# Patient Record
Sex: Female | Born: 1950
Health system: Southern US, Community
[De-identification: ages and names within clinical notes are randomized; demographics above are authoritative.]

## PROBLEM LIST (undated history)

## (undated) DIAGNOSIS — H9190 Unspecified hearing loss, unspecified ear: Secondary | ICD-10-CM

## (undated) DIAGNOSIS — Z86018 Personal history of other benign neoplasm: Secondary | ICD-10-CM

## (undated) DIAGNOSIS — J189 Pneumonia, unspecified organism: Secondary | ICD-10-CM

## (undated) DIAGNOSIS — K649 Unspecified hemorrhoids: Secondary | ICD-10-CM

## (undated) DIAGNOSIS — Z860100 Personal history of colon polyps, unspecified: Secondary | ICD-10-CM

## (undated) DIAGNOSIS — Z8669 Personal history of other diseases of the nervous system and sense organs: Secondary | ICD-10-CM

## (undated) DIAGNOSIS — J302 Other seasonal allergic rhinitis: Secondary | ICD-10-CM

## (undated) DIAGNOSIS — F419 Anxiety disorder, unspecified: Secondary | ICD-10-CM

## (undated) DIAGNOSIS — Z8719 Personal history of other diseases of the digestive system: Secondary | ICD-10-CM

## (undated) DIAGNOSIS — E559 Vitamin D deficiency, unspecified: Secondary | ICD-10-CM

## (undated) DIAGNOSIS — K5790 Diverticulosis of intestine, part unspecified, without perforation or abscess without bleeding: Secondary | ICD-10-CM

## (undated) DIAGNOSIS — I10 Essential (primary) hypertension: Secondary | ICD-10-CM

## (undated) DIAGNOSIS — M545 Low back pain, unspecified: Secondary | ICD-10-CM

## (undated) DIAGNOSIS — G9341 Metabolic encephalopathy: Secondary | ICD-10-CM

## (undated) DIAGNOSIS — N189 Chronic kidney disease, unspecified: Secondary | ICD-10-CM

## (undated) DIAGNOSIS — R51 Headache: Secondary | ICD-10-CM

## (undated) DIAGNOSIS — Z87440 Personal history of urinary (tract) infections: Secondary | ICD-10-CM

## (undated) DIAGNOSIS — Z973 Presence of spectacles and contact lenses: Secondary | ICD-10-CM

## (undated) DIAGNOSIS — K219 Gastro-esophageal reflux disease without esophagitis: Secondary | ICD-10-CM

## (undated) DIAGNOSIS — D649 Anemia, unspecified: Secondary | ICD-10-CM

## (undated) DIAGNOSIS — R413 Other amnesia: Secondary | ICD-10-CM

## (undated) DIAGNOSIS — Z8742 Personal history of other diseases of the female genital tract: Secondary | ICD-10-CM

## (undated) DIAGNOSIS — G4733 Obstructive sleep apnea (adult) (pediatric): Secondary | ICD-10-CM

## (undated) DIAGNOSIS — M1990 Unspecified osteoarthritis, unspecified site: Secondary | ICD-10-CM

## (undated) DIAGNOSIS — G8929 Other chronic pain: Secondary | ICD-10-CM

## (undated) DIAGNOSIS — M720 Palmar fascial fibromatosis [Dupuytren]: Secondary | ICD-10-CM

## (undated) HISTORY — PX: ABDOMINAL HYSTERECTOMY: SHX81

## (undated) HISTORY — DX: Anxiety disorder, unspecified: F41.9

## (undated) HISTORY — DX: Personal history of other diseases of the nervous system and sense organs: Z86.69

## (undated) HISTORY — PX: UPPER GI ENDOSCOPY: SHX6162

## (undated) HISTORY — PX: DENTAL SURGERY: SHX609

## (undated) HISTORY — PX: OTHER SURGICAL HISTORY: SHX169

## (undated) HISTORY — PX: FOOT SURGERY: SHX648

## (undated) HISTORY — DX: Personal history of other diseases of the female genital tract: Z87.42

## (undated) HISTORY — DX: Pneumonia, unspecified organism: J18.9

## (undated) HISTORY — DX: Anemia, unspecified: D64.9

## (undated) HISTORY — DX: Chronic kidney disease, unspecified: N18.9

## (undated) HISTORY — DX: Personal history of other benign neoplasm: Z86.018

## (undated) HISTORY — PX: BLADDER SUSPENSION: SHX72

## (undated) HISTORY — DX: Personal history of urinary (tract) infections: Z87.440

## (undated) HISTORY — PX: BACK SURGERY: SHX140

---

## 1999-12-17 ENCOUNTER — Encounter: Payer: Self-pay | Admitting: Gynecology

## 1999-12-17 ENCOUNTER — Encounter: Admission: RE | Admit: 1999-12-17 | Discharge: 1999-12-17 | Payer: Self-pay | Admitting: Gynecology

## 2000-05-28 ENCOUNTER — Inpatient Hospital Stay (HOSPITAL_COMMUNITY): Admission: EM | Admit: 2000-05-28 | Discharge: 2000-05-30 | Payer: Self-pay | Admitting: Internal Medicine

## 2000-05-28 ENCOUNTER — Encounter: Payer: Self-pay | Admitting: Emergency Medicine

## 2000-11-30 ENCOUNTER — Other Ambulatory Visit: Admission: RE | Admit: 2000-11-30 | Discharge: 2000-11-30 | Payer: Self-pay | Admitting: Gynecology

## 2001-05-25 ENCOUNTER — Encounter: Payer: Self-pay | Admitting: Gynecology

## 2001-05-25 ENCOUNTER — Encounter: Admission: RE | Admit: 2001-05-25 | Discharge: 2001-05-25 | Payer: Self-pay | Admitting: Gynecology

## 2002-01-31 ENCOUNTER — Other Ambulatory Visit: Admission: RE | Admit: 2002-01-31 | Discharge: 2002-01-31 | Payer: Self-pay | Admitting: Gynecology

## 2002-06-23 ENCOUNTER — Encounter: Payer: Self-pay | Admitting: Gynecology

## 2002-06-23 ENCOUNTER — Encounter: Admission: RE | Admit: 2002-06-23 | Discharge: 2002-06-23 | Payer: Self-pay | Admitting: Gynecology

## 2003-05-08 ENCOUNTER — Other Ambulatory Visit: Admission: RE | Admit: 2003-05-08 | Discharge: 2003-05-08 | Payer: Self-pay | Admitting: Gynecology

## 2003-08-03 ENCOUNTER — Encounter: Admission: RE | Admit: 2003-08-03 | Discharge: 2003-08-03 | Payer: Self-pay | Admitting: Gynecology

## 2004-07-22 ENCOUNTER — Other Ambulatory Visit: Admission: RE | Admit: 2004-07-22 | Discharge: 2004-07-22 | Payer: Self-pay | Admitting: Gynecology

## 2004-07-28 ENCOUNTER — Ambulatory Visit: Payer: Self-pay | Admitting: Internal Medicine

## 2004-09-02 ENCOUNTER — Encounter: Admission: RE | Admit: 2004-09-02 | Discharge: 2004-09-02 | Payer: Self-pay | Admitting: Gynecology

## 2005-04-10 ENCOUNTER — Ambulatory Visit: Payer: Self-pay | Admitting: Internal Medicine

## 2005-11-05 ENCOUNTER — Ambulatory Visit: Payer: Self-pay | Admitting: Internal Medicine

## 2006-02-04 ENCOUNTER — Other Ambulatory Visit: Admission: RE | Admit: 2006-02-04 | Discharge: 2006-02-04 | Payer: Self-pay | Admitting: Gynecology

## 2006-02-24 ENCOUNTER — Encounter: Admission: RE | Admit: 2006-02-24 | Discharge: 2006-02-24 | Payer: Self-pay | Admitting: Gynecology

## 2007-08-03 ENCOUNTER — Encounter: Payer: Self-pay | Admitting: *Deleted

## 2007-08-03 DIAGNOSIS — G43909 Migraine, unspecified, not intractable, without status migrainosus: Secondary | ICD-10-CM | POA: Insufficient documentation

## 2007-08-03 DIAGNOSIS — Z9189 Other specified personal risk factors, not elsewhere classified: Secondary | ICD-10-CM | POA: Insufficient documentation

## 2007-08-03 DIAGNOSIS — I10 Essential (primary) hypertension: Secondary | ICD-10-CM | POA: Insufficient documentation

## 2007-08-03 DIAGNOSIS — Z8619 Personal history of other infectious and parasitic diseases: Secondary | ICD-10-CM | POA: Insufficient documentation

## 2007-08-03 DIAGNOSIS — D259 Leiomyoma of uterus, unspecified: Secondary | ICD-10-CM | POA: Insufficient documentation

## 2007-08-03 DIAGNOSIS — F411 Generalized anxiety disorder: Secondary | ICD-10-CM | POA: Insufficient documentation

## 2007-08-03 DIAGNOSIS — F1021 Alcohol dependence, in remission: Secondary | ICD-10-CM | POA: Insufficient documentation

## 2007-08-03 DIAGNOSIS — Z87448 Personal history of other diseases of urinary system: Secondary | ICD-10-CM | POA: Insufficient documentation

## 2007-08-03 DIAGNOSIS — N809 Endometriosis, unspecified: Secondary | ICD-10-CM | POA: Insufficient documentation

## 2007-08-03 DIAGNOSIS — H9319 Tinnitus, unspecified ear: Secondary | ICD-10-CM | POA: Insufficient documentation

## 2007-09-12 ENCOUNTER — Encounter: Payer: Self-pay | Admitting: Internal Medicine

## 2007-10-18 ENCOUNTER — Encounter (INDEPENDENT_AMBULATORY_CARE_PROVIDER_SITE_OTHER): Payer: Self-pay | Admitting: *Deleted

## 2007-10-20 ENCOUNTER — Encounter: Payer: Self-pay | Admitting: Internal Medicine

## 2007-11-07 ENCOUNTER — Encounter: Payer: Self-pay | Admitting: Internal Medicine

## 2008-01-15 ENCOUNTER — Encounter: Payer: Self-pay | Admitting: Internal Medicine

## 2008-01-17 ENCOUNTER — Encounter: Payer: Self-pay | Admitting: Internal Medicine

## 2008-01-26 ENCOUNTER — Encounter: Payer: Self-pay | Admitting: Internal Medicine

## 2008-01-31 LAB — CONVERTED CEMR LAB: Pap Smear: NORMAL

## 2008-02-16 HISTORY — PX: COLONOSCOPY: SHX174

## 2008-04-02 ENCOUNTER — Encounter: Admission: RE | Admit: 2008-04-02 | Discharge: 2008-04-02 | Payer: Self-pay | Admitting: Gynecology

## 2008-04-02 ENCOUNTER — Ambulatory Visit: Payer: Self-pay | Admitting: Internal Medicine

## 2008-04-12 ENCOUNTER — Telehealth: Payer: Self-pay | Admitting: Internal Medicine

## 2008-04-22 ENCOUNTER — Encounter: Admission: RE | Admit: 2008-04-22 | Discharge: 2008-04-22 | Payer: Self-pay | Admitting: Orthopedic Surgery

## 2008-04-30 ENCOUNTER — Telehealth (INDEPENDENT_AMBULATORY_CARE_PROVIDER_SITE_OTHER): Payer: Self-pay | Admitting: *Deleted

## 2008-05-07 ENCOUNTER — Ambulatory Visit: Payer: Self-pay | Admitting: Internal Medicine

## 2008-05-07 ENCOUNTER — Telehealth: Payer: Self-pay | Admitting: Internal Medicine

## 2008-05-07 DIAGNOSIS — J069 Acute upper respiratory infection, unspecified: Secondary | ICD-10-CM | POA: Insufficient documentation

## 2008-05-08 ENCOUNTER — Encounter: Admission: RE | Admit: 2008-05-08 | Discharge: 2008-05-08 | Payer: Self-pay | Admitting: Internal Medicine

## 2008-05-10 ENCOUNTER — Telehealth: Payer: Self-pay | Admitting: Internal Medicine

## 2008-05-10 DIAGNOSIS — J984 Other disorders of lung: Secondary | ICD-10-CM | POA: Insufficient documentation

## 2008-05-23 ENCOUNTER — Ambulatory Visit: Payer: Self-pay | Admitting: Internal Medicine

## 2008-05-27 ENCOUNTER — Encounter: Payer: Self-pay | Admitting: Internal Medicine

## 2008-05-28 ENCOUNTER — Encounter: Payer: Self-pay | Admitting: Internal Medicine

## 2008-06-18 ENCOUNTER — Telehealth: Payer: Self-pay | Admitting: Internal Medicine

## 2008-07-16 ENCOUNTER — Ambulatory Visit (HOSPITAL_COMMUNITY): Admission: RE | Admit: 2008-07-16 | Discharge: 2008-07-16 | Payer: Self-pay | Admitting: Orthopedic Surgery

## 2008-09-10 ENCOUNTER — Telehealth: Payer: Self-pay | Admitting: Internal Medicine

## 2008-09-27 ENCOUNTER — Encounter: Admission: RE | Admit: 2008-09-27 | Discharge: 2008-09-27 | Payer: Self-pay | Admitting: Orthopedic Surgery

## 2008-11-23 ENCOUNTER — Encounter: Payer: Self-pay | Admitting: Internal Medicine

## 2008-12-25 ENCOUNTER — Telehealth: Payer: Self-pay | Admitting: Internal Medicine

## 2009-01-10 ENCOUNTER — Ambulatory Visit: Payer: Self-pay | Admitting: Internal Medicine

## 2009-01-14 ENCOUNTER — Encounter: Admission: RE | Admit: 2009-01-14 | Discharge: 2009-01-14 | Payer: Self-pay | Admitting: Internal Medicine

## 2009-01-16 ENCOUNTER — Encounter: Payer: Self-pay | Admitting: Internal Medicine

## 2009-01-18 ENCOUNTER — Telehealth: Payer: Self-pay | Admitting: Internal Medicine

## 2009-01-28 ENCOUNTER — Telehealth: Payer: Self-pay | Admitting: Internal Medicine

## 2009-01-30 ENCOUNTER — Ambulatory Visit: Payer: Self-pay | Admitting: Internal Medicine

## 2009-02-13 ENCOUNTER — Telehealth: Payer: Self-pay | Admitting: Internal Medicine

## 2009-02-18 ENCOUNTER — Telehealth: Payer: Self-pay | Admitting: Internal Medicine

## 2009-06-18 ENCOUNTER — Telehealth: Payer: Self-pay | Admitting: Internal Medicine

## 2009-08-15 ENCOUNTER — Ambulatory Visit: Payer: Self-pay | Admitting: Internal Medicine

## 2009-09-16 ENCOUNTER — Ambulatory Visit: Payer: Self-pay | Admitting: Internal Medicine

## 2009-09-18 ENCOUNTER — Telehealth: Payer: Self-pay | Admitting: Internal Medicine

## 2009-09-23 ENCOUNTER — Telehealth: Payer: Self-pay | Admitting: Internal Medicine

## 2009-09-26 ENCOUNTER — Ambulatory Visit: Payer: Self-pay | Admitting: Internal Medicine

## 2009-09-26 LAB — CONVERTED CEMR LAB
CO2: 34 meq/L — ABNORMAL HIGH (ref 19–32)
Chloride: 99 meq/L (ref 96–112)
Sodium: 142 meq/L (ref 135–145)

## 2009-09-27 ENCOUNTER — Ambulatory Visit: Payer: Self-pay | Admitting: Cardiology

## 2009-09-27 ENCOUNTER — Encounter: Payer: Self-pay | Admitting: Internal Medicine

## 2009-10-01 ENCOUNTER — Telehealth: Payer: Self-pay | Admitting: Internal Medicine

## 2009-10-03 ENCOUNTER — Encounter: Admission: RE | Admit: 2009-10-03 | Discharge: 2009-10-03 | Payer: Self-pay | Admitting: Internal Medicine

## 2009-10-04 ENCOUNTER — Telehealth: Payer: Self-pay | Admitting: Internal Medicine

## 2009-10-07 ENCOUNTER — Telehealth: Payer: Self-pay | Admitting: Internal Medicine

## 2009-10-14 ENCOUNTER — Encounter: Payer: Self-pay | Admitting: Internal Medicine

## 2009-10-21 ENCOUNTER — Encounter: Payer: Self-pay | Admitting: Internal Medicine

## 2009-10-22 ENCOUNTER — Ambulatory Visit (HOSPITAL_COMMUNITY): Admission: RE | Admit: 2009-10-22 | Discharge: 2009-10-22 | Payer: Self-pay | Admitting: Neurosurgery

## 2009-10-22 ENCOUNTER — Telehealth (INDEPENDENT_AMBULATORY_CARE_PROVIDER_SITE_OTHER): Payer: Self-pay | Admitting: *Deleted

## 2009-10-22 ENCOUNTER — Telehealth: Payer: Self-pay | Admitting: Internal Medicine

## 2009-10-23 ENCOUNTER — Inpatient Hospital Stay (HOSPITAL_COMMUNITY): Admission: RE | Admit: 2009-10-23 | Discharge: 2009-10-25 | Payer: Self-pay | Admitting: Neurosurgery

## 2009-11-07 ENCOUNTER — Telehealth: Payer: Self-pay | Admitting: Internal Medicine

## 2009-11-23 ENCOUNTER — Inpatient Hospital Stay (HOSPITAL_COMMUNITY): Admission: EM | Admit: 2009-11-23 | Discharge: 2009-11-26 | Payer: Self-pay | Admitting: Emergency Medicine

## 2009-11-23 ENCOUNTER — Encounter: Payer: Self-pay | Admitting: Internal Medicine

## 2009-11-23 ENCOUNTER — Ambulatory Visit: Payer: Self-pay | Admitting: Internal Medicine

## 2009-11-25 ENCOUNTER — Encounter: Payer: Self-pay | Admitting: Internal Medicine

## 2009-11-25 ENCOUNTER — Telehealth (INDEPENDENT_AMBULATORY_CARE_PROVIDER_SITE_OTHER): Payer: Self-pay | Admitting: *Deleted

## 2009-11-26 ENCOUNTER — Telehealth (INDEPENDENT_AMBULATORY_CARE_PROVIDER_SITE_OTHER): Payer: Self-pay | Admitting: *Deleted

## 2009-11-28 ENCOUNTER — Ambulatory Visit: Payer: Self-pay | Admitting: Internal Medicine

## 2009-11-28 ENCOUNTER — Telehealth: Payer: Self-pay | Admitting: Internal Medicine

## 2009-11-28 DIAGNOSIS — D649 Anemia, unspecified: Secondary | ICD-10-CM | POA: Insufficient documentation

## 2009-11-28 LAB — CONVERTED CEMR LAB
BUN: 20 mg/dL (ref 6–23)
Basophils Absolute: 0 10*3/uL (ref 0.0–0.1)
Chloride: 90 meq/L — ABNORMAL LOW (ref 96–112)
Glucose, Bld: 108 mg/dL — ABNORMAL HIGH (ref 70–99)
HCT: 33.6 % — ABNORMAL LOW (ref 36.0–46.0)
Lymphs Abs: 1.2 10*3/uL (ref 0.7–4.0)
MCHC: 35.2 g/dL (ref 30.0–36.0)
MCV: 92.9 fL (ref 78.0–100.0)
Monocytes Absolute: 0.8 10*3/uL (ref 0.1–1.0)
Platelets: 239 10*3/uL (ref 150.0–400.0)
Potassium: 3.3 meq/L — ABNORMAL LOW (ref 3.5–5.1)
RDW: 14.4 % (ref 11.5–14.6)

## 2009-12-02 ENCOUNTER — Ambulatory Visit: Payer: Self-pay | Admitting: Internal Medicine

## 2009-12-02 DIAGNOSIS — N183 Chronic kidney disease, stage 3 unspecified: Secondary | ICD-10-CM | POA: Insufficient documentation

## 2009-12-02 DIAGNOSIS — N2889 Other specified disorders of kidney and ureter: Secondary | ICD-10-CM | POA: Insufficient documentation

## 2009-12-02 DIAGNOSIS — R109 Unspecified abdominal pain: Secondary | ICD-10-CM | POA: Insufficient documentation

## 2009-12-02 DIAGNOSIS — N189 Chronic kidney disease, unspecified: Secondary | ICD-10-CM

## 2009-12-05 ENCOUNTER — Encounter: Payer: Self-pay | Admitting: Internal Medicine

## 2009-12-09 ENCOUNTER — Telehealth: Payer: Self-pay | Admitting: Internal Medicine

## 2009-12-11 ENCOUNTER — Ambulatory Visit: Payer: Self-pay | Admitting: Internal Medicine

## 2009-12-11 ENCOUNTER — Telehealth: Payer: Self-pay | Admitting: Internal Medicine

## 2009-12-11 LAB — CONVERTED CEMR LAB
Calcium: 9.4 mg/dL (ref 8.4–10.5)
GFR calc non Af Amer: 47.13 mL/min (ref >60)
Glucose, Bld: 81 mg/dL (ref 70–99)
Ketones, ur: NEGATIVE mg/dL
Leukocytes, UA: NEGATIVE
Sodium: 142 meq/L (ref 135–145)
Specific Gravity, Urine: >=1.03 (ref 1.000–1.030)
Urobilinogen, UA: 0.2 (ref 0.0–1.0)

## 2009-12-17 ENCOUNTER — Telehealth: Payer: Self-pay | Admitting: Internal Medicine

## 2009-12-24 ENCOUNTER — Encounter: Payer: Self-pay | Admitting: Internal Medicine

## 2010-01-23 ENCOUNTER — Encounter: Payer: Self-pay | Admitting: Internal Medicine

## 2010-02-05 ENCOUNTER — Telehealth: Payer: Self-pay | Admitting: Internal Medicine

## 2010-02-12 ENCOUNTER — Encounter: Payer: Self-pay | Admitting: Internal Medicine

## 2010-02-13 ENCOUNTER — Telehealth: Payer: Self-pay | Admitting: Internal Medicine

## 2010-03-19 ENCOUNTER — Encounter: Admission: RE | Admit: 2010-03-19 | Discharge: 2010-03-19 | Payer: Self-pay | Admitting: Gynecology

## 2010-05-12 ENCOUNTER — Telehealth (INDEPENDENT_AMBULATORY_CARE_PROVIDER_SITE_OTHER): Payer: Self-pay | Admitting: *Deleted

## 2010-05-14 ENCOUNTER — Ambulatory Visit
Admission: RE | Admit: 2010-05-14 | Discharge: 2010-05-14 | Payer: Self-pay | Source: Home / Self Care | Attending: Internal Medicine | Admitting: Internal Medicine

## 2010-05-14 DIAGNOSIS — I872 Venous insufficiency (chronic) (peripheral): Secondary | ICD-10-CM | POA: Insufficient documentation

## 2010-05-25 ENCOUNTER — Encounter: Payer: Self-pay | Admitting: Internal Medicine

## 2010-05-29 ENCOUNTER — Encounter: Payer: Self-pay | Admitting: Internal Medicine

## 2010-05-30 ENCOUNTER — Encounter
Admission: RE | Admit: 2010-05-30 | Discharge: 2010-05-30 | Payer: Self-pay | Source: Home / Self Care | Attending: Neurosurgery | Admitting: Neurosurgery

## 2010-06-03 NOTE — Progress Notes (Signed)
  Phone Note Other Incoming   Request: Send information Summary of Call: Request for records received from [facility]. Request forwarded to [facility].     

## 2010-06-03 NOTE — Progress Notes (Signed)
Summary: REQ A CALL FROM MD  Phone Note Call from Patient Call back at Home Phone [phone] Call back at Work Phone [phone] Call back at [phone]   Summary of Call: Patient is requesting a call from Dr Debby Bud, c/o continued back pain.  Initial call taken by: Lamar Sprinkles, CMA,  Oct 01, 2009 2:11 PM  Follow-up for Phone Call        called patient - still in a lot of pain. I told her we were working on getting the MRI scheduled.  Follow-up by: Jacques Navy MD,  Oct 01, 2009 6:19 PM  Additional Follow-up for Phone Call Additional follow up Details #1::        Pt scheduled for MRI per [facility] at 3:15 today Additional Follow-up by: Lamar Sprinkles, CMA,  October 02, 2009 2:51 PM

## 2010-06-03 NOTE — Letter (Signed)
Summary: [facility]  [facility]   Imported By: Sherian Rein 02/22/2010 10:14:35  _____________________________________________________________________  External Attachment:    Type:   Image     Comment:   External Document

## 2010-06-03 NOTE — Consult Note (Signed)
Summary: [facility]  [facility]   Imported By: Sherian Rein 10/30/2009 13:41:06  _____________________________________________________________________  External Attachment:    Type:   Image     Comment:   External Document

## 2010-06-03 NOTE — Progress Notes (Signed)
Summary: FYI--Mass  Phone Note Call from Patient Call back at Home Phone [phone] Call back at Work Phone [phone] Call back at [phone]   Summary of Call: Patient left message on triage with FYI that she has back surgery tomorrow and while doing pre-op testing for the surgery a mass was found in her lung. Patient notes that she has done additional testing due to the mass, but is unaware of the results at this time. Patient would like Sharnee Douglass to have access to this information and is willing to sign release if needed. Initial call taken by: Lucious Groves,  October 22, 2009 10:55 AM  Follow-up for Phone Call        reveiwed CT chest - no nodule to correspond to finding on chest x-ray. No new nodules or abnormalities noted. Not too worry Follow-up by: Jacques Navy MD,  October 22, 2009 1:24 PM  Additional Follow-up for Phone Call Additional follow up Details #1::        Pt informed, Dr Cassandria Santee office is aware of normal results also and they will contact pt to confirm surgery tomorrow. Additional Follow-up by: Lamar Sprinkles, CMA,  October 22, 2009 1:38 PM

## 2010-06-03 NOTE — Assessment & Plan Note (Signed)
Summary: BACK PAIN,MEN PT/WILL SEE ANOTHER MD WANTS TO BE SEEN/CD   Vital Signs:  Patient profile:   60 year old female Height:      64.5 inches Weight:      144.13 pounds BMI:     24.45 O2 Sat:      96 % on Room air Temp:     97.8 degrees F oral Pulse rate:   72 / minute BP sitting:   122 / 80  (left arm) Cuff size:   regular  Vitals Entered ByZella Ball Ewing (August 15, 2009 9:21 AM)  O2 Flow:  Room air CC: Back Pain/RE   Primary Care Provider:  Jacques Navy MD  CC:  Back Pain/RE.  History of Present Illness: here with known lumbar disc disease and mild spinal stenosis;  last Sun apr 10 lifted her grand hild 20 lbs  (7 mo old)  out of a car seat out of a car;p did not feel any problem at the time, and walked fine that day; but Mon apr 11 had onset severe pain, lower back bilat but more worse on the right than the left;  assoc with constant ache as well to the right buttock and thigh;  no pain below the knee;  no numbness, no weakness , no falls, no change in bowel or bladder, no fever, chills, no night sweats, no wt loss. No injury , or falls.  Supposed to leave for vacation on sunday next (flying).   Pt denies CP, sob, doe, wheezing, orthopnea, pnd, worsening LE edema, palps, dizziness or syncope .   Problems Prior to Update: 1)  Lumbar Radiculopathy, Right  (ICD-724.4) 2)  Pulmonary Nodule  (ICD-518.89) 3)  Uri  (ICD-465.9) 4)  Hip Pain, Right  (ICD-719.45) 5)  Tinnitus, Left  (ICD-388.30) 6)  Anxiety  (ICD-300.00) 7)  Hypertension  (ICD-401.9) 8)  Uti's, Hx of  (ICD-V13.00) 9)  Alcohol Abuse, Hx of  (ICD-V11.3) 10)  Migraine Headache  (ICD-346.90) 11)  Measles, Hx of  (ICD-V12.09) 12)  Chickenpox, Hx of  (ICD-V15.9) 13)  Hx of Endometriosis  (ICD-617.9) 14)  Hx of Fibroids, Uterus  (ICD-218.9) 15)  Hx of Tah and Bilateral Salpingo-oophorectomy  () 16)  Hx of Laparoscopy For Evaluation of Endometriosis  ()  Medications Prior to Update: 1)  Paroxetine Hcl 40 Mg   Tabs (Paroxetine Hcl) .... Take Two Times A Day 2)  Atenolol 100 Mg  Tabs (Atenolol) .... Take Once Daily 3)  Vitamin C 250 Mg  Tabs (Ascorbic Acid) .... Take Once Daily As Needed 4)  Omega-3 1000 Mg Caps (Omega-3 Fatty Acids) .... Take 1 Tablet By Mouth Once A Day 5)  Glucosamine Complex   Tabs (Nutritional Supplements) .... Take Daily As Directed. 6)  Imitrex 100 Mg  Tabs (Sumatriptan Succinate) .... Take As Needed and As Directed 7)  Excedrin Migraine 250-250-65 Mg  Tabs (Aspirin-Acetaminophen-Caffeine) .... Take As Needed and As Directed. 8)  Multivitamins   Tabs (Multiple Vitamin) .... Take 1 Tablet By Mouth Once A Day 9)  Vitamin D 1000 Unit Caps (Cholecalciferol) .... Take 1 Tablet By Mouth Once A Day 10)  Zinc 50 Mg Tabs (Zinc) .... Take 1 Tablet By Mouth Once A Day 11)  Evamist (Estrogen) .... One Spray Daily 12)  Calcium-Magnesium 500-250 Mg Tabs (Calcium-Magnesium) .... Take 1 Tablet By Mouth Once A Day 13)  Vitamin B-12 250 Mcg Tabs (Cyanocobalamin) .... Take 1 Tablet By Mouth Once A Day 14)  Mucinex 600 Mg  Xr12h-Tab (Guaifenesin) .... Take 2 Tablet By Mouth Two Times A Day 15)  Diovan Hct 320-25 Mg Tabs (Valsartan-Hydrochlorothiazide) .[name].. 1 Once Daily 16)  Promethazine-Codeine 6.25-10 Mg/58ml Syrp (Promethazine-Codeine) .[name].. 1 Tsp Q 6 17)  Diclofenac Sodium 50 Mg Tbec (Diclofenac Sodium) .... One By Mouth Qid With Food As Needed For Lbp 18)  Nucynta 75 Mg Tabs (Tapentadol Hcl) .... One By Mouth Qid As Needed For Lbp Fill On or After 07/16/2008  Current Medications (verified): 1)  Paroxetine Hcl 40 Mg  Tabs (Paroxetine Hcl) .... Take Two Times A Day 2)  Atenolol 100 Mg  Tabs (Atenolol) .... Take Once Daily 3)  Vitamin C 250 Mg  Tabs (Ascorbic Acid) .... Take Once Daily As Needed 4)  Omega-3 1000 Mg Caps (Omega-3 Fatty Acids) .... Take 1 Tablet By Mouth Once A Day 5)  Glucosamine Complex   Tabs (Nutritional Supplements) .... Take Daily As Directed. 6)  Imitrex 100 Mg  Tabs  (Sumatriptan Succinate) .... Take As Needed and As Directed 7)  Excedrin Migraine 250-250-65 Mg  Tabs (Aspirin-Acetaminophen-Caffeine) .... Take As Needed and As Directed. 8)  Multivitamins   Tabs (Multiple Vitamin) .... Take 1 Tablet By Mouth Once A Day 9)  Vitamin D 1000 Unit Caps (Cholecalciferol) .... Take 1 Tablet By Mouth Once A Day 10)  Zinc 50 Mg Tabs (Zinc) .... Take 1 Tablet By Mouth Once A Day 11)  Evamist (Estrogen) .... One Spray Daily 12)  Calcium-Magnesium 500-250 Mg Tabs (Calcium-Magnesium) .... Take 1 Tablet By Mouth Once A Day 13)  Vitamin B-12 250 Mcg Tabs (Cyanocobalamin) .... Take 1 Tablet By Mouth Once A Day 14)  Mucinex 600 Mg Xr12h-Tab (Guaifenesin) .... Take 2 Tablet By Mouth Two Times A Day 15)  Diovan Hct 320-25 Mg Tabs (Valsartan-Hydrochlorothiazide) .[name].. 1 Once Daily 16)  Promethazine-Codeine 6.25-10 Mg/64ml Syrp (Promethazine-Codeine) .[name].. 1 Tsp Q 6 17)  Diclofenac Sodium 50 Mg Tbec (Diclofenac Sodium) .... One By Mouth Qid With Food As Needed For Lbp 18)  Oxycodone Hcl 5 Mg Caps (Oxycodone Hcl) .[name].. 1 -2 By Mouth Q 6 Hrs As Needed Pain 19)  Flexeril 5 Mg Tabs (Cyclobenzaprine Hcl) .[name].. 1 By Mouth Three Times A Day As Needed 20)  Prednisone 10 Mg Tabs (Prednisone) .... 3po Qd For 3days, Then 2po Qd For 3days, Then 1po Qd For 3days, Then Stop  Allergies (verified): 1)  ! Penicillin  Past History:  Past Medical History: Last updated: 08/03/2007 TINNITUS, LEFT (ICD-388.30) ANXIETY (ICD-300.00) HYPERTENSION (ICD-401.9) UTI'S, HX OF (ICD-V13.00) ALCOHOL ABUSE, HX OF (ICD-V11.3) MIGRAINE HEADACHE (ICD-346.90) MEASLES, HX OF (ICD-V12.09) CHICKENPOX, HX OF (ICD-V15.9) Hx of ENDOMETRIOSIS (ICD-617.9) Hx of FIBROIDS, UTERUS (ICD-218.9)  Past Surgical History: Last updated: 04/02/2008 * Hx of TAH AND BILATERAL SALPINGO-OOPHORECTOMY * Hx of LAPAROSCOPY FOR EVALUATION OF ENDOMETRIOSIS  G3P2  1 SAB    Social History: Last updated: 05/07/2008 [employer]- BS  interior design Married '79-20 years/divorced, has remained single 1 daughter '84 -lawyer; 1 son -'67 patient lives alone Denies anyhistory or physical or sexual abuse.  Risk Factors: Exercise: yes (04/02/2008)  Risk Factors: Smoking Status: never (08/03/2007)  Review of Systems       all otherwise negative per pt -    Physical Exam  General:  alert and well-developed.   Head:  normocephalic and atraumatic.   Eyes:  vision grossly intact, pupils equal, and pupils round.   Ears:  R ear normal and L ear normal.   Nose:  no external deformity and no  nasal discharge.   Mouth:  no gingival abnormalities and pharynx pink and moist.   Neck:  supple and no masses.   Lungs:  normal respiratory effort and normal breath sounds.   Heart:  normal rate and regular rhythm.   Abdomen:  soft, non-tender, and normal bowel sounds.   Msk:  no spine tender midline or paravertebral swelling;  has marked tender to the right buttock / SI joint area Extremities:  no edema, no erythema  Neurologic:  cranial nerves II-XII intact, strength normal in all lower extremities, and sensation intact to light touch.  cranial nerves II-XII intact, strength normal in all extremities, sensation intact to light touch, gait normal, and DTRs symmetrical and normal.     Impression & Recommendations:  Problem # 1:  LUMBAR RADICULOPATHY, RIGHT (ICD-724.4)  Her updated medication list for this problem includes:    Excedrin Migraine 250-250-65 Mg Tabs (Aspirin-acetaminophen-caffeine) .[name]... Take as needed and as directed.    Diclofenac Sodium 50 Mg Tbec (Diclofenac sodium) ..... One by mouth qid with food as needed for lbp    Oxycodone Hcl 5 Mg Caps (Oxycodone hcl) .[name]... 1 -2 by mouth q 6 hrs as needed pain    Flexeril 5 Mg Tabs (Cyclobenzaprine hcl) .[name]... 1 by mouth three times a day as needed with recurrent pain, exam stable,  for pain med/control, flexeril as needed, and pred pack; to f/u any worsening  symptoms  Problem # 2:  HYPERTENSION (ICD-401.9)  Her updated medication list for this problem includes:    Atenolol 100 Mg Tabs (Atenolol) .[name]... Take once daily    Diovan Hct 320-25 Mg Tabs (Valsartan-hydrochlorothiazide) .[name]... 1 once daily  BP today: 122/80 Prior BP: 118/78 (01/10/2009) stable overall by hx and exam, ok to continue meds/tx as is   Complete Medication List: 1)  Paroxetine Hcl 40 Mg Tabs (Paroxetine hcl) .... Take two times a day 2)  Atenolol 100 Mg Tabs (Atenolol) .... Take once daily 3)  Vitamin C 250 Mg Tabs (Ascorbic acid) .... Take once daily as needed 4)  Omega-3 1000 Mg Caps (Omega-3 fatty acids) .... Take 1 tablet by mouth once a day 5)  Glucosamine Complex Tabs (Nutritional supplements) .... Take daily as directed. 6)  Imitrex 100 Mg Tabs (Sumatriptan succinate) .... Take as needed and as directed 7)  Excedrin Migraine 250-250-65 Mg Tabs (Aspirin-acetaminophen-caffeine) .... Take as needed and as directed. 8)  Multivitamins Tabs (Multiple vitamin) .... Take 1 tablet by mouth once a day 9)  Vitamin D 1000 Unit Caps (Cholecalciferol) .... Take 1 tablet by mouth once a day 10)  Zinc 50 Mg Tabs (Zinc) .... Take 1 tablet by mouth once a day 11)  Evamist (estrogen)  .... One spray daily 12)  Calcium-magnesium 500-250 Mg Tabs (Calcium-magnesium) .... Take 1 tablet by mouth once a day 13)  Vitamin B-12 250 Mcg Tabs (Cyanocobalamin) .... Take 1 tablet by mouth once a day 14)  Mucinex 600 Mg Xr12h-tab (Guaifenesin) .... Take 2 tablet by mouth two times a day 15)  Diovan Hct 320-25 Mg Tabs (Valsartan-hydrochlorothiazide) .[name].. 1 once daily 16)  Promethazine-codeine 6.25-10 Mg/4ml Syrp (Promethazine-codeine) .[name].. 1 tsp q 6 17)  Diclofenac Sodium 50 Mg Tbec (Diclofenac sodium) .... One by mouth qid with food as needed for lbp 18)  Oxycodone Hcl 5 Mg Caps (Oxycodone hcl) .[name].. 1 -2 by mouth q 6 hrs as needed pain 19)  Flexeril 5 Mg Tabs (Cyclobenzaprine hcl) .[name].. 1 by mouth  three times a day  as needed 20)  Prednisone 10 Mg Tabs (Prednisone) .... 3po qd for 3days, then 2po qd for 3days, then 1po qd for 3days, then stop  Patient Instructions: 1)  Please take all new medications as prescribed 2)  Continue all previous medications as before this visit  3)  Please schedule an appointment with your primary doctor as needed Prescriptions: PREDNISONE 10 MG TABS (PREDNISONE) 3po qd for 3days, then 2po qd for 3days, then 1po qd for 3days, then stop  #18 x 0   Entered and Authorized by:   Corwin Levins MD   Signed by:   Corwin Levins MD on 08/15/2009   Method used:   Print then Give to Patient   RxID:   [id number] FLEXERIL 5 MG TABS (CYCLOBENZAPRINE HCL) 1 by mouth three times a day as needed  #60 x 0   Entered and Authorized by:   Corwin Levins MD   Signed by:   Corwin Levins MD on 08/15/2009   Method used:   Print then Give to Patient   RxID:   [id number] OXYCODONE HCL 5 MG CAPS (OXYCODONE HCL) 1 -2 by mouth q 6 hrs as needed pain  #60 x 0   Entered and Authorized by:   Corwin Levins MD   Signed by:   Corwin Levins MD on 08/15/2009   Method used:   Print then Give to Patient   RxID:   [id number]

## 2010-06-03 NOTE — Letter (Signed)
Summary: [facility]  [facility]   Imported By: Sherian Rein 10/30/2009 14:02:48  _____________________________________________________________________  External Attachment:    Type:   Image     Comment:   External Document

## 2010-06-03 NOTE — Progress Notes (Signed)
  Phone Note Refill Request Message from:  Fax from Pharmacy on October 07, 2009 8:30 AM  Refills Requested: Medication #1:  PAROXETINE HCL 40 MG  TABS Take two times a day Initial call taken by: Ami Bullins CMA,  October 07, 2009 8:30 AM    Prescriptions: PAROXETINE HCL 40 MG  TABS (PAROXETINE HCL) Take two times a day  #180 x 3   Entered by:   Ami Bullins CMA   Authorized by:   Jacques Navy MD   Signed by:   Bill Salinas CMA on 10/07/2009   Method used:   [facility]        [facility] [address] [facility] [address] [phone]* (retail)       [address]       [place], [place]  [address]       Ph: [phone] or [phone]       Fax: [phone]   RxID:   [id number]

## 2010-06-03 NOTE — Progress Notes (Signed)
  Phone Note Other Incoming   Request: Send information Summary of Call: Request for records received from [facility]. Request &  POA forwarded to [facility].

## 2010-06-03 NOTE — Progress Notes (Signed)
Summary: atenolol  Phone Note Refill Request Message from:  Fax from Pharmacy on Sep 18, 2009 11:23 AM  Refills Requested: Medication #1:  ATENOLOL 100 MG  TABS Take once daily # 90   Last Refilled: 07/18/2009  Method Requested: Electronic Initial call taken by: Orlan Leavens,  Sep 18, 2009 11:23 AM    Prescriptions: ATENOLOL 100 MG  TABS (ATENOLOL) Take once daily  #90 x 3   Entered by:   Orlan Leavens   Authorized by:   Jacques Navy MD   Signed by:   Orlan Leavens on 09/18/2009   Method used:   [facility]        [facility] [facility] [address] [phone]* (retail)       [address]       [place], [place]  [address]       Ph: [phone] or [phone]       Fax: [phone]   RxID:   [id number]

## 2010-06-03 NOTE — Progress Notes (Signed)
Summary: TRAZODONE  Phone Note Call from Patient Call back at Home Phone [phone] Call back at [phone]   Summary of Call: Patient is requesting a call back regarding info that we will be recieving from Dr Jeanie Sewer.  Initial call taken by: Lamar Sprinkles, CMA,  February 13, 2010 2:29 PM  Follow-up for Phone Call        Pt was at Dr Redding's office for a 5:30 apt and it was after 6:30 and MD still had another patient infront of her. She did not want to wait and left the office.   Patient is requesting that Dr Debby Bud take over prescribing trazodone? She has already signed a release for her records from Dr Jeanie Sewer to be transferred to Dr Debby Bud.  Follow-up by: Lamar Sprinkles, CMA,  February 14, 2010 3:21 PM  Additional Follow-up for Phone Call Additional follow up Details #1::        OK Additional Follow-up by: Jacques Navy MD,  February 14, 2010 5:45 PM    Additional Follow-up for Phone Call Additional follow up Details #2::    Patient notified and rx sent in.[name] CMA  February 17, 2010 4:30 PM   Prescriptions: TRAZODONE HCL 100 MG TABS (TRAZODONE HCL) 1 tab at bedtime  #30 x 5   Entered by:   Rock Nephew CMA   Authorized by:   Jacques Navy MD   Signed by:   Rock Nephew CMA on 02/17/2010   Method used:   [facility]        [facility] [address] [phone]* (retail)       [address]       [place], [place]  [address]       Ph: [phone] or [phone]       Fax: [phone]   RxID:   [id number]

## 2010-06-03 NOTE — Progress Notes (Signed)
Summary: lab results  Phone Note Outgoing Call   Reason for Call: Discuss lab or test results Summary of Call: please call patient. Creatinine is 1.2 normal range but still up a little from labs in May when creatinine was 0.7. Also, U/A wth specific gravity of 1.030 consistent with mild dehydration.  Initial call taken by: Jacques Navy MD,  December 17, 2009 1:58 PM  Follow-up for Phone Call        tried to call pt with no answer on either numbers. will try back later Follow-up by: Ami Bullins CMA,  December 17, 2009 2:13 PM  Additional Follow-up for Phone Call Additional follow up Details #1::        Any recommendations for the patient/ Additional Follow-up by: Lucious Groves CMA,  December 23, 2009 1:30 PM    Additional Follow-up for Phone Call Additional follow up Details #2::    no further recommendations at this time. She will need follow-up lab in 3 weeks.: metabolic panel 584.9 Follow-up by: Jacques Navy MD,  December 23, 2009 4:58 PM  Additional Follow-up for Phone Call Additional follow up Details #3:: Details for Additional Follow-up Action Taken: we have tried to contact the pt several times. I have mailed her a letter with this info. Additional Follow-up by: Ami Bullins CMA,  December 24, 2009 9:09 AM

## 2010-06-03 NOTE — Letter (Signed)
Summary: [facility]  [facility]   Imported By: Lennie Odor 02/13/2010 14:51:42  _____________________________________________________________________  External Attachment:    Type:   Image     Comment:   External Document

## 2010-06-03 NOTE — Assessment & Plan Note (Signed)
Summary: POST HOSP-PER FLAG/DR MEN-STC   Vital Signs:  Patient profile:   60 year old female Height:      64.5 inches Weight:      143 pounds BMI:     24.25 O2 Sat:      94 % on Room air Temp:     98.2 degrees F oral Pulse rate:   60 / minute BP sitting:   96 / 62  (left arm) Cuff size:   regular  Vitals Entered By: Bill Salinas CMA (December 02, 2009 11:08 AM)  O2 Flow:  Room air CC: pt here for hosp follow up/ ab Comments pt is no longer taking Atenolol, promethazine, diovan or prednisone/ ab   Primary Care Provider:  Jacques Navy MD  CC:  pt here for hosp follow up/ ab.  History of Present Illness: Patient presents for hospital follosw-up.She had  been admitted with acute renal failure with Cr 5.4 from a baseline of 0.7. She did well with hydration. Her post-hospital lab July 28th was 1.5.  She continues to have right groin, hip and buttock pain with radiation to the leg. She had MRI with normal L5-S1, disk disease at L2-3 which was treated with foraminotomy  along with L3-4 diskectomy. She had an MRI right hip in '09 that was normal and consultation with Dr. Charlann Boxer with no orthopedic problem at the right hip. She has had colonoscopy. She is s/;p TAH/BSO. Her discomfort is manageable on her current regimen of gabapentin 300mg  three times a day and oxycodone 5mg  q 6 as needed.  Patient c/o dyspepsia that is not relieved by standard doses of otc H2 blockers.   Current Medications (verified): 1)  Paroxetine Hcl 40 Mg  Tabs (Paroxetine Hcl) .... Take Two Times A Day 2)  Atenolol 100 Mg  Tabs (Atenolol) .... Take Once Daily 3)  Vitamin C 250 Mg  Tabs (Ascorbic Acid) .... Take Once Daily As Needed 4)  Omega-3 1000 Mg Caps (Omega-3 Fatty Acids) .... Take 1 Tablet By Mouth Once A Day 5)  Glucosamine Complex   Tabs (Nutritional Supplements) .... Take Daily As Directed. 6)  Imitrex 100 Mg  Tabs (Sumatriptan Succinate) .[name].. 1 Once Daily As Needed Migraine 7)  Excedrin Migraine  250-250-65 Mg  Tabs (Aspirin-Acetaminophen-Caffeine) .... Take As Needed and As Directed. 8)  Multivitamins   Tabs (Multiple Vitamin) .... Take 1 Tablet By Mouth Once A Day 9)  Vitamin D 1000 Unit Caps (Cholecalciferol) .... Take 1 Tablet By Mouth Once A Day 10)  Zinc 50 Mg Tabs (Zinc) .... Take 1 Tablet By Mouth Once A Day 11)  Evamist (Estrogen) .... One Spray Daily 12)  Calcium-Magnesium 500-250 Mg Tabs (Calcium-Magnesium) .... Take 1 Tablet By Mouth Once A Day 13)  Vitamin B-12 250 Mcg Tabs (Cyanocobalamin) .... Take 1 Tablet By Mouth Once A Day 14)  Mucinex 600 Mg Xr12h-Tab (Guaifenesin) .... Take 2 Tablet By Mouth Two Times A Day 15)  Diovan Hct 320-25 Mg Tabs (Valsartan-Hydrochlorothiazide) .[name].. 1 Once Daily 16)  Promethazine-Codeine 6.25-10 Mg/25ml Syrp (Promethazine-Codeine) .[name].. 1 Tsp Q 6 17)  Oxycodone Hcl 5 Mg Caps (Oxycodone Hcl) .[name].. 1 By Mouth Q 6 Hrs As Needed Severe Pain 18)  Flexeril 5 Mg Tabs (Cyclobenzaprine Hcl) .[name].. 1 By Mouth Three Times A Day As Needed 19)  Prednisone 10 Mg Tabs (Prednisone) .... 3po Qd For 3days, Then 2po Qd For 3days, Then 1po Qd For 3days, Then Stop 20)  Prednisone 10 Mg Tabs (Prednisone) .Marland KitchenMarland KitchenMarland Kitchen  4 Tabs X 1, 3 Tabs Once Daily X 3, 2 Tabs Once Daily X 3, 1 Tab Once Daily X 6 21)  Oxycodone-Acetaminophen 5-325 Mg Tabs (Oxycodone-Acetaminophen) .[name].. 1 or 2 Every 6 Hours As Needed For Pain. 22)  Gabapentin 300 Mg Caps (Gabapentin) .[name].. 1 Three Times A Day 23)  Metoprolol Tartrate 50 Mg Tabs (Metoprolol Tartrate) .[name].. 1 Tab Two Times A Day 24)  Diltiazem Hcl Er Beads 180 Mg Xr24h-Cap (Diltiazem Hcl Er Beads) .[name].. 1 Cap Once Daily 25)  Lorazepam 0.5 Mg Tabs (Lorazepam) .[name].. 1 Tab As Needed 26)  Simvastatin 40 Mg Tabs (Simvastatin) .... 1/2 Tab Once Daily 27)  Trazodone Hcl 100 Mg Tabs (Trazodone Hcl) .[name].. 1 Tab At Bedtime  Allergies (verified): 1)  ! Penicillin  Past History:  Past Medical History:  INGUINAL PAIN, RIGHT (ICD-789.09) Hx of RENAL FAILURE,  ACUTE (ICD-584.9) UNSPECIFIED ANEMIA (ICD-285.9) PULMONARY NODULE (ICD-518.89) URI (ICD-465.9) TINNITUS, LEFT (ICD-388.30) ANXIETY (ICD-300.00) HYPERTENSION (ICD-401.9) UTI'S, HX OF (ICD-V13.00) ALCOHOL ABUSE, HX OF (ICD-V11.3) MIGRAINE HEADACHE (ICD-346.90) MEASLES, HX OF (ICD-V12.09) CHICKENPOX, HX OF (ICD-V15.9) Hx of ENDOMETRIOSIS (ICD-617.9) Hx of FIBROIDS, UTERUS (ICD-218.9) * Hx of TAH AND BILATERAL SALPINGO-OOPHORECTOMY * Hx of LAPAROSCOPY FOR EVALUATION OF ENDOMETRIOSIS  Past Surgical History: * Hx of TAH AND BILATERAL SALPINGO-OOPHORECTOMY * Hx of LAPAROSCOPY FOR EVALUATION OF ENDOMETRIOSIS L2-3 foraminotomy 'Sp '11 L3-4 diskectomy 'Sp '11  G3P2  1 SAB    Family History: Reviewed history from 05/07/2008 and no changes required. father - deceased @ 63: plane crash mohter- deceasaed @ 54: emphysema, CAD/MI Neg - colon or breast cancer, lipids, DM  Social History: Reviewed history from 05/07/2008 and no changes required. [employer]- BS interior design Married '79-20 years/divorced, has remained single 1 daughter '84 -lawyer; 1 son -'72 patient lives alone Denies anyhistory or physical or sexual abuse.  Physical Exam  General:  WNWD white female in no acute distress and seems to be comfortable on exam.  Head:  Normocephalic and atraumatic without obvious abnormalities. No apparent alopecia or balding. Neck:  supple and full ROM.   Lungs:  normal respiratory effort and normal breath sounds.   Heart:  normal rate and regular rhythm.   Msk:  no joint swelling, no joint warmth, and no redness over joints.   Neurologic:  alert & oriented X3, cranial nerves II-XII intact, and gait normal.   Skin:  turgor normal and color normal.   Psych:  Oriented X3, memory intact for recent and remote, normally interactive, and good eye contact.     Impression & Recommendations:  Problem # 1:  Hx of RENAL FAILURE, ACUTE (ICD-584.9) Patient is recovering with last creatinine July  28th of 1.5, baseline of 0.7  Plan - continue to hydrate           follow-up lab August 8th  Problem # 2:  INGUINAL PAIN, RIGHT (ICD-789.09) Patient with on-going pain with a negative work-up. Concern for musculo-skeletal origin, i.e. piriformis syndrome or other.  Plan - may continue present meds           Sports medicine referral.   The following medications were removed from the medication list:    Flexeril 5 Mg Tabs (Cyclobenzaprine hcl) .[name]... 1 by mouth three times a day as needed    Oxycodone-acetaminophen 5-325 Mg Tabs (Oxycodone-acetaminophen) .[name]... 1 or 2 every 6 hours as needed for pain. Her updated medication list for this problem includes:    Excedrin Migraine 250-250-65 Mg Tabs (Aspirin-acetaminophen-caffeine) .[name]... Take as needed and  as directed.    Oxycodone Hcl 5 Mg Caps (Oxycodone hcl) .[name]... 1 by mouth q 6 hrs as needed severe pain  Orders: Sports Medicine (Sports Med)  Complete Medication List: 1)  Paroxetine Hcl 40 Mg Tabs (Paroxetine hcl) .... Take two times a day 2)  Atenolol 100 Mg Tabs (Atenolol) .... Take once daily 3)  Vitamin C 250 Mg Tabs (Ascorbic acid) .... Take once daily as needed 4)  Omega-3 1000 Mg Caps (Omega-3 fatty acids) .... Take 1 tablet by mouth once a day 5)  Imitrex 100 Mg Tabs (Sumatriptan succinate) .[name].. 1 once daily as needed migraine 6)  Excedrin Migraine 250-250-65 Mg Tabs (Aspirin-acetaminophen-caffeine) .... Take as needed and as directed. 7)  Multivitamins Tabs (Multiple vitamin) .... Take 1 tablet by mouth once a day 8)  Vitamin D 1000 Unit Caps (Cholecalciferol) .... Take 1 tablet by mouth once a day 9)  Evamist (estrogen)  .... One spray daily 10)  Calcium-magnesium 500-250 Mg Tabs (Calcium-magnesium) .... Take 1 tablet by mouth once a day 11)  Vitamin B-12 250 Mcg Tabs (Cyanocobalamin) .... Take 1 tablet by mouth once a day 12)  Mucinex 600 Mg Xr12h-tab (Guaifenesin) .... Take 2 tablet by mouth two times a day 13)  Diovan Hct  320-25 Mg Tabs (Valsartan-hydrochlorothiazide) .[name].. 1 once daily 14)  Oxycodone Hcl 5 Mg Caps (Oxycodone hcl) .[name].. 1 by mouth q 6 hrs as needed severe pain 15)  Gabapentin 300 Mg Caps (Gabapentin) .[name].. 1 three times a day 16)  Metoprolol Tartrate 50 Mg Tabs (Metoprolol tartrate) .[name].. 1 tab two times a day 17)  Diltiazem Hcl Er Beads 180 Mg Xr24h-cap (Diltiazem hcl er beads) .[name].. 1 cap once daily 18)  Lorazepam 0.5 Mg Tabs (Lorazepam) .[name].. 1 tab as needed 19)  Simvastatin 40 Mg Tabs (Simvastatin) .... 1/2 tab once daily 20)  Trazodone Hcl 100 Mg Tabs (Trazodone hcl) .[name].. 1 tab at bedtime 21)  Pantoprazole Sodium 40 Mg Tbec (Pantoprazole sodium) .[name].. 1 by mouth qam  Patient Instructions: 1)  Groin and buttock pain - a this point we need to look at musculo-skeletal causes of pain. Plan - refer to sports medicine: either Kerin Perna, MD or if he has no appointments Dr. Roanna Epley. Continue present medications. 2)  Dyspepsia- will start generic Protonix once a day before breakfast. 3)  Renal failure - doing better. Will recheck lab Monday August 8th.  Prescriptions: PANTOPRAZOLE SODIUM 40 MG TBEC (PANTOPRAZOLE SODIUM) 1 by mouth qAM  #30 x 12   Entered and Authorized by:   Jacques Navy MD   Signed by:   Jacques Navy MD on 12/02/2009   Method used:   [facility]        [facility] [address] [phone]* (retail)       [address]       [place], [place]  [address]       Ph: [phone] or [phone]       Fax: [phone]   RxID:   [id number]

## 2010-06-03 NOTE — Letter (Signed)
Summary: Medication/[facility]  Medication/[facility]   Imported By: Sherian Rein 02/22/2010 10:16:24  _____________________________________________________________________  External Attachment:    Type:   Image     Comment:   External Document

## 2010-06-03 NOTE — Assessment & Plan Note (Signed)
Summary: back pain  stc   Vital Signs:  Patient profile:   60 year old female Height:      64.5 inches Weight:      145 pounds BMI:     24.59 O2 Sat:      94 % on Room air Temp:     98.4 degrees F oral Pulse rate:   105 / minute BP sitting:   138 / 90  (left arm) Cuff size:   regular  Vitals Entered By: Bill Salinas CMA (Sep 16, 2009 9:47 AM)  O2 Flow:  Room air CC: pt here with c/o low back pain/ ab   Primary Care Provider:  Jacques Navy MD  CC:  pt here with c/o low back pain/ ab.  History of Present Illness: Patient presents for low back pain. She was seen about a month ago for this problem. Over the past 10 days the pain has gotten much worse. It is hard to sleep. Sitting is better than standing, laying down is also painful. Icing helps. No paresthesia, no loss of control of  bowel or bladder. Minimal radiation to leg. She does get relief from the combination of diclofenac and oxycodone.   Reviewed MRI from Sept '10 - DDD with L2-3 disk more to the right foramen. L3-4 central disk protrusion.  Reviewed Dr. Melvyn Novas note from april 14th - non-focal exam but dx was lumbar radiculopathy.  Current Medications (verified): 1)  Paroxetine Hcl 40 Mg  Tabs (Paroxetine Hcl) .... Take Two Times A Day 2)  Atenolol 100 Mg  Tabs (Atenolol) .... Take Once Daily 3)  Vitamin C 250 Mg  Tabs (Ascorbic Acid) .... Take Once Daily As Needed 4)  Omega-3 1000 Mg Caps (Omega-3 Fatty Acids) .... Take 1 Tablet By Mouth Once A Day 5)  Glucosamine Complex   Tabs (Nutritional Supplements) .... Take Daily As Directed. 6)  Imitrex 100 Mg  Tabs (Sumatriptan Succinate) .... Take As Needed and As Directed 7)  Excedrin Migraine 250-250-65 Mg  Tabs (Aspirin-Acetaminophen-Caffeine) .... Take As Needed and As Directed. 8)  Multivitamins   Tabs (Multiple Vitamin) .... Take 1 Tablet By Mouth Once A Day 9)  Vitamin D 1000 Unit Caps (Cholecalciferol) .... Take 1 Tablet By Mouth Once A Day 10)  Zinc 50 Mg Tabs  (Zinc) .... Take 1 Tablet By Mouth Once A Day 11)  Evamist (Estrogen) .... One Spray Daily 12)  Calcium-Magnesium 500-250 Mg Tabs (Calcium-Magnesium) .... Take 1 Tablet By Mouth Once A Day 13)  Vitamin B-12 250 Mcg Tabs (Cyanocobalamin) .... Take 1 Tablet By Mouth Once A Day 14)  Mucinex 600 Mg Xr12h-Tab (Guaifenesin) .... Take 2 Tablet By Mouth Two Times A Day 15)  Diovan Hct 320-25 Mg Tabs (Valsartan-Hydrochlorothiazide) .[name].. 1 Once Daily 16)  Promethazine-Codeine 6.25-10 Mg/16ml Syrp (Promethazine-Codeine) .[name].. 1 Tsp Q 6 17)  Diclofenac Sodium 50 Mg Tbec (Diclofenac Sodium) .... One By Mouth Qid With Food As Needed For Lbp 18)  Oxycodone Hcl 5 Mg Caps (Oxycodone Hcl) .[name].. 1 -2 By Mouth Q 6 Hrs As Needed Pain 19)  Flexeril 5 Mg Tabs (Cyclobenzaprine Hcl) .[name].. 1 By Mouth Three Times A Day As Needed 20)  Prednisone 10 Mg Tabs (Prednisone) .... 3po Qd For 3days, Then 2po Qd For 3days, Then 1po Qd For 3days, Then Stop  Allergies (verified): 1)  ! Penicillin PMH-FH-SH reviewed-no changes except otherwise noted  Review of Systems  The patient denies anorexia, fever, weight loss, weight gain, chest pain, syncope,  dyspnea on exertion, abdominal pain, muscle weakness, difficulty walking, depression, and enlarged lymph nodes.    Physical Exam  General:  Well-developed,well-nourished,in no acute distress; alert,appropriate and cooperative throughout examination Head:  normocephalic.   Eyes:  vision grossly intact, pupils equal, and pupils round.   Neck:  supple and full ROM.   Lungs:  normal respiratory effort and normal breath sounds.   Heart:  normal rate and regular rhythm.   Msk:  back exam: nl stand, flex to 30 degrees, atalgic gait, able to toe/heel walk, step -up with pain on the left, SLR sitting was normal, DTRs at patellar tendon normal. Tender to palpation over the left SI joint.  Skin:  turgor normal and color normal.   Cervical Nodes:  no anterior cervical adenopathy.   Psych:   Oriented X3, memory intact for recent and remote, and good eye contact.     Impression & Recommendations:  Problem # 1:  SACROILIAC JOINT DYSFUNCTION (ICD-724.6) Exam is negative for radiculopathy. Discomfort is greatest over the SI joint left.  Plan - steroid burst and taper           OK for massage therapy           muscle relaxant as needed           application of heat           APAP           call for progressive symptoms             Complete Medication List: 1)  Paroxetine Hcl 40 Mg Tabs (Paroxetine hcl) .... Take two times a day 2)  Atenolol 100 Mg Tabs (Atenolol) .... Take once daily 3)  Vitamin C 250 Mg Tabs (Ascorbic acid) .... Take once daily as needed 4)  Omega-3 1000 Mg Caps (Omega-3 fatty acids) .... Take 1 tablet by mouth once a day 5)  Glucosamine Complex Tabs (Nutritional supplements) .... Take daily as directed. 6)  Imitrex 100 Mg Tabs (Sumatriptan succinate) .... Take as needed and as directed 7)  Excedrin Migraine 250-250-65 Mg Tabs (Aspirin-acetaminophen-caffeine) .... Take as needed and as directed. 8)  Multivitamins Tabs (Multiple vitamin) .... Take 1 tablet by mouth once a day 9)  Vitamin D 1000 Unit Caps (Cholecalciferol) .... Take 1 tablet by mouth once a day 10)  Zinc 50 Mg Tabs (Zinc) .... Take 1 tablet by mouth once a day 11)  Evamist (estrogen)  .... One spray daily 12)  Calcium-magnesium 500-250 Mg Tabs (Calcium-magnesium) .... Take 1 tablet by mouth once a day 13)  Vitamin B-12 250 Mcg Tabs (Cyanocobalamin) .... Take 1 tablet by mouth once a day 14)  Mucinex 600 Mg Xr12h-tab (Guaifenesin) .... Take 2 tablet by mouth two times a day 15)  Diovan Hct 320-25 Mg Tabs (Valsartan-hydrochlorothiazide) .[name].. 1 once daily 16)  Promethazine-codeine 6.25-10 Mg/52ml Syrp (Promethazine-codeine) .[name].. 1 tsp q 6 17)  Diclofenac Sodium 50 Mg Tbec (Diclofenac sodium) .... One by mouth qid with food as needed for lbp 18)  Oxycodone Hcl 5 Mg Caps (Oxycodone hcl) .[name].. 1 -2  by mouth q 6 hrs as needed pain 19)  Flexeril 5 Mg Tabs (Cyclobenzaprine hcl) .[name].. 1 by mouth three times a day as needed 20)  Prednisone 10 Mg Tabs (Prednisone) .... 3po qd for 3days, then 2po qd for 3days, then 1po qd for 3days, then stop 21)  Prednisone 10 Mg Tabs (Prednisone) .... 4 tabs x 1, 3 tabs once daily x 3,  2 tabs once daily x 3, 1 tab once daily x 6  Patient Instructions: 1)   back pain - based on exam and review of exising information the pain seems to be at the left sacro-iliac joint. There is no evidence of a radiculopathy = nerve compression. Plan - OK for massage therapy and to get exercise guidance; prednisone - 40mg  once daily x 1, 30mg  once daily x 3, 20mg  once daily x 3, 10 mg once daily x 6; flexeril as needed for muscle spasm; ice or heat to the SI joint area. May take tylenol (generic) 1000mg  three times a day as supplmental pain medication. For loss of sensation, increasing pain, weakness in the muscles - call for reevaluation.  Prescriptions: FLEXERIL 5 MG TABS (CYCLOBENZAPRINE HCL) 1 by mouth three times a day as needed  #60 x 0   Entered and Authorized by:   Jacques Navy MD   Signed by:   Jacques Navy MD on 09/16/2009   Method used:   [facility]        [facility] [address] [phone]* (retail)       [address]       [place], [place]  [address]       Ph: [phone] or [phone]       Fax: [phone]   RxID:   [id number] PREDNISONE 10 MG TABS (PREDNISONE) 4 tabs x 1, 3 tabs once daily x 3, 2 tabs once daily x 3, 1 tab once daily x 6  #25 x 0   Entered and Authorized by:   Jacques Navy MD   Signed by:   Jacques Navy MD on 09/16/2009   Method used:   [facility]        [facility] [address] [phone]* (retail)       [address]       [place], [place]  [address]       Ph: [phone] or [phone]       Fax: [phone]   RxID:   [id number]

## 2010-06-03 NOTE — Progress Notes (Signed)
Summary: STRESS TEST  Phone Note Call from Patient Call back at Home Phone [phone] Call back at [phone]   Summary of Call: Pt wants to know if she needs a stress test? Is this a normal preventative screening that she needs to have a certian age? No symptom complaints. She is possibly going to lose her insurance at the end of august. Aware MD is out of the office untill next week.     Initial call taken by: Lamar Sprinkles, CMA,  November 07, 2009 3:03 PM  Follow-up for Phone Call        No stress test is needed for routine screening in the absence of symptoms Follow-up by: Jacques Navy MD,  November 09, 2009 4:10 PM  Additional Follow-up for Phone Call Additional follow up Details #1::        Patient notified/ LM on personal VM.Marland KitchenMarland KitchenAlvy Beal Archie CMA  November 11, 2009 8:58 AM     New/Updated Medications: IMITREX 100 MG  TABS (SUMATRIPTAN SUCCINATE) 1 once daily as needed migraine Prescriptions: IMITREX 100 MG  TABS (SUMATRIPTAN SUCCINATE) 1 once daily as needed migraine  #9 x 6   Entered by:   Lamar Sprinkles, CMA   Authorized by:   Jacques Navy MD   Signed by:   Lamar Sprinkles, CMA on 11/07/2009   Method used:   [facility] to        [facility] [address] [phone]* (retail)       [address]       [place], [place]  [address]       Ph: [phone] or [phone]       Fax: [phone]   RxID:   [id number]

## 2010-06-03 NOTE — Progress Notes (Signed)
Summary: Kidneys  Phone Note Call from Patient Call back at Home Phone [phone] Call back at [phone]   Summary of Call: Pt continues to c/o pain and is concerned about her kidneys. Should she have repeat labs to check kidney function?  Initial call taken by: Lamar Sprinkles, CMA,  December 09, 2009 9:39 AM  Follow-up for Phone Call        office note reviewed: indicated she is to have repeat metabolic panel today.  584.9 Follow-up by: Jacques Navy MD,  December 09, 2009 9:41 AM  Additional Follow-up for Phone Call Additional follow up Details #1::        Pt informed, also needs copy of xray done at hospital for PT at Dr Cassandria Santee office. Advised her to call hosptial and inquire about this. Additional Follow-up by: Lamar Sprinkles, CMA,  December 09, 2009 9:47 AM

## 2010-06-03 NOTE — Miscellaneous (Signed)
Summary: Orders Update   Clinical Lists Changes  Orders: Added new Referral order of Radiology Referral (Radiology) - Signed 

## 2010-06-03 NOTE — Progress Notes (Signed)
Summary: APPT--POST HOSP  ---- Converted from flag ---- ---- 11/26/2009 7:26 AM, Jacques Navy MD wrote: good morning,  1. hospital follow-up appointment for Monday or Tuesday. Call patient after 1200 2. Lab for Thursday , 7/28: metabolic 401.9, CBC 285.9  Thanks ------------------------------  Gave pt appt/phone:  12/02/09 @ 1110A w/Dr Debby Bud

## 2010-06-03 NOTE — Progress Notes (Signed)
Summary: MRI RESULTS - Need Referral order   Phone Note From Other Clinic   Summary of Call: Per MD: MRI results recieved, will refer to neurosurgeon for eval, possible epidural injection as treatment. Please put in referral. She is continuing to use pain medication and would like referral asap.  Initial call taken by: Lamar Sprinkles, CMA,  October 04, 2009 5:02 PM  Follow-up for Phone Call        MRI report reviewed - no acute injury. Reviewed exam on 5/26 - no radicular findings.  Plan refer to NS for eval for possible epidural steroid injection. Follow-up by: Jacques Navy MD,  October 04, 2009 10:51 PM

## 2010-06-03 NOTE — Letter (Signed)
Summary: Generic Letter  [facility]  [address] [place], [place] [address]   Phone: [phone]  Fax: [phone]    12/24/2009  [facility] [address] [place], [place]  [address]  Dear Ms. MCGLADE,  We have tried to contact you several times unsuccessfully. Your lab results are as follows:   please call patient. Creatinine is 1.2 normal range but still up a little from labs in May when creatinine was 0.7. Also, U/A wth specific gravity of 1.030 consistent with mild dehydration.    We will need to repeat your labs 3 weeks from Dec 17, 2009. The lab appointment will be put in and you can come at any time that is convenient for you. If you have any questions please call our office [phone].   Sincerely,   Ami Bullins CMA

## 2010-06-03 NOTE — Assessment & Plan Note (Signed)
Summary: BACK PAIN/ INJECTION? /NWS   Vital Signs:  Patient profile:   60 year old female Height:      64.5 inches Weight:      143 pounds BMI:     24.25 O2 Sat:      95 % on Room air Temp:     97.8 degrees F oral Pulse rate:   75 / minute BP sitting:   110 / 62  (left arm) Cuff size:   regular  Vitals Entered By: Bill Salinas CMA (Sep 26, 2009 4:23 PM)  O2 Flow:  Room air CC: pt here with c/o back pain/ ab   Primary Care Provider:  Jacques Navy MD  CC:  pt here with c/o back pain/ ab.  History of Present Illness: Patient returns for persistent and severe pain which has been in the low back. Chart reivewed: she was seen by Dr. Jonny Ruiz who diagnosed a lumbar radculopathy. I saw her subseqently and thought she had SI dysfunction. Dr. Jonny Ruiz had prescribed analgesics and steroids. I extended the steroids. She returns reporting that the steroids have had no effect and the pain is worse. She states she cannot stand it any more.  On closer questioning she does report that she gets a grabbing, intense pain in the left lower quadrant abdomen along with the back pain which is now across the low back but worse on the left. She is s/p TAH/BSO. She has had no weight loss, change in bowel habit, F/S/C.   Current Medications (verified): 1)  Paroxetine Hcl 40 Mg  Tabs (Paroxetine Hcl) .... Take Two Times A Day 2)  Atenolol 100 Mg  Tabs (Atenolol) .... Take Once Daily 3)  Vitamin C 250 Mg  Tabs (Ascorbic Acid) .... Take Once Daily As Needed 4)  Omega-3 1000 Mg Caps (Omega-3 Fatty Acids) .... Take 1 Tablet By Mouth Once A Day 5)  Glucosamine Complex   Tabs (Nutritional Supplements) .... Take Daily As Directed. 6)  Imitrex 100 Mg  Tabs (Sumatriptan Succinate) .... Take As Needed and As Directed 7)  Excedrin Migraine 250-250-65 Mg  Tabs (Aspirin-Acetaminophen-Caffeine) .... Take As Needed and As Directed. 8)  Multivitamins   Tabs (Multiple Vitamin) .... Take 1 Tablet By Mouth Once A Day 9)  Vitamin  D 1000 Unit Caps (Cholecalciferol) .... Take 1 Tablet By Mouth Once A Day 10)  Zinc 50 Mg Tabs (Zinc) .... Take 1 Tablet By Mouth Once A Day 11)  Evamist (Estrogen) .... One Spray Daily 12)  Calcium-Magnesium 500-250 Mg Tabs (Calcium-Magnesium) .... Take 1 Tablet By Mouth Once A Day 13)  Vitamin B-12 250 Mcg Tabs (Cyanocobalamin) .... Take 1 Tablet By Mouth Once A Day 14)  Mucinex 600 Mg Xr12h-Tab (Guaifenesin) .... Take 2 Tablet By Mouth Two Times A Day 15)  Diovan Hct 320-25 Mg Tabs (Valsartan-Hydrochlorothiazide) .[name].. 1 Once Daily 16)  Promethazine-Codeine 6.25-10 Mg/4ml Syrp (Promethazine-Codeine) .[name].. 1 Tsp Q 6 17)  Diclofenac Sodium 50 Mg Tbec (Diclofenac Sodium) .... One By Mouth Qid With Food As Needed For Lbp 18)  Oxycodone Hcl 5 Mg Caps (Oxycodone Hcl) .[name].. 1 -2 By Mouth Q 6 Hrs As Needed Pain 19)  Flexeril 5 Mg Tabs (Cyclobenzaprine Hcl) .[name].. 1 By Mouth Three Times A Day As Needed 20)  Prednisone 10 Mg Tabs (Prednisone) .... 3po Qd For 3days, Then 2po Qd For 3days, Then 1po Qd For 3days, Then Stop 21)  Prednisone 10 Mg Tabs (Prednisone) .... 4 Tabs X 1, 3 Tabs Once  Daily X 3, 2 Tabs Once Daily X 3, 1 Tab Once Daily X 6  Allergies (verified): 1)  ! Penicillin PMH-FH-SH reviewed-no changes except otherwise noted  Review of Systems       The patient complains of abdominal pain and difficulty walking.  The patient denies anorexia, fever, weight loss, weight gain, vision loss, chest pain, syncope, dyspnea on exertion, prolonged cough, headaches, melena, hematochezia, hematuria, muscle weakness, and enlarged lymph nodes.    Physical Exam  General:  WNWD well groomed white female who is uncomfortable. Head:  normocephalic and atraumatic.   Eyes:  pupils equal, pupils round, and corneas and lenses clear.   Lungs:  normal respiratory effort and normal breath sounds.   Heart:  normal rate and regular rhythm.   Abdomen:  soft, normal bowel sounds, no distention, no masses, no guarding,  no rigidity, and no hepatomegaly.  Very tender to palpation in the lower left quadrant. No mass noted. No peritoneal sign or rebound. Msk:  back - able to stand with difficulty and she stays in a bent over position for 30-60 seconds; normal flex, hindered gait by pain, able to toe/heel walk. Able to step up to the exam table without assist. Nl SLR sitting. Nl DTR at patellar tendon. Tender to palpation along the low back and over SI joints left > right. Pulses:  2+ radial Neurologic:  alert & oriented X3 and cranial nerves II-XII intact.   Skin:  turgor normal and color normal.   Psych:  Oriented X3, memory intact for recent and remote, normally interactive, and good eye contact.     Impression & Recommendations:  Problem # 1:  ABDOMINAL PAIN, LEFT LOWER QUADRANT (ICD-789.04)  On exam and by history the dominant problem is LLQ abdominal pain. She does have back tenderness but a non-focal back exam without radicular findings. MRI of L-S spine done in Sept '10 does reveal DDD. Greater concern for intra-abdominal process: adhesions, low volvulus, mass.  Plan - CT abdomen/pelvis with contrast Friday5/ 27.           hydrocodone/APAP 5/325 1 or 2 q 6 for pain.  Orders: Radiology Referral (Radiology)  Complete Medication List: 1)  Paroxetine Hcl 40 Mg Tabs (Paroxetine hcl) .... Take two times a day 2)  Atenolol 100 Mg Tabs (Atenolol) .... Take once daily 3)  Vitamin C 250 Mg Tabs (Ascorbic acid) .... Take once daily as needed 4)  Omega-3 1000 Mg Caps (Omega-3 fatty acids) .... Take 1 tablet by mouth once a day 5)  Glucosamine Complex Tabs (Nutritional supplements) .... Take daily as directed. 6)  Imitrex 100 Mg Tabs (Sumatriptan succinate) .... Take as needed and as directed 7)  Excedrin Migraine 250-250-65 Mg Tabs (Aspirin-acetaminophen-caffeine) .... Take as needed and as directed. 8)  Multivitamins Tabs (Multiple vitamin) .... Take 1 tablet by mouth once a day 9)  Vitamin D 1000 Unit Caps  (Cholecalciferol) .... Take 1 tablet by mouth once a day 10)  Zinc 50 Mg Tabs (Zinc) .... Take 1 tablet by mouth once a day 11)  Evamist (estrogen)  .... One spray daily 12)  Calcium-magnesium 500-250 Mg Tabs (Calcium-magnesium) .... Take 1 tablet by mouth once a day 13)  Vitamin B-12 250 Mcg Tabs (Cyanocobalamin) .... Take 1 tablet by mouth once a day 14)  Mucinex 600 Mg Xr12h-tab (Guaifenesin) .... Take 2 tablet by mouth two times a day 15)  Diovan Hct 320-25 Mg Tabs (Valsartan-hydrochlorothiazide) .[name].. 1 once daily 16)  Promethazine-codeine 6.25-10 Mg/33ml  Syrp (Promethazine-codeine) .[name].. 1 tsp q 6 17)  Oxycodone Hcl 5 Mg Caps (Oxycodone hcl) .[name].. 1 -2 by mouth q 6 hrs as needed pain 18)  Flexeril 5 Mg Tabs (Cyclobenzaprine hcl) .[name].. 1 by mouth three times a day as needed 19)  Prednisone 10 Mg Tabs (Prednisone) .... 3po qd for 3days, then 2po qd for 3days, then 1po qd for 3days, then stop 20)  Prednisone 10 Mg Tabs (Prednisone) .... 4 tabs x 1, 3 tabs once daily x 3, 2 tabs once daily x 3, 1 tab once daily x 6 21)  Oxycodone-acetaminophen 5-325 Mg Tabs (Oxycodone-acetaminophen) .[name].. 1 or 2 every 6 hours as needed for pain.  Other Orders: Admin of Therapeutic Inj  intramuscular or subcutaneous (04540) Ketorolac-Toradol 15mg  (J8119) TLB-BMP (Basic Metabolic Panel-BMET) (80048-METABOL) Prescriptions: OXYCODONE-ACETAMINOPHEN 5-325 MG TABS (OXYCODONE-ACETAMINOPHEN) 1 or 2 every 6 hours as needed for pain.  #60 x 0   Entered and Authorized by:   Jacques Navy MD   Signed by:   Jacques Navy MD on 09/27/2009   Method used:   Handwritten   RxID:   [id number]    Medication Administration  Injection # 1:    Medication: Ketorolac-Toradol 15mg     Diagnosis: LOW BACK PAIN SYNDROME, SEVERE (ICD-724.2)    Route: IM    Site: LUOQ gluteus    Exp Date: 09/02/2010    Lot #: [id number]    Mfr: Hospira    Patient tolerated injection without complications    Given by: Ami Bullins CMA  (Sep 26, 2009 4:52 PM)  Orders Added: 1)  Radiology Referral [Radiology] 2)  Admin of Therapeutic Inj  intramuscular or subcutaneous [96372] 3)  Ketorolac-Toradol 15mg  [J1885] 4)  TLB-BMP (Basic Metabolic Panel-BMET) [80048-METABOL] 5)  Radiology Referral [Radiology] 6)  Est. Patient Level IV [52841]

## 2010-06-03 NOTE — Progress Notes (Signed)
Summary: U/A  Phone Note Call from Patient   Summary of Call: Patient walked into office. She is having more problems w/pain on right side of hip & back. She is scheduled for f/u w/[facility] in 2 wks. Hydrocodone qid is managing her pain for now. She has started physical therapy this week also. Pt did not do scheduled f/u labs Monday. She also c/o come blood in her urine. Added u/a to BMP that was already scheduled. Advised for her to f/u with change in symptoms if she was not able to see [facility] sooner.   Hold phone note for u/a results.  Initial call taken by: Lamar Sprinkles, CMA,  December 11, 2009 3:38 PM  Follow-up for Phone Call        Results ready, please advise.  Follow-up by: Lamar Sprinkles, CMA,  December 11, 2009 5:27 PM  Additional Follow-up for Phone Call Additional follow up Details #1::        1. kidney function better - creatinine 1.2 is improved, not at her baseline of 0.7. No additional treatment needed. 2. U/A - negative. No treatment Additional Follow-up by: Jacques Navy MD,  December 12, 2009 1:04 PM    Additional Follow-up for Phone Call Additional follow up Details #2::    Pt informed  Follow-up by: Lamar Sprinkles, CMA,  December 12, 2009 5:40 PM

## 2010-06-03 NOTE — Progress Notes (Signed)
Summary: Med ?   Phone Note Call from Patient Call back at [facility] Phone [phone]   Summary of Call: Patient is requesting a call back, she had back surgery and would like to discuss medications.  Initial call taken by: Lamar Sprinkles, CMA,  February 05, 2010 2:34 PM  Follow-up for Phone Call        Spoke w/patient. She had back surgery and had questions regarding her medication list.   Pt's pain regimen is: diazepam 5 mg 1 q 6 hrs as needed  oxycodone 15mg  1 q 8 hrs as needed pain oxy/apap 10/325 for breakthru as needed  lyrica 50mg  three times a day as needed  gabpentin 300mg  three times a day    Needs refills on: Simvastatin Atenolol Diltiazem  1.Pt no longer takes lorazepam - OK for remove from EMR?  2.OK for refills of above requested?  3.Pt has some diclofenac 50mg  1 qid w/food for pain (previously prescribed by Dr Yetta Barre). Can she take this along w/her other pain meds? If yes, ok to refill? 4. She does not take Diovan HCT that is on her med list. She remembers Dr Debby Bud stopping this medication but does not know why. Also - last filled was 09/2009 and she has not been taking the medication.   FYI - Pt wants MD to know she trys to limit the amount of pain medications that she takes.  Follow-up by: Lamar Sprinkles, CMA,  February 06, 2010 3:22 PM  Additional Follow-up for Phone Call Additional follow up Details #1::        called back. msg machine. left msg - pain regimen looks OK. Better to discuss in person if needed - add on Friday 10/07 is ok.  ok to refill simvastatin, atenolol and diltiazem. aware of ccb and simvatatin - she is taking an accepted ccb dose of 20mg  daily Additional Follow-up by: Jacques Navy MD,  February 06, 2010 6:48 PM    Additional Follow-up for Phone Call Additional follow up Details #2::    Pt informed, she wanted to make sure that MD was aware of meds she is taking Follow-up by: Lamar Sprinkles, CMA,  February 07, 2010 5:47 PM  New/Updated  Medications: OXYCODONE HCL 15 MG TABS (OXYCODONE HCL) 1 every 8 hours as needed pain -managed by [facility] OXYCODONE-ACETAMINOPHEN 10-325 MG TABS (OXYCODONE-ACETAMINOPHEN) for breakthru pain - prescribed by [facility] DIAZEPAM 5 MG TABS (DIAZEPAM) 1 every 6 hours as needed Prescriptions: DILTIAZEM HCL ER BEADS 180 MG XR24H-CAP (DILTIAZEM HCL ER BEADS) 1 cap once daily  #90 x 1   Entered by:   Lamar Sprinkles, CMA   Authorized by:   Jacques Navy MD   Signed by:   Lamar Sprinkles, CMA on 02/07/2010   Method used:   Electronically to        [facility] [facility] [address] [phone]* (retail)       [address]       [place], [place]  [address]       Ph: [phone] or [phone]       Fax: [phone]   RxID:   [id number] ATENOLOL 100 MG  TABS (ATENOLOL) Take once daily  #90 x 1   Entered by:   Lamar Sprinkles, CMA   Authorized by:   Jacques Navy MD   Signed by:   Lamar Sprinkles, CMA on 02/07/2010   Method used:   [facility] to  [facility] [facility] [address] [phone]* (retail)       [address]       [place], [place]  [address]       Ph: [phone] or [phone]       Fax: [phone]   RxID:   [id number] SIMVASTATIN 40 MG TABS (SIMVASTATIN) 1/2 tab once daily  #45 x 1   Entered by:   Lamar Sprinkles, CMA   Authorized by:   Jacques Navy MD   Signed by:   Lamar Sprinkles, CMA on 02/07/2010   Method used:   [facility]ron[facility]        [facility] [facility] [address] [phone]* (retail)       [address]       [place], [place]  [address]       Ph: [phone] or [phone]       Fax: [phone]   RxID:   [id number]

## 2010-06-03 NOTE — Progress Notes (Signed)
Summary: RX's  Phone Note Call from Patient Call back at [facility] Phone [phone] Call back at [phone]   Summary of Call: 1.Pt wants a call about oxycodone rx for pain to go along w/other rx she is taking for leg pain. ( Per MD, patient was to get original rx from ortho and he would refill) 2. Wants to know about glucosamine, is it an rx? or otc? She said she had trouble finding at store.  Initial call taken by: Lamar Sprinkles, CMA,  November 28, 2009 10:27 AM  Follow-up for Phone Call        left mess that glucosamine is for joints and is OTC she should check w/pharmacist for help. Also, MD advised he could refill oxycodone but ortho was to give original rx................Marland KitchenLamar Sprinkles, CMA  November 28, 2009 11:46 AM   Per pt, she had talked to MD about oxycodone & oxycontin, she does not know difference. Just understands that maybe one lasted longer?  She has no rx's for oxycodone or oxycontin at this time. Last rx was for 7.5/325 1 q 4 hrs from Dr Jeral Fruit. Pt is taking gabpentin 300mg  1 three times a day.  Follow-up by: Lamar Sprinkles, CMA,  November 28, 2009 4:05 PM  Additional Follow-up for Phone Call Additional follow up Details #1::        1. Patient may use oxycodone 5mg  q 6 hrs as needed for severe pain.  2. Oxycontin is long acting oxycodone and hopefully she doesn't need chronic narcotic coverage 3. She was started on gabapentin in hospital to take three times a day for help with pain management - abdominal pain with no identified etiology 4. Glucosamine is fine to take and is OTC and does not have drug interactions to worry about 5. Her labs - Cr to 1.5, improving. She needs to continue to hydrate. Hgb is stable. 6. She needs follow-up office visit at which time we will reassess her condition with the intent of PT referral.  Additional Follow-up by: Jacques Navy MD,  November 28, 2009 4:29 PM    Additional Follow-up for Phone Call Additional follow up Details #2::    There are 2  oxycodone meds listed on her EMR list. What one is correct? Last given from our office was the percocet. Please also include the qty that is ok?............Marland KitchenLamar Sprinkles, CMA  November 28, 2009 6:23 PM   Per MD, oxy 5mg  #40...........Marland KitchenLamar Sprinkles, CMA  November 29, 2009 3:21 PM   Pt informed of above..................Marland KitchenLamar Sprinkles, CMA  November 29, 2009 5:07 PM  Follow-up by: Jacques Navy MD,  November 30, 2009 12:20 PM  New/Updated Medications: OXYCODONE HCL 5 MG CAPS (OXYCODONE HCL) 1 by mouth q 6 hrs as needed severe pain GABAPENTIN 300 MG CAPS (GABAPENTIN) 1 three times a day Prescriptions: OXYCODONE HCL 5 MG CAPS (OXYCODONE HCL) 1 by mouth q 6 hrs as needed severe pain  #40 x 0   Entered by:   Lamar Sprinkles, CMA   Authorized by:   Tresa Garter MD   Signed by:   Lamar Sprinkles, CMA on 11/29/2009   Method used:   Print then Give to Patient   RxID:   [id number]

## 2010-06-03 NOTE — Letter (Signed)
Summary: [facility]  [facility]   Imported By: Lester Kimball 12/20/2009 10:22:52  _____________________________________________________________________  External Attachment:    Type:   Image     Comment:   External Document

## 2010-06-03 NOTE — Progress Notes (Signed)
Summary: REFILL - Nucynta  Phone Note Call from Patient Call back at [phone]   Summary of Call: Patient is requesting refill of nucynta. She tripped on a rug yesterday and fell. This has aggravated her ongoing back issues.   Initial call taken by: Lamar Sprinkles, CMA,  June 18, 2009 10:24 AM  Follow-up for Phone Call        OK to renew nucynta 75mg  qid, #120 with 1 refill. Follow-up by: Jacques Navy MD,  June 18, 2009 10:59 AM  Additional Follow-up for Phone Call Additional follow up Details #1::        left mess to call office back....................Marland KitchenLamar Sprinkles, CMA  June 18, 2009 12:10 PM     Additional Follow-up for Phone Call Additional follow up Details #2::    Pt informed to pick up rx Follow-up by: Lamar Sprinkles, CMA,  June 18, 2009 1:34 PM  New/Updated Medications: NUCYNTA 75 MG TABS (TAPENTADOL HCL) One by mouth QID as needed for LBP Fill on or after 06/18/2008 NUCYNTA 75 MG TABS (TAPENTADOL HCL) One by mouth QID as needed for LBP Fill on or after 07/16/2008 Prescriptions: NUCYNTA 75 MG TABS (TAPENTADOL HCL) One by mouth QID as needed for LBP Fill on or after 07/16/2008  #120 x 0   Entered by:   Lamar Sprinkles, CMA   Authorized by:   Jacques Navy MD   Signed by:   Lamar Sprinkles, CMA on 06/18/2009   Method used:   Print then Give to Patient   RxID:   [id number] NUCYNTA 75 MG TABS (TAPENTADOL HCL) One by mouth QID as needed for LBP Fill on or after 06/18/2008  #120 x 0   Entered by:   Lamar Sprinkles, CMA   Authorized by:   Jacques Navy MD   Signed by:   Lamar Sprinkles, CMA on 06/18/2009   Method used:   Print then Give to Patient   RxID:   [id number]

## 2010-06-03 NOTE — Progress Notes (Signed)
Summary: REFERRAL? - Back pain   Phone Note Call from Patient Call back at [phone]   Summary of Call: Pt is decreasing her prednisone and c/o increase in back pain. Does she need referral? Or another office visit?  Initial call taken by: Lamar Sprinkles, CMA,  Sep 23, 2009 8:38 AM  Follow-up for Phone Call        OK for referral to ortho or can be scheduled for attempt at [facility] joint injection Thursday. I will notify [facility] - if she wants to come in Thursday please cancel referral.  Follow-up by: Jacques Navy MD,  Sep 23, 2009 1:29 PM  Additional Follow-up for Phone Call Additional follow up Details #1::        Left vm on wk # Additional Follow-up by: Lamar Sprinkles, CMA,  Sep 23, 2009 2:32 PM

## 2010-06-05 NOTE — Assessment & Plan Note (Signed)
Summary: per phone note/discuss non-narcotic pain reliever/cd   Vital Signs:  Patient profile:   60 year old female Height:      65 inches Weight:      146 pounds BMI:     24.38 Temp:     98.4 degrees F oral Pulse rate:   55 / minute BP sitting:   140 / 80  (left arm) Cuff size:   regular  Vitals Entered By: Lamar Sprinkles, CMA (May 14, 2010 10:25 AM) CC: Discuss pain medications. Also c/o swelling in her left ankle.    Primary Care Provider:  Jacques Navy MD  CC:  Discuss pain medications. Also c/o swelling in her left ankle. [name]  History of Present Illness: patient presents with pain in the right hip, buttock and groin that is persistent.She does have DDD in th elumbar spine and is concerned for sciatica. she has had no injury and does exercise. She reports that she did have an evaluation for hip pain about a year ago with negative imaging studies in regard to hip changes. She has tried ibuprojen 600mg  without relief.  she c/o swelling in the left LE. This will be progressive during the day and resolve almost completely overnight.  Allergies: 1)  ! Penicillin  Past History:  Past Medical History: Last updated: 12/02/2009  INGUINAL PAIN, RIGHT (ICD-789.09) Hx of RENAL FAILURE, ACUTE (ICD-584.9) UNSPECIFIED ANEMIA (ICD-285.9) PULMONARY NODULE (ICD-518.89) URI (ICD-465.9) TINNITUS, LEFT (ICD-388.30) ANXIETY (ICD-300.00) HYPERTENSION (ICD-401.9) UTI'S, HX OF (ICD-V13.00) ALCOHOL ABUSE, HX OF (ICD-V11.3) MIGRAINE HEADACHE (ICD-346.90) MEASLES, HX OF (ICD-V12.09) CHICKENPOX, HX OF (ICD-V15.9) Hx of ENDOMETRIOSIS (ICD-617.9) Hx of FIBROIDS, UTERUS (ICD-218.9) * Hx of TAH AND BILATERAL SALPINGO-OOPHORECTOMY * Hx of LAPAROSCOPY FOR EVALUATION OF ENDOMETRIOSIS  Past Surgical History: Last updated: 12/02/2009 * Hx of TAH AND BILATERAL SALPINGO-OOPHORECTOMY * Hx of LAPAROSCOPY FOR EVALUATION OF ENDOMETRIOSIS L2-3 foraminotomy 'Sp '11 L3-4 diskectomy 'Sp '11  G3P2   1 SAB    Family History: Last updated: May 13, 2008 father - deceased @ 14: plane crash mohter- deceasaed @ 68: emphysema, CAD/MI Neg - colon or breast cancer, lipids, DM  Social History: Last updated: May 13, 2008 [employer]- BS interior design Married '79-20 years/divorced, has remained single 1 daughter '84 -lawyer; 1 son -'72 patient lives alone Denies anyhistory or physical or sexual abuse.  Review of Systems       The patient complains of peripheral edema and difficulty walking.  The patient denies anorexia, fever, weight loss, weight gain, chest pain, prolonged cough, abdominal pain, severe indigestion/heartburn, muscle weakness, depression, and enlarged lymph nodes.    Physical Exam  General:  Well-developed,well-nourished,in no acute distress; alert,appropriate and cooperative throughout examination Eyes:  C&S clear Neck:  supple.   Lungs:  normal respiratory effort and normal breath sounds.   Heart:  normal rate and regular rhythm.   Msk:  back exam: nl stand, flex to 30 degrees before pain, nl gait, nl toe/heel walk, nl step up to exam table. DTR nl at patellar tendons, negative SLR sitting, nl sensation.  Right Hip exam; no tenderness with internal rotation, minimal tenderness with external rotation; no tenderness with abduction; tender to AP pressure to pressure against the greater trochanter. Pulses:  2+ radial Extremities:  0-trace edema left ankle Neurologic:  alert & oriented X3.   Skin:  turgor normal and color normal.   Psych:  Oriented X3, normally interactive, good eye contact, and not anxious appearing.       Impression & Recommendations:  Problem # 1:  INGUINAL PAIN, RIGHT (ICD-789.09) Patients exam suggests inflammation right hip or bursa. No limitation in activity or range of motion.  Plan - meloxicam 15mg  once daily - GI precautions reviewed          tramadol 50-100mg  q 8 as needed (to use preferentially over oxycodone.)  The following medications  were removed from the medication list:    Oxycodone Hcl 15 Mg Tabs (Oxycodone hcl) .[name]... 1 every 8 hours as needed pain -managed by botero Her updated medication list for this problem includes:    Oxycodone-acetaminophen 10-325 Mg Tabs (Oxycodone-acetaminophen) .[name]... For breakthru pain - prescribed by botero    Excedrin Migraine 250-250-65 Mg Tabs (Aspirin-acetaminophen-caffeine) .[name]... Take as needed and as directed.    Meloxicam 15 Mg Tabs (Meloxicam) .[name]... 1 by mouth once daily for hip and back pain    Tramadol Hcl 50 Mg Tabs (Tramadol hcl) .[name]... 1 or 2 every 8 hrs as needed for pain  Problem # 2:  VENOUS INSUFFICIENCY, LEFT LEG (ICD-459.81) history is typical for venous insufficiency. Exam is normal. No h/o CHF or renal failure. Full explanation provided including cartoon.  Plan - elevation of leg          otc support hosiery.  Complete Medication List: 1)  Oxycodone-acetaminophen 10-325 Mg Tabs (Oxycodone-acetaminophen) .... For breakthru pain - prescribed by botero 2)  Diazepam 5 Mg Tabs (Diazepam) .[name].. 1 every 6 hours as needed prn 3)  Gabapentin 300 Mg Caps (Gabapentin) .[name].. 1 three times a day 4)  Paroxetine Hcl 40 Mg Tabs (Paroxetine hcl) .... Take two times a day 5)  Trazodone Hcl 100 Mg Tabs (Trazodone hcl) .[name].. 1 tab at bedtime 6)  Atenolol 100 Mg Tabs (Atenolol) .... Take once daily 7)  Diltiazem Hcl Er Beads 180 Mg Xr24h-cap (Diltiazem hcl er beads) .[name].. 1 cap once daily 8)  Omega-3 1000 Mg Caps (Omega-3 fatty acids) .... Take 1 tablet by mouth once a day 9)  Imitrex 100 Mg Tabs (Sumatriptan succinate) .[name].. 1 once daily as needed migraine 10)  Excedrin Migraine 250-250-65 Mg Tabs (Aspirin-acetaminophen-caffeine) .... Take as needed and as directed. 11)  Multivitamins Tabs (Multiple vitamin) .... Take 1 tablet by mouth once a day 12)  Simvastatin 40 Mg Tabs (Simvastatin) .... 1/2 tab once daily 13)  Pantoprazole Sodium 40 Mg Tbec (Pantoprazole sodium) .[name].. 1 by mouth qam 14)   Vitamin C 250 Mg Tabs (Ascorbic acid) .... Take once daily as needed 15)  Calcium-magnesium 500-250 Mg Tabs (Calcium-magnesium) .... Take 1 tablet by mouth once a day 16)  Evamist (estrogen)  .... One spray daily 17)  Vitamin D 2000 Unit Tabs (Cholecalciferol) .[name].. 1 by mouth daily 18)  Vitamin B-12 250 Mcg Tabs (Cyanocobalamin) .... Take 1 tablet by mouth once a day 19)  Meloxicam 15 Mg Tabs (Meloxicam) .[name].. 1 by mouth once daily for hip and back pain 20)  Tramadol Hcl 50 Mg Tabs (Tramadol hcl) .[name].. 1 or 2 every 8 hrs as needed for pain Prescriptions: TRAMADOL HCL 50 MG TABS (TRAMADOL HCL) 1 or 2 every 8 hrs as needed for pain  #180 x 3   Entered and Authorized by:   Jacques Navy MD   Signed by:   Jacques Navy MD on 05/14/2010   Method used:   [facility]        [facility] [address] [phone]* (retail)       [address]       [place]  [place], [place]  [address]       Ph: [phone] or [phone]       Fax: [phone]   RxID:   [id number] MELOXICAM 15 MG TABS (MELOXICAM) 1 by mouth once daily for hip and back pain  #30 x 12   Entered and Authorized by:   Jacques Navy MD   Signed by:   Jacques Navy MD on 05/14/2010   Method used:   [facility]        [facility] [address] [phone][facility] (retail)       [address]       [place], [place]  [address]       Ph: [phone] or [phone]       Fax: [phone]   RxID:   [id number]    Orders Added: 1)  Est. Patient Level III [20254]

## 2010-06-05 NOTE — Progress Notes (Signed)
Summary: BACK PAIN   Phone Note Call from Patient Call back at Home Phone [phone]   Caller: [phone] or [phone] Call For: Jacques Navy MD Summary of Call: Pt recovering from back surgery and requesting relief from back pain. She wants a pain reliever that is non-narcotic as she well be returning to work. If pt needs office visit she will make one if Dr Debby Bud cannot give her something for pain. Please advise. Initial call taken by: Verdell Face,  May 12, 2010 10:25 AM  Follow-up for Phone Call        Last phone note 02/2010 -pt had complicated pain management plan from ortho. MD had advised best discuss at office visit. Please set pt up for office visit this week with Dr Debby Bud. THANKS Follow-up by: Lamar Sprinkles, CMA,  May 12, 2010 1:00 PM  Additional Follow-up for Phone Call Additional follow up Details #1::        Appt Jan 11th @10am . Additional Follow-up by: Verdell Face,  May 12, 2010 2:55 PM

## 2010-06-11 NOTE — Letter (Signed)
Summary: [facility]   Imported By: Sherian Rein 06/06/2010 09:17:35  _____________________________________________________________________  External Attachment:    Type:   Image     Comment:   External Document

## 2010-07-19 LAB — COMPREHENSIVE METABOLIC PANEL
ALT: 26 U/L (ref 0–35)
AST: 41 U/L — ABNORMAL HIGH (ref 0–37)
Alkaline Phosphatase: 83 U/L (ref 39–117)
BUN: 34 mg/dL — ABNORMAL HIGH (ref 6–23)
CO2: 27 mEq/L (ref 19–32)
Calcium: 9.4 mg/dL (ref 8.4–10.5)
Chloride: 97 mEq/L (ref 96–112)
Creatinine, Ser: 2.83 mg/dL — ABNORMAL HIGH (ref 0.4–1.2)
Creatinine, Ser: 5.05 mg/dL — ABNORMAL HIGH (ref 0.4–1.2)
GFR calc non Af Amer: 9 mL/min — ABNORMAL LOW (ref >60)
Glucose, Bld: 75 mg/dL (ref 70–99)
Glucose, Bld: 94 mg/dL (ref 70–99)
Potassium: 2.9 mEq/L — ABNORMAL LOW (ref 3.5–5.1)
Total Bilirubin: 0.4 mg/dL (ref 0.3–1.2)
Total Bilirubin: 0.6 mg/dL (ref 0.3–1.2)
Total Protein: 5.5 g/dL — ABNORMAL LOW (ref 6.0–8.3)

## 2010-07-19 LAB — POCT I-STAT, CHEM 8
BUN: 45 mg/dL — ABNORMAL HIGH (ref 6–23)
Calcium, Ion: 1.12 mmol/L (ref 1.12–1.32)
Chloride: 101 mEq/L (ref 96–112)
HCT: 37 % (ref 36.0–46.0)
Sodium: 136 mEq/L (ref 135–145)
TCO2: 25 mmol/L (ref 0–100)

## 2010-07-19 LAB — LIPID PANEL
Cholesterol: 272 mg/dL — ABNORMAL HIGH (ref 0–200)
HDL: 188 mg/dL (ref >39)
LDL Cholesterol: 75 mg/dL (ref 0–99)
Triglycerides: 46 mg/dL (ref <150)

## 2010-07-19 LAB — URINALYSIS, ROUTINE W REFLEX MICROSCOPIC
Glucose, UA: NEGATIVE mg/dL
Hgb urine dipstick: NEGATIVE
Nitrite: NEGATIVE
Nitrite: NEGATIVE
Specific Gravity, Urine: 1.009 (ref 1.005–1.030)
Specific Gravity, Urine: 1.01 (ref 1.005–1.030)
Urobilinogen, UA: 0.2 mg/dL (ref 0.0–1.0)
pH: 5 (ref 5.0–8.0)

## 2010-07-19 LAB — DIFFERENTIAL
Eosinophils Absolute: 0.1 10*3/uL (ref 0.0–0.7)
Eosinophils Relative: 2 % (ref 0–5)
Lymphocytes Relative: 14 % (ref 12–46)
Lymphs Abs: 0.7 10*3/uL (ref 0.7–4.0)
Monocytes Relative: 8 % (ref 3–12)

## 2010-07-19 LAB — PROTEIN ELECTROPH W RFLX QUANT IMMUNOGLOBULINS
Albumin ELP: 54.7 % — ABNORMAL LOW (ref 55.8–66.1)
Alpha-1-Globulin: 7.2 % — ABNORMAL HIGH (ref 2.9–4.9)
Alpha-2-Globulin: 14.4 % — ABNORMAL HIGH (ref 7.1–11.8)
Beta 2: 6.2 % (ref 3.2–6.5)
Beta Globulin: 7.2 % (ref 4.7–7.2)
Gamma Globulin: 10.3 % — ABNORMAL LOW (ref 11.1–18.8)

## 2010-07-19 LAB — RAPID URINE DRUG SCREEN, HOSP PERFORMED
Barbiturates: NOT DETECTED
Opiates: NOT DETECTED

## 2010-07-19 LAB — CBC
HCT: 30.3 % — ABNORMAL LOW (ref 36.0–46.0)
HCT: 30.5 % — ABNORMAL LOW (ref 36.0–46.0)
HCT: 36 % (ref 36.0–46.0)
Hemoglobin: 12.4 g/dL (ref 12.0–15.0)
MCH: 32.1 pg (ref 26.0–34.0)
MCH: 32.7 pg (ref 26.0–34.0)
MCV: 93.2 fL (ref 78.0–100.0)
MCV: 93.9 fL (ref 78.0–100.0)
Platelets: 188 10*3/uL (ref 150–400)
Platelets: 214 10*3/uL (ref 150–400)
RBC: 3.23 MIL/uL — ABNORMAL LOW (ref 3.87–5.11)
RBC: 3.87 MIL/uL (ref 3.87–5.11)
RDW: 15.1 % (ref 11.5–15.5)
RDW: 15.3 % (ref 11.5–15.5)
WBC: 3.6 10*3/uL — ABNORMAL LOW (ref 4.0–10.5)
WBC: 4.2 10*3/uL (ref 4.0–10.5)
WBC: 4.9 10*3/uL (ref 4.0–10.5)

## 2010-07-19 LAB — BASIC METABOLIC PANEL
Calcium: 8.4 mg/dL (ref 8.4–10.5)
Creatinine, Ser: 1.66 mg/dL — ABNORMAL HIGH (ref 0.4–1.2)
GFR calc Af Amer: 38 mL/min — ABNORMAL LOW (ref >60)
GFR calc non Af Amer: 31 mL/min — ABNORMAL LOW (ref >60)

## 2010-07-19 LAB — URINE CULTURE

## 2010-07-19 LAB — RENAL FUNCTION PANEL
Calcium: 8 mg/dL — ABNORMAL LOW (ref 8.4–10.5)
GFR calc Af Amer: 34 mL/min — ABNORMAL LOW (ref >60)
GFR calc non Af Amer: 28 mL/min — ABNORMAL LOW (ref >60)
Phosphorus: 2.3 mg/dL (ref 2.3–4.6)
Potassium: 2.9 mEq/L — ABNORMAL LOW (ref 3.5–5.1)
Sodium: 138 mEq/L (ref 135–145)

## 2010-07-19 LAB — SODIUM, URINE, RANDOM: Sodium, Ur: 57 mEq/L

## 2010-07-19 LAB — MAGNESIUM: Magnesium: 1.6 mg/dL (ref 1.5–2.5)

## 2010-07-20 LAB — CBC
Hemoglobin: 14 g/dL (ref 12.0–15.0)
RBC: 4.45 MIL/uL (ref 3.87–5.11)
RDW: 17.6 % — ABNORMAL HIGH (ref 11.5–15.5)
WBC: 7.1 10*3/uL (ref 4.0–10.5)

## 2010-07-20 LAB — BASIC METABOLIC PANEL
Calcium: 9.6 mg/dL (ref 8.4–10.5)
GFR calc Af Amer: >60 mL/min (ref >60)
GFR calc non Af Amer: >60 mL/min (ref >60)
Sodium: 138 mEq/L (ref 135–145)

## 2010-07-20 LAB — SURGICAL PCR SCREEN
MRSA, PCR: POSITIVE — AB
Staphylococcus aureus: POSITIVE — AB

## 2010-09-08 ENCOUNTER — Telehealth: Payer: Self-pay | Admitting: *Deleted

## 2010-09-08 NOTE — Telephone Encounter (Signed)
[facility] [address] [phone] Hydrocodone-ace 5-325 qty 60 SIG one tablet every 6 hours prn for pain. Please Advise refill

## 2010-09-08 NOTE — Telephone Encounter (Signed)
Ok for refill x2 

## 2010-09-10 MED ORDER — HYDROCODONE-ACETAMINOPHEN 5-325 MG PO TABS: 2.0000 | ORAL_TABLET | Freq: Four times a day (QID) | ORAL | Status: DC | PRN

## 2010-09-10 NOTE — Telephone Encounter (Signed)
Refill called in. 

## 2010-09-12 ENCOUNTER — Telehealth: Payer: Self-pay | Admitting: *Deleted

## 2010-09-12 MED ORDER — DIAZEPAM 5 MG PO TABS: 5.0000 mg | ORAL_TABLET | Freq: Four times a day (QID) | ORAL | Status: DC | PRN

## 2010-09-12 NOTE — Telephone Encounter (Signed)
Request for Diazepam 5mg  tab Please advise.

## 2010-09-12 NOTE — Telephone Encounter (Signed)
Diazepam is not on med list. Need more information: why, strength, frequency

## 2010-09-12 NOTE — Telephone Encounter (Signed)
Rx phoned in per VO Dr Debby Bud.

## 2010-09-21 ENCOUNTER — Other Ambulatory Visit: Payer: Self-pay | Admitting: Internal Medicine

## 2010-09-29 ENCOUNTER — Other Ambulatory Visit: Payer: Self-pay | Admitting: Internal Medicine

## 2010-10-17 ENCOUNTER — Other Ambulatory Visit: Payer: Self-pay | Admitting: Internal Medicine

## 2010-10-24 ENCOUNTER — Other Ambulatory Visit: Payer: Self-pay | Admitting: Internal Medicine

## 2010-10-27 NOTE — Telephone Encounter (Signed)
Request for Paroxetine [last refill 10/07/09 #180x3]

## 2010-10-29 ENCOUNTER — Telehealth: Payer: Self-pay | Admitting: *Deleted

## 2010-10-29 MED ORDER — PAROXETINE HCL 40 MG PO TABS: 40.0000 mg | ORAL_TABLET | Freq: Two times a day (BID) | ORAL | Status: DC

## 2010-10-29 NOTE — Telephone Encounter (Signed)
[facility] [address] per past record. LMOVM for pt to check with pharmacy

## 2010-10-29 NOTE — Telephone Encounter (Signed)
Pt left vm - Needs paroxetine 40 mg BID. Unsure pharm

## 2010-10-31 ENCOUNTER — Other Ambulatory Visit: Payer: Self-pay | Admitting: Internal Medicine

## 2010-10-31 NOTE — Telephone Encounter (Signed)
k for refill x 5 

## 2010-10-31 NOTE — Telephone Encounter (Signed)
Ok for refill prn 

## 2010-11-03 NOTE — Telephone Encounter (Signed)
Ok to refill x 5 

## 2010-11-06 ENCOUNTER — Telehealth: Payer: Self-pay | Admitting: *Deleted

## 2010-11-06 MED ORDER — TRAMADOL HCL 50 MG PO TABS
50.0000 mg | ORAL_TABLET | Freq: Four times a day (QID) | ORAL | Status: DC | PRN
Start: 2010-11-06 — End: 2011-02-14

## 2010-11-06 NOTE — Telephone Encounter (Signed)
Ok for refill  x3 

## 2010-11-06 NOTE — Telephone Encounter (Signed)
[facility] Tramadol 50 mg QTY 180 last written 05/15/2010. SIG take one to two tab every 8 hours prn. Please Advise refill

## 2010-11-06 NOTE — Telephone Encounter (Signed)
Called pharmacy to check status of 10/29/10 Rx requested for Paxil. Pharmacy has request on file. Denied as duplicate.

## 2010-11-18 ENCOUNTER — Other Ambulatory Visit: Payer: Self-pay | Admitting: *Deleted

## 2010-11-18 MED ORDER — PAROXETINE HCL 40 MG PO TABS: 40.0000 mg | ORAL_TABLET | Freq: Two times a day (BID) | ORAL | Status: DC

## 2010-11-27 ENCOUNTER — Other Ambulatory Visit: Payer: Self-pay | Admitting: Internal Medicine

## 2010-12-10 ENCOUNTER — Other Ambulatory Visit: Payer: Self-pay | Admitting: Internal Medicine

## 2010-12-12 ENCOUNTER — Other Ambulatory Visit: Payer: Self-pay | Admitting: Dermatology

## 2010-12-19 ENCOUNTER — Other Ambulatory Visit: Payer: Self-pay | Admitting: Internal Medicine

## 2010-12-27 ENCOUNTER — Other Ambulatory Visit: Payer: Self-pay | Admitting: Internal Medicine

## 2011-02-03 ENCOUNTER — Other Ambulatory Visit: Payer: Self-pay | Admitting: Internal Medicine

## 2011-02-04 NOTE — Telephone Encounter (Signed)
Refill x5 

## 2011-02-05 ENCOUNTER — Ambulatory Visit (HOSPITAL_BASED_OUTPATIENT_CLINIC_OR_DEPARTMENT_OTHER)
Admission: RE | Admit: 2011-02-05 | Discharge: 2011-02-06 | Disposition: A | Discharge status: Home / Self Care | Payer: PRIVATE HEALTH INSURANCE | Source: Ambulatory Visit | Attending: Gynecology | Admitting: Gynecology

## 2011-02-05 DIAGNOSIS — Z01810 Encounter for preprocedural cardiovascular examination: Secondary | ICD-10-CM | POA: Insufficient documentation

## 2011-02-05 DIAGNOSIS — N8189 Other female genital prolapse: Secondary | ICD-10-CM | POA: Insufficient documentation

## 2011-02-05 DIAGNOSIS — K219 Gastro-esophageal reflux disease without esophagitis: Secondary | ICD-10-CM | POA: Insufficient documentation

## 2011-02-05 DIAGNOSIS — I10 Essential (primary) hypertension: Secondary | ICD-10-CM | POA: Insufficient documentation

## 2011-02-05 DIAGNOSIS — N815 Vaginal enterocele: Secondary | ICD-10-CM | POA: Insufficient documentation

## 2011-02-05 DIAGNOSIS — N816 Rectocele: Secondary | ICD-10-CM | POA: Insufficient documentation

## 2011-02-05 DIAGNOSIS — N8111 Cystocele, midline: Secondary | ICD-10-CM | POA: Insufficient documentation

## 2011-02-05 DIAGNOSIS — Z01812 Encounter for preprocedural laboratory examination: Secondary | ICD-10-CM | POA: Insufficient documentation

## 2011-02-10 NOTE — Op Note (Signed)
Bianca Haley, MANOCCHIO NO.:  [id number]  MEDICAL RECORD NO.:  [id number]  LOCATION:                                 FACILITY:  PHYSICIAN:  Gretta Cool, M.D. DATE OF BIRTH:  1950-10-18  DATE OF PROCEDURE:  02/05/2011 DATE OF DISCHARGE:                              OPERATIVE REPORT   SITE OF SURGERY:  [facility]  PREOPERATIVE DIAGNOSIS:  Global pelvic organ prolapse with vault prolapse, cystocele, rectocele, enterocele, grade 3.  POSTOPERATIVE DIAGNOSIS:  Global pelvic organ prolapse with vault prolapse, cystocele, rectocele, enterocele, grade 3.  SURGEON:  Gretta Cool, M.D.  ASSISTANT:  Dr. Lodema Hong  ANESTHESIA:  LMA general.  PROCEDURE:  Anteroposterior enterocele repairs, cardinal uterosacral colposuspension, banana suprapubic catheter placement.  DESCRIPTION OF PROCEDURE:  Under excellent general anesthesia as above with the patient prepped and draped in Allen stirrups with her bladder drained by Foley catheter, the procedure was begun by grasping the apex of the vaginal cuff with Allis clamps.  The previous closure site was identified.  Transverse incision was made and the mucosa opened.  The anterior mucosa was then infiltrated with Xylocaine with epinephrine and then incised from the apex of the vaginal cuff to the suburethral area. The mucosa was then reflected from the pubocervical fascia.  The urethra was then supported with a series of suburethral sutures to close the suburethral fascia beneath the urethra and lifted into high intra- abdominal position.  The cystocele was then repaired with first pursestring sutures and then interrupted mattress sutures to the apex of the vaginal cuff.  The cardinal uterosacral complex was secured to the bladder pillars at the apex of the vaginal cuff.  At this point, the mucosa was trimmed and closed with a running suture of #2-0 Vicryl to close the mucosa and the upper  layers of pubocervical fascia.  At this point, attention was turned to the posterior repair.  The introitus mucosa was grasped at the posterior aspect with Allis clamps.  A V- shaped incision was made and the intervening tissue excised.  The mucosa was then infiltrated with Xylocaine with epinephrine and dissected to the apex of the vaginal cuff.  The mucosa was then reflected from the perirectal fascia.  At this point, the large enterocele sac was closed with a pursestring suture of 2-0 Vicryl.  The fascia over the enterocele was then plicated with interrupted sutures of 2-0 Vicryl.  Once that was repaired, the cardinal uterosacral complex was identified by Allis clamps.  On the vaginal dimple, the retraction yielded easy location of the cardinal uterosacral complex.  The sutures of 0 Novafil were then placed through the complexes as high up as possible.  At this point, it was secured to the detached perirectal fascia that it slipped two-thirds of the way down to vagina.  The fascia was then drawn all the way to the apex of the vaginal cuff.  At this point, the central defect was closed with pursestring suture of 0 Vicryl to include the bladder pillars and anterior fascia to the uterosacral plication and the central posterior fascia.  Once that fascia was approximated, the fascia was plicated  in the midline transversely from the apex of the vaginal cuff to the introitus.  At this point, the mucosa was trimmed and closed from the apex down to mid vagina.  The levator fascia was widely separated in the midline was then approximated with interrupted sutures of 2-0 Vicryl. The perineal body muscles were then rebuilt with interrupted sutures of 2-0 Vicryl and then the mucosa closed with subcuticular closure of 2-0 Vicryl including the upper layers of endopelvic fascia.  The mucosa and the skin of the introitus were closed with subcuticular closure.  At this point, the procedure was terminated  without complication.  A pack was placed in the vagina for control of mucosal bleeding.  A banana suprapubic cystic catheter was then placed after filling the bladder with approximately 200 cc of saline.  The banana suprapubic cyst catheter was secured with 0 Novafil sutures.  At this point, the procedure was terminated without complication.  The patient turned recovery room in excellent condition.          ______________________________ Gretta Cool, M.D.     CWL/MEDQ  D:  02/05/2011  T:  02/06/2011  Job:  [id number]  cc:   Dr. Cloretta Ned, [place]  Dr. Lodema Hong  Electronically Signed by Beather Arbour M.D. on 02/10/2011 12:02:03 PM

## 2011-02-12 ENCOUNTER — Other Ambulatory Visit: Payer: Self-pay | Admitting: Internal Medicine

## 2011-02-14 ENCOUNTER — Inpatient Hospital Stay (HOSPITAL_COMMUNITY)
Admission: AD | Admit: 2011-02-14 | Discharge: 2011-02-15 | Disposition: A | Discharge status: Home / Self Care | Payer: PRIVATE HEALTH INSURANCE | Source: Ambulatory Visit | Attending: Obstetrics and Gynecology | Admitting: Obstetrics and Gynecology

## 2011-02-14 ENCOUNTER — Encounter (HOSPITAL_COMMUNITY): Payer: Self-pay | Admitting: *Deleted

## 2011-02-14 ENCOUNTER — Encounter: Payer: Self-pay | Admitting: Advanced Practice Midwife

## 2011-02-14 DIAGNOSIS — R51 Headache: Secondary | ICD-10-CM

## 2011-02-14 DIAGNOSIS — R509 Fever, unspecified: Secondary | ICD-10-CM

## 2011-02-14 DIAGNOSIS — R112 Nausea with vomiting, unspecified: Secondary | ICD-10-CM | POA: Insufficient documentation

## 2011-02-14 DIAGNOSIS — R109 Unspecified abdominal pain: Secondary | ICD-10-CM | POA: Insufficient documentation

## 2011-02-14 HISTORY — DX: Headache: R51

## 2011-02-14 HISTORY — DX: Essential (primary) hypertension: I10

## 2011-02-14 LAB — URINALYSIS, ROUTINE W REFLEX MICROSCOPIC
Bilirubin Urine: NEGATIVE
Glucose, UA: NEGATIVE mg/dL
Ketones, ur: 15 mg/dL — AB
Leukocytes, UA: NEGATIVE
pH: 8 (ref 5.0–8.0)

## 2011-02-14 LAB — CBC
HCT: 34 % — ABNORMAL LOW (ref 36.0–46.0)
Hemoglobin: 11.7 g/dL — ABNORMAL LOW (ref 12.0–15.0)
MCH: 29.8 pg (ref 26.0–34.0)
MCV: 86.5 fL (ref 78.0–100.0)
RBC: 3.93 MIL/uL (ref 3.87–5.11)
WBC: 10.1 10*3/uL (ref 4.0–10.5)

## 2011-02-14 LAB — DIFFERENTIAL
Eosinophils Absolute: 0.1 10*3/uL (ref 0.0–0.7)
Eosinophils Relative: 1 % (ref 0–5)
Lymphocytes Relative: 9 % — ABNORMAL LOW (ref 12–46)
Lymphs Abs: 0.9 10*3/uL (ref 0.7–4.0)
Monocytes Absolute: 0.7 10*3/uL (ref 0.1–1.0)
Monocytes Relative: 7 % (ref 3–12)

## 2011-02-14 LAB — BASIC METABOLIC PANEL
Chloride: 93 mEq/L — ABNORMAL LOW (ref 96–112)
GFR calc non Af Amer: >90 mL/min (ref >90)
Glucose, Bld: 127 mg/dL — ABNORMAL HIGH (ref 70–99)
Potassium: 3.4 mEq/L — ABNORMAL LOW (ref 3.5–5.1)
Sodium: 133 mEq/L — ABNORMAL LOW (ref 135–145)

## 2011-02-14 MED ORDER — DEXAMETHASONE SODIUM PHOSPHATE 10 MG/ML IJ SOLN
10.0000 mg | Freq: Once | INTRAMUSCULAR | Status: AC
  Administered 2011-02-14: 10 mg via INTRAVENOUS
  Filled 2011-02-14: qty 1

## 2011-02-14 MED ORDER — PROCHLORPERAZINE EDISYLATE 5 MG/ML IJ SOLN
10.0000 mg | Freq: Once | INTRAMUSCULAR | Status: DC
  Filled 2011-02-14: qty 2

## 2011-02-14 MED ORDER — ONDANSETRON 8 MG PO TBDP
8.0000 mg | ORAL_TABLET | Freq: Once | ORAL | Status: AC
  Administered 2011-02-14: 8 mg via ORAL
  Filled 2011-02-14: qty 1

## 2011-02-14 MED ORDER — DEXTROSE-NACL 5-0.9 % IV SOLN
INTRAVENOUS | Status: DC
  Administered 2011-02-14: 17:00:00 via INTRAVENOUS

## 2011-02-14 MED ORDER — SUMATRIPTAN SUCCINATE 6 MG/0.5ML ~~LOC~~ SOLN
6.0000 mg | Freq: Once | SUBCUTANEOUS | Status: AC
  Administered 2011-02-14: 6 mg via SUBCUTANEOUS
  Filled 2011-02-14: qty 0.5

## 2011-02-14 MED ORDER — CIPROFLOXACIN HCL 500 MG PO TABS
500.0000 mg | ORAL_TABLET | Freq: Once | ORAL | Status: AC
  Administered 2011-02-14: 500 mg via ORAL
  Filled 2011-02-14: qty 1

## 2011-02-14 MED ORDER — ONDANSETRON 8 MG PO TBDP: 8.0000 mg | ORAL_TABLET | Freq: Three times a day (TID) | ORAL | Status: AC | PRN

## 2011-02-14 MED ORDER — DIPHENHYDRAMINE HCL 50 MG/ML IJ SOLN
50.0000 mg | Freq: Once | INTRAMUSCULAR | Status: AC
  Administered 2011-02-14: 50 mg via INTRAVENOUS
  Filled 2011-02-14: qty 1

## 2011-02-14 MED ORDER — PROMETHAZINE HCL 25 MG/ML IJ SOLN
25.0000 mg | Freq: Once | INTRAMUSCULAR | Status: AC
  Administered 2011-02-14: 25 mg via INTRAVENOUS
  Filled 2011-02-14: qty 1

## 2011-02-14 MED ORDER — DIPHENHYDRAMINE HCL 25 MG PO CAPS: 50.0000 mg | ORAL_CAPSULE | Freq: Once | ORAL | Status: DC

## 2011-02-14 MED ORDER — SUMATRIPTAN SUCCINATE 6 MG/0.5ML ~~LOC~~ SOLN
6.0000 mg | Freq: Once | SUBCUTANEOUS | Status: AC | PRN
  Administered 2011-02-14: 6 mg via SUBCUTANEOUS
  Filled 2011-02-14: qty 0.5

## 2011-02-14 MED ORDER — HYDROMORPHONE HCL 1 MG/ML IJ SOLN
2.0000 mg | Freq: Once | INTRAMUSCULAR | Status: AC
  Administered 2011-02-14: 1 mg via INTRAVENOUS
  Filled 2011-02-14: qty 1

## 2011-02-14 MED ORDER — DIPHENHYDRAMINE HCL 50 MG/ML IJ SOLN: 25.0000 mg | Freq: Once | INTRAMUSCULAR | Status: DC

## 2011-02-14 NOTE — ED Provider Notes (Addendum)
History   The patient is a 60 y.o. year old female who presents to [facility] reporting severe bilat low abd pain, N/V, chills and migraine HA since the middle of the night. She is POD#9 S/P Anteroposterior enterocele repairs, cardinal uterosacral  colposuspension and banana suprapubic catheter placement for 3rd degree pelvic organ prolase.  Rx Cipro for UTI Dx 02/12/11, but has only taken 1 per day x 3 doses in error. Bladder pain resolved. Normal BM's including today.  CSN: [id number] Arrival date & time: 02/14/2011  2:05 PM  Chief Complaint  Patient presents with  . Post-op Problem  . Abdominal Pain    (Consider location/radiation/quality/duration/timing/severity/associated sxs/prior treatment) HPI  Past Medical History  Diagnosis Date  . Hypertension   . Back pain   . Headache   . Migraine     Past Surgical History  Procedure Date  . Abdominal hysterectomy   . Bladder suspension   . Back surgery     No family history on file.  History  Substance Use Topics  . Smoking status: Never Smoker   . Smokeless tobacco: Not on file  . Alcohol Use: Yes     no drinking for sevral months    OB History    Grav Para Term Preterm Abortions TAB SAB Ect Mult Living   2 2 2   0 0 0 0 0 2      Review of Systems  Constitutional: Positive for chills and appetite change (Tol POs well since surgery, new onset N/V overnight).  HENT: Negative for sore throat, neck pain and neck stiffness.   Respiratory: Negative for cough.   Cardiovascular: Negative for chest pain.  Gastrointestinal: Positive for nausea, vomiting and abdominal pain (bilt low abd, constant). Negative for diarrhea, constipation and abdominal distention.  Genitourinary: Positive for vaginal bleeding (small amount ). Negative for hematuria, flank pain, decreased urine volume, vaginal pain and pelvic pain.       Suprapubic cath in place, draining well  Musculoskeletal: Negative for myalgias.  Skin:       No pain at cath  insertion sight  Neurological: Positive for weakness and headaches (C/W prior migrain HA's. Generalized, severe, w/ sensitivity to light). Negative for syncope and light-headedness.    Allergies  Penicillins and Morphine and related  Home Medications  No current outpatient prescriptions on file.  BP 175/80  Pulse 105  Temp(Src) 101.7 F (38.7 C) (Oral)  Resp 20  Ht 5' 5.5" (1.664 m)  Wt 63.504 kg (140 lb)  BMI 22.94 kg/m2  Physical Exam  Constitutional: She appears well-developed and well-nourished. She appears distressed.  Eyes: Pupils are equal, round, and reactive to light.  Cardiovascular: Tachycardia present.   Pulmonary/Chest: Effort normal.  Abdominal: Soft. Bowel sounds are normal. She exhibits no distension and no mass. There is tenderness in the right lower quadrant and left lower quadrant. There is guarding. There is no rebound, no CVA tenderness and no tenderness at McBurney's point. No hernia.  Genitourinary: There is bleeding (small amount of serosanguinous discharge, normal odor) around the vagina.  Skin: Skin is dry. She is not diaphoretic.       hot   Results for orders placed during the hospital encounter of 02/14/11 (from the past 24 hour(s))  CBC     Status: Abnormal   Collection Time   02/14/11  2:48 PM      Component Value Range   WBC 10.1  4.0 - 10.5 (K/uL)   RBC 3.93  3.87 - 5.11 (MIL/uL)  Hemoglobin 11.7 (*) 12.0 - 15.0 (g/dL)   HCT 19.1 (*) 47.8 - 46.0 (%)   MCV 86.5  78.0 - 100.0 (fL)   MCH 29.8  26.0 - 34.0 (pg)   MCHC 34.4  30.0 - 36.0 (g/dL)   RDW 29.5  62.1 - 30.8 (%)   Platelets 216  150 - 400 (K/uL)  DIFFERENTIAL     Status: Abnormal   Collection Time   02/14/11  2:48 PM      Component Value Range   Neutrophils Relative 84 (*) 43 - 77 (%)   Neutro Abs 8.4 (*) 1.7 - 7.7 (K/uL)   Lymphocytes Relative 9 (*) 12 - 46 (%)   Lymphs Abs 0.9  0.7 - 4.0 (K/uL)   Monocytes Relative 7  3 - 12 (%)   Monocytes Absolute 0.7  0.1 - 1.0 (K/uL)    Eosinophils Relative 1  0 - 5 (%)   Eosinophils Absolute 0.1  0.0 - 0.7 (K/uL)   Basophils Relative 0  0 - 1 (%)   Basophils Absolute 0.0  0.0 - 0.1 (K/uL)  URINALYSIS, ROUTINE W REFLEX MICROSCOPIC     Status: Abnormal   Collection Time   02/14/11  3:00 PM      Component Value Range   Color, Urine YELLOW  YELLOW    Appearance CLEAR  CLEAR    Specific Gravity, Urine 1.015  1.005 - 1.030    pH 8.0  5.0 - 8.0    Glucose, UA NEGATIVE  NEGATIVE (mg/dL)   Hgb urine dipstick MODERATE (*) NEGATIVE    Bilirubin Urine NEGATIVE  NEGATIVE    Ketones, ur 15 (*) NEGATIVE (mg/dL)   Protein, ur NEGATIVE  NEGATIVE (mg/dL)   Urobilinogen, UA 0.2  0.0 - 1.0 (mg/dL)   Nitrite NEGATIVE  NEGATIVE    Leukocytes, UA NEGATIVE  NEGATIVE   URINE MICROSCOPIC-ADD ON     Status: Normal   Collection Time   02/14/11  3:00 PM      Component Value Range   Squamous Epithelial / LPF RARE  RARE    RBC / HPF 3-6  <3 (RBC/hpf)   OP 250 ml pain yellow urine at 1530/ ED Course  Procedures (including critical care time)    MDM  Assessment: 1. POD#9 abd pain, fever and N/V 2. Migraine 3. Recent UTI Dx w/ good response to Cipro  Plan: 1. IV fluids per consult w/ Dr. Dianne Dun 2. Imitrex 6 mg Economy 3. Zofran 8 mg ODT 4. BMET pending.  5. Irrigate cath   Dr, Eda Paschal assumed care of pt at 1630   [place], [place] 02/14/2011 4:34 PM   1930: Minimal relief of Migraine after 2 doses of Imitrex and 2 mg Dilaudid. Nausea and abd pain resolved. Foley cath flushed easily. 500 ml out since 1530. Discussed findings w/ Dr. Eda Paschal. Recommends consult w/ pt's GP, Illene Regulus. Elby Showers MD at [facility] on call for Dr. Debby Bud consulted. Recommends Migraine protocol (Compazine, Benadryl, Decadron). If no response, recommends head imaging.  2000: Informed  Dr. Eda Paschal of Dr. Claris Che recommendations. Agrees w/ POC. Will transfer pt [facility] or [facility] if head imaging needed.    2130: Pts pain 5/10. Care turned over to  [facility], [place].  Dorathy Kinsman 02/14/2011 9:40 PM   Consult w/Dr. Guadelupe Sabin home with follow-up on Monday.  [facility]

## 2011-02-14 NOTE — Consult Note (Cosign Needed)
Reviewed pt headache improvement, temp (100.3) and desire to go home>DC to home with follow-up with Dr. Nicholas Lose on Monday.  Return to [place] or [facility] if worsening of symptoms.

## 2011-02-14 NOTE — ED Provider Notes (Signed)
I have reviewed patient's chart and records and lab. I examined the patients abdomen and found it soft and nontender without any peritoneal signs. Her bowel sounds were normal. The nurse practitioner said pelvic was fine as it was two days ago in Dr. Johnn Hai office. We agreed to add dilaudid to pain medication for migraine and hydrate her with IV's and then reassess her in one hour. At the moment I do not think she needs any imaging studies.

## 2011-02-14 NOTE — Progress Notes (Signed)
Reviewed medication administration of Cipro with pt; explained importance of taking two times a day.

## 2011-02-14 NOTE — Progress Notes (Signed)
Pt reports having bladde/rbowel tact on 10/4. rpoets having Bladder infection dx on Thursday 02/12/11. Pt reports severe abd pain that strted last night. reprots n/v and fever as well. Dr. Nicholas Lose did surgery and told to come in to be seen

## 2011-02-15 NOTE — ED Provider Notes (Signed)
Patient'sd headache improved with migraine protocol. Abdominal pain went away. Temp decreased to 100.3 with hydration. Pt to continue cipro b.i. D. And see Dr. Nicholas Lose at beginning of week

## 2011-04-05 ENCOUNTER — Other Ambulatory Visit: Payer: Self-pay | Admitting: Internal Medicine

## 2011-05-04 ENCOUNTER — Telehealth: Payer: Self-pay | Admitting: *Deleted

## 2011-05-04 MED ORDER — METOPROLOL TARTRATE 50 MG PO TABS: 50.0000 mg | ORAL_TABLET | Freq: Two times a day (BID) | ORAL | Status: DC

## 2011-05-04 NOTE — Telephone Encounter (Signed)
Metoprolol request. Done.

## 2011-05-18 ENCOUNTER — Telehealth: Payer: Self-pay | Admitting: *Deleted

## 2011-05-18 MED ORDER — ATENOLOL 100 MG PO TABS: 100.0000 mg | ORAL_TABLET | Freq: Every day | ORAL | Status: DC

## 2011-05-18 NOTE — Telephone Encounter (Signed)
Refill request atenolol 100mg .

## 2011-05-19 ENCOUNTER — Other Ambulatory Visit: Payer: Self-pay | Admitting: Internal Medicine

## 2011-05-19 NOTE — Telephone Encounter (Signed)
Already been refilled.

## 2011-05-28 ENCOUNTER — Other Ambulatory Visit: Payer: Self-pay | Admitting: Internal Medicine

## 2011-05-28 ENCOUNTER — Other Ambulatory Visit: Payer: Self-pay | Admitting: Gynecology

## 2011-05-29 NOTE — Telephone Encounter (Signed)
Paxil request [last refill 07.17.12 #[id number]]

## 2011-06-28 ENCOUNTER — Other Ambulatory Visit: Payer: Self-pay | Admitting: Internal Medicine

## 2011-08-03 ENCOUNTER — Encounter: Payer: Self-pay | Admitting: Internal Medicine

## 2011-08-03 ENCOUNTER — Ambulatory Visit (INDEPENDENT_AMBULATORY_CARE_PROVIDER_SITE_OTHER): Payer: PRIVATE HEALTH INSURANCE | Admitting: Internal Medicine

## 2011-08-03 VITALS — BP 138/82 | HR 69 | Temp 98.1°F | Resp 16 | Wt 143.0 lb

## 2011-08-03 DIAGNOSIS — M720 Palmar fascial fibromatosis [Dupuytren]: Secondary | ICD-10-CM

## 2011-08-03 DIAGNOSIS — I10 Essential (primary) hypertension: Secondary | ICD-10-CM

## 2011-08-03 DIAGNOSIS — G43909 Migraine, unspecified, not intractable, without status migrainosus: Secondary | ICD-10-CM

## 2011-08-03 MED ORDER — SULFAMETHOXAZOLE-TRIMETHOPRIM 800-160 MG PO TABS: 1.0000 | ORAL_TABLET | Freq: Two times a day (BID) | ORAL | Status: AC

## 2011-08-03 MED ORDER — PROMETHAZINE-CODEINE 6.25-10 MG/5ML PO SYRP: 5.0000 mL | ORAL_SOLUTION | ORAL | Status: AC | PRN

## 2011-08-03 NOTE — Progress Notes (Signed)
  Subjective:    Patient ID: Valetta Close, female    DOB: 1950-10-31, 61 y.o.   MRN: [id number]  HPI Ms. Granier has left facial pain, cough, achy, fever . URI symptoms.  Concerns with blood pressure meds and interaction with imitrex. Reviewed med list: she has been taking atenolol, lopressor and diltiazem. She has not had elevated blood pressure but she is borderline controlled. The beta-blocker may have been a part of the migraine prophylaxis.   Past Medical History  Diagnosis Date  . Hypertension   . Back pain   . Headache   . Migraine    Past Surgical History  Procedure Date  . Abdominal hysterectomy   . Bladder suspension   . Back surgery    No family history on file. History   Social History  . Marital Status: Divorced    Spouse Name: N/A    Number of Children: 2  . Years of Education: 16   Occupational History  . interior designer    Social History Main Topics  . Smoking status: Never Smoker   . Smokeless tobacco: Never Used  . Alcohol Use: Yes     no drinking for sevral months  . Drug Use: No  . Sexually Active: Yes -- Female partner(s)   Other Topics Concern  . Not on file   Social History Narrative   HSG, [employer] BS interior design. Married '79 - 20 years/divorced. Now engaged. 1 dtr - '84 - lawyer, 1 son '89. Lives alone. No h/o abuse.       Review of Systems System review is negative for any constitutional, cardiac, pulmonary, GI or neuro symptoms or complaints other than as described in the HPI.     Objective:   Physical Exam Filed Vitals:   08/03/11 1640  BP: 138/82  Pulse: 69  Temp: 98.1 F (36.7 C)  Resp: 16   Weight: 143 lb (64.864 kg)  HEENT- EACs/TMs normal, mild sinus tenderness left frontal and maxillary sinus, throat clear. Neck - supple Cor - RRR Pulm - Clear to A&P. Ext - contracture fifth palmar tendon left hand.        Assessment & Plan:  Sinusitis - moderate  Plan - septra DS bid x 7           Prom/cod  1 tsp q 6, 2  tsp qHS

## 2011-08-03 NOTE — Patient Instructions (Signed)
Upper respiratory infection/sinusitis - plan Septra DS twice a day for a week - antibiotic; sudafed (generic) 30 mg Am and afternoon; stove top vaporizer; ADVIL or tylenol for fever, aches; promethazine/codeine 1 -2 tsp at bedtime, 1 tsp q 6 during the day. Hydrate, Vitamin C, ecchinacea.  Dupytren's contracture - see handout. Watchful waiting. When it is a real problem a hand surgeon will help.  Blood pressure - OK control today 138/78. You need to stop the lopressor (metoprolol) twice a day; continue the atenolol 100 mg once a day and the diltaizem 180 mg once a day. Check your blood pressure and report back to me if it is going up and I will prescribe a mild diuretic as the next step.   Migraine - An option is to change your caregiver to Dr. Amelia Jo.

## 2011-08-04 ENCOUNTER — Encounter: Payer: Self-pay | Admitting: Internal Medicine

## 2011-08-04 DIAGNOSIS — M720 Palmar fascial fibromatosis [Dupuytren]: Secondary | ICD-10-CM | POA: Insufficient documentation

## 2011-08-04 NOTE — Assessment & Plan Note (Signed)
Controlled. Reassured that no drug-drug interaction listed for imitrex with her current BP meds.

## 2011-08-04 NOTE — Assessment & Plan Note (Signed)
Patient with early contracture of left palm. This is not yet interfering with function.  Plan - patient education - handout from [employer].

## 2011-08-04 NOTE — Assessment & Plan Note (Signed)
BP Readings from Last 3 Encounters:  08/03/11 138/82  02/15/11 139/54  05/14/10 140/80   Reviewed med list: had duplicate beta-blockers: will stop lopressor 25 mg bid and continue atenolol 100 mg daily. Continue diltiazem. Cross referenced to imitrex - no contra-indications or drug interactions listed.  Plan - monitor BP and report back. For rising SBP will add diuretic

## 2011-09-02 ENCOUNTER — Other Ambulatory Visit: Payer: Self-pay | Admitting: Internal Medicine

## 2011-09-03 ENCOUNTER — Telehealth: Payer: Self-pay

## 2011-09-03 DIAGNOSIS — M720 Palmar fascial fibromatosis [Dupuytren]: Secondary | ICD-10-CM

## 2011-09-03 NOTE — Telephone Encounter (Signed)
Pt called requesting referral regarding hand pain. Per pt , Dr Shepard [facility] that MD recommended is out of her network.

## 2011-09-04 NOTE — Telephone Encounter (Signed)
[facility] says you have to put referral in to them?????Bianca Haley

## 2011-09-04 NOTE — Telephone Encounter (Signed)
Refer to Dr. Butler Denmark, [place] [facility]. Please notify [facility]

## 2011-09-07 ENCOUNTER — Telehealth: Payer: Self-pay

## 2011-09-07 ENCOUNTER — Ambulatory Visit: Payer: PRIVATE HEALTH INSURANCE | Admitting: Endocrinology

## 2011-09-07 MED ORDER — PROMETHAZINE-CODEINE 6.25-10 MG/5ML PO SYRP: 5.0000 mL | ORAL_SOLUTION | ORAL | Status: AC | PRN

## 2011-09-07 MED ORDER — DOXYCYCLINE HYCLATE 100 MG PO TABS: 100.0000 mg | ORAL_TABLET | Freq: Two times a day (BID) | ORAL | Status: AC

## 2011-09-07 NOTE — Telephone Encounter (Signed)
Ok for prom/cod cough syrup - called in by MEN.

## 2011-09-07 NOTE — Telephone Encounter (Signed)
Pt advised of Rx and MD's recommendation. Pt is also requesting cough medication. She says that she is having difficulty sleeping at night due to productive cough, please advise.

## 2011-09-07 NOTE — Telephone Encounter (Signed)
Pt called c/o chest congestion, cough, ST, chills fever, and fatigue. Pt says that these are the same sxs she has at last OV that never truly resolved. Pt is requesting Rx to treat.

## 2011-09-07 NOTE — Telephone Encounter (Signed)
Fever?, purulent sputum? Please document these. OK for doxycycline 100 mg bid x 10 days. Rx done. Mucinex 1200 mg bid, sudafed 30 mg bid.

## 2011-09-12 ENCOUNTER — Other Ambulatory Visit: Payer: Self-pay | Admitting: Internal Medicine

## 2011-10-02 ENCOUNTER — Other Ambulatory Visit: Payer: Self-pay | Admitting: Gynecology

## 2011-10-02 DIAGNOSIS — Z1231 Encounter for screening mammogram for malignant neoplasm of breast: Secondary | ICD-10-CM

## 2011-10-13 ENCOUNTER — Other Ambulatory Visit: Payer: Self-pay | Admitting: Neurosurgery

## 2011-10-13 DIAGNOSIS — M5410 Radiculopathy, site unspecified: Secondary | ICD-10-CM

## 2011-10-13 DIAGNOSIS — M542 Cervicalgia: Secondary | ICD-10-CM

## 2011-10-13 DIAGNOSIS — M549 Dorsalgia, unspecified: Secondary | ICD-10-CM

## 2011-10-15 ENCOUNTER — Ambulatory Visit
Admission: RE | Admit: 2011-10-15 | Discharge: 2011-10-15 | Disposition: A | Discharge status: Home / Self Care | Payer: PRIVATE HEALTH INSURANCE | Source: Ambulatory Visit | Attending: Neurosurgery | Admitting: Neurosurgery

## 2011-10-15 VITALS — BP 146/62 | HR 54

## 2011-10-15 DIAGNOSIS — M549 Dorsalgia, unspecified: Secondary | ICD-10-CM

## 2011-10-15 DIAGNOSIS — M5410 Radiculopathy, site unspecified: Secondary | ICD-10-CM

## 2011-10-15 DIAGNOSIS — M542 Cervicalgia: Secondary | ICD-10-CM

## 2011-10-15 MED ORDER — DIAZEPAM 5 MG PO TABS
10.0000 mg | ORAL_TABLET | Freq: Once | ORAL | Status: AC
  Administered 2011-10-15: 10 mg via ORAL

## 2011-10-15 MED ORDER — MEPERIDINE HCL 100 MG/ML IJ SOLN
75.0000 mg | Freq: Once | INTRAMUSCULAR | Status: AC
  Administered 2011-10-15: 75 mg via INTRAMUSCULAR

## 2011-10-15 MED ORDER — ONDANSETRON HCL 4 MG/2ML IJ SOLN
4.0000 mg | Freq: Once | INTRAMUSCULAR | Status: AC
  Administered 2011-10-15: 4 mg via INTRAMUSCULAR

## 2011-10-15 MED ORDER — IOHEXOL 300 MG/ML  SOLN
10.0000 mL | Freq: Once | INTRAMUSCULAR | Status: AC | PRN
  Administered 2011-10-15: 10 mL via INTRATHECAL

## 2011-10-15 NOTE — Progress Notes (Signed)
Pt has been off tramadol, trazadone and paroxetine for the past 2 days.

## 2011-10-15 NOTE — Discharge Instructions (Signed)
Myelogram Discharge Instructions  1. Go home and rest quietly for the next 24 hours.  It is important to lie flat for the next 24 hours.  Get up only to go to the restroom.  You may lie in the bed or on a couch on your back, your stomach, your left side or your right side.  You may have one pillow under your head.  You may have pillows between your knees while you are on your side or under your knees while you are on your back.  2. DO NOT drive today.  Recline the seat as far back as it will go, while still wearing your seat belt, on the way home.  3. You may get up to go to the bathroom as needed.  You may sit up for 10 minutes to eat.  You may resume your normal diet and medications unless otherwise indicated.  Drink lots of extra fluids today and tomorrow.  4. The incidence of headache, nausea, or vomiting is about 5% (one in 20 patients).  If you develop a headache, lie flat and drink plenty of fluids until the headache goes away.  Caffeinated beverages may be helpful.  If you develop severe nausea and vomiting or a headache that does not go away with flat bed rest, call [phone].  5. You may resume normal activities after your 24 hours of bed rest is over; however, do not exert yourself strongly or do any heavy lifting tomorrow. If when you get up you have a headache when standing, go back to bed and force fluids for another 24 hours.  6. Call your physician for a follow-up appointment.  The results of your myelogram will be sent directly to your physician by the following day.  7. If you have any questions or if complications develop after you arrive home, please call [phone].  Discharge instructions have been explained to the patient.  The patient, or the person responsible for the patient, fully understands these instructions.       May resume trazadone, tramadol, and paroxetine on October 15, 2011, after 1:00 pm.

## 2011-10-20 ENCOUNTER — Telehealth: Payer: Self-pay

## 2011-10-20 DIAGNOSIS — M720 Palmar fascial fibromatosis [Dupuytren]: Secondary | ICD-10-CM

## 2011-10-20 NOTE — Telephone Encounter (Signed)
Pt called requesting a new referral be placed for orthopaedic surgeon Dr Melina Fiddler at [facility].  Fax to [phone] Salena Saner.

## 2011-10-20 NOTE — Telephone Encounter (Signed)
Order put in to [facility] for referral for dupytren's contracture - Dr. Dierdre Searles at [facility]

## 2011-10-22 ENCOUNTER — Ambulatory Visit
Admission: RE | Admit: 2011-10-22 | Discharge: 2011-10-22 | Disposition: A | Discharge status: Home / Self Care | Payer: PRIVATE HEALTH INSURANCE | Source: Ambulatory Visit | Attending: Gynecology | Admitting: Gynecology

## 2011-10-22 DIAGNOSIS — Z1231 Encounter for screening mammogram for malignant neoplasm of breast: Secondary | ICD-10-CM

## 2011-11-29 ENCOUNTER — Other Ambulatory Visit: Payer: Self-pay | Admitting: Internal Medicine

## 2011-12-03 ENCOUNTER — Other Ambulatory Visit: Payer: Self-pay | Admitting: Internal Medicine

## 2011-12-03 NOTE — Telephone Encounter (Signed)
PATIENT REQEUST MED REFILL FOR TRAZODONE . LAST OV 08/2010

## 2011-12-13 ENCOUNTER — Other Ambulatory Visit: Payer: Self-pay | Admitting: Internal Medicine

## 2011-12-16 ENCOUNTER — Other Ambulatory Visit: Payer: Self-pay | Admitting: Internal Medicine

## 2012-01-03 ENCOUNTER — Other Ambulatory Visit: Payer: Self-pay | Admitting: Internal Medicine

## 2012-01-18 ENCOUNTER — Other Ambulatory Visit: Payer: Self-pay | Admitting: Internal Medicine

## 2012-01-24 ENCOUNTER — Other Ambulatory Visit: Payer: Self-pay | Admitting: Internal Medicine

## 2012-03-23 ENCOUNTER — Telehealth: Payer: Self-pay

## 2012-03-23 NOTE — Telephone Encounter (Signed)
Sent Rx for [facility] to [facility] on record.

## 2012-03-23 NOTE — Telephone Encounter (Signed)
[facility] Triage Call Report Triage Record Num: [id number] Operator: Albertine Grates Patient Name: Bianca Haley Call Date & Time: 03/22/2012 5:56:38PM Patient Phone: [phone] PCP: Illene Regulus Patient Gender: Female PCP Fax : [phone] Patient DOB: [birth year] Practice Name: [facility] Reason for Call: Caller: [name]/Patient; PCP: Illene Regulus (Adults only); CB#: [phone]; States [facility] advised if MD will send script for Pneumonia vaccine they will give the vaccine. Wants to know if MD will send script to pharmacy. Advised call office 11-20. Protocol(s) Used: Office Note Recommended Outcome per Protocol: Information Noted and Sent to Office Reason for Outcome: Caller information to office Care Advice: ~ 03/22/2012 5:59:34PM Page 1 of 1 [id number]

## 2012-03-23 NOTE — Telephone Encounter (Signed)
Caller: Lashauna/Patient; Phone: [phone]; Reason for Call: Patient calling, would like to get a pneumonia vaccine and [facility] [address] at [phone].  Asking that a script be sent over please.  I apologize, she didn't tell me she had already left a message about this earlier today.

## 2012-03-24 MED ORDER — PNEUMOCOCCAL VAC POLYVALENT 25 MCG/0.5ML IJ INJ: 0.5000 mL | INJECTION | Freq: Once | INTRAMUSCULAR | Status: DC

## 2012-03-25 DIAGNOSIS — M720 Palmar fascial fibromatosis [Dupuytren]: Secondary | ICD-10-CM | POA: Insufficient documentation

## 2012-04-04 ENCOUNTER — Other Ambulatory Visit: Payer: Self-pay | Admitting: Internal Medicine

## 2012-07-06 ENCOUNTER — Other Ambulatory Visit: Payer: Self-pay | Admitting: Internal Medicine

## 2012-08-10 ENCOUNTER — Other Ambulatory Visit: Payer: Self-pay | Admitting: Internal Medicine

## 2012-09-04 ENCOUNTER — Other Ambulatory Visit: Payer: Self-pay | Admitting: Internal Medicine

## 2012-09-28 ENCOUNTER — Other Ambulatory Visit: Payer: Self-pay | Admitting: Internal Medicine

## 2012-09-28 ENCOUNTER — Ambulatory Visit (INDEPENDENT_AMBULATORY_CARE_PROVIDER_SITE_OTHER): Payer: BC Managed Care – HMO | Admitting: Internal Medicine

## 2012-09-28 ENCOUNTER — Encounter: Payer: Self-pay | Admitting: Internal Medicine

## 2012-09-28 VITALS — BP 170/84 | HR 58 | Temp 98.4°F | Ht 65.5 in | Wt 134.0 lb

## 2012-09-28 DIAGNOSIS — J069 Acute upper respiratory infection, unspecified: Secondary | ICD-10-CM

## 2012-09-28 MED ORDER — PROMETHAZINE-CODEINE 6.25-10 MG/5ML PO SYRP: 5.0000 mL | ORAL_SOLUTION | ORAL | Status: DC | PRN

## 2012-09-28 NOTE — Patient Instructions (Addendum)
URI - more in the sinus with lungs being clear. No evidence of a bacterial infection - thus no indication for antibiotics.  Plan Phenergan with codeine - 1 tsp q 6 hrs as needed, may take 2 tsp at bedtime  Mucinex  Hydrate, tylenol for fever or aches (safer than aspirin)  ecchinacea  Call for fever, trouble with breathing or other changes.   Upper Respiratory Infection, Adult An upper respiratory infection (URI) is also sometimes known as the common cold. The upper respiratory tract includes the nose, sinuses, throat, trachea, and bronchi. Bronchi are the airways leading to the lungs. Most people improve within 1 week, but symptoms can last up to 2 weeks. A residual cough may last even longer.  CAUSES Many different viruses can infect the tissues lining the upper respiratory tract. The tissues become irritated and inflamed and often become very moist. Mucus production is also common. A cold is contagious. You can easily spread the virus to others by oral contact. This includes kissing, sharing a glass, coughing, or sneezing. Touching your mouth or nose and then touching a surface, which is then touched by another person, can also spread the virus. SYMPTOMS  Symptoms typically develop 1 to 3 days after you come in contact with a cold virus. Symptoms vary from person to person. They may include:  Runny nose.  Sneezing.  Nasal congestion.  Sinus irritation.  Sore throat.  Loss of voice (laryngitis).  Cough.  Fatigue.  Muscle aches.  Loss of appetite.  Headache.  Low-grade fever. DIAGNOSIS  You might diagnose your own cold based on familiar symptoms, since most people get a cold 2 to 3 times a year. Your caregiver can confirm this based on your exam. Most importantly, your caregiver can check that your symptoms are not due to another disease such as strep throat, sinusitis, pneumonia, asthma, or epiglottitis. Blood tests, throat tests, and X-rays are not necessary to diagnose a  common cold, but they may sometimes be helpful in excluding other more serious diseases. Your caregiver will decide if any further tests are required. RISKS AND COMPLICATIONS  You may be at risk for a more severe case of the common cold if you smoke cigarettes, have chronic heart disease (such as heart failure) or lung disease (such as asthma), or if you have a weakened immune system. The very young and very old are also at risk for more serious infections. Bacterial sinusitis, middle ear infections, and bacterial pneumonia can complicate the common cold. The common cold can worsen asthma and chronic obstructive pulmonary disease (COPD). Sometimes, these complications can require emergency medical care and may be life-threatening. PREVENTION  The best way to protect against getting a cold is to practice good hygiene. Avoid oral or hand contact with people with cold symptoms. Wash your hands often if contact occurs. There is no clear evidence that vitamin C, vitamin E, echinacea, or exercise reduces the chance of developing a cold. However, it is always recommended to get plenty of rest and practice good nutrition. TREATMENT  Treatment is directed at relieving symptoms. There is no cure. Antibiotics are not effective, because the infection is caused by a virus, not by bacteria. Treatment may include:  Increased fluid intake. Sports drinks offer valuable electrolytes, sugars, and fluids.  Breathing heated mist or steam (vaporizer or shower).  Eating chicken soup or other clear broths, and maintaining good nutrition.  Getting plenty of rest.  Using gargles or lozenges for comfort.  Controlling fevers with ibuprofen or  acetaminophen as directed by your caregiver.  Increasing usage of your inhaler if you have asthma. Zinc gel and zinc lozenges, taken in the first 24 hours of the common cold, can shorten the duration and lessen the severity of symptoms. Pain medicines may help with fever, muscle  aches, and throat pain. A variety of non-prescription medicines are available to treat congestion and runny nose. Your caregiver can make recommendations and may suggest nasal or lung inhalers for other symptoms.  HOME CARE INSTRUCTIONS   Only take over-the-counter or prescription medicines for pain, discomfort, or fever as directed by your caregiver.  Use a warm mist humidifier or inhale steam from a shower to increase air moisture. This may keep secretions moist and make it easier to breathe.  Drink enough water and fluids to keep your urine clear or pale yellow.  Rest as needed.  Return to work when your temperature has returned to normal or as your caregiver advises. You may need to stay home longer to avoid infecting others. You can also use a face mask and careful hand washing to prevent spread of the virus. SEEK MEDICAL CARE IF:   After the first few days, you feel you are getting worse rather than better.  You need your caregiver's advice about medicines to control symptoms.  You develop chills, worsening shortness of breath, or brown or red sputum. These may be signs of pneumonia.  You develop yellow or brown nasal discharge or pain in the face, especially when you bend forward. These may be signs of sinusitis.  You develop a fever, swollen neck glands, pain with swallowing, or white areas in the back of your throat. These may be signs of strep throat. SEEK IMMEDIATE MEDICAL CARE IF:   You have a fever.  You develop severe or persistent headache, ear pain, sinus pain, or chest pain.  You develop wheezing, a prolonged cough, cough up blood, or have a change in your usual mucus (if you have chronic lung disease).  You develop sore muscles or a stiff neck. Document Released: 10/14/2000 Document Revised: 07/13/2011 Document Reviewed: 08/22/2010 [facility] Patient Information 2014 [place], [place].

## 2012-09-28 NOTE — Progress Notes (Signed)
Subjective:    Patient ID: Bianca Haley, female    DOB: 07/17/1950, 62 y.o.   MRN: [id number]  HPI Bianca Haley has had a week long h/o cough, wheezing, mild SOB, rhinitis, no rigors or documented fever but she has been taking aspirin on a regular basis. Mild diarrhea, no N/V.   Past Medical History  Diagnosis Date  . Hypertension   . Back pain   . Headache(784.0)   . Migraine    Past Surgical History  Procedure Laterality Date  . Abdominal hysterectomy    . Bladder suspension    . Back surgery     History reviewed. No pertinent family history. History   Social History  . Marital Status: Divorced    Spouse Name: N/A    Number of Children: 2  . Years of Education: 16   Occupational History  . interior designer    Social History Main Topics  . Smoking status: Never Smoker   . Smokeless tobacco: Never Used  . Alcohol Use: Yes     Comment: no drinking for sevral months  . Drug Use: No  . Sexually Active: Yes -- Female partner(s)   Other Topics Concern  . Not on file   Social History Narrative   HSG, [employer] BS interior design. Married '79 - 20 years/divorced. Now engaged. 1 dtr - '84 - lawyer, 1 son '89. Lives alone. No h/o abuse.    Current Outpatient Prescriptions on File Prior to Visit  Medication Sig Dispense Refill  . atenolol (TENORMIN) 100 MG tablet TAKE 1 TABLET (100 MG TOTAL) BY MOUTH DAILY.  90 tablet  3  . celecoxib (CELEBREX) 200 MG capsule Take 200 mg by mouth daily.        [name] gabapentin (NEURONTIN) 300 MG capsule Take 300 mg by mouth 3 (three) times daily.        . meloxicam (MOBIC) 15 MG tablet Take 15 mg by mouth daily.        [name] PARoxetine (PAXIL) 40 MG tablet TAKE 1 TABLET (40 MG TOTAL) BY MOUTH 2 (TWO) TIMES DAILY.  60 tablet  5  . pneumococcal 23 valent vaccine (PNU-IMMUNE) 25 MCG/0.5ML injection Inject 0.5 mLs into the muscle once.  5 mL  0  . simvastatin (ZOCOR) 40 MG tablet TAKE 1/2 TABLET BY MOUTH ONCE DAILY  45 tablet  5  . SUMAtriptan (IMITREX)  100 MG tablet TAKE 1 TABLET BY MOUTH DAILY AS NEEDED FOR MIAGRAINE  15 tablet  3  . traZODone (DESYREL) 100 MG tablet TAKE 1 TABLET BY MOUTH AT BEDTIME  30 tablet  1  . metoprolol (LOPRESSOR) 50 MG tablet Take 1 tablet (50 mg total) by mouth 2 (two) times daily.  60 tablet  2  . [DISCONTINUED] diltiazem (CARDIZEM CD) 180 MG 24 hr capsule TAKE ONE CAPSULE EVERY DAY  30 capsule  5   No current facility-administered medications on file prior to visit.      Review of Systems System review is negative for any constitutional, cardiac, pulmonary, GI or neuro symptoms or complaints other than as described in the HPI.     Objective:   Physical Exam Filed Vitals:   09/28/12 1645  BP: 170/84  Pulse: 58  Temp: 98.4 F (36.9 C)   gen' - WNWD white woman HEENT- sinus tenderness maxillary bilaterally, minor tenderness frontal sinuses, throat clear, right TM normal, left EAC with cerumen Neck - supple Nodes - negative cervical region Cor- 2+ radial pulse Pulm - RRR  Neuro - A&O x 3       Assessment & Plan:  URI - more in the sinus with lungs being clear. No evidence of a bacterial infection - thus no indication for antibiotics.  Plan Phenergan with codeine - 1 tsp q 6 hrs as needed, may take 2 tsp at bedtime  Mucinex  Hydrate, tylenol for fever or aches (safer than aspirin)  ecchinacea  Call for fever, trouble with breathing or other changes.

## 2012-10-03 ENCOUNTER — Other Ambulatory Visit: Payer: Self-pay | Admitting: Internal Medicine

## 2012-10-04 NOTE — Telephone Encounter (Signed)
Promethazine codeine called to pharmacy

## 2012-10-27 ENCOUNTER — Other Ambulatory Visit: Payer: Self-pay | Admitting: Internal Medicine

## 2012-10-30 ENCOUNTER — Other Ambulatory Visit: Payer: Self-pay | Admitting: Internal Medicine

## 2012-10-31 ENCOUNTER — Other Ambulatory Visit: Payer: Self-pay | Admitting: Internal Medicine

## 2012-10-31 ENCOUNTER — Other Ambulatory Visit (INDEPENDENT_AMBULATORY_CARE_PROVIDER_SITE_OTHER): Payer: BC Managed Care – HMO

## 2012-10-31 DIAGNOSIS — Z0000 Encounter for general adult medical examination without abnormal findings: Secondary | ICD-10-CM

## 2012-10-31 DIAGNOSIS — I10 Essential (primary) hypertension: Secondary | ICD-10-CM

## 2012-10-31 DIAGNOSIS — D649 Anemia, unspecified: Secondary | ICD-10-CM

## 2012-10-31 LAB — COMPREHENSIVE METABOLIC PANEL
ALT: 27 U/L (ref 0–35)
BUN: 13 mg/dL (ref 6–23)
CO2: 35 mEq/L — ABNORMAL HIGH (ref 19–32)
Calcium: 9.7 mg/dL (ref 8.4–10.5)
Chloride: 98 mEq/L (ref 96–112)
Creatinine, Ser: 0.8 mg/dL (ref 0.4–1.2)
GFR: 76.7 mL/min (ref >60.00)
Glucose, Bld: 99 mg/dL (ref 70–99)

## 2012-10-31 LAB — CBC WITH DIFFERENTIAL/PLATELET
Basophils Relative: 0.3 % (ref 0.0–3.0)
Eosinophils Relative: 2 % (ref 0.0–5.0)
HCT: 41.1 % (ref 36.0–46.0)
Lymphs Abs: 2.1 10*3/uL (ref 0.7–4.0)
MCHC: 33.5 g/dL (ref 30.0–36.0)
MCV: 87.1 fl (ref 78.0–100.0)
Monocytes Absolute: 0.4 10*3/uL (ref 0.1–1.0)
Platelets: 252 10*3/uL (ref 150.0–400.0)
WBC: 7 10*3/uL (ref 4.5–10.5)

## 2012-10-31 LAB — LIPID PANEL
Cholesterol: 196 mg/dL (ref 0–200)
HDL: 90.7 mg/dL (ref >39.00)

## 2012-11-06 ENCOUNTER — Encounter: Payer: Self-pay | Admitting: Internal Medicine

## 2012-11-11 ENCOUNTER — Encounter: Payer: Self-pay | Admitting: Internal Medicine

## 2012-11-21 ENCOUNTER — Ambulatory Visit (INDEPENDENT_AMBULATORY_CARE_PROVIDER_SITE_OTHER): Payer: BC Managed Care – HMO | Admitting: Internal Medicine

## 2012-11-21 ENCOUNTER — Other Ambulatory Visit: Payer: Self-pay | Admitting: Internal Medicine

## 2012-11-21 ENCOUNTER — Encounter: Payer: Self-pay | Admitting: Internal Medicine

## 2012-11-21 VITALS — BP 180/100 | HR 56 | Temp 98.4°F | Wt 135.0 lb

## 2012-11-21 DIAGNOSIS — R059 Cough, unspecified: Secondary | ICD-10-CM

## 2012-11-21 DIAGNOSIS — R05 Cough: Secondary | ICD-10-CM

## 2012-11-21 DIAGNOSIS — I10 Essential (primary) hypertension: Secondary | ICD-10-CM

## 2012-11-21 MED ORDER — TRAZODONE HCL 50 MG PO TABS: 50.0000 mg | ORAL_TABLET | Freq: Every evening | ORAL | Status: DC | PRN

## 2012-11-21 MED ORDER — CELECOXIB 200 MG PO CAPS: 200.0000 mg | ORAL_CAPSULE | Freq: Every day | ORAL | Status: DC

## 2012-11-21 NOTE — Patient Instructions (Addendum)
Persistent cough - we have been treating reflux with protonix.   Plan Trial of either loratadine 10 mg once a day or fexofenadine 180 mg once a day  Referral to Dr. Vassie Loll in the pulmonary department for further evaluation of cough - prior to ordering any additional testing.   For sleep - ok to take trazodone up to 150 mg at night as needed.   Cough, Adult  A cough is a reflex that helps clear your throat and airways. It can help heal the body or may be a reaction to an irritated airway. A cough may only last 2 or 3 weeks (acute) or may last more than 8 weeks (chronic).  CAUSES Acute cough:  Viral or bacterial infections. Chronic cough:  Infections.  Allergies.  Asthma.  Post-nasal drip.  Smoking.  Heartburn or acid reflux.  Some medicines.  Chronic lung problems (COPD).  Cancer. SYMPTOMS   Cough.  Fever.  Chest pain.  Increased breathing rate.  High-pitched whistling sound when breathing (wheezing).  Colored mucus that you cough up (sputum). TREATMENT   A bacterial cough may be treated with antibiotic medicine.  A viral cough must run its course and will not respond to antibiotics.  Your caregiver may recommend other treatments if you have a chronic cough. HOME CARE INSTRUCTIONS   Only take over-the-counter or prescription medicines for pain, discomfort, or fever as directed by your caregiver. Use cough suppressants only as directed by your caregiver.  Use a cold steam vaporizer or humidifier in your bedroom or home to help loosen secretions.  Sleep in a semi-upright position if your cough is worse at night.  Rest as needed.  Stop smoking if you smoke. SEEK IMMEDIATE MEDICAL CARE IF:   You have pus in your sputum.  Your cough starts to worsen.  You cannot control your cough with suppressants and are losing sleep.  You begin coughing up blood.  You have difficulty breathing.  You develop pain which is getting worse or is uncontrolled with  medicine.  You have a fever. MAKE SURE YOU:   Understand these instructions.  Will watch your condition.  Will get help right away if you are not doing well or get worse. Document Released: 10/17/2010 Document Revised: 07/13/2011 Document Reviewed: 10/17/2010 [facility] Patient Information 2014 [place], [place].

## 2012-11-21 NOTE — Assessment & Plan Note (Signed)
Very high BP at this visit and last. No symptoms. She has two BB on med list and CCB  Plan  confirm meds  Require home monitoring with report back by phone or MyChart.

## 2012-11-21 NOTE — Progress Notes (Signed)
  Subjective:    Patient ID: Bianca Haley, female    DOB: 1950-11-26, 62 y.o.   MRN: [id number]  HPI Ms. Shryock presents for persistent cough. She reports that the cough is worse at night and does interfere with sleep. She is on PPI therapy for reflux. Reviewed chart and previous CT chest revealed bronchiectasis June '11. There is a question of some post-nasal drainage. She has had no fever, SOB, weight loss or lymphadenopathy. She has never smoked.  PMH, FamHx and SocHx reviewed for any changes and relevance.  Current Outpatient Prescriptions on File Prior to Visit  Medication Sig Dispense Refill  . atenolol (TENORMIN) 100 MG tablet TAKE 1 TABLET (100 MG TOTAL) BY MOUTH DAILY.  90 tablet  3  . diltiazem (TIAZAC) 180 MG 24 hr capsule TAKE ONE CAPSULE EVERY DAY  30 capsule  0  . gabapentin (NEURONTIN) 300 MG capsule Take 300 mg by mouth 3 (three) times daily.        . meloxicam (MOBIC) 15 MG tablet Take 15 mg by mouth daily.        . metoprolol (LOPRESSOR) 50 MG tablet Take 1 tablet (50 mg total) by mouth 2 (two) times daily.  60 tablet  2  . pantoprazole (PROTONIX) 40 MG tablet TAKE 1 TABLET BY MOUTH EVERY MORNING  30 tablet  0  . PARoxetine (PAXIL) 40 MG tablet TAKE 1 TABLET (40 MG TOTAL) BY MOUTH 2 (TWO) TIMES DAILY.  60 tablet  5  . pneumococcal 23 valent vaccine (PNU-IMMUNE) 25 MCG/0.5ML injection Inject 0.5 mLs into the muscle once.  5 mL  0  . promethazine-codeine (PHENERGAN WITH CODEINE) 6.25-10 MG/5ML syrup TAKE 5 MLS EVERY 4 HOURS AS NEEDED FOR COUGH  120 mL  0  . simvastatin (ZOCOR) 40 MG tablet TAKE 1/2 TABLET BY MOUTH ONCE DAILY  45 tablet  5  . SUMAtriptan (IMITREX) 100 MG tablet TAKE 1 TABLET BY MOUTH DAILY AS NEEDED FOR MIAGRAINE  15 tablet  3  . traZODone (DESYREL) 100 MG tablet TAKE 1 TABLET BY MOUTH AT BEDTIME  30 tablet  1  . traZODone (DESYREL) 100 MG tablet TAKE 1 TABLET BY MOUTH AT BEDTIME  30 tablet  1  . [DISCONTINUED] diltiazem (CARDIZEM CD) 180 MG 24 hr capsule TAKE  ONE CAPSULE EVERY DAY  30 capsule  5   No current facility-administered medications on file prior to visit.      Review of Systems System review is negative for any constitutional, cardiac, pulmonary, GI or neuro symptoms or complaints other than as described in the HPI.     Objective:   Physical Exam Filed Vitals:   11/21/12 1026  BP: 180/100  Pulse: 56  Temp: 98.4 F (36.9 C)   BP Readings from Last 3 Encounters:  11/21/12 180/100  09/28/12 170/84  10/15/11 146/62   Gen'l WNWD white woman in no distress HEENT_ C&S clear, PERRLA Cor- RRR Pulm - normal respirations, no cough during the exam, no rales or wheezes. Neuro - A&O x 3, normal gait and station.       Assessment & Plan:  Cough - persistent cough while on PPI therapy  Plan Trial of otc anti-histamine, e.g. Loratadine  Referral to Dr. Vassie Loll for pulmonary evaluation.

## 2012-11-22 ENCOUNTER — Telehealth: Payer: Self-pay

## 2012-11-22 NOTE — Telephone Encounter (Signed)
Message copied by Noreene Larsson on Tue Nov 22, 2012  8:27 AM ------      Message from: Illene Regulus E      Created: Mon Nov 21, 2012 10:10 PM       Please call patient to clarify meds: atenolol or lopressor?      Also BP was very high: she needs to monitor at home or drugstore and report back by Friday her readings by MyChart or phone message.            thanks ------

## 2012-11-22 NOTE — Telephone Encounter (Signed)
Patient states she takes Atenolol 100 mg and Diltiazem 180 mg.   She was advised to monitor her BP readings and report back on Friday.

## 2012-11-22 NOTE — Telephone Encounter (Signed)
Patient was prescribed Celebrex yesterday at her office visit which required prior authorization. Authorization was approved 11/21/2012-05/03/2038 and faxed to the pharmacy. Notice to be scanned in chart.

## 2012-11-25 ENCOUNTER — Other Ambulatory Visit: Payer: Self-pay | Admitting: Internal Medicine

## 2012-11-29 ENCOUNTER — Other Ambulatory Visit: Payer: Self-pay | Admitting: Internal Medicine

## 2012-12-17 ENCOUNTER — Other Ambulatory Visit: Payer: Self-pay | Admitting: Internal Medicine

## 2013-01-04 ENCOUNTER — Encounter: Payer: Self-pay | Admitting: Internal Medicine

## 2013-01-04 ENCOUNTER — Ambulatory Visit (INDEPENDENT_AMBULATORY_CARE_PROVIDER_SITE_OTHER): Payer: BC Managed Care – HMO | Admitting: Internal Medicine

## 2013-01-04 VITALS — BP 144/90 | HR 72 | Temp 98.1°F | Ht 65.5 in | Wt 133.4 lb

## 2013-01-04 DIAGNOSIS — R053 Chronic cough: Secondary | ICD-10-CM | POA: Insufficient documentation

## 2013-01-04 DIAGNOSIS — J209 Acute bronchitis, unspecified: Secondary | ICD-10-CM | POA: Insufficient documentation

## 2013-01-04 DIAGNOSIS — R110 Nausea: Secondary | ICD-10-CM

## 2013-01-04 DIAGNOSIS — R05 Cough: Secondary | ICD-10-CM

## 2013-01-04 DIAGNOSIS — R059 Cough, unspecified: Secondary | ICD-10-CM

## 2013-01-04 MED ORDER — PROMETHAZINE HCL 25 MG PO TABS: 25.0000 mg | ORAL_TABLET | Freq: Four times a day (QID) | ORAL | Status: DC | PRN

## 2013-01-04 MED ORDER — AZITHROMYCIN 250 MG PO TABS: ORAL_TABLET | ORAL | Status: DC

## 2013-01-04 MED ORDER — HYDROCODONE-HOMATROPINE 5-1.5 MG/5ML PO SYRP: 5.0000 mL | ORAL_SOLUTION | Freq: Four times a day (QID) | ORAL | Status: DC | PRN

## 2013-01-04 NOTE — Patient Instructions (Signed)
Please take all new medication as prescribed - the antibiotic, cough med, and nausea medication Please continue all other medications as before Please get more rest and fluids You can also take Mucinex (or it's generic off brand) for congestion, and tylenol as needed for pain.  Please go to the XRAY Department in the Basement (go straight as you get off the elevator) for the x-ray testing  You will be contacted by phone if any changes need to be made immediately.  Otherwise, you will receive a letter about your results with an explanation, but please check with MyChart first.  Please re-schedule your pulmonary appointment if you are unable to attend

## 2013-01-04 NOTE — Progress Notes (Signed)
Subjective:    Patient ID: Bianca Haley, female    DOB: [birth year], 62 y.o.   MRN: [id number]  HPI  Here with acute onset mild to mod 2-3 days ST, HA, general weakness and malaise, with prod cough greenish sputum, but Pt denies chest pain, increased sob or doe, wheezing, orthopnea, PND, increased LE swelling, palpitations, dizziness or syncope, also with signficant nausea and several loose stools.  No sick contacts.  Also with chronic cough for 3 mo, seems to hear a "crackle" noise on exhalation, no recent cxr, has been referred to pulm but having a scheduling conflict, needs to have appt changed.   Past Medical History  Diagnosis Date  . Hypertension   . Back pain   . Headache(784.0)   . Migraine   . Anemia   . Anxiety   . History of UTI   . History of endometriosis   . History of uterine fibroid   . H/O tinnitus     left   Past Surgical History  Procedure Laterality Date  . Abdominal hysterectomy    . Bladder suspension    . Back surgery    . Colonoscopy  02/16/2008    normal  . Laparoscopy      for evauation of endometriosis    reports that she has never smoked. She has never used smokeless tobacco. She reports that  drinks alcohol. She reports that she does not use illicit drugs. family history includes Emphysema in her mother; Heart disease in her mother. Allergies  Allergen Reactions  . Penicillins Shortness Of Breath and Rash  . Morphine And Related Swelling    Severe pain Morphine only; other opioids OK   Current Outpatient Prescriptions on File Prior to Visit  Medication Sig Dispense Refill  . atenolol (TENORMIN) 100 MG tablet TAKE 1 TABLET (100 MG TOTAL) BY MOUTH DAILY.  90 tablet  3  . celecoxib (CELEBREX) 200 MG capsule Take 1 capsule (200 mg total) by mouth daily.  30 capsule  5  . diltiazem (TIAZAC) 180 MG 24 hr capsule TAKE ONE CAPSULE EVERY DAY  30 capsule  5  . gabapentin (NEURONTIN) 300 MG capsule Take 300 mg by mouth 3 (three) times daily.        .  meloxicam (MOBIC) 15 MG tablet Take 15 mg by mouth daily.        . pantoprazole (PROTONIX) 20 MG tablet TAKE 2 TABLETS BY MOUTH EVERY MORNING  60 tablet  5  . PARoxetine (PAXIL) 40 MG tablet TAKE 1 TABLET (40 MG TOTAL) BY MOUTH 2 (TWO) TIMES DAILY.  60 tablet  5  . pneumococcal 23 valent vaccine (PNU-IMMUNE) 25 MCG/0.5ML injection Inject 0.5 mLs into the muscle once.  5 mL  0  . promethazine-codeine (PHENERGAN WITH CODEINE) 6.25-10 MG/5ML syrup TAKE 5 MLS EVERY 4 HOURS AS NEEDED FOR COUGH  120 mL  0  . simvastatin (ZOCOR) 40 MG tablet TAKE 1/2 TABLET BY MOUTH ONCE DAILY  45 tablet  5  . SUMAtriptan (IMITREX) 100 MG tablet TAKE 1 TABLET BY MOUTH DAILY AS NEEDED FOR MIGRAINE  15 tablet  3  . traZODone (DESYREL) 100 MG tablet TAKE 1 TABLET BY MOUTH AT BEDTIME  30 tablet  1  . traZODone (DESYREL) 100 MG tablet TAKE 1 TABLET BY MOUTH AT BEDTIME  30 tablet  1  . traZODone (DESYREL) 100 MG tablet TAKE 1 TABLET BY MOUTH AT BEDTIME  30 tablet  1  . traZODone (DESYREL) 50 MG tablet  Take 1 tablet (50 mg total) by mouth at bedtime as needed for sleep. In adidtion to 100 mg dose = 150 mg  30 tablet  3  . [DISCONTINUED] diltiazem (CARDIZEM CD) 180 MG 24 hr capsule TAKE ONE CAPSULE EVERY DAY  30 capsule  5   No current facility-administered medications on file prior to visit.   Review of Systems  Constitutional: Negative for unexpected weight change, or unusual diaphoresis  HENT: Negative for tinnitus.   Eyes: Negative for photophobia and visual disturbance.  Respiratory: Negative for choking and stridor.   Gastrointestinal: Negative for vomiting and blood in stool.  Genitourinary: Negative for hematuria and decreased urine volume.  Musculoskeletal: Negative for acute joint swelling Skin: Negative for color change and wound.  Neurological: Negative for tremors and numbness other than noted  Psychiatric/Behavioral: Negative for decreased concentration or  hyperactivity.  '    Objective:   Physical  Exam BP 144/90  Pulse 72  Temp(Src) 98.1 F (36.7 C) (Oral)  Ht 5' 5.5" (1.664 m)  Wt 133 lb 6 oz (60.499 kg)  BMI 21.85 kg/m2  SpO2 93% VS noted, mild ill Constitutional: Pt appears well-developed and well-nourished.  HENT: Head: NCAT.  Right Ear: External ear normal.  Left Ear: External ear normal.  Bilat tm's with mild erythema.  Max sinus areas non tender.  Pharynx with mild erythema, no exudate Eyes: Conjunctivae and EOM are normal. Pupils are equal, round, and reactive to light.  Neck: Normal range of motion. Neck supple.  Cardiovascular: Normal rate and regular rhythm.   Pulmonary/Chest: Effort normal and breath sounds somewhat diminished, no rales or wheezing.  Abd:  Soft, NT, non-distended, + BS Neurological: Pt is alert. Not confused  Skin: Skin is warm. No erythema.  Psychiatric: Pt behavior is normal. Thought content normal. mild nervous    Assessment & Plan:

## 2013-01-05 NOTE — Assessment & Plan Note (Signed)
Mild to mod, for antibx course,  to f/u any worsening symptoms or concerns 

## 2013-01-05 NOTE — Assessment & Plan Note (Signed)
Also for cxr, pt to call to re-schedule appt with pulmonary

## 2013-01-05 NOTE — Assessment & Plan Note (Signed)
Likely secondary to acute illness, for anti-emetic prn, increased fluids, rest

## 2013-01-06 ENCOUNTER — Telehealth: Payer: Self-pay | Admitting: Internal Medicine

## 2013-01-06 ENCOUNTER — Ambulatory Visit (INDEPENDENT_AMBULATORY_CARE_PROVIDER_SITE_OTHER)
Admission: RE | Admit: 2013-01-06 | Discharge: 2013-01-06 | Disposition: A | Discharge status: Home / Self Care | Payer: BC Managed Care – HMO | Source: Ambulatory Visit | Attending: Internal Medicine | Admitting: Internal Medicine

## 2013-01-06 DIAGNOSIS — R110 Nausea: Secondary | ICD-10-CM

## 2013-01-06 DIAGNOSIS — R059 Cough, unspecified: Secondary | ICD-10-CM

## 2013-01-06 DIAGNOSIS — R05 Cough: Secondary | ICD-10-CM

## 2013-01-06 DIAGNOSIS — J209 Acute bronchitis, unspecified: Secondary | ICD-10-CM

## 2013-01-06 MED ORDER — LEVOFLOXACIN 250 MG PO TABS: 250.0000 mg | ORAL_TABLET | Freq: Every day | ORAL | Status: DC

## 2013-01-06 NOTE — Telephone Encounter (Signed)
Received cxr results, c/w pna RLL  Pt should change the zpack to levaquin asd  - done erx, for better tx

## 2013-01-06 NOTE — Telephone Encounter (Signed)
Called the patient on cell and home number left message to call back 

## 2013-01-06 NOTE — Telephone Encounter (Signed)
Called the patient and did inform the patients husband of results and to pickup new antibiotic at the pharmacy

## 2013-01-19 ENCOUNTER — Institutional Professional Consult (permissible substitution): Payer: BC Managed Care – HMO | Admitting: Pulmonary Disease

## 2013-01-23 ENCOUNTER — Other Ambulatory Visit: Payer: Self-pay | Admitting: Internal Medicine

## 2013-01-25 ENCOUNTER — Telehealth: Payer: Self-pay | Admitting: Internal Medicine

## 2013-01-25 NOTE — Telephone Encounter (Signed)
Patient Information:  Caller Name: Mamta  Phone: [phone]  Patient: Bianca Haley, Bianca Haley  Gender: Female  DOB: 05/31/1950  Age: 62 Years  PCP: Illene Regulus (Adults only)  Office Follow Up:  Does the office need to follow up with this patient?: No  Instructions For The Office: N/A  RN Note:  pt states she can still hear crackles in her chest  Symptoms  Reason For Call & Symptoms: pt reports she was diagnosed 2.5 weeks ago with Pneumonia.  Pt completed the antibiotic.  Pt states she is doing much better but at night she is having trouble sleeping because of the coughing.  Reviewed Health History In EMR: Yes  Reviewed Medications In EMR: Yes  Reviewed Allergies In EMR: Yes  Reviewed Surgeries / Procedures: Yes  Date of Onset of Symptoms: 01/04/2013  Guideline(s) Used:  Cough  Disposition Per Guideline:   See Today or Tomorrow in Office  Reason For Disposition Reached:   Patient wants to be seen  Advice Given:  N/A  Patient Will Follow Care Advice:  YES  Appointment Scheduled:  01/26/2013 11:00:00 Appointment Scheduled Provider:  Illene Regulus (Adults only)

## 2013-01-26 ENCOUNTER — Encounter: Payer: Self-pay | Admitting: Internal Medicine

## 2013-01-26 ENCOUNTER — Ambulatory Visit (INDEPENDENT_AMBULATORY_CARE_PROVIDER_SITE_OTHER): Payer: BC Managed Care – PPO | Admitting: Internal Medicine

## 2013-01-26 ENCOUNTER — Ambulatory Visit: Payer: Self-pay | Admitting: Internal Medicine

## 2013-01-26 ENCOUNTER — Ambulatory Visit (INDEPENDENT_AMBULATORY_CARE_PROVIDER_SITE_OTHER)
Admission: RE | Admit: 2013-01-26 | Discharge: 2013-01-26 | Disposition: A | Discharge status: Home / Self Care | Payer: BC Managed Care – HMO | Source: Ambulatory Visit | Attending: Internal Medicine | Admitting: Internal Medicine

## 2013-01-26 VITALS — BP 138/92 | HR 62 | Temp 97.3°F | Wt 137.8 lb

## 2013-01-26 DIAGNOSIS — J189 Pneumonia, unspecified organism: Secondary | ICD-10-CM

## 2013-01-26 DIAGNOSIS — Z23 Encounter for immunization: Secondary | ICD-10-CM

## 2013-01-26 MED ORDER — HYDROCODONE-HOMATROPINE 5-1.5 MG/5ML PO SYRP: 5.0000 mL | ORAL_SOLUTION | Freq: Four times a day (QID) | ORAL | Status: DC | PRN

## 2013-01-26 NOTE — Progress Notes (Signed)
  Subjective:    Patient ID: Bianca Haley, female    DOB: 31-Oct-1950, 62 y.o.   MRN: [id number]  HPI Seen Sept 4th by Dr. Jonny Ruiz - diagnosed with pneumonia with abnormal chest x-ray. She had been treated with levaquin. Fever resolved, no purulent sputum but still has a cough and bit of a crackle in the lungs.   PMH, FamHx and SocHx reviewed for any changes and relevance. Current Outpatient Prescriptions on File Prior to Visit  Medication Sig Dispense Refill  . atenolol (TENORMIN) 100 MG tablet TAKE 1 TABLET (100 MG TOTAL) BY MOUTH DAILY.  90 tablet  3  . celecoxib (CELEBREX) 200 MG capsule Take 1 capsule (200 mg total) by mouth daily.  30 capsule  5  . diltiazem (TIAZAC) 180 MG 24 hr capsule TAKE ONE CAPSULE EVERY DAY  30 capsule  5  . gabapentin (NEURONTIN) 300 MG capsule Take 300 mg by mouth 3 (three) times daily.        . meloxicam (MOBIC) 15 MG tablet Take 15 mg by mouth daily.        . pantoprazole (PROTONIX) 20 MG tablet TAKE 2 TABLETS BY MOUTH EVERY MORNING  60 tablet  5  . PARoxetine (PAXIL) 40 MG tablet TAKE 1 TABLET (40 MG TOTAL) BY MOUTH 2 (TWO) TIMES DAILY.  60 tablet  5  . pneumococcal 23 valent vaccine (PNU-IMMUNE) 25 MCG/0.5ML injection Inject 0.5 mLs into the muscle once.  5 mL  0  . simvastatin (ZOCOR) 40 MG tablet TAKE 1/2 TABLET BY MOUTH ONCE DAILY  45 tablet  5  . SUMAtriptan (IMITREX) 100 MG tablet TAKE 1 TABLET BY MOUTH DAILY AS NEEDED FOR MIGRAINE  15 tablet  3  . traZODone (DESYREL) 100 MG tablet TAKE 1 TABLET BY MOUTH AT BEDTIME  30 tablet  1  . traZODone (DESYREL) 50 MG tablet Take 1 tablet (50 mg total) by mouth at bedtime as needed for sleep. In adidtion to 100 mg dose = 150 mg  30 tablet  3  . HYDROcodone-homatropine (HYCODAN) 5-1.5 MG/5ML syrup Take 5 mLs by mouth every 6 (six) hours as needed for cough.  120 mL  1  . [DISCONTINUED] diltiazem (CARDIZEM CD) 180 MG 24 hr capsule TAKE ONE CAPSULE EVERY DAY  30 capsule  5   No current facility-administered  medications on file prior to visit.      Review of Systems System review is negative for any constitutional, cardiac, pulmonary, GI or neuro symptoms or complaints other than as described in the HPI.      Objective:   Physical Exam Filed Vitals:   01/26/13 1533  BP: 138/92  Pulse: 62  Temp: 97.3 F (36.3 C)   Wt Readings from Last 3 Encounters:  01/26/13 137 lb 12.8 oz (62.506 kg)  01/04/13 133 lb 6 oz (60.499 kg)  11/21/12 135 lb (61.236 kg)   BP Readings from Last 3 Encounters:  01/26/13 138/92  01/04/13 144/90  11/21/12 180/100   Gen'l - WNWD  HEENT- TM normal, throat clear Cor - 2+ radial RRR Pulm - normal respiration, good breath sounds throughout, no rales, no rhonchi          Assessment & Plan:  Community Acquired Pneumonia (CAP) - exam is normal with no signs of persistent problem. Good oxygen saturation  Plan Follow up chest xray today  Addendum: CXR  IMPRESSION:  Resolved left basilar and improved right basilar infiltrates.

## 2013-01-26 NOTE — Patient Instructions (Addendum)
Pneumonia - it looks like you won. Chest x-ray today for confirmation but exam is normal  Keep appointment with Dr. Vassie Loll  Flu  Shot today and Prevnar for pneumonia, return in 8+ weeks for polysaccharide pneumonia vaccine

## 2013-02-15 ENCOUNTER — Other Ambulatory Visit: Payer: Self-pay | Admitting: Internal Medicine

## 2013-02-20 ENCOUNTER — Other Ambulatory Visit: Payer: Self-pay | Admitting: Internal Medicine

## 2013-02-21 ENCOUNTER — Encounter: Payer: Self-pay | Admitting: Pulmonary Disease

## 2013-02-21 ENCOUNTER — Observation Stay (HOSPITAL_COMMUNITY): Payer: BC Managed Care – PPO

## 2013-02-21 ENCOUNTER — Ambulatory Visit (INDEPENDENT_AMBULATORY_CARE_PROVIDER_SITE_OTHER): Payer: BC Managed Care – PPO | Admitting: Pulmonary Disease

## 2013-02-21 ENCOUNTER — Encounter (HOSPITAL_COMMUNITY): Payer: Self-pay

## 2013-02-21 ENCOUNTER — Inpatient Hospital Stay (HOSPITAL_COMMUNITY)
Admission: AD | Admit: 2013-02-21 | Discharge: 2013-02-24 | DRG: 871 | Disposition: A | Discharge status: Home / Self Care | Payer: BC Managed Care – PPO | Source: Ambulatory Visit | Attending: Pulmonary Disease | Admitting: Pulmonary Disease

## 2013-02-21 VITALS — BP 82/58 | HR 72 | Temp 98.5°F | Ht 65.5 in | Wt 129.6 lb

## 2013-02-21 DIAGNOSIS — A419 Sepsis, unspecified organism: Principal | ICD-10-CM | POA: Diagnosis present

## 2013-02-21 DIAGNOSIS — F411 Generalized anxiety disorder: Secondary | ICD-10-CM | POA: Diagnosis present

## 2013-02-21 DIAGNOSIS — I5189 Other ill-defined heart diseases: Secondary | ICD-10-CM

## 2013-02-21 DIAGNOSIS — G43909 Migraine, unspecified, not intractable, without status migrainosus: Secondary | ICD-10-CM | POA: Diagnosis present

## 2013-02-21 DIAGNOSIS — R5381 Other malaise: Secondary | ICD-10-CM | POA: Diagnosis present

## 2013-02-21 DIAGNOSIS — I519 Heart disease, unspecified: Secondary | ICD-10-CM | POA: Diagnosis present

## 2013-02-21 DIAGNOSIS — R059 Cough, unspecified: Secondary | ICD-10-CM

## 2013-02-21 DIAGNOSIS — R42 Dizziness and giddiness: Secondary | ICD-10-CM | POA: Diagnosis present

## 2013-02-21 DIAGNOSIS — I10 Essential (primary) hypertension: Secondary | ICD-10-CM | POA: Diagnosis present

## 2013-02-21 DIAGNOSIS — R053 Chronic cough: Secondary | ICD-10-CM

## 2013-02-21 DIAGNOSIS — G4700 Insomnia, unspecified: Secondary | ICD-10-CM | POA: Diagnosis present

## 2013-02-21 DIAGNOSIS — J189 Pneumonia, unspecified organism: Secondary | ICD-10-CM | POA: Diagnosis present

## 2013-02-21 DIAGNOSIS — R05 Cough: Secondary | ICD-10-CM

## 2013-02-21 DIAGNOSIS — K219 Gastro-esophageal reflux disease without esophagitis: Secondary | ICD-10-CM | POA: Diagnosis present

## 2013-02-21 DIAGNOSIS — Z8701 Personal history of pneumonia (recurrent): Secondary | ICD-10-CM

## 2013-02-21 LAB — COMPREHENSIVE METABOLIC PANEL
ALT: 13 U/L (ref 0–35)
AST: 24 U/L (ref 0–37)
Albumin: 3.5 g/dL (ref 3.5–5.2)
Chloride: 94 mEq/L — ABNORMAL LOW (ref 96–112)
Creatinine, Ser: 1.32 mg/dL — ABNORMAL HIGH (ref 0.50–1.10)
Total Bilirubin: 0.8 mg/dL (ref 0.3–1.2)
Total Protein: 6.5 g/dL (ref 6.0–8.3)

## 2013-02-21 LAB — CBC WITH DIFFERENTIAL/PLATELET
Basophils Absolute: 0 10*3/uL (ref 0.0–0.1)
Hemoglobin: 12.2 g/dL (ref 12.0–15.0)
Lymphocytes Relative: 12 % (ref 12–46)
Lymphs Abs: 1.8 10*3/uL (ref 0.7–4.0)
MCV: 86.1 fL (ref 78.0–100.0)
Neutro Abs: 11.7 10*3/uL — ABNORMAL HIGH (ref 1.7–7.7)
Neutrophils Relative %: 80 % — ABNORMAL HIGH (ref 43–77)
Platelets: 211 10*3/uL (ref 150–400)
RBC: 4.17 MIL/uL (ref 3.87–5.11)
RDW: 12.8 % (ref 11.5–15.5)
WBC: 14.6 10*3/uL — ABNORMAL HIGH (ref 4.0–10.5)

## 2013-02-21 LAB — URINALYSIS, ROUTINE W REFLEX MICROSCOPIC
Ketones, ur: NEGATIVE mg/dL
Nitrite: NEGATIVE
Protein, ur: 30 mg/dL — AB
Specific Gravity, Urine: 1.032 — ABNORMAL HIGH (ref 1.005–1.030)
Urobilinogen, UA: 1 mg/dL (ref 0.0–1.0)

## 2013-02-21 LAB — PROTIME-INR
INR: 1.04 (ref 0.00–1.49)
Prothrombin Time: 13.4 seconds (ref 11.6–15.2)

## 2013-02-21 LAB — URINE MICROSCOPIC-ADD ON

## 2013-02-21 LAB — MAGNESIUM: Magnesium: 1.9 mg/dL (ref 1.5–2.5)

## 2013-02-21 LAB — PHOSPHORUS: Phosphorus: 4.5 mg/dL (ref 2.3–4.6)

## 2013-02-21 LAB — LACTIC ACID, PLASMA: Lactic Acid, Venous: 1.3 mmol/L (ref 0.5–2.2)

## 2013-02-21 LAB — PROCALCITONIN: Procalcitonin: 1.33 ng/mL

## 2013-02-21 MED ORDER — GABAPENTIN 300 MG PO CAPS
300.0000 mg | ORAL_CAPSULE | Freq: Every day | ORAL | Status: DC | PRN
  Filled 2013-02-21: qty 1

## 2013-02-21 MED ORDER — PANTOPRAZOLE SODIUM 40 MG PO TBEC
40.0000 mg | DELAYED_RELEASE_TABLET | Freq: Every day | ORAL | Status: DC
  Administered 2013-02-22 – 2013-02-24 (×3): 40 mg via ORAL
  Filled 2013-02-21 (×3): qty 1

## 2013-02-21 MED ORDER — GABAPENTIN 300 MG PO CAPS
900.0000 mg | ORAL_CAPSULE | Freq: Every day | ORAL | Status: DC
  Administered 2013-02-21 – 2013-02-23 (×3): 900 mg via ORAL
  Filled 2013-02-21 (×4): qty 3

## 2013-02-21 MED ORDER — LEVOFLOXACIN IN D5W 500 MG/100ML IV SOLN
500.0000 mg | INTRAVENOUS | Status: DC
  Administered 2013-02-21: 500 mg via INTRAVENOUS
  Filled 2013-02-21 (×2): qty 100

## 2013-02-21 MED ORDER — HYDROMORPHONE HCL 2 MG PO TABS
2.0000 mg | ORAL_TABLET | Freq: Four times a day (QID) | ORAL | Status: DC | PRN
  Administered 2013-02-21 – 2013-02-24 (×7): 2 mg via ORAL
  Filled 2013-02-21 (×9): qty 1

## 2013-02-21 MED ORDER — SODIUM CHLORIDE 0.9 % IV SOLN
INTRAVENOUS | Status: DC
  Administered 2013-02-21 – 2013-02-22 (×2): via INTRAVENOUS

## 2013-02-21 MED ORDER — SODIUM CHLORIDE 0.9 % IV SOLN
250.0000 mL | INTRAVENOUS | Status: DC | PRN
  Administered 2013-02-23: 250 mL via INTRAVENOUS

## 2013-02-21 MED ORDER — SODIUM CHLORIDE 0.9 % IV BOLUS (SEPSIS)
500.0000 mL | Freq: Once | INTRAVENOUS | Status: AC
  Administered 2013-02-21: 500 mL via INTRAVENOUS

## 2013-02-21 NOTE — Patient Instructions (Signed)
We will admit you for observation to [facility] for hypotension We will give you fluids & obtain CXR & blood work

## 2013-02-21 NOTE — Progress Notes (Signed)
Patient requested that home medication Opana be resumed.  Dr. Vassie Loll notified of patient's request.  Dr. Vassie Loll requested that hospital equivalent of half Opana dose be entered per pharmacy, due to patient's hypotension.  Pharmacist Karen Chafe  suggested that Dilaudid 2 mg PO every 6 hours, was a little less than half of what patient was taking at home.  Order entered per pharmacist's recommendation.  Philomena Doheny RN

## 2013-02-21 NOTE — Progress Notes (Signed)
Subjective:    Patient ID: Bianca Haley, female    DOB: 1950-06-10, 62 y.o.   MRN: [id number]  HPI PCP - [facility] 52 never smoker referred for evaluation of chronic cough x . She reports a dry cough , no specific triggers, no throat clearing x 6 months, no relieving factors. She was diagnosed with RLL community acquired pneumonia on 01/04/13 & treated with levaquin with clearing of infiltrate on 01/26/13. She reports cough much improved. However, developed shaking chills & low grade fever x 1 day. She feels weak & dizzy today & was lying supine on the exam table on my arrival. Hypotensive to 80 systolic. Took her atenolol & cardizem CD this am  CT chest 10/2009 >> Scattered areas of scar and bronchiectasis in both lungs,adjacent calcified granulomata in the right lower lobe.   Past Medical History  Diagnosis Date  . Hypertension   . Back pain   . Headache(784.0)   . Migraine   . Anemia   . Anxiety   . History of UTI   . History of endometriosis   . History of uterine fibroid   . H/O tinnitus     left    Past Surgical History  Procedure Laterality Date  . Abdominal hysterectomy    . Bladder suspension    . Back surgery    . Colonoscopy  02/16/2008    normal  . Laparoscopy      for evauation of endometriosis   Allergies  Allergen Reactions  . Penicillins Shortness Of Breath and Rash  . Morphine And Related Swelling    Severe pain Morphine only; other opioids OK   History   Social History  . Marital Status: Widowed    Spouse Name: N/A    Number of Children: 2  . Years of Education: 16   Occupational History  . interior designer    Social History Main Topics  . Smoking status: Never Smoker   . Smokeless tobacco: Never Used  . Alcohol Use: Yes     Comment: no drinking for sevral months  . Drug Use: No  . Sexual Activity: Yes    Partners: Male   Other Topics Concern  . Not on file   Social History Narrative   HSG, [employer] BS interior design. Married  '79 - 20 years/divorced. Now engaged. 1 dtr - '84 - lawyer, 1 son '89. No h/o abuse.   Family History  Problem Relation Age of Onset  . Heart disease Mother   . Emphysema Mother     Review of Systems  Constitutional: Negative for fever and unexpected weight change.  HENT: Positive for congestion and sinus pressure. Negative for dental problem, ear pain, nosebleeds, postnasal drip, rhinorrhea, sneezing, sore throat and trouble swallowing.   Eyes: Negative for redness and itching.  Respiratory: Positive for cough. Negative for chest tightness, shortness of breath and wheezing.   Cardiovascular: Negative for palpitations and leg swelling.  Gastrointestinal: Negative for nausea and vomiting.  Genitourinary: Negative for dysuria.  Musculoskeletal: Negative for joint swelling.  Skin: Negative for rash.  Neurological: Positive for headaches.  Hematological: Does not bruise/bleed easily.  Psychiatric/Behavioral: Negative for dysphoric mood. The patient is not nervous/anxious.        Objective:   Physical Exam  Gen. Pleasant, well-nourished, in no distress, normal affect ENT - no lesions, no post nasal drip Neck: No JVD, no thyromegaly, no carotid bruits Lungs: no use of accessory muscles, no dullness to percussion, clear without rales or rhonchi  Cardiovascular: Rhythm regular, heart sounds  normal, no murmurs or gallops, no peripheral edema Abdomen: soft and non-tender, no hepatosplenomegaly, BS normal. Musculoskeletal: No deformities, no cyanosis or clubbing Neuro:  alert, non focal       Assessment & Plan:

## 2013-02-21 NOTE — H&P (Signed)
PULMONARY  / CRITICAL CARE MEDICINE  Name: RHYS ANCHONDO MRN: [id number] DOB: 02-17-1951    ADMISSION DATE:  02/21/2013   CHIEF COMPLAINT:  gen weakness, chills  PCP - [facility]   HISTORY OF PRESENT ILLNESS:   40 never smoker referred for evaluation of chronic cough x . She reports a dry cough , no specific triggers, no throat clearing x 6 months, no relieving factors. She was diagnosed with RLL community acquired pneumonia on 01/04/13 & treated with levaquin with clearing of infiltrate on 01/26/13. She reports cough much improved. However, developed shaking chills & low grade fever x 1 day. She feels weak & dizzy today & was lying supine on the exam table on my arrival. Hypotensive to 80 systolic. Took her atenolol & cardizem CD this am  CT chest 10/2009 >> Scattered areas of scar and bronchiectasis in both lungs,adjacent calcified granulomata in the right lower lobe  PAST MEDICAL HISTORY :  Past Medical History  Diagnosis Date  . Hypertension   . Back pain   . Headache(784.0)   . Migraine   . Anemia   . Anxiety   . History of UTI   . History of endometriosis   . History of uterine fibroid   . H/O tinnitus     left   Past Surgical History  Procedure Laterality Date  . Abdominal hysterectomy    . Bladder suspension    . Back surgery    . Colonoscopy  02/16/2008    normal  . Laparoscopy      for evauation of endometriosis   Prior to Admission medications   Medication Sig Start Date End Date Taking? Authorizing Provider  atenolol (TENORMIN) 100 MG tablet TAKE 1 TABLET (100 MG TOTAL) BY MOUTH DAILY. 02/15/13  Yes Jacques Navy, MD  celecoxib (CELEBREX) 200 MG capsule Take 1 capsule (200 mg total) by mouth daily. 11/21/12  Yes Jacques Navy, MD  diltiazem [facility]) 180 MG 24 hr capsule TAKE ONE CAPSULE EVERY DAY 11/25/12  Yes Jacques Navy, MD  gabapentin (NEURONTIN) 300 MG capsule Take 300-900 mg by mouth 2 (two) times daily. Take 300 mg every morning AS NEEDED.   Take 900 mg every night at bedtime.   Yes Historical Provider, MD  meloxicam (MOBIC) 15 MG tablet Take 15 mg by mouth daily.     Yes Historical Provider, MD  Multiple Vitamin (MULTIVITAMIN WITH MINERALS) TABS tablet Take 1 tablet by mouth daily.   Yes Historical Provider, MD  oxymorphone (OPANA) 10 MG tablet Take 10 mg by mouth every 6 (six) hours as needed for pain.   Yes Historical Provider, MD  pantoprazole (PROTONIX) 20 MG tablet TAKE 2 TABLETS BY MOUTH EVERY MORNING 12/17/12  Yes Jacques Navy, MD  PARoxetine (PAXIL) 40 MG tablet TAKE 1 TABLET (40 MG TOTAL) BY MOUTH 2 (TWO) TIMES DAILY. 01/23/13  Yes Jacques Navy, MD  pneumococcal 23 valent vaccine (PNU-IMMUNE) 25 MCG/0.5ML injection Inject 0.5 mLs into the muscle once. 03/24/12  Yes Jacques Navy, MD  simvastatin (ZOCOR) 40 MG tablet TAKE 1/2 TABLET BY MOUTH ONCE DAILY 02/15/13  Yes Jacques Navy, MD  SUMAtriptan (IMITREX) 100 MG tablet TAKE 1 TABLET BY MOUTH DAILY AS NEEDED FOR MIGRAINE 11/29/12  Yes Jacques Navy, MD  traZODone (DESYREL) 100 MG tablet TAKE 1 TABLET BY MOUTH AT BEDTIME 11/21/12  Yes Jacques Navy, MD  traZODone (DESYREL) 50 MG tablet Take 1 tablet (50 mg total) by mouth at bedtime  as needed for sleep. In adidtion to 100 mg dose = 150 mg 11/21/12  Yes Jacques Navy, MD   Allergies  Allergen Reactions  . Penicillins Shortness Of Breath and Rash  . Morphine And Related Swelling    Severe pain Morphine only; other opioids OK    FAMILY HISTORY:  Family History  Problem Relation Age of Onset  . Heart disease Mother   . Emphysema Mother    SOCIAL HISTORY:  reports that she has never smoked. She has never used smokeless tobacco. She reports that she drinks alcohol. She reports that she does not use illicit drugs.  REVIEW OF SYSTEMS:   Constitutional: Negative for fever, chills, weight loss, malaise/fatigue and diaphoresis.  HENT: Negative for hearing loss, ear pain, nosebleeds, congestion, sore throat,  neck pain, tinnitus and ear discharge.   Eyes: Negative for blurred vision, double vision, photophobia, pain, discharge and redness.  Respiratory: Negative for cough, hemoptysis, sputum production, shortness of breath, wheezing and stridor.   Cardiovascular: Negative for chest pain, palpitations, orthopnea, claudication, leg swelling and PND.  Gastrointestinal: Negative for heartburn, nausea, vomiting, abdominal pain, diarrhea, constipation, blood in stool and melena.  Genitourinary: Negative for dysuria, urgency, frequency, hematuria and flank pain.  Musculoskeletal: Negative for myalgias, back pain, joint pain and falls.  Skin: Negative for itching and rash.  Neurological: Negative for dizziness, tingling, tremors, sensory change, speech change, focal weakness, seizures, loss of consciousness, weakness and headaches.  Endo/Heme/Allergies: Negative for environmental allergies and polydipsia. Does not bruise/bleed easily.  SUBJECTIVE:   VITAL SIGNS: Temp:  [98.1 F (36.7 C)-98.5 F (36.9 C)] 98.1 F (36.7 C) (10/21 1500) Pulse Rate:  [60-72] 61 (10/21 1500) Resp:  [16-18] 18 (10/21 1500) BP: (82-94)/(58-60) 94/60 mmHg (10/21 1500) SpO2:  [94 %-97 %] 96 % (10/21 1500) Weight:  [129 lb 9.6 oz (58.786 kg)-132 lb 7.9 oz (60.1 kg)] 132 lb 7.9 oz (60.1 kg) (10/21 1309)  PHYSICAL EXAMINATION: Gen. Pleasant, well-nourished, in no distress, normal affect ENT - no lesions, no post nasal drip Neck: No JVD, no thyromegaly, no carotid bruits Lungs: no use of accessory muscles, no dullness to percussion, clear without rales or rhonchi  Cardiovascular: Rhythm regular, heart sounds  normal, no murmurs, no peripheral edema Abdomen: soft and non-tender, no hepatosplenomegaly, BS normal. Musculoskeletal: No deformities, no cyanosis or clubbing Neuro:  alert, non focal Skin:  Warm, no lesions/ rash    Recent Labs Lab 02/21/13 1409  NA 134*  K 3.6  CL 94*  CO2 29  BUN 20  CREATININE 1.32*   GLUCOSE 117*    Recent Labs Lab 02/21/13 1409  HGB 12.2  HCT 35.9*  WBC 14.6*  PLT 211   Dg Chest 2 View  02/21/2013   CLINICAL DATA:  Fever. Dry cough.  EXAM: CHEST  2 VIEW  COMPARISON:  01/26/2013  FINDINGS: Patchy areas of airspace opacity are noted in the right lower lobe the with less prominent areas of patchy airspace opacity in the right upper lobe. The left lung is clear. No pleural effusion or pneumothorax.  The cardiac silhouette is normal in size. The mediastinum is normal in contour.  The bony thorax is demineralized but intact.  IMPRESSION: 1. Multifocal pneumonia, most prominently involving the right lower lobe with smaller patchy areas of pneumonia in the right upper lobe.   Electronically Signed   By: Amie Portland M.D.   On: 02/21/2013 14:39    ASSESSMENT / PLAN:  Recurrent RLL pneumonia - ?etio -  GERD/aspiration vs bronchiectasis related, note recently treated 01/2013 with clearing on FU CXR Plan - IV levaquin Culture, chk urine strep ag for completion Will rp HRCT chest once infx resolved on CXR for bronchiectasis  Hypotension/ sepsis - Check lactate, pct Hydrate - 500 bolus, then 75/h x 24h Hold aten & cardizem - took am dose - long acting meds  -hence hosp admission appropriate -Chk EKG for completion  Due to above, direct admission facilitated from office for observation x 24h Plan discussed with pt & husband   Oretha Milch  Pulmonary and Critical Care Medicine [facility] Pager: [phone]  02/21/2013, 4:59 PM

## 2013-02-22 ENCOUNTER — Inpatient Hospital Stay (HOSPITAL_COMMUNITY): Payer: BC Managed Care – PPO

## 2013-02-22 LAB — URINE CULTURE
Colony Count: NO GROWTH
Culture: NO GROWTH

## 2013-02-22 MED ORDER — SUMATRIPTAN SUCCINATE 100 MG PO TABS
100.0000 mg | ORAL_TABLET | Freq: Every day | ORAL | Status: DC | PRN
  Administered 2013-02-23: 100 mg via ORAL
  Filled 2013-02-22 (×2): qty 1

## 2013-02-22 MED ORDER — LEVOFLOXACIN IN D5W 750 MG/150ML IV SOLN
750.0000 mg | INTRAVENOUS | Status: DC
  Filled 2013-02-22: qty 150

## 2013-02-22 MED ORDER — TRAZODONE HCL 150 MG PO TABS
150.0000 mg | ORAL_TABLET | Freq: Every evening | ORAL | Status: DC | PRN
  Administered 2013-02-22 – 2013-02-23 (×3): 150 mg via ORAL
  Filled 2013-02-22 (×3): qty 1

## 2013-02-22 MED ORDER — DILTIAZEM HCL 60 MG PO TABS
60.0000 mg | ORAL_TABLET | Freq: Three times a day (TID) | ORAL | Status: DC
  Administered 2013-02-22 – 2013-02-23 (×2): 60 mg via ORAL
  Filled 2013-02-22 (×4): qty 1

## 2013-02-22 MED ORDER — ATENOLOL 100 MG PO TABS
100.0000 mg | ORAL_TABLET | Freq: Every day | ORAL | Status: DC
  Administered 2013-02-22 – 2013-02-24 (×3): 100 mg via ORAL
  Filled 2013-02-22 (×3): qty 1

## 2013-02-22 MED ORDER — SIMVASTATIN 40 MG PO TABS
40.0000 mg | ORAL_TABLET | Freq: Every day | ORAL | Status: DC
  Administered 2013-02-22: 40 mg via ORAL
  Filled 2013-02-22: qty 1

## 2013-02-22 MED ORDER — ATORVASTATIN CALCIUM 20 MG PO TABS
20.0000 mg | ORAL_TABLET | Freq: Every day | ORAL | Status: DC
  Administered 2013-02-23: 20 mg via ORAL
  Filled 2013-02-22 (×2): qty 1

## 2013-02-22 MED ORDER — PAROXETINE HCL 20 MG PO TABS
40.0000 mg | ORAL_TABLET | Freq: Two times a day (BID) | ORAL | Status: DC
  Administered 2013-02-22 – 2013-02-24 (×5): 40 mg via ORAL
  Filled 2013-02-22 (×7): qty 2

## 2013-02-22 NOTE — Progress Notes (Signed)
ANTIBIOTIC CONSULT NOTE  Pharmacy Consult for Renal Adjustment of Antibiotics Indication: pneumonia  Anti-infectives   Start     Dose/Rate Route Frequency Ordered Stop   02/21/13 1400  levofloxacin (LEVAQUIN) IVPB 500 mg     500 mg 100 mL/hr over 60 Minutes Intravenous Every 24 hours 02/21/13 1309       Assessment:  62 yoF on IV Levaquin 500 mg daily for PNA.  SCr 1.36, CrCl~41 ml/min  Plan:  SCr up from baseline on admission.  Adjust Levaquin to 750 mg IV q48h.  Will f/u SCr and adjust dose as needed.    Clance Boll 02/22/2013,7:56 AM

## 2013-02-22 NOTE — Progress Notes (Signed)
eLink Physician-Brief Progress Note Patient Name: MARIELYS TRINIDAD DOB: Jun 04, 1950 MRN: [id number]  Date of Service  02/22/2013   HPI/Events of Note   High BP  eICU Interventions  Restart cardizem Dc IVFs   Intervention Category Intermediate Interventions: Medication change / dose adjustment  ALVA,RAKESH V. 02/22/2013, 10:48 PM

## 2013-02-22 NOTE — Progress Notes (Signed)
Pt on trazodone at home to help with insomnia. Not ordered previously so MD on call notified and dose requested. Orders given. Will continue to monitor pt condition

## 2013-02-22 NOTE — Progress Notes (Signed)
PT demonstrated verbal and hands on understanding of Flutter device. 

## 2013-02-22 NOTE — Progress Notes (Signed)
PULMONARY  / CRITICAL CARE MEDICINE  Name: Bianca Haley MRN: [id number] DOB: 1950/06/16    ADMISSION DATE:  02/21/2013   CHIEF COMPLAINT:  gen weakness, chills  PCP - Norins   HISTORY OF PRESENT ILLNESS:   55 never smoker referred for evaluation of chronic cough x . She reports a dry cough , no specific triggers, no throat clearing x 6 months, no relieving factors. She was diagnosed with RLL community acquired pneumonia on 01/04/13 & treated with levaquin with clearing of infiltrate on 01/26/13. She reports cough much improved. However, developed shaking chills & low grade fever x 1 day. She feels weak & dizzy today & was lying supine on the exam table on my arrival. Hypotensive to 80 systolic. Took her atenolol & cardizem CD this am  CT chest 10/2009 >> Scattered areas of scar and bronchiectasis in both lungs,adjacent calcified granulomata in the right lower lobe  SUBJECTIVE:  Pt has a dry cough.  VITAL SIGNS: Temp:  [97.8 F (36.6 C)-98.4 F (36.9 C)] 98.4 F (36.9 C) (10/22 0600) Pulse Rate:  [59-76] 76 (10/22 1006) Resp:  [16-18] 18 (10/22 0600) BP: (92-174)/(60-73) 174/73 mmHg (10/22 1006) SpO2:  [92 %-97 %] 92 % (10/22 0600) Weight:  [60.1 kg (132 lb 7.9 oz)-61.281 kg (135 lb 1.6 oz)] 61.281 kg (135 lb 1.6 oz) (10/22 0600)  PHYSICAL EXAMINATION: Gen. Pleasant, well-nourished, in no distress, normal affect ENT - no lesions, no post nasal drip Neck: No JVD, no thyromegaly, no carotid bruits Lungs: no use of accessory muscles, no dullness to percussion, clear without rales or rhonchi  Cardiovascular: Rhythm regular, heart sounds  normal, no murmurs, no peripheral edema Abdomen: soft and non-tender, no hepatosplenomegaly, BS normal. Musculoskeletal: No deformities, no cyanosis or clubbing Neuro:  alert, non focal Skin:  Warm, no lesions/ rash    Recent Labs Lab 02/21/13 1409  NA 134*  K 3.6  CL 94*  CO2 29  BUN 20  CREATININE 1.32*  GLUCOSE 117*     Recent Labs Lab 02/21/13 1409  HGB 12.2  HCT 35.9*  WBC 14.6*  PLT 211   Dg Chest 2 View  02/21/2013   CLINICAL DATA:  Fever. Dry cough.  EXAM: CHEST  2 VIEW  COMPARISON:  01/26/2013  FINDINGS: Patchy areas of airspace opacity are noted in the right lower lobe the with less prominent areas of patchy airspace opacity in the right upper lobe. The left lung is clear. No pleural effusion or pneumothorax.  The cardiac silhouette is normal in size. The mediastinum is normal in contour.  The bony thorax is demineralized but intact.  IMPRESSION: 1. Multifocal pneumonia, most prominently involving the right lower lobe with smaller patchy areas of pneumonia in the right upper lobe.   Electronically Signed   By: Amie Portland M.D.   On: 02/21/2013 14:39    ASSESSMENT / PLAN:  Recurrent RLL pneumonia - ?etio - GERD/aspiration vs bronchiectasis related, note recently treated 01/2013 with clearing on FU CXR PCT high, Lactate normal Plan - IV levaquin F/u Culture, chk urine strep ag for completion chk CT CHEST now. Check IgG, M E A  Levels  Hypotension/ sepsis >>resolved hypotension- Resume atenolol   Keep as INPT status May need FOB Add flutter COnt iv levaquin Chk CT CHEST   Dorcas Carrow Beeper  [phone]  Cell  [phone]  If no response or cell goes to voicemail, call beeper [phone]  Pulmonary and Critical Care Medicine [facility] Pager: [phone]  02/22/2013, 12:01 PM

## 2013-02-23 ENCOUNTER — Inpatient Hospital Stay (HOSPITAL_COMMUNITY): Payer: BC Managed Care – PPO

## 2013-02-23 LAB — CBC WITH DIFFERENTIAL/PLATELET
Eosinophils Absolute: 0.1 10*3/uL (ref 0.0–0.7)
Eosinophils Relative: 2 % (ref 0–5)
HCT: 34.3 % — ABNORMAL LOW (ref 36.0–46.0)
Hemoglobin: 11.9 g/dL — ABNORMAL LOW (ref 12.0–15.0)
Lymphocytes Relative: 29 % (ref 12–46)
Lymphs Abs: 1.9 10*3/uL (ref 0.7–4.0)
MCH: 29.6 pg (ref 26.0–34.0)
MCV: 85.3 fL (ref 78.0–100.0)
Monocytes Relative: 6 % (ref 3–12)
Platelets: 222 10*3/uL (ref 150–400)
RBC: 4.02 MIL/uL (ref 3.87–5.11)
WBC: 6.4 10*3/uL (ref 4.0–10.5)

## 2013-02-23 LAB — BASIC METABOLIC PANEL
BUN: 7 mg/dL (ref 6–23)
CO2: 26 mEq/L (ref 19–32)
Calcium: 9.6 mg/dL (ref 8.4–10.5)
Chloride: 103 mEq/L (ref 96–112)
GFR calc non Af Amer: >90 mL/min (ref >90)
Glucose, Bld: 101 mg/dL — ABNORMAL HIGH (ref 70–99)
Sodium: 140 mEq/L (ref 135–145)

## 2013-02-23 LAB — IGE: IgE (Immunoglobulin E), Serum: 14.6 IU/mL (ref 0.0–180.0)

## 2013-02-23 LAB — IGG, IGA, IGM: IgM, Serum: 45 mg/dL — ABNORMAL LOW (ref 52–322)

## 2013-02-23 MED ORDER — POTASSIUM CHLORIDE CRYS ER 20 MEQ PO TBCR
40.0000 meq | EXTENDED_RELEASE_TABLET | ORAL | Status: AC
  Administered 2013-02-23 (×2): 40 meq via ORAL
  Filled 2013-02-23 (×2): qty 2

## 2013-02-23 MED ORDER — ALPRAZOLAM 0.25 MG PO TABS
0.2500 mg | ORAL_TABLET | Freq: Three times a day (TID) | ORAL | Status: DC | PRN
  Administered 2013-02-23 (×2): 0.25 mg via ORAL
  Filled 2013-02-23 (×2): qty 1

## 2013-02-23 MED ORDER — HYDRALAZINE HCL 20 MG/ML IJ SOLN
10.0000 mg | Freq: Once | INTRAMUSCULAR | Status: AC
  Administered 2013-02-23: 10 mg via INTRAVENOUS
  Filled 2013-02-23: qty 1

## 2013-02-23 MED ORDER — LEVOFLOXACIN IN D5W 750 MG/150ML IV SOLN
750.0000 mg | INTRAVENOUS | Status: DC
  Administered 2013-02-23 – 2013-02-24 (×2): 750 mg via INTRAVENOUS
  Filled 2013-02-23 (×2): qty 150

## 2013-02-23 MED ORDER — DILTIAZEM HCL 90 MG PO TABS
90.0000 mg | ORAL_TABLET | Freq: Three times a day (TID) | ORAL | Status: DC
  Administered 2013-02-23 – 2013-02-24 (×3): 90 mg via ORAL
  Filled 2013-02-23 (×5): qty 1

## 2013-02-23 MED ORDER — HYDRALAZINE HCL 20 MG/ML IJ SOLN
10.0000 mg | INTRAMUSCULAR | Status: DC | PRN
  Administered 2013-02-23: 10 mg via INTRAVENOUS
  Filled 2013-02-23: qty 1

## 2013-02-23 NOTE — Progress Notes (Signed)
PULMONARY  / CRITICAL CARE MEDICINE  Name: Bianca Haley MRN: [id number] DOB: Aug 05, 1950    ADMISSION DATE:  02/21/2013   CHIEF COMPLAINT:  gen weakness, chills  PCP - Norins   HISTORY OF PRESENT ILLNESS:   81 never smoker referred for evaluation of chronic cough x . She reports a dry cough , no specific triggers, no throat clearing x 6 months, no relieving factors. She was diagnosed with RLL community acquired pneumonia on 01/04/13 & treated with levaquin with clearing of infiltrate on 01/26/13. She reports cough much improved. However, developed shaking chills & low grade fever x 1 day. She feels weak & dizzy today & was lying supine on the exam table on my arrival. Hypotensive to 80 systolic. Took her atenolol & cardizem CD this am  CT chest 10/2009 >> Scattered areas of scar and bronchiectasis in both lungs,adjacent calcified granulomata in the right lower lobe CT chest 10/22: some progression of BTX RML/RLL airspace disease Echo 10/23>>> esophagram 10/23>>>  SUBJECTIVE:  C/o h/a and insomnia   VITAL SIGNS: Temp:  [98 F (36.7 C)-98.8 F (37.1 C)] 98.8 F (37.1 C) (10/23 0828) Pulse Rate:  [60-82] 82 (10/23 0828) Resp:  [16-20] 20 (10/23 0828) BP: (155-211)/(77-98) 169/98 mmHg (10/23 0937) SpO2:  [92 %-96 %] 96 % (10/23 0828) Weight:  [60.102 kg (132 lb 8 oz)] 60.102 kg (132 lb 8 oz) (10/23 0600) Room air  PHYSICAL EXAMINATION: Gen. Pleasant, well-nourished, in no distress, normal affect ENT - no lesions, no post nasal drip Neck: No JVD, no thyromegaly, no carotid bruits Lungs: Fine crackles right base Cardiovascular: Rhythm regular, heart sounds  normal, no murmurs, no peripheral edema Abdomen: soft and non-tender, no hepatosplenomegaly, BS normal. Musculoskeletal: No deformities, no cyanosis or clubbing Neuro:  alert, non focal Skin:  Warm, no lesions/ rash    Recent Labs Lab 02/21/13 1409 02/23/13 0441  NA 134* 140  K 3.6 3.0*  CL 94* 103  CO2 29 26   BUN 20 7  CREATININE 1.32* 0.62  GLUCOSE 117* 101*    Recent Labs Lab 02/21/13 1409 02/23/13 0441  HGB 12.2 11.9*  HCT 35.9* 34.3*  WBC 14.6* 6.4  PLT 211 222   Dg Chest 2 View  02/21/2013   CLINICAL DATA:  Fever. Dry cough.  EXAM: CHEST  2 VIEW  COMPARISON:  01/26/2013  FINDINGS: Patchy areas of airspace opacity are noted in the right lower lobe the with less prominent areas of patchy airspace opacity in the right upper lobe. The left lung is clear. No pleural effusion or pneumothorax.  The cardiac silhouette is normal in size. The mediastinum is normal in contour.  The bony thorax is demineralized but intact.  IMPRESSION: 1. Multifocal pneumonia, most prominently involving the right lower lobe with smaller patchy areas of pneumonia in the right upper lobe.   Electronically Signed   By: Amie Portland M.D.   On: 02/21/2013 14:39   Ct Chest Wo Contrast  02/22/2013   CLINICAL DATA:  Recurrent pneumonia.  EXAM: CT CHEST WITHOUT CONTRAST  TECHNIQUE: Multidetector CT imaging of the chest was performed following the standard protocol without IV contrast.  COMPARISON:  Plain film 02/21/2013. CT 10/22/2009.  FINDINGS: Airspace opacities are noted in the right lower lobe with associated interstitial thickening. Ground-glass and somewhat nodular airspace opacities are also noted in the right middle lobe. Findings most compatible with pneumonia. No endobronchial lesion or central obstructing mass. No pleural effusions. There are mildly prominent mediastinal lymph nodes.  These have enlarged since prior study. Pretracheal lymph node on image 15 measures 10 mm. Right paratracheal lymph node on image 20 measures 10 mm. Subcarinal lymph node on image 26 measures 13 mm. No definite hilar adenopathy or axillary adenopathy.  Heart is upper limits normal in size. Calcifications in the aortic arch. No aneurysm. No visible coronary artery calcifications. Chest wall soft tissues are unremarkable.  Imaging into the  upper abdomen demonstrates fluid around the gallbladder versus gallbladder wall thickening which is partially imaged. No visible stones.  IMPRESSION: Airspace opacities within the right middle lobe and right lower lobe compatible with pneumonia. No central obstructing process or endobronchial lesion. No effusion.  Borderline mediastinal lymph nodes, most likely reactive.  Pericholecystic fluid versus gallbladder wall thickening. Gallbladder is only partially imaged on this chest CT. Recommend clinical correlation. May consider abdominal ultrasound if there is clinical concern for cholecystitis.   Electronically Signed   By: Charlett Nose M.D.   On: 02/22/2013 19:39    ASSESSMENT / PLAN:  Recurrent RLL pneumonia -note recently treated 01/2013 with clearing on FU CXR.  >CT chest c/w PNA, mostly  >? Reflux/ aspiration related  Plan  - cont  levaquin - F/u Culture, chk urine strep ag for completion - esophagram today  - f/u cxr in am (2 view)  HTN. -still elevated  >some of this seems situational, but still concerning Plan -will titrate up dilt, cont atenolol -get echo  Anxiety/ insomnia  Plan -add low dose xanax     Anders Simmonds ACNP-BC [facility] Pulmonary/Critical Care Pager # [phone] OR # [phone] if no answer  Levy Pupa, MD, PhD 02/23/2013, 3:00 PM [facility] [phone] or if no answer [phone]

## 2013-02-23 NOTE — Progress Notes (Signed)
ANTIBIOTIC CONSULT NOTE  Pharmacy Consult for Renal Adjustment of Antibiotics Indication: pneumonia  10/21 >> Levaquin >>  Assessment:  62 yoF on day #3 IV Levaquin 750 mg IV q48h for PNA.  SCr improved today: 1.36 > 0.62, CrCl~65 ml/min  Plan:  Adjust Levaquin to 750 mg IV q24h.    Clance Boll 02/23/2013,11:39 AM

## 2013-02-23 NOTE — Progress Notes (Signed)
BP 196/84 with map 120 . HR 82  Plan hydralzine 10mg  IV x1,  Goal sbp > 140  Dr. Kalman Shan, M.D., [facility].C.P Pulmonary and Critical Care Medicine Staff Physician [facility] and [facility] Pager: [phone], If no answer or between  15:00h - 7:00h: call [phone]  02/23/2013 5:13 AM

## 2013-02-23 NOTE — Progress Notes (Signed)
*  PRELIMINARY RESULTS* Echocardiogram 2D Echocardiogram has been performed.  Bianca Haley 02/23/2013, 3:13 PM

## 2013-02-24 ENCOUNTER — Inpatient Hospital Stay (HOSPITAL_COMMUNITY): Payer: BC Managed Care – PPO

## 2013-02-24 DIAGNOSIS — I5189 Other ill-defined heart diseases: Secondary | ICD-10-CM

## 2013-02-24 LAB — CBC
HCT: 39.9 % (ref 36.0–46.0)
Hemoglobin: 13.5 g/dL (ref 12.0–15.0)
MCH: 29.1 pg (ref 26.0–34.0)
MCHC: 33.8 g/dL (ref 30.0–36.0)
MCV: 86 fL (ref 78.0–100.0)
Platelets: 283 10*3/uL (ref 150–400)
RBC: 4.64 MIL/uL (ref 3.87–5.11)

## 2013-02-24 LAB — BASIC METABOLIC PANEL
CO2: 25 mEq/L (ref 19–32)
Calcium: 10.3 mg/dL (ref 8.4–10.5)
Chloride: 101 mEq/L (ref 96–112)
Creatinine, Ser: 0.76 mg/dL (ref 0.50–1.10)
GFR calc non Af Amer: 87 mL/min — ABNORMAL LOW (ref >90)
Glucose, Bld: 105 mg/dL — ABNORMAL HIGH (ref 70–99)

## 2013-02-24 MED ORDER — LEVOFLOXACIN 750 MG PO TABS: 750.0000 mg | ORAL_TABLET | Freq: Every day | ORAL | Status: DC

## 2013-02-24 MED ORDER — DILTIAZEM HCL ER BEADS 240 MG PO CP24: 240.0000 mg | ORAL_CAPSULE | Freq: Every day | ORAL | Status: DC

## 2013-02-24 NOTE — Discharge Summary (Signed)
Physician Discharge Summary     Patient ID: Bianca Haley MRN: [id number] DOB/AGE: 1950/05/06 62 y.o.  Admit date: 02/21/2013 Discharge date: 02/24/2013  Discharge Diagnoses:  Active Problems:   ANXIETY   MIGRAINE HEADACHE   HYPERTENSION   CAP (community acquired pneumonia)   Sepsis   Reflux   Diastolic dysfunction- grade I  Detailed Hospital Course:   45 never smoker referred for evaluation of chronic cough x . She was admitted on 10/21 with reports a dry cough , no specific triggers, no throat clearing x 6 months, no relieving factors. She was diagnosed with RLL community acquired pneumonia on 01/04/13 & treated with levaquin with clearing of infiltrate on 01/26/13. She reports cough much improved. However, developed shaking chills & low grade fever x 1 day. She was weak & dizzy  & was lying supine on the exam table on Dr Reginia Naas arrival. Hypotensive to 80 systolic. Took her atenolol & cardizem CD that am. Dx eval was consistent with RML/RLL CAP. She was placed on empiric antibiotics and admitted to the medical ward. Her antihypertensives were held due to hypotension. She was given IVF bolus. During her stay her course was complicated by hypertension and we adjusted her diltiazem based on this. We obtained a CT chest that showed RML/RLL pneumonia and what appeared to be some progression of bronchiectasis. In effort to further evaluate potential causes of her recurrent pneumonia. We obtained an esophagram to eval for reflux and potential aspiration risk. This was negative. We also IgG,IgaA and IgM levels. We did find that her immunoglobin levels were low, but not to the extent we felt she was immunosuppressed. She ultimately improved with antibiotics. Her CXR improved and she was deemed stable for d/c as of 10/24 to home.     Discharge Plan by diagnoses   Recurrent RLL pneumonia -note recently treated 01/2013 with clearing on FU CXR.  >CT chest c/w PNA, mostly  Plan  -complete 10d  total abx (levaquin) -f/u with Parrett NP in our office  HTN.  Gd I diastolic dysfunction  Plan dilt titrated up She has f/u with Dr Debby Bud.   Anxiety/ insomnia  Plan: Home on typical regimen    Significant Hospital tests/ studies/ interventions and procedures  ECHO 10/23: EF 60-65%, doppler c/w grade I diastolic dysfxn.  Aortic valve: Mild regurgitation. - Mitral valve: Mild to moderate regurgitation. Esophagram 10/23: normal study CT chest 10/22: some progression of BTX RML/RLL airspace disease  Discharge Exam: BP 158/79  Pulse 83  Temp(Src) 98.4 F (36.9 C) (Oral)  Resp 18  Ht 5' 5.5" (1.664 m)  Wt 59.1 kg (130 lb 4.7 oz)  BMI 21.34 kg/m2  SpO2 95%  Gen. Pleasant, well-nourished, in no distress, normal affect  ENT - no lesions, no post nasal drip  Neck: No JVD, no thyromegaly, no carotid bruits  Lungs: Fine crackles right base, decreased  Cardiovascular: Rhythm regular, heart sounds normal, no murmurs, no peripheral edema  Abdomen: soft and non-tender, no hepatosplenomegaly, BS normal.  Musculoskeletal: No deformities, no cyanosis or clubbing  Neuro: alert, non focal  Skin: Warm, no lesions/ rash   Labs at discharge Lab Results  Component Value Date   CREATININE 0.76 02/24/2013   BUN 8 02/24/2013   NA 138 02/24/2013   K 3.6 02/24/2013   CL 101 02/24/2013   CO2 25 02/24/2013   Lab Results  Component Value Date   WBC 4.7 02/24/2013   HGB 13.5 02/24/2013   HCT 39.9 02/24/2013  MCV 86.0 02/24/2013   PLT 283 02/24/2013   Lab Results  Component Value Date   ALT 13 02/21/2013   AST 24 02/21/2013   ALKPHOS 52 02/21/2013   BILITOT 0.8 02/21/2013   Lab Results  Component Value Date   INR 1.04 02/21/2013    Current radiology studies Dg Chest 2 View  02/24/2013   CLINICAL DATA:  Evaluate pneumonia.  EXAM: CHEST  2 VIEW  COMPARISON:  02/21/2013 and CT 02/22/2013  FINDINGS: The patchy densities in the right lung have decreased. There are subtle  densities in the right upper lung and residual conspicuous densities in the right lower lung. Few densities along the left cardiac border may be chronic. Heart and mediastinum are stable. No significant pleural effusions.  IMPRESSION: Decreasing airspace densities in the right lung.   Electronically Signed   By: Richarda Overlie M.D.   On: 02/24/2013 08:24   Ct Chest Wo Contrast  02/22/2013   CLINICAL DATA:  Recurrent pneumonia.  EXAM: CT CHEST WITHOUT CONTRAST  TECHNIQUE: Multidetector CT imaging of the chest was performed following the standard protocol without IV contrast.  COMPARISON:  Plain film 02/21/2013. CT 10/22/2009.  FINDINGS: Airspace opacities are noted in the right lower lobe with associated interstitial thickening. Ground-glass and somewhat nodular airspace opacities are also noted in the right middle lobe. Findings most compatible with pneumonia. No endobronchial lesion or central obstructing mass. No pleural effusions. There are mildly prominent mediastinal lymph nodes. These have enlarged since prior study. Pretracheal lymph node on image 15 measures 10 mm. Right paratracheal lymph node on image 20 measures 10 mm. Subcarinal lymph node on image 26 measures 13 mm. No definite hilar adenopathy or axillary adenopathy.  Heart is upper limits normal in size. Calcifications in the aortic arch. No aneurysm. No visible coronary artery calcifications. Chest wall soft tissues are unremarkable.  Imaging into the upper abdomen demonstrates fluid around the gallbladder versus gallbladder wall thickening which is partially imaged. No visible stones.  IMPRESSION: Airspace opacities within the right middle lobe and right lower lobe compatible with pneumonia. No central obstructing process or endobronchial lesion. No effusion.  Borderline mediastinal lymph nodes, most likely reactive.  Pericholecystic fluid versus gallbladder wall thickening. Gallbladder is only partially imaged on this chest CT. Recommend clinical  correlation. May consider abdominal ultrasound if there is clinical concern for cholecystitis.   Electronically Signed   By: Charlett Nose M.D.   On: 02/22/2013 19:39   Dg Esophagus  02/23/2013   CLINICAL DATA:  Chronic cough  EXAM: ESOPHOGRAM / BARIUM SWALLOW / BARIUM TABLET STUDY  TECHNIQUE: Combined double contrast and single contrast examination performed using effervescent crystals, thick barium liquid, and thin barium liquid. The patient was observed with fluoroscopy swallowing a 13mm barium sulphate tablet.  COMPARISON:  None  FLUOROSCOPY TIME:  49 seconds  FINDINGS: The esophagus is patent. No stricture mass identified. A 13 mm barium tablet was ingested which promptly passed through the esophagus into the stomach. The motility of the esophagus is normal. The cervical portions of the esophagus appear normal. No reflux identified.  IMPRESSION: 1. Normal exam.   Electronically Signed   By: Signa Kell M.D.   On: 02/23/2013 14:59    Disposition:  01-Home or Self Care      Discharge Orders   Future Appointments Provider Department Dept Phone   03/02/2013 11:00 AM Julio Sicks, NP [facility] [phone]   03/23/2013 10:30 AM [facility] Nurse [facility]  [phone]   Future Orders Complete By Expires   Diet - low sodium heart healthy  As directed    Increase activity slowly  As directed        Medication List    STOP taking these medications       pneumococcal 23 valent vaccine 25 MCG/0.5ML injection  Commonly known as:  PNU-IMMUNE      TAKE these medications       atenolol 100 MG tablet  Commonly known as:  TENORMIN  TAKE 1 TABLET (100 MG TOTAL) BY MOUTH DAILY.     celecoxib 200 MG capsule  Commonly known as:  CELEBREX  Take 1 capsule (200 mg total) by mouth daily.     diltiazem 240 MG 24 hr capsule  Commonly known as:  TIAZAC  Take 1 capsule (240 mg total) by mouth daily.     gabapentin 300 MG capsule  Commonly known as:   NEURONTIN  Take 300-900 mg by mouth 2 (two) times daily. Take 300 mg every morning AS NEEDED.  Take 900 mg every night at bedtime.     levofloxacin 750 MG tablet  Commonly known as:  LEVAQUIN  Take 1 tablet (750 mg total) by mouth daily.     meloxicam 15 MG tablet  Commonly known as:  MOBIC  Take 15 mg by mouth daily.     multivitamin with minerals Tabs tablet  Take 1 tablet by mouth daily.     oxymorphone 10 MG tablet  Commonly known as:  OPANA  Take 10 mg by mouth every 6 (six) hours as needed for pain.     pantoprazole 20 MG tablet  Commonly known as:  PROTONIX  TAKE 2 TABLETS BY MOUTH EVERY MORNING     PARoxetine 40 MG tablet  Commonly known as:  PAXIL  TAKE 1 TABLET (40 MG TOTAL) BY MOUTH 2 (TWO) TIMES DAILY.     simvastatin 40 MG tablet  Commonly known as:  ZOCOR  TAKE 1/2 TABLET BY MOUTH ONCE DAILY     SUMAtriptan 100 MG tablet  Commonly known as:  IMITREX  TAKE 1 TABLET BY MOUTH DAILY AS NEEDED FOR MIGRAINE     traZODone 50 MG tablet  Commonly known as:  DESYREL  Take 1 tablet (50 mg total) by mouth at bedtime as needed for sleep. In adidtion to 100 mg dose = 150 mg     traZODone 100 MG tablet  Commonly known as:  DESYREL  TAKE 1 TABLET BY MOUTH AT BEDTIME       Follow-up Information   Follow up with PARRETT,TAMMY, NP On 03/02/2013. (11am)    Specialty:  Nurse Practitioner   Contact information:   [address] [place] [address] [phone]       Discharged Condition: good  Physician Statement:   The Patient was personally examined, the discharge assessment and plan has been personally reviewed and I agree with ACNP Babcock's assessment and plan. > 30 minutes of time have been dedicated to discharge assessment, planning and discharge instructions.   Signed: BABCOCK,PETE 02/24/2013, 12:46 PM  I agree with the above note Luisa Hart WrightMD

## 2013-02-27 LAB — CULTURE, BLOOD (ROUTINE X 2): Culture: NO GROWTH

## 2013-03-02 ENCOUNTER — Ambulatory Visit (INDEPENDENT_AMBULATORY_CARE_PROVIDER_SITE_OTHER): Payer: BC Managed Care – PPO | Admitting: Adult Health

## 2013-03-02 ENCOUNTER — Encounter: Payer: Self-pay | Admitting: Adult Health

## 2013-03-02 ENCOUNTER — Ambulatory Visit (INDEPENDENT_AMBULATORY_CARE_PROVIDER_SITE_OTHER)
Admission: RE | Admit: 2013-03-02 | Discharge: 2013-03-02 | Disposition: A | Discharge status: Home / Self Care | Payer: BC Managed Care – PPO | Source: Ambulatory Visit | Attending: Adult Health | Admitting: Adult Health

## 2013-03-02 VITALS — BP 132/72 | HR 59 | Temp 98.1°F | Ht 63.5 in | Wt 139.4 lb

## 2013-03-02 DIAGNOSIS — J189 Pneumonia, unspecified organism: Secondary | ICD-10-CM

## 2013-03-02 NOTE — Patient Instructions (Signed)
Finish Levaquin as directed.  Claritin 10mg  daily As needed  Drainage.  follow up Dr. Vassie Loll  In 3 weeks with chest xray  Please contact office for sooner follow up if symptoms do not improve or worsen or seek emergency care

## 2013-03-02 NOTE — Progress Notes (Signed)
Subjective:    Patient ID: Bianca Haley, female    DOB: 12/21/1950, 62 y.o.   MRN: [id number]  HPI PCP - Norins 66 never smoker referred for evaluation of chronic cough x on 02/21/13   02/21/13 IOV  She reports a dry cough , no specific triggers, no throat clearing x 6 months, no relieving factors. She was diagnosed with RLL community acquired pneumonia on 01/04/13 & treated with levaquin with clearing of infiltrate on 01/26/13. She reports cough much improved. However, developed shaking chills & low grade fever x 1 day. She feels weak & dizzy today & was lying supine on the exam table on my arrival. Hypotensive to 80 systolic. Took her atenolol & cardizem CD this am  CT chest 10/2009 >> Scattered areas of scar and bronchiectasis in both lungs,adjacent calcified granulomata in the right lower lobe. >admitted    03/02/2013 Post Hospital follow up  Returns for post hospital follow up .  Feels better w/  no "breathing issues at all" tho still weak.  has 2 doses left of Levaquin left.  Was admitted 02/21/13 for recurrent RLL PNA .   She was placed on empiric antibiotics and admitted to the medical ward. Her antihypertensives were held due to hypotension. She was given IVF bolus. CT chest that showed RML/RLL pneumonia and what appeared to be some progression of bronchiectasis. Esophagram to eval for reflux and potential aspiration risk. This was negative. IgG,IgaA and IgM levels were low, but not to the extent we felt she was immunosuppressed. She ultimately improved with antibiotics. Her CXR improved prior to discharge . She was discharged on Levaquin 750mg  daily .  She is feeling better but still a little weak.  No fever, chest pain , n/v/d , choking , dysphagia.  No pets at home. No known occupational exposure. Never smoker.  CXR today wi/ residual infiltrates in RML /RLL   Pt does have chronic pain syndrome on opana, neurontin and trazodone .  Hx of alcohol abuse in past.   Review  of Systems Constitutional:   No  weight loss, night sweats,  Fevers, chills, + fatigue, or  lassitude.  HEENT:   No headaches,  Difficulty swallowing,  Tooth/dental problems, or  Sore throat,                No sneezing, itching, ear ache + nasal congestion, post nasal drip,   CV:  No chest pain,  Orthopnea, PND, swelling in lower extremities, anasarca, dizziness, palpitations, syncope.   GI  No heartburn, indigestion, abdominal pain, nausea, vomiting, diarrhea, change in bowel habits, loss of appetite, bloody stools.   Resp:  No coughing up of blood.  No change in color of mucus.  No wheezing.  No chest wall deformity  Skin: no rash or lesions.  GU: no dysuria, change in color of urine, no urgency or frequency.  No flank pain, no hematuria   MS:  No joint pain or swelling.  No decreased range of motion.  No back pain.  Psych:  No change in mood or affect. No depression or anxiety.  No memory loss.          Objective:   Physical Exam GEN: A/Ox3; pleasant , NAD, thin female   HEENT:  [place]/AT,  EACs-clear, TMs-wnl, NOSE-clear, THROAT-clear, no lesions, no postnasal drip or exudate noted.   NECK:  Supple w/ fair ROM; no JVD; normal carotid impulses w/o bruits; no thyromegaly or nodules palpated; no lymphadenopathy.  RESP  Clear  P & A; w/o, wheezes/ rales/ or rhonchi.no accessory muscle use, no dullness to percussion  CARD:  RRR, no m/r/g  , no peripheral edema, pulses intact, no cyanosis or clubbing.  GI:   Soft & nt; nml bowel sounds; no organomegaly or masses detected.  Musco: Warm bil, no deformities or joint swelling noted.   Neuro: alert, no focal deficits noted.    Skin: Warm, no lesions or rashes  CT chest 02/22/13  Airspace opacities within the right middle lobe and right lower lobe  compatible with pneumonia. No central obstructing process or  endobronchial lesion. No effusion.  Borderline mediastinal lymph nodes, most likely reactive.   CXR 03/02/2013  No  change in the small residual areas of pneumonia in the right middle and lower lobes.      Assessment & Plan:

## 2013-03-02 NOTE — Assessment & Plan Note (Addendum)
Recurrent RLL PNA  Clinically improving with abx treatment.  Esophagram was nml .  Pt is on chronic narotics/neuontin and trazodone ? Contributing to possible nocturnal silent aspiration . Hx of etoh abuse in pain ? Using  with narcotics   Plan  Finish Levaquin as directed.  Claritin 10mg  daily As needed  Drainage.  follow up Dr. Vassie Loll  In 3 weeks with chest xray  Please contact office for sooner follow up if symptoms do not improve or worsen or seek emergency care

## 2013-03-23 ENCOUNTER — Encounter: Payer: Self-pay | Admitting: Adult Health

## 2013-03-23 ENCOUNTER — Ambulatory Visit (INDEPENDENT_AMBULATORY_CARE_PROVIDER_SITE_OTHER): Payer: BC Managed Care – PPO | Admitting: Adult Health

## 2013-03-23 ENCOUNTER — Ambulatory Visit (INDEPENDENT_AMBULATORY_CARE_PROVIDER_SITE_OTHER)
Admission: RE | Admit: 2013-03-23 | Discharge: 2013-03-23 | Disposition: A | Discharge status: Home / Self Care | Payer: BC Managed Care – PPO | Source: Ambulatory Visit | Attending: Adult Health | Admitting: Adult Health

## 2013-03-23 ENCOUNTER — Ambulatory Visit (INDEPENDENT_AMBULATORY_CARE_PROVIDER_SITE_OTHER): Payer: BC Managed Care – PPO

## 2013-03-23 VITALS — BP 142/80 | HR 56 | Temp 98.5°F | Ht 65.5 in | Wt 131.6 lb

## 2013-03-23 DIAGNOSIS — J189 Pneumonia, unspecified organism: Secondary | ICD-10-CM

## 2013-03-23 DIAGNOSIS — Z23 Encounter for immunization: Secondary | ICD-10-CM

## 2013-03-23 NOTE — Assessment & Plan Note (Signed)
RML/RLL PNA -clinically improving with completed course of Levaquin   Plan  Check cxr today  Advance activity as tolerated.  follow up Dr. Vassie Loll  In 6 weeks and As needed   Please contact office for sooner follow up if symptoms do not improve or worsen or seek emergency care

## 2013-03-23 NOTE — Progress Notes (Signed)
Subjective:    Patient ID: Bianca Haley, female    DOB: [birth year], 62 y.o.   MRN: [id number]  HPI  PCP - Norins 80 never smoker referred for evaluation of chronic cough x on 02/21/13   02/21/13 IOV  She reports a dry cough , no specific triggers, no throat clearing x 6 months, no relieving factors. She was diagnosed with RLL community acquired pneumonia on 01/04/13 & treated with levaquin with clearing of infiltrate on 01/26/13. She reports cough much improved. However, developed shaking chills & low grade fever x 1 day. She feels weak & dizzy today & was lying supine on the exam table on my arrival. Hypotensive to 80 systolic. Took her atenolol & cardizem CD this am  CT chest 10/2009 >> Scattered areas of scar and bronchiectasis in both lungs,adjacent calcified granulomata in the right lower lobe. >admitted    03/02/13 Post Hospital follow up  Returns for post hospital follow up .  Feels better w/  no "breathing issues at all" tho still weak.  has 2 doses left of Levaquin left.  Was admitted 02/21/13 for recurrent RLL PNA .   She was placed on empiric antibiotics and admitted to the medical ward. Her antihypertensives were held due to hypotension. She was given IVF bolus. CT chest that showed RML/RLL pneumonia and what appeared to be some progression of bronchiectasis. Esophagram to eval for reflux and potential aspiration risk. This was negative. IgG,IgaA and IgM levels were low, but not to the extent we felt she was immunosuppressed. She ultimately improved with antibiotics. Her CXR improved prior to discharge . She was discharged on Levaquin 750mg  daily .  She is feeling better but still a little weak.  No fever, chest pain , n/v/d , choking , dysphagia.  No pets at home. No known occupational exposure. Never smoker.  CXR today wi/ residual infiltrates in RML /RLL   Pt does have chronic pain syndrome on opana, neurontin and trazodone .  Hx of alcohol abuse in past.  >>finish  Levaquin   03/23/2013 Follow up  Returns for 3 week follow up for RML /RLL PNA recent hospitalization. Has finished full course of Levaquin.  Says she is feeling much better but still tired a lot.  Denies hemoptysis, chest pain, orthopnea, edema.  Good appetite , no n/v/d.      Review of Systems  Constitutional:   No  weight loss, night sweats,  Fevers, chills, + fatigue, or  lassitude.  HEENT:   No headaches,  Difficulty swallowing,  Tooth/dental problems, or  Sore throat,                No sneezing, itching, ear ache + nasal congestion, post nasal drip,   CV:  No chest pain,  Orthopnea, PND, swelling in lower extremities, anasarca, dizziness, palpitations, syncope.   GI  No heartburn, indigestion, abdominal pain, nausea, vomiting, diarrhea, change in bowel habits, loss of appetite, bloody stools.   Resp:  No coughing up of blood.  No change in color of mucus.  No wheezing.  No chest wall deformity  Skin: no rash or lesions.  GU: no dysuria, change in color of urine, no urgency or frequency.  No flank pain, no hematuria   MS:  No joint pain or swelling.  No decreased range of motion.  No back pain.  Psych:  No change in mood or affect. No depression or anxiety.  No memory loss.  Objective:   Physical Exam  GEN: A/Ox3; pleasant , NAD, thin female   HEENT:  /AT,  EACs-clear, TMs-wnl, NOSE-clear, THROAT-clear, no lesions, no postnasal drip or exudate noted.   NECK:  Supple w/ fair ROM; no JVD; normal carotid impulses w/o bruits; no thyromegaly or nodules palpated; no lymphadenopathy.  RESP  Clear  P & A; w/o, wheezes/ rales/ or rhonchi.no accessory muscle use, no dullness to percussion  CARD:  RRR, no m/r/g  , no peripheral edema, pulses intact, no cyanosis or clubbing.  GI:   Soft & nt; nml bowel sounds; no organomegaly or masses detected.  Musco: Warm bil, no deformities or joint swelling noted.   Neuro: alert, no focal deficits noted.    Skin:  Warm, no lesions or rashes  CT chest 02/22/13  Airspace opacities within the right middle lobe and right lower lobe  compatible with pneumonia. No central obstructing process or  endobronchial lesion. No effusion.  Borderline mediastinal lymph nodes, most likely reactive.   CXR 03/02/13  No change in the small residual areas of pneumonia in the right middle and lower lobes.      Assessment & Plan:

## 2013-03-23 NOTE — Patient Instructions (Signed)
Xray today .  Claritin 10mg  daily As needed  Drainage.  follow up Dr. Vassie Loll  In 6 weeks and As needed   Please contact office for sooner follow up if symptoms do not improve or worsen or seek emergency care

## 2013-05-03 ENCOUNTER — Other Ambulatory Visit: Payer: Self-pay | Admitting: Internal Medicine

## 2013-05-11 ENCOUNTER — Telehealth: Payer: Self-pay | Admitting: *Deleted

## 2013-05-11 ENCOUNTER — Other Ambulatory Visit: Payer: Self-pay | Admitting: *Deleted

## 2013-05-11 MED ORDER — ALPRAZOLAM 0.5 MG PO TABS: 0.5000 mg | ORAL_TABLET | Freq: Four times a day (QID) | ORAL | Status: DC | PRN

## 2013-05-11 NOTE — Telephone Encounter (Signed)
Order phoned into pharmacy

## 2013-05-11 NOTE — Telephone Encounter (Signed)
Ok to call in Xanax 0.5 mg 1 q 6 hrs prn anxiety. # 100 2 refills - may call it in to her pharmacy

## 2013-05-11 NOTE — Telephone Encounter (Signed)
Patient phoned, requesting a script for "something for her nerves".  States that she is married to [name] ([facility]ins is familiar with this patient) and he is back in the hospital...states that MD should know the situation and understand reasoning for request.  Please advise.   CB# [phone]

## 2013-05-16 ENCOUNTER — Ambulatory Visit (INDEPENDENT_AMBULATORY_CARE_PROVIDER_SITE_OTHER): Payer: Self-pay | Admitting: Pulmonary Disease

## 2013-05-16 ENCOUNTER — Encounter: Payer: Self-pay | Admitting: Pulmonary Disease

## 2013-05-16 VITALS — BP 144/76 | HR 65 | Temp 98.3°F | Ht 65.0 in | Wt 135.2 lb

## 2013-05-16 DIAGNOSIS — J189 Pneumonia, unspecified organism: Secondary | ICD-10-CM

## 2013-05-16 NOTE — Patient Instructions (Signed)
Call us as needed.

## 2013-05-16 NOTE — Progress Notes (Signed)
   Subjective:    Patient ID: Bianca Haley, female    DOB: June 22, 1950, 63 y.o.   MRN: [id number]  HPI  PCP - Norins   79 never smoker referred for evaluation of chronic cough x 46mnths on 02/21/13  She was diagnosed with RLL community acquired pneumonia on 01/04/13 & treated with levaquin with clearing of infiltrate on 01/26/13.  Admitted 02/21/13 for recurrent RLL PNA , hypotension. CT chest showed RML/RLL pneumonia and what appeared to be some progression of bronchiectasis.  CT chest 10/2009 >> Scattered areas of scar and bronchiectasis in both lungs,adjacent calcified granulomata in the right lower lobe.  Esophagram was negative. IgG,IgaA and IgM levels were low, but not to the extent we felt she was immunosuppressed.    05/16/13 Returns for  follow up for RML /RLL PNA recent hospitalization.  Cough is gone,  No fevers CXR 03/23/13 clearing of infiltrates Husband is hospitalised after back surgery & ensuing complications Remains on pain meds but doing better   Review of Systems neg for any significant sore throat, dysphagia, itching, sneezing, nasal congestion or excess/ purulent secretions, fever, chills, sweats, unintended wt loss, pleuritic or exertional cp, hempoptysis, orthopnea pnd or change in chronic leg swelling. Also denies presyncope, palpitations, heartburn, abdominal pain, nausea, vomiting, diarrhea or change in bowel or urinary habits, dysuria,hematuria, rash, arthralgias, visual complaints, headache, numbness weakness or ataxia.      Objective:   Physical Exam  Gen. Pleasant, well-nourished, in no distress ENT - no lesions, no post nasal drip Neck: No JVD, no thyromegaly, no carotid bruits Lungs: no use of accessory muscles, no dullness to percussion, clear without rales or rhonchi  Cardiovascular: Rhythm regular, heart sounds  normal, no murmurs or gallops, no peripheral edema Musculoskeletal: No deformities, no cyanosis or clubbing         Assessment & Plan:

## 2013-05-17 NOTE — Assessment & Plan Note (Signed)
Recurrence  Raises possibility of aspiration related to pain meds Esophagram was negative Watchful observation for now - if recurs, need to have the difficult conversation about decreasing pain meds

## 2013-05-28 ENCOUNTER — Other Ambulatory Visit: Payer: Self-pay | Admitting: Internal Medicine

## 2013-06-17 ENCOUNTER — Ambulatory Visit (INDEPENDENT_AMBULATORY_CARE_PROVIDER_SITE_OTHER): Payer: 59 | Admitting: Nurse Practitioner

## 2013-06-17 ENCOUNTER — Encounter: Payer: Self-pay | Admitting: Nurse Practitioner

## 2013-06-17 VITALS — BP 150/90 | HR 81 | Temp 99.8°F | Resp 17 | Wt 125.5 lb

## 2013-06-17 DIAGNOSIS — J189 Pneumonia, unspecified organism: Secondary | ICD-10-CM

## 2013-06-17 MED ORDER — DOXYCYCLINE HYCLATE 100 MG PO TABS: 100.0000 mg | ORAL_TABLET | Freq: Two times a day (BID) | ORAL | Status: DC

## 2013-06-17 MED ORDER — ALBUTEROL SULFATE HFA 108 (90 BASE) MCG/ACT IN AERS: INHALATION_SPRAY | RESPIRATORY_TRACT | Status: DC

## 2013-06-17 MED ORDER — GUAIFENESIN 400 MG PO TABS: ORAL_TABLET | ORAL | Status: DC

## 2013-06-17 NOTE — Patient Instructions (Addendum)
I am treating you for pneumonia. Please start antibiotic. Eat yogurt daily, 2 hours after or before you take antibiotic, to help prevent diarrhea. Take humibid daily to help break up congestion. Continue to use inhalers as prescribed. Perform breathing/cough exercises 3-4 times daily -blow up balloon, then cough. Sip fluids every hour. Use cough syrup-Delsym- at night time only. Rest. Follow up with Dr. Elsworth Soho in 2 weeks or sooner if you feel worse.   Pneumonia, Adult Pneumonia is an infection of the lungs.  CAUSES Pneumonia may be caused by bacteria or a virus. Usually, these infections are caused by breathing infectious particles into the lungs (respiratory tract). SYMPTOMS   Cough.  Fever.  Chest pain.  Increased rate of breathing.  Wheezing.  Mucus production. DIAGNOSIS  If you have the common symptoms of pneumonia, your caregiver will typically confirm the diagnosis with a chest X-ray. The X-ray will show an abnormality in the lung (pulmonary infiltrate) if you have pneumonia. Other tests of your blood, urine, or sputum may be done to find the specific cause of your pneumonia. Your caregiver may also do tests (blood gases or pulse oximetry) to see how well your lungs are working. TREATMENT  Some forms of pneumonia may be spread to other people when you cough or sneeze. You may be asked to wear a mask before and during your exam. Pneumonia that is caused by bacteria is treated with antibiotic medicine. Pneumonia that is caused by the influenza virus may be treated with an antiviral medicine. Most other viral infections must run their course. These infections will not respond to antibiotics.  PREVENTION A pneumococcal shot (vaccine) is available to prevent a common bacterial cause of pneumonia. This is usually suggested for:  People over 55 years old.  Patients on chemotherapy.  People with chronic lung problems, such as bronchitis or emphysema.  People with immune system  problems. If you are over 65 or have a high risk condition, you may receive the pneumococcal vaccine if you have not received it before. In some countries, a routine influenza vaccine is also recommended. This vaccine can help prevent some cases of pneumonia.You may be offered the influenza vaccine as part of your care. If you smoke, it is time to quit. You may receive instructions on how to stop smoking. Your caregiver can provide medicines and counseling to help you quit. HOME CARE INSTRUCTIONS   Cough suppressants may be used if you are losing too much rest. However, coughing protects you by clearing your lungs. You should avoid using cough suppressants if you can.  Your caregiver may have prescribed medicine if he or she thinks your pneumonia is caused by a bacteria or influenza. Finish your medicine even if you start to feel better.  Your caregiver may also prescribe an expectorant. This loosens the mucus to be coughed up.  Only take over-the-counter or prescription medicines for pain, discomfort, or fever as directed by your caregiver.  Do not smoke. Smoking is a common cause of bronchitis and can contribute to pneumonia. If you are a smoker and continue to smoke, your cough may last several weeks after your pneumonia has cleared.  A cold steam vaporizer or humidifier in your room or home may help loosen mucus.  Coughing is often worse at night. Sleeping in a semi-upright position in a recliner or using a couple pillows under your head will help with this.  Get rest as you feel it is needed. Your body will usually let you know when  you need to rest. SEEK IMMEDIATE MEDICAL CARE IF:   Your illness becomes worse. This is especially true if you are elderly or weakened from any other disease.  You cannot control your cough with suppressants and are losing sleep.  You begin coughing up blood.  You develop pain which is getting worse or is uncontrolled with medicines.  You have a  fever.  Any of the symptoms which initially brought you in for treatment are getting worse rather than better.  You develop shortness of breath or chest pain. MAKE SURE YOU:   Understand these instructions.  Will watch your condition.  Will get help right away if you are not doing well or get worse. Document Released: 04/20/2005 Document Revised: 07/13/2011 Document Reviewed: 07/10/2010 [facility] Patient Information 2014 [place], [place].

## 2013-06-17 NOTE — Progress Notes (Signed)
   Subjective:    Patient ID: Bianca Haley, female    DOB: 13-Feb-1951, 63 y.o.   MRN: [id number]  Cough This is a new problem. The current episode started in the past 7 days (3d). The problem has been gradually worsening. The problem occurs hourly. The cough is productive of sputum. Associated symptoms include chest pain (pain w/inspiration), chills, ear congestion, a fever, myalgias, nasal congestion, shortness of breath and wheezing. Pertinent negatives include no ear pain, headaches, hemoptysis or sore throat. Nothing aggravates the symptoms. She has tried nothing for the symptoms. Her past medical history is significant for pneumonia (recurrent).      Review of Systems  Constitutional: Positive for fever, chills and fatigue.  HENT: Positive for congestion. Negative for ear pain and sore throat.   Respiratory: Positive for cough, shortness of breath and wheezing. Negative for hemoptysis and chest tightness.   Cardiovascular: Positive for chest pain (pain w/inspiration).  Gastrointestinal: Positive for nausea and vomiting (X2, yesterday, associated w/cough). Negative for diarrhea (loose stool).  Musculoskeletal: Positive for myalgias.  Neurological: Negative for headaches.       Objective:   Physical Exam  Vitals reviewed. Constitutional: She is oriented to person, place, and time. She appears well-developed and well-nourished. No distress.  HENT:  Head: Normocephalic and atraumatic.  Right Ear: External ear normal.  Left Ear: External ear normal.  RTM partially occluded by cerumen  Eyes: Conjunctivae are normal. Right eye exhibits no discharge. Left eye exhibits no discharge.  Neck: Normal range of motion. Neck supple. No thyromegaly present.  Cardiovascular: Normal rate, regular rhythm and normal heart sounds.   No murmur heard. Pulmonary/Chest: Effort normal. No respiratory distress. She has wheezes. She has rales (LLL). She exhibits no tenderness.  Prod cough during exam   Lymphadenopathy:    She has cervical adenopathy (bilat anterior cervical LAD).  Neurological: She is alert and oriented to person, place, and time.  Skin: Skin is warm and dry.  Psychiatric: She has a normal mood and affect. Her behavior is normal. Thought content normal.          Assessment & Plan:  1. CAP (community acquired pneumonia) Fever, cough, CP w/inspiration DD: flu - doxycycline (VIBRA-TABS) 100 MG tablet; Take 1 tablet (100 mg total) by mouth 2 (two) times daily.  Dispense: 28 tablet; Refill: 0 - guaifenesin (HUMIBID E) 400 MG TABS tablet; Take 1t po q8h for 5 days then q8h PRN chest congestion  Dispense: 56 tablet; Refill: 0 - albuterol (PROVENTIL HFA;VENTOLIN HFA) 108 (90 BASE) MCG/ACT inhaler; 2 puffs twice daily for 5 days, then q6h PRN cough/wheeze  Dispense: 1 Inhaler; Refill: 0  See pt instructions.

## 2013-06-17 NOTE — Progress Notes (Signed)
Pre visit review using our clinic review tool, if applicable. No additional management support is needed unless otherwise documented below in the visit note. 

## 2013-06-26 ENCOUNTER — Telehealth: Payer: Self-pay | Admitting: Pulmonary Disease

## 2013-06-26 NOTE — Telephone Encounter (Signed)
I spoke with the pt and she states she went to [facility] and was diagnosed with pneumonia over the weekend. She states they placed her on doxycycline and mucinex. Pt also advised to set up f/u appt with RA in 2 weeks which she has done. Pt wanted to know should she continue the doxy or would another abx be recommended. Pt states she is feeling some improvement in symptoms, but she is still having fatigue. I advised the pt to continue the doxy course. Advised if her symptoms do not continue to improve or get worse at the end of the course to call for OV. [place] Walk Bing, CMA

## 2013-06-28 ENCOUNTER — Other Ambulatory Visit: Payer: Self-pay | Admitting: Internal Medicine

## 2013-06-30 ENCOUNTER — Telehealth: Payer: Self-pay | Admitting: Pulmonary Disease

## 2013-06-30 ENCOUNTER — Ambulatory Visit (INDEPENDENT_AMBULATORY_CARE_PROVIDER_SITE_OTHER)
Admission: RE | Admit: 2013-06-30 | Discharge: 2013-06-30 | Disposition: A | Discharge status: Home / Self Care | Payer: 59 | Source: Ambulatory Visit | Attending: Pulmonary Disease | Admitting: Pulmonary Disease

## 2013-06-30 DIAGNOSIS — R0602 Shortness of breath: Secondary | ICD-10-CM

## 2013-06-30 NOTE — Telephone Encounter (Signed)
Obtain CXR today in office,  If abnml , will need more abx otherwise not

## 2013-06-30 NOTE — Telephone Encounter (Signed)
Spoke with the pt  She was recently dxed with PNA and taking her last dose of doxy  She has ov with RA on 07/20/13 for f/u Her cough has improved and is now non prod, energy level and appetite coming back  She does however, c/o hearing "crackles and pops in upper and lower lungs" She can hear these when she does "breathing exercises" Denies any increased SOB  Would like "more medicine"  Please advise thanks!

## 2013-06-30 NOTE — Telephone Encounter (Signed)
Pt aware and CXR ordered. Nothing further needed

## 2013-07-20 ENCOUNTER — Ambulatory Visit (INDEPENDENT_AMBULATORY_CARE_PROVIDER_SITE_OTHER): Payer: 59 | Admitting: Pulmonary Disease

## 2013-07-20 ENCOUNTER — Encounter: Payer: Self-pay | Admitting: Pulmonary Disease

## 2013-07-20 VITALS — BP 140/82 | HR 59 | Ht 65.0 in | Wt 139.0 lb

## 2013-07-20 DIAGNOSIS — J189 Pneumonia, unspecified organism: Secondary | ICD-10-CM

## 2013-07-20 NOTE — Progress Notes (Signed)
   Subjective:    Patient ID: Bianca Haley, female    DOB: [birth year], 63 y.o.   MRN: [id number]  HPI  PCP - Norins   63 never smoker first seen for chronic cough x 28mnths on 02/21/13  She was diagnosed with RLL community acquired pneumonia on 01/04/13 & treated with levaquin with clearing of infiltrate on 01/26/13.  Admitted 02/21/13 for recurrent RLL PNA , hypotension. CT chest showed RML/RLL pneumonia and what appeared to be some progression of bronchiectasis.  CT chest 10/2009 >> Scattered areas of scar and bronchiectasis in both lungs,adjacent calcified granulomata in the right lower lobe.  Esophagram was negative. IgG,IgaA and IgM levels were low, but not to the extent we felt she was  immunosuppressed.  CXR 03/23/13 clearing of infiltrates      07/20/2013  06/2013 - she went to [facility] and was diagnosed with pneumonia over the weekend. She states they placed her on doxycycline and mucinex. Her cough improved and is now non prod, energy level and appetite coming back  She does however, c/o hearing "crackles and pops in upper and lower lungs" CXR 06/30/13 clear - denied further abx Takes opana once in am Neurontin daily, xanax helped with anxiety Needs protonix daily- dietary avoidance & changes have been made for GERD SO was hospitalised after back surgery & ensuing complications, now they live separately   Review of Systems neg for any significant sore throat, dysphagia, itching, sneezing, nasal congestion or excess/ purulent secretions, fever, chills, sweats, unintended wt loss, pleuritic or exertional cp, hempoptysis, orthopnea pnd or change in chronic leg swelling. Also denies presyncope, palpitations, heartburn, abdominal pain, nausea, vomiting, diarrhea or change in bowel or urinary habits, dysuria,hematuria, rash, arthralgias, visual complaints, headache, numbness weakness or ataxia.     Objective:   Physical Exam  Gen. Pleasant, well-nourished, in no distress ENT - no  lesions, no post nasal drip Neck: No JVD, no thyromegaly, no carotid bruits Lungs: no use of accessory muscles, no dullness to percussion, clear without rales or rhonchi  Cardiovascular: Rhythm regular, heart sounds  normal, no murmurs or gallops, no peripheral edema Musculoskeletal: No deformities, no cyanosis or clubbing         Assessment & Plan:

## 2013-07-20 NOTE — Patient Instructions (Signed)
Call for first signs of pneumonia  Albuterol as needed only for wheezing

## 2013-07-24 NOTE — Assessment & Plan Note (Signed)
Call for first signs of pneumonia  Albuterol as needed only for wheezing Silent aspiration remains a possibility, she does not appear to be immunocompromised

## 2013-07-31 ENCOUNTER — Other Ambulatory Visit: Payer: Self-pay | Admitting: Internal Medicine

## 2013-08-28 ENCOUNTER — Other Ambulatory Visit: Payer: Self-pay | Admitting: *Deleted

## 2013-08-28 MED ORDER — TRAZODONE HCL 50 MG PO TABS: 50.0000 mg | ORAL_TABLET | Freq: Every evening | ORAL | Status: DC | PRN

## 2013-09-12 ENCOUNTER — Ambulatory Visit: Payer: 59 | Admitting: Physician Assistant

## 2013-09-13 ENCOUNTER — Other Ambulatory Visit: Payer: Self-pay

## 2013-09-26 ENCOUNTER — Other Ambulatory Visit: Payer: Self-pay | Admitting: Dermatology

## 2013-09-27 ENCOUNTER — Emergency Department (HOSPITAL_COMMUNITY): Payer: Medicare Other

## 2013-09-27 ENCOUNTER — Encounter (HOSPITAL_COMMUNITY): Payer: Self-pay | Admitting: Emergency Medicine

## 2013-09-27 ENCOUNTER — Inpatient Hospital Stay (HOSPITAL_COMMUNITY)
Admission: EM | Admit: 2013-09-27 | Discharge: 2013-09-29 | DRG: 177 | Disposition: A | Discharge status: Home / Self Care | Payer: Medicare Other | Attending: Internal Medicine | Admitting: Internal Medicine

## 2013-09-27 ENCOUNTER — Inpatient Hospital Stay (HOSPITAL_COMMUNITY): Payer: Medicare Other

## 2013-09-27 DIAGNOSIS — G8929 Other chronic pain: Secondary | ICD-10-CM

## 2013-09-27 DIAGNOSIS — D259 Leiomyoma of uterus, unspecified: Secondary | ICD-10-CM

## 2013-09-27 DIAGNOSIS — I10 Essential (primary) hypertension: Secondary | ICD-10-CM

## 2013-09-27 DIAGNOSIS — R109 Unspecified abdominal pain: Secondary | ICD-10-CM

## 2013-09-27 DIAGNOSIS — M549 Dorsalgia, unspecified: Secondary | ICD-10-CM | POA: Diagnosis present

## 2013-09-27 DIAGNOSIS — J984 Other disorders of lung: Secondary | ICD-10-CM

## 2013-09-27 DIAGNOSIS — IMO0001 Reserved for inherently not codable concepts without codable children: Secondary | ICD-10-CM

## 2013-09-27 DIAGNOSIS — I872 Venous insufficiency (chronic) (peripheral): Secondary | ICD-10-CM

## 2013-09-27 DIAGNOSIS — Z79899 Other long term (current) drug therapy: Secondary | ICD-10-CM | POA: Diagnosis not present

## 2013-09-27 DIAGNOSIS — E876 Hypokalemia: Secondary | ICD-10-CM

## 2013-09-27 DIAGNOSIS — N179 Acute kidney failure, unspecified: Secondary | ICD-10-CM

## 2013-09-27 DIAGNOSIS — J069 Acute upper respiratory infection, unspecified: Secondary | ICD-10-CM

## 2013-09-27 DIAGNOSIS — Z8619 Personal history of other infectious and parasitic diseases: Secondary | ICD-10-CM

## 2013-09-27 DIAGNOSIS — D649 Anemia, unspecified: Secondary | ICD-10-CM

## 2013-09-27 DIAGNOSIS — G9340 Encephalopathy, unspecified: Secondary | ICD-10-CM | POA: Diagnosis present

## 2013-09-27 DIAGNOSIS — G43909 Migraine, unspecified, not intractable, without status migrainosus: Secondary | ICD-10-CM

## 2013-09-27 DIAGNOSIS — R05 Cough: Secondary | ICD-10-CM

## 2013-09-27 DIAGNOSIS — I5189 Other ill-defined heart diseases: Secondary | ICD-10-CM

## 2013-09-27 DIAGNOSIS — R110 Nausea: Secondary | ICD-10-CM

## 2013-09-27 DIAGNOSIS — Z8249 Family history of ischemic heart disease and other diseases of the circulatory system: Secondary | ICD-10-CM | POA: Diagnosis not present

## 2013-09-27 DIAGNOSIS — R509 Fever, unspecified: Secondary | ICD-10-CM

## 2013-09-27 DIAGNOSIS — Z87448 Personal history of other diseases of urinary system: Secondary | ICD-10-CM

## 2013-09-27 DIAGNOSIS — Z8701 Personal history of pneumonia (recurrent): Secondary | ICD-10-CM | POA: Diagnosis not present

## 2013-09-27 DIAGNOSIS — R0902 Hypoxemia: Secondary | ICD-10-CM

## 2013-09-27 DIAGNOSIS — J690 Pneumonitis due to inhalation of food and vomit: Principal | ICD-10-CM | POA: Diagnosis present

## 2013-09-27 DIAGNOSIS — G4734 Idiopathic sleep related nonobstructive alveolar hypoventilation: Secondary | ICD-10-CM | POA: Diagnosis present

## 2013-09-27 DIAGNOSIS — F411 Generalized anxiety disorder: Secondary | ICD-10-CM | POA: Diagnosis present

## 2013-09-27 DIAGNOSIS — R930 Abnormal findings on diagnostic imaging of skull and head, not elsewhere classified: Secondary | ICD-10-CM | POA: Diagnosis present

## 2013-09-27 DIAGNOSIS — Z87440 Personal history of urinary (tract) infections: Secondary | ICD-10-CM | POA: Diagnosis not present

## 2013-09-27 DIAGNOSIS — H9319 Tinnitus, unspecified ear: Secondary | ICD-10-CM

## 2013-09-27 DIAGNOSIS — M542 Cervicalgia: Secondary | ICD-10-CM | POA: Diagnosis present

## 2013-09-27 DIAGNOSIS — K219 Gastro-esophageal reflux disease without esophagitis: Secondary | ICD-10-CM | POA: Diagnosis present

## 2013-09-27 DIAGNOSIS — N809 Endometriosis, unspecified: Secondary | ICD-10-CM

## 2013-09-27 DIAGNOSIS — R053 Chronic cough: Secondary | ICD-10-CM

## 2013-09-27 DIAGNOSIS — R4182 Altered mental status, unspecified: Secondary | ICD-10-CM | POA: Diagnosis present

## 2013-09-27 DIAGNOSIS — Z9189 Other specified personal risk factors, not elsewhere classified: Secondary | ICD-10-CM

## 2013-09-27 DIAGNOSIS — M720 Palmar fascial fibromatosis [Dupuytren]: Secondary | ICD-10-CM

## 2013-09-27 DIAGNOSIS — F1021 Alcohol dependence, in remission: Secondary | ICD-10-CM

## 2013-09-27 DIAGNOSIS — J189 Pneumonia, unspecified organism: Secondary | ICD-10-CM

## 2013-09-27 DIAGNOSIS — A419 Sepsis, unspecified organism: Secondary | ICD-10-CM | POA: Diagnosis present

## 2013-09-27 HISTORY — DX: Gastro-esophageal reflux disease without esophagitis: K21.9

## 2013-09-27 LAB — CBC WITH DIFFERENTIAL/PLATELET
BASOS ABS: 0 10*3/uL (ref 0.0–0.1)
Basophils Relative: 0 % (ref 0–1)
EOS ABS: 0.1 10*3/uL (ref 0.0–0.7)
Eosinophils Relative: 2 % (ref 0–5)
HEMATOCRIT: 40.4 % (ref 36.0–46.0)
Hemoglobin: 13.5 g/dL (ref 12.0–15.0)
LYMPHS ABS: 1 10*3/uL (ref 0.7–4.0)
Lymphocytes Relative: 18 % (ref 12–46)
MCH: 28 pg (ref 26.0–34.0)
MCHC: 33.4 g/dL (ref 30.0–36.0)
MCV: 83.8 fL (ref 78.0–100.0)
MONO ABS: 0.3 10*3/uL (ref 0.1–1.0)
Monocytes Relative: 6 % (ref 3–12)
NEUTROS ABS: 4.3 10*3/uL (ref 1.7–7.7)
Neutrophils Relative %: 74 % (ref 43–77)
Platelets: 185 10*3/uL (ref 150–400)
RBC: 4.82 MIL/uL (ref 3.87–5.11)
RDW: 12.9 % (ref 11.5–15.5)
WBC: 5.7 10*3/uL (ref 4.0–10.5)

## 2013-09-27 LAB — CSF CELL COUNT WITH DIFFERENTIAL
Eosinophils, CSF: 1 % (ref 0–1)
LYMPHS CSF: 7 % — AB (ref 40–80)
MONOCYTE-MACROPHAGE-SPINAL FLUID: 13 % — AB (ref 15–45)
RBC COUNT CSF: 10 /mm3 — AB
RBC COUNT CSF: 6000 /mm3 — AB
SEGMENTED NEUTROPHILS-CSF: 79 % — AB (ref 0–6)
WBC CSF: 0 /mm3 (ref 0–5)
WBC, CSF: 17 /mm3 (ref 0–5)

## 2013-09-27 LAB — RAPID URINE DRUG SCREEN, HOSP PERFORMED
Amphetamines: NOT DETECTED
Barbiturates: NOT DETECTED
Benzodiazepines: NOT DETECTED
Cocaine: NOT DETECTED
Opiates: NOT DETECTED
Tetrahydrocannabinol: NOT DETECTED

## 2013-09-27 LAB — URINALYSIS, ROUTINE W REFLEX MICROSCOPIC
Bilirubin Urine: NEGATIVE
Glucose, UA: NEGATIVE mg/dL
Hgb urine dipstick: NEGATIVE
Ketones, ur: NEGATIVE mg/dL
Leukocytes, UA: NEGATIVE
NITRITE: NEGATIVE
PH: 5.5 (ref 5.0–8.0)
Protein, ur: NEGATIVE mg/dL
SPECIFIC GRAVITY, URINE: 1.014 (ref 1.005–1.030)
Urobilinogen, UA: 0.2 mg/dL (ref 0.0–1.0)

## 2013-09-27 LAB — I-STAT VENOUS BLOOD GAS, ED
ACID-BASE EXCESS: 4 mmol/L — AB (ref 0.0–2.0)
Bicarbonate: 29.8 mEq/L — ABNORMAL HIGH (ref 20.0–24.0)
O2 Saturation: 49 %
TCO2: 31 mmol/L (ref 0–100)
pCO2, Ven: 46.2 mmHg (ref 45.0–50.0)
pH, Ven: 7.417 — ABNORMAL HIGH (ref 7.250–7.300)
pO2, Ven: 26 mmHg — CL (ref 30.0–45.0)

## 2013-09-27 LAB — GRAM STAIN

## 2013-09-27 LAB — CBC
HEMATOCRIT: 37.2 % (ref 36.0–46.0)
Hemoglobin: 13 g/dL (ref 12.0–15.0)
MCH: 28.6 pg (ref 26.0–34.0)
MCHC: 34.9 g/dL (ref 30.0–36.0)
MCV: 81.8 fL (ref 78.0–100.0)
Platelets: 191 10*3/uL (ref 150–400)
RBC: 4.55 MIL/uL (ref 3.87–5.11)
RDW: 12.7 % (ref 11.5–15.5)
WBC: 13.9 10*3/uL — ABNORMAL HIGH (ref 4.0–10.5)

## 2013-09-27 LAB — COMPREHENSIVE METABOLIC PANEL
ALBUMIN: 3.8 g/dL (ref 3.5–5.2)
ALT: 17 U/L (ref 0–35)
AST: 36 U/L (ref 0–37)
Alkaline Phosphatase: 94 U/L (ref 39–117)
BUN: 16 mg/dL (ref 6–23)
CALCIUM: 9.5 mg/dL (ref 8.4–10.5)
CO2: 27 mEq/L (ref 19–32)
CREATININE: 0.76 mg/dL (ref 0.50–1.10)
Chloride: 103 mEq/L (ref 96–112)
GFR calc Af Amer: >90 mL/min (ref >90)
GFR calc non Af Amer: 87 mL/min — ABNORMAL LOW (ref >90)
Glucose, Bld: 147 mg/dL — ABNORMAL HIGH (ref 70–99)
Potassium: 3.8 mEq/L (ref 3.7–5.3)
Sodium: 145 mEq/L (ref 137–147)
TOTAL PROTEIN: 7.3 g/dL (ref 6.0–8.3)
Total Bilirubin: 0.3 mg/dL (ref 0.3–1.2)

## 2013-09-27 LAB — PROCALCITONIN

## 2013-09-27 LAB — STREP PNEUMONIAE URINARY ANTIGEN: Strep Pneumo Urinary Antigen: NEGATIVE

## 2013-09-27 LAB — PROTEIN, CSF: Total  Protein, CSF: 38 mg/dL (ref 15–45)

## 2013-09-27 LAB — ETHANOL

## 2013-09-27 LAB — CREATININE, SERUM
Creatinine, Ser: 0.62 mg/dL (ref 0.50–1.10)
GFR calc Af Amer: >90 mL/min (ref >90)

## 2013-09-27 LAB — PROTIME-INR
INR: 1.01 (ref 0.00–1.49)
Prothrombin Time: 13.1 seconds (ref 11.6–15.2)

## 2013-09-27 LAB — MAGNESIUM: MAGNESIUM: 1.5 mg/dL (ref 1.5–2.5)

## 2013-09-27 LAB — I-STAT CG4 LACTIC ACID, ED: LACTIC ACID, VENOUS: 2.02 mmol/L (ref 0.5–2.2)

## 2013-09-27 LAB — MRSA PCR SCREENING: MRSA by PCR: NEGATIVE

## 2013-09-27 LAB — GLUCOSE, CSF: GLUCOSE CSF: 81 mg/dL — AB (ref 43–76)

## 2013-09-27 MED ORDER — ADULT MULTIVITAMIN W/MINERALS CH
1.0000 | ORAL_TABLET | Freq: Every day | ORAL | Status: DC
  Administered 2013-09-28 – 2013-09-29 (×2): 1 via ORAL
  Filled 2013-09-27 (×2): qty 1

## 2013-09-27 MED ORDER — ENOXAPARIN SODIUM 40 MG/0.4ML ~~LOC~~ SOLN
40.0000 mg | SUBCUTANEOUS | Status: DC
  Administered 2013-09-27 – 2013-09-28 (×2): 40 mg via SUBCUTANEOUS
  Filled 2013-09-27 (×3): qty 0.4

## 2013-09-27 MED ORDER — SODIUM CHLORIDE 0.9 % IV SOLN
1000.0000 mL | Freq: Once | INTRAVENOUS | Status: AC
  Administered 2013-09-27: 1000 mL via INTRAVENOUS

## 2013-09-27 MED ORDER — GADOBENATE DIMEGLUMINE 529 MG/ML IV SOLN
15.0000 mL | Freq: Once | INTRAVENOUS | Status: AC
  Administered 2013-09-27: 15 mL via INTRAVENOUS

## 2013-09-27 MED ORDER — SUMATRIPTAN SUCCINATE 100 MG PO TABS
100.0000 mg | ORAL_TABLET | ORAL | Status: DC | PRN
  Filled 2013-09-27: qty 1

## 2013-09-27 MED ORDER — DILTIAZEM HCL ER COATED BEADS 180 MG PO CP24
180.0000 mg | ORAL_CAPSULE | Freq: Every day | ORAL | Status: DC
  Administered 2013-09-27 – 2013-09-29 (×3): 180 mg via ORAL
  Filled 2013-09-27 (×3): qty 1

## 2013-09-27 MED ORDER — DEXTROSE 5 % IV SOLN
10.0000 mg/kg | Freq: Once | INTRAVENOUS | Status: AC
  Administered 2013-09-27: 630 mg via INTRAVENOUS
  Filled 2013-09-27: qty 12.6

## 2013-09-27 MED ORDER — DEXTROSE 5 % IV SOLN
10.0000 mg/kg | Freq: Three times a day (TID) | INTRAVENOUS | Status: DC
  Administered 2013-09-27 – 2013-09-28 (×3): 630 mg via INTRAVENOUS
  Filled 2013-09-27 (×5): qty 12.6

## 2013-09-27 MED ORDER — ALPRAZOLAM 0.5 MG PO TABS: 0.5000 mg | ORAL_TABLET | Freq: Three times a day (TID) | ORAL | Status: DC | PRN

## 2013-09-27 MED ORDER — NALOXONE HCL 0.4 MG/ML IJ SOLN
0.4000 mg | Freq: Once | INTRAMUSCULAR | Status: AC
  Administered 2013-09-27: 0.4 mg via INTRAVENOUS

## 2013-09-27 MED ORDER — VANCOMYCIN HCL IN DEXTROSE 750-5 MG/150ML-% IV SOLN
750.0000 mg | Freq: Three times a day (TID) | INTRAVENOUS | Status: DC
  Administered 2013-09-27 – 2013-09-28 (×3): 750 mg via INTRAVENOUS
  Filled 2013-09-27 (×5): qty 150

## 2013-09-27 MED ORDER — NALOXONE HCL 0.4 MG/ML IJ SOLN
INTRAMUSCULAR | Status: AC
  Filled 2013-09-27: qty 1

## 2013-09-27 MED ORDER — PAROXETINE HCL 20 MG PO TABS
40.0000 mg | ORAL_TABLET | Freq: Two times a day (BID) | ORAL | Status: DC
  Administered 2013-09-27 – 2013-09-29 (×4): 40 mg via ORAL
  Filled 2013-09-27 (×5): qty 2

## 2013-09-27 MED ORDER — SODIUM CHLORIDE 0.9 % IV SOLN
1.0000 g | Freq: Three times a day (TID) | INTRAVENOUS | Status: DC
  Administered 2013-09-27: 1 g via INTRAVENOUS
  Filled 2013-09-27 (×3): qty 1

## 2013-09-27 MED ORDER — DILTIAZEM HCL ER BEADS 180 MG PO CP24: 180.0000 mg | ORAL_CAPSULE | Freq: Every day | ORAL | Status: DC

## 2013-09-27 MED ORDER — DEXTROSE 5 % IV SOLN
2.0000 g | Freq: Two times a day (BID) | INTRAVENOUS | Status: DC
  Filled 2013-09-27 (×2): qty 2

## 2013-09-27 MED ORDER — CELECOXIB 200 MG PO CAPS
200.0000 mg | ORAL_CAPSULE | Freq: Every day | ORAL | Status: DC
  Administered 2013-09-27 – 2013-09-29 (×3): 200 mg via ORAL
  Filled 2013-09-27 (×3): qty 1

## 2013-09-27 MED ORDER — DEXAMETHASONE SODIUM PHOSPHATE 10 MG/ML IJ SOLN
10.0000 mg | Freq: Once | INTRAMUSCULAR | Status: AC
  Administered 2013-09-27: 10 mg via INTRAVENOUS
  Filled 2013-09-27: qty 1

## 2013-09-27 MED ORDER — SODIUM CHLORIDE 0.9 % IV SOLN
2.0000 g | Freq: Three times a day (TID) | INTRAVENOUS | Status: DC
  Administered 2013-09-27 – 2013-09-29 (×6): 2 g via INTRAVENOUS
  Filled 2013-09-27 (×8): qty 2

## 2013-09-27 MED ORDER — DEXAMETHASONE SODIUM PHOSPHATE 10 MG/ML IJ SOLN
10.0000 mg | Freq: Four times a day (QID) | INTRAMUSCULAR | Status: DC
  Administered 2013-09-27 – 2013-09-28 (×4): 10 mg via INTRAVENOUS
  Filled 2013-09-27 (×8): qty 1

## 2013-09-27 MED ORDER — SIMVASTATIN 20 MG PO TABS
20.0000 mg | ORAL_TABLET | Freq: Every day | ORAL | Status: DC
  Administered 2013-09-27 – 2013-09-28 (×2): 20 mg via ORAL
  Filled 2013-09-27 (×3): qty 1

## 2013-09-27 MED ORDER — ATENOLOL 100 MG PO TABS
100.0000 mg | ORAL_TABLET | Freq: Every day | ORAL | Status: DC
  Administered 2013-09-27 – 2013-09-29 (×3): 100 mg via ORAL
  Filled 2013-09-27 (×3): qty 1

## 2013-09-27 MED ORDER — GABAPENTIN 300 MG PO CAPS
900.0000 mg | ORAL_CAPSULE | Freq: Every day | ORAL | Status: DC
  Administered 2013-09-27 – 2013-09-28 (×2): 900 mg via ORAL
  Filled 2013-09-27 (×4): qty 3

## 2013-09-27 MED ORDER — DEXTROSE 5 % IV SOLN
2.0000 g | Freq: Once | INTRAVENOUS | Status: AC
  Administered 2013-09-27: 2 g via INTRAVENOUS
  Filled 2013-09-27: qty 2

## 2013-09-27 MED ORDER — GABAPENTIN 300 MG PO CAPS
300.0000 mg | ORAL_CAPSULE | Freq: Every day | ORAL | Status: DC | PRN
  Filled 2013-09-27: qty 1

## 2013-09-27 MED ORDER — VANCOMYCIN HCL IN DEXTROSE 1-5 GM/200ML-% IV SOLN
1000.0000 mg | Freq: Once | INTRAVENOUS | Status: AC
  Administered 2013-09-27: 1000 mg via INTRAVENOUS
  Filled 2013-09-27: qty 200

## 2013-09-27 MED ORDER — PANTOPRAZOLE SODIUM 40 MG PO TBEC
40.0000 mg | DELAYED_RELEASE_TABLET | Freq: Every day | ORAL | Status: DC
  Administered 2013-09-28 – 2013-09-29 (×2): 40 mg via ORAL
  Filled 2013-09-27 (×2): qty 1

## 2013-09-27 MED ORDER — SODIUM CHLORIDE 0.9 % IV SOLN
1000.0000 mL | INTRAVENOUS | Status: DC
  Administered 2013-09-27 – 2013-09-28 (×4): 1000 mL via INTRAVENOUS

## 2013-09-27 MED ORDER — GABAPENTIN 300 MG PO CAPS
300.0000 mg | ORAL_CAPSULE | Freq: Two times a day (BID) | ORAL | Status: DC
Start: 2013-09-27 — End: 2013-09-27

## 2013-09-27 MED ORDER — ACETAMINOPHEN 650 MG RE SUPP
650.0000 mg | Freq: Once | RECTAL | Status: AC
  Administered 2013-09-27: 650 mg via RECTAL
  Filled 2013-09-27: qty 1

## 2013-09-27 NOTE — H&P (Signed)
[facility] History and Physical  Bianca Haley TFT:[id number] DOB: [birth year] DOA: 09/27/2013  Referring physician: Dr Mingo Amber PCP: [name], MD   Chief Complaint: AMS  HPI: Bianca Haley is a 63 y.o. female  With history of hypertension, migraine headaches, history of endometriosis, history of recurrent pneumonias with 4 episodes since October of 2014 who presents to the ED with altered mental status. Per patient, her fiancee patient was noted to have some subjective fevers, chills, right worse, wheezing around 3 AM on the morning of admission. Patient subsequently went to sleep patient woke up again around 5 AM with Reiter's with incoherent speech and a such her fianc called 911 and patient was brought to the emergency room. Patient does endorse a cough over the past 1-1/2 days with some subjective fevers and chills. Patient denies any shortness of breath, patient denies any neck stiffness, no slurred speech, no facial droop, no chest pain, no diarrhea, no constipation, no abdominal pain, no dysuria, no emesis, no diarrhea, no constipation. Patient does endorse some photophobia, some nausea and some generalized weakness. Patient was seen in the emergency room CT of the head which was obtained was negative for any hemorrhage or mass or midline shift or extra axial fluid collection however was concerning for possible subfrontal meningioma. Chest x-ray which was done showed right greater than left lower lung zone consolidation consistent with multifocal pneumonia. ED venous blood gas that had pH of 7.42 PCO2 of 46 PO2 of 26 bicarbonate of 29 and comprehensive metabolic profile obtained was unremarkable. Lactic acid level was 2.02. CBC obtained was unremarkable. Lumbar puncture was done per ED physician and patient placed empirically on IV vancomycin, Rocephin, acyclovir and Decadron. On arrival to the ED patient was noted to have a temperature of 104 with a heart rate of 138. Per ED  physician patient more alert and following commands. We were called to admit the patient for further evaluation and management. Patient is alert and following commands and answering questions appropriately.   Review of Systems: As per history of present illness otherwise negative. Constitutional:  No weight loss, night sweats, Fevers, chills, fatigue.  HEENT:  No headaches, Difficulty swallowing,Tooth/dental problems,Sore throat,  No sneezing, itching, ear ache, nasal congestion, post nasal drip,  Cardio-vascular:  No chest pain, Orthopnea, PND, swelling in lower extremities, anasarca, dizziness, palpitations  GI:  No heartburn, indigestion, abdominal pain, nausea, vomiting, diarrhea, change in bowel habits, loss of appetite  Resp:  No shortness of breath with exertion or at rest. No excess mucus, no productive cough, No non-productive cough, No coughing up of blood.No change in color of mucus.No wheezing.No chest wall deformity  Skin:  no rash or lesions.  GU:  no dysuria, change in color of urine, no urgency or frequency. No flank pain.  Musculoskeletal:  No joint pain or swelling. No decreased range of motion. No back pain.  Psych:  No change in mood or affect. No depression or anxiety. No memory loss.   Past Medical History  Diagnosis Date  . Hypertension   . Back pain   . Headache(784.0)   . Migraine   . Anemia   . Anxiety   . History of UTI   . History of endometriosis   . History of uterine fibroid   . H/O tinnitus     left  . GERD (gastroesophageal reflux disease)    Past Surgical History  Procedure Laterality Date  . Abdominal hysterectomy    . Bladder suspension    .  Back surgery    . Colonoscopy  02/16/2008    normal  . Laparoscopy      for evauation of endometriosis   Social History:  reports that she has never smoked. She has never used smokeless tobacco. She reports that she drinks alcohol. She reports that she does not use illicit drugs.  Allergies    Allergen Reactions  . Penicillins Shortness Of Breath and Rash  . Morphine And Related Swelling    Severe pain Morphine only; other opioids OK    Family History  Problem Relation Age of Onset  . Heart disease Mother   . Emphysema Mother      Prior to Admission medications   Medication Sig Start Date End Date Taking? Authorizing Provider  albuterol (PROVENTIL HFA;VENTOLIN HFA) 108 (90 BASE) MCG/ACT inhaler 2 puffs twice daily for 5 days, then q6h PRN cough/wheeze 06/17/13  Yes Irene Pap, NP  ALPRAZolam (XANAX) 0.5 MG tablet Take 1 tablet (0.5 mg total) by mouth every 6 (six) hours as needed for anxiety. 05/11/13  Yes Neena Rhymes, MD  atenolol (TENORMIN) 100 MG tablet Take 100 mg by mouth daily.   Yes Historical Provider, MD  celecoxib (CELEBREX) 200 MG capsule Take 1 capsule (200 mg total) by mouth daily. 11/21/12  Yes Neena Rhymes, MD  diltiazem [facility]) 180 MG 24 hr capsule Take 180 mg by mouth daily.   Yes Historical Provider, MD  gabapentin (NEURONTIN) 300 MG capsule Take 300-900 mg by mouth 2 (two) times daily. Take 300 mg every morning AS NEEDED.  Take 900 mg every night at bedtime.   Yes Historical Provider, MD  Multiple Vitamin (MULTIVITAMIN WITH MINERALS) TABS tablet Take 1 tablet by mouth daily.   Yes Historical Provider, MD  oxymorphone (OPANA) 10 MG tablet Take 10 mg by mouth every 6 (six) hours as needed for pain. Qam, qhs prn   Yes Historical Provider, MD  PARoxetine (PAXIL) 40 MG tablet Take 40 mg by mouth 2 (two) times daily.   Yes Historical Provider, MD  simvastatin (ZOCOR) 40 MG tablet Take 20 mg by mouth daily.   Yes Historical Provider, MD  SUMAtriptan (IMITREX) 100 MG tablet Take 100 mg by mouth every 2 (two) hours as needed for migraine or headache. May repeat in 2 hours if headache persists or recurs.   Yes Historical Provider, MD  traZODone (DESYREL) 100 MG tablet Take 100 mg by mouth at bedtime as needed for sleep.    Yes Historical Provider, MD    Physical Exam: Filed Vitals:   09/27/13 1100  BP: 135/60  Pulse: 89  Temp:   Resp:     BP 135/60  Pulse 89  Temp(Src) 100.9 F (38.3 C) (Oral)  Resp 16  SpO2 95%  General: Well-developed well-nourished laying on a gurney in no acute cardiopulmonary distress. Speaking in full sentences. Eyes: PERRLA, EOMI, normal lids, irises & conjunctiva ENT: grossly normal hearing, lips & tongue Neck: no LAD, masses or thyromegaly. NO rigidity. Cardiovascular: RRR, no m/r/g. No LE edema. Respiratory: CTA bilaterally, no w/r/r. Normal respiratory effort. Abdomen: soft, ntnd, positive bowel sounds, no rebound, no guarding. Skin: no rash or induration seen on limited exam Musculoskeletal: grossly normal tone BUE/BLE Psychiatric: grossly normal mood and affect, speech fluent and appropriate Neurologic: Alert x3. Cranial nerves II through XII are grossly intact. Visual fields are intact. Sensation is intact. No focal deficits.           Labs on Admission:  Basic Metabolic  Panel:  Recent Labs Lab 09/27/13 0811  NA 145  K 3.8  CL 103  CO2 27  GLUCOSE 147*  BUN 16  CREATININE 0.76  CALCIUM 9.5   Liver Function Tests:  Recent Labs Lab 09/27/13 0811  AST 36  ALT 17  ALKPHOS 94  BILITOT 0.3  PROT 7.3  ALBUMIN 3.8   No results found for this basename: LIPASE, AMYLASE,  in the last 168 hours No results found for this basename: AMMONIA,  in the last 168 hours CBC:  Recent Labs Lab 09/27/13 0811  WBC 5.7  NEUTROABS 4.3  HGB 13.5  HCT 40.4  MCV 83.8  PLT 185   Cardiac Enzymes: No results found for this basename: CKTOTAL, CKMB, CKMBINDEX, TROPONINI,  in the last 168 hours  BNP (last 3 results) No results found for this basename: PROBNP,  in the last 8760 hours CBG: No results found for this basename: GLUCAP,  in the last 168 hours  Radiological Exams on Admission: Ct Head Wo Contrast  09/27/2013   CLINICAL DATA:  Altered mental status and fever  EXAM: CT HEAD  WITHOUT CONTRAST  TECHNIQUE: Contiguous axial images were obtained from the base of the skull through the vertex without intravenous contrast. Study was obtained within 24 hr of patient arrival at the emergency department.  COMPARISON:  None.  FINDINGS: There is age related volume loss. On axial slice 15, there is an area of increased attenuation in the midline subfrontal region which is only seen on this single slice. This area of increased attenuation measures 1.8 by 1.5 cm. There is no appreciable surrounding edema. There is no area suspicious for mass. There is no acute hemorrhage, extra-axial fluid collection, or midline shift. There is patchy small vessel disease in the centra semiovale bilaterally. No acute appearing infarct is appreciable. Bony calvarium appears intact. The mastoid air cells are clear. There is opacification of the left maxillary antrum. There is also mucosal thickening in multiple ethmoid air cells bilaterally.  IMPRESSION: On axial slice 15, there is increased attenuation in the midline subfrontal region. There is nearby bone, although the appearance in this area is not the typical appearance of volume averaging from bone. This appearance is compatible with subfrontal meningioma without surrounding edema. This finding may warrant contrast enhanced CT or MR to further assess with respect to potential lesion in this area.  There is age related volume loss with patchy periventricular small vessel disease. No acute appearing infarct seen. No hemorrhage. There is paranasal sinus disease, primarily involving the left maxillary antrum.   Electronically Signed   By: Lowella Grip M.D.   On: 09/27/2013 07:37   Dg Chest Port 1 View  09/27/2013   CLINICAL DATA:  Altered mental status.  EXAM: PORTABLE CHEST - 1 VIEW  COMPARISON:  06/30/2013  FINDINGS: There is dense consolidation in the right lower lung, new from the prior study, consistent with pneumonia in the proper clinical setting. Milder  patchy airspace opacity is noted in the medial left lung base likely additional infiltrate.  No pleural effusion.  No pneumothorax.  No evidence of edema.  Cardiac silhouette is normal in size.  Normal mediastinal contour.  IMPRESSION: Right greater than left lower lung zone consolidation consistent with multifocal pneumonia.   Electronically Signed   By: Lajean Manes M.D.   On: 09/27/2013 07:58    EKG: None  Assessment/Plan Principal Problem:   Sepsis Active Problems:   Recurrent pneumonia   Encephalopathy acute   UNSPECIFIED  ANEMIA   ANXIETY   MIGRAINE HEADACHE   HYPERTENSION   Reflux   Abnormal CT scan, head   Hypoxia  #1 sepsis Patient presenting with a fever of 104 with a heart rate of 138 and altered mental status and respiratory rate as high as 25. Likely etiology recurrent multifocal pneumonia. Due to patient's altered mental status and high fevers there was concern for possible meningitis. Patient has undergone a lumbar puncture and labs and cultures are pending. Blood cultures are pending. Urinalysis is negative. Lactic acid level is 2.2. Check a pro calcitonin level. Check a sputum Gram stain and culture. Check a urine Legionella antigen. Check urine pneumococcus antigen. Will place patient empirically on IV vancomycin, IV meropenem, IV acyclovir, IV Decadron. Follow.  #2 recurrent multifocal pneumonia This is patient's fourth admission for recurrent pneumonia since October of 2014. Patient is being followed by Dr. Kinnie Feil pulmonary as outpatient. Will check a sputum Gram stain and culture. Check a urine Legionella antigen. Check a urine pneumococcus antigen. Will place patient empirically on IV vancomycin and IV meropenem. Place on oxygen, Mucinex. Will consult with pulmonary for further evaluation and management.  #3 abnormal head CT Concern for possible meningioma. Will get MRI of the head for further evaluation.  #4 gastroesophageal reflux disease PPI.  #5 acute  encephalopathy Likely secondary to problems #1 and 2. Patient has undergone a lumbar puncture and results are pending. CT of the head negative for any hemorrhage or mass. Patient with clinical improvement aspirin questions appropriately and following commands. No neurological deficits. CT of the head had some concerns for possible meningioma. Will get an MRI of the head to followup on abnormal head CT. Patient has been pancultured and results are pending. Check alcohol level. Check a UDS. Will place empirically on IV vancomycin, meropenem, acyclovir, Decadron. Follow.  #6 hypoxia Likely secondary to problem #2. See problem #2.  #7 hypertension Stable. Continue home regimen of atenolol, Cardizem.  #8 migraine headaches Stable. Continue Imitrex as needed.  #9 anxiety Continue anxiolytics as needed and Paxil.  #10 prophylaxis PPI for GI prophylaxis. Lovenox for DVT prophylaxis.  Code Status: Full Family Communication: Updated patient and fiancee at bedside. Disposition Plan: Admit to SDU.  Time spent: [facility] Pager [phone]  **Disclaimer: This note may have been dictated with voice recognition software. Similar sounding words can inadvertently be transcribed and this note may contain transcription errors which may not have been corrected upon publication of note.**

## 2013-09-27 NOTE — ED Notes (Signed)
Pt resting at this time; husband "going to step out for a few, I'll be back"; no needs at this time

## 2013-09-27 NOTE — ED Notes (Signed)
Results of lactic acid given to Dr. Mingo Amber

## 2013-09-27 NOTE — ED Notes (Signed)
EMS - Pt coming from home with Altered mental status.  Last known normal was 10:00pm last night prior to going to bed.

## 2013-09-27 NOTE — ED Notes (Signed)
Floor RN unable to take report at this time due to contact room to call <2 min.

## 2013-09-27 NOTE — ED Notes (Signed)
Admitting Dr. At bedside

## 2013-09-27 NOTE — ED Notes (Signed)
Bianca Haley with Pharmacy sending Acyclovir.

## 2013-09-27 NOTE — Progress Notes (Addendum)
ANTIBIOTIC CONSULT NOTE - INITIAL  Pharmacy Consult for vancomycin,rocephin and acyclovir Indication: meningitis  Allergies  Allergen Reactions  . Penicillins Shortness Of Breath and Rash  . Morphine And Related Swelling    Severe pain Morphine only; other opioids OK    Patient Measurements:    Body Weight: 63kg  Vital Signs: Temp: 104 F (40 C) (05/27 0710) Temp src: Rectal (05/27 0710) BP: 128/62 mmHg (05/27 0900) Pulse Rate: 108 (05/27 0900) Intake/Output from previous day:   Intake/Output from this shift: Total I/O In: 2250 [I.V.:2000; IV Piggyback:250] Out: 1000 [Urine:1000]  Labs:  Recent Labs  09/27/13 0811  WBC 5.7  HGB 13.5  PLT 185  CREATININE 0.76   The CrCl is unknown because both a height and weight (above a minimum accepted value) are required for this calculation. No results found for this basename: VANCOTROUGH, VANCOPEAK, VANCORANDOM, GENTTROUGH, GENTPEAK, GENTRANDOM, TOBRATROUGH, TOBRAPEAK, TOBRARND, AMIKACINPEAK, AMIKACINTROU, AMIKACIN,  in the last 72 hours   Microbiology: No results found for this or any previous visit (from the past 720 hour(s)).  Medical History: Past Medical History  Diagnosis Date  . Hypertension   . Back pain   . Headache(784.0)   . Migraine   . Anemia   . Anxiety   . History of UTI   . History of endometriosis   . History of uterine fibroid   . H/O tinnitus     left    Medications:  See med history Assessment: 63 yo lady with altered mental status to start antibiotics r/o meningitis.  Her CrCl ~ 80-90 ml/min.  CXR shows PNA.  LP was performed in the ED  Goal of Therapy:  Vancomycin trough level 15-20 mcg/ml  Plan:  Vancomycin 1gm IV x 1 in ED then 750 mg IV q8 hours Ceftriaxone 2 gm IV q12 hours Acyclovir 10 mg/kg IV q8 hours F/u renal function, clinical course and cultures. Vanc trough when appropriate  Lora Poteet Seay 09/27/2013,9:23 AM   Addendum:  Changing Rocephin to [place]. Note PCN  allergy  Plan: Merrem 2g IV q8h for meningitis vs PNA Monitor for allergic reaction.   Sloan Leiter, PharmD, BCPS Clinical Pharmacist [phone] 09/27/2013, 3:50 PM

## 2013-09-27 NOTE — ED Notes (Signed)
Spinal fluid (4 tubes) was walked to main lab and handed to lab technician

## 2013-09-27 NOTE — ED Notes (Signed)
Phlebotomy and respiratory at bedside attempting to draw blood.

## 2013-09-27 NOTE — Progress Notes (Signed)
PT Cancellation Note  Patient Details Name: Bianca Haley MRN: [id number] DOB: [birth year]   Cancelled Treatment:    Reason Eval/Treat Not Completed: Patient not medically ready. Discussed pt case with RN who feels PT would be premature this afternoon. States pt just arrived to the floor, is exhausted and arrived with a high temperature. Will continue to follow and evaluate when appropriate.    Jolyn Lent 09/27/2013, 3:19 PM  Jolyn Lent, PT, DPT Acute [facility] Services Pager: [phone]

## 2013-09-27 NOTE — ED Notes (Signed)
Assisted Lolita Lenz, RN with changing pt's gown and stretcher linens due to pt urinating all over herself and stretcher; pt's husband was at bedside

## 2013-09-27 NOTE — ED Provider Notes (Signed)
CSN: [id number]     Arrival date & time 09/27/13  0701 History   First MD Initiated Contact with Patient 09/27/13 0701     Chief Complaint  Patient presents with  . Altered Mental Status     (Consider location/radiation/quality/duration/timing/severity/associated sxs/prior Treatment) Patient is a 63 y.o. female presenting with altered mental status. The history is provided by the EMS personnel and a significant other.  Altered Mental Status Presenting symptoms: behavior changes and unresponsiveness   Severity:  Severe Most recent episode:  Today Episode history:  Single Timing:  Constant Progression:  Worsening Chronicity:  New Context: not drug use, not head injury and not homeless   Associated symptoms: fever   Associated symptoms: no abdominal pain and no vomiting     Past Medical History  Diagnosis Date  . Hypertension   . Back pain   . Headache(784.0)   . Migraine   . Anemia   . Anxiety   . History of UTI   . History of endometriosis   . History of uterine fibroid   . H/O tinnitus     left   Past Surgical History  Procedure Laterality Date  . Abdominal hysterectomy    . Bladder suspension    . Back surgery    . Colonoscopy  02/16/2008    normal  . Laparoscopy      for evauation of endometriosis   Family History  Problem Relation Age of Onset  . Heart disease Mother   . Emphysema Mother    History  Substance Use Topics  . Smoking status: Never Smoker   . Smokeless tobacco: Never Used  . Alcohol Use: Yes     Comment: no drinking for sevral months   OB History   Grav Para Term Preterm Abortions TAB SAB Ect Mult Living   2 2 2   0 0 0 0 0 2     Review of Systems  Unable to perform ROS: Mental status change  Constitutional: Positive for fever.  Gastrointestinal: Negative for vomiting and abdominal pain.      Allergies  Penicillins and Morphine and related  Home Medications   Prior to Admission medications   Medication Sig Start Date End  Date Taking? Authorizing Provider  albuterol (PROVENTIL HFA;VENTOLIN HFA) 108 (90 BASE) MCG/ACT inhaler 2 puffs twice daily for 5 days, then q6h PRN cough/wheeze 06/17/13   Irene Pap, NP  ALPRAZolam (XANAX) 0.5 MG tablet Take 1 tablet (0.5 mg total) by mouth every 6 (six) hours as needed for anxiety. 05/11/13   Neena Rhymes, MD  atenolol (TENORMIN) 100 MG tablet TAKE 1 TABLET (100 MG TOTAL) BY MOUTH DAILY. 02/15/13   Neena Rhymes, MD  celecoxib (CELEBREX) 200 MG capsule Take 1 capsule (200 mg total) by mouth daily. 11/21/12   Neena Rhymes, MD  diltiazem (TIAZAC) 240 MG 24 hr capsule Take 1 capsule (240 mg total) by mouth daily. 02/24/13   Erick Colace, NP  gabapentin (NEURONTIN) 300 MG capsule Take 300-900 mg by mouth 2 (two) times daily. Take 300 mg every morning AS NEEDED.  Take 900 mg every night at bedtime.    Historical Provider, MD  guaifenesin (HUMIBID E) 400 MG TABS tablet Take 1t po q8h for 5 days then q8h PRN chest congestion 06/17/13   Irene Pap, NP  Multiple Vitamin (MULTIVITAMIN WITH MINERALS) TABS tablet Take 1 tablet by mouth daily.    Historical Provider, MD  oxymorphone (OPANA) 10 MG tablet Take 10  mg by mouth every 6 (six) hours as needed for pain. Qam, qhs prn    Historical Provider, MD  pantoprazole (PROTONIX) 20 MG tablet TAKE 2 TABLETS BY MOUTH EVERY MORNING 07/31/13   Neena Rhymes, MD  PARoxetine (PAXIL) 40 MG tablet TAKE 1 TABLET (40 MG TOTAL) BY MOUTH 2 (TWO) TIMES DAILY. 01/23/13   Neena Rhymes, MD  SUMAtriptan (IMITREX) 100 MG tablet TAKE 1 TABLET BY MOUTH DAILY AS NEEDED FOR MIGRAINE 11/29/12   Neena Rhymes, MD  traZODone (DESYREL) 100 MG tablet TAKE 1 TABLET BY MOUTH AT BEDTIME 11/21/12   Neena Rhymes, MD  traZODone (DESYREL) 50 MG tablet Take 1 tablet (50 mg total) by mouth at bedtime as needed for sleep. In adidtion to 100 mg dose = 150 mg 08/28/13   Rowe Clack, MD   BP 173/77  Pulse 138  Temp(Src) 104 F (40 C) (Rectal)  Resp  25  SpO2 94% Physical Exam  Nursing note and vitals reviewed. Constitutional: She appears well-developed and well-nourished. She appears lethargic. She appears distressed.  HENT:  Head: Normocephalic and atraumatic.  Eyes: EOM are normal. Pupils are equal, round, and reactive to light.  Neck: Normal range of motion. Neck supple.  Cardiovascular: Regular rhythm.  Tachycardia present.  Exam reveals no friction rub.   No murmur heard. Pulmonary/Chest: Effort normal and breath sounds normal. No respiratory distress. She has no wheezes. She has no rales.  Abdominal: Soft. She exhibits no distension. There is no tenderness. There is no rebound.  Musculoskeletal: Normal range of motion. She exhibits no edema.  Neurological: She appears lethargic. GCS eye subscore is 4. GCS verbal subscore is 2. GCS motor subscore is 4.  Altered, unable to cooperate with exam.   Skin: Skin is warm. No rash noted. She is not diaphoretic.    ED Course  LUMBAR PUNCTURE Date/Time: 09/27/2013 9:12 AM Performed by: Osvaldo Shipper Authorized by: Osvaldo Shipper Consent: Verbal consent obtained. written consent obtained. Risks and benefits: risks, benefits and alternatives were discussed Consent given by: patient Patient identity confirmed: verbally with patient Time out: Immediately prior to procedure a "time out" was called to verify the correct patient, procedure, equipment, support staff and site/side marked as required. Indications: evaluation for infection Anesthesia: local infiltration Local anesthetic: lidocaine 1% without epinephrine Anesthetic total: 3 ml Patient sedated: no Preparation: Patient was prepped and draped in the usual sterile fashion. Lumbar space: L4-L5 interspace Patient's position: left lateral decubitus Needle gauge: 22 Needle type: spinal needle - Quincke tip Needle length: 3.5 in Number of attempts: 2 Fluid appearance: blood-tinged then clearing Tubes of fluid:  4 Total volume: 6 ml Post-procedure: site cleaned and adhesive bandage applied Patient tolerance: Patient tolerated the procedure well with no immediate complications.   (including critical care time) Labs Review Labs Reviewed  CULTURE, BLOOD (ROUTINE X 2)  CULTURE, BLOOD (ROUTINE X 2)  URINE CULTURE  CSF CULTURE  CBC WITH DIFFERENTIAL  COMPREHENSIVE METABOLIC PANEL  URINALYSIS, ROUTINE W REFLEX MICROSCOPIC  CSF CELL COUNT WITH DIFFERENTIAL  PROTEIN, CSF  GLUCOSE, CSF  I-STAT CG4 LACTIC ACID, ED  I-STAT ARTERIAL BLOOD GAS, ED    Imaging Review Ct Head Wo Contrast  09/27/2013   CLINICAL DATA:  Altered mental status and fever  EXAM: CT HEAD WITHOUT CONTRAST  TECHNIQUE: Contiguous axial images were obtained from the base of the skull through the vertex without intravenous contrast. Study was obtained within 24 hr of patient arrival at the  emergency department.  COMPARISON:  None.  FINDINGS: There is age related volume loss. On axial slice 15, there is an area of increased attenuation in the midline subfrontal region which is only seen on this single slice. This area of increased attenuation measures 1.8 by 1.5 cm. There is no appreciable surrounding edema. There is no area suspicious for mass. There is no acute hemorrhage, extra-axial fluid collection, or midline shift. There is patchy small vessel disease in the centra semiovale bilaterally. No acute appearing infarct is appreciable. Bony calvarium appears intact. The mastoid air cells are clear. There is opacification of the left maxillary antrum. There is also mucosal thickening in multiple ethmoid air cells bilaterally.  IMPRESSION: On axial slice 15, there is increased attenuation in the midline subfrontal region. There is nearby bone, although the appearance in this area is not the typical appearance of volume averaging from bone. This appearance is compatible with subfrontal meningioma without surrounding edema. This finding may warrant  contrast enhanced CT or MR to further assess with respect to potential lesion in this area.  There is age related volume loss with patchy periventricular small vessel disease. No acute appearing infarct seen. No hemorrhage. There is paranasal sinus disease, primarily involving the left maxillary antrum.   Electronically Signed   By: Lowella Grip M.D.   On: 09/27/2013 07:37   Dg Chest Port 1 View  09/27/2013   CLINICAL DATA:  Altered mental status.  EXAM: PORTABLE CHEST - 1 VIEW  COMPARISON:  06/30/2013  FINDINGS: There is dense consolidation in the right lower lung, new from the prior study, consistent with pneumonia in the proper clinical setting. Milder patchy airspace opacity is noted in the medial left lung base likely additional infiltrate.  No pleural effusion.  No pneumothorax.  No evidence of edema.  Cardiac silhouette is normal in size.  Normal mediastinal contour.  IMPRESSION: Right greater than left lower lung zone consolidation consistent with multifocal pneumonia.   Electronically Signed   By: Lajean Manes M.D.   On: 09/27/2013 07:58     EKG Interpretation None      CRITICAL CARE Performed by: Osvaldo Shipper   Total critical care time: 30 minutes  Critical care time was exclusive of separately billable procedures and treating other patients.  Critical care was necessary to treat or prevent imminent or life-threatening deterioration.  Critical care was time spent personally by me on the following activities: development of treatment plan with patient and/or surrogate as well as nursing, discussions with consultants, evaluation of patient's response to treatment, examination of patient, obtaining history from patient or surrogate, ordering and performing treatments and interventions, ordering and review of laboratory studies, ordering and review of radiographic studies, pulse oximetry and re-evaluation of patient's condition.  MDM   Final diagnoses:  Pneumonia   Altered mental status  Fever    46F presents with altered mental status. Last known normal when she went to bed last night, awoke at 3 am not acting like herself. With EMS, initially hypoxic in the 80s, improved on 4 L. Patient's GCS is roughly 10. Boyfriend is at bedside, states rigors through the night, last seen normal around 11 pm. On exam, febrile rectally to 104, tachycardic in the 140s. Patient altered with GCS around 9-10. Hypoxic on room air, corrects to the 90s with 4 L [place]. Concern for possible meningitis with her altered mental status. Will CT her head as she's 63 and obtain sepsis workup. CT Head normal. CXR shows multifocal  pneumonia, likely the source of her fever. Due to the high degree of altered mental status, will still pursue the LP. At 825 I reassessed the patient. She is alert and oriented. Stating she is feeling poorly, but talkative and knows the situation. She is agreeable to LP. LP done, bloody then clearing immediately.  Dr. Grandville Silos admitting to [facility]   Osvaldo Shipper, MD 09/27/13 [phone]

## 2013-09-27 NOTE — ED Notes (Signed)
Report given to [place], NT

## 2013-09-27 NOTE — ED Notes (Signed)
Floor RN not available for report. 

## 2013-09-27 NOTE — ED Notes (Addendum)
Dr. Mingo Amber at bedside for Lumbar puncture.  Consent obtained from pt.  Timeout initiated.

## 2013-09-27 NOTE — Consult Note (Signed)
Name: Bianca Haley MRN: [id number] DOB: 1951/04/05    ADMISSION DATE:  09/27/2013 CONSULTATION DATE:  09/27/2013  REFERRING MD :  Grandville Silos PRIMARY SERVICE:  [facility]  CHIEF COMPLAINT:  Recurrent pneumonia    HISTORY OF PRESENT ILLNESS:  55 never smoker, known to me, admitted for multifocal pneumonia & altered mental status. Presented with altered mental status & fever 104, hypoxic to EMS, GCS 10 on arrival, rigors at night per boyfriend, head Ct neg, CSF bloody, cleared on tube 4, no WCs, MRI nml, left maxillary sinus opacified, CXR -Right greater than left lower lung zone consolidation consistent with multifocal pneumonia History obtained after reviewing ER/adm record. She is sleepy & denies headache, cough, satn 98% on RA   She was first seen by me for chronic cough x 67mnths on 02/21/13  She was diagnosed with RLL community acquired pneumonia on 01/04/13 & treated with levaquin with clearing of infiltrate on 01/26/13.  Admitted 02/21/13 for recurrent RLL PNA , hypotension. CT chest showed RML/RLL pneumonia and what appeared to be some progression of bronchiectasis.  CT chest 10/2009 >> Scattered areas of scar and bronchiectasis in both lungs,adjacent calcified granulomata in the right lower lobe.  Esophagram was negative. IgG,IgaA and IgM levels were low, but not to the extent we felt she was immunosuppressed.  CXR 03/23/13 clearing of infiltrates  06/2013 - she went to PCP and was diagnosed with pneumonia - placed her on doxycycline and mucinex.  CXR 06/30/13 clear - denied further abx  Takes opana once in am  Neurontin daily, xanax helped with anxiety  Needs protonix daily- dietary avoidance & changes have been made for GERD  SO was hospitalised after back surgery & ensuing complications, now they live separately   PAST MEDICAL HISTORY :  Past Medical History  Diagnosis Date  . Hypertension   . Back pain   . Headache(784.0)   . Migraine   . Anemia   . Anxiety   . History of  UTI   . History of endometriosis   . History of uterine fibroid   . H/O tinnitus     left  . GERD (gastroesophageal reflux disease)    Past Surgical History  Procedure Laterality Date  . Abdominal hysterectomy    . Bladder suspension    . Back surgery    . Colonoscopy  02/16/2008    normal  . Laparoscopy      for evauation of endometriosis   Prior to Admission medications   Medication Sig Start Date End Date Taking? Authorizing Provider  albuterol (PROVENTIL HFA;VENTOLIN HFA) 108 (90 BASE) MCG/ACT inhaler 2 puffs twice daily for 5 days, then q6h PRN cough/wheeze 06/17/13  Yes Irene Pap, NP  ALPRAZolam (XANAX) 0.5 MG tablet Take 1 tablet (0.5 mg total) by mouth every 6 (six) hours as needed for anxiety. 05/11/13  Yes Neena Rhymes, MD  atenolol (TENORMIN) 100 MG tablet Take 100 mg by mouth daily.   Yes Historical Provider, MD  celecoxib (CELEBREX) 200 MG capsule Take 1 capsule (200 mg total) by mouth daily. 11/21/12  Yes Neena Rhymes, MD  diltiazem [facility]) 180 MG 24 hr capsule Take 180 mg by mouth daily.   Yes Historical Provider, MD  gabapentin (NEURONTIN) 300 MG capsule Take 300-900 mg by mouth 2 (two) times daily. Take 300 mg every morning AS NEEDED.  Take 900 mg every night at bedtime.   Yes Historical Provider, MD  Multiple Vitamin (MULTIVITAMIN WITH MINERALS) TABS tablet Take 1  tablet by mouth daily.   Yes Historical Provider, MD  oxymorphone (OPANA) 10 MG tablet Take 10 mg by mouth every 6 (six) hours as needed for pain. Qam, qhs prn   Yes Historical Provider, MD  PARoxetine (PAXIL) 40 MG tablet Take 40 mg by mouth 2 (two) times daily.   Yes Historical Provider, MD  simvastatin (ZOCOR) 40 MG tablet Take 20 mg by mouth daily.   Yes Historical Provider, MD  SUMAtriptan (IMITREX) 100 MG tablet Take 100 mg by mouth every 2 (two) hours as needed for migraine or headache. May repeat in 2 hours if headache persists or recurs.   Yes Historical Provider, MD  traZODone (DESYREL)  100 MG tablet Take 100 mg by mouth at bedtime as needed for sleep.    Yes Historical Provider, MD   Allergies  Allergen Reactions  . Penicillins Shortness Of Breath and Rash  . Morphine And Related Swelling    Severe pain Morphine only; other opioids OK    FAMILY HISTORY:  Family History  Problem Relation Age of Onset  . Heart disease Mother   . Emphysema Mother    SOCIAL HISTORY:  reports that she has never smoked. She has never used smokeless tobacco. She reports that she drinks alcohol. She reports that she does not use illicit drugs.  REVIEW OF SYSTEMS:  POS as in HPI Constitutional: Negative for weight loss, malaise/fatigue and diaphoresis.  HENT: Negative for hearing loss, ear pain, nosebleeds, congestion, sore throat, neck pain, tinnitus and ear discharge.   Eyes: Negative for blurred vision, double vision, photophobia, pain, discharge and redness.  Respiratory: Negative for cough, hemoptysis, sputum production, shortness of breath, wheezing and stridor.   Cardiovascular: Negative for chest pain, palpitations, orthopnea, claudication, leg swelling and PND.  Gastrointestinal: Negative for heartburn, nausea, vomiting, abdominal pain, diarrhea, constipation, blood in stool and melena.  Genitourinary: Negative for dysuria, urgency, frequency, hematuria and flank pain.  Musculoskeletal: Negative for myalgias, back pain, joint pain and falls.  Skin: Negative for itching and rash.  Neurological: Negative for dizziness, tingling, tremors, sensory change, speech change, focal weakness, seizures,  weakness and headaches.  Endo/Heme/Allergies: Negative for environmental allergies and polydipsia. Does not bruise/bleed easily.  SUBJECTIVE:   VITAL SIGNS: Temp:  [98.1 F (36.7 C)-104 F (40 C)] 98.1 F (36.7 C) (05/27 1450) Pulse Rate:  [81-138] 81 (05/27 1450) Resp:  [16-25] 20 (05/27 1450) BP: (124-173)/(49-90) 160/63 mmHg (05/27 1450) SpO2:  [91 %-97 %] 93 % (05/27  1450) Weight:  [64.1 kg (141 lb 5 oz)] 64.1 kg (141 lb 5 oz) (05/27 1450)  PHYSICAL EXAMINATION: Gen. Pleasant, well-nourished, in no distress, sleepy, anxious affect ENT - no lesions, no post nasal drip Neck: No JVD, no thyromegaly, no carotid bruits Lungs: no use of accessory muscles, no dullness to percussion, decreased rt base without rales or rhonchi  Cardiovascular: Rhythm regular, heart sounds  normal, no murmurs, no peripheral edema Abdomen: soft and non-tender, no hepatosplenomegaly, BS normal. Musculoskeletal: No deformities, no cyanosis or clubbing Neuro:  alert, non focal Skin:  Warm, no lesions/ rash    Recent Labs Lab 09/27/13 0811  NA 145  K 3.8  CL 103  CO2 27  BUN 16  CREATININE 0.76  GLUCOSE 147*    Recent Labs Lab 09/27/13 0811  HGB 13.5  HCT 40.4  WBC 5.7  PLT 185   Ct Head Wo Contrast  09/27/2013   CLINICAL DATA:  Altered mental status and fever  EXAM: CT HEAD WITHOUT  CONTRAST  TECHNIQUE: Contiguous axial images were obtained from the base of the skull through the vertex without intravenous contrast. Study was obtained within 24 hr of patient arrival at the emergency department.  COMPARISON:  None.  FINDINGS: There is age related volume loss. On axial slice 15, there is an area of increased attenuation in the midline subfrontal region which is only seen on this single slice. This area of increased attenuation measures 1.8 by 1.5 cm. There is no appreciable surrounding edema. There is no area suspicious for mass. There is no acute hemorrhage, extra-axial fluid collection, or midline shift. There is patchy small vessel disease in the centra semiovale bilaterally. No acute appearing infarct is appreciable. Bony calvarium appears intact. The mastoid air cells are clear. There is opacification of the left maxillary antrum. There is also mucosal thickening in multiple ethmoid air cells bilaterally.  IMPRESSION: On axial slice 15, there is increased attenuation in  the midline subfrontal region. There is nearby bone, although the appearance in this area is not the typical appearance of volume averaging from bone. This appearance is compatible with subfrontal meningioma without surrounding edema. This finding may warrant contrast enhanced CT or MR to further assess with respect to potential lesion in this area.  There is age related volume loss with patchy periventricular small vessel disease. No acute appearing infarct seen. No hemorrhage. There is paranasal sinus disease, primarily involving the left maxillary antrum.   Electronically Signed   By: Lowella Grip M.D.   On: 09/27/2013 07:37   Mr Jeri Cos JY Contrast  09/27/2013   CLINICAL DATA:  Headache and fever.  Abnormal CT scan.  EXAM: MRI HEAD WITHOUT AND WITH CONTRAST  TECHNIQUE: Multiplanar, multiecho pulse sequences of the brain and surrounding structures were obtained without and with intravenous contrast.  CONTRAST:  63mL MULTIHANCE GADOBENATE DIMEGLUMINE 529 MG/ML IV SOLN  COMPARISON:  CT head from the same day.  FINDINGS: Dilated perivascular spaces are present within the basal ganglia. Mild atrophy and minimal white matter changes are within normal limits for age.  Flow is present in the major intracranial arteries.  The left maxillary sinus is opacified. Minimal fluid levels are present scratch the small fluid levels are present within scattered ethmoid air cells. There is a small fluid level in the right sphenoid sinus. The mastoid air cells are clear.  The postcontrast images demonstrate no pathologic enhancement. No focal lesion is present along the planum sphenoidale. The abnormality at CT was likely partial voluming.  IMPRESSION: 1. No focal lesion along the planum sphenoidale. The abnormality on CT is likely partial voluming. 2. Normal MRI appearance of the brain for age. 3. Mild diffuse sinus disease as described. The left maxillary sinus is completely opacified.   Electronically Signed   By: Lawrence Santiago M.D.   On: 09/27/2013 13:41   Dg Chest Port 1 View  09/27/2013   CLINICAL DATA:  Altered mental status.  EXAM: PORTABLE CHEST - 1 VIEW  COMPARISON:  06/30/2013  FINDINGS: There is dense consolidation in the right lower lung, new from the prior study, consistent with pneumonia in the proper clinical setting. Milder patchy airspace opacity is noted in the medial left lung base likely additional infiltrate.  No pleural effusion.  No pneumothorax.  No evidence of edema.  Cardiac silhouette is normal in size.  Normal mediastinal contour.  IMPRESSION: Right greater than left lower lung zone consolidation consistent with multifocal pneumonia.   Electronically Signed   By: Shanon Brow  Ormond M.D.   On: 09/27/2013 07:58    ASSESSMENT / PLAN:  Recurrent CAP - I favor aspiration as the most likely cause. She is not immunocompromised. Infiltrates have resolved each time , so I do not suspect underlying malignancy. She does have left maxillary sinusistis but I doubt this is contributing. GERd is reportedly controlled. She does not have esophageal issues. I suspect aspiration may be related to her pain meds. This would be a more difficult conversation - I have tried to have this conversation with her before. She is very sleepy today for me to reiterate this.  Altered mental status - she did not receive narcan -this would have been diagnostic. I do not think she has meningitis.  Will follow,  Kara Mead MD. Shade Flood. [facility] Pager [phone] If no response call [phone]    09/27/2013, 3:06 PM

## 2013-09-28 DIAGNOSIS — F411 Generalized anxiety disorder: Secondary | ICD-10-CM

## 2013-09-28 DIAGNOSIS — E876 Hypokalemia: Secondary | ICD-10-CM | POA: Diagnosis present

## 2013-09-28 DIAGNOSIS — M542 Cervicalgia: Secondary | ICD-10-CM | POA: Diagnosis present

## 2013-09-28 DIAGNOSIS — I10 Essential (primary) hypertension: Secondary | ICD-10-CM

## 2013-09-28 DIAGNOSIS — R4182 Altered mental status, unspecified: Secondary | ICD-10-CM

## 2013-09-28 DIAGNOSIS — M549 Dorsalgia, unspecified: Secondary | ICD-10-CM

## 2013-09-28 DIAGNOSIS — R930 Abnormal findings on diagnostic imaging of skull and head, not elsewhere classified: Secondary | ICD-10-CM

## 2013-09-28 DIAGNOSIS — G8929 Other chronic pain: Secondary | ICD-10-CM

## 2013-09-28 LAB — CBC WITH DIFFERENTIAL/PLATELET
BASOS ABS: 0 10*3/uL (ref 0.0–0.1)
Basophils Relative: 0 % (ref 0–1)
EOS ABS: 0 10*3/uL (ref 0.0–0.7)
Eosinophils Relative: 0 % (ref 0–5)
HCT: 34.3 % — ABNORMAL LOW (ref 36.0–46.0)
Hemoglobin: 12.2 g/dL (ref 12.0–15.0)
Lymphocytes Relative: 6 % — ABNORMAL LOW (ref 12–46)
Lymphs Abs: 0.9 10*3/uL (ref 0.7–4.0)
MCH: 28.7 pg (ref 26.0–34.0)
MCHC: 35.6 g/dL (ref 30.0–36.0)
MCV: 80.7 fL (ref 78.0–100.0)
MONOS PCT: 2 % — AB (ref 3–12)
Monocytes Absolute: 0.3 10*3/uL (ref 0.1–1.0)
NEUTROS PCT: 92 % — AB (ref 43–77)
Neutro Abs: 14.1 10*3/uL — ABNORMAL HIGH (ref 1.7–7.7)
Platelets: 231 10*3/uL (ref 150–400)
RBC: 4.25 MIL/uL (ref 3.87–5.11)
RDW: 12.7 % (ref 11.5–15.5)
WBC: 15.3 10*3/uL — ABNORMAL HIGH (ref 4.0–10.5)

## 2013-09-28 LAB — COMPREHENSIVE METABOLIC PANEL
ALT: 15 U/L (ref 0–35)
AST: 26 U/L (ref 0–37)
Albumin: 3.3 g/dL — ABNORMAL LOW (ref 3.5–5.2)
Alkaline Phosphatase: 88 U/L (ref 39–117)
BUN: 7 mg/dL (ref 6–23)
CO2: 29 meq/L (ref 19–32)
Calcium: 9.5 mg/dL (ref 8.4–10.5)
Chloride: 100 mEq/L (ref 96–112)
Creatinine, Ser: 0.53 mg/dL (ref 0.50–1.10)
GFR calc non Af Amer: >90 mL/min (ref >90)
Glucose, Bld: 151 mg/dL — ABNORMAL HIGH (ref 70–99)
POTASSIUM: 2.9 meq/L — AB (ref 3.7–5.3)
SODIUM: 142 meq/L (ref 137–147)
Total Bilirubin: 0.5 mg/dL (ref 0.3–1.2)
Total Protein: 6.9 g/dL (ref 6.0–8.3)

## 2013-09-28 LAB — URINE CULTURE
CULTURE: NO GROWTH
Colony Count: NO GROWTH

## 2013-09-28 LAB — PATHOLOGIST SMEAR REVIEW

## 2013-09-28 LAB — LEGIONELLA ANTIGEN, URINE: LEGIONELLA ANTIGEN, URINE: NEGATIVE

## 2013-09-28 LAB — HIV ANTIBODY (ROUTINE TESTING W REFLEX): HIV 1&2 Ab, 4th Generation: NONREACTIVE

## 2013-09-28 LAB — POTASSIUM: Potassium: 3.5 mEq/L — ABNORMAL LOW (ref 3.7–5.3)

## 2013-09-28 MED ORDER — MORPHINE SULFATE ER 15 MG PO TBCR
15.0000 mg | EXTENDED_RELEASE_TABLET | Freq: Two times a day (BID) | ORAL | Status: DC
  Administered 2013-09-28 – 2013-09-29 (×3): 15 mg via ORAL
  Filled 2013-09-28 (×3): qty 1

## 2013-09-28 MED ORDER — DEXAMETHASONE SODIUM PHOSPHATE 10 MG/ML IJ SOLN
10.0000 mg | Freq: Two times a day (BID) | INTRAMUSCULAR | Status: DC
  Administered 2013-09-28 – 2013-09-29 (×2): 10 mg via INTRAVENOUS
  Filled 2013-09-28 (×3): qty 1

## 2013-09-28 MED ORDER — POTASSIUM CHLORIDE 20 MEQ/15ML (10%) PO LIQD
40.0000 meq | Freq: Once | ORAL | Status: AC
  Administered 2013-09-28: 40 meq via ORAL
  Filled 2013-09-28 (×2): qty 30

## 2013-09-28 MED ORDER — POTASSIUM CHLORIDE CRYS ER 20 MEQ PO TBCR: 40.0000 meq | EXTENDED_RELEASE_TABLET | Freq: Once | ORAL | Status: DC

## 2013-09-28 MED ORDER — ALPRAZOLAM 0.25 MG PO TABS: 0.2500 mg | ORAL_TABLET | Freq: Three times a day (TID) | ORAL | Status: DC | PRN

## 2013-09-28 MED ORDER — POTASSIUM CHLORIDE 20 MEQ/15ML (10%) PO LIQD
40.0000 meq | Freq: Once | ORAL | Status: AC
  Administered 2013-09-28: 40 meq via ORAL
  Filled 2013-09-28: qty 30

## 2013-09-28 NOTE — Progress Notes (Signed)
Utilization Review Completed.  

## 2013-09-28 NOTE — Progress Notes (Signed)
[facility]  Bianca Haley MCE:[id number] DOB: Jan 03, 1951 DOA: 09/27/2013 PCP: Sonda Primes, MD  Time spent :  Brief narrative: 63 y.o. Female w/ history of hypertension, migraine headaches, endometriosis, recurrent pneumonias with 4 episodes since October of 2014. Presented to the ED with altered mental status. Per her fiancee patient was noted to have some subjective fevers, chills, right worse, wheezing around 3 AM on the morning of admission. Patient subsequently went to sleep patient then woke up again around 5 AM with rigors and incoherent speech. Her fianc called 911 and patient was brought to the emergency room. Patient did endorse a cough over the past 1-1/2 days with some subjective fevers and chills. Patient denied any shortness of breath. Patient did endorse some photophobia, some nausea and some generalized weakness.   On arrival to the ED patient was noted to have a temperature of 104 with a heart rate of 138. Per ED physician patient more alert and following commands.  CT of the head which was obtained was negative for any hemorrhage or mass or midline shift or extra axial fluid collection however was concerning for possible subfrontal meningioma. Chest x-ray which was done showed right greater than left lower lung zone consolidation consistent with multifocal pneumonia. ED venous blood gas: pH of 7.42 PCO2 of 46 PO2 of 26 bicarbonate of 29 and comprehensive metabolic profile obtained was unremarkable. Lactic acid level was 2.02. CBC obtained was unremarkable. Lumbar puncture was done per ED physician and patient placed empirically on IV vancomycin, Rocephin, acyclovir and Decadron.  HPI/Subjective: No cough or chills reported. Still with her usual neck and back pain especially with flexion and extension action.  Assessment/Plan: Active Problems:   Recurrent pneumonia/Hypoxia -Appreciate pulmonary medicine input -Agree that pulmonary  symptoms seem to be mediated by excessive sedation related to patient's multiple pain and antianxiety medications causing an aspiration pneumonitis -Hypoxia has resolved and likely related to altered mentation with associated low lung volumes -Narrow current antibiotics-Procalcitonin less than 0.10 so suspect we'll only need 72 hours of antibiotics    Encephalopathy acute -Has resolved and seems related to pre-admission medications -LP unremarkable and not consistent with meningitis so likely can narrow antibiotics and taper Decadron noting CSF culture without any organisms and no growth thus far      Chronic neck and back pain -Patient endorses follows with pain clinic who has been prescribing her benzodiazepines and opiates as well as her gabapentin -Had back surgery 1-1/2 years ago and now endorses has 2 new areas in the back and neck which likely will require surgery (Dr. Jeral Fruit) but patient has been using medical therapy until as she puts it " I absolutely have to have surgery". -We have decrease Xanax from 0.5 to 0.25 mg this admission -Opana not formulary and note patient on equivalent dose of MS Contin that would equal 30 mg every 6 hours when necessary-instead I have ordered MS Contin 15 mg every 12 hours scheduled -Pulmonologist (Dr. Vassie Loll) has discussed concerns that recurrent aspiration related to her pain medications with the patient and he plans on discussing with her pain doctors as well    HYPERTENSION -Controlled    Reflux -Seems controlled with antireflux medication -Had unremarkable esophagram October 2014 -Given ongoing problems with cervical neck discomfort and recurrent aspiration have asked speech therapy to perform a minimum of bedside evaluation    Abnormal CT scan, head -Initial CT obtained in the ER was concerning for possible  subfrontal meningioma without surrounding edema. -MRI was obtained to clarify and was normal without the above findings noted    Hypokalemia -Oral replete    UNSPECIFIED ANEMIA    ANXIETY   DVT prophylaxis: Lovenox Code Status: Full Family Communication: No family at bedside Disposition Plan/Expected LOS: Step down   Consultants: Pulmonary  Procedures: Lumbar puncture in the emergency department  Antibiotics: Acyclovir 5/27 >>> 5/28 Ceftriaxone 5/27 >>> 5/28 Meropenem 5/27 >>> Vancomycin 5/27 >>> 5/28  Objective: Blood pressure 137/78, pulse 73, temperature 98.2 F (36.8 C), temperature source Oral, resp. rate 28, height 5\' 5"  (1.651 m), weight 139 lb 15.9 oz (63.5 kg), SpO2 94.00%.  Intake/Output Summary (Last 24 hours) at 09/28/13 1212 Last data filed at 09/28/13 0700  Gross per 24 hour  Intake 2533.85 ml  Output   4875 ml  Net -2341.15 ml     Exam: General: No acute respiratory distress Lungs: Clear to auscultation bilaterally without wheezes or crackles, RA Cardiovascular: Regular rate and rhythm without murmur gallop or rub normal S1 and S2, no peripheral edema or JVD Abdomen: Nontender, nondistended, soft, bowel sounds positive, no rebound, no ascites, no appreciable mass Musculoskeletal: No significant cyanosis, clubbing of bilateral lower extremities-noted paracervical spasms reproducible upon exam Neurological: Awake and oriented x 3, moves all extremities x 4 without focal neurological deficits, CN 2-12 intact  Scheduled Meds:  Scheduled Meds: . acyclovir  10 mg/kg (Order-Specific) Intravenous Q8H  . atenolol  100 mg Oral Daily  . celecoxib  200 mg Oral Daily  . dexamethasone  10 mg Intravenous 4 times per day  . diltiazem  180 mg Oral Daily  . enoxaparin (LOVENOX) injection  40 mg Subcutaneous Q24H  . gabapentin  900 mg Oral QHS  . meropenem (MERREM) IV  2 g Intravenous 3 times per day  . multivitamin with minerals  1 tablet Oral Daily  . pantoprazole  40 mg Oral Q0600  . PARoxetine  40 mg Oral BID  . simvastatin  20 mg Oral q1800  . vancomycin  750 mg Intravenous Q8H    Continuous Infusions: . sodium chloride 1,000 mL (09/28/13 1037)    Data Reviewed: Basic Metabolic Panel:  Recent Labs Lab 09/27/13 0811 09/27/13 1548 09/28/13 0249 09/28/13 0909  NA 145  --  142  --   K 3.8  --  2.9* 3.5*  CL 103  --  100  --   CO2 27  --  29  --   GLUCOSE 147*  --  151*  --   BUN 16  --  7  --   CREATININE 0.76 0.62 0.53  --   CALCIUM 9.5  --  9.5  --   MG  --  1.5  --   --    Liver Function Tests:  Recent Labs Lab 09/27/13 0811 09/28/13 0249  AST 36 26  ALT 17 15  ALKPHOS 94 88  BILITOT 0.3 0.5  PROT 7.3 6.9  ALBUMIN 3.8 3.3*   No results found for this basename: LIPASE, AMYLASE,  in the last 168 hours No results found for this basename: AMMONIA,  in the last 168 hours CBC:  Recent Labs Lab 09/27/13 0811 09/27/13 1548 09/28/13 0249  WBC 5.7 13.9* 15.3*  NEUTROABS 4.3  --  14.1*  HGB 13.5 13.0 12.2  HCT 40.4 37.2 34.3*  MCV 83.8 81.8 80.7  PLT 185 191 231   Cardiac Enzymes: No results found for this basename: CKTOTAL, CKMB, CKMBINDEX, TROPONINI,  in the  last 168 hours BNP (last 3 results) No results found for this basename: PROBNP,  in the last 8760 hours CBG: No results found for this basename: GLUCAP,  in the last 168 hours  Recent Results (from the past 240 hour(s))  CULTURE, BLOOD (ROUTINE X 2)     Status: None   Collection Time    09/27/13  7:50 AM      Result Value Ref Range Status   Specimen Description BLOOD RIGHT ANTECUBITAL   Final   Special Requests BOTTLES DRAWN AEROBIC AND ANAEROBIC R 5CC B 10CC   Final   Culture  Setup Time     Final   Value: 09/27/2013 12:39     Performed at [facility]   Culture     Final   Value:        BLOOD CULTURE RECEIVED NO GROWTH TO DATE CULTURE WILL BE HELD FOR 5 DAYS BEFORE ISSUING A FINAL NEGATIVE REPORT     Performed at [facility]   Report Status PENDING   Incomplete  CULTURE, BLOOD (ROUTINE X 2)     Status: None   Collection Time    09/27/13  8:00 AM       Result Value Ref Range Status   Specimen Description BLOOD RIGHT HAND   Final   Special Requests BOTTLES DRAWN AEROBIC ONLY 10CC   Final   Culture  Setup Time     Final   Value: 09/27/2013 12:38     Performed at [facility]   Culture     Final   Value:        BLOOD CULTURE RECEIVED NO GROWTH TO DATE CULTURE WILL BE HELD FOR 5 DAYS BEFORE ISSUING A FINAL NEGATIVE REPORT     Performed at [facility]   Report Status PENDING   Incomplete  CSF CULTURE     Status: None   Collection Time    09/27/13  9:13 AM      Result Value Ref Range Status   Specimen Description CSF   Final   Special Requests 2.0   Final   Gram Stain     Final   Value: CYTOSPIN NO WBC SEEN     NO ORGANISMS SEEN     Performed at [facility]     Performed at [facility]   Culture     Final   Value: NO GROWTH 1 DAY     Performed at [facility]   Report Status PENDING   Incomplete  GRAM STAIN     Status: None   Collection Time    09/27/13  9:13 AM      Result Value Ref Range Status   Specimen Description CSF   Final   Special Requests 2.0CSF FLUID   Final   Gram Stain     Final   Value: CYTOSPIN SLIDE     NO WBC SEEN     NO ORGANISMS SEEN   Report Status 09/27/2013 FINAL   Final  MRSA PCR SCREENING     Status: None   Collection Time    09/27/13  2:48 PM      Result Value Ref Range Status   MRSA by PCR NEGATIVE  NEGATIVE Final   Comment:            The GeneXpert MRSA Assay (FDA     approved for NASAL specimens     only), is one component of a  comprehensive MRSA colonization     surveillance program. It is not     intended to diagnose MRSA     infection nor to guide or     monitor treatment for     MRSA infections.     Studies:  Recent x-ray studies have been reviewed in detail by the Attending Physician       Erin Hearing, ANP [facility] Office  [phone] Pager [phone]   **If unable to reach the above provider after paging  please contact the [place] @ [phone]  On-Call/Text Page:      [email or web address]      password [email or web address]  If 7PM-7AM, please contact night-coverage [email or web address] Password [email or web address] 09/28/2013, 12:12 PM   LOS: 1 day   Examined patient and reviewed assessment and plan with ANP Ebony Hail, and agree with the above plan.  The patient has multiple organ systems involvement and requires high complexity decision making for assessment and support, frequent evaluation and titration of therapies, application of advanced monitoring technologies and extensive interpretation of multiple databases.  Time devoted to patient care services described in this note is 35 minutes.

## 2013-09-28 NOTE — Progress Notes (Signed)
Pt's potassium level was 2.9, on-call for [facility] notified, orders given.--------Alichia Alridge, rn

## 2013-09-28 NOTE — Progress Notes (Signed)
Name: Bianca Haley MRN: [id number] DOB: 1950/05/10    ADMISSION DATE:  09/27/2013 CONSULTATION DATE:  09/28/2013  REFERRING MD :  Grandville Silos PRIMARY SERVICE:  [facility]  CHIEF COMPLAINT:  Recurrent pneumonia    HISTORY OF PRESENT ILLNESS:  77 never smoker, known to me, admitted for multifocal pneumonia & altered mental status. Presented with altered mental status & fever 104, hypoxic to EMS, GCS 10 on arrival, rigors at night per boyfriend, head Ct neg, CSF bloody, cleared on tube 4, no WCs, MRI nml, left maxillary sinus opacified, CXR -Right greater than left lower lung zone consolidation consistent with multifocal pneumonia History obtained after reviewing ER/adm record. She is sleepy & denies headache, cough, satn 98% on RA   She was first seen by me for chronic cough x 58mnths on 02/21/13  She was diagnosed with RLL community acquired pneumonia on 01/04/13 & treated with levaquin with clearing of infiltrate on 01/26/13.  Admitted 02/21/13 for recurrent RLL PNA , hypotension. CT chest showed RML/RLL pneumonia and what appeared to be some progression of bronchiectasis.  CT chest 10/2009 >> Scattered areas of scar and bronchiectasis in both lungs,adjacent calcified granulomata in the right lower lobe.  Esophagram was negative. IgG,IgaA and IgM levels were low, but not to the extent we felt she was immunosuppressed.  CXR 03/23/13 clearing of infiltrates  06/2013 - she went to PCP and was diagnosed with pneumonia - placed her on doxycycline and mucinex.  CXR 06/30/13 clear - denied further abx  Takes opana once in am  Neurontin daily, xanax helped with anxiety  Needs protonix daily- dietary avoidance & changes have been made for GERD   SUBJECTIVE: Afebrile Denies pain, dyspnea   VITAL SIGNS: Temp:  [98 F (36.7 C)-98.2 F (36.8 C)] 98.2 F (36.8 C) (05/28 0753) Pulse Rate:  [72-85] 73 (05/28 0753) Resp:  [17-28] 28 (05/28 0753) BP: (126-160)/(54-78) 137/78 mmHg (05/28 0753) SpO2:   [91 %-96 %] 94 % (05/28 0753) Weight:  [63.5 kg (139 lb 15.9 oz)-64.1 kg (141 lb 5 oz)] 63.5 kg (139 lb 15.9 oz) (05/28 0424)  PHYSICAL EXAMINATION: Gen. Pleasant, well-nourished, in no distress, sleepy, anxious affect ENT - no lesions, no post nasal drip Neck: No JVD, no thyromegaly, no carotid bruits Lungs: no use of accessory muscles, no dullness to percussion, decreased rt base without rales or rhonchi  Cardiovascular: Rhythm regular, heart sounds  normal, no murmurs, no peripheral edema Abdomen: soft and non-tender, no hepatosplenomegaly, BS normal. Musculoskeletal: No deformities, no cyanosis or clubbing Neuro:  alert, non focal Skin:  Warm, no lesions/ rash    Recent Labs Lab 09/27/13 0811 09/27/13 1548 09/28/13 0249  NA 145  --  142  K 3.8  --  2.9*  CL 103  --  100  CO2 27  --  29  BUN 16  --  7  CREATININE 0.76 0.62 0.53  GLUCOSE 147*  --  151*    Recent Labs Lab 09/27/13 0811 09/27/13 1548 09/28/13 0249  HGB 13.5 13.0 12.2  HCT 40.4 37.2 34.3*  WBC 5.7 13.9* 15.3*  PLT 185 191 231   Ct Head Wo Contrast  09/27/2013   CLINICAL DATA:  Altered mental status and fever  EXAM: CT HEAD WITHOUT CONTRAST  TECHNIQUE: Contiguous axial images were obtained from the base of the skull through the vertex without intravenous contrast. Study was obtained within 24 hr of patient arrival at the emergency department.  COMPARISON:  None.  FINDINGS: There is age  related volume loss. On axial slice 15, there is an area of increased attenuation in the midline subfrontal region which is only seen on this single slice. This area of increased attenuation measures 1.8 by 1.5 cm. There is no appreciable surrounding edema. There is no area suspicious for mass. There is no acute hemorrhage, extra-axial fluid collection, or midline shift. There is patchy small vessel disease in the centra semiovale bilaterally. No acute appearing infarct is appreciable. Bony calvarium appears intact. The mastoid  air cells are clear. There is opacification of the left maxillary antrum. There is also mucosal thickening in multiple ethmoid air cells bilaterally.  IMPRESSION: On axial slice 15, there is increased attenuation in the midline subfrontal region. There is nearby bone, although the appearance in this area is not the typical appearance of volume averaging from bone. This appearance is compatible with subfrontal meningioma without surrounding edema. This finding may warrant contrast enhanced CT or MR to further assess with respect to potential lesion in this area.  There is age related volume loss with patchy periventricular small vessel disease. No acute appearing infarct seen. No hemorrhage. There is paranasal sinus disease, primarily involving the left maxillary antrum.   Electronically Signed   By: Lowella Grip M.D.   On: 09/27/2013 07:37   Mr Jeri Cos RJ Contrast  09/27/2013   CLINICAL DATA:  Headache and fever.  Abnormal CT scan.  EXAM: MRI HEAD WITHOUT AND WITH CONTRAST  TECHNIQUE: Multiplanar, multiecho pulse sequences of the brain and surrounding structures were obtained without and with intravenous contrast.  CONTRAST:  49mL MULTIHANCE GADOBENATE DIMEGLUMINE 529 MG/ML IV SOLN  COMPARISON:  CT head from the same day.  FINDINGS: Dilated perivascular spaces are present within the basal ganglia. Mild atrophy and minimal white matter changes are within normal limits for age.  Flow is present in the major intracranial arteries.  The left maxillary sinus is opacified. Minimal fluid levels are present scratch the small fluid levels are present within scattered ethmoid air cells. There is a small fluid level in the right sphenoid sinus. The mastoid air cells are clear.  The postcontrast images demonstrate no pathologic enhancement. No focal lesion is present along the planum sphenoidale. The abnormality at CT was likely partial voluming.  IMPRESSION: 1. No focal lesion along the planum sphenoidale. The  abnormality on CT is likely partial voluming. 2. Normal MRI appearance of the brain for age. 3. Mild diffuse sinus disease as described. The left maxillary sinus is completely opacified.   Electronically Signed   By: Lawrence Santiago M.D.   On: 09/27/2013 13:41   Dg Chest Port 1 View  09/27/2013   CLINICAL DATA:  Altered mental status.  EXAM: PORTABLE CHEST - 1 VIEW  COMPARISON:  06/30/2013  FINDINGS: There is dense consolidation in the right lower lung, new from the prior study, consistent with pneumonia in the proper clinical setting. Milder patchy airspace opacity is noted in the medial left lung base likely additional infiltrate.  No pleural effusion.  No pneumothorax.  No evidence of edema.  Cardiac silhouette is normal in size.  Normal mediastinal contour.  IMPRESSION: Right greater than left lower lung zone consolidation consistent with multifocal pneumonia.   Electronically Signed   By: Lajean Manes M.D.   On: 09/27/2013 07:58    ASSESSMENT / PLAN:  Recurrent CAP - I favor aspiration as the most likely cause. She is not immunocompromised. Infiltrates have resolved each time , so I do not suspect underlying malignancy.  She does have left maxillary sinusistis but I doubt this is contributing. GERd is reportedly controlled. She does not have esophageal issues. I suspect aspiration may be related to her pain meds.  She was very receptive to this conversation today & will discuss with her pain doctors  Altered mental status - she did not receive narcan -this would have been diagnostic. I do not think she has meningitis.  FU appt made in 2wks for FU imaging Will sign off,  Kara Mead MD. [facility]. [place] [facility] Pager [phone] If no response call [phone]    09/28/2013, 11:24 AM

## 2013-09-28 NOTE — Progress Notes (Signed)
PT Discharge Note  Patient Details Name: Bianca Haley MRN: [id number] DOB: 1951-03-16   Cancelled Treatment:    Reason Eval Not Completed: PT screened, no needs identified, will sign off  Pt and nurse report she has been up to bathroom several times today and is moving well. Only needs assist due to multiple lines/monitors. She denies balance problems PTA or falls/near-falls in past six months. She did not want to participate in a PT evaluation this pm.   Jeanie Cooks Lashuna Tamashiro 09/28/2013, 4:05 PM Pager [phone]

## 2013-09-28 NOTE — Progress Notes (Signed)
OT Cancellation Note  Patient Details Name: Bianca Haley MRN: [id number] DOB: 11-Aug-1950   Cancelled Treatment:    Reason Eval/Treat Not Completed: Patient not medically ready (potassium level was 2.9)   Peri Maris Pager: [phone]  09/28/2013, 9:34 AM

## 2013-09-29 ENCOUNTER — Inpatient Hospital Stay (HOSPITAL_COMMUNITY): Payer: Medicare Other

## 2013-09-29 ENCOUNTER — Encounter: Payer: 59 | Admitting: Internal Medicine

## 2013-09-29 LAB — PROCALCITONIN: PROCALCITONIN: 1.97 ng/mL

## 2013-09-29 MED ORDER — ALPRAZOLAM 0.25 MG PO TABS: ORAL_TABLET | ORAL | Status: DC

## 2013-09-29 MED ORDER — LEVOFLOXACIN 750 MG PO TABS: 750.0000 mg | ORAL_TABLET | Freq: Every day | ORAL | Status: AC

## 2013-09-29 MED ORDER — LEVOFLOXACIN 750 MG PO TABS
750.0000 mg | ORAL_TABLET | Freq: Every day | ORAL | Status: DC
  Administered 2013-09-29: 750 mg via ORAL
  Filled 2013-09-29: qty 1

## 2013-09-29 MED ORDER — PANTOPRAZOLE SODIUM 40 MG PO TBEC: 40.0000 mg | DELAYED_RELEASE_TABLET | Freq: Two times a day (BID) | ORAL | Status: DC

## 2013-09-29 MED ORDER — OXYMORPHONE HCL 10 MG PO TABS: ORAL_TABLET | ORAL | Status: DC

## 2013-09-29 NOTE — Procedures (Signed)
Objective Swallowing Evaluation: Modified Barium Swallowing Study  Patient Details  Name: Bianca Haley MRN: [id number] Date of Birth: Sep 10, 1950  Today's Date: 09/29/2013 Time: 1200-1215 SLP Time Calculation (min): 15 min  Past Medical History:  Past Medical History  Diagnosis Date  . Hypertension   . Back pain   . Headache(784.0)   . Migraine   . Anemia   . Anxiety   . History of UTI   . History of endometriosis   . History of uterine fibroid   . H/O tinnitus     left  . GERD (gastroesophageal reflux disease)    Past Surgical History:  Past Surgical History  Procedure Laterality Date  . Abdominal hysterectomy    . Bladder suspension    . Back surgery    . Colonoscopy  02/16/2008    normal  . Laparoscopy      for evauation of endometriosis   HPI:  63 y.o  history of hypertension, migraine headaches, GERD, UTI, history of endometriosis, history of recurrent pneumonias with 4 episodes since October of 2014 who presents to the ED with altered mental status.  Per Erin Hearing, PA, pt. has cervical spine pain.  A CT cervical spine revealed Right-sided uncinate hypertrophy and 1 mm facet mediated anterolisthesis likely results in right C6 nerve root encroachment. CT negative for any hemorrhage or mass or midline shift or extra axial fluid collection however was concerning for possible subfrontal meningioma. MRI Normal MRI appearance of the brain for age.  Chest x-ray showed right greater than left lower lung zone consolidation consistent with multifocal pneumonia. Esophagram 02/23/13 normal.  In ED pt. with 104 temp with a heart rate of 138.      Assessment / Plan / Recommendation Clinical Impression  Dysphagia Diagnosis: Suspected primary esophageal dysphagia Clinical impression: Pt.'s oral and pharyngeal phases of swallow were within normal limits with timely swallow initiation, adequate laryngeal elevation and anterior excursion.  No penetration or aspiration observed.   Suspect a primary esophageal dyshagia as barium pill stopped mid esophagus requiring additional thin barium to facilitate transit to stomach (no radiologist present and SLP does not diagnose deficits below level of UES).  Pt. exhibited frequent throat clearing without evidence of barium entering laryngeal vestibule.  She may benefit from GI consult to further assess.  SLP educated pt. on esophageal precautions to decrease symptoms.  No f/u ST needed.      Treatment Recommendation  No treatment recommended at this time    Diet Recommendation Regular;Thin liquid   Liquid Administration via: Cup;Straw Medication Administration: Whole meds with liquid Supervision: Patient able to self feed Compensations: Slow rate;Small sips/bites;Follow solids with liquid Postural Changes and/or Swallow Maneuvers: Seated upright 90 degrees;Upright 30-60 min after meal    Other  Recommendations Oral Care Recommendations: Oral care BID   Follow Up Recommendations  None    Frequency and Duration        Pertinent Vitals/Pain WDL          Reason for Referral Objectively evaluate swallowing function   Oral Phase Oral Preparation/Oral Phase Oral Phase: WFL   Pharyngeal Phase Pharyngeal Phase Pharyngeal Phase: Within functional limits  Cervical Esophageal Phase    GO    Cervical Esophageal Phase Cervical Esophageal Phase: [facility]         Cranford Mon.[employer] [phone]  09/29/2013

## 2013-09-29 NOTE — Evaluation (Signed)
Clinical/Bedside Swallow Evaluation Patient Details  Name: Bianca Haley MRN: [id number] Date of Birth: 03/06/1951  Today's Date: 09/29/2013 Time: 7425-9563 SLP Time Calculation (min): 16 min  Past Medical History:  Past Medical History  Diagnosis Date  . Hypertension   . Back pain   . Headache(784.0)   . Migraine   . Anemia   . Anxiety   . History of UTI   . History of endometriosis   . History of uterine fibroid   . H/O tinnitus     left  . GERD (gastroesophageal reflux disease)    Past Surgical History:  Past Surgical History  Procedure Laterality Date  . Abdominal hysterectomy    . Bladder suspension    . Back surgery    . Colonoscopy  02/16/2008    normal  . Laparoscopy      for evauation of endometriosis   HPI:  63 y.o  history of hypertension, migraine headaches, GERD, UTI, history of endometriosis, history of recurrent pneumonias with 4 episodes since October of 2014 who presents to the ED with altered mental status.  Per Erin Hearing, PA, pt. has cervical spine pain.  A CT cervical spine revealed Right-sided uncinate hypertrophy and 1 mm facet mediated anterolisthesis likely results in right C6 nerve root encroachment. CT negative for any hemorrhage or mass or midline shift or extra axial fluid collection however was concerning for possible subfrontal meningioma. MRI Normal MRI appearance of the brain for age.  Chest x-ray showed right greater than left lower lung zone consolidation consistent with multifocal pneumonia. Esophagram 02/23/13 normal.  In ED pt. with 104 temp with a heart rate of 138.    Assessment / Plan / Recommendation Clinical Impression  SLP suspects pt. is experiencing a primary esophageal dysphagia due to her symptoms/complaints including:  frequent waking and coughing at night globus sensation if she "eats large bites and too fast", and a "bad stomach/acid/sour."  She likely would benefit from a GI consult with endoscopy, however, given 4 pna  episodes in 6 months, pt's verbalized symptoms and possible cervical involvement, recommend MBS to fually assess pharyngeal phase swallow.  MBS scheduled today at 12:00.  Can continue current diet/liquids.    Aspiration Risk  Moderate    Diet Recommendation Regular;Thin liquid   Liquid Administration via: Cup;Straw Medication Administration: Whole meds with liquid Supervision: Patient able to self feed Compensations: Slow rate;Small sips/bites Postural Changes and/or Swallow Maneuvers: Seated upright 90 degrees;Upright 30-60 min after meal    Other  Recommendations Recommended Consults: MBS Oral Care Recommendations: Oral care BID   Follow Up Recommendations   (TBD)    Frequency and Duration        Pertinent Vitals/Pain WDL         Swallow Study         Oral/Motor/Sensory Function Overall Oral Motor/Sensory Function: Appears within functional limits for tasks assessed   Ice Chips Ice chips: Not tested   Thin Liquid Thin Liquid: Within functional limits Presentation: Cup    Nectar Thick Nectar Thick Liquid: Not tested   Honey Thick Honey Thick Liquid: Not tested   Puree Puree: Not tested   Solid   GO    Solid: Within functional limits (observed a Dys 3 texture)       Orbie Pyo Niki Cosman M.Ed [employer] [phone]  09/29/2013

## 2013-09-29 NOTE — Discharge Summary (Signed)
Physician Discharge Summary  Bianca Haley [id number] DOB: 12/20/1950 DOA: 09/27/2013  PCP: Bianca Kehr, MD  Admit date: 09/27/2013 Discharge date: 09/29/2013  Time spent: >30 minutes  Recommendations for Outpatient Follow-up:  1. Follow up with pulmonologist as scheduled 2. Follow up with pain clinic as scheduled 3. Recommend OP gastroenterology evaluation re: severe GERD and associated suspected primary esophageal dysmotility  Discharge Diagnoses:    Recurrent pneumonia/aspiration pneumonitis (based on history)   Encephalopathy acute - resolved   Hypoxia - resolved   Chronic neck and back pain   HYPERTENSION   Reflux - not controlled   Abnormal CT scan, head-MRI was normal   Hypokalemia-resolved   UNSPECIFIED ANEMIA   ANXIETY  Discharge Condition: stable  Diet recommendation: Regular  Filed Weights   09/27/13 1450 09/28/13 0424  Weight: 141 lb 5 oz (64.1 kg) 139 lb 15.9 oz (63.5 kg)    History of present illness:  63 y.o. Female w/ history of hypertension, migraine headaches, endometriosis, recurrent pneumonias with 4 episodes since October of 2014. Presented to the ED with altered mental status. Per her fiancee patient was noted to have some subjective fevers, chills, right worse, wheezing around 3 AM on the morning of admission. Patient subsequently went to sleep patient then woke up again around 5 AM with rigors and incoherent speech. Her fianc called 911 and patient was brought to the emergency room. Patient did endorse a cough over the past 1-1/2 days with some subjective fevers and chills. Patient denied any shortness of breath. Patient did endorse some photophobia, some nausea and some generalized weakness.   On arrival to the ED patient was noted to have a temperature of 104 with a heart rate of 138. Per ED physician patient more alert and following commands. CT of the head which was obtained was negative for any hemorrhage or mass or midline shift or extra  axial fluid collection however was concerning for possible subfrontal meningioma. Chest x-ray which was done showed right greater than left lower lung zone consolidation consistent with multifocal pneumonia. ED venous blood gas: pH of 7.42 PCO2 of 46 PO2 of 26 bicarbonate of 29 and comprehensive metabolic profile obtained was unremarkable. Lactic acid level was 2.02. CBC obtained was unremarkable. Lumbar puncture was done per ED physician and patient placed empirically on IV vancomycin, Rocephin, acyclovir and Decadron.   Hospital Course:  Recurrent pneumonia/Hypoxia  -pulmonary medicine assisted this admission -Agree that pulmonary symptoms seem to be mediated by excessive sedation related to patient's multiple pain and antianxiety medications causing an aspiration pneumonitis - see below regarding issues concerning for possible primary esophageal dysmotility which in setting of excessive sedation increase risk for aspiration event -Hypoxia resolved and likely related to altered mentation with associated low lung volumes  -Procalcitonin less than 0.10 -transition to Levaquin at DC and give for total 10 days therapy  Encephalopathy acute  -Has resolved and seems related to pre-admission medications  -LP unremarkable and not consistent with meningitis   Chronic neck and back pain  -Patient endorsed follows with pain clinic who has been prescribing her benzodiazepines and opiates as well as her gabapentin  -Had back surgery 1-1/2 years ago and now endorses has 2 new areas in the back and neck which likely will require surgery (Dr. Joya Haley) but patient has been using medical therapy until as she puts it " I absolutely have to have surgery".  -We decreased Xanax from 0.5 to 0.25 mg this admission and will continue this dose after  dc -Opana not formulary and note patient on equivalent dose of MS Contin that would equal 30 mg every 6 hours when necessary-instead I have ordered MS Contin 15 mg every 12  hours scheduled -will attempt tocontinue at dc if home Opana can be cut in half and given as 5 mg dose instead. If can't cut in half due to extended release formulation have recommended that decrease to only using q 12 hours and use cautiously until can be seen at the pain clinic -Pulmonologist (Dr. Elsworth Soho) discussed concerns that recurrent aspiration related to pain medications with the patient and he plans on discussing with her pain doctors as well   HYPERTENSION  -Controlled   Reflux  -Had unremarkable esophagram October 2014  -pt informed MD 5/29 that she has severe am "acid stomach" for which she takes daily PPI -also complains of nocturnal cough that is positional  -MBSS this admit without dysphagia but raised concerns of possible primary esophageal dysmotility as barium pill stopped mid esophagus requiring additional thin barium to facilitate transit to stomach (no radiologist present and SLP does not diagnose deficits below level of UES). -have increase PPI to BID this admit  Abnormal CT scan, head  -Initial CT obtained in the ER was concerning for possible subfrontal meningioma without surrounding edema.  -MRI was obtained to clarify and was normal without the above findings  Hypokalemia  -Oral replete   UNSPECIFIED ANEMIA   ANXIETY   Procedures: Lumbar puncture in the emergency department  Consultations:  Pulmonary medicine  Discharge Exam: Filed Vitals:   09/29/13 1403  BP: 148/67  Pulse:   Temp: 97.3 F (36.3 C)  Resp:    General: No acute respiratory distress  Lungs: Clear to auscultation bilaterally without wheezes or crackles, RA  Cardiovascular: Regular rate and rhythm without murmur gallop or rub normal S1 and S2, no peripheral edema or JVD  Abdomen: Nontender, nondistended, soft, bowel sounds positive, no rebound, no ascites, no appreciable mass  Musculoskeletal: No significant cyanosis, clubbing of bilateral lower extremities-noted paracervical spasms  reproducible upon exam  Neurological: Awake and oriented x 3, moves all extremities x 4 without focal neurological deficits, CN 2-12 intact  Discharge Instructions     Discharge Instructions   Call MD for:  extreme fatigue    Complete by:  As directed      Call MD for:  hives    Complete by:  As directed      Call MD for:  persistant nausea and vomiting    Complete by:  As directed      Call MD for:  temperature >100.4    Complete by:  As directed      Diet general    Complete by:  As directed      Increase activity slowly    Complete by:  As directed             Medication List         albuterol 108 (90 BASE) MCG/ACT inhaler  Commonly known as:  PROVENTIL HFA;VENTOLIN HFA  2 puffs twice daily for 5 days, then q6h PRN cough/wheeze     ALPRAZolam 0.25 MG tablet  Commonly known as:  XANAX  We recommend using 1/2 or your 0.5 mg tabs every 6 hours as needed for anxiety     atenolol 100 MG tablet  Commonly known as:  TENORMIN  Take 100 mg by mouth daily.     celecoxib 200 MG capsule  Commonly known as:  CELEBREX  Take 1 capsule (200 mg total) by mouth daily.     diltiazem 180 MG 24 hr capsule  Commonly known as:  TIAZAC  Take 180 mg by mouth daily.     gabapentin 300 MG capsule  Commonly known as:  NEURONTIN  Take 300-900 mg by mouth 2 (two) times daily. Take 300 mg every morning AS NEEDED.  Take 900 mg every night at bedtime.     levofloxacin 750 MG tablet  Commonly known as:  LEVAQUIN  Take 1 tablet (750 mg total) by mouth daily.     multivitamin with minerals Tabs tablet  Take 1 tablet by mouth daily.     oxymorphone 10 MG tablet  Commonly known as:  OPANA  Take 1/2 of your 10 mg tab twice daily as needed for pain-if unable to break tab in half due to extended release formulation recommend cautious use of this medication at same (10 mg) dosage- follow up with your pain clinic to discuss changes in medication dosages     pantoprazole 40 MG tablet  Commonly  known as:  PROTONIX  Take 1 tablet (40 mg total) by mouth 2 (two) times daily. Frequency has been increased due to suspicion of acute on chronic recurrent reflux causing recurrent aspiration pneumonitis     PARoxetine 40 MG tablet  Commonly known as:  PAXIL  Take 40 mg by mouth 2 (two) times daily.     simvastatin 40 MG tablet  Commonly known as:  ZOCOR  Take 20 mg by mouth daily.     SUMAtriptan 100 MG tablet  Commonly known as:  IMITREX  Take 100 mg by mouth every 2 (two) hours as needed for migraine or headache. May repeat in 2 hours if headache persists or recurs.     traZODone 100 MG tablet  Commonly known as:  DESYREL  Take 100 mg by mouth at bedtime as needed for sleep.       Allergies  Allergen Reactions  . Penicillins Shortness Of Breath and Rash  . Morphine And Related Swelling    Severe pain Morphine only; other opioids OK   Follow-up Information   Follow up with PARRETT,TAMMY, NP On 10/12/2013. (9 AM)    Specialty:  Nurse Practitioner   Contact information:   [place]. [place] [address] [phone]       Please follow up. (KEEP PREVIOUSLY SCHEDULED APPOINTMENTS WITH THE PULMONOLOGIST AND YOUR PRIMARY CARE DOCTOR)       Please follow up. (CALL YOUR PAIN CLINIC TO ARRANGE FOLLOW TO BE SEEN IN NECT 1-2 WEEKS)      Microbiology: Recent Results (from the past 240 hour(s))  CULTURE, BLOOD (ROUTINE X 2)     Status: None   Collection Time    09/27/13  7:50 AM      Result Value Ref Range Status   Specimen Description BLOOD RIGHT ANTECUBITAL   Final   Special Requests BOTTLES DRAWN AEROBIC AND ANAEROBIC R 5CC B 10CC   Final   Culture  Setup Time     Final   Value: 09/27/2013 12:39     Performed at [facility]   Culture     Final   Value:        BLOOD CULTURE RECEIVED NO GROWTH TO DATE CULTURE WILL BE HELD FOR 5 DAYS BEFORE ISSUING A FINAL NEGATIVE REPORT     Performed at [facility]   Report Status PENDING   Incomplete  CULTURE,  BLOOD (ROUTINE X 2)     Status: None   Collection Time    09/27/13  8:00 AM      Result Value Ref Range Status   Specimen Description BLOOD RIGHT HAND   Final   Special Requests BOTTLES DRAWN AEROBIC ONLY 10CC   Final   Culture  Setup Time     Final   Value: 09/27/2013 12:38     Performed at [facility]   Culture     Final   Value:        BLOOD CULTURE RECEIVED NO GROWTH TO DATE CULTURE WILL BE HELD FOR 5 DAYS BEFORE ISSUING A FINAL NEGATIVE REPORT     Performed at [facility]   Report Status PENDING   Incomplete  URINE CULTURE     Status: None   Collection Time    09/27/13  8:11 AM      Result Value Ref Range Status   Specimen Description URINE, RANDOM   Final   Special Requests URINE, RANDOM   Final   Culture  Setup Time     Final   Value: 09/27/2013 13:03     Performed at [place]     Final   Value: NO GROWTH     Performed at [facility]   Culture     Final   Value: NO GROWTH     Performed at [facility]   Report Status 09/28/2013 FINAL   Final  CSF CULTURE     Status: None   Collection Time    09/27/13  9:13 AM      Result Value Ref Range Status   Specimen Description CSF   Final   Special Requests 2.0   Final   Gram Stain     Final   Value: CYTOSPIN NO WBC SEEN     NO ORGANISMS SEEN     Performed at [facility]     Performed at [facility]   Culture     Final   Value: NO GROWTH 2 DAYS     Performed at [facility]   Report Status PENDING   Incomplete  GRAM STAIN     Status: None   Collection Time    09/27/13  9:13 AM      Result Value Ref Range Status   Specimen Description CSF   Final   Special Requests 2.0CSF FLUID   Final   Gram Stain     Final   Value: CYTOSPIN SLIDE     NO WBC SEEN     NO ORGANISMS SEEN   Report Status 09/27/2013 FINAL   Final  MRSA PCR SCREENING     Status: None   Collection Time    09/27/13  2:48 PM      Result Value Ref Range Status   MRSA by  PCR NEGATIVE  NEGATIVE Final   Comment:            The GeneXpert MRSA Assay (FDA     approved for NASAL specimens     only), is one component of a     comprehensive MRSA colonization     surveillance program. It is not     intended to diagnose MRSA     infection nor to guide or     monitor treatment for     MRSA infections.     Labs: Basic Metabolic Panel:  Recent Labs Lab 09/27/13  1761 09/27/13 1548 09/28/13 0249 09/28/13 0909  NA 145  --  142  --   K 3.8  --  2.9* 3.5*  CL 103  --  100  --   CO2 27  --  29  --   GLUCOSE 147*  --  151*  --   BUN 16  --  7  --   CREATININE 0.76 0.62 0.53  --   CALCIUM 9.5  --  9.5  --   MG  --  1.5  --   --    Liver Function Tests:  Recent Labs Lab 09/27/13 0811 09/28/13 0249  AST 36 26  ALT 17 15  ALKPHOS 94 88  BILITOT 0.3 0.5  PROT 7.3 6.9  ALBUMIN 3.8 3.3*   CBC:  Recent Labs Lab 09/27/13 0811 09/27/13 1548 09/28/13 0249  WBC 5.7 13.9* 15.3*  NEUTROABS 4.3  --  14.1*  HGB 13.5 13.0 12.2  HCT 40.4 37.2 34.3*  MCV 83.8 81.8 80.7  PLT 185 191 231   Signed:  Samella Parr ANP [facility] 09/29/2013, 3:07 PM  I have personally examined this patient and reviewed the entire database. I have reviewed the above note, made any necessary editorial changes, and agree with its content.  Cherene Altes, MD [facility]

## 2013-09-30 LAB — CSF CULTURE
GRAM STAIN: NONE SEEN
SPECIAL REQUESTS: 2

## 2013-09-30 LAB — CSF CULTURE W GRAM STAIN: Culture: NO GROWTH

## 2013-10-03 LAB — CULTURE, BLOOD (ROUTINE X 2)
CULTURE: NO GROWTH
Culture: NO GROWTH

## 2013-10-11 DIAGNOSIS — M5416 Radiculopathy, lumbar region: Secondary | ICD-10-CM | POA: Insufficient documentation

## 2013-10-12 ENCOUNTER — Ambulatory Visit (INDEPENDENT_AMBULATORY_CARE_PROVIDER_SITE_OTHER)
Admission: RE | Admit: 2013-10-12 | Discharge: 2013-10-12 | Disposition: A | Discharge status: Home / Self Care | Payer: Medicare Other | Source: Ambulatory Visit | Attending: Adult Health | Admitting: Adult Health

## 2013-10-12 ENCOUNTER — Encounter: Payer: Self-pay | Admitting: Adult Health

## 2013-10-12 ENCOUNTER — Ambulatory Visit (INDEPENDENT_AMBULATORY_CARE_PROVIDER_SITE_OTHER): Payer: Medicare Other | Admitting: Adult Health

## 2013-10-12 VITALS — BP 132/66 | HR 68 | Temp 98.4°F | Ht 65.5 in | Wt 145.1 lb

## 2013-10-12 DIAGNOSIS — J189 Pneumonia, unspecified organism: Secondary | ICD-10-CM

## 2013-10-12 NOTE — Progress Notes (Signed)
Subjective:    Patient ID: Bianca Haley, female    DOB: [birth year], 63 y.o.   MRN: [id number]  HPI 71 never smoker first seen for chronic cough x 21mnths on   02/21/13  She was diagnosed with RLL community acquired pneumonia on 01/04/13 & treated with levaquin with clearing of infiltrate on 01/26/13.  Admitted 02/21/13 for recurrent RLL PNA , hypotension. CT chest showed RML/RLL pneumonia and what appeared to be some progression of bronchiectasis.  CT chest 10/2009 >> Scattered areas of scar and bronchiectasis in both lungs,adjacent calcified granulomata in the right lower lobe.  Esophagram was negative. IgG,IgaA and IgM levels were low, but not to the extent we felt she was  immunosuppressed.  CXR 03/23/13 clearing of infiltrates     06/2013 - she went to [facility] and was diagnosed with pneumonia over the weekend. She states they placed her on doxycycline and mucinex. Her cough improved and is now non prod, energy level and appetite coming back  She does however, c/o hearing "crackles and pops in upper and lower lungs" CXR 06/30/13 clear - denied further abx Takes opana once in am Neurontin daily, xanax helped with anxiety Needs protonix daily- dietary avoidance & changes have been made for GERD SO was hospitalised after back surgery & ensuing complications, now they live separately  10/12/2013 [facility] follow up  Patient returns for a post hospital followup. She was admitted may 27 through may 29th for recurrent PNA /aspiration pneumonitis . On arrival with fever -tmax 104, altered mental status  w/ resp distress. MRI brain  without acute findings.  and LP unrevealing not c/w meningitis.  CXR showed R>L consolidation c/w multifocal PNA.  It was felt that along with her esophageal dysmotility /GERD and excessive sedation she is at risk for aspiration.  Her pain meds were adjusted prior to discharge.  A MBSS this admit without dysphagia but raised concerns of possible primary esophageal  dysmotility as barium pill stopped mid esophagus requiring additional thin barium to facilitate transit to stomach , PPI  to BID prior to discharge.  CXR today shows near complete resolution of bilateral infiltrates.   Since discharge she is feeling better. No fever. Reports breathing is much improved since discharge.   Denies any dyspnea, wheezing, tightness, congestion.  no new complaints. No overt reflux . No increased cough or congestion  We discussed strict GERD diet and limiting pain meds as this is most likely her source of recurrent PNA (sedation and esophageal dysmolitiy that are contributing to aspiration)    She does tell me she twisted her knee yesterday with swelling noted and has ov with PCP for evaluation in am.  Advised her that if knee worsens may need to see UC or ER for evaluation .    Review of Systems  neg for any significant sore throat, dysphagia, itching, sneezing, nasal congestion or excess/ purulent secretions, fever, chills, sweats, unintended wt loss, pleuritic or exertional cp, hempoptysis, orthopnea pnd or change in chronic leg swelling. Also denies presyncope, palpitations, heartburn, abdominal pain, nausea, vomiting, diarrhea or change in bowel or urinary habits, dysuria,hematuria, rash, arthralgias, visual complaints, headache, numbness weakness or ataxia. +knee pain      Objective:   Physical Exam   Gen. Pleasant, well-nourished, in no distress ENT - no lesions, no post nasal drip Neck: No JVD, no thyromegaly, no carotid bruits Lungs: no use of accessory muscles, no dullness to percussion, clear without rales or rhonchi  Cardiovascular: Rhythm regular, heart  sounds  normal, no murmurs or gallops, no peripheral edema Musculoskeletal: No deformities, no cyanosis or clubbing  Left knee swelling noted , mild limp   10/12/2013 Near complete resolution of bilateral infiltrates.       Assessment & Plan:

## 2013-10-12 NOTE — Assessment & Plan Note (Signed)
Recurrent PNA/aspiration pneumonitis in setting of esophageal dysmotility and chronic narcotic use/sedated state  >advised on prevention regimen  Plan  Call for first signs of pneumonia -cough, fever, shortness of breath, confusion  Albuterol as needed only for wheezing. GERD diet  Avoid eating 3 hours before bedtime  Do not lie down after eating .  Limit pain medications as much as possible as they make you sleepy and more prone to aspiration.  Follow up Dr. Elsworth Soho  In 4 weeks and As needed   Follow up with Dr. Alain Marion for your knee as planned

## 2013-10-12 NOTE — Patient Instructions (Signed)
Call for first signs of pneumonia -cough, fever, shortness of breath, confusion  Albuterol as needed only for wheezing. GERD diet  Avoid eating 3 hours before bedtime  Do not lie down after eating .  Limit pain medications as much as possible as they make you sleepy and more prone to aspiration.  Follow up Dr. Elsworth Soho  In 4 weeks and As needed   Follow up with Dr. Alain Marion for your knee as planned

## 2013-10-13 ENCOUNTER — Ambulatory Visit (INDEPENDENT_AMBULATORY_CARE_PROVIDER_SITE_OTHER): Payer: Medicare Other | Admitting: Internal Medicine

## 2013-10-13 ENCOUNTER — Encounter: Payer: Self-pay | Admitting: Internal Medicine

## 2013-10-13 ENCOUNTER — Other Ambulatory Visit (INDEPENDENT_AMBULATORY_CARE_PROVIDER_SITE_OTHER): Payer: Medicare Other

## 2013-10-13 VITALS — BP 150/88 | HR 76 | Temp 98.6°F | Resp 16 | Wt 141.0 lb

## 2013-10-13 DIAGNOSIS — G8929 Other chronic pain: Secondary | ICD-10-CM

## 2013-10-13 DIAGNOSIS — M7989 Other specified soft tissue disorders: Secondary | ICD-10-CM

## 2013-10-13 DIAGNOSIS — F1021 Alcohol dependence, in remission: Secondary | ICD-10-CM

## 2013-10-13 DIAGNOSIS — M542 Cervicalgia: Secondary | ICD-10-CM

## 2013-10-13 DIAGNOSIS — I10 Essential (primary) hypertension: Secondary | ICD-10-CM

## 2013-10-13 DIAGNOSIS — M25562 Pain in left knee: Secondary | ICD-10-CM

## 2013-10-13 DIAGNOSIS — M549 Dorsalgia, unspecified: Secondary | ICD-10-CM

## 2013-10-13 DIAGNOSIS — M25569 Pain in unspecified knee: Secondary | ICD-10-CM

## 2013-10-13 LAB — PROTIME-INR
INR: 1 ratio (ref 0.8–1.0)
Prothrombin Time: 11.5 s (ref 9.6–13.1)

## 2013-10-13 LAB — BASIC METABOLIC PANEL
BUN: 11 mg/dL (ref 6–23)
CO2: 28 mEq/L (ref 19–32)
Calcium: 9.4 mg/dL (ref 8.4–10.5)
Chloride: 102 mEq/L (ref 96–112)
Creatinine, Ser: 0.7 mg/dL (ref 0.4–1.2)
GFR: 93.84 mL/min (ref >60.00)
Glucose, Bld: 86 mg/dL (ref 70–99)
Potassium: 3.9 mEq/L (ref 3.5–5.1)
Sodium: 139 mEq/L (ref 135–145)

## 2013-10-13 LAB — CBC WITH DIFFERENTIAL/PLATELET
BASOS PCT: 0.4 % (ref 0.0–3.0)
Basophils Absolute: 0 10*3/uL (ref 0.0–0.1)
EOS ABS: 0 10*3/uL (ref 0.0–0.7)
Eosinophils Relative: 0.1 % (ref 0.0–5.0)
HCT: 37.6 % (ref 36.0–46.0)
Hemoglobin: 12.6 g/dL (ref 12.0–15.0)
Lymphocytes Relative: 14.3 % (ref 12.0–46.0)
Lymphs Abs: 1.2 10*3/uL (ref 0.7–4.0)
MCHC: 33.6 g/dL (ref 30.0–36.0)
MCV: 84 fl (ref 78.0–100.0)
MONO ABS: 0.5 10*3/uL (ref 0.1–1.0)
Monocytes Relative: 6 % (ref 3.0–12.0)
NEUTROS ABS: 6.9 10*3/uL (ref 1.4–7.7)
NEUTROS PCT: 79.2 % — AB (ref 43.0–77.0)
Platelets: 300 10*3/uL (ref 150.0–400.0)
RBC: 4.48 Mil/uL (ref 3.87–5.11)
RDW: 13.6 % (ref 11.5–15.5)
WBC: 8.7 10*3/uL (ref 4.0–10.5)

## 2013-10-13 MED ORDER — MELOXICAM 7.5 MG PO TABS: 7.5000 mg | ORAL_TABLET | Freq: Every day | ORAL | Status: DC | PRN

## 2013-10-13 MED ORDER — RIVAROXABAN 15 MG PO TABS: 15.0000 mg | ORAL_TABLET | Freq: Two times a day (BID) | ORAL | Status: DC

## 2013-10-13 MED ORDER — VITAMIN D 1000 UNITS PO TABS: 1000.0000 [IU] | ORAL_TABLET | Freq: Every day | ORAL | Status: AC

## 2013-10-13 NOTE — Assessment & Plan Note (Signed)
Dr [facility] - Opana

## 2013-10-13 NOTE — Assessment & Plan Note (Signed)
Continue with current prescription therapy as reflected on the Med list.  

## 2013-10-13 NOTE — Progress Notes (Signed)
   Subjective:    Knee Pain  The incident occurred 3 to 5 days ago. The incident occurred at home. The injury mechanism was a twisting injury (got out of bed). The pain is present in the left knee. The quality of the pain is described as aching. The pain is mild. The pain has been constant since onset. Pertinent negatives include no inability to bear weight, loss of motion or numbness. The symptoms are aggravated by movement. She has tried acetaminophen (on Opana) for the symptoms. The treatment provided mild relief.  F/u recent pneumonia - hospitalized 1 wk ago  New pt - Dr Linda Hedges    Review of Systems  Constitutional: Negative for chills, activity change, appetite change, fatigue and unexpected weight change.  HENT: Negative for congestion, mouth sores and sinus pressure.   Eyes: Negative for visual disturbance.  Respiratory: Negative for cough and chest tightness.   Gastrointestinal: Negative for nausea and abdominal pain.  Genitourinary: Negative for frequency, difficulty urinating and vaginal pain.  Musculoskeletal: Negative for back pain and gait problem.  Skin: Negative for pallor and rash.  Neurological: Negative for dizziness, tremors, weakness, numbness and headaches.  Psychiatric/Behavioral: Negative for suicidal ideas, confusion and sleep disturbance.        Objective:   Physical Exam  Constitutional: She appears well-developed. No distress.  HENT:  Head: Normocephalic.  Right Ear: External ear normal.  Left Ear: External ear normal.  Nose: Nose normal.  Mouth/Throat: Oropharynx is clear and moist.  Eyes: Conjunctivae are normal. Pupils are equal, round, and reactive to light. Right eye exhibits no discharge. Left eye exhibits no discharge.  Neck: Normal range of motion. Neck supple. No JVD present. No tracheal deviation present. No thyromegaly present.  Cardiovascular: Normal rate, regular rhythm and normal heart sounds.   Pulmonary/Chest: No stridor. No respiratory  distress. She has no wheezes.  Abdominal: Soft. Bowel sounds are normal. She exhibits no distension and no mass. There is no tenderness. There is no rebound and no guarding.  Musculoskeletal: She exhibits edema and tenderness.  LLE - knee and shin L shin is tender to palp over prox calf  Lymphadenopathy:    She has no cervical adenopathy.  Neurological: She displays normal reflexes. No cranial nerve deficit. She exhibits normal muscle tone. Coordination normal.  Skin: No rash noted. No erythema.  Psychiatric: She has a normal mood and affect. Her behavior is normal. Judgment and thought content normal.   LLE is swollen  Hosp records reviewed     Assessment & Plan:

## 2013-10-13 NOTE — Progress Notes (Signed)
Pre visit review using our clinic review tool, if applicable. No additional management support is needed unless otherwise documented below in the visit note. 

## 2013-10-13 NOTE — Assessment & Plan Note (Signed)
Not drinking 

## 2013-10-13 NOTE — Assessment & Plan Note (Addendum)
R/o DVT, Baker cyst and other D-dimer/ [place] Empiric Xarelto 15 mg bid x 21 d, then 20 mg qd - unable to r/o DVT under the circumstances RTC 1 wk

## 2013-10-13 NOTE — Patient Instructions (Signed)
Elevate leg Compression hose

## 2013-10-14 LAB — D-DIMER, QUANTITATIVE: D-Dimer, Quant: 0.48 ug{FEU}/mL (ref 0.00–0.48)

## 2013-10-16 NOTE — Progress Notes (Signed)
Reviewed & agree with plan  

## 2013-10-19 ENCOUNTER — Other Ambulatory Visit: Payer: Self-pay | Admitting: *Deleted

## 2013-10-19 MED ORDER — PAROXETINE HCL 40 MG PO TABS: 40.0000 mg | ORAL_TABLET | Freq: Two times a day (BID) | ORAL | Status: DC

## 2013-10-20 ENCOUNTER — Telehealth: Payer: Self-pay

## 2013-10-20 ENCOUNTER — Other Ambulatory Visit: Payer: Self-pay | Admitting: Internal Medicine

## 2013-10-20 NOTE — Telephone Encounter (Signed)
Phone call from patient stating she has 2 tablets of Xarelto left. Her symptoms are much better swelling is down just some knee pain. She has an appointment 10/25/13 to see you. She asks if she should get more tablets or finish the 2 and wait to her appointment.

## 2013-10-20 NOTE — Telephone Encounter (Signed)
Finish 2 tabs of Xarelto Move Doppler US appt up to early next week please Thx

## 2013-10-23 NOTE — Telephone Encounter (Signed)
Patient states she finished her Xarelto tablets and will be here Wednesday 6/24 to see you

## 2013-10-25 ENCOUNTER — Ambulatory Visit (INDEPENDENT_AMBULATORY_CARE_PROVIDER_SITE_OTHER): Payer: Medicare Other | Admitting: Internal Medicine

## 2013-10-25 ENCOUNTER — Telehealth: Payer: Self-pay | Admitting: Internal Medicine

## 2013-10-25 ENCOUNTER — Encounter: Payer: Self-pay | Admitting: Internal Medicine

## 2013-10-25 VITALS — BP 170/90 | HR 72 | Temp 98.5°F | Resp 16 | Wt 141.0 lb

## 2013-10-25 DIAGNOSIS — M25569 Pain in unspecified knee: Secondary | ICD-10-CM

## 2013-10-25 DIAGNOSIS — I10 Essential (primary) hypertension: Secondary | ICD-10-CM

## 2013-10-25 DIAGNOSIS — M25562 Pain in left knee: Secondary | ICD-10-CM

## 2013-10-25 DIAGNOSIS — J189 Pneumonia, unspecified organism: Secondary | ICD-10-CM

## 2013-10-25 DIAGNOSIS — M1712 Unilateral primary osteoarthritis, left knee: Secondary | ICD-10-CM | POA: Insufficient documentation

## 2013-10-25 DIAGNOSIS — I872 Venous insufficiency (chronic) (peripheral): Secondary | ICD-10-CM

## 2013-10-25 DIAGNOSIS — M1710 Unilateral primary osteoarthritis, unspecified knee: Secondary | ICD-10-CM

## 2013-10-25 MED ORDER — SUMATRIPTAN SUCCINATE 100 MG PO TABS: 100.0000 mg | ORAL_TABLET | Freq: Once | ORAL | Status: DC

## 2013-10-25 MED ORDER — PANTOPRAZOLE SODIUM 40 MG PO TBEC: 40.0000 mg | DELAYED_RELEASE_TABLET | Freq: Two times a day (BID) | ORAL | Status: DC

## 2013-10-25 MED ORDER — METHYLPREDNISOLONE ACETATE 40 MG/ML IJ SUSP
40.0000 mg | Freq: Once | INTRAMUSCULAR | Status: AC
  Administered 2013-10-25: 40 mg via INTRA_ARTICULAR

## 2013-10-25 NOTE — Patient Instructions (Signed)
Postprocedure instructions :    A Band-Aid should be left on for 12 hours. Injection therapy is not a cure itself. It is used in conjunction with other modalities. You can use nonsteroidal anti-inflammatories like ibuprofen , hot and cold compresses. Rest is recommended in the next 24 hours. You need to report immediately  if fever, chills or any signs of infection develop. 

## 2013-10-25 NOTE — Assessment & Plan Note (Signed)
Doing well on a PPI bid

## 2013-10-25 NOTE — Assessment & Plan Note (Signed)
[place] pending Leg swelling has resolved

## 2013-10-25 NOTE — Assessment & Plan Note (Signed)
swelling remains - will aspirate/inject Calf is NT and w/o swelling

## 2013-10-25 NOTE — Progress Notes (Signed)
Patient ID: Bianca Haley, female   DOB: 12-Jun-1950, 63 y.o.   MRN: [id number]   Subjective:    Knee Pain  The incident occurred more than 1 week ago. The incident occurred at home. The injury mechanism was a twisting injury (got out of bed). The pain is present in the left knee. The quality of the pain is described as aching. The pain is mild. The pain has been improving since onset. Pertinent negatives include no inability to bear weight, loss of motion or numbness. The symptoms are aggravated by movement. She has tried acetaminophen (on Opana) for the symptoms. The treatment provided mild relief.  F/u recent pneumonia - hospitalized 1 wk ago      Review of Systems  Constitutional: Negative for chills, activity change, appetite change, fatigue and unexpected weight change.  HENT: Negative for congestion, mouth sores and sinus pressure.   Eyes: Negative for visual disturbance.  Respiratory: Negative for cough and chest tightness.   Gastrointestinal: Negative for nausea and abdominal pain.  Genitourinary: Negative for frequency, difficulty urinating and vaginal pain.  Musculoskeletal: Negative for back pain and gait problem.  Skin: Negative for pallor and rash.  Neurological: Negative for dizziness, tremors, weakness, numbness and headaches.  Psychiatric/Behavioral: Negative for suicidal ideas, confusion and sleep disturbance.        Objective:   Physical Exam  Constitutional: She appears well-developed. No distress.  HENT:  Head: Normocephalic.  Right Ear: External ear normal.  Left Ear: External ear normal.  Nose: Nose normal.  Mouth/Throat: Oropharynx is clear and moist.  Eyes: Conjunctivae are normal. Pupils are equal, round, and reactive to light. Right eye exhibits no discharge. Left eye exhibits no discharge.  Neck: Normal range of motion. Neck supple. No JVD present. No tracheal deviation present. No thyromegaly present.  Cardiovascular: Normal rate, regular rhythm and  normal heart sounds.   Pulmonary/Chest: No stridor. No respiratory distress. She has no wheezes.  Abdominal: Soft. Bowel sounds are normal. She exhibits no distension and no mass. There is no tenderness. There is no rebound and no guarding.  Musculoskeletal: She exhibits edema and tenderness.  LLE - knee is swollen L shin is not tender to palp over prox calf  Lymphadenopathy:    She has no cervical adenopathy.  Neurological: She displays normal reflexes. No cranial nerve deficit. She exhibits normal muscle tone. Coordination normal.  Skin: No rash noted. No erythema.  Psychiatric: She has a normal mood and affect. Her behavior is normal. Judgment and thought content normal.   LLE is not swollen   Procedure Note :     Procedure : Joint Injection, L  knee   Indication:  Joint osteoarthritis with refractory  chronic pain.   Risks including unsuccessful procedure , bleeding, infection, bruising, skin atrophy, "steroid flare-up" and others were explained to the patient in detail as well as the benefits. Informed consent was obtained and signed.   Tthe patient was placed in a comfortable position. Lateral approach was used. Skin was prepped with Betadine and alcohol  and anesthetized a cooling spray. Then, a 5 cc syringe with a 1.5 inch long 25-gauge needle was used for a joint injection.. The needle was advanced  Into the knee joint cavity. I aspirated a small amount of intra-articular fluid to confirm correct placement of the needle and injected the joint with 5 mL of 2% lidocaine and 40 mg of Depo-Medrol .  Band-Aid was applied.   Tolerated well. Complications: None. Good pain relief following the  procedure.   Postprocedure instructions :    A Band-Aid should be left on for 12 hours. Injection therapy is not a cure itself. It is used in conjunction with other modalities. You can use nonsteroidal anti-inflammatories like ibuprofen , hot and cold compresses. Rest is recommended in the next 24  hours. You need to report immediately  if fever, chills or any signs of infection develop.      Assessment & Plan:

## 2013-10-25 NOTE — Telephone Encounter (Signed)
Relevant patient education assigned to patient using Emmi. ° °

## 2013-10-25 NOTE — Assessment & Plan Note (Signed)
Continue with current prescription therapy as reflected on the Med list.  

## 2013-10-25 NOTE — Progress Notes (Deleted)
Pre visit review using our clinic review tool, if applicable. No additional management support is needed unless otherwise documented below in the visit note. 

## 2013-10-27 ENCOUNTER — Ambulatory Visit: Payer: Medicare Other | Admitting: Internal Medicine

## 2013-10-28 ENCOUNTER — Encounter: Payer: Self-pay | Admitting: Internal Medicine

## 2013-10-28 MED ORDER — METHYLPREDNISOLONE ACETATE 40 MG/ML IJ SUSP
40.0000 mg | Freq: Once | INTRAMUSCULAR | Status: DC
Start: 2013-10-28 — End: 2014-03-19

## 2013-10-30 ENCOUNTER — Ambulatory Visit: Payer: Medicare Other | Admitting: Internal Medicine

## 2013-11-13 ENCOUNTER — Ambulatory Visit (HOSPITAL_COMMUNITY): Payer: Medicare Other | Attending: Cardiology | Admitting: Cardiology

## 2013-11-13 DIAGNOSIS — M79609 Pain in unspecified limb: Secondary | ICD-10-CM | POA: Diagnosis present

## 2013-11-13 DIAGNOSIS — I10 Essential (primary) hypertension: Secondary | ICD-10-CM | POA: Insufficient documentation

## 2013-11-13 DIAGNOSIS — M25562 Pain in left knee: Secondary | ICD-10-CM

## 2013-11-13 DIAGNOSIS — M7989 Other specified soft tissue disorders: Secondary | ICD-10-CM | POA: Diagnosis not present

## 2013-11-13 NOTE — Progress Notes (Signed)
Unilateral lower venous duplex performed  

## 2013-11-21 ENCOUNTER — Telehealth: Payer: Self-pay | Admitting: Internal Medicine

## 2013-11-21 NOTE — Telephone Encounter (Signed)
Patient is currently out of paroxetine.   Insurance denied dosage for this med and for pantoprazole. They are sending a PA.  Patient wanted to make sure that this got processed.  Thanks!

## 2013-11-23 NOTE — Telephone Encounter (Signed)
Left msg on triage stating they are needing more clinical information on pt medication that extend her limit meaning she is taking more than dosing amount on her paroxetine & paroxetine. Called [employer] spoke with Luciann clinical rep gave clinical information. She stated md will received fax with approval information...Johny Chess

## 2013-11-24 NOTE — Telephone Encounter (Signed)
Received called bck from [facility] both med pantoprazole & paroxetine has been approved. Letters will be mailed to pt & md.../lmb

## 2013-11-27 ENCOUNTER — Encounter: Payer: Self-pay | Admitting: Pulmonary Disease

## 2013-11-27 ENCOUNTER — Ambulatory Visit (INDEPENDENT_AMBULATORY_CARE_PROVIDER_SITE_OTHER): Payer: Medicare Other | Admitting: Pulmonary Disease

## 2013-11-27 VITALS — BP 126/68 | HR 63 | Ht 65.0 in | Wt 146.0 lb

## 2013-11-27 DIAGNOSIS — J189 Pneumonia, unspecified organism: Secondary | ICD-10-CM

## 2013-11-27 NOTE — Patient Instructions (Signed)
Call us as needed if you feel pneumonia symptoms coming on Avoid taking opana prior to bedtime GI consultation if swallowing worse

## 2013-11-27 NOTE — Progress Notes (Signed)
   Subjective:    Patient ID: Bianca Haley, female    DOB: 1951-04-12, 63 y.o.   MRN: [id number]  HPI  11 never smoker first seen for chronic cough x 52mnths on  02/21/13  She was diagnosed with RLL community acquired pneumonia on 01/04/13 & treated with levaquin with clearing of infiltrate on 01/26/13.  Admitted 02/21/13 for recurrent RLL PNA , hypotension. CT chest showed RML/RLL pneumonia and what appeared to be some progression of bronchiectasis (first noted 2011)    Esophagram was negative. IgG,IgaA and IgM levels were low, but not immunosuppressed.  CXR 03/23/13 clearing of infiltrates  06/2013 - outpt Rx for pna   11/27/2013  Chief Complaint  Patient presents with  . Follow-up    Pt has no breathing complaints at this time.  Has changed eating habits, is off xarelto since lv.       She was admitted may 27 through may 29th for recurrent PNA /aspiration pneumonitis . CXR showed R>L consolidation c/w multifocal PNA.  It was felt that along with her esophageal dysmotility /GERD and excessive sedation she is at risk for aspiration.  Her pain meds were adjusted prior to discharge.  A MBSS this admit without dysphagia but raised concerns of possible primary esophageal dysmotility as barium pill stopped mid esophagus requiring additional thin barium to facilitate transit to stomach , PPI to BID prior to discharge.  CXR FU showed near complete resolution of bilateral infiltrates.  Since discharge she is feeling better. No fever. Reports breathing is much improved since discharge.  Denies any dyspnea, wheezing, tightness, congestion. no new complaints.  No overt reflux . She takes opana -last dose by 3pm Only trazodone & neurontin at bedtime   Review of Systems neg for any significant sore throat, dysphagia, itching, sneezing, nasal congestion or excess/ purulent secretions, fever, chills, sweats, unintended wt loss, pleuritic or exertional cp, hempoptysis, orthopnea pnd or change in  chronic leg swelling. Also denies presyncope, palpitations, heartburn, abdominal pain, nausea, vomiting, diarrhea or change in bowel or urinary habits, dysuria,hematuria, rash, arthralgias, visual complaints, headache, numbness weakness or ataxia.     Objective:   Physical Exam  Gen. Pleasant, well-nourished, in no distress ENT - no lesions, no post nasal drip Neck: No JVD, no thyromegaly, no carotid bruits Lungs: no use of accessory muscles, no dullness to percussion, clear without rales or rhonchi  Cardiovascular: Rhythm regular, heart sounds  normal, no murmurs or gallops, no peripheral edema Musculoskeletal: No deformities, no cyanosis or clubbing         Assessment & Plan:

## 2013-11-27 NOTE — Assessment & Plan Note (Signed)
Call us as needed if you feel pneumonia symptoms coming on Avoid taking opana prior to bedtime GI consultation if swallowing worse

## 2013-12-07 ENCOUNTER — Other Ambulatory Visit: Payer: Self-pay

## 2013-12-07 MED ORDER — DILTIAZEM HCL ER BEADS 180 MG PO CP24: 180.0000 mg | ORAL_CAPSULE | Freq: Every day | ORAL | Status: DC

## 2014-01-15 ENCOUNTER — Telehealth: Payer: Self-pay | Admitting: Pulmonary Disease

## 2014-01-15 NOTE — Telephone Encounter (Signed)
Called spoke with pt's daughter Lysbeth Galas.  Per Lysbeth Galas, pt was visiting son in [place] - now in ICU for "double pneumonia that is pretty dense", on the vent.  Lysbeth Galas is requesting records on behalf of physicians in [place] > ov notes, radiology reports, hospital records.  Please fax to [phone], attn ICU.  Lysbeth Galas stated as long as this is completed by end of business day, no call back needed (records were actually faxed at 5pm EST).  Lysbeth Galas stated that if pt's care team in [facility] requests additional information or to speak with RA she will contact the office again.  Records printed (78 pages) and faxed to the number provided by daughter.   Nothing further needed at this time; will sign off.

## 2014-01-19 ENCOUNTER — Ambulatory Visit: Payer: 59 | Admitting: Adult Health

## 2014-01-23 ENCOUNTER — Telehealth: Payer: Self-pay | Admitting: Internal Medicine

## 2014-01-23 NOTE — Telephone Encounter (Signed)
Rec'd from [facility] forward 5 pages to Dr. Dorothy Puffer

## 2014-01-26 ENCOUNTER — Telehealth: Payer: Self-pay | Admitting: Internal Medicine

## 2014-01-26 ENCOUNTER — Ambulatory Visit (INDEPENDENT_AMBULATORY_CARE_PROVIDER_SITE_OTHER): Payer: Medicare Other | Admitting: Adult Health

## 2014-01-26 ENCOUNTER — Encounter: Payer: Self-pay | Admitting: Adult Health

## 2014-01-26 ENCOUNTER — Ambulatory Visit (INDEPENDENT_AMBULATORY_CARE_PROVIDER_SITE_OTHER)
Admission: RE | Admit: 2014-01-26 | Discharge: 2014-01-26 | Disposition: A | Discharge status: Home / Self Care | Payer: Medicare Other | Source: Ambulatory Visit | Attending: Adult Health | Admitting: Adult Health

## 2014-01-26 VITALS — BP 114/66 | HR 62 | Temp 98.4°F | Ht 65.0 in | Wt 140.4 lb

## 2014-01-26 DIAGNOSIS — Z23 Encounter for immunization: Secondary | ICD-10-CM

## 2014-01-26 DIAGNOSIS — J189 Pneumonia, unspecified organism: Secondary | ICD-10-CM

## 2014-01-26 NOTE — Telephone Encounter (Signed)
Received 6 pages from [facility], sent to Dr. Alain Marion. 01/26/14/ss.

## 2014-01-26 NOTE — Progress Notes (Signed)
Subjective:    Patient ID: Bianca Haley, female    DOB: 1950-12-26, 63 y.o.   MRN: [id number]  HPI   59 never smoker first seen for chronic cough x 63mnths on  02/21/13  She was diagnosed with RLL community acquired pneumonia on 01/04/13 & treated with levaquin with clearing of infiltrate on 01/26/13.  Admitted 02/21/13 for recurrent RLL PNA , hypotension. CT chest showed RML/RLL pneumonia and what appeared to be some progression of bronchiectasis (first noted 2011)    Esophagram was negative. IgG,IgaA and IgM levels were low, but not immunosuppressed.  CXR 03/23/13 clearing of infiltrates  06/2013 - outpt Rx for pna  11/27/13   She was admitted may 27 through may 29th for recurrent PNA /aspiration pneumonitis . CXR showed R>L consolidation c/w multifocal PNA.  It was felt that along with her esophageal dysmotility /GERD and excessive sedation she is at risk for aspiration.  Her pain meds were adjusted prior to discharge.  A MBSS this admit without dysphagia but raised concerns of possible primary esophageal dysmotility as barium pill stopped mid esophagus requiring additional thin barium to facilitate transit to stomach , PPI to BID prior to discharge.  CXR FU showed near complete resolution of bilateral infiltrates.  Since discharge she is feeling better. No fever. Reports breathing is much improved since discharge.  Denies any dyspnea, wheezing, tightness, congestion. no new complaints.  No overt reflux . She takes opana -last dose by 3pm Only trazodone & neurontin at bedtime  01/26/2014 [facility] follow up  Patient presents for a post hospital followup. The patient was visiting family out of state in [place], [place], where she was admitted for severe pneumonia. Patient is accompanied by her family member. Patient says she arrived to her son's home and had to sleep on a air mattress. Within 2 days. Patient was having difficulty breathing, and confusion. She went to the  emergency room was found to have severe hypoxic respiratory failure, and bilateral pneumonia. She required vent support. Condition was complicated by septic shock, required pressor support initially. . Patient was treated with IV antibiotics, nebulized bronchodilators. She improved with treatment. CT chest showed no pulmonary embolism. + dense bilateral, lower lobe consolidations, especially along the right middle lobe and left upper lobe. She had mediastinal lymphadenopathy. A 2-D echo showed a normal left ventricular systolic function and ejection fracture 67%. Pulmonary artery pressures were elevated at 43. Patient was extubated, and weaned off of oxygen therapy. Prior to discharge. She was treated with additional 3 days of Levaquin. At discharge. Patient returned home 2 days ago. She says, overall, that she is improved. She denies any hemoptysis, chest pain, orthopnea. She does complain that she is continued to be weak. Had intermittent cough. Patient has cut back on her pain medication and sedating meds.   We discussed importance of prevention of aspiration and use of less sedating rx.  She denies any hemoptysis, orthopnea, PND, leg swelling, rash or diarrhea. Patient was told that she had a possible breast lump. She had a mammogram recently, prior to her trip. Encouraged her to speak with her GYN regarding any further testing that might be needed.   Review of Systems  neg for any significant sore throat, dysphagia, itching, sneezing, nasal congestion or excess/ purulent secretions, fever, chills, sweats, unintended wt loss, pleuritic or exertional cp, hempoptysis, orthopnea pnd or change in chronic leg swelling. Also denies presyncope, palpitations, heartburn, abdominal pain, nausea, vomiting, diarrhea or change in bowel or urinary  habits, dysuria,hematuria, rash, arthralgias, visual complaints, headache, numbness weakness or ataxia.     Objective:   Physical Exam   Gen. Pleasant,  well-nourished, in no distress ENT - no lesions, no post nasal drip Neck: No JVD, no thyromegaly, no carotid bruits Lungs: no use of accessory muscles, no dullness to percussion, clear without rales or rhonchi  Cardiovascular: Rhythm regular, heart sounds  normal, no murmurs or gallops, no peripheral edema Musculoskeletal: No deformities, no cyanosis or clubbing         Assessment & Plan:

## 2014-01-26 NOTE — Assessment & Plan Note (Signed)
Recurrent pneumonia, suspected secondary to aspiration in the setting of chronic narcotic use, and probable primary esophageal dysmotility. Discussed with patient's on decreasing her chronic narcotic use. . Strict  reflux preventive measures CT chest did show bilateral, lower lobe consolidations, consistent with a pneumonia, with mediastinal lymphadenopathy suspect this is reactive. Due to her severe illness and sepsis. However, we'll continue to follow. Chest x-ray serially for clearance. Can  consider if CT chest repeated in the near future. Around 3 months is needed. Will discuss with Dr. Elsworth Soho on return  Plan  Call for first signs of pneumonia -cough, fever, shortness of breath, confusion  Albuterol as needed only for wheezing. GERD diet  Avoid eating 3 hours before bedtime  Do not lie down after eating .  Limit pain medications as much as possible as they make you sleepy and more prone to aspiration.  Follow up Dr. Elsworth Soho  In 3 weeks and As needed   Follow up with GYN regarding recent mammogram and possible breast lump.  Chest xray today .  Please contact office for sooner follow up if symptoms do not improve or worsen or seek emergency care

## 2014-01-26 NOTE — Patient Instructions (Signed)
Call for first signs of pneumonia -cough, fever, shortness of breath, confusion  Albuterol as needed only for wheezing. GERD diet  Avoid eating 3 hours before bedtime  Do not lie down after eating .  Limit pain medications as much as possible as they make you sleepy and more prone to aspiration.  Follow up Dr. Elsworth Soho  In 3 weeks and As needed   Follow up with GYN regarding recent mammogram and possible breast lump.  Chest xray today .  Please contact office for sooner follow up if symptoms do not improve or worsen or seek emergency care

## 2014-02-01 NOTE — Progress Notes (Signed)
Quick Note:  Called spoke with patient, advised of cxr results / recs as stated by TP. Pt verbalized her understanding and denied any questions. ______ 

## 2014-02-07 ENCOUNTER — Encounter: Payer: Self-pay | Admitting: Gastroenterology

## 2014-02-07 ENCOUNTER — Ambulatory Visit (INDEPENDENT_AMBULATORY_CARE_PROVIDER_SITE_OTHER): Payer: Medicare Other | Admitting: Internal Medicine

## 2014-02-07 ENCOUNTER — Encounter: Payer: Self-pay | Admitting: Internal Medicine

## 2014-02-07 VITALS — BP 172/88 | HR 51 | Temp 98.1°F | Resp 16 | Wt 137.6 lb

## 2014-02-07 DIAGNOSIS — J189 Pneumonia, unspecified organism: Secondary | ICD-10-CM

## 2014-02-07 DIAGNOSIS — N183 Chronic kidney disease, stage 3 unspecified: Secondary | ICD-10-CM

## 2014-02-07 DIAGNOSIS — K2100 Gastro-esophageal reflux disease with esophagitis, without bleeding: Secondary | ICD-10-CM

## 2014-02-07 DIAGNOSIS — I10 Essential (primary) hypertension: Secondary | ICD-10-CM

## 2014-02-07 NOTE — Progress Notes (Signed)
   Subjective:    HPI  F/u recent B (?)aspiration pneumonia (4 episodes total) - 3 weeks ago in [place]. Her pneumonia problems onset co-incided with the beginning of Opana use... Pt had 2 swallow tests (nl), denies oversedation w/any of her meds She has an Clinical cytogeneticist  Pulm - Dr Elsworth Soho Chest CT 2014   The patient is here to follow up on chronic depression, anxiety, insomnia    Review of Systems  Constitutional: Negative for chills, activity change, appetite change, fatigue and unexpected weight change.  HENT: Negative for congestion, mouth sores and sinus pressure.   Eyes: Negative for visual disturbance.  Respiratory: Negative for cough and chest tightness.   Gastrointestinal: Negative for nausea and abdominal pain.  Genitourinary: Negative for frequency, difficulty urinating and vaginal pain.  Musculoskeletal: Negative for back pain and gait problem.  Skin: Negative for pallor and rash.  Neurological: Negative for dizziness, tremors, weakness and headaches.  Psychiatric/Behavioral: Negative for suicidal ideas, confusion and sleep disturbance.        Objective:   Physical Exam  Constitutional: She appears well-developed. No distress.  HENT:  Head: Normocephalic.  Right Ear: External ear normal.  Left Ear: External ear normal.  Nose: Nose normal.  Mouth/Throat: Oropharynx is clear and moist.  Eyes: Conjunctivae are normal. Pupils are equal, round, and reactive to light. Right eye exhibits no discharge. Left eye exhibits no discharge.  Neck: Normal range of motion. Neck supple. No JVD present. No tracheal deviation present. No thyromegaly present.  Cardiovascular: Normal rate, regular rhythm and normal heart sounds.   Pulmonary/Chest: No stridor. No respiratory distress. She has no wheezes.  Abdominal: Soft. Bowel sounds are normal. She exhibits no distension and no mass. There is no tenderness. There is no rebound and no guarding.  Musculoskeletal: She exhibits  edema and tenderness.  LLE - knee is swollen L shin is not tender to palp over prox calf  Lymphadenopathy:    She has no cervical adenopathy.  Neurological: She displays normal reflexes. No cranial nerve deficit. She exhibits normal muscle tone. Coordination normal.  Skin: No rash noted. No erythema.  Psychiatric: She has a normal mood and affect. Her behavior is normal. Judgment and thought content normal.   Lab Results  Component Value Date   WBC 8.7 10/13/2013   HGB 12.6 10/13/2013   HCT 37.6 10/13/2013   PLT 300.0 10/13/2013   GLUCOSE 86 10/13/2013   CHOL 196 10/31/2012   TRIG 82.0 10/31/2012   HDL 90.70 10/31/2012   LDLCALC 89 10/31/2012   ALT 15 09/28/2013   AST 26 09/28/2013   NA 139 10/13/2013   K 3.9 10/13/2013   CL 102 10/13/2013   CREATININE 0.7 10/13/2013   BUN 11 10/13/2013   CO2 28 10/13/2013   TSH 0.187* 11/23/2009   INR 1.0 10/13/2013         Assessment & Plan:

## 2014-02-07 NOTE — Assessment & Plan Note (Addendum)
Recent B (?)aspiration pneumonia (4 episodes total) - 3 weeks ago in [place]. Call Pain Clinic - may need t switch from [place] - started Opana prior to her first pneumonia She will call [facility] GI ref Her pneumonia problems onset co-incided with the beginning of Opana use.Marland KitchenMarland KitchenPt will discuss w/Pain Clinic

## 2014-02-09 ENCOUNTER — Telehealth: Payer: Self-pay | Admitting: Adult Health

## 2014-02-09 NOTE — Telephone Encounter (Signed)
I looked at CT and xray done here at last ov showed improved from last cxr in [place].  Please keep follow up ov with Dr. Elsworth Soho  With chest xray  Please contact office for sooner follow up if symptoms do not improve or worsen or seek emergency care

## 2014-02-09 NOTE — Telephone Encounter (Signed)
Had called and spoken with patient on 10.8.15 to check on her Pt reports she is doing well overall, slowly getting strength/stamina back No dyspnea, wheezing, tightness, cough, congestion  Pt did ask if TP would view the images from her [facility] admission and let her "know what she thinks" Advised pt that I will forward request to TP  Disk requested and received from [place] in medical records/xray dept  Will forward message to TP

## 2014-02-09 NOTE — Telephone Encounter (Signed)
Called pt, NA and no VM WCB

## 2014-02-11 NOTE — Assessment & Plan Note (Signed)
Will monitor labs.

## 2014-02-11 NOTE — Assessment & Plan Note (Signed)
Monitor BP at home

## 2014-02-15 NOTE — Telephone Encounter (Signed)
Spoke with pt and advised of Tammy Parrett's advise regarding xray and CT.  Pt to keep f/u with Dr Elsworth Soho.

## 2014-02-19 ENCOUNTER — Telehealth: Payer: Self-pay | Admitting: Pulmonary Disease

## 2014-02-19 ENCOUNTER — Telehealth: Payer: Self-pay | Admitting: Internal Medicine

## 2014-02-19 MED ORDER — AZITHROMYCIN 250 MG PO TABS: ORAL_TABLET | ORAL | Status: DC

## 2014-02-19 NOTE — Telephone Encounter (Signed)
Spoke with pt-- c/o increased cough, head congestion and hoarseness. Pt denies any SOB or chest congestion.  Currently using Mucinex OTC and Delsym for cough.  Requests something be called into pharmacy.  [facility] Allergies  Allergen Reactions  . Penicillins Shortness Of Breath and Rash  . Morphine And Related Swelling    Severe pain Morphine only; other opioids OK   Dr Elsworth Soho please advise. Thanks.

## 2014-02-19 NOTE — Telephone Encounter (Signed)
Please advise in PCPs absence.  

## 2014-02-19 NOTE — Telephone Encounter (Signed)
Patient ask if Dr Lajuana Carry could send something [facility] for her pneumonia. Please advise

## 2014-02-19 NOTE — Telephone Encounter (Signed)
Dr Elsworth Soho addressed this

## 2014-02-19 NOTE — Telephone Encounter (Signed)
z-[id number] 

## 2014-02-19 NOTE — Telephone Encounter (Signed)
Called spoke with pt. Aware of recs. RX sent in. Nothing further needed 

## 2014-02-22 ENCOUNTER — Telehealth: Payer: Self-pay | Admitting: Pulmonary Disease

## 2014-02-22 MED ORDER — DOXYCYCLINE HYCLATE 100 MG PO TABS: ORAL_TABLET | ORAL | Status: DC

## 2014-02-22 NOTE — Telephone Encounter (Signed)
Called and spoke to pt. Informed pt of recs per RA. Rx sent to preferred pharmacy. Pt verbalized understanding and denied any further questions or concerns at this time.

## 2014-02-22 NOTE — Telephone Encounter (Signed)
Called and spoke to pt. Pt states she is getting better since taking ZPAK (last dose is tomorrow). However, she is concerned because she feels as if the "cold is moving to her chest". Pt stated she wants something to "knock it out", and since last dose is tomorrow she doesn't want to get worse over the weekend. Pt c/o "deeper cough" with occasional mucus production--clear in color. Pt is using incentive spirometer and flutter valve daily. Pt denies SOB, f/c/s, CP/tightness. Pt requesting further recs.   RA please advise.   Allergies  Allergen Reactions  . Penicillins Shortness Of Breath and Rash  . Morphine And Related Swelling    Severe pain Morphine only; other opioids OK

## 2014-02-22 NOTE — Telephone Encounter (Signed)
Doxy 100 daily x 5 days - take probiotic daily

## 2014-03-01 ENCOUNTER — Ambulatory Visit: Payer: Medicare Other | Admitting: Family Medicine

## 2014-03-05 ENCOUNTER — Encounter: Payer: Self-pay | Admitting: Internal Medicine

## 2014-03-16 ENCOUNTER — Other Ambulatory Visit: Payer: Self-pay

## 2014-03-19 ENCOUNTER — Encounter: Payer: Self-pay | Admitting: Pulmonary Disease

## 2014-03-19 ENCOUNTER — Ambulatory Visit (INDEPENDENT_AMBULATORY_CARE_PROVIDER_SITE_OTHER): Payer: Medicare Other | Admitting: Pulmonary Disease

## 2014-03-19 ENCOUNTER — Ambulatory Visit (INDEPENDENT_AMBULATORY_CARE_PROVIDER_SITE_OTHER)
Admission: RE | Admit: 2014-03-19 | Discharge: 2014-03-19 | Disposition: A | Discharge status: Home / Self Care | Payer: Medicare Other | Source: Ambulatory Visit | Attending: Pulmonary Disease | Admitting: Pulmonary Disease

## 2014-03-19 DIAGNOSIS — J189 Pneumonia, unspecified organism: Secondary | ICD-10-CM

## 2014-03-19 NOTE — Assessment & Plan Note (Addendum)
CXR today  Again we had a frank discussion about her pain medications-I'm convinced that she is aspirating during sleep, either related to sedative medications or as a result of primary esophageal dysmotility Await GI evaluation Drop neurontin to 2 capsules at night - discuss with your pain physician possible decrease in pain meds, certainly decreasing pain medication seems to be a quality of life issue for her. Would also note recurrent episodes of pneumonia over the past year

## 2014-03-19 NOTE — Progress Notes (Signed)
Subjective:    Patient ID: Bianca Haley, female    DOB: [birth year], 63 y.o.   MRN: [id number]  HPI  44 never smoker first seen for chronic cough x 20mnths on 02/21/13   Significant tests/ events  She was diagnosed with RLL community acquired pneumonia on 01/04/13 & treated with levaquin with clearing of infiltrate on 01/26/13.  Admitted 02/21/13 for recurrent RLL PNA , hypotension. CT chest showed RML/RLL pneumonia and what appeared to be some progression of bronchiectasis (first noted 2011)   Esophagram was negative. IgG,IgaA and IgM levels were low, but not immunosuppressed.   06/2013 - outpt Rx for pna  09/2013  admitted for recurrent PNA /aspiration pneumonitis . CXR showed R>L consolidation c/w multifocal PNA.  A MBSS this admit without dysphagia but raised concerns of possible primary esophageal dysmotility as barium pill stopped mid esophagus requiring additional thin barium to facilitate transit to stomach , PPI to BID prior to discharge.  CXR FU showed near complete resolution of bilateral infiltrates.      03/19/2014  Chief Complaint  Patient presents with  . Follow-up    f/u recurrent pneumonia; still has no stamina; pt currently avoiding people by staying home, using natural remedies     The patient was visiting family out of state in [place], [place], where she was admitted for severe pneumonia. she arrived to her son's home and had to sleep on a air mattress. Within 2 days. Patient was having difficulty breathing, and confusion. She went to the emergency room was found to have severe hypoxic respiratory failure, and bilateral pneumonia. She required vent support. Condition was complicated by septic shock, required pressor support initially. . Patient was treated with IV antibiotics, nebulized bronchodilators. She improved with treatment. CT chest showed no pulmonary embolism. + dense bilateral, lower lobe consolidations, especially along the right middle  lobe and left upper lobe. She had mediastinal lymphadenopathy. A 2-D echo showed a normal left ventricular systolic function and ejection fracture 67%. Pulmonary artery pressures were elevated at 43. Patient was extubated, and weaned off of oxygen therapy. Prior to discharge. She was treated with additional 3 days of LevaquinPatient was told that she had a possible breast lump. She had a mammogram recently, prior to her trip. Encouraged her to speak with her GYN regarding any further testing that might be needed.  She takes opana -last dose by 4pm She takes trazodone 100 mg& neurontin 900 mg at bedtime She also takes Valium as a muscle relaxant and Xanax as needed for anxiety Denies any dyspnea, wheezing, tightness, congestion. no new complaints.  No overt reflux .   Chest x-ray showed right lower lobe infiltrate   Review of Systems neg for any significant sore throat, dysphagia, itching, sneezing, nasal congestion or excess/ purulent secretions, fever, chills, sweats, unintended wt loss, pleuritic or exertional cp, hempoptysis, orthopnea pnd or change in chronic leg swelling. Also denies presyncope, palpitations, heartburn, abdominal pain, nausea, vomiting, diarrhea or change in bowel or urinary habits, dysuria,hematuria, rash, arthralgias, visual complaints, headache, numbness weakness or ataxia.     Objective:   Physical Exam  Gen. Pleasant, well-nourished, in no distress ENT - no lesions, no post nasal drip Neck: No JVD, no thyromegaly, no carotid bruits Lungs: no use of accessory muscles, no dullness to percussion, clear without rales or rhonchi  Cardiovascular: Rhythm regular, heart sounds  normal, no murmurs or gallops, no peripheral edema Musculoskeletal: No deformities, no cyanosis or clubbing  Assessment & Plan:

## 2014-03-19 NOTE — Patient Instructions (Signed)
CXR today  Await GI evaluation Drop neurontin to 2 capsules at night - discuss with your pain physician possible decrease in pain meds

## 2014-03-20 ENCOUNTER — Telehealth: Payer: Self-pay | Admitting: Pulmonary Disease

## 2014-03-20 NOTE — Telephone Encounter (Signed)
Called and spoke with pt and she is aware of cxr results per RA.  Pt voiced her understanding and nothing further is needed.

## 2014-04-02 DIAGNOSIS — M5412 Radiculopathy, cervical region: Secondary | ICD-10-CM | POA: Insufficient documentation

## 2014-04-08 ENCOUNTER — Other Ambulatory Visit: Payer: Self-pay | Admitting: Internal Medicine

## 2014-04-09 ENCOUNTER — Encounter: Payer: Self-pay | Admitting: Gastroenterology

## 2014-04-09 ENCOUNTER — Ambulatory Visit (INDEPENDENT_AMBULATORY_CARE_PROVIDER_SITE_OTHER): Payer: Medicare Other | Admitting: Gastroenterology

## 2014-04-09 VITALS — BP 142/68 | Ht 65.0 in | Wt 140.1 lb

## 2014-04-09 DIAGNOSIS — R933 Abnormal findings on diagnostic imaging of other parts of digestive tract: Secondary | ICD-10-CM

## 2014-04-09 DIAGNOSIS — Z860101 Personal history of adenomatous and serrated colon polyps: Secondary | ICD-10-CM

## 2014-04-09 DIAGNOSIS — K219 Gastro-esophageal reflux disease without esophagitis: Secondary | ICD-10-CM

## 2014-04-09 DIAGNOSIS — Z86010 Personal history of colonic polyps: Secondary | ICD-10-CM

## 2014-04-09 NOTE — Progress Notes (Signed)
    History of Present Illness: This is a 63 year old female who has had 4 episodes of pneumonia over the past year. She has had chronic GERD which has been controlled for years on pantoprazole 40 mg daily. This was recently increased to twice daily. She relates that she has has been followed by Dr. Earlie Raveling for history of adenomatous colon polyps and she underwent colonoscopy in 2009. She received a five-year recall letter from him earlier this year but has not returned for colonoscopy. She had an abnormal MBSS and a normal BA esophagram as listed below. Denies weight loss, abdominal pain, constipation, diarrhea, change in stool caliber, melena, hematochezia, nausea, vomiting, dysphagia, chest pain.  BA esophagram 02/2013  IMPRESSION: 1. Normal exam.  MBSS in 09/2013 Dysphagia Diagnosis: Suspected primary esophageal dysphagia Clinical impression: Pt.'s oral and pharyngeal phases of swallow were within normal limits with timely swallow initiation, adequate laryngeal elevation and anterior excursion. No penetration or aspiration observed. Suspect a primary esophageal dyshagia as barium pill stopped mid esophagus requiring additional thin barium to facilitate transit to stomach (no radiologist present and SLP does not diagnose deficits below level of UES). Pt. exhibited frequent throat clearing without evidence of barium entering laryngeal vestibule.  Review of Systems: Pertinent positive and negative review of systems were noted in the above HPI section. All other review of systems were otherwise negative.  Current Medications, Allergies, Past Medical History, Past Surgical History, Family History and Social History were reviewed in [employer] record.  Physical Exam: General: Well developed , well nourished, no acute distress Head: Normocephalic and atraumatic Eyes:  sclerae anicteric, EOMI Ears: Normal auditory acuity Mouth: No deformity or lesions Neck: Supple, no  masses or thyromegaly Lungs: Clear throughout to auscultation Heart: Regular rate and rhythm; no murmurs, rubs or bruits Abdomen: Soft, non tender and non distended. No masses, hepatosplenomegaly or hernias noted. Normal Bowel sounds Rectal: deferred to colonoscopy Musculoskeletal: Symmetrical with no gross deformities  Skin: No lesions on visible extremities Pulses:  Normal pulses noted Extremities: No clubbing, cyanosis, edema or deformities noted Neurological: Alert oriented x 4, grossly nonfocal Cervical Nodes:  No significant cervical adenopathy Inguinal Nodes: No significant inguinal adenopathy Psychological:  Alert and cooperative. Normal mood and affect  Assessment and Recommendations:  1. Abnormal MBSS and a normal barium esophagram. Chronic GERD. Recurrent PNA. Continue pantoprazole 40 mg twice daily and all standard antireflux measures including elevation of the head of the bed. Schedule EGD. The risks, benefits, and alternatives to endoscopy with possible biopsy and possible dilation were discussed with the patient and they consent to proceed.   2. Personal history of adenomatous colon polyps. Request records from Dr. Earlean Shawl. The risks, benefits, and alternatives to colonoscopy with possible biopsy and possible polypectomy were discussed with the patient and they consent to proceed.

## 2014-04-09 NOTE — Patient Instructions (Signed)
You have been scheduled for an endoscopy. Please follow written instructions given to you at your visit today. If you use inhalers (even only as needed), please bring them with you on the day of your procedure. Your physician has requested that you go to www.startemmi.com and enter the access code given to you at your visit today. This web site gives a general overview about your procedure. However, you should still follow specific instructions given to you by our office regarding your preparation for the procedure.  You have been scheduled for a colonoscopy. Please follow written instructions given to you at your visit today.  Please pick up your prep kit at the pharmacy within the next 1-3 days. If you use inhalers (even only as needed), please bring them with you on the day of your procedure. Your physician has requested that you go to www.startemmi.com and enter the access code given to you at your visit today. This web site gives a general overview about your procedure. However, you should still follow specific instructions given to you by our office regarding your preparation for the procedure. 

## 2014-04-10 ENCOUNTER — Ambulatory Visit (AMBULATORY_SURGERY_CENTER): Payer: Medicare Other | Admitting: Gastroenterology

## 2014-04-10 ENCOUNTER — Encounter: Payer: Self-pay | Admitting: Gastroenterology

## 2014-04-10 VITALS — BP 151/72 | HR 65 | Temp 98.4°F | Resp 19 | Ht 65.0 in | Wt 140.0 lb

## 2014-04-10 DIAGNOSIS — K2950 Unspecified chronic gastritis without bleeding: Secondary | ICD-10-CM

## 2014-04-10 DIAGNOSIS — K219 Gastro-esophageal reflux disease without esophagitis: Secondary | ICD-10-CM

## 2014-04-10 DIAGNOSIS — K2970 Gastritis, unspecified, without bleeding: Secondary | ICD-10-CM

## 2014-04-10 DIAGNOSIS — K2100 Gastro-esophageal reflux disease with esophagitis, without bleeding: Secondary | ICD-10-CM

## 2014-04-10 DIAGNOSIS — R933 Abnormal findings on diagnostic imaging of other parts of digestive tract: Secondary | ICD-10-CM

## 2014-04-10 MED ORDER — SODIUM CHLORIDE 0.9 % IV SOLN: 500.0000 mL | INTRAVENOUS | Status: DC

## 2014-04-10 NOTE — Progress Notes (Signed)
Report to PACU, RN, vss, BBS= Clear.  

## 2014-04-10 NOTE — Progress Notes (Signed)
Called to room to assist during endoscopic procedure.  Patient ID and intended procedure confirmed with present staff. Received instructions for my participation in the procedure from the performing physician.  

## 2014-04-10 NOTE — Patient Instructions (Signed)
CONTINUE YOUR PPI, PROTONIX, TWICE DAILY.    YOU HAD AN ENDOSCOPIC PROCEDURE TODAY AT [facility]: Refer to the procedure report that was given to you for any specific questions about what was found during the examination.  If the procedure report does not answer your questions, please call your gastroenterologist to clarify.  If you requested that your care partner not be given the details of your procedure findings, then the procedure report has been included in a sealed envelope for you to review at your convenience later.  YOU SHOULD EXPECT: Some feelings of bloating in the abdomen. Passage of more gas than usual.  Walking can help get rid of the air that was put into your GI tract during the procedure and reduce the bloating. If you had a lower endoscopy (such as a colonoscopy or flexible sigmoidoscopy) you may notice spotting of blood in your stool or on the toilet paper. If you underwent a bowel prep for your procedure, then you may not have a normal bowel movement for a few days.  DIET: Your first meal following the procedure should be a light meal and then it is ok to progress to your normal diet.  A half-sandwich or bowl of soup is an example of a good first meal.  Heavy or fried foods are harder to digest and may make you feel nauseous or bloated.  Likewise meals heavy in dairy and vegetables can cause extra gas to form and this can also increase the bloating.  Drink plenty of fluids but you should avoid alcoholic beverages for 24 hours.  ACTIVITY: Your care partner should take you home directly after the procedure.  You should plan to take it easy, moving slowly for the rest of the day.  You can resume normal activity the day after the procedure however you should NOT DRIVE or use heavy machinery for 24 hours (because of the sedation medicines used during the test).    SYMPTOMS TO REPORT IMMEDIATELY: A gastroenterologist can be reached at any hour.  During normal business  hours, 8:30 AM to 5:00 PM Monday through Friday, call [phone].  After hours and on weekends, please call the GI answering service at [phone] who will take a message and have the physician on call contact you.    Following upper endoscopy (EGD)  Vomiting of blood or coffee ground material  New chest pain or pain under the shoulder blades  Painful or persistently difficult swallowing  New shortness of breath  Fever of 100F or higher  Black, tarry-looking stools  FOLLOW UP: If any biopsies were taken you will be contacted by phone or by letter within the next 1-3 weeks.  Call your gastroenterologist if you have not heard about the biopsies in 3 weeks.  Our staff will call the home number listed on your records the next business day following your procedure to check on you and address any questions or concerns that you may have at that time regarding the information given to you following your procedure. This is a courtesy call and so if there is no answer at the home number and we have not heard from you through the emergency physician on call, we will assume that you have returned to your regular daily activities without incident.  SIGNATURES/CONFIDENTIALITY: You and/or your care partner have signed paperwork which will be entered into your electronic medical record.  These signatures attest to the fact that that the information above on your After Visit  Summary has been reviewed and is understood.  Full responsibility of the confidentiality of this discharge information lies with you and/or your care-partner. 

## 2014-04-10 NOTE — Op Note (Addendum)
[place]  [address] [place], [address]   ENDOSCOPY PROCEDURE REPORT  PATIENT: Bianca, Haley  MR#: [id number] BIRTHDATE: [birth year] , 66  yrs. old GENDER: female ENDOSCOPIST: Ladene Artist, MD, [facility] REFERRED BY:  Rigoberto Noel, M.D. PROCEDURE DATE:  04/10/2014 PROCEDURE:  EGD w/ biopsy ASA CLASS:     Class II INDICATIONS:  history of esophageal reflux. MEDICATIONS: Monitored anesthesia care and Propofol 150 mg IV TOPICAL ANESTHETIC: none DESCRIPTION OF PROCEDURE: After the risks benefits and alternatives of the procedure were thoroughly explained, informed consent was obtained.  The LB [id number] endoscope was introduced through the mouth and advanced to the second portion of the duodenum , Without limitations.  The instrument was slowly withdrawn as the mucosa was fully examined.    ESOPHAGUS: There was LA Class A esophagitis (One or more mucosal breaks < 5 mm in maximal length) noted.   The esophagus was otherwise normal. STOMACH: Moderate gastritis was found in the prepyloric region of the stomach.  Multiple biopsies were performed.   The stomach otherwise appeared normal. DUODENUM: The duodenal mucosa showed no abnormalities in the bulb and 2nd part of the duodenum.  Retroflexed views revealed a small hiatal hernia.     The scope was then withdrawn from the patient and the procedure completed.  COMPLICATIONS: There were no immediate complications.  ENDOSCOPIC IMPRESSION: 1.   LA Class A esophagitis 2.   Small hiatal hernai 3.   Gastritis in the prepyloric region of the stomach; multiple biopsies performed  RECOMMENDATIONS: 1.  Anti-reflux regimen long term 2.  Await pathology results 3.  Continue PPI bid  eSigned:  Ladene Artist, MD, [facility] 04/10/2014 3:22 PM

## 2014-04-11 ENCOUNTER — Telehealth: Payer: Self-pay | Admitting: *Deleted

## 2014-04-11 NOTE — Telephone Encounter (Signed)
  Follow up Call-  Call back number 04/10/2014  Post procedure Call Back phone  # [phone]  Permission to leave phone message Yes    Number disconnected; unable to leave message

## 2014-04-12 ENCOUNTER — Ambulatory Visit (AMBULATORY_SURGERY_CENTER): Payer: Medicare Other | Admitting: Gastroenterology

## 2014-04-12 ENCOUNTER — Encounter: Payer: Self-pay | Admitting: Gastroenterology

## 2014-04-12 VITALS — BP 140/72 | HR 56 | Temp 98.5°F | Resp 13 | Ht 65.0 in | Wt 140.0 lb

## 2014-04-12 DIAGNOSIS — Z86010 Personal history of colonic polyps: Secondary | ICD-10-CM

## 2014-04-12 DIAGNOSIS — D125 Benign neoplasm of sigmoid colon: Secondary | ICD-10-CM

## 2014-04-12 MED ORDER — SODIUM CHLORIDE 0.9 % IV SOLN: 500.0000 mL | INTRAVENOUS | Status: DC

## 2014-04-12 NOTE — Patient Instructions (Signed)
YOU HAD AN ENDOSCOPIC PROCEDURE TODAY AT THE [facility]: Refer to the procedure report that was given to you for any specific questions about what was found during the examination.  If the procedure report does not answer your questions, please call your gastroenterologist to clarify.  If you requested that your care partner not be given the details of your procedure findings, then the procedure report has been included in a sealed envelope for you to review at your convenience later.  YOU SHOULD EXPECT: Some feelings of bloating in the abdomen. Passage of more gas than usual.  Walking can help get rid of the air that was put into your GI tract during the procedure and reduce the bloating. If you had a lower endoscopy (such as a colonoscopy or flexible sigmoidoscopy) you may notice spotting of blood in your stool or on the toilet paper. If you underwent a bowel prep for your procedure, then you may not have a normal bowel movement for a few days.  DIET: Your first meal following the procedure should be a light meal and then it is ok to progress to your normal diet.  A half-sandwich or bowl of soup is an example of a good first meal.  Heavy or fried foods are harder to digest and may make you feel nauseous or bloated.  Likewise meals heavy in dairy and vegetables can cause extra gas to form and this can also increase the bloating.  Drink plenty of fluids but you should avoid alcoholic beverages for 24 hours.  ACTIVITY: Your care partner should take you home directly after the procedure.  You should plan to take it easy, moving slowly for the rest of the day.  You can resume normal activity the day after the procedure however you should NOT DRIVE or use heavy machinery for 24 hours (because of the sedation medicines used during the test).    SYMPTOMS TO REPORT IMMEDIATELY: A gastroenterologist can be reached at any hour.  During normal business hours, 8:30 AM to 5:00 PM Monday through Friday,  call [phone].  After hours and on weekends, please call the GI answering service at [phone] who will take a message and have the physician on call contact you.   Following lower endoscopy (colonoscopy or flexible sigmoidoscopy):  Excessive amounts of blood in the stool  Significant tenderness or worsening of abdominal pains  Swelling of the abdomen that is new, acute  Fever of 100F or higher  FOLLOW UP: If any biopsies were taken you will be contacted by phone or by letter within the next 1-3 weeks.  Call your gastroenterologist if you have not heard about the biopsies in 3 weeks.  Our staff will call the home number listed on your records the next business day following your procedure to check on you and address any questions or concerns that you may have at that time regarding the information given to you following your procedure. This is a courtesy call and so if there is no answer at the home number and we have not heard from you through the emergency physician on call, we will assume that you have returned to your regular daily activities without incident.  SIGNATURES/CONFIDENTIALITY: You and/or your care partner have signed paperwork which will be entered into your electronic medical record.  These signatures attest to the fact that that the information above on your After Visit Summary has been reviewed and is understood.  Full responsibility of the confidentiality of this   discharge information lies with you and/or your care-partner.   Resume medications. Information given on polyps, diverticulosis, hemorrhoids and high fiber diet. 

## 2014-04-12 NOTE — Op Note (Signed)
[place]  [address] [place], [address]   COLONOSCOPY PROCEDURE REPORT  PATIENT: Bianca, Haley  MR#: [id number] BIRTHDATE: 07-31-1950 , 43  yrs. old GENDER: female ENDOSCOPIST: Ladene Artist, MD, [facility] REFERRED QP:YPPJ Avel Sensor, M.D. PROCEDURE DATE:  04/12/2014 PROCEDURE:   Colonoscopy with snare polypectomy First Screening Colonoscopy - Avg.  risk and is 50 yrs.  old or older - No.  Prior Negative Screening - Now for repeat screening. N/A  History of Adenoma - Now for follow-up colonoscopy & has been > or = to 3 yrs.  Yes hx of adenoma.  Has been 3 or more years since last colonoscopy.  Polyps Removed Today? Yes. ASA CLASS:   Class III INDICATIONS:surveillance colonoscopy based on a history of adenomatous colonic polyp(s). MEDICATIONS: Monitored anesthesia care and Propofol 400 mg IV DESCRIPTION OF PROCEDURE:   After the risks benefits and alternatives of the procedure were thoroughly explained, informed consent was obtained.  The digital rectal exam revealed no abnormalities of the rectum.   The LB [id number]  endoscope was introduced through the anus and advanced to the cecum, which was identified by both the appendix and ileocecal valve. No adverse events experienced with a tortuous colon.   The quality of the prep was adequate, using MoviPrep  The instrument was then slowly withdrawn as the colon was fully examined.  COLON FINDINGS: There was mild diverticulosis noted in the transverse colon and ascending colon.   A sessile polyp measuring 6 mm in size was found in the sigmoid colon.  A polypectomy was performed with a cold snare.  The resection was complete, the polyp tissue was completely retrieved and sent to histology.   The examination was otherwise normal.  Retroflexed views revealed internal Grade I hemorrhoids. The time to cecum=8 minutes 37 seconds.  Withdrawal time=10 minutes 35 seconds.  The scope was withdrawn and the  procedure completed. COMPLICATIONS: There were no immediate complications.  ENDOSCOPIC IMPRESSION: 1.   Mild diverticulosis in the transverse colon and ascending colon  2.   Sessile polyp in the sigmoid colon; polypectomy performed with a cold snare 3.   Grade l internal hemorrhoids  RECOMMENDATIONS: 1.  Await pathology results 2.  High fiber diet with liberal fluid intake 3.  Repeat Colonoscopy in 5 years with a more extensive bowel prep  eSigned:  Ladene Artist, MD, [facility] 2014-04-12 24:58:09.983

## 2014-04-12 NOTE — Progress Notes (Signed)
Patient awakening,vss,report to rn 

## 2014-04-12 NOTE — Progress Notes (Signed)
Called to room to assist during endoscopic procedure.  Patient ID and intended procedure confirmed with present staff. Received instructions for my participation in the procedure from the performing physician.  

## 2014-04-13 ENCOUNTER — Telehealth: Payer: Self-pay | Admitting: *Deleted

## 2014-04-13 NOTE — Telephone Encounter (Signed)
  Follow up Call-  Call back number 04/12/2014 04/10/2014  Post procedure Call Back phone  # [phone]  Permission to leave phone message Yes Yes     Patient questions:  Do you have a fever, pain , or abdominal swelling? No. Pain Score  0 *  Have you tolerated food without any problems? Yes.    Have you been able to return to your normal activities? No.  Do you have any questions about your discharge instructions: Diet   No. Medications  No. Follow up visit  No.  Do you have questions or concerns about your Care? No.  Actions: * If pain score is 4 or above: No action needed, pain <4.

## 2014-04-17 ENCOUNTER — Encounter: Payer: Self-pay | Admitting: Gastroenterology

## 2014-04-23 ENCOUNTER — Ambulatory Visit (INDEPENDENT_AMBULATORY_CARE_PROVIDER_SITE_OTHER): Payer: Medicare Other | Admitting: Family

## 2014-04-23 ENCOUNTER — Encounter: Payer: Self-pay | Admitting: Family

## 2014-04-23 VITALS — BP 160/82 | HR 53 | Temp 97.9°F | Resp 18 | Ht 65.0 in | Wt 140.8 lb

## 2014-04-23 DIAGNOSIS — R600 Localized edema: Secondary | ICD-10-CM | POA: Insufficient documentation

## 2014-04-23 DIAGNOSIS — M1712 Unilateral primary osteoarthritis, left knee: Secondary | ICD-10-CM

## 2014-04-23 MED ORDER — HYDROCHLOROTHIAZIDE 12.5 MG PO CAPS: 12.5000 mg | ORAL_CAPSULE | Freq: Every day | ORAL | Status: DC

## 2014-04-23 NOTE — Progress Notes (Signed)
Pre visit review using our clinic review tool, if applicable. No additional management support is needed unless otherwise documented below in the visit note. 

## 2014-04-23 NOTE — Assessment & Plan Note (Signed)
Lower extremity edema noted bilaterally. Start hydrochlorothiazide 12.5 mg daily. May take up to 25 mg as needed for edema. Advised to drink plenty of fluids, decrease salt intake, and wear compression socks as needed. Also advised keep her legs elevated as much as possible. Follow up if symptoms worsen or fail to improve.

## 2014-04-23 NOTE — Progress Notes (Signed)
Subjective:    Patient ID: Bianca Haley, female    DOB: [birth year], 63 y.o.   MRN: [id number]  Chief Complaint  Patient presents with  . Leg Pain    Leg swelling that gets worse throughout the day x4 days, red spots that are occuring on legs as well, Says knees are also having an pain, x3 days    HPI:  Bianca Haley is a 63 y.o. female who presents today for an acute visit.   Acute symptoms of leg swelling has been noted for approximately 3 days and is worsened aggressively throughout the day. She also notes that she has red spots that are occurring on her legs. Has a bed that she uses to elevate her legs and her leg swelling is seems to be better in the morning.   Indicates that she has had swelling in her leg that she was seen previously for. States that her knees are aching at night and is having difficulty going up and down stairs. Also notes that the knee achiness woke her up in the middle of the night. For this she has taken Aleve which seems to help.   Allergies  Allergen Reactions  . Penicillins Shortness Of Breath and Rash  . Morphine And Related Swelling    Severe pain Morphine only; other opioids OK   Current Outpatient Prescriptions on File Prior to Visit  Medication Sig Dispense Refill  . albuterol (PROVENTIL HFA;VENTOLIN HFA) 108 (90 BASE) MCG/ACT inhaler 2 puffs twice daily for 5 days, then q6h PRN cough/wheeze 1 Inhaler 0  . ALPRAZolam (XANAX) 0.25 MG tablet We recommend using 1/2 or your 0.5 mg tabs every 6 hours as needed for anxiety 30 tablet 0  . ASTRAGALUS PO Take 3 tablets by mouth 2 (two) times daily.    [name] atenolol (TENORMIN) 100 MG tablet Take 100 mg by mouth daily.    . cholecalciferol (VITAMIN D) 1000 UNITS tablet Take 1 tablet (1,000 Units total) by mouth daily. 100 tablet 3  . diazepam (VALIUM) 5 MG tablet Take 1 tablet by mouth 2 (two) times daily as needed.    . diltiazem (TIAZAC) 180 MG 24 hr capsule Take 1 capsule (180 mg total) by mouth daily.  180 capsule 3  . gabapentin (NEURONTIN) 300 MG capsule Take 3 capsules by mouth at bedtime.    . Lactobacillus-Inulin (CULTURELLE DIGESTIVE HEALTH PO) Take 1 tablet by mouth daily.    . Misc Natural Products (ESBERITOX PO) Take 2 tablets by mouth 2 (two) times daily.     [name] oxymorphone (OPANA) 10 MG tablet Take 10 mg by mouth 2 (two) times daily. Take 1/2 of your 10 mg tab twice daily as needed for pain-if unable to break tab in half due to extended release formulation recommend cautious use of this medication at same (10 mg) dosage- follow up with your pain clinic to discuss changes in medication dosages    . pantoprazole (PROTONIX) 40 MG tablet Take 1 tablet (40 mg total) by mouth 2 (two) times daily. 60 tablet 11  . PARoxetine (PAXIL) 40 MG tablet Take 1 tablet (40 mg total) by mouth 2 (two) times daily. 60 tablet 5  . simvastatin (ZOCOR) 40 MG tablet Take 20 mg by mouth daily.    . SUMAtriptan (IMITREX) 100 MG tablet Take 1 tablet (100 mg total) by mouth once. May repeat in 2 hours if headache persists or recurs. 12 tablet 5  . traZODone (DESYREL) 100 MG tablet TAKE 1  TABLET BY MOUTH AT BEDTIME 30 tablet 5  . [DISCONTINUED] diltiazem (CARDIZEM CD) 180 MG 24 hr capsule TAKE ONE CAPSULE EVERY DAY 30 capsule 5   No current facility-administered medications on file prior to visit.     Review of Systems    See HPI  Objective:    BP 160/82 mmHg  Pulse 53  Temp(Src) 97.9 F (36.6 C) (Oral)  Resp 18  Ht 5\' 5"  (1.651 m)  Wt 140 lb 12.8 oz (63.866 kg)  BMI 23.43 kg/m2  SpO2 91% Nursing note and vital signs reviewed.  Physical Exam  Constitutional: She is oriented to person, place, and time. She appears well-developed and well-nourished. No distress.  Cardiovascular: Normal rate, regular rhythm, normal heart sounds and intact distal pulses.   1-2+ pitting edema noted bilateral lower extremities.  Pulmonary/Chest: Effort normal and breath sounds normal.  Musculoskeletal:  No obvious  deformity or discoloration of bilateral knees noted. Mild increase in temperature noted to the left knee. Range of motion is intact and appropriate. Ligamentous testing is negative.  Neurological: She is alert and oriented to person, place, and time.  Skin: Skin is warm and dry.  Psychiatric: She has a normal mood and affect. Her behavior is normal. Judgment and thought content normal.       Assessment & Plan:

## 2014-04-23 NOTE — Assessment & Plan Note (Signed)
Symptoms and exam remain consistent with osteoarthritis of the left knee. Continue Aleve as needed for pain. Recommend alternating ice and heat as needed for symptom relief. Continue mobility and exercise to assist with pain. Follow up if symptoms worsen or fail to improve.

## 2014-04-23 NOTE — Patient Instructions (Signed)
Thank you for choosing [facility].  Summary/Instructions:  Your prescription(s) have been submitted to your pharmacy. Please take as directed and contact our office if you believe you are having problem(s) with the medication(s).  If your symptoms worsen or fail to improve, please contact our office for further instruction, or in case of emergency go directly to the emergency room at the closest medical facility.   Please start taking the Hydrochlorothiazide daily. On days of increased swelling please take 2 as needed.  For your legs please use Dove, Aveeno, or Eucerin products for moisturizing.   May also consider compression socks to help with swelling.  Drink plenty of fluids and decrease salt intake. Please keep your feet elevated as much as possible.  For your knees - continue to take Aleve as needed for pain. Also recommended to use ice/heat for 20 minutes 2-3 times daily as needed.   Edema Edema is an abnormal buildup of fluids in your bodytissues. Edema is somewhatdependent on gravity to pull the fluid to the lowest place in your body. That makes the condition more common in the legs and thighs (lower extremities). Painless swelling of the feet and ankles is common and becomes more likely as you get older. It is also common in looser tissues, like around your eyes.  When the affected area is squeezed, the fluid may move out of that spot and leave a dent for a few moments. This dent is called pitting.  CAUSES  There are many possible causes of edema. Eating too much salt and being on your feet or sitting for a long time can cause edema in your legs and ankles. Hot weather may make edema worse. Common medical causes of edema include:  Heart failure.  Liver disease.  Kidney disease.  Weak blood vessels in your legs.  Cancer.  An injury.  Pregnancy.  Some medications.  Obesity. SYMPTOMS  Edema is usually painless.Your skin may look swollen or shiny.   DIAGNOSIS  Your health care provider may be able to diagnose edema by asking about your medical history and doing a physical exam. You may need to have tests such as X-rays, an electrocardiogram, or blood tests to check for medical conditions that may cause edema.  TREATMENT  Edema treatment depends on the cause. If you have heart, liver, or kidney disease, you need the treatment appropriate for these conditions. General treatment may include:  Elevation of the affected body part above the level of your heart.  Compression of the affected body part. Pressure from elastic bandages or support stockings squeezes the tissues and forces fluid back into the blood vessels. This keeps fluid from entering the tissues.  Restriction of fluid and salt intake.  Use of a water pill (diuretic). These medications are appropriate only for some types of edema. They pull fluid out of your body and make you urinate more often. This gets rid of fluid and reduces swelling, but diuretics can have side effects. Only use diuretics as directed by your health care provider. HOME CARE INSTRUCTIONS   Keep the affected body part above the level of your heart when you are lying down.   Do not sit still or stand for prolonged periods.   Do not put anything directly under your knees when lying down.  Do not wear constricting clothing or garters on your upper legs.   Exercise your legs to work the fluid back into your blood vessels. This may help the swelling go down.   Wear  elastic bandages or support stockings to reduce ankle swelling as directed by your health care provider.   Eat a low-salt diet to reduce fluid if your health care provider recommends it.   Only take medicines as directed by your health care provider. SEEK MEDICAL CARE IF:   Your edema is not responding to treatment.  You have heart, liver, or kidney disease and notice symptoms of edema.  You have edema in your legs that does not  improve after elevating them.   You have sudden and unexplained weight gain. SEEK IMMEDIATE MEDICAL CARE IF:   You develop shortness of breath or chest pain.   You cannot breathe when you lie down.  You develop pain, redness, or warmth in the swollen areas.   You have heart, liver, or kidney disease and suddenly get edema.  You have a fever and your symptoms suddenly get worse. MAKE SURE YOU:   Understand these instructions.  Will watch your condition.  Will get help right away if you are not doing well or get worse. Document Released: 04/20/2005 Document Revised: 09/04/2013 Document Reviewed: 02/10/2013 [facility] Patient Information 2015 [place], [place]. This information is not intended to replace advice given to you by your health care provider. Make sure you discuss any questions you have with your health care provider.

## 2014-05-03 ENCOUNTER — Telehealth: Payer: Self-pay | Admitting: Pulmonary Disease

## 2014-05-03 ENCOUNTER — Other Ambulatory Visit: Payer: Self-pay | Admitting: Internal Medicine

## 2014-05-03 MED ORDER — DOXYCYCLINE HYCLATE 100 MG PO TABS: 100.0000 mg | ORAL_TABLET | Freq: Every day | ORAL | Status: DC

## 2014-05-03 NOTE — Telephone Encounter (Signed)
Called and spoke with pt and she is aware of RA recs.  This has been called to her pharmacy and nothing further is needed.

## 2014-05-03 NOTE — Telephone Encounter (Signed)
Called and spoke with pt and she stated that she has been coughing x 2 days.  She has had PNA 4 times in the last year.  She stated that her chest feels tight and she has used zyrtec and mucinex without relief.  Pt is requesting that abx be called in for her.  RA please advise. Thanks  Allergies  Allergen Reactions  . Penicillins Shortness Of Breath and Rash  . Morphine And Related Swelling    Severe pain Morphine only; other opioids OK    Current Outpatient Prescriptions on File Prior to Visit  Medication Sig Dispense Refill  . albuterol (PROVENTIL HFA;VENTOLIN HFA) 108 (90 BASE) MCG/ACT inhaler 2 puffs twice daily for 5 days, then q6h PRN cough/wheeze 1 Inhaler 0  . ALPRAZolam (XANAX) 0.25 MG tablet We recommend using 1/2 or your 0.5 mg tabs every 6 hours as needed for anxiety 30 tablet 0  . ASTRAGALUS PO Take 3 tablets by mouth 2 (two) times daily.    [name] atenolol (TENORMIN) 100 MG tablet Take 100 mg by mouth daily.    . cholecalciferol (VITAMIN D) 1000 UNITS tablet Take 1 tablet (1,000 Units total) by mouth daily. 100 tablet 3  . diazepam (VALIUM) 5 MG tablet Take 1 tablet by mouth 2 (two) times daily as needed.    . diltiazem (TIAZAC) 180 MG 24 hr capsule Take 1 capsule (180 mg total) by mouth daily. 180 capsule 3  . gabapentin (NEURONTIN) 300 MG capsule Take 3 capsules by mouth at bedtime.    . hydrochlorothiazide (MICROZIDE) 12.5 MG capsule Take 1 capsule (12.5 mg total) by mouth daily. 90 capsule 0  . Lactobacillus-Inulin (CULTURELLE DIGESTIVE HEALTH PO) Take 1 tablet by mouth daily.    . Misc Natural Products (ESBERITOX PO) Take 2 tablets by mouth 2 (two) times daily.     [name] oxymorphone (OPANA) 10 MG tablet Take 10 mg by mouth 2 (two) times daily. Take 1/2 of your 10 mg tab twice daily as needed for pain-if unable to break tab in half due to extended release formulation recommend cautious use of this medication at same (10 mg) dosage- follow up with your pain clinic to discuss changes in  medication dosages    . pantoprazole (PROTONIX) 40 MG tablet Take 1 tablet (40 mg total) by mouth 2 (two) times daily. 60 tablet 11  . PARoxetine (PAXIL) 40 MG tablet Take 1 tablet (40 mg total) by mouth 2 (two) times daily. 60 tablet 5  . simvastatin (ZOCOR) 40 MG tablet Take 20 mg by mouth daily.    . SUMAtriptan (IMITREX) 100 MG tablet Take 1 tablet (100 mg total) by mouth once. May repeat in 2 hours if headache persists or recurs. 12 tablet 5  . traZODone (DESYREL) 100 MG tablet TAKE 1 TABLET BY MOUTH AT BEDTIME 30 tablet 5  . [DISCONTINUED] diltiazem (CARDIZEM CD) 180 MG 24 hr capsule TAKE ONE CAPSULE EVERY DAY 30 capsule 5   No current facility-administered medications on file prior to visit.

## 2014-05-03 NOTE — Telephone Encounter (Signed)
Doxy 100 daily x 7 days 

## 2014-06-02 ENCOUNTER — Other Ambulatory Visit: Payer: Self-pay | Admitting: Family

## 2014-06-06 ENCOUNTER — Other Ambulatory Visit: Payer: Self-pay

## 2014-06-06 MED ORDER — HYDROCHLOROTHIAZIDE 12.5 MG PO CAPS: 12.5000 mg | ORAL_CAPSULE | Freq: Every day | ORAL | Status: DC

## 2014-06-13 ENCOUNTER — Telehealth: Payer: Self-pay | Admitting: Pulmonary Disease

## 2014-06-13 MED ORDER — DOXYCYCLINE HYCLATE 100 MG PO TABS: 100.0000 mg | ORAL_TABLET | Freq: Two times a day (BID) | ORAL | Status: DC

## 2014-06-13 NOTE — Telephone Encounter (Signed)
Clarification, she should see one of [place] (RA first if available)

## 2014-06-13 NOTE — Telephone Encounter (Signed)
Called and spoke with pt and she stated that she feels like she is getting PNA again.  She is having crackles in her upper lobes and she is feeling achy all over and had to leave work early today.  Pt is requesting that RA send in an abx for her.  RA please advise. Thanks  Allergies  Allergen Reactions  . Penicillins Shortness Of Breath and Rash  . Morphine And Related Swelling    Severe pain Morphine only; other opioids OK   Current Outpatient Prescriptions on File Prior to Visit  Medication Sig Dispense Refill  . albuterol (PROVENTIL HFA;VENTOLIN HFA) 108 (90 BASE) MCG/ACT inhaler 2 puffs twice daily for 5 days, then q6h PRN cough/wheeze 1 Inhaler 0  . ALPRAZolam (XANAX) 0.25 MG tablet We recommend using 1/2 or your 0.5 mg tabs every 6 hours as needed for anxiety 30 tablet 0  . ASTRAGALUS PO Take 3 tablets by mouth 2 (two) times daily.    [name] atenolol (TENORMIN) 100 MG tablet Take 100 mg by mouth daily.    . cholecalciferol (VITAMIN D) 1000 UNITS tablet Take 1 tablet (1,000 Units total) by mouth daily. 100 tablet 3  . diazepam (VALIUM) 5 MG tablet Take 1 tablet by mouth 2 (two) times daily as needed.    . diltiazem (TIAZAC) 180 MG 24 hr capsule Take 1 capsule (180 mg total) by mouth daily. 180 capsule 3  . doxycycline (VIBRA-TABS) 100 MG tablet Take 1 tablet (100 mg total) by mouth daily. 7 tablet 0  . gabapentin (NEURONTIN) 300 MG capsule Take 3 capsules by mouth at bedtime.    . hydrochlorothiazide (MICROZIDE) 12.5 MG capsule Take 1 capsule (12.5 mg total) by mouth daily. 90 capsule 0  . Lactobacillus-Inulin (CULTURELLE DIGESTIVE HEALTH PO) Take 1 tablet by mouth daily.    . Misc Natural Products (ESBERITOX PO) Take 2 tablets by mouth 2 (two) times daily.     [name] oxymorphone (OPANA) 10 MG tablet Take 10 mg by mouth 2 (two) times daily. Take 1/2 of your 10 mg tab twice daily as needed for pain-if unable to break tab in half due to extended release formulation recommend cautious use of this  medication at same (10 mg) dosage- follow up with your pain clinic to discuss changes in medication dosages    . pantoprazole (PROTONIX) 40 MG tablet Take 1 tablet (40 mg total) by mouth 2 (two) times daily. 60 tablet 11  . PARoxetine (PAXIL) 40 MG tablet TAKE 1 TABLET (40 MG TOTAL) BY MOUTH 2 (TWO) TIMES DAILY. 60 tablet 5  . simvastatin (ZOCOR) 40 MG tablet Take 20 mg by mouth daily.    . SUMAtriptan (IMITREX) 100 MG tablet Take 1 tablet (100 mg total) by mouth once. May repeat in 2 hours if headache persists or recurs. 12 tablet 5  . traZODone (DESYREL) 100 MG tablet TAKE 1 TABLET BY MOUTH AT BEDTIME 30 tablet 5  . [DISCONTINUED] diltiazem (CARDIZEM CD) 180 MG 24 hr capsule TAKE ONE CAPSULE EVERY DAY 30 capsule 5   No current facility-administered medications on file prior to visit.

## 2014-06-13 NOTE — Telephone Encounter (Signed)
Will send to doc of they day, KC, pls advise.  Thank you.

## 2014-06-13 NOTE — Telephone Encounter (Signed)
Sending to bq

## 2014-06-13 NOTE — Telephone Encounter (Signed)
Pt aware of recs, doxy sent in.  Nothing further needed.

## 2014-06-13 NOTE — Telephone Encounter (Signed)
Doxycycline 100mg  po bid x7 days, take with yogurt or probiotic If no improvement or if dyspnea, fever, chills, pain then come see me

## 2014-06-14 ENCOUNTER — Other Ambulatory Visit: Payer: Self-pay | Admitting: Internal Medicine

## 2014-06-14 ENCOUNTER — Ambulatory Visit: Payer: Self-pay | Admitting: Pulmonary Disease

## 2014-06-14 NOTE — Telephone Encounter (Signed)
She can see me at [facility] today at 2 pm with CXR

## 2014-06-14 NOTE — Telephone Encounter (Signed)
Patient has called in regards to this.  Patient is currently out.  Patient can be reached at [phone].

## 2014-06-14 NOTE — Telephone Encounter (Signed)
Patient scheduled to see Dr. Elsworth Soho at 2pm, attempted to contact patient to confirm appointment, left detailed message on voicemail, awaiting confirmation of appointment.  Santiago Glad also attempted to contact patient.

## 2014-06-14 NOTE — Telephone Encounter (Signed)
Spoke to patient, she is unable to make it today due to work schedule.  She is scheduled to see Tammy Parrett in [place] office on Monday.  Nothing further needed.

## 2014-06-15 NOTE — Telephone Encounter (Signed)
Rf sent. Left detailed mess informing pt.

## 2014-06-18 ENCOUNTER — Ambulatory Visit: Payer: Self-pay | Admitting: Adult Health

## 2014-06-19 ENCOUNTER — Encounter: Payer: Self-pay | Admitting: Adult Health

## 2014-06-19 ENCOUNTER — Ambulatory Visit (INDEPENDENT_AMBULATORY_CARE_PROVIDER_SITE_OTHER): Payer: Medicare Other | Admitting: Adult Health

## 2014-06-19 VITALS — BP 108/64 | HR 58 | Temp 98.7°F | Ht 65.5 in | Wt 138.6 lb

## 2014-06-19 DIAGNOSIS — J189 Pneumonia, unspecified organism: Secondary | ICD-10-CM

## 2014-06-19 DIAGNOSIS — J069 Acute upper respiratory infection, unspecified: Secondary | ICD-10-CM

## 2014-06-19 DIAGNOSIS — R05 Cough: Secondary | ICD-10-CM

## 2014-06-19 DIAGNOSIS — R053 Chronic cough: Secondary | ICD-10-CM

## 2014-06-19 MED ORDER — ALBUTEROL SULFATE HFA 108 (90 BASE) MCG/ACT IN AERS: INHALATION_SPRAY | RESPIRATORY_TRACT | Status: DC

## 2014-06-19 NOTE — Addendum Note (Signed)
Addended by: Parke Poisson E on: 06/19/2014 03:06 PM   Modules accepted: Orders

## 2014-06-19 NOTE — Assessment & Plan Note (Signed)
Last chest x-ray showed resolved pneumonia Recurrent episodes of pneumonia consistent with probable aspiration in the setting of chronic narcotic use  Patient is improved and advised on measures to decrease her risk of aspiration  Plan  Call for first signs of pneumonia -cough, fever, shortness of breath, confusion  Albuterol as needed only for wheezing. GERD diet  Avoid eating 3 hours before bedtime  Do not lie down after eating .  Limit pain medications as much as possible as they make you sleepy and more prone to aspiration.  Follow up Dr. Elsworth Soho  In 3-4 months  and As needed   Please contact office for sooner follow up if symptoms do not improve or worsen or seek emergency care

## 2014-06-19 NOTE — Assessment & Plan Note (Signed)
Resolved with antibiotics 

## 2014-06-19 NOTE — Assessment & Plan Note (Signed)
Cough is improved with reflux, prevention regimen

## 2014-06-19 NOTE — Patient Instructions (Signed)
Call for first signs of pneumonia -cough, fever, shortness of breath, confusion  Albuterol as needed only for wheezing. GERD diet  Avoid eating 3 hours before bedtime  Do not lie down after eating .  Limit pain medications as much as possible as they make you sleepy and more prone to aspiration.  Follow up Dr. Elsworth Soho  In 3-4 months  and As needed   Please contact office for sooner follow up if symptoms do not improve or worsen or seek emergency care

## 2014-06-19 NOTE — Progress Notes (Signed)
Subjective:    Patient ID: Bianca Haley, female    DOB: 03/15/1951, 64 y.o.   MRN: [id number]  HPI  58 never smoker first seen for chronic cough x 51mnths on 02/21/13   Significant tests/ events  She was diagnosed with RLL community acquired pneumonia on 01/04/13 & treated with levaquin with clearing of infiltrate on 01/26/13.  Admitted 02/21/13 for recurrent RLL PNA , hypotension. CT chest showed RML/RLL pneumonia and what appeared to be some progression of bronchiectasis (first noted 2011)   Esophagram was negative. IgG,IgaA and IgM levels were low, but not immunosuppressed.   06/2013 - outpt Rx for pna  09/2013  admitted for recurrent PNA /aspiration pneumonitis . CXR showed R>L consolidation c/w multifocal PNA.  A MBSS this admit without dysphagia but raised concerns of possible primary esophageal dysmotility as barium pill stopped mid esophagus requiring additional thin barium to facilitate transit to stomach , PPI to BID prior to discharge.  CXR FU showed near complete resolution of bilateral infiltrates.     The patient was visiting family out of state in [place], [place], where she was admitted for severe pneumonia. she arrived to her son's home and had to sleep on a air mattress. Within 2 days. Patient was having difficulty breathing, and confusion. She went to the emergency room was found to have severe hypoxic respiratory failure, and bilateral pneumonia. She required vent support. Condition was complicated by septic shock, required pressor support initially. . Patient was treated with IV antibiotics, nebulized bronchodilators. She improved with treatment. CT chest showed no pulmonary embolism. + dense bilateral, lower lobe consolidations, especially along the right middle lobe and left upper lobe. She had mediastinal lymphadenopathy. A 2-D echo showed a normal left ventricular systolic function and ejection fracture 67%. Pulmonary artery pressures were elevated at 43.  Patient was extubated, and weaned off of oxygen therapy. Prior to discharge. She was treated with additional 3 days of LevaquinPatient was told that she had a possible breast lump. She had a mammogram recently, prior to her trip. Encouraged her to speak with her GYN regarding any further testing that might be needed.  She takes opana -last dose by 4pm She takes trazodone 100 mg& neurontin 900 mg at bedtime She also takes Valium as a muscle relaxant and Xanax as needed for anxiety Denies any dyspnea, wheezing, tightness, congestion. no new complaints.  No overt reflux .   Chest x-ray showed right lower lobe infiltrate  06/19/2014 Follow up :recurrent PNA . Presents for a follow up. Patient says that she developed cough and congestion last week. She was called in  Doxycycline. She says that her symptoms are much improved. Cough and congestion have resolved. She denies any hemoptysis, chest pain, orthopnea, PND or leg swelling. She has a tendency towards recurrent pneumonia. Last cxr 03/19/14 with resolved PNA on cxr .  She has adjusted all her sedating and pain medications. Patient is leaving on vacation next week for [place] She did undergo endoscopy in December 2015 that showed mild esophagitis. Path neg for malignancy and H. Pylori .  She remains on Protonix. And reflux diet.    Review of Systems   Constitutional:   No  weight loss, night sweats,  Fevers, chills, fatigue, or  lassitude.  HEENT:   No headaches,  Difficulty swallowing,  Tooth/dental problems, or  Sore throat,                No sneezing, itching, ear ache, nasal congestion, post nasal  drip,   CV:  No chest pain,  Orthopnea, PND, swelling in lower extremities, anasarca, dizziness, palpitations, syncope.   GI  No heartburn, indigestion, abdominal pain, nausea, vomiting, diarrhea, change in bowel habits, loss of appetite, bloody stools.   Resp: No shortness of breath with exertion or at rest.  No excess mucus, no  productive cough,  No non-productive cough,  No coughing up of blood.  No change in color of mucus.  No wheezing.  No chest wall deformity  Skin: no rash or lesions.  GU: no dysuria, change in color of urine, no urgency or frequency.  No flank pain, no hematuria         Objective:   Physical Exam  Gen. Pleasant, well-nourished, in no distress ENT - no lesions, no post nasal drip Neck: No JVD, no thyromegaly, no carotid bruits Lungs: no use of accessory muscles, no dullness to percussion, clear without rales or rhonchi  Cardiovascular: Rhythm regular, heart sounds  normal, no murmurs or gallops, no peripheral edema Musculoskeletal: No deformities, no cyanosis or clubbing        Assessment & Plan:

## 2014-06-21 ENCOUNTER — Telehealth: Payer: Self-pay | Admitting: *Deleted

## 2014-06-21 NOTE — Progress Notes (Signed)
Reviewed & agree with plan  

## 2014-06-21 NOTE — Telephone Encounter (Signed)
Pt filled out walk in sheet c/o left ankle edema. She states [facility] has faxed our office requesting refill on HCTZ and we never replied. I called [facility] to make sure they received the refill we sent on 06/06/14. I spoke to [place] [facility], who stated they did receive refill, but put it on hold because it is not due to fill again until 06/30/14. Pt informed

## 2014-07-09 ENCOUNTER — Other Ambulatory Visit: Payer: Self-pay | Admitting: Internal Medicine

## 2014-07-16 ENCOUNTER — Telehealth: Payer: Self-pay | Admitting: Internal Medicine

## 2014-07-16 NOTE — Telephone Encounter (Signed)
Rec'd from [facility] forward 4 pages to Dr.Plotnikov

## 2014-07-18 ENCOUNTER — Telehealth: Payer: Self-pay

## 2014-07-18 NOTE — Telephone Encounter (Signed)
07/18/14 Disc received from [facility]; down on [place].

## 2014-07-31 ENCOUNTER — Ambulatory Visit: Payer: Self-pay

## 2014-08-10 ENCOUNTER — Telehealth: Payer: Self-pay | Admitting: Pulmonary Disease

## 2014-08-10 MED ORDER — AMOXICILLIN-POT CLAVULANATE 875-125 MG PO TABS: 1.0000 | ORAL_TABLET | Freq: Two times a day (BID) | ORAL | Status: DC

## 2014-08-10 NOTE — Telephone Encounter (Signed)
Patient notified. Rx sent to pharmacy. Nothing further needed.  

## 2014-08-10 NOTE — Telephone Encounter (Signed)
Patient has had low grade fever, took Tylenol, went down.  Stomach is upset, headache, unable to eat, wheezing a lot, dry cough.   RA - please advise.

## 2014-08-10 NOTE — Telephone Encounter (Signed)
augmentin 875 bid x 7 days Take probiotic with this

## 2014-08-13 ENCOUNTER — Telehealth: Payer: Self-pay | Admitting: Pulmonary Disease

## 2014-08-13 MED ORDER — PREDNISONE 10 MG PO TABS: ORAL_TABLET | ORAL | Status: DC

## 2014-08-13 MED ORDER — AZITHROMYCIN 250 MG PO TABS: ORAL_TABLET | ORAL | Status: DC

## 2014-08-13 NOTE — Telephone Encounter (Signed)
Zpak PCN listed as allergy Benadryl 10 mg bid x 3 days Prednisone 10 mg tabs  Take 2 tabs daily with food x 5ds, then 1 tab daily with food x 5ds then STOP

## 2014-08-13 NOTE — Telephone Encounter (Signed)
Rx sent to pharmacy. Patient notified. Nothing further needed.  

## 2014-08-13 NOTE — Telephone Encounter (Signed)
Patient given Penicillin and she is having a reaction to the penicillin.  She has a rash in between her fingers that is very painful and the rash is starting to move up her arms.  She wants a different antibiotic and would like something to help with the rash.  RA - please advise.

## 2014-08-14 ENCOUNTER — Ambulatory Visit: Payer: Self-pay

## 2014-08-17 ENCOUNTER — Emergency Department (HOSPITAL_COMMUNITY): Payer: Medicare Other

## 2014-08-17 ENCOUNTER — Emergency Department (HOSPITAL_COMMUNITY)
Admission: EM | Admit: 2014-08-17 | Discharge: 2014-08-17 | Disposition: A | Discharge status: Home / Self Care | Payer: Medicare Other | Attending: Emergency Medicine | Admitting: Emergency Medicine

## 2014-08-17 ENCOUNTER — Encounter (HOSPITAL_COMMUNITY): Payer: Self-pay | Admitting: *Deleted

## 2014-08-17 DIAGNOSIS — N3091 Cystitis, unspecified with hematuria: Secondary | ICD-10-CM | POA: Diagnosis not present

## 2014-08-17 DIAGNOSIS — G43909 Migraine, unspecified, not intractable, without status migrainosus: Secondary | ICD-10-CM | POA: Diagnosis not present

## 2014-08-17 DIAGNOSIS — Z8701 Personal history of pneumonia (recurrent): Secondary | ICD-10-CM | POA: Insufficient documentation

## 2014-08-17 DIAGNOSIS — R109 Unspecified abdominal pain: Secondary | ICD-10-CM

## 2014-08-17 DIAGNOSIS — Z880 Allergy status to penicillin: Secondary | ICD-10-CM | POA: Insufficient documentation

## 2014-08-17 DIAGNOSIS — I129 Hypertensive chronic kidney disease with stage 1 through stage 4 chronic kidney disease, or unspecified chronic kidney disease: Secondary | ICD-10-CM | POA: Insufficient documentation

## 2014-08-17 DIAGNOSIS — K219 Gastro-esophageal reflux disease without esophagitis: Secondary | ICD-10-CM | POA: Diagnosis not present

## 2014-08-17 DIAGNOSIS — Z87440 Personal history of urinary (tract) infections: Secondary | ICD-10-CM | POA: Insufficient documentation

## 2014-08-17 DIAGNOSIS — Z862 Personal history of diseases of the blood and blood-forming organs and certain disorders involving the immune mechanism: Secondary | ICD-10-CM | POA: Insufficient documentation

## 2014-08-17 DIAGNOSIS — Z8742 Personal history of other diseases of the female genital tract: Secondary | ICD-10-CM | POA: Insufficient documentation

## 2014-08-17 DIAGNOSIS — N189 Chronic kidney disease, unspecified: Secondary | ICD-10-CM | POA: Diagnosis not present

## 2014-08-17 DIAGNOSIS — Z8739 Personal history of other diseases of the musculoskeletal system and connective tissue: Secondary | ICD-10-CM | POA: Diagnosis not present

## 2014-08-17 DIAGNOSIS — Z79899 Other long term (current) drug therapy: Secondary | ICD-10-CM | POA: Insufficient documentation

## 2014-08-17 DIAGNOSIS — F419 Anxiety disorder, unspecified: Secondary | ICD-10-CM | POA: Insufficient documentation

## 2014-08-17 DIAGNOSIS — R319 Hematuria, unspecified: Secondary | ICD-10-CM | POA: Diagnosis present

## 2014-08-17 LAB — CBC WITH DIFFERENTIAL/PLATELET
Basophils Absolute: 0 10*3/uL (ref 0.0–0.1)
Basophils Relative: 0 % (ref 0–1)
EOS ABS: 1.2 10*3/uL — AB (ref 0.0–0.7)
Eosinophils Relative: 8 % — ABNORMAL HIGH (ref 0–5)
HCT: 40.6 % (ref 36.0–46.0)
HEMOGLOBIN: 13.2 g/dL (ref 12.0–15.0)
LYMPHS ABS: 2.5 10*3/uL (ref 0.7–4.0)
Lymphocytes Relative: 16 % (ref 12–46)
MCH: 26.1 pg (ref 26.0–34.0)
MCHC: 32.5 g/dL (ref 30.0–36.0)
MCV: 80.4 fL (ref 78.0–100.0)
MONOS PCT: 8 % (ref 3–12)
Monocytes Absolute: 1.2 10*3/uL — ABNORMAL HIGH (ref 0.1–1.0)
Neutro Abs: 11 10*3/uL — ABNORMAL HIGH (ref 1.7–7.7)
Neutrophils Relative %: 68 % (ref 43–77)
PLATELETS: 366 10*3/uL (ref 150–400)
RBC: 5.05 MIL/uL (ref 3.87–5.11)
RDW: 14.7 % (ref 11.5–15.5)
WBC: 16 10*3/uL — AB (ref 4.0–10.5)

## 2014-08-17 LAB — URINALYSIS, ROUTINE W REFLEX MICROSCOPIC
Glucose, UA: 100 mg/dL — AB
KETONES UR: 40 mg/dL — AB
NITRITE: POSITIVE — AB
SPECIFIC GRAVITY, URINE: 1.014 (ref 1.005–1.030)
Urobilinogen, UA: 1 mg/dL (ref 0.0–1.0)
pH: 5 (ref 5.0–8.0)

## 2014-08-17 LAB — COMPREHENSIVE METABOLIC PANEL
ALBUMIN: 3.8 g/dL (ref 3.5–5.2)
ALT: 10 U/L (ref 0–35)
AST: 15 U/L (ref 0–37)
Alkaline Phosphatase: 84 U/L (ref 39–117)
Anion gap: 9 (ref 5–15)
BILIRUBIN TOTAL: 0.7 mg/dL (ref 0.3–1.2)
BUN: 6 mg/dL (ref 6–23)
CO2: 29 mmol/L (ref 19–32)
CREATININE: 0.86 mg/dL (ref 0.50–1.10)
Calcium: 9.6 mg/dL (ref 8.4–10.5)
Chloride: 104 mmol/L (ref 96–112)
GFR calc Af Amer: 80 mL/min — ABNORMAL LOW (ref >90)
GFR, EST NON AFRICAN AMERICAN: 69 mL/min — AB (ref >90)
Glucose, Bld: 91 mg/dL (ref 70–99)
Potassium: 4.3 mmol/L (ref 3.5–5.1)
Sodium: 142 mmol/L (ref 135–145)
Total Protein: 6.8 g/dL (ref 6.0–8.3)

## 2014-08-17 LAB — URINE MICROSCOPIC-ADD ON

## 2014-08-17 LAB — LIPASE, BLOOD: LIPASE: 23 U/L (ref 11–59)

## 2014-08-17 MED ORDER — OXYCODONE-ACETAMINOPHEN 5-325 MG PO TABS: 1.0000 | ORAL_TABLET | Freq: Four times a day (QID) | ORAL | Status: DC | PRN

## 2014-08-17 MED ORDER — SODIUM CHLORIDE 0.9 % IV BOLUS (SEPSIS)
500.0000 mL | Freq: Once | INTRAVENOUS | Status: AC
  Administered 2014-08-17: 500 mL via INTRAVENOUS

## 2014-08-17 MED ORDER — CIPROFLOXACIN IN D5W 400 MG/200ML IV SOLN
400.0000 mg | Freq: Once | INTRAVENOUS | Status: AC
  Administered 2014-08-17: 400 mg via INTRAVENOUS
  Filled 2014-08-17: qty 200

## 2014-08-17 MED ORDER — CIPROFLOXACIN HCL 500 MG PO TABS: 500.0000 mg | ORAL_TABLET | Freq: Two times a day (BID) | ORAL | Status: DC

## 2014-08-17 MED ORDER — SODIUM CHLORIDE 0.9 % IV BOLUS (SEPSIS): 1000.0000 mL | Freq: Once | INTRAVENOUS | Status: DC

## 2014-08-17 MED ORDER — CIPROFLOXACIN IN D5W 400 MG/200ML IV SOLN: 400.0000 mg | Freq: Two times a day (BID) | INTRAVENOUS | Status: DC

## 2014-08-17 MED ORDER — ONDANSETRON HCL 4 MG/2ML IJ SOLN
4.0000 mg | Freq: Once | INTRAMUSCULAR | Status: AC
  Administered 2014-08-17: 4 mg via INTRAVENOUS
  Filled 2014-08-17: qty 2

## 2014-08-17 MED ORDER — KETOROLAC TROMETHAMINE 30 MG/ML IJ SOLN
30.0000 mg | Freq: Once | INTRAMUSCULAR | Status: AC
  Administered 2014-08-17: 30 mg via INTRAVENOUS
  Filled 2014-08-17: qty 1

## 2014-08-17 MED ORDER — HYDROMORPHONE HCL 1 MG/ML IJ SOLN
1.0000 mg | Freq: Once | INTRAMUSCULAR | Status: AC
  Administered 2014-08-17: 1 mg via INTRAVENOUS
  Filled 2014-08-17: qty 1

## 2014-08-17 NOTE — ED Notes (Signed)
The pt  Is c/o severe abd  And back pain with bloody urine and urinary frequency since 1700.

## 2014-08-17 NOTE — ED Provider Notes (Signed)
CSN: [id number]     Arrival date & time 08/17/14  1914 History   First MD Initiated Contact with Patient 08/17/14 1959     Chief Complaint  Patient presents with  . Hematuria    (Consider location/radiation/quality/duration/timing/severity/associated sxs/prior Treatment) HPI Comments: 64 year old female with a history of hypertension, back pain, anemia, anxiety, and esophageal reflux presents to the emergency department for further evaluation of abdominal pain and hematuria. Patient reports a constant pain in her abdomen which was sudden in onset at 1700 followed by onset of gross hematuria. Patient denies any modifying factors of her symptoms and states that it waxes and wanes in severity. She reports associated pain in her back, left side worse than right. She has also had urinary frequency and a burning dysuria which worsens toward the end of voiding. She denies associated fever, chest pain, shortness of breath, nausea, vomiting, vaginal bleeding or discharge, or trauma. [facility] and was advised to come to the ED for kidney stone evaluation. She denies a personal or family history of kidney stones.  Patient is a 64 y.o. female presenting with hematuria and flank pain. The history is provided by the patient. No language interpreter was used.  Hematuria This is a new problem. The current episode started today. The problem occurs constantly. The problem has been unchanged. Associated symptoms include abdominal pain and urinary symptoms. Pertinent negatives include no change in bowel habit, fever, nausea or vomiting. Nothing aggravates the symptoms. She has tried nothing for the symptoms.  Flank Pain This is a new problem. The current episode started today. The problem occurs constantly. The problem has been waxing and waning. Associated symptoms include abdominal pain and urinary symptoms. Pertinent negatives include no change in bowel habit, fever,  nausea or vomiting. She has tried nothing for the symptoms.    Past Medical History  Diagnosis Date  . Hypertension   . Back pain   . Headache(784.0)   . Migraine   . Anemia   . Anxiety   . History of UTI   . History of endometriosis   . History of uterine fibroid   . H/O tinnitus     left  . GERD (gastroesophageal reflux disease)   . Chronic renal failure   . Recurrent pneumonia    Past Surgical History  Procedure Laterality Date  . Abdominal hysterectomy    . Bladder suspension    . Back surgery    . Colonoscopy  02/16/2008    normal  . Laparoscopy      for evauation of endometriosis   Family History  Problem Relation Age of Onset  . Heart disease Mother   . Emphysema Mother    History  Substance Use Topics  . Smoking status: Never Smoker   . Smokeless tobacco: Never Used  . Alcohol Use: No     Comment: no drinking for sevral months   OB History    Gravida Para Term Preterm AB TAB SAB Ectopic Multiple Living   2 2 2   0 0 0 0 0 2      Review of Systems  Constitutional: Negative for fever.  Gastrointestinal: Positive for abdominal pain. Negative for nausea, vomiting and change in bowel habit.  Genitourinary: Positive for dysuria, frequency, hematuria and flank pain. Negative for vaginal bleeding and vaginal discharge.  All other systems reviewed and are negative.   Allergies  Penicillins and Morphine and related  Home Medications   Prior to  Admission medications   Medication Sig Start Date End Date Taking? Authorizing Provider  albuterol (PROVENTIL HFA;VENTOLIN HFA) 108 (90 BASE) MCG/ACT inhaler 2 puffs every 4-6 hours as needed for wheezing/shortness of breath 06/19/14  Yes Tammy S Parrett, NP  ALPRAZolam (XANAX) 0.25 MG tablet We recommend using 1/2 or your 0.5 mg tabs every 6 hours as needed for anxiety 09/29/13  Yes Samella Parr, NP  ASTRAGALUS PO Take 2 tablets by mouth 2 (two) times daily.    Yes Historical Provider, MD  atenolol (TENORMIN) 100  MG tablet Take 100 mg by mouth daily.   Yes Historical Provider, MD  azithromycin (ZITHROMAX) 250 MG tablet Use as directed. Patient taking differently: Take 250 mg by mouth daily. Started 08/12/13, for 5 days ending 4/16/15Use as directed. 08/13/14  Yes Rigoberto Noel, MD  cholecalciferol (VITAMIN D) 1000 UNITS tablet Take 1 tablet (1,000 Units total) by mouth daily. 10/13/13 10/13/14 Yes Aleksei Plotnikov V, MD  diltiazem (TIAZAC) 180 MG 24 hr capsule Take 1 capsule (180 mg total) by mouth daily. 12/07/13  Yes Aleksei Plotnikov V, MD  gabapentin (NEURONTIN) 300 MG capsule Take 2 capsules by mouth at bedtime.  01/09/14  Yes Historical Provider, MD  hydrochlorothiazide (MICROZIDE) 12.5 MG capsule Take 1 capsule (12.5 mg total) by mouth daily. Patient taking differently: Take 12.5 mg by mouth as needed (for knee fluid).  06/06/14  Yes Golden Circle, FNP  oxymorphone (OPANA) 10 MG tablet Take 10 mg by mouth daily as needed for pain.  09/29/13  Yes Samella Parr, NP  pantoprazole (PROTONIX) 40 MG tablet Take 1 tablet (40 mg total) by mouth 2 (two) times daily. 10/25/13  Yes Aleksei Plotnikov V, MD  PARoxetine (PAXIL) 40 MG tablet TAKE 1 TABLET (40 MG TOTAL) BY MOUTH 2 (TWO) TIMES DAILY. 05/03/14  Yes Aleksei Plotnikov V, MD  predniSONE (DELTASONE) 10 MG tablet Take 2 tablets x 5 days, then 1 tablet x 5 days, then STOP 08/13/14  Yes Rigoberto Noel, MD  simvastatin (ZOCOR) 40 MG tablet TAKE 1/2 TABLET BY MOUTH ONCE DAILY 07/10/14  Yes Aleksei Plotnikov V, MD  SUMAtriptan (IMITREX) 100 MG tablet Take 1 tablet (100 mg total) by mouth once. May repeat in 2 hours if headache persists or recurs. 10/25/13  Yes Aleksei Plotnikov V, MD  traZODone (DESYREL) 100 MG tablet TAKE 1 TABLET BY MOUTH AT BEDTIME 06/15/14  Yes Aleksei Plotnikov V, MD  amoxicillin-clavulanate (AUGMENTIN) 875-125 MG per tablet Take 1 tablet by mouth 2 (two) times daily. Patient not taking: Reported on 08/17/2014 08/10/14   Rigoberto Noel, MD  ciprofloxacin  (CIPRO) 500 MG tablet Take 1 tablet (500 mg total) by mouth 2 (two) times daily. 08/17/14   Antonietta Breach, PA-C  doxycycline (VIBRA-TABS) 100 MG tablet Take 1 tablet (100 mg total) by mouth 2 (two) times daily. 06/13/14   Juanito Doom, MD  oxyCODONE-acetaminophen (PERCOCET/ROXICET) 5-325 MG per tablet Take 1-2 tablets by mouth every 6 (six) hours as needed for severe pain. 08/17/14   Antonietta Breach, PA-C   BP 176/89 mmHg  Pulse 70  Temp(Src) 98.2 F (36.8 C) (Oral)  Resp 18  Ht 5\' 5"  (1.651 m)  Wt 140 lb (63.504 kg)  BMI 23.30 kg/m2  SpO2 97%   Physical Exam  Constitutional: She is oriented to person, place, and time. She appears well-developed and well-nourished. No distress.  Nontoxic/nonseptic appearing. Patient appears intermittently uncomfortable, wincing.  HENT:  Head: Normocephalic and atraumatic.  Eyes: Conjunctivae  and EOM are normal. No scleral icterus.  Neck: Normal range of motion.  Cardiovascular: Normal rate, regular rhythm and intact distal pulses.   Pulmonary/Chest: Effort normal and breath sounds normal. No respiratory distress. She has no wheezes. She has no rales.  Lungs clear bilaterally. Respirations even and unlabored  Abdominal: Soft. She exhibits no distension. There is tenderness. There is no rebound and no guarding.  Tenderness to palpation in the suprapubic region as well as the left lower quadrant and left mid abdomen. There is left CVA tenderness. No peritoneal signs, masses, or guarding.  Musculoskeletal: Normal range of motion.  Neurological: She is alert and oriented to person, place, and time. She exhibits normal muscle tone. Coordination normal.  GCS 15. Speech is goal oriented. Patient moves extremities without ataxia.  Skin: Skin is warm and dry. No rash noted. She is not diaphoretic. No erythema. No pallor.  Psychiatric: She has a normal mood and affect. Her behavior is normal.  Nursing note and vitals reviewed.   ED Course  Procedures (including  critical care time) Labs Review Labs Reviewed  CBC WITH DIFFERENTIAL/PLATELET - Abnormal; Notable for the following:    WBC 16.0 (*)    Neutro Abs 11.0 (*)    Monocytes Absolute 1.2 (*)    Eosinophils Relative 8 (*)    Eosinophils Absolute 1.2 (*)    All other components within normal limits  COMPREHENSIVE METABOLIC PANEL - Abnormal; Notable for the following:    GFR calc non Af Amer 69 (*)    GFR calc Af Amer 80 (*)    All other components within normal limits  URINALYSIS, ROUTINE W REFLEX MICROSCOPIC - Abnormal; Notable for the following:    Color, Urine RED (*)    APPearance TURBID (*)    Glucose, UA 100 (*)    Hgb urine dipstick LARGE (*)    Bilirubin Urine LARGE (*)    Ketones, ur 40 (*)    Protein, ur >300 (*)    Nitrite POSITIVE (*)    Leukocytes, UA LARGE (*)    All other components within normal limits  URINE MICROSCOPIC-ADD ON - Abnormal; Notable for the following:    Squamous Epithelial / LPF FEW (*)    Bacteria, UA FEW (*)    All other components within normal limits  URINE CULTURE  LIPASE, BLOOD    Imaging Review Ct Abdomen Pelvis Wo Contrast  08/17/2014   CLINICAL DATA:  Left flank pain today, gross hematuria. Symptoms since this afternoon.  EXAM: CT ABDOMEN AND PELVIS WITHOUT CONTRAST  TECHNIQUE: Multidetector CT imaging of the abdomen and pelvis was performed following the standard protocol without IV contrast.  COMPARISON:  CT 11/23/2009  FINDINGS: Probable scarring in the right lower lobe. Minimal bronchiectasis adjacent to scarring. Minimal subsegmental atelectasis in the medial left lower lobe. Heart is at the upper limits of normal in size  Left-sided phlebolith in the pelvis is adjacent to the left ureter, and is unchanged compared to prior CT. There are no ureteric stones. No left urolithiasis. No hydronephrosis or renal obstruction. Punctate calcification medial upper right kidney is likely a nonobstructing stone. Right ureter is decompressed, no ureteral  stone. No right hydronephrosis.  The urinary bladder is minimally distended, however mild bladder wall thickening and perivesicular inflammatory change.  No focal hepatic lesion on noncontrast exam. The gallbladder is minimally distended. There is question of extrahepatic biliary ductal dilatation, the distal common bile duct measures 9 mm. This is suboptimally defined without contrast. There  is questionable proximal pancreatic ductal dilatation 4 mm. No surrounding inflammatory change. Limited pancreatic assessment given lack of contrast. The unenhanced spleen and adrenal glands are normal.  The stomach is physiologically distended. There are no dilated or thickened bowel loops. The appendix is normal. Small to moderate volume of colonic stool without colonic wall thickening.  Abdominal aorta is normal in caliber minimal atherosclerosis. No retroperitoneal adenopathy.  Within the pelvis the uterus is not seen presumably surgically absent. No adnexal mass. No pelvic free fluid.  There are no acute or suspicious osseous abnormalities. There is degenerative and postsurgical change in the lumbar spine.  IMPRESSION: 1. Nonobstructing right renal calculus, no obstructive uropathy. Calcification in the left pelvis is adjacent to the ureter and represents a phlebolith, unchanged from prior exam. No left hydronephrosis. 2. Diffuse urinary bladder thickening and suggestion of mild perivesicular inflammatory change. The findings suggest cystitis, correlation with urinalysis recommended. 3. Suggestion of extrahepatic biliary ductal dilatation, and possible pancreatic ductal dilatation, incompletely characterized without contrast. Recommend correlation with LFTs. Nonemergent MRCP and MRI of the pancreas recommended further evaluation.   Electronically Signed   By: Jeb Levering M.D.   On: 08/17/2014 21:33     EKG Interpretation None      MDM   Final diagnoses:  Flank pain  Hemorrhagic cystitis    Pt has been  diagnosed with a UTI. Pt is afebrile, nontoxic/nonseptic appearing, normotensive, and denies N/V. Hematuria likely secondary to hemorrhagic cystitis. No evidence of complicated nephro/ureterolithiasis. Given IV Cipro prior to discharge. Urine culture pending. Pt to be discharged home with antibiotics and instructions to follow up with PCP in 3 days. Return precautions given. Patient agreeable to plan with no unaddressed concerns. Patient discharged in good condition; VSS.   Filed Vitals:   08/17/14 1927 08/17/14 2047 08/17/14 2100 08/17/14 2255  BP: 179/73 170/76 176/89   Pulse: 64 68 70   Temp: 98.6 F (37 C)   98.2 F (36.8 C)  TempSrc: Oral   Oral  Resp: 18     Height: 5\' 5"  (1.651 m)     Weight: 140 lb (63.504 kg)     SpO2: 96% 96% 97%       Antonietta Breach, PA-C 08/17/14 2303  Dorie Rank, MD 08/18/14 [phone]

## 2014-08-17 NOTE — ED Notes (Signed)
Pharmacy to send toradol, unable to get from pyxis.

## 2014-08-17 NOTE — Discharge Instructions (Signed)

## 2014-08-17 NOTE — Progress Notes (Signed)
ANTIBIOTIC CONSULT NOTE - INITIAL  Pharmacy Consult for ciprofloxacin Indication: pyelonephritis  Allergies  Allergen Reactions  . Penicillins Shortness Of Breath and Rash  . Morphine And Related Swelling    Severe pain Morphine only; other opioids OK    Patient Measurements: Height: 5\' 5"  (165.1 cm) Weight: 140 lb (63.504 kg) IBW/kg (Calculated) : 57  Vital Signs: Temp: 98.6 F (37 C) (04/15 1927) Temp Source: Oral (04/15 1927) BP: 179/73 mmHg (04/15 1927) Pulse Rate: 64 (04/15 1927) Intake/Output from previous day:   Intake/Output from this shift:    Labs:  Recent Labs  08/17/14 1942  WBC 16.0*  HGB 13.2  PLT 366  CREATININE 0.86   Estimated Creatinine Clearance: 57.9 mL/min (by C-G formula based on Cr of 0.86). No results for input(s): VANCOTROUGH, VANCOPEAK, VANCORANDOM, GENTTROUGH, GENTPEAK, GENTRANDOM, TOBRATROUGH, TOBRAPEAK, TOBRARND, AMIKACINPEAK, AMIKACINTROU, AMIKACIN in the last 72 hours.   Microbiology: No results found for this or any previous visit (from the past 720 hour(s)).  Medical History: Past Medical History  Diagnosis Date  . Hypertension   . Back pain   . Headache(784.0)   . Migraine   . Anemia   . Anxiety   . History of UTI   . History of endometriosis   . History of uterine fibroid   . H/O tinnitus     left  . GERD (gastroesophageal reflux disease)   . Chronic renal failure   . Recurrent pneumonia     Medications:  Anti-infectives    Start     Dose/Rate Route Frequency Ordered Stop   08/17/14 2030  ciprofloxacin (CIPRO) IVPB 400 mg     400 mg 200 mL/hr over 60 Minutes Intravenous  Once 08/17/14 2027       Assessment: 70 yoF who presents with hematuria, flank pain, urinary frequency, and dysuria. CT to rule out kidney stone pending. Pharmacy consulted to dose ciprofloxacin for pyelonephritis. Afebrile, WBC 16.0, SCr 0.86, CrCl 55-60 ml/min  4/15 UCx >>  Goal of Therapy:  Clinical resolution of infection  Plan:   Cipro 400 mg IV load, then 400 mg IV q12h Follow up C&S, clinical progress  Jeanelle Malling 08/17/2014,8:52 PM

## 2014-08-20 ENCOUNTER — Telehealth: Payer: Self-pay | Admitting: Internal Medicine

## 2014-08-20 ENCOUNTER — Telehealth (HOSPITAL_COMMUNITY): Payer: Self-pay

## 2014-08-20 NOTE — Telephone Encounter (Signed)
Patient was in ER for bad bladder infection and has some pretty bad back pain. She was wondering if she could have a refill on oxyCODONE-acetaminophen (PERCOCET/ROXICET) 5-325 MG per tablet [[id number]] . And she also needs a prescription for SUMAtriptan (IMITREX) 100 MG tablet [[id number]. Pharmacy is [facility] on [address]. She does have an ER follow up appt this Fri.

## 2014-08-21 ENCOUNTER — Telehealth: Payer: Self-pay | Admitting: Internal Medicine

## 2014-08-21 MED ORDER — SUMATRIPTAN SUCCINATE 100 MG PO TABS: 100.0000 mg | ORAL_TABLET | Freq: Once | ORAL | Status: DC

## 2014-08-21 NOTE — Telephone Encounter (Signed)
Received medical records from [facility], sent to Dr. Alain Marion. 08/21/14/ss

## 2014-08-21 NOTE — Telephone Encounter (Signed)
Pls sch OV w/any provider. OK to ref Sumatriptan Thx

## 2014-08-21 NOTE — ED Notes (Signed)
Chart sent to EDP for review

## 2014-08-21 NOTE — Telephone Encounter (Signed)
Notified pt with md response. Pt has already made ER f/u for this Friday 4/22...Bianca Haley

## 2014-08-23 ENCOUNTER — Telehealth (HOSPITAL_BASED_OUTPATIENT_CLINIC_OR_DEPARTMENT_OTHER): Payer: Self-pay | Admitting: Emergency Medicine

## 2014-08-23 NOTE — ED Provider Notes (Signed)
10:30- laboratory results returned, and were seen by me today. Urine culture is positive for Escherichia coli, multidrug resistant, ESBL. The sensitivities have been reported, but are not visualized, in EPIC. I discussed the case with the clinical pharmacist on call today. She was able to contact the lab and determine the sensitivities. She recommends the patient be treated with fosfomycin 3 g by mouth every other day 3. I communicated this information to both the floor manager, and the ED charge nurse. I have filled out the form presented to me with the recommended treatment. The charge nurse informs me that he will contact the patient with the information and attempt to call the prescription in. The patient has a follow-up appointment with her primary care doctor, tomorrow.  Daleen Bo, MD 08/27/14 1630

## 2014-08-23 NOTE — Telephone Encounter (Signed)
Post ED Visit - Positive Culture Follow-up: Successful Patient Follow-Up  Culture assessed and recommendations reviewed by: []  Wes Cresson, Pharm.D., BCPS []  Heide Guile, Pharm.D., BCPS []  Alycia Rossetti, Pharm.D., BCPS []  [place], Pharm.D., BCPS, AAHIVP []  Legrand Como, Pharm.D., BCPS, AAHIVP []  Hassie Bruce, Pharm.D. []  Milus Glazier, Pharm.D.  Positive urine culture ESBL  [x]  Patient discharged without antimicrobial prescription and treatment is now indicated []  Organism is resistant to prescribed ED discharge antimicrobial []  Patient with positive blood cultures  Changes discussed with ED provider: [name] New antibiotic prescription Fosfomycin Called [facility]  Contacted patient, 08/23/14 1150   [name] 08/23/2014, 11:50 AM

## 2014-08-24 ENCOUNTER — Ambulatory Visit (INDEPENDENT_AMBULATORY_CARE_PROVIDER_SITE_OTHER): Payer: Medicare Other | Admitting: Internal Medicine

## 2014-08-24 ENCOUNTER — Encounter: Payer: Self-pay | Admitting: Internal Medicine

## 2014-08-24 ENCOUNTER — Other Ambulatory Visit (INDEPENDENT_AMBULATORY_CARE_PROVIDER_SITE_OTHER): Payer: Medicare Other

## 2014-08-24 VITALS — BP 170/90 | HR 62 | Temp 98.4°F | Wt 140.0 lb

## 2014-08-24 DIAGNOSIS — N3091 Cystitis, unspecified with hematuria: Secondary | ICD-10-CM

## 2014-08-24 DIAGNOSIS — J189 Pneumonia, unspecified organism: Secondary | ICD-10-CM

## 2014-08-24 DIAGNOSIS — I10 Essential (primary) hypertension: Secondary | ICD-10-CM | POA: Diagnosis not present

## 2014-08-24 LAB — URINE CULTURE: Colony Count: >=100000

## 2014-08-24 LAB — URINALYSIS
Bilirubin Urine: NEGATIVE
HGB URINE DIPSTICK: NEGATIVE
Ketones, ur: NEGATIVE
Leukocytes, UA: NEGATIVE
NITRITE: NEGATIVE
PH: 6 (ref 5.0–8.0)
SPECIFIC GRAVITY, URINE: 1.025 (ref 1.000–1.030)
Total Protein, Urine: NEGATIVE
URINE GLUCOSE: NEGATIVE
Urobilinogen, UA: 0.2 (ref 0.0–1.0)

## 2014-08-24 MED ORDER — NITROFURANTOIN MONOHYD MACRO 100 MG PO CAPS: 100.0000 mg | ORAL_CAPSULE | Freq: Two times a day (BID) | ORAL | Status: DC

## 2014-08-24 MED ORDER — HYDROCHLOROTHIAZIDE 12.5 MG PO CAPS: 12.5000 mg | ORAL_CAPSULE | Freq: Every day | ORAL | Status: DC

## 2014-08-24 NOTE — Assessment & Plan Note (Signed)
No recent relapse 

## 2014-08-24 NOTE — Assessment & Plan Note (Addendum)
4/16 CT ok Blood in urine has resolved. Pt got a call - urine cx is Cipro resistant; Monurol called in - too $$$.  E coli - ?Cipro resistant. Clinically better. Repeat UA/Cx. Macrobid if worse  08/17/14 abd CT IMPRESSION: 1. Nonobstructing right renal calculus, no obstructive uropathy. Calcification in the left pelvis is adjacent to the ureter and represents a phlebolith, unchanged from prior exam. No left hydronephrosis. 2. Diffuse urinary bladder thickening and suggestion of mild perivesicular inflammatory change. The findings suggest cystitis, correlation with urinalysis recommended. 3. Suggestion of extrahepatic biliary ductal dilatation, and possible pancreatic ductal dilatation, incompletely characterized without contrast. Recommend correlation with LFTs. Nonemergent MRCP and MRI of the pancreas recommended further evaluation.   Electronically Signed  By: Jeb Levering M.D.  On: 08/17/2014 21:33

## 2014-08-24 NOTE — Assessment & Plan Note (Signed)
Added HCTZ ?

## 2014-08-24 NOTE — Progress Notes (Signed)
Subjective:    HPI  C/o bloody urine since last wk. Pt went to [facility]. She was put on Amoxicillin - had a rash, then on Cipro, however Cx is Cipro resistant. Blood in urine has resolved. Pt got a call - urine cx is Cipro resistant; Monurol called in - too $$$.  Recent Hx: F/u recent B (?)aspiration pneumonia (4 episodes total) - 3 weeks ago in [place]. Her pneumonia problems onset co-incided with the beginning of Opana use... Pt had 2 swallow tests (nl), denies oversedation w/any of her meds She has an Clinical cytogeneticist  Pulm - Dr Elsworth Soho Chest CT 2014   The patient is here to follow up on chronic depression, anxiety, insomnia    Review of Systems  Constitutional: Negative for chills, activity change, appetite change, fatigue and unexpected weight change.  HENT: Negative for congestion, mouth sores and sinus pressure.   Eyes: Negative for visual disturbance.  Respiratory: Negative for cough and chest tightness.   Gastrointestinal: Negative for nausea and abdominal pain.  Genitourinary: Negative for frequency, difficulty urinating and vaginal pain.  Musculoskeletal: Negative for back pain and gait problem.  Skin: Negative for pallor and rash.  Neurological: Negative for dizziness, tremors, weakness and headaches.  Psychiatric/Behavioral: Negative for suicidal ideas, confusion and sleep disturbance.        Objective:   Physical Exam  Constitutional: She appears well-developed. No distress.  HENT:  Head: Normocephalic.  Right Ear: External ear normal.  Left Ear: External ear normal.  Nose: Nose normal.  Mouth/Throat: Oropharynx is clear and moist.  Eyes: Conjunctivae are normal. Pupils are equal, round, and reactive to light. Right eye exhibits no discharge. Left eye exhibits no discharge.  Neck: Normal range of motion. Neck supple. No JVD present. No tracheal deviation present. No thyromegaly present.  Cardiovascular: Normal rate, regular rhythm and normal heart  sounds.   Pulmonary/Chest: No stridor. No respiratory distress. She has no wheezes.  Abdominal: Soft. Bowel sounds are normal. She exhibits no distension and no mass. There is no tenderness. There is no rebound and no guarding.  Musculoskeletal: OK Lymphadenopathy:    She has no cervical adenopathy.  Neurological: She displays normal reflexes. No cranial nerve deficit. She exhibits normal muscle tone. Coordination normal.  Skin: No rash noted. No erythema.  Psychiatric: She has a normal mood and affect. Her behavior is normal. Judgment and thought content normal.   Lab Results  Component Value Date   WBC 16.0* 08/17/2014   HGB 13.2 08/17/2014   HCT 40.6 08/17/2014   PLT 366 08/17/2014   GLUCOSE 91 08/17/2014   CHOL 196 10/31/2012   TRIG 82.0 10/31/2012   HDL 90.70 10/31/2012   LDLCALC 89 10/31/2012   ALT 10 08/17/2014   AST 15 08/17/2014   NA 142 08/17/2014   K 4.3 08/17/2014   CL 104 08/17/2014   CREATININE 0.86 08/17/2014   BUN 6 08/17/2014   CO2 29 08/17/2014   TSH 0.187* 11/23/2009   INR 1.0 10/13/2013   08/17/14 abd CT IMPRESSION: 1. Nonobstructing right renal calculus, no obstructive uropathy. Calcification in the left pelvis is adjacent to the ureter and represents a phlebolith, unchanged from prior exam. No left hydronephrosis. 2. Diffuse urinary bladder thickening and suggestion of mild perivesicular inflammatory change. The findings suggest cystitis, correlation with urinalysis recommended. 3. Suggestion of extrahepatic biliary ductal dilatation, and possible pancreatic ductal dilatation, incompletely characterized without contrast. Recommend correlation with LFTs. Nonemergent MRCP and MRI of the pancreas recommended further evaluation.  Electronically Signed  By: Jeb Levering M.D.  On: 08/17/2014 21:33     Assessment & Plan:

## 2014-08-26 LAB — CULTURE, URINE COMPREHENSIVE
Colony Count: NO GROWTH
Organism ID, Bacteria: NO GROWTH

## 2014-08-31 ENCOUNTER — Emergency Department (HOSPITAL_COMMUNITY): Payer: Medicare Other

## 2014-08-31 ENCOUNTER — Encounter (HOSPITAL_COMMUNITY): Payer: Self-pay | Admitting: Emergency Medicine

## 2014-08-31 ENCOUNTER — Encounter: Payer: Self-pay | Admitting: Internal Medicine

## 2014-08-31 ENCOUNTER — Inpatient Hospital Stay (HOSPITAL_COMMUNITY)
Admission: EM | Admit: 2014-08-31 | Discharge: 2014-09-04 | DRG: 195 | Disposition: A | Discharge status: Home / Self Care | Payer: Medicare Other | Attending: Internal Medicine | Admitting: Internal Medicine

## 2014-08-31 ENCOUNTER — Ambulatory Visit (INDEPENDENT_AMBULATORY_CARE_PROVIDER_SITE_OTHER): Payer: Medicare Other | Admitting: Internal Medicine

## 2014-08-31 VITALS — BP 126/88 | HR 73 | Temp 97.5°F | Resp 18 | Ht 65.5 in | Wt 137.0 lb

## 2014-08-31 DIAGNOSIS — J181 Lobar pneumonia, unspecified organism: Secondary | ICD-10-CM | POA: Diagnosis not present

## 2014-08-31 DIAGNOSIS — I5189 Other ill-defined heart diseases: Secondary | ICD-10-CM | POA: Diagnosis present

## 2014-08-31 DIAGNOSIS — R310 Gross hematuria: Secondary | ICD-10-CM | POA: Diagnosis not present

## 2014-08-31 DIAGNOSIS — I10 Essential (primary) hypertension: Secondary | ICD-10-CM | POA: Diagnosis present

## 2014-08-31 DIAGNOSIS — Z8249 Family history of ischemic heart disease and other diseases of the circulatory system: Secondary | ICD-10-CM

## 2014-08-31 DIAGNOSIS — J189 Pneumonia, unspecified organism: Secondary | ICD-10-CM

## 2014-08-31 DIAGNOSIS — K210 Gastro-esophageal reflux disease with esophagitis: Secondary | ICD-10-CM | POA: Diagnosis present

## 2014-08-31 DIAGNOSIS — R0602 Shortness of breath: Secondary | ICD-10-CM

## 2014-08-31 DIAGNOSIS — Z880 Allergy status to penicillin: Secondary | ICD-10-CM

## 2014-08-31 DIAGNOSIS — Z8701 Personal history of pneumonia (recurrent): Secondary | ICD-10-CM

## 2014-08-31 DIAGNOSIS — Z825 Family history of asthma and other chronic lower respiratory diseases: Secondary | ICD-10-CM

## 2014-08-31 DIAGNOSIS — E876 Hypokalemia: Secondary | ICD-10-CM | POA: Diagnosis present

## 2014-08-31 DIAGNOSIS — B373 Candidiasis of vulva and vagina: Secondary | ICD-10-CM | POA: Diagnosis present

## 2014-08-31 DIAGNOSIS — R197 Diarrhea, unspecified: Secondary | ICD-10-CM | POA: Diagnosis not present

## 2014-08-31 DIAGNOSIS — F1120 Opioid dependence, uncomplicated: Secondary | ICD-10-CM | POA: Diagnosis present

## 2014-08-31 DIAGNOSIS — M542 Cervicalgia: Secondary | ICD-10-CM | POA: Diagnosis present

## 2014-08-31 DIAGNOSIS — Z87440 Personal history of urinary (tract) infections: Secondary | ICD-10-CM

## 2014-08-31 DIAGNOSIS — F329 Major depressive disorder, single episode, unspecified: Secondary | ICD-10-CM | POA: Diagnosis present

## 2014-08-31 DIAGNOSIS — M549 Dorsalgia, unspecified: Secondary | ICD-10-CM

## 2014-08-31 DIAGNOSIS — G8929 Other chronic pain: Secondary | ICD-10-CM | POA: Diagnosis present

## 2014-08-31 DIAGNOSIS — K219 Gastro-esophageal reflux disease without esophagitis: Secondary | ICD-10-CM | POA: Diagnosis present

## 2014-08-31 DIAGNOSIS — E860 Dehydration: Secondary | ICD-10-CM

## 2014-08-31 DIAGNOSIS — J180 Bronchopneumonia, unspecified organism: Secondary | ICD-10-CM

## 2014-08-31 DIAGNOSIS — E785 Hyperlipidemia, unspecified: Secondary | ICD-10-CM | POA: Diagnosis present

## 2014-08-31 LAB — URINALYSIS, ROUTINE W REFLEX MICROSCOPIC
Glucose, UA: NEGATIVE mg/dL
Ketones, ur: >80 mg/dL — AB
Nitrite: NEGATIVE
PH: 6 (ref 5.0–8.0)
Protein, ur: 100 mg/dL — AB
Specific Gravity, Urine: 1.024 (ref 1.005–1.030)
Urobilinogen, UA: 1 mg/dL (ref 0.0–1.0)

## 2014-08-31 LAB — URINE MICROSCOPIC-ADD ON

## 2014-08-31 LAB — CBC WITH DIFFERENTIAL/PLATELET
BASOS PCT: 0 % (ref 0–1)
Basophils Absolute: 0 10*3/uL (ref 0.0–0.1)
EOS PCT: 0 % (ref 0–5)
Eosinophils Absolute: 0.1 10*3/uL (ref 0.0–0.7)
HEMATOCRIT: 36.9 % (ref 36.0–46.0)
HEMOGLOBIN: 12.1 g/dL (ref 12.0–15.0)
LYMPHS PCT: 11 % — AB (ref 12–46)
Lymphs Abs: 1.4 10*3/uL (ref 0.7–4.0)
MCH: 26.5 pg (ref 26.0–34.0)
MCHC: 32.8 g/dL (ref 30.0–36.0)
MCV: 80.7 fL (ref 78.0–100.0)
Monocytes Absolute: 0.5 10*3/uL (ref 0.1–1.0)
Monocytes Relative: 4 % (ref 3–12)
Neutro Abs: 10.7 10*3/uL — ABNORMAL HIGH (ref 1.7–7.7)
Neutrophils Relative %: 85 % — ABNORMAL HIGH (ref 43–77)
Platelets: 238 10*3/uL (ref 150–400)
RBC: 4.57 MIL/uL (ref 3.87–5.11)
RDW: 14.2 % (ref 11.5–15.5)
WBC: 12.7 10*3/uL — ABNORMAL HIGH (ref 4.0–10.5)

## 2014-08-31 LAB — COMPREHENSIVE METABOLIC PANEL
ALBUMIN: 3.6 g/dL (ref 3.5–5.2)
ALT: 12 U/L (ref 0–35)
AST: 21 U/L (ref 0–37)
Alkaline Phosphatase: 97 U/L (ref 39–117)
Anion gap: 11 (ref 5–15)
BUN: 13 mg/dL (ref 6–23)
CO2: 25 mmol/L (ref 19–32)
CREATININE: 0.67 mg/dL (ref 0.50–1.10)
Calcium: 8.9 mg/dL (ref 8.4–10.5)
Chloride: 101 mmol/L (ref 96–112)
GFR calc Af Amer: >90 mL/min (ref >90)
GFR calc non Af Amer: 90 mL/min — ABNORMAL LOW (ref >90)
Glucose, Bld: 80 mg/dL (ref 70–99)
Potassium: 3.3 mmol/L — ABNORMAL LOW (ref 3.5–5.1)
SODIUM: 137 mmol/L (ref 135–145)
Total Bilirubin: 1.8 mg/dL — ABNORMAL HIGH (ref 0.3–1.2)
Total Protein: 7 g/dL (ref 6.0–8.3)

## 2014-08-31 MED ORDER — DEXTROSE 5 % IV SOLN
1.0000 g | Freq: Once | INTRAVENOUS | Status: AC
  Administered 2014-08-31: 1 g via INTRAVENOUS
  Filled 2014-08-31: qty 10

## 2014-08-31 MED ORDER — ONDANSETRON HCL 4 MG/2ML IJ SOLN
4.0000 mg | Freq: Once | INTRAMUSCULAR | Status: AC
  Administered 2014-08-31: 4 mg via INTRAMUSCULAR

## 2014-08-31 MED ORDER — OXYCODONE-ACETAMINOPHEN 5-325 MG PO TABS
2.0000 | ORAL_TABLET | Freq: Once | ORAL | Status: AC
  Administered 2014-08-31: 2 via ORAL
  Filled 2014-08-31: qty 2

## 2014-08-31 MED ORDER — SODIUM CHLORIDE 0.9 % IV BOLUS (SEPSIS)
1000.0000 mL | Freq: Once | INTRAVENOUS | Status: AC
  Administered 2014-08-31: 1000 mL via INTRAVENOUS

## 2014-08-31 MED ORDER — POTASSIUM CHLORIDE CRYS ER 20 MEQ PO TBCR
40.0000 meq | EXTENDED_RELEASE_TABLET | Freq: Once | ORAL | Status: AC
  Administered 2014-09-01: 40 meq via ORAL
  Filled 2014-08-31: qty 2

## 2014-08-31 MED ORDER — DEXTROSE 5 % IV SOLN
500.0000 mg | Freq: Once | INTRAVENOUS | Status: AC
  Administered 2014-09-01: 500 mg via INTRAVENOUS
  Filled 2014-08-31: qty 500

## 2014-08-31 NOTE — Progress Notes (Signed)
Pre visit review using our clinic review tool, if applicable. No additional management support is needed unless otherwise documented below in the visit note. 

## 2014-08-31 NOTE — Assessment & Plan Note (Signed)
With another episode gross hematuria yesterday, possible recurrent which might explain the diarrheal illness as well, consider IV antibx

## 2014-08-31 NOTE — Assessment & Plan Note (Signed)
With repeat episode yesterday x 1, ? Recurrence of UTI, consider hospn for empiric tx UTI as lives alone, may have difficulty taking po meds

## 2014-08-31 NOTE — ED Notes (Signed)
Patient transported to X-ray.  Therefore, orthostatics delayed.

## 2014-08-31 NOTE — ED Notes (Signed)
Pt called for vital sign recheck, no reply

## 2014-08-31 NOTE — Assessment & Plan Note (Signed)
Resolved today by hx and exam,  to f/u any worsening symptoms or concerns

## 2014-08-31 NOTE — ED Provider Notes (Signed)
CSN: [id number]     Arrival date & time 08/31/14  1707 History   First MD Initiated Contact with Patient 08/31/14 2144     Chief Complaint  Patient presents with  . Diarrhea  . Dehydration     (Consider location/radiation/quality/duration/timing/severity/associated sxs/prior Treatment) Patient is a 64 y.o. female presenting with diarrhea. The history is provided by the patient and medical records. No language interpreter was used.  Diarrhea Associated symptoms: no abdominal pain, no diaphoresis, no fever, no headaches and no vomiting      Bianca Haley is a 64 y.o. female  with a hx of HTN, back pain, anemia, recurrent UTI, endometriosis, CKD, recurrent PNA (attrobuted to GERD) presents to the Emergency Department complaining of gradual, persistent, progressively worsening diarrhea onset today.  Pt reports she had urinary frequency and hematuria last week.  She reports she was treated with an abx that caused a rash.  She reports she was then given prednisone and a different antibiotic (macrobid) that she did not fill until today.  She reports that she filled the the [facility] today, took one dose which caused watery diarrhea x3.  Pt reports nausea for the last 4 days and decreased appetite.  She denies melena and hematochezia.  Pt reports she has been taking probiotics to prevent diarrhea with the antibiotics.  Nothing makes it better and Nothing makes it worse.  Pt denies fever, chills, headache, neck pain, chest pain, abdominal pain, vomiting, dizziness, syncope, dysuria, frequency or urgency.  Pt reports PNA x4 this year (likely 2/2 GERD). Ppt reports she aches all over and sometimes feels SOB, but not currently.   She also endorses persistent cough worsening in the last several days. She reports generally feeling ill.  Last hospitalization in Sept 2015 in [place].     Past Medical History  Diagnosis Date  . Hypertension   . Back pain   . Headache(784.0)   . Migraine   . Anemia   .  Anxiety   . History of UTI   . History of endometriosis   . History of uterine fibroid   . H/O tinnitus     left  . GERD (gastroesophageal reflux disease)   . Chronic renal failure   . Recurrent pneumonia    Past Surgical History  Procedure Laterality Date  . Abdominal hysterectomy    . Bladder suspension    . Back surgery    . Colonoscopy  02/16/2008    normal  . Laparoscopy      for evauation of endometriosis   Family History  Problem Relation Age of Onset  . Heart disease Mother   . Emphysema Mother    History  Substance Use Topics  . Smoking status: Never Smoker   . Smokeless tobacco: Never Used  . Alcohol Use: No     Comment: no drinking for sevral months   OB History    Gravida Para Term Preterm AB TAB SAB Ectopic Multiple Living   2 2 2   0 0 0 0 0 2     Review of Systems  Constitutional: Positive for fatigue. Negative for fever, diaphoresis, appetite change and unexpected weight change.  HENT: Negative for mouth sores.   Eyes: Negative for visual disturbance.  Respiratory: Positive for shortness of breath. Negative for cough, chest tightness and wheezing.   Cardiovascular: Negative for chest pain.  Gastrointestinal: Positive for diarrhea. Negative for nausea, vomiting, abdominal pain and constipation.  Endocrine: Negative for polydipsia, polyphagia and polyuria.  Genitourinary: Negative  for dysuria, urgency, frequency and hematuria.  Musculoskeletal: Negative for back pain and neck stiffness.  Skin: Negative for rash.  Allergic/Immunologic: Negative for immunocompromised state.  Neurological: Negative for syncope, light-headedness and headaches.  Hematological: Does not bruise/bleed easily.  Psychiatric/Behavioral: Negative for sleep disturbance. The patient is not nervous/anxious.       Allergies  Penicillins and Morphine and related  Home Medications   Prior to Admission medications   Medication Sig Start Date End Date Taking? Authorizing  Provider  albuterol (PROVENTIL HFA;VENTOLIN HFA) 108 (90 BASE) MCG/ACT inhaler 2 puffs every 4-6 hours as needed for wheezing/shortness of breath 06/19/14  Yes Tammy S Parrett, NP  ASTRAGALUS PO Take 2 tablets by mouth 2 (two) times daily.    Yes Historical Provider, MD  atenolol (TENORMIN) 100 MG tablet Take 100 mg by mouth daily.   Yes Historical Provider, MD  cholecalciferol (VITAMIN D) 1000 UNITS tablet Take 1 tablet (1,000 Units total) by mouth daily. 10/13/13 10/13/14 Yes Aleksei Plotnikov V, MD  diazepam (VALIUM) 5 MG tablet Take 5 mg by mouth every 12 (twelve) hours as needed for anxiety or muscle spasms.  08/07/14  Yes Historical Provider, MD  diltiazem (TIAZAC) 180 MG 24 hr capsule Take 1 capsule (180 mg total) by mouth daily. 12/07/13  Yes Aleksei Plotnikov V, MD  gabapentin (NEURONTIN) 300 MG capsule Take 2 capsules by mouth at bedtime.  01/09/14  Yes Historical Provider, MD  hydrochlorothiazide (MICROZIDE) 12.5 MG capsule Take 1 capsule (12.5 mg total) by mouth daily. Patient taking differently: Take 12.5 mg by mouth daily as needed (for leg swelling).  08/24/14  Yes Aleksei Plotnikov V, MD  oxymorphone (OPANA) 10 MG tablet Take 10 mg by mouth daily as needed for pain. Morning or afternoon 09/29/13  Yes Samella Parr, NP  pantoprazole (PROTONIX) 40 MG tablet Take 1 tablet (40 mg total) by mouth 2 (two) times daily. 10/25/13  Yes Aleksei Plotnikov V, MD  PARoxetine (PAXIL) 40 MG tablet TAKE 1 TABLET (40 MG TOTAL) BY MOUTH 2 (TWO) TIMES DAILY. 05/03/14  Yes Aleksei Plotnikov V, MD  simvastatin (ZOCOR) 40 MG tablet TAKE 1/2 TABLET BY MOUTH ONCE DAILY 07/10/14  Yes Aleksei Plotnikov V, MD  SUMAtriptan (IMITREX) 100 MG tablet Take 1 tablet (100 mg total) by mouth once. May repeat in 2 hours if headache persists or recurs. 08/21/14  Yes Aleksei Plotnikov V, MD  traZODone (DESYREL) 100 MG tablet TAKE 1 TABLET BY MOUTH AT BEDTIME 06/15/14  Yes Aleksei Plotnikov V, MD  ALPRAZolam (XANAX) 0.25 MG tablet We  recommend using 1/2 or your 0.5 mg tabs every 6 hours as needed for anxiety Patient taking differently: Take 0.25 mg by mouth every 6 (six) hours as needed for anxiety.  09/29/13   Samella Parr, NP  nitrofurantoin, macrocrystal-monohydrate, (MACROBID) 100 MG capsule Take 1 capsule (100 mg total) by mouth 2 (two) times daily. Patient not taking: Reported on 08/31/2014 08/24/14   Aleksei Plotnikov V, MD   BP 164/76 mmHg  Pulse 71  Temp(Src) 98.2 F (36.8 C) (Oral)  Resp 18  SpO2 99% Physical Exam  Constitutional: She appears well-developed and well-nourished. No distress.  Awake, alert, nontoxic appearance  HENT:  Head: Normocephalic and atraumatic.  Mouth/Throat: Oropharynx is clear and moist. No oropharyngeal exudate.  Eyes: Conjunctivae are normal. No scleral icterus.  Neck: Normal range of motion. Neck supple.  Cardiovascular: Normal rate, regular rhythm, normal heart sounds and intact distal pulses.   No murmur heard. Pulmonary/Chest: Effort  normal and breath sounds normal. No respiratory distress. She has no wheezes.  Equal chest expansion  Abdominal: Soft. Bowel sounds are normal. She exhibits no mass. There is no tenderness. There is no rebound and no guarding.  Musculoskeletal: Normal range of motion. She exhibits no edema.  Neurological: She is alert.  Speech is clear and goal oriented Moves extremities without ataxia  Skin: Skin is warm and dry. She is not diaphoretic.  Psychiatric: She has a normal mood and affect.  Nursing note and vitals reviewed.   ED Course  Procedures (including critical care time) Labs Review Labs Reviewed  CBC WITH DIFFERENTIAL/PLATELET - Abnormal; Notable for the following:    WBC 12.7 (*)    Neutrophils Relative % 85 (*)    Neutro Abs 10.7 (*)    Lymphocytes Relative 11 (*)    All other components within normal limits  COMPREHENSIVE METABOLIC PANEL - Abnormal; Notable for the following:    Potassium 3.3 (*)    Total Bilirubin 1.8 (*)     GFR calc non Af Amer 90 (*)    All other components within normal limits  URINALYSIS, ROUTINE W REFLEX MICROSCOPIC - Abnormal; Notable for the following:    Color, Urine AMBER (*)    APPearance CLOUDY (*)    Hgb urine dipstick TRACE (*)    Bilirubin Urine MODERATE (*)    Ketones, ur >80 (*)    Protein, ur 100 (*)    Leukocytes, UA TRACE (*)    All other components within normal limits  URINE MICROSCOPIC-ADD ON - Abnormal; Notable for the following:    Squamous Epithelial / LPF FEW (*)    All other components within normal limits  STOOL CULTURE  OVA AND PARASITE EXAMINATION  URINE CULTURE  GI PATHOGEN PANEL BY PCR, STOOL    Imaging Review Dg Chest 2 View  08/31/2014   CLINICAL DATA:  Shortness of breath for 2 weeks, cough, history of pneumonia. Recent urinary tract infection. History of chronic renal failure and recurrent pneumonia.  EXAM: CHEST  2 VIEW  COMPARISON:  Chest radiograph March 19, 2014  FINDINGS: RIGHT lower lobe alveolar airspace opacity with mild interstitial prominence throughout the RIGHT lung. No pleural effusion. Cardiac silhouette is upper limits of normal in size, mediastinal silhouette is nonsuspicious. No pneumothorax. Soft tissue planes and included osseous structure nonsuspicious, scoliosis.  IMPRESSION: RIGHT lower lobe bronchopneumonia. Followup PA and lateral chest X-ray is recommended in 3-4 weeks following trial of antibiotic therapy to ensure resolution and exclude underlying malignancy.  Borderline cardiomegaly.   Electronically Signed   By: Elon Alas   On: 08/31/2014 22:44     EKG Interpretation None      MDM   Final diagnoses:  SOB (shortness of breath)  Bronchopneumonia  Diarrhea  Dehydration  Hypokalemia  Recurrent pneumonia   Bianca Haley presents with multiple complaints. She endorses explosive diarrhea today. Patient's been on numerous antibiotics in the last several months treating both pneumonia and urinary tract  infections. Concern for C. difficile. Samples sent. Patient also complaining of dehydration and a UA with greater than 80 ketones and increased specific gravity. Patient is receiving fluids for this.  Lung exam is without diminishment or focal lung sounds however chest x-ray shows bronchopneumonia and patient endorses worsening cough.  Patient has been many months since her last hospitalization at this point she has failed outpatient treatment for her community-acquired pneumonia. Will begin IV Rocephin and azithromycin.  The patient was discussed with  and seen by Dr. Winfred Leeds who agrees with the treatment plan.  11:52 PM Pt discussed with Dr. Darnell Level who will admit to tele.  Pt is without hypoxia in the ED.  Hypokalemia repleted in the ED.    BP 164/76 mmHg  Pulse 71  Temp(Src) 98.2 F (36.8 C) (Oral)  Resp 18  SpO2 99%   Abigail Butts, PA-C 08/31/14 [place], MD 09/01/14 [phone]

## 2014-08-31 NOTE — Assessment & Plan Note (Signed)
stable overall by history and exam, and pt to continue medical treatment as before,  to f/u any worsening symptoms or concerns 

## 2014-08-31 NOTE — Assessment & Plan Note (Addendum)
Recurrent non bloody today apparently after recent ? antibx related diarrhea last wk, occurred today after one dose nitrofurantoin for ? recurrent UTI, in the setting of recent mult antibx for PNA and UTI.   Now/today with marked general weakness, sleepiness/fatigue, and ? mild confusion vs illness related cognitive slowing; I d/w female friend and pt who agree that in this setting she will need to be evaluated  for possible dehydration, and checked for electrolyte abnormality, and cbc .  If requires hospn, I suspect should be checked for c diff , though abd exam not terribly worrisome, and pt afeb.  Gave zofran IM for nausea.  Asked pt to present to ER from this office, female friend will drive her.

## 2014-08-31 NOTE — ED Notes (Signed)
Pt c/o diarrhea x 2 days. Pt also c/o loss of appetite and fatigue. Pt sts that she has had pneumonia 4 times this year. Pt sts when she starts to feel "crackley" her MD calls in antibiotics for her. Pt sts that she is also feeling crackles in her chest. Pt is clear to auscultation. Pt was seen here a week and a half ago for a bladder infection and placed on antibiotics. Pt sts the antibiotics had penicillin in it and it made her break out into hives. Pt sts hives have been treated. Pt went to PCP today and PCP started her on a new antibiotic, nitrofurantoin, today. PCP thought pt. Might be dehydrated and sent her over here. Denies Chest pain, SOB. Pt and friend seem to be poor historians and it is difficult o figure out what pt chief complaints are. A&Ox4 and ambulatory.

## 2014-08-31 NOTE — Patient Instructions (Signed)
You had the nausea shot in the office today  Please continue all other medications as before, except stop the nitrofurantoin  Please have the pharmacy call with any other refills you may need.  Please keep your appointments with your specialists as you may have planned  Please go directly to the ER now, as you will need to be checked and treated for possible dehydration and/or electrolyte abnormality

## 2014-08-31 NOTE — Progress Notes (Signed)
Subjective:    Patient ID: Bianca Haley, female    DOB: 1950/12/16, 64 y.o.   MRN: [id number]  HPI  Here with somewhat complicated recent hx 1-2 wks episode of apparently successfully treated CAP and UTI as outpt with antibx (treatment also complicated by hives episode with PCN based antibx), Overall tx also complicated by recurring diarrhea last wk which was markedly improved in the last few days, until today when took one dose of nitrofurantoin tx per PCP ( for probable recurrent UTI after gross hematuria noted again yesterday per pt.)  Has had several episodes non bloody diarrhea without worsening pain or vomiting.  Overall feels terrible, generally weak, prefers to lie down rather than sit for exam, and mild confusion not present until today.  Pt accompanied by female friend who is very close, but pt lives alone, and he is supportive, states he could stay with her the weekend if needed at her home Past Medical History  Diagnosis Date  . Hypertension   . Back pain   . Headache(784.0)   . Migraine   . Anemia   . Anxiety   . History of UTI   . History of endometriosis   . History of uterine fibroid   . H/O tinnitus     left  . GERD (gastroesophageal reflux disease)   . Chronic renal failure   . Recurrent pneumonia    Past Surgical History  Procedure Laterality Date  . Abdominal hysterectomy    . Bladder suspension    . Back surgery    . Colonoscopy  02/16/2008    normal  . Laparoscopy      for evauation of endometriosis    reports that she has never smoked. She has never used smokeless tobacco. She reports that she does not drink alcohol or use illicit drugs. family history includes Emphysema in her mother; Heart disease in her mother. Allergies  Allergen Reactions  . Penicillins Shortness Of Breath and Rash  . Morphine And Related Swelling    Severe pain Morphine only; other opioids OK   Current Outpatient Prescriptions on File Prior to Visit  Medication Sig Dispense  Refill  . albuterol (PROVENTIL HFA;VENTOLIN HFA) 108 (90 BASE) MCG/ACT inhaler 2 puffs every 4-6 hours as needed for wheezing/shortness of breath 8 g 5  . ALPRAZolam (XANAX) 0.25 MG tablet We recommend using 1/2 or your 0.5 mg tabs every 6 hours as needed for anxiety 30 tablet 0  . ASTRAGALUS PO Take 2 tablets by mouth 2 (two) times daily.     [name] atenolol (TENORMIN) 100 MG tablet Take 100 mg by mouth daily.    . cholecalciferol (VITAMIN D) 1000 UNITS tablet Take 1 tablet (1,000 Units total) by mouth daily. 100 tablet 3  . diazepam (VALIUM) 5 MG tablet Take 5 mg by mouth 2 (two) times daily.  2  . diltiazem (TIAZAC) 180 MG 24 hr capsule Take 1 capsule (180 mg total) by mouth daily. 180 capsule 3  . gabapentin (NEURONTIN) 300 MG capsule Take 2 capsules by mouth at bedtime.     . hydrochlorothiazide (MICROZIDE) 12.5 MG capsule Take 1 capsule (12.5 mg total) by mouth daily. 90 capsule 0  . nitrofurantoin, macrocrystal-monohydrate, (MACROBID) 100 MG capsule Take 1 capsule (100 mg total) by mouth 2 (two) times daily. 14 capsule 0  . oxymorphone (OPANA) 10 MG tablet Take 10 mg by mouth daily as needed for pain.     . pantoprazole (PROTONIX) 40 MG tablet Take 1  tablet (40 mg total) by mouth 2 (two) times daily. 60 tablet 11  . PARoxetine (PAXIL) 40 MG tablet TAKE 1 TABLET (40 MG TOTAL) BY MOUTH 2 (TWO) TIMES DAILY. 60 tablet 5  . simvastatin (ZOCOR) 40 MG tablet TAKE 1/2 TABLET BY MOUTH ONCE DAILY 45 tablet 3  . SUMAtriptan (IMITREX) 100 MG tablet Take 1 tablet (100 mg total) by mouth once. May repeat in 2 hours if headache persists or recurs. 12 tablet 5  . traZODone (DESYREL) 100 MG tablet TAKE 1 TABLET BY MOUTH AT BEDTIME 30 tablet 5  . [DISCONTINUED] diltiazem (CARDIZEM CD) 180 MG 24 hr capsule TAKE ONE CAPSULE EVERY DAY 30 capsule 5   No current facility-administered medications on file prior to visit.   Review of Systems  Constitutional: Negative for unusual diaphoresis or night sweats HENT:  Negative for ringing in ear or discharge Eyes: Negative for double vision or worsening visual disturbance.  Respiratory: Negative for choking and stridor.  .  Musculoskeletal: Negative for other MSK pain or swelling Skin: Negative for color change and worsening wound.  Neurological: Negative for tremors and numbness other than noted  Psychiatric/Behavioral: Negative for agitation other than above       Objective:   Physical Exam BP 126/88 mmHg  Pulse 73  Temp(Src) 97.5 F (36.4 C) (Oral)  Resp 18  Ht 5' 5.5" (1.664 m)  Wt 137 lb (62.143 kg)  BMI 22.44 kg/m2  SpO2 95% VS noted, appears weak, mild to mod ill apearing, fatigued Constitutional: Pt appears in no significant distress HENT: Head: NCAT.  Right Ear: External ear normal.  Left Ear: External ear normal.  Eyes: . Pupils are equal, round, and reactive to light. Conjunctivae and EOM are normal Neck: Normal range of motion. Neck supple.  Cardiovascular: Normal rate and regular rhythm.   Pulmonary/Chest: Effort normal and breath sounds without rales or wheezing.  Abd:  Soft, NT, ND, + BS Neurological: Pt is alert. mild confused noted, motor grossly intact Skin: Skin is warm. No rash, no LE edema Psychiatric: No agitation.     Assessment & Plan:

## 2014-09-01 ENCOUNTER — Inpatient Hospital Stay (HOSPITAL_COMMUNITY): Payer: Medicare Other

## 2014-09-01 ENCOUNTER — Encounter (HOSPITAL_COMMUNITY): Payer: Self-pay | Admitting: Internal Medicine

## 2014-09-01 DIAGNOSIS — R197 Diarrhea, unspecified: Secondary | ICD-10-CM | POA: Diagnosis not present

## 2014-09-01 DIAGNOSIS — F329 Major depressive disorder, single episode, unspecified: Secondary | ICD-10-CM | POA: Diagnosis present

## 2014-09-01 DIAGNOSIS — J181 Lobar pneumonia, unspecified organism: Secondary | ICD-10-CM | POA: Diagnosis not present

## 2014-09-01 DIAGNOSIS — F1920 Other psychoactive substance dependence, uncomplicated: Secondary | ICD-10-CM | POA: Diagnosis not present

## 2014-09-01 DIAGNOSIS — B373 Candidiasis of vulva and vagina: Secondary | ICD-10-CM | POA: Diagnosis present

## 2014-09-01 DIAGNOSIS — E860 Dehydration: Secondary | ICD-10-CM | POA: Diagnosis not present

## 2014-09-01 DIAGNOSIS — Z880 Allergy status to penicillin: Secondary | ICD-10-CM | POA: Diagnosis not present

## 2014-09-01 DIAGNOSIS — Z8701 Personal history of pneumonia (recurrent): Secondary | ICD-10-CM | POA: Diagnosis not present

## 2014-09-01 DIAGNOSIS — J189 Pneumonia, unspecified organism: Secondary | ICD-10-CM | POA: Diagnosis not present

## 2014-09-01 DIAGNOSIS — Z8249 Family history of ischemic heart disease and other diseases of the circulatory system: Secondary | ICD-10-CM | POA: Diagnosis not present

## 2014-09-01 DIAGNOSIS — I10 Essential (primary) hypertension: Secondary | ICD-10-CM | POA: Diagnosis not present

## 2014-09-01 DIAGNOSIS — J180 Bronchopneumonia, unspecified organism: Secondary | ICD-10-CM | POA: Diagnosis not present

## 2014-09-01 DIAGNOSIS — Z825 Family history of asthma and other chronic lower respiratory diseases: Secondary | ICD-10-CM | POA: Diagnosis not present

## 2014-09-01 DIAGNOSIS — E876 Hypokalemia: Secondary | ICD-10-CM

## 2014-09-01 DIAGNOSIS — E785 Hyperlipidemia, unspecified: Secondary | ICD-10-CM | POA: Diagnosis present

## 2014-09-01 DIAGNOSIS — Z87440 Personal history of urinary (tract) infections: Secondary | ICD-10-CM | POA: Diagnosis not present

## 2014-09-01 DIAGNOSIS — K210 Gastro-esophageal reflux disease with esophagitis: Secondary | ICD-10-CM | POA: Diagnosis present

## 2014-09-01 LAB — COMPREHENSIVE METABOLIC PANEL
ALT: 10 U/L (ref 0–35)
ANION GAP: 9 (ref 5–15)
AST: 17 U/L (ref 0–37)
Albumin: 3.1 g/dL — ABNORMAL LOW (ref 3.5–5.2)
Alkaline Phosphatase: 82 U/L (ref 39–117)
BUN: 11 mg/dL (ref 6–23)
CALCIUM: 8.7 mg/dL (ref 8.4–10.5)
CO2: 27 mmol/L (ref 19–32)
Chloride: 105 mmol/L (ref 96–112)
Creatinine, Ser: 0.56 mg/dL (ref 0.50–1.10)
GFR calc non Af Amer: >90 mL/min (ref >90)
GLUCOSE: 69 mg/dL — AB (ref 70–99)
Potassium: 3.7 mmol/L (ref 3.5–5.1)
SODIUM: 141 mmol/L (ref 135–145)
TOTAL PROTEIN: 6.1 g/dL (ref 6.0–8.3)
Total Bilirubin: 1 mg/dL (ref 0.3–1.2)

## 2014-09-01 LAB — CBC
HEMATOCRIT: 32.3 % — AB (ref 36.0–46.0)
Hemoglobin: 10.3 g/dL — ABNORMAL LOW (ref 12.0–15.0)
MCH: 25.8 pg — ABNORMAL LOW (ref 26.0–34.0)
MCHC: 31.9 g/dL (ref 30.0–36.0)
MCV: 81 fL (ref 78.0–100.0)
Platelets: 193 10*3/uL (ref 150–400)
RBC: 3.99 MIL/uL (ref 3.87–5.11)
RDW: 14.2 % (ref 11.5–15.5)
WBC: 9 10*3/uL (ref 4.0–10.5)

## 2014-09-01 LAB — CLOSTRIDIUM DIFFICILE BY PCR: Toxigenic C. Difficile by PCR: NEGATIVE

## 2014-09-01 LAB — STREP PNEUMONIAE URINARY ANTIGEN: Strep Pneumo Urinary Antigen: NEGATIVE

## 2014-09-01 LAB — MRSA PCR SCREENING: MRSA by PCR: NEGATIVE

## 2014-09-01 MED ORDER — BUTALBITAL-APAP-CAFFEINE 50-325-40 MG PO TABS
1.0000 | ORAL_TABLET | ORAL | Status: DC | PRN
  Administered 2014-09-01 – 2014-09-04 (×3): 1 via ORAL
  Filled 2014-09-01 (×3): qty 1

## 2014-09-01 MED ORDER — OXYCODONE HCL 5 MG PO TABS
10.0000 mg | ORAL_TABLET | ORAL | Status: DC | PRN
  Administered 2014-09-01 – 2014-09-04 (×6): 10 mg via ORAL
  Filled 2014-09-01 (×7): qty 2

## 2014-09-01 MED ORDER — DILTIAZEM HCL ER COATED BEADS 180 MG PO CP24
180.0000 mg | ORAL_CAPSULE | Freq: Every day | ORAL | Status: DC
  Administered 2014-09-01 – 2014-09-04 (×4): 180 mg via ORAL
  Filled 2014-09-01 (×6): qty 1

## 2014-09-01 MED ORDER — ATENOLOL 100 MG PO TABS
100.0000 mg | ORAL_TABLET | Freq: Every day | ORAL | Status: DC
  Administered 2014-09-01 – 2014-09-04 (×4): 100 mg via ORAL
  Filled 2014-09-01 (×4): qty 1

## 2014-09-01 MED ORDER — CEFTRIAXONE SODIUM IN DEXTROSE 20 MG/ML IV SOLN
1.0000 g | INTRAVENOUS | Status: DC
  Administered 2014-09-01 – 2014-09-03 (×3): 1 g via INTRAVENOUS
  Filled 2014-09-01 (×3): qty 50

## 2014-09-01 MED ORDER — POTASSIUM CHLORIDE 10 MEQ/100ML IV SOLN
10.0000 meq | INTRAVENOUS | Status: AC
  Administered 2014-09-01 (×2): 10 meq via INTRAVENOUS
  Filled 2014-09-01 (×2): qty 100

## 2014-09-01 MED ORDER — IOHEXOL 300 MG/ML  SOLN
100.0000 mL | Freq: Once | INTRAMUSCULAR | Status: AC | PRN
  Administered 2014-09-01: 100 mL via INTRAVENOUS

## 2014-09-01 MED ORDER — SODIUM CHLORIDE 0.9 % IV SOLN
INTRAVENOUS | Status: AC
Start: 2014-09-01 — End: 2014-09-01
  Administered 2014-09-01: 05:00:00 via INTRAVENOUS

## 2014-09-01 MED ORDER — PAROXETINE HCL 20 MG PO TABS
40.0000 mg | ORAL_TABLET | Freq: Two times a day (BID) | ORAL | Status: DC
  Administered 2014-09-01 – 2014-09-04 (×7): 40 mg via ORAL
  Filled 2014-09-01 (×7): qty 2

## 2014-09-01 MED ORDER — GABAPENTIN 300 MG PO CAPS
600.0000 mg | ORAL_CAPSULE | Freq: Every day | ORAL | Status: DC
  Administered 2014-09-01 – 2014-09-03 (×3): 600 mg via ORAL
  Filled 2014-09-01 (×3): qty 2

## 2014-09-01 MED ORDER — ALBUTEROL SULFATE (2.5 MG/3ML) 0.083% IN NEBU: 3.0000 mL | INHALATION_SOLUTION | RESPIRATORY_TRACT | Status: DC | PRN

## 2014-09-01 MED ORDER — SIMVASTATIN 20 MG PO TABS: 20.0000 mg | ORAL_TABLET | Freq: Every day | ORAL | Status: DC

## 2014-09-01 MED ORDER — ATORVASTATIN CALCIUM 10 MG PO TABS
10.0000 mg | ORAL_TABLET | Freq: Every day | ORAL | Status: DC
  Administered 2014-09-01 – 2014-09-03 (×3): 10 mg via ORAL
  Filled 2014-09-01 (×4): qty 1

## 2014-09-01 MED ORDER — DEXTROSE 5 % IV SOLN
500.0000 mg | INTRAVENOUS | Status: DC
  Administered 2014-09-01 – 2014-09-03 (×3): 500 mg via INTRAVENOUS
  Filled 2014-09-01 (×3): qty 500

## 2014-09-01 MED ORDER — TRAZODONE HCL 100 MG PO TABS
100.0000 mg | ORAL_TABLET | Freq: Every day | ORAL | Status: DC
  Administered 2014-09-01 – 2014-09-03 (×3): 100 mg via ORAL
  Filled 2014-09-01: qty 1
  Filled 2014-09-01: qty 2
  Filled 2014-09-01 (×2): qty 1

## 2014-09-01 MED ORDER — ENOXAPARIN SODIUM 40 MG/0.4ML ~~LOC~~ SOLN
40.0000 mg | SUBCUTANEOUS | Status: DC
  Administered 2014-09-01 – 2014-09-04 (×4): 40 mg via SUBCUTANEOUS
  Filled 2014-09-01 (×4): qty 0.4

## 2014-09-01 MED ORDER — DIAZEPAM 5 MG PO TABS
5.0000 mg | ORAL_TABLET | Freq: Two times a day (BID) | ORAL | Status: DC | PRN
  Administered 2014-09-02 – 2014-09-03 (×3): 5 mg via ORAL
  Filled 2014-09-01 (×4): qty 1

## 2014-09-01 NOTE — Evaluation (Signed)
Clinical/Bedside Swallow Evaluation Patient Details  Name: Bianca Haley MRN: [id number] Date of Birth: 1951/03/01  Today's Date: 09/01/2014 Time: SLP Start Time (ACUTE ONLY): 49 SLP Stop Time (ACUTE ONLY): 1610 SLP Time Calculation (min) (ACUTE ONLY): 28 min  Past Medical History:  Past Medical History  Diagnosis Date  . Hypertension   . Back pain   . Headache(784.0)   . Migraine   . Anemia   . Anxiety   . History of UTI   . History of endometriosis   . History of uterine fibroid   . H/O tinnitus     left  . GERD (gastroesophageal reflux disease)   . Chronic renal failure   . Recurrent pneumonia    Past Surgical History:  Past Surgical History  Procedure Laterality Date  . Abdominal hysterectomy    . Bladder suspension    . Back surgery    . Colonoscopy  02/16/2008    normal  . Laparoscopy      for evauation of endometriosis   HPI:  64 year old female with PMH of HTN, migraine, anxiety, UTI, GERD, recurrent PNA, admitted with PNA. CXR-RIGHT lower lobe bronchopneumonia. Followup PA and lateral chest X-ray is recommended in 3-4 weeks following trial of antibiotic therapy to ensure resolution and exclude underlying malignancy. MBS 09/29/13-"Pt.'s oral and pharyngeal phases of swallow were within normal limits with timely swallow initiation, adequate laryngeal elevation and anterior excursion. No penetration or aspiration observed. Suspect a primary esophageal dyshagia as barium pill stopped mid esophagus requiring additional thin barium to facilitate transit to stomach (no radiologist present and SLP does not diagnose deficits below level of UES). Pt. exhibited frequent throat clearing without evidence of barium entering laryngeal vestibule."   Assessment / Plan / Recommendation Clinical Impression  Results from bedside swallow assessment consistent with MBS from 2015 in which patient presented with normal oropharyngeal swallowing function and a suspected primary  esophageal dysphagia. Patient with reports of significant GER history, globus, and accounts of regurgitation with subsequent PNA episodes. Educated patient and spouse on importance of esophageal precautions in preventing a PNA related to reflux of stomach contents/? LPR. Despite h/o GERD, patient reports having never been seen by GI MD. Recommend consideration of GI f/u to manage GER symptoms which may be contributing to recurrent PNAs.     Aspiration Risk  Moderate    Diet Recommendation Thin (regular solids)   Medication Administration: Whole meds with liquid Compensations: Slow rate;Small sips/bites;Follow solids with liquid (remain upright for 1 hour after pos)    Other  Recommendations Recommended Consults: Consider GI evaluation;Consider esophageal assessment Oral Care Recommendations: Oral care BID   Follow Up Recommendations          Pertinent Vitals/Pain n/a        Swallow Study    General Other Pertinent Information: 64 year old female with PMH of HTN, migraine, anxiety, UTI, GERD, recurrent PNA, admitted with PNA. CXR-RIGHT lower lobe bronchopneumonia. Followup PA and lateral chest X-ray is recommended in 3-4 weeks following trial of antibiotic therapy to ensure resolution and exclude underlying malignancy. MBS 09/29/13-"Pt.'s oral and pharyngeal phases of swallow were within normal limits with timely swallow initiation, adequate laryngeal elevation and anterior excursion. No penetration or aspiration observed. Suspect a primary esophageal dyshagia as barium pill stopped mid esophagus requiring additional thin barium to facilitate transit to stomach (no radiologist present and SLP does not diagnose deficits below level of UES). Pt. exhibited frequent throat clearing without evidence of barium entering laryngeal  vestibule." Type of Study: Other (Comment) (swallow evaluation) Previous Swallow Assessment: see HPI Diet Prior to this Study: Regular;Thin liquids Temperature Spikes  Noted: No Respiratory Status: Room air History of Recent Intubation: No Behavior/Cognition: Alert;Cooperative;Pleasant mood Oral Cavity - Dentition: Adequate natural dentition/normal for age Self-Feeding Abilities: Able to feed self Patient Positioning: Upright in bed Baseline Vocal Quality: Normal Volitional Cough: Strong Volitional Swallow: Able to elicit    Oral/Motor/Sensory Function Overall Oral Motor/Sensory Function: Appears within functional limits for tasks assessed   Ice Chips Ice chips: Not tested   Thin Liquid Thin Liquid: Within functional limits Presentation: Cup;Self Fed;Straw    Nectar Thick Nectar Thick Liquid: Not tested   Honey Thick Honey Thick Liquid: Not tested   Puree Puree: Within functional limits Presentation: Self Fed;Spoon   Solid   GO   Falisa Lamora MA, CCC-SLP [phone]  Solid: Within functional limits Presentation: [place] 09/01/2014,4:18 PM

## 2014-09-01 NOTE — ED Provider Notes (Signed)
She complains of generalized weakness, feels dehydrated and had 3 episodes diarrhea today. Patient has been treated with antibiotics for approximately the past week for urinary tract infection Also reports cough for the past week. Denies any shortness of breath. On exam nontoxic alert lungs clear to auscultation, coughing occasionally.  Orlie Dakin, MD 09/01/14 0002

## 2014-09-01 NOTE — Progress Notes (Signed)
The order for simvastatin(Zocor) was changed to an equivalent dose of atorvastatin(Lipitor) due to the potential drug interaction with Diltiazem.   When taken in combination with medications that inhibit its metabolism, simvastatin can accumulate which increases the risk of liver toxicity, myopathy, or rhabdomyolysis.  Simvastatin dose should not exceed 10mg /day in patients taking verapamil, diltiazem, fibrates, or niacin >or= 1g/day.   Simvastatin dose should not exceed 20mg /day in patients taking amlodipine, ranolazine or amiodarone.   Please consider this potential interaction at discharge.  Bianca Haley 09/01/2014 5:08 AM

## 2014-09-01 NOTE — H&P (Signed)
PCP: [name], MD    Referring provider Jacobowitz   Chief Complaint: Diarrhea  HPI: Bianca Haley is a 64 y.o. female   has a past medical history of Hypertension; Back pain; Headache(784.0); Migraine; Anemia; Anxiety; History of UTI; History of endometriosis; History of uterine fibroid; H/O tinnitus; GERD (gastroesophageal reflux disease); Chronic renal failure; and Recurrent pneumonia.   Patient have had recurrent episodes of aspiration pneumonia in the past 4 to be associated opana use. She reports waking up in AM with substance in the mouth. He has been followed up in the past with Dr. Elsworth Soho. Patient recently was diagnosed date of UTI which was treated from [place] but developed a rash was started on ciprofloxacin urine culture came back resistant to Cipro and she was switched to nitrofurantoin.  Last week patient developed diarrhea versus getting better but recently got worse after patient took her nitrofurantoin this was not associated with any nausea vomiting. Patient had been feeling weak and fatigued. She has endorsed some mild confusion. Her primary get doctor felt that she might be dehydrated and recommended visit to emergency department. She is here with her friend but lives alone. Chest x-ray today showed pneumonia. Given antibiotics history C. difficile was sent by emergency department emerge department she was given azithromycin and ceftriaxone and potassium Denies any fever, no chills no chest pain  Hospitalist was called for admission for pneumonia possibly aspiration  Review of Systems:    Pertinent positives include: Cough shortness of breath diarrhea  Constitutional:  No weight loss, night sweats, Fevers, chills, fatigue, weight loss  HEENT:  No headaches, Difficulty swallowing,Tooth/dental problems,Sore throat,  No sneezing, itching, ear ache, nasal congestion, post nasal drip,  Cardio-vascular:  No chest pain, Orthopnea, PND, anasarca, dizziness,  palpitations.no Bilateral lower extremity swelling  GI:  No heartburn, indigestion, abdominal pain, nausea, vomiting,  change in bowel habits, loss of appetite, melena, blood in stool, hematemesis Resp:   , No excess mucus,   No coughing up of blood.No change in color of mucus.No wheezing. Skin:  no rash or lesions. No jaundice GU:  no dysuria, change in color of urine, no urgency or frequency. No straining to urinate.  No flank pain.  Musculoskeletal:  No joint pain or no joint swelling. No decreased range of motion. No back pain.  Psych:  No change in mood or affect. No depression or anxiety. No memory loss.  Neuro: no localizing neurological complaints, no tingling, no weakness, no double vision, no gait abnormality, no slurred speech, no confusion  Otherwise ROS are negative except for above, 10 systems were reviewed  Past Medical History: Past Medical History  Diagnosis Date  . Hypertension   . Back pain   . Headache(784.0)   . Migraine   . Anemia   . Anxiety   . History of UTI   . History of endometriosis   . History of uterine fibroid   . H/O tinnitus     left  . GERD (gastroesophageal reflux disease)   . Chronic renal failure   . Recurrent pneumonia    Past Surgical History  Procedure Laterality Date  . Abdominal hysterectomy    . Bladder suspension    . Back surgery    . Colonoscopy  02/16/2008    normal  . Laparoscopy      for evauation of endometriosis     Medications: Prior to Admission medications   Medication Sig Start Date End Date Taking? Authorizing Provider  albuterol (PROVENTIL HFA;VENTOLIN  HFA) 108 (90 BASE) MCG/ACT inhaler 2 puffs every 4-6 hours as needed for wheezing/shortness of breath 06/19/14  Yes Tammy S Parrett, NP  ASTRAGALUS PO Take 2 tablets by mouth 2 (two) times daily.    Yes Historical Provider, MD  atenolol (TENORMIN) 100 MG tablet Take 100 mg by mouth daily.   Yes Historical Provider, MD  cholecalciferol (VITAMIN D) 1000 UNITS  tablet Take 1 tablet (1,000 Units total) by mouth daily. 10/13/13 10/13/14 Yes Aleksei Plotnikov V, MD  diazepam (VALIUM) 5 MG tablet Take 5 mg by mouth every 12 (twelve) hours as needed for anxiety or muscle spasms.  08/07/14  Yes Historical Provider, MD  diltiazem (TIAZAC) 180 MG 24 hr capsule Take 1 capsule (180 mg total) by mouth daily. 12/07/13  Yes Aleksei Plotnikov V, MD  gabapentin (NEURONTIN) 300 MG capsule Take 2 capsules by mouth at bedtime.  01/09/14  Yes Historical Provider, MD  hydrochlorothiazide (MICROZIDE) 12.5 MG capsule Take 1 capsule (12.5 mg total) by mouth daily. Patient taking differently: Take 12.5 mg by mouth daily as needed (for leg swelling).  08/24/14  Yes Aleksei Plotnikov V, MD  oxymorphone (OPANA) 10 MG tablet Take 10 mg by mouth daily as needed for pain. Morning or afternoon 09/29/13  Yes Samella Parr, NP  pantoprazole (PROTONIX) 40 MG tablet Take 1 tablet (40 mg total) by mouth 2 (two) times daily. 10/25/13  Yes Aleksei Plotnikov V, MD  PARoxetine (PAXIL) 40 MG tablet TAKE 1 TABLET (40 MG TOTAL) BY MOUTH 2 (TWO) TIMES DAILY. 05/03/14  Yes Aleksei Plotnikov V, MD  simvastatin (ZOCOR) 40 MG tablet TAKE 1/2 TABLET BY MOUTH ONCE DAILY 07/10/14  Yes Aleksei Plotnikov V, MD  SUMAtriptan (IMITREX) 100 MG tablet Take 1 tablet (100 mg total) by mouth once. May repeat in 2 hours if headache persists or recurs. 08/21/14  Yes Aleksei Plotnikov V, MD  traZODone (DESYREL) 100 MG tablet TAKE 1 TABLET BY MOUTH AT BEDTIME 06/15/14  Yes Aleksei Plotnikov V, MD  ALPRAZolam (XANAX) 0.25 MG tablet We recommend using 1/2 or your 0.5 mg tabs every 6 hours as needed for anxiety Patient taking differently: Take 0.25 mg by mouth every 6 (six) hours as needed for anxiety.  09/29/13   Samella Parr, NP  nitrofurantoin, macrocrystal-monohydrate, (MACROBID) 100 MG capsule Take 1 capsule (100 mg total) by mouth 2 (two) times daily. Patient not taking: Reported on 08/31/2014 08/24/14   [name], MD     Allergies:   Allergies  Allergen Reactions  . Penicillins Shortness Of Breath and Rash  . Morphine And Related Swelling    Severe pain Morphine only; other opioids OK    Social History:  Ambulatory independently   Lives at home alone,         reports that she has never smoked. She has never used smokeless tobacco. She reports that she does not drink alcohol or use illicit drugs.    Family History: family history includes Emphysema in her mother; Heart disease in her mother.    Physical Exam: Patient Vitals for the past 24 hrs:  BP Temp Temp src Pulse Resp SpO2  09/01/14 0029 146/69 mmHg - - (!) 20 20 99 %  08/31/14 2330 155/77 mmHg - - 76 - 100 %  08/31/14 2300 166/65 mmHg - - - - -  08/31/14 2245 152/68 mmHg - - 68 - 98 %  08/31/14 2230 160/77 mmHg - - - - -  08/31/14 2200 148/77 mmHg - -  71 - 99 %  08/31/14 2157 164/76 mmHg - - 71 18 99 %  08/31/14 1733 155/68 mmHg 98.2 F (36.8 C) Oral 70 16 94 %    1. General:  in No Acute distress 2. Psychological: Alert and  Oriented 3. Head/ENT:   Moist  Mucous Membranes                          Head Non traumatic, neck supple                          Normal  Dentition 4. SKIN:   decreased Skin turgor,  Skin clean Dry and intact no rash 5. Heart: Regular rate and rhythm no Murmur, Rub or gallop 6. Lungs:   no wheezes or crackles  somewhat diminished in the right base 7. Abdomen: Soft, non-tender, Non distended 8. Lower extremities: no clubbing, cyanosis, or edema 9. Neurologically Grossly intact, moving all 4 extremities equally 10. MSK: Normal range of motion  body mass index is unknown because there is no weight on file.   Labs on Admission:   Results for orders placed or performed during the hospital encounter of 08/31/14 (from the past 24 hour(s))  Urinalysis, Routine w reflex microscopic     Status: Abnormal   Collection Time: 08/31/14  5:38 PM  Result Value Ref Range   Color, Urine AMBER (A) YELLOW    APPearance CLOUDY (A) CLEAR   Specific Gravity, Urine 1.024 1.005 - 1.030   pH 6.0 5.0 - 8.0   Glucose, UA NEGATIVE NEGATIVE mg/dL   Hgb urine dipstick TRACE (A) NEGATIVE   Bilirubin Urine MODERATE (A) NEGATIVE   Ketones, ur >80 (A) NEGATIVE mg/dL   Protein, ur 100 (A) NEGATIVE mg/dL   Urobilinogen, UA 1.0 0.0 - 1.0 mg/dL   Nitrite NEGATIVE NEGATIVE   Leukocytes, UA TRACE (A) NEGATIVE  Urine microscopic-add on     Status: Abnormal   Collection Time: 08/31/14  5:38 PM  Result Value Ref Range   Squamous Epithelial / LPF FEW (A) RARE   WBC, UA 3-6 <3 WBC/hpf   RBC / HPF 0-2 <3 RBC/hpf   Bacteria, UA RARE RARE   Urine-Other MUCOUS PRESENT   CBC with Differential     Status: Abnormal   Collection Time: 08/31/14  5:53 PM  Result Value Ref Range   WBC 12.7 (H) 4.0 - 10.5 K/uL   RBC 4.57 3.87 - 5.11 MIL/uL   Hemoglobin 12.1 12.0 - 15.0 g/dL   HCT 36.9 36.0 - 46.0 %   MCV 80.7 78.0 - 100.0 fL   MCH 26.5 26.0 - 34.0 pg   MCHC 32.8 30.0 - 36.0 g/dL   RDW 14.2 11.5 - 15.5 %   Platelets 238 150 - 400 K/uL   Neutrophils Relative % 85 (H) 43 - 77 %   Neutro Abs 10.7 (H) 1.7 - 7.7 K/uL   Lymphocytes Relative 11 (L) 12 - 46 %   Lymphs Abs 1.4 0.7 - 4.0 K/uL   Monocytes Relative 4 3 - 12 %   Monocytes Absolute 0.5 0.1 - 1.0 K/uL   Eosinophils Relative 0 0 - 5 %   Eosinophils Absolute 0.1 0.0 - 0.7 K/uL   Basophils Relative 0 0 - 1 %   Basophils Absolute 0.0 0.0 - 0.1 K/uL  Comprehensive metabolic panel     Status: Abnormal   Collection Time: 08/31/14  5:53 PM  Result Value Ref Range   Sodium 137 135 - 145 mmol/L   Potassium 3.3 (L) 3.5 - 5.1 mmol/L   Chloride 101 96 - 112 mmol/L   CO2 25 19 - 32 mmol/L   Glucose, Bld 80 70 - 99 mg/dL   BUN 13 6 - 23 mg/dL   Creatinine, Ser 0.67 0.50 - 1.10 mg/dL   Calcium 8.9 8.4 - 10.5 mg/dL   Total Protein 7.0 6.0 - 8.3 g/dL   Albumin 3.6 3.5 - 5.2 g/dL   AST 21 0 - 37 U/L   ALT 12 0 - 35 U/L   Alkaline Phosphatase 97 39 - 117 U/L   Total  Bilirubin 1.8 (H) 0.3 - 1.2 mg/dL   GFR calc non Af Amer 90 (L) >90 mL/min   GFR calc Af Amer >90 >90 mL/min   Anion gap 11 5 - 15    UA 3-6 white blood cells no RBCs and rare bacteria  No results found for: HGBA1C  Estimated Creatinine Clearance: 63.6 mL/min (by C-G formula based on Cr of 0.67).  BNP (last 3 results) No results for input(s): PROBNP in the last 8760 hours.  Other results:  I have pearsonaly reviewed this: ECG REPORT not obtained  There were no vitals filed for this visit.  Cultures:    Component Value Date/Time   SDES URINE, CLEAN CATCH 08/17/2014 1930   SPECREQUEST NONE 08/17/2014 1930   CULT  08/17/2014 1930    ESCHERICHIA COLI Note: Confirmed Extended Spectrum Beta-Lactamase Producer (ESBL) CRITICAL RESULT CALLED TO, READ BACK BY AND VERIFIED WITH: TONY F. AT 4:09PM ON 08/20/14 HAJAM REPORT FAXED BY REQUEST Performed at [place] 08/24/2014 FINAL 08/17/2014 1930     Radiological Exams on Admission: Dg Chest 2 View  08/31/2014   CLINICAL DATA:  Shortness of breath for 2 weeks, cough, history of pneumonia. Recent urinary tract infection. History of chronic renal failure and recurrent pneumonia.  EXAM: CHEST  2 VIEW  COMPARISON:  Chest radiograph March 19, 2014  FINDINGS: RIGHT lower lobe alveolar airspace opacity with mild interstitial prominence throughout the RIGHT lung. No pleural effusion. Cardiac silhouette is upper limits of normal in size, mediastinal silhouette is nonsuspicious. No pneumothorax. Soft tissue planes and included osseous structure nonsuspicious, scoliosis.  IMPRESSION: RIGHT lower lobe bronchopneumonia. Followup PA and lateral chest X-ray is recommended in 3-4 weeks following trial of antibiotic therapy to ensure resolution and exclude underlying malignancy.  Borderline cardiomegaly.   Electronically Signed   By: Elon Alas   On: 08/31/2014 22:44    Chart has been reviewed  Family not at  Bedside      Assessment/Plan 64 year old female history of chronic pain recurrent pneumonia in the same right lower lobe for the past year presents with diarrhea after antibiotics treatment for UTI and presumed pneumonia  Present on Admission:  . CAP (community acquired pneumonia) vs Aspiration pneumonia - patient continues to have abnormalities in right lower lung although this could've potentially aspiration pneumonia other possibilities include malignancy giving that patient has persistent findings. We'll obtain CT of the chest to try to clarify this father. Patient is allergic to amoxicillin this point will treat with azithromycin and ceftriaxone interest true evidence of aspiration will need to narrow down antibiotic coverage. We'll have speech pathology evaluation  . Chronic neck and back pain oxycodone when necessary hold on all panel while in hospital  . Diarrhea patient has had antibiotic exposure. We'll check for C. difficile  PCR ordered  . Diastolic dysfunction- grade I currently appears to be dehydrated we'll give gentle IV fluids being mindful of history of grade 1 dysfunction  . Essential hypertension continue home medications  . Hypokalemia will replace    Prophylaxis:  Lovenox   CODE STATUS:  FULL CODE as per patient     Disposition:    To home once workup is complete and patient is stable  Other plan as per orders.  I have spent a total of 55 min on this admission  Francia Verry 09/01/2014, 12:52 AM  [facility]  Pager [phone]   after 2 AM please page floor coverage PA If 7AM-7PM, please contact the day team taking care of the patient  [email or web address]  Password [email or web address]

## 2014-09-01 NOTE — Progress Notes (Addendum)
Patient ID: Bianca Haley, female   DOB: [birth year], 64 y.o.   MRN: [id number] [facility] PROGRESS NOTE  Bianca Haley [id number] DOB: [birth year] DOA: 08/31/2014 PCP: [name], MD  Brief narrative:     Addendum to admission note done 09/01/2014. 64 y.o. female with past medical history of hypertension, anxiety, endometriosis, uterine fibroid, recurrent pneumonia thought to be aspiration from chronic narcotic abuse. Pt presented to [facility] ED with concern for diarrhea and poor appetite. She was actually found to have pneumonia on CXR in right lower lobe. She was started on azithromycin and rocephin.  Barrier to discharge: Continue current abx for pneumonia.   Assessment/Plan:    Principal Problem: Lobar pneumonia / right lower lobe bronchopneumonia - CXR on admission showed right lower lobe bronchopneumonia. Followup PA and lateral chest X-ray is recommended in 3-4 weeks following trial of antibiotic therapy to ensure resolution and exclude underlying malignancy. - Continue azithromycin and rocephin - Continue albuterol PRN shortness of breath or wheezing   Active Problems: Hypertension - Continue atenolol    DVT Prophylaxis  - Lovenox sub Q ordered    Code Status: Full.  Family Communication:  plan of care discussed with the patient Disposition Plan: Home in next 2-3 days.   IV access:  Peripheral IV  Procedures and diagnostic studies:    Dg Chest 2 View 08/31/2014  RIGHT lower lobe bronchopneumonia. Followup PA and lateral chest X-ray is recommended in 3-4 weeks following trial of antibiotic therapy to ensure resolution and exclude underlying malignancy.  Borderline cardiomegaly.   Electronically Signed   By: Elon Alas   On: 08/31/2014 22:44    Medical Consultants:  None  Other Consultants:  None   IAnti-Infectives:   Azithromycin and rocephin 09/01/2014 -->   Leisa Lenz, MD  [facility] Pager [phone]  If 7PM-7AM, please contact  night-coverage [email or web address] Password [email or web address] 09/01/2014, 2:51 PM   LOS: 0 days    HPI/Subjective: No acute overnight events. Patient reports weakness and fatigue. No nausea or vomiting.   Objective: Filed Vitals:   09/01/14 0251 09/01/14 0417 09/01/14 0448 09/01/14 1443  BP: 156/78 148/71 156/69 167/81  Pulse: 78 74 70 69  Temp:   98.1 F (36.7 C) 98.3 F (36.8 C)  TempSrc:   Oral Oral  Resp: 18 18 18 18   Height:   5\' 5"  (1.651 m)   Weight:   63.8 kg (140 lb 10.5 oz)   SpO2: 97% 96% 97% 95%    Intake/Output Summary (Last 24 hours) at 09/01/14 1451 Last data filed at 09/01/14 1300  Gross per 24 hour  Intake 1978.33 ml  Output      0 ml  Net 1978.33 ml    Exam:   General:  Pt is alert, follows commands appropriately, not in acute distress  Cardiovascular: Regular rate and rhythm, S1/S2, no murmurs  Respiratory: Clear to auscultation bilaterally, no wheezing, no crackles, no rhonchi  Abdomen: Soft, non tender, non distended, bowel sounds present  Extremities: No edema, pulses DP and PT palpable bilaterally  Neuro: Grossly nonfocal  Data Reviewed: Basic Metabolic Panel:  Recent Labs Lab 08/31/14 1753 09/01/14 0521  NA 137 141  K 3.3* 3.7  CL 101 105  CO2 25 27  GLUCOSE 80 69*  BUN 13 11  CREATININE 0.67 0.56  CALCIUM 8.9 8.7   Liver Function Tests:  Recent Labs Lab 08/31/14 1753 09/01/14 0521  AST 21 17  ALT 12 10  ALKPHOS 97 82  BILITOT 1.8*  1.0  PROT 7.0 6.1  ALBUMIN 3.6 3.1*   No results for input(s): LIPASE, AMYLASE in the last 168 hours. No results for input(s): AMMONIA in the last 168 hours. CBC:  Recent Labs Lab 08/31/14 1753 09/01/14 0521  WBC 12.7* 9.0  NEUTROABS 10.7*  --   HGB 12.1 10.3*  HCT 36.9 32.3*  MCV 80.7 81.0  PLT 238 193   Cardiac Enzymes: No results for input(s): CKTOTAL, CKMB, CKMBINDEX, TROPONINI in the last 168 hours. BNP: Invalid input(s): POCBNP CBG: No results for input(s): GLUCAP in the last 168  hours.  CULTURE, URINE COMPREHENSIVE     Status: None   Collection Time: 08/24/14 10:15 AM  Result Value Ref Range Status   Colony Count NO GROWTH  Final   Organism ID, Bacteria NO GROWTH  Final  MRSA PCR Screening     Status: None   Collection Time: 09/01/14  6:07 AM  Result Value Ref Range Status   MRSA by PCR NEGATIVE NEGATIVE Final  Clostridium Difficile by PCR     Status: None   Collection Time: 09/01/14  7:16 AM  Result Value Ref Range Status   C difficile by pcr NEGATIVE NEGATIVE Final     Scheduled Meds: . atenolol  100 mg Oral Daily  . atorvastatin  10 mg Oral q1800  . azithromycin  500 mg Intravenous Q24H  . cefTRIAXone (ROCEPHIN)  IV  1 g Intravenous Q24H  . diltiazem  180 mg Oral Daily  . enoxaparin (LOVENOX) injection  40 mg Subcutaneous Q24H  . gabapentin  600 mg Oral QHS  . PARoxetine  40 mg Oral BID  . traZODone  100 mg Oral QHS   Continuous Infusions: . sodium chloride 100 mL/hr at 09/01/14 [phone]

## 2014-09-02 DIAGNOSIS — I10 Essential (primary) hypertension: Secondary | ICD-10-CM

## 2014-09-02 LAB — URINE CULTURE

## 2014-09-02 NOTE — Progress Notes (Signed)
Patient ID: Bianca Haley, female   DOB: 10-22-1950, 64 y.o.   MRN: [id number] [facility] PROGRESS NOTE  Bianca Haley [id number] DOB: 18-Feb-1951 DOA: 08/31/2014 PCP: [name], MD  Brief narrative:    64 y.o. female with past medical history of hypertension, anxiety, endometriosis, uterine fibroid, recurrent pneumonia thought to be aspiration from chronic narcotic abuse.  Pt presented to [facility] ED with concern for diarrhea and poor appetite. She was actually found to have pneumonia on CXR in right lower lobe. She was started on azithromycin and rocephin.  Barrier to discharge: Continue current abx for pneumonia. Hopefully home in next 2-3 days.   Assessment/Plan:    Principal Problem: Lobar pneumonia / right lower lobe bronchopneumonia / Leukocytosis - CXR on admission showed right lower lobe bronchopneumonia.  - Pt started on azithromycin and rocephin. - CT chest showed extensive perihilar ill-defined nodular opacities within the right lung with associated mediastinal and right hilar adenopathy, similar in presentation when compared with study in 02/2013. A follow-up contrast-enhanced chest CT in 3-4 weeks after treatment is recommended to ensure resolution and/or to establish a new baseline per radiology recommendations.  - Strep pneumonia is negative, legionella is pending.  - Continue albuterol PRN shortness of breath or wheezing  - Respiratory status is stable.   Active Problems: Essential hypertension - Continue atenolol and diltiazem. She take Hctz only PRN for leg swelling.   Diarrhea - Unclear etiology. C.diff negative. GI pathogen panel pending. O/P stool pending - Stool culture - needs to be re-incubated for better growth.  Depression - Stable - Continue Paxil  Dyslipidemia - Continue statin therapy   DVT Prophylaxis  - Lovenox sub Q ordered    Code Status: Full.  Family Communication:  plan of care discussed with the patient; family not at the  bedside this morning. Disposition Plan: Home in next 2-3 days.    IV access:  Peripheral IV  Procedures and diagnostic studies:    Dg Chest 2 View 08/31/2014  RIGHT lower lobe bronchopneumonia. Followup PA and lateral chest X-ray is recommended in 3-4 weeks following trial of antibiotic therapy to ensure resolution and exclude underlying malignancy.  Borderline cardiomegaly.     CT chest with contrast 09/01/2014 Extensive perihilar predominant ill-defined heterogeneous slightly nodular opacities within the right lung with associated mediastinal and right hilar adenopathy, similar though progressed since the 02/2013 examination. While constellation of findings are suggestive of chronic atypical infection, a follow-up contrast-enhanced chest CT in 3-4 weeks after treatment is recommended to ensure resolution and/or to establish a new baseline. Alternatively, further evaluation could be performed with bronchoscopy as clinically indicated.   Medical Consultants:  None  Other Consultants:  None   IAnti-Infectives:   Azithromycin 09/01/2014 --> Rocephin 09/01/2014 -->  Time spent: 25 minutes  Leisa Lenz, MD  [facility] Pager [phone]  If 7PM-7AM, please contact night-coverage [email or web address] Password [email or web address] 09/02/2014, 3:11 PM   LOS: 1 day    HPI/Subjective: No acute overnight events. Patient reports still feeling weak. Has diarrhea.    Objective: Filed Vitals:   09/01/14 0448 09/01/14 1443 09/01/14 2033 09/02/14 1415  BP: 156/69 167/81 171/86 141/77  Pulse: 70 69 74 65  Temp: 98.1 F (36.7 C) 98.3 F (36.8 C) 97.9 F (36.6 C) 98.4 F (36.9 C)  TempSrc: Oral Oral Oral Oral  Resp: 18 18 18 18   Height: 5\' 5"  (1.651 m)     Weight: 63.8 kg (140 lb 10.5 oz)     SpO2: 97%  95% 97% 96%    Intake/Output Summary (Last 24 hours) at 09/02/14 1511 Last data filed at 09/02/14 1300  Gross per 24 hour  Intake   1380 ml  Output      0 ml  Net   1380 ml    Exam:   General:   Pt is alert, awake, non distress  Cardiovascular: RRR, (+) S1,S2  Respiratory: bilateral air entry, no wheezing  Abdomen: non tender abdomen, non distended, (+) BS  Extremities: no leg edema, pulses palpable  Neuro: No focal deficits   Data Reviewed: Basic Metabolic Panel:  Recent Labs Lab 08/31/14 1753 09/01/14 0521  NA 137 141  K 3.3* 3.7  CL 101 105  CO2 25 27  GLUCOSE 80 69*  BUN 13 11  CREATININE 0.67 0.56  CALCIUM 8.9 8.7   Liver Function Tests:  Recent Labs Lab 08/31/14 1753 09/01/14 0521  AST 21 17  ALT 12 10  ALKPHOS 97 82  BILITOT 1.8* 1.0  PROT 7.0 6.1  ALBUMIN 3.6 3.1*   No results for input(s): LIPASE, AMYLASE in the last 168 hours. No results for input(s): AMMONIA in the last 168 hours. CBC:  Recent Labs Lab 08/31/14 1753 09/01/14 0521  WBC 12.7* 9.0  NEUTROABS 10.7*  --   HGB 12.1 10.3*  HCT 36.9 32.3*  MCV 80.7 81.0  PLT 238 193   Cardiac Enzymes: No results for input(s): CKTOTAL, CKMB, CKMBINDEX, TROPONINI in the last 168 hours. BNP: Invalid input(s): POCBNP CBG: No results for input(s): GLUCAP in the last 168 hours.  CULTURE, URINE COMPREHENSIVE     Status: None   Collection Time: 08/24/14 10:15 AM  Result Value Ref Range Status   Colony Count NO GROWTH  Final   Organism ID, Bacteria NO GROWTH  Final  MRSA PCR Screening     Status: None   Collection Time: 09/01/14  6:07 AM  Result Value Ref Range Status   MRSA by PCR NEGATIVE NEGATIVE Final  Clostridium Difficile by PCR     Status: None   Collection Time: 09/01/14  7:16 AM  Result Value Ref Range Status   C difficile by pcr NEGATIVE NEGATIVE Final     Scheduled Meds: . atenolol  100 mg Oral Daily  . atorvastatin  10 mg Oral q1800  . azithromycin  500 mg Intravenous Q24H  . cefTRIAXone (ROCEPHIN)  IV  1 g Intravenous Q24H  . diltiazem  180 mg Oral Daily  . enoxaparin (LOVENOX) injection  40 mg Subcutaneous Q24H  . gabapentin  600 mg Oral QHS  . PARoxetine  40 mg  Oral BID  . traZODone  100 mg Oral QHS

## 2014-09-02 NOTE — Care Management Note (Signed)
Case Management Note  Patient Details  Name: Bianca Haley MRN: [id number] Date of Birth: 08-11-1950  Subjective/Objective:   64 y/o f admitted w/PNA.                 Action/Plan:Monitor progress for d/c needs. No anticipated d/c needs.  Expected Discharge Date:  09/04/14               Expected Discharge Plan:  Home/Self Care  In-House Referral:     Discharge planning Services  CM Consult  Post Acute Care Choice:    Choice offered to:     DME Arranged:    DME Agency:     HH Arranged:    HH Agency:     Status of Service:  In process, will continue to follow  Medicare Important Message Given:    Date Medicare IM Given:    Medicare IM give by:    Date Additional Medicare IM Given:    Additional Medicare Important Message give by:     If discussed at [place] of Stay Meetings, dates discussed:    Additional Comments:  Dessa Phi, RN 09/02/2014, 5:17 PM

## 2014-09-03 ENCOUNTER — Ambulatory Visit: Payer: Medicare Other | Admitting: Internal Medicine

## 2014-09-03 LAB — BASIC METABOLIC PANEL
ANION GAP: 11 (ref 5–15)
BUN: <5 mg/dL — ABNORMAL LOW (ref 6–20)
CO2: 27 mmol/L (ref 22–32)
CREATININE: 0.61 mg/dL (ref 0.44–1.00)
Calcium: 8.9 mg/dL (ref 8.9–10.3)
Chloride: 104 mmol/L (ref 101–111)
GFR calc non Af Amer: >60 mL/min (ref >60)
GLUCOSE: 102 mg/dL — AB (ref 70–99)
Potassium: 2.9 mmol/L — ABNORMAL LOW (ref 3.5–5.1)
SODIUM: 142 mmol/L (ref 135–145)

## 2014-09-03 LAB — CBC
HEMATOCRIT: 35.7 % — AB (ref 36.0–46.0)
Hemoglobin: 11.7 g/dL — ABNORMAL LOW (ref 12.0–15.0)
MCH: 26 pg (ref 26.0–34.0)
MCHC: 32.8 g/dL (ref 30.0–36.0)
MCV: 79.3 fL (ref 78.0–100.0)
PLATELETS: 230 10*3/uL (ref 150–400)
RBC: 4.5 MIL/uL (ref 3.87–5.11)
RDW: 14 % (ref 11.5–15.5)
WBC: 6.8 10*3/uL (ref 4.0–10.5)

## 2014-09-03 LAB — LEGIONELLA ANTIGEN, URINE

## 2014-09-03 MED ORDER — PANTOPRAZOLE SODIUM 40 MG PO TBEC
40.0000 mg | DELAYED_RELEASE_TABLET | Freq: Two times a day (BID) | ORAL | Status: DC
  Administered 2014-09-03 – 2014-09-04 (×3): 40 mg via ORAL
  Filled 2014-09-03 (×4): qty 1

## 2014-09-03 MED ORDER — POTASSIUM CHLORIDE CRYS ER 20 MEQ PO TBCR
40.0000 meq | EXTENDED_RELEASE_TABLET | ORAL | Status: AC
  Administered 2014-09-03 (×2): 40 meq via ORAL
  Filled 2014-09-03 (×2): qty 2

## 2014-09-03 MED ORDER — LOPERAMIDE HCL 2 MG PO CAPS
2.0000 mg | ORAL_CAPSULE | ORAL | Status: DC | PRN
  Administered 2014-09-03 (×2): 2 mg via ORAL
  Filled 2014-09-03 (×2): qty 1

## 2014-09-03 NOTE — Progress Notes (Signed)
[facility] PROGRESS NOTE  Bianca Haley QPR:[id number] DOB: 1951-01-28 DOA: 08/31/2014 PCP: [name], MD  brief narrative: 64 y.o. female with past medical history of hypertension, anxiety, endometriosis, uterine fibroid, recurrent pneumonia thought to be aspiration from chronic narcotic abuse. Pt presented to [facility] ED with concern for diarrhea and poor appetite. She was  found to have pneumonia on CXR in right lower lobe. She was started on azithromycin and rocephin.   Assessment/ plan:  Principal Problem: Lobar pneumonia  - continue empiric  Rocephin and azithromycin. - CT chest showed extensive perihilar ill-defined nodular opacities within the right lung with associated mediastinal and right hilar adenopathy, similar in presentation when compared with study in 02/2013. A follow-up contrast-enhanced chest CT in 3-4 weeks after treatment is recommended to ensure resolution and/or to establish a new baseline per radiology recommendations.  - cultures negative. Afebrile and wbc improved. - Continue albuterol PRN  -seen by swallow eval and findings consistent with eval / MBS from 2015 showing normal oropharyngeal swallowing function. EGD in 04/2014 showed grade A esophagitis, hiatal hernia and some gastritis.  - no clear etiology for recurrent PNA. Should fllow up with her pulmonary and GI as outpt  Active Problems: Essential hypertension - Continue atenolol and diltiazem. She take Hctz only PRN for leg swelling.    GERD/ esophagitis On PPI BID which will be resumed.  Diarrhea - Unclear etiology. C.diff negative. GI pathogen panel pending. O/P stool pending - Stool culture re-incubated for better growth. reports ongoing for past 2 weeks. Will order lomotil.   Essential HTN Stable. continue  Home meds  Hypokalemia  replenish with kcl   Depression - Stable - Continue Paxil  Dyslipidemia - Continue statin   DVT Prophylaxis  - sq Lovenox   Diet: regular  with thin liquids   Code Status: Full.  Family Communication: husband at bedside Disposition Plan: Home tomorrow if diarrhea resolves    Consultants:  none  Procedures:  none  Antibiotics:  Rocephin and azithromycin  HPI/Subjective:   Objective: Filed Vitals:   09/03/14 1439  BP: 141/72  Pulse: 66  Temp: 98.1 F (36.7 C)  Resp: 18    Intake/Output Summary (Last 24 hours) at 09/03/14 1506 Last data filed at 09/03/14 1300  Gross per 24 hour  Intake   1620 ml  Output      0 ml  Net   1620 ml   Filed Weights   09/01/14 0448  Weight: 63.8 kg (140 lb 10.5 oz)    Exam:   General:  Elderly thin built female in NAD   HEENT: no pallor, moist mucosa, supple neck  chest: clear b/l, no added sounds   CVS: NS1&S2, no murmurs, rubs or gallop   GI: soft, NT, ND, BS+  Musculoskeletal: warm, no edema   CNS: alert and oriented.  Data Reviewed: Basic Metabolic Panel:  Recent Labs Lab 08/31/14 1753 09/01/14 0521 09/03/14 0603  NA 137 141 142  K 3.3* 3.7 2.9*  CL 101 105 104  CO2 25 27 27   GLUCOSE 80 69* 102*  BUN 13 11 <5*  CREATININE 0.67 0.56 0.61  CALCIUM 8.9 8.7 8.9   Liver Function Tests:  Recent Labs Lab 08/31/14 1753 09/01/14 0521  AST 21 17  ALT 12 10  ALKPHOS 97 82  BILITOT 1.8* 1.0  PROT 7.0 6.1  ALBUMIN 3.6 3.1*   No results for input(s): LIPASE, AMYLASE in the last 168 hours. No results for input(s): AMMONIA in the last  168 hours. CBC:  Recent Labs Lab 08/31/14 1753 09/01/14 0521 09/03/14 0603  WBC 12.7* 9.0 6.8  NEUTROABS 10.7*  --   --   HGB 12.1 10.3* 11.7*  HCT 36.9 32.3* 35.7*  MCV 80.7 81.0 79.3  PLT 238 193 230   Cardiac Enzymes: No results for input(s): CKTOTAL, CKMB, CKMBINDEX, TROPONINI in the last 168 hours. BNP (last 3 results) No results for input(s): BNP in the last 8760 hours.  ProBNP (last 3 results) No results for input(s): PROBNP in the last 8760 hours.  CBG: No results for input(s):  GLUCAP in the last 168 hours.  Recent Results (from the past 240 hour(s))  Urine culture     Status: None   Collection Time: 08/31/14  5:38 PM  Result Value Ref Range Status   Specimen Description URINE, CLEAN CATCH  Final   Special Requests NONE  Final   Colony Count   Final    9,000 COLONIES/ML Performed at [facility]    Culture   Final    INSIGNIFICANT GROWTH Performed at [facility]    Report Status 09/02/2014 FINAL  Final  MRSA PCR Screening     Status: None   Collection Time: 09/01/14  6:07 AM  Result Value Ref Range Status   MRSA by PCR NEGATIVE NEGATIVE Final    Comment:        The GeneXpert MRSA Assay (FDA approved for NASAL specimens only), is one component of a comprehensive MRSA colonization surveillance program. It is not intended to diagnose MRSA infection nor to guide or monitor treatment for MRSA infections.   Clostridium Difficile by PCR     Status: None   Collection Time: 09/01/14  7:16 AM  Result Value Ref Range Status   C difficile by pcr NEGATIVE NEGATIVE Final  Stool culture     Status: None (Preliminary result)   Collection Time: 09/01/14 10:00 AM  Result Value Ref Range Status   Specimen Description STOOL  Final   Special Requests NONE  Final   Culture   Final    NO SUSPICIOUS COLONIES, CONTINUING TO HOLD Performed at [facility]    Report Status PENDING  Incomplete     Studies: No results found.  Scheduled Meds: . atenolol  100 mg Oral Daily  . atorvastatin  10 mg Oral q1800  . azithromycin  500 mg Intravenous Q24H  . cefTRIAXone (ROCEPHIN)  IV  1 g Intravenous Q24H  . diltiazem  180 mg Oral Daily  . enoxaparin (LOVENOX) injection  40 mg Subcutaneous Q24H  . gabapentin  600 mg Oral QHS  . PARoxetine  40 mg Oral BID  . traZODone  100 mg Oral QHS   Continuous Infusions:     Time spent: 25 minutes    Bianca Haley  [facility] Pager [phone] If 7PM-7AM, please contact night-coverage at  [email or web address], password [facility] 09/03/2014, 3:06 PM  LOS: 2 days

## 2014-09-04 DIAGNOSIS — F1920 Other psychoactive substance dependence, uncomplicated: Secondary | ICD-10-CM

## 2014-09-04 DIAGNOSIS — K219 Gastro-esophageal reflux disease without esophagitis: Secondary | ICD-10-CM | POA: Diagnosis present

## 2014-09-04 DIAGNOSIS — F1120 Opioid dependence, uncomplicated: Secondary | ICD-10-CM | POA: Diagnosis present

## 2014-09-04 LAB — OVA AND PARASITE EXAMINATION: Ova and parasites: NONE SEEN

## 2014-09-04 LAB — BASIC METABOLIC PANEL
Anion gap: 5 (ref 5–15)
BUN: <5 mg/dL — ABNORMAL LOW (ref 6–20)
CO2: 26 mmol/L (ref 22–32)
CREATININE: 0.76 mg/dL (ref 0.44–1.00)
Calcium: 8.6 mg/dL — ABNORMAL LOW (ref 8.9–10.3)
Chloride: 106 mmol/L (ref 101–111)
GFR calc Af Amer: >60 mL/min (ref >60)
Glucose, Bld: 94 mg/dL (ref 70–99)
Potassium: 3.9 mmol/L (ref 3.5–5.1)
Sodium: 137 mmol/L (ref 135–145)

## 2014-09-04 MED ORDER — LEVOFLOXACIN 750 MG PO TABS: 750.0000 mg | ORAL_TABLET | Freq: Every day | ORAL | Status: AC

## 2014-09-04 MED ORDER — CLOTRIMAZOLE 1 % VA CREA: 1.0000 | TOPICAL_CREAM | Freq: Every day | VAGINAL | Status: AC

## 2014-09-04 MED ORDER — LOPERAMIDE HCL 2 MG PO CAPS: 2.0000 mg | ORAL_CAPSULE | ORAL | Status: DC | PRN

## 2014-09-04 NOTE — Discharge Summary (Signed)
Physician Discharge Summary  Bianca Haley DPO:[id number] DOB: Jan 23, 1951 DOA: 08/31/2014  PCP: [name], MD  Admit date: 08/31/2014 Discharge date: 09/04/2014  Time spent: 25 minutes  Recommendations for Outpatient Follow-up:  1. Follow up with PCP in 1 week. Please follow GI pathogen panel and stool for O&p 2. Patient needs follow up chest CT with contrast in 3-4 weeks to evaluate right lung nodular opacity seen on CT this admission. Completes abx on 5/4   Discharge Diagnoses:  Principle problem Diarrhea  Active Problems:   Essential hypertension   Diastolic dysfunction- grade I   Chronic neck and back pain   Hypokalemia   Lobar pneumonia   GERD (gastroesophageal reflux disease)   Chronic narcotic dependence   Discharge Condition: fair  Diet recommendation: regular  Filed Weights   09/01/14 0448  Weight: 63.8 kg (140 lb 10.5 oz)    History of present illness:  64 y.o. female with past medical history of hypertension, anxiety, endometriosis, uterine fibroid, recurrent pneumonia thought to be aspiration from chronic narcotic abuse. Pt presented to [facility] ED with concern for diarrhea and poor appetite. She was found to have pneumonia on CXR in right lower lobe. She was started on azithromycin and rocephin.  Hospital Course:  Lobar pneumonia  - treated with Rocephin and azithromycin. - CT chest showed extensive perihilar ill-defined nodular opacities within the right lung with associated mediastinal and right hilar adenopathy, similar in presentation when compared with study in 02/2013. A follow-up contrast-enhanced chest CT in 3-4 weeks after treatment is recommended to ensure resolution and/or to establish a new baseline per radiology recommendations.  - cultures negative. Afebrile and wbc improved. -seen by swallow eval and findings consistent with eval / MBS from 2015 showing normal oropharyngeal swallowing function. EGD in 04/2014 showed grade A esophagitis, hiatal  hernia and some gastritis.  - no clear etiology for recurrent PNA. Should fllow up with her pulmonary and GI as outpt -will discharge on oral Levaquin to complete 5 day course.  Active Problems: Essential hypertension - Continue atenolol and diltiazem. She take Hctz only PRN for leg swelling.    GERD/ esophagitis continue PPI bid  Diarrhea - Unclear etiology. C.diff negative. Possibly related to recent abx use. GI pathogen panel pending and should be followed as outpt. O/P stool pending - Stool culture re-incubated for better growth.  symptoms resolved after adding lomotil   Essential HTN Stable. continue Home meds  Hypokalemia replenished   Depression - Stable - Continue Paxil  Dyslipidemia - Continue statin   vaginal candidiasis added clotrimazole cream   Diet: regular with thin liquids   Code Status: Full.  Family Communication: husband at bedside Disposition Plan: Home     Consultants:  none  Procedures:  none  Antibiotics:  Rocephin and azithromycin  Discharge Exam: Filed Vitals:   09/04/14 0519  BP: 162/78  Pulse: 66  Temp: 98 F (36.7 C)  Resp: 18     General: Elderly thin built female in NAD  HEENT: no pallor, moist mucosa, supple neck  chest: clear b/l, no added sounds  CVS: NS1&S2, no murmurs, rubs or gallop  GI: soft, NT, ND, BS+  Musculoskeletal: warm, no edema  CNS: alert and oriented.  Discharge Instructions    Current Discharge Medication List    START taking these medications   Details  clotrimazole (GYNE-LOTRIMIN) 1 % vaginal cream Place 1 Applicatorful vaginally at bedtime. Qty: 45 g, Refills: 0    levofloxacin (LEVAQUIN) 750 MG tablet Take 1  tablet (750 mg total) by mouth daily. Qty: 2 tablet, Refills: 0    loperamide (IMODIUM) 2 MG capsule Take 1 capsule (2 mg total) by mouth as needed for diarrhea or loose stools. Qty: 30 capsule, Refills: 0      CONTINUE these medications which have NOT  CHANGED   Details  albuterol (PROVENTIL HFA;VENTOLIN HFA) 108 (90 BASE) MCG/ACT inhaler 2 puffs every 4-6 hours as needed for wheezing/shortness of breath Qty: 8 g, Refills: 5   Associated Diagnoses: CAP (community acquired pneumonia)    ASTRAGALUS PO Take 2 tablets by mouth 2 (two) times daily.     atenolol (TENORMIN) 100 MG tablet Take 100 mg by mouth daily.    cholecalciferol (VITAMIN D) 1000 UNITS tablet Take 1 tablet (1,000 Units total) by mouth daily. Qty: 100 tablet, Refills: 3    diazepam (VALIUM) 5 MG tablet Take 5 mg by mouth every 12 (twelve) hours as needed for anxiety or muscle spasms.  Refills: 2    diltiazem (TIAZAC) 180 MG 24 hr capsule Take 1 capsule (180 mg total) by mouth daily. Qty: 180 capsule, Refills: 3    gabapentin (NEURONTIN) 300 MG capsule Take 2 capsules by mouth at bedtime.     hydrochlorothiazide (MICROZIDE) 12.5 MG capsule Take 1 capsule (12.5 mg total) by mouth daily. Qty: 90 capsule, Refills: 0    oxymorphone (OPANA) 10 MG tablet Take 10 mg by mouth daily as needed for pain. Morning or afternoon    pantoprazole (PROTONIX) 40 MG tablet Take 1 tablet (40 mg total) by mouth 2 (two) times daily. Qty: 60 tablet, Refills: 11    PARoxetine (PAXIL) 40 MG tablet TAKE 1 TABLET (40 MG TOTAL) BY MOUTH 2 (TWO) TIMES DAILY. Qty: 60 tablet, Refills: 5    simvastatin (ZOCOR) 40 MG tablet TAKE 1/2 TABLET BY MOUTH ONCE DAILY Qty: 45 tablet, Refills: 3    SUMAtriptan (IMITREX) 100 MG tablet Take 1 tablet (100 mg total) by mouth once. May repeat in 2 hours if headache persists or recurs. Qty: 12 tablet, Refills: 5    traZODone (DESYREL) 100 MG tablet TAKE 1 TABLET BY MOUTH AT BEDTIME Qty: 30 tablet, Refills: 5    ALPRAZolam (XANAX) 0.25 MG tablet We recommend using 1/2 or your 0.5 mg tabs every 6 hours as needed for anxiety Qty: 30 tablet, Refills: 0      STOP taking these medications     nitrofurantoin, macrocrystal-monohydrate, (MACROBID) 100 MG capsule         Allergies  Allergen Reactions  . Penicillins Shortness Of Breath and Rash  . Morphine And Related Swelling    Severe pain Morphine only; other opioids OK   Follow-up Information    Follow up with [name], MD. Schedule an appointment as soon as possible for a visit in 1 week.   Specialty:  Internal Medicine   Contact information:   [place] [id number] [phone]        The results of significant diagnostics from this hospitalization (including imaging, microbiology, ancillary and laboratory) are listed below for reference.    Significant Diagnostic Studies: Ct Abdomen Pelvis Wo Contrast  08/17/2014   CLINICAL DATA:  Left flank pain today, gross hematuria. Symptoms since this afternoon.  EXAM: CT ABDOMEN AND PELVIS WITHOUT CONTRAST  TECHNIQUE: Multidetector CT imaging of the abdomen and pelvis was performed following the standard protocol without IV contrast.  COMPARISON:  CT 11/23/2009  FINDINGS: Probable scarring in the right lower lobe. Minimal  bronchiectasis adjacent to scarring. Minimal subsegmental atelectasis in the medial left lower lobe. Heart is at the upper limits of normal in size  Left-sided phlebolith in the pelvis is adjacent to the left ureter, and is unchanged compared to prior CT. There are no ureteric stones. No left urolithiasis. No hydronephrosis or renal obstruction. Punctate calcification medial upper right kidney is likely a nonobstructing stone. Right ureter is decompressed, no ureteral stone. No right hydronephrosis.  The urinary bladder is minimally distended, however mild bladder wall thickening and perivesicular inflammatory change.  No focal hepatic lesion on noncontrast exam. The gallbladder is minimally distended. There is question of extrahepatic biliary ductal dilatation, the distal common bile duct measures 9 mm. This is suboptimally defined without contrast. There is questionable proximal pancreatic ductal dilatation 4 mm. No  surrounding inflammatory change. Limited pancreatic assessment given lack of contrast. The unenhanced spleen and adrenal glands are normal.  The stomach is physiologically distended. There are no dilated or thickened bowel loops. The appendix is normal. Small to moderate volume of colonic stool without colonic wall thickening.  Abdominal aorta is normal in caliber minimal atherosclerosis. No retroperitoneal adenopathy.  Within the pelvis the uterus is not seen presumably surgically absent. No adnexal mass. No pelvic free fluid.  There are no acute or suspicious osseous abnormalities. There is degenerative and postsurgical change in the lumbar spine.  IMPRESSION: 1. Nonobstructing right renal calculus, no obstructive uropathy. Calcification in the left pelvis is adjacent to the ureter and represents a phlebolith, unchanged from prior exam. No left hydronephrosis. 2. Diffuse urinary bladder thickening and suggestion of mild perivesicular inflammatory change. The findings suggest cystitis, correlation with urinalysis recommended. 3. Suggestion of extrahepatic biliary ductal dilatation, and possible pancreatic ductal dilatation, incompletely characterized without contrast. Recommend correlation with LFTs. Nonemergent MRCP and MRI of the pancreas recommended further evaluation.   Electronically Signed   By: Jeb Levering M.D.   On: 08/17/2014 21:33   Dg Chest 2 View  08/31/2014   CLINICAL DATA:  Shortness of breath for 2 weeks, cough, history of pneumonia. Recent urinary tract infection. History of chronic renal failure and recurrent pneumonia.  EXAM: CHEST  2 VIEW  COMPARISON:  Chest radiograph March 19, 2014  FINDINGS: RIGHT lower lobe alveolar airspace opacity with mild interstitial prominence throughout the RIGHT lung. No pleural effusion. Cardiac silhouette is upper limits of normal in size, mediastinal silhouette is nonsuspicious. No pneumothorax. Soft tissue planes and included osseous structure  nonsuspicious, scoliosis.  IMPRESSION: RIGHT lower lobe bronchopneumonia. Followup PA and lateral chest X-ray is recommended in 3-4 weeks following trial of antibiotic therapy to ensure resolution and exclude underlying malignancy.  Borderline cardiomegaly.   Electronically Signed   By: Elon Alas   On: 08/31/2014 22:44   Ct Chest W Contrast  09/01/2014   CLINICAL DATA:  Weakness.  History of pneumonia.  EXAM: CT CHEST WITH CONTRAST  TECHNIQUE: Multidetector CT imaging of the chest was performed during intravenous contrast administration.  CONTRAST:  165mL OMNIPAQUE IOHEXOL 300 MG/ML  SOLN  COMPARISON:  08/31/2014; chest CT - 02/22/2013  FINDINGS: There are rather extensive perihilar predominant ill-defined heterogeneous slightly nodular airspace opacities scattered throughout the right lung worrisome for multifocal infection, including atypical etiologies, as was depicted on chest radiograph performed 08/31/2014. These findings appear similar though progressed since remote chest CT performed 02/22/2013.  There is a minimal amount of subsegmental atelectasis within the inferior segment of the lingula. No discrete air bronchograms. No pleural effusion or pneumothorax. The  central pulmonary airways appear widely patent.  Punctate granulomas are noted within the right lower lobe (image 33, series 4), the sequela of prior granulomatous infection. Evaluation for additional discrete pulmonary nodules is degraded secondary to concomitant superimposed presumed infection.  Mediastinal and right hilar adenopathy with index pretracheal lymph node measuring 1.1 cm in greatest short axis diameter (image 19, series 2), previously, 1.0 cm, index sub carinal nodal conglomeration measuring approximately 1.7 cm (image 29), previously, 1.3 cm, and index right suprahilar lymph node measuring approximately 1.5 cm (image 27). Left infrahilar lymph node is not enlarged by size criteria measuring 0.8 cm in diameter (image 28). No  axillary lymphadenopathy.  Normal heart size. No pericardial effusion. No central pulmonary embolism. Scattered atherosclerotic plaque within a normal caliber thoracic aorta.  Small hiatal hernia. Several tiny splenules are noted about the splenic hilum. Mild diffuse gallbladder wall thickening as was suspected on remote chest CT performed 02/2013. No definite pericholecystic stranding.  No acute or aggressive osseous abnormalities. Mild-to-moderate multilevel thoracic spine DDD. Regional soft tissues appear normal. Normal appearance of the thyroid gland.  IMPRESSION: Extensive perihilar predominant ill-defined heterogeneous slightly nodular opacities within the right lung with associated mediastinal and right hilar adenopathy, similar though progressed since the 02/2013 examination. While constellation of findings are suggestive of chronic atypical infection, a follow-up contrast-enhanced chest CT in 3-4 weeks after treatment is recommended to ensure resolution and/or to establish a new baseline. Alternatively, further evaluation could be performed with bronchoscopy as clinically indicated.   Electronically Signed   By: Sandi Mariscal M.D.   On: 09/01/2014 08:52    Microbiology: Recent Results (from the past 240 hour(s))  Urine culture     Status: None   Collection Time: 08/31/14  5:38 PM  Result Value Ref Range Status   Specimen Description URINE, CLEAN CATCH  Final   Special Requests NONE  Final   Colony Count   Final    9,000 COLONIES/ML Performed at [facility]    Culture   Final    INSIGNIFICANT GROWTH Performed at [facility]    Report Status 09/02/2014 FINAL  Final  MRSA PCR Screening     Status: None   Collection Time: 09/01/14  6:07 AM  Result Value Ref Range Status   MRSA by PCR NEGATIVE NEGATIVE Final    Comment:        The GeneXpert MRSA Assay (FDA approved for NASAL specimens only), is one component of a comprehensive MRSA colonization surveillance program. It  is not intended to diagnose MRSA infection nor to guide or monitor treatment for MRSA infections.   Clostridium Difficile by PCR     Status: None   Collection Time: 09/01/14  7:16 AM  Result Value Ref Range Status   C difficile by pcr NEGATIVE NEGATIVE Final  Stool culture     Status: None (Preliminary result)   Collection Time: 09/01/14 10:00 AM  Result Value Ref Range Status   Specimen Description STOOL  Final   Special Requests NONE  Final   Culture   Final    NO SUSPICIOUS COLONIES, CONTINUING TO HOLD Performed at [facility]    Report Status PENDING  Incomplete     Labs: Basic Metabolic Panel:  Recent Labs Lab 08/31/14 1753 09/01/14 0521 09/03/14 0603 09/04/14 0500  NA 137 141 142 137  K 3.3* 3.7 2.9* 3.9  CL 101 105 104 106  CO2 25 27 27 26   GLUCOSE 80 69* 102* 94  BUN  13 11 <5* <5*  CREATININE 0.67 0.56 0.61 0.76  CALCIUM 8.9 8.7 8.9 8.6*   Liver Function Tests:  Recent Labs Lab 08/31/14 1753 09/01/14 0521  AST 21 17  ALT 12 10  ALKPHOS 97 82  BILITOT 1.8* 1.0  PROT 7.0 6.1  ALBUMIN 3.6 3.1*   No results for input(s): LIPASE, AMYLASE in the last 168 hours. No results for input(s): AMMONIA in the last 168 hours. CBC:  Recent Labs Lab 08/31/14 1753 09/01/14 0521 09/03/14 0603  WBC 12.7* 9.0 6.8  NEUTROABS 10.7*  --   --   HGB 12.1 10.3* 11.7*  HCT 36.9 32.3* 35.7*  MCV 80.7 81.0 79.3  PLT 238 193 230   Cardiac Enzymes: No results for input(s): CKTOTAL, CKMB, CKMBINDEX, TROPONINI in the last 168 hours. BNP: BNP (last 3 results) No results for input(s): BNP in the last 8760 hours.  ProBNP (last 3 results) No results for input(s): PROBNP in the last 8760 hours.  CBG: No results for input(s): GLUCAP in the last 168 hours.     SignedLouellen Molder  [facility] 09/04/2014, 10:50 AM

## 2014-09-04 NOTE — Care Management Note (Signed)
Case Management Note  Patient Details  Name: Bianca Haley MRN: [id number] Date of Birth: 01/01/1951  Subjective/Objective:      64 y/o f admitted w/PNA.              Action/Plan:From home.   Expected Discharge Date:  09/04/14               Expected Discharge Plan:  Home/Self Care  In-House Referral:     Discharge planning Services  CM Consult  Post Acute Care Choice:    Choice offered to:     DME Arranged:    DME Agency:     HH Arranged:    HH Agency:     Status of Service:  Completed, signed off  Medicare Important Message Given:    Date Medicare IM Given:    Medicare IM give by:    Date Additional Medicare IM Given:    Additional Medicare Important Message give by:     If discussed at [place] of Stay Meetings, dates discussed:    Additional Comments:d/c home no needs or orders.  Dessa Phi, RN 09/04/2014, 1:34 PM

## 2014-09-05 ENCOUNTER — Telehealth: Payer: Self-pay | Admitting: *Deleted

## 2014-09-05 LAB — GI PATHOGEN PANEL BY PCR, STOOL
C difficile toxin A/B: NOT DETECTED
CRYPTOSPORIDIUM BY PCR: NOT DETECTED
Campylobacter by PCR: NOT DETECTED
E coli (ETEC) LT/ST: NOT DETECTED
E coli (STEC): NOT DETECTED
E coli 0157 by PCR: NOT DETECTED
G LAMBLIA BY PCR: NOT DETECTED
NOROVIRUS G1/G2: NOT DETECTED
ROTAVIRUS A BY PCR: NOT DETECTED
SHIGELLA BY PCR: NOT DETECTED
Salmonella by PCR: NOT DETECTED

## 2014-09-05 LAB — STOOL CULTURE

## 2014-09-05 NOTE — Telephone Encounter (Signed)
Transition Care Management Follow-up Telephone Call D/C 09/04/14  How have you been since you were released from the hospital? Pt states she is doing fairly well   Do you understand why you were in the hospital? YES   Do you understand the discharge instrcutions? YES  Items Reviewed:  Medications reviewed: YES  Allergies reviewed: YES  Dietary changes reviewed: NO  Referrals reviewed: No referral needed   Functional Questionnaire:   Activities of Daily Living (ADLs):   She states she are independent in the following: ambulation, bathing and hygiene, feeding, continence, grooming, toileting and dressing States she doesn't require assistance    Any transportation issues/concerns?: YES   Any patient concerns? NO   Confirmed importance and date/time of follow-up visits scheduled: YES due to pt going out of town, and md not available pt is seeing Dr. Jenny Reichmann on 09/18/14   Confirmed with patient if condition begins to worsen call PCP or go to the ER.

## 2014-09-18 ENCOUNTER — Ambulatory Visit (INDEPENDENT_AMBULATORY_CARE_PROVIDER_SITE_OTHER): Payer: Medicare Other | Admitting: Internal Medicine

## 2014-09-18 ENCOUNTER — Encounter: Payer: Self-pay | Admitting: Internal Medicine

## 2014-09-18 VITALS — BP 122/60 | HR 54 | Temp 98.2°F | Wt 141.0 lb

## 2014-09-18 DIAGNOSIS — R938 Abnormal findings on diagnostic imaging of other specified body structures: Secondary | ICD-10-CM

## 2014-09-18 DIAGNOSIS — R197 Diarrhea, unspecified: Secondary | ICD-10-CM

## 2014-09-18 DIAGNOSIS — R1319 Other dysphagia: Secondary | ICD-10-CM | POA: Insufficient documentation

## 2014-09-18 DIAGNOSIS — R1310 Dysphagia, unspecified: Secondary | ICD-10-CM | POA: Insufficient documentation

## 2014-09-18 DIAGNOSIS — J189 Pneumonia, unspecified organism: Secondary | ICD-10-CM | POA: Diagnosis not present

## 2014-09-18 DIAGNOSIS — F411 Generalized anxiety disorder: Secondary | ICD-10-CM

## 2014-09-18 DIAGNOSIS — E876 Hypokalemia: Secondary | ICD-10-CM

## 2014-09-18 DIAGNOSIS — R9389 Abnormal findings on diagnostic imaging of other specified body structures: Secondary | ICD-10-CM | POA: Insufficient documentation

## 2014-09-18 DIAGNOSIS — R1314 Dysphagia, pharyngoesophageal phase: Secondary | ICD-10-CM

## 2014-09-18 NOTE — Progress Notes (Signed)
Pre visit review using our clinic review tool, if applicable. No additional management support is needed unless otherwise documented below in the visit note. 

## 2014-09-18 NOTE — Assessment & Plan Note (Signed)
With RUL nodular type abnormality - ? Infectious vs other- for repeat CT in1 week (would be then 3 wks post last CT)

## 2014-09-18 NOTE — Assessment & Plan Note (Addendum)
?   Related to recurrent regurgitation or aspiration related to possible esoph dysphagia, I suspect possible esophageal dysmotility, likely neurogenic?  Current exam benign, cont to follow  Note:  Total time for pt hx, exam, review of record with pt in the room, determination of diagnoses and plan for further eval and tx is > 40 min, with over 50% spent in coordination and counseling of patient

## 2014-09-18 NOTE — Assessment & Plan Note (Signed)
Resolved, for f/u lab nest visit per pt preference

## 2014-09-18 NOTE — Assessment & Plan Note (Signed)
Rather marked today, cont current tx, declines counseling referral,  to f/u any worsening symptoms or concerns

## 2014-09-18 NOTE — Assessment & Plan Note (Signed)
?   esoph dysmotility, consider referral back to GI

## 2014-09-18 NOTE — Patient Instructions (Addendum)
Please continue all other medications as before, and refills have been done if requested.  Please have the pharmacy call with any other refills you may need.  Please continue your efforts at being more active, low cholesterol diet, and weight control.  Please keep your appointments with your specialists as you may have planned  You will be contacted regarding the referral for: CT chest   

## 2014-09-18 NOTE — Assessment & Plan Note (Signed)
Resolved, studies neg,  to f/u any worsening symptoms or concerns, ok to stop the immodium scheduled

## 2014-09-18 NOTE — Progress Notes (Signed)
Subjective:    Patient ID: Bianca Haley, female    DOB: 1951-01-24, 64 y.o.   MRN: [id number]  HPI  Here to f/u post hospn, diarrhea resolved, still taking the immodium regularly, afraid to stop. Denies worsening depressive symptoms, suicidal ideation, or panic; has ongoing anxiety,.  Recent hypokalemia resolved with inpatient tx/ IVF's.   All testing neg, including urine cx apr 29, gi pathogen panel, stool cx, ova and parasite, and c diff. Denies worsening reflux, abd pain, dysphagia, n/v, bowel change or blood, though has some sense of diffuculty swallowing. Is s/p EGD/colonosocpy dec 2015 with gastritis/duodenitis for PPI bid, still taking, very worried if it has resolved.   Has numerous questoins regarding recent recurrent pna; , thinks she might be aspirating since has had recurrent pna with trying to taking the PPI bid, as well as anti-reflux precuations, but has still had episodes of regurgitation. No abnormal swallow study except for possible esoph dysphagia noted on testing may 2015  Pt denies chest pain, increased sob or doe, wheezing, orthopnea, PND, increased LE swelling, palpitations, dizziness or syncope. No cough, fever, sob.  Due for /fu CT chest for RUL nodule - ? Infectious vs other Past Medical History  Diagnosis Date  . Hypertension   . Back pain   . Headache(784.0)   . Migraine   . Anemia   . Anxiety   . History of UTI   . History of endometriosis   . History of uterine fibroid   . H/O tinnitus     left  . GERD (gastroesophageal reflux disease)   . Chronic renal failure   . Recurrent pneumonia    Past Surgical History  Procedure Laterality Date  . Abdominal hysterectomy    . Bladder suspension    . Back surgery    . Colonoscopy  02/16/2008    normal  . Laparoscopy      for evauation of endometriosis    reports that she has never smoked. She has never used smokeless tobacco. She reports that she does not drink alcohol or use illicit drugs. family history  includes Emphysema in her mother; Heart disease in her mother. Allergies  Allergen Reactions  . Penicillins Shortness Of Breath and Rash  . Morphine And Related Swelling    Severe pain Morphine only; other opioids OK   Current Outpatient Prescriptions on File Prior to Visit  Medication Sig Dispense Refill  . albuterol (PROVENTIL HFA;VENTOLIN HFA) 108 (90 BASE) MCG/ACT inhaler 2 puffs every 4-6 hours as needed for wheezing/shortness of breath 8 g 5  . ALPRAZolam (XANAX) 0.25 MG tablet We recommend using 1/2 or your 0.5 mg tabs every 6 hours as needed for anxiety (Patient taking differently: Take 0.25 mg by mouth every 6 (six) hours as needed for anxiety. ) 30 tablet 0  . ASTRAGALUS PO Take 2 tablets by mouth 2 (two) times daily.     [name] atenolol (TENORMIN) 100 MG tablet Take 100 mg by mouth daily.    . cholecalciferol (VITAMIN D) 1000 UNITS tablet Take 1 tablet (1,000 Units total) by mouth daily. 100 tablet 3  . diazepam (VALIUM) 5 MG tablet Take 5 mg by mouth every 12 (twelve) hours as needed for anxiety or muscle spasms.   2  . diltiazem (TIAZAC) 180 MG 24 hr capsule Take 1 capsule (180 mg total) by mouth daily. 180 capsule 3  . gabapentin (NEURONTIN) 300 MG capsule Take 2 capsules by mouth at bedtime.     . hydrochlorothiazide (  MICROZIDE) 12.5 MG capsule Take 1 capsule (12.5 mg total) by mouth daily. (Patient taking differently: Take 12.5 mg by mouth daily as needed (for leg swelling). ) 90 capsule 0  . loperamide (IMODIUM) 2 MG capsule Take 1 capsule (2 mg total) by mouth as needed for diarrhea or loose stools. 30 capsule 0  . oxymorphone (OPANA) 10 MG tablet Take 10 mg by mouth daily as needed for pain. Morning or afternoon    . pantoprazole (PROTONIX) 40 MG tablet Take 1 tablet (40 mg total) by mouth 2 (two) times daily. 60 tablet 11  . PARoxetine (PAXIL) 40 MG tablet TAKE 1 TABLET (40 MG TOTAL) BY MOUTH 2 (TWO) TIMES DAILY. 60 tablet 5  . simvastatin (ZOCOR) 40 MG tablet TAKE 1/2 TABLET BY  MOUTH ONCE DAILY 45 tablet 3  . SUMAtriptan (IMITREX) 100 MG tablet Take 1 tablet (100 mg total) by mouth once. May repeat in 2 hours if headache persists or recurs. 12 tablet 5  . traZODone (DESYREL) 100 MG tablet TAKE 1 TABLET BY MOUTH AT BEDTIME 30 tablet 5  . [DISCONTINUED] diltiazem (CARDIZEM CD) 180 MG 24 hr capsule TAKE ONE CAPSULE EVERY DAY 30 capsule 5   No current facility-administered medications on file prior to visit.    Review of Systems  Constitutional: Negative for unusual diaphoresis or night sweats HENT: Negative for ringing in ear or discharge Eyes: Negative for double vision or worsening visual disturbance.  Respiratory: Negative for choking and stridor.   Gastrointestinal: Negative for vomiting or other signifcant bowel change Genitourinary: Negative for hematuria or change in urine volume.  Musculoskeletal: Negative for other MSK pain or swelling Skin: Negative for color change and worsening wound.  Neurological: Negative for tremors and numbness other than noted  Psychiatric/Behavioral: Negative for decreased concentration or agitation other than above       Objective:   Physical Exam BP 122/60 mmHg  Pulse 54  Temp(Src) 98.2 F (36.8 C) (Oral)  Wt 141 lb 0.6 oz (63.975 kg)  SpO2 94% VS noted,  Constitutional: Pt appears in no significant distress HENT: Head: NCAT.  Right Ear: External ear normal.  Left Ear: External ear normal.  Eyes: . Pupils are equal, round, and reactive to light. Conjunctivae and EOM are normal Neck: Normal range of motion. Neck supple.  Cardiovascular: Normal rate and regular rhythm.   Pulmonary/Chest: Effort normal and breath sounds without rales or wheezing.  Abd:  Soft, NT, ND, + BS Neurological: Pt is alert. Not confused , motor grossly intact Skin: Skin is warm. No rash, no LE edema Psychiatric: Pt behavior is normal. No agitation. 2+ nervous      Assessment & Plan:

## 2014-09-26 ENCOUNTER — Ambulatory Visit: Payer: Medicare Other | Admitting: Internal Medicine

## 2014-09-27 ENCOUNTER — Ambulatory Visit (INDEPENDENT_AMBULATORY_CARE_PROVIDER_SITE_OTHER)
Admission: RE | Admit: 2014-09-27 | Discharge: 2014-09-27 | Disposition: A | Discharge status: Home / Self Care | Payer: Medicare Other | Source: Ambulatory Visit | Attending: Internal Medicine | Admitting: Internal Medicine

## 2014-09-27 DIAGNOSIS — R9389 Abnormal findings on diagnostic imaging of other specified body structures: Secondary | ICD-10-CM

## 2014-09-27 DIAGNOSIS — R938 Abnormal findings on diagnostic imaging of other specified body structures: Secondary | ICD-10-CM | POA: Diagnosis not present

## 2014-09-27 MED ORDER — IOHEXOL 300 MG/ML  SOLN
80.0000 mL | Freq: Once | INTRAMUSCULAR | Status: AC | PRN
  Administered 2014-09-27: 80 mL via INTRAVENOUS

## 2014-10-02 ENCOUNTER — Other Ambulatory Visit: Payer: Self-pay | Admitting: Internal Medicine

## 2014-10-02 DIAGNOSIS — R918 Other nonspecific abnormal finding of lung field: Secondary | ICD-10-CM

## 2014-10-11 ENCOUNTER — Institutional Professional Consult (permissible substitution): Payer: Medicare Other | Admitting: Internal Medicine

## 2014-10-15 ENCOUNTER — Encounter: Payer: Self-pay | Admitting: Adult Health

## 2014-10-15 ENCOUNTER — Ambulatory Visit (INDEPENDENT_AMBULATORY_CARE_PROVIDER_SITE_OTHER): Payer: Medicare Other | Admitting: Adult Health

## 2014-10-15 VITALS — BP 155/85 | HR 76 | Ht 65.0 in | Wt 130.0 lb

## 2014-10-15 DIAGNOSIS — I10 Essential (primary) hypertension: Secondary | ICD-10-CM | POA: Diagnosis not present

## 2014-10-15 DIAGNOSIS — J189 Pneumonia, unspecified organism: Secondary | ICD-10-CM

## 2014-10-15 NOTE — Assessment & Plan Note (Signed)
Blood pressure is elevated during her visit today Patient did take Aleve this morning. Advised to avoid non-steroidal's of possible Follow-up with her primary care physician for blood pressure management

## 2014-10-15 NOTE — Assessment & Plan Note (Signed)
Recurrent pneumonia , possibly secondary to chronic narcotic use and esophageal dysmotility Patient has recently had an episode of an acute pneumonia in April that does clinically appear to be improving and repeat CT chest does show improvement. She does have residual mediastinal and hilar adenopathy. Previous CT chest done during her critical illness in September 2015 with recurrent pneumonia. Did show adenopathy.  We'll need to follow this. CT-repeat in 2 months   Plan Call for first signs of pneumonia -cough, fever, shortness of breath, confusion  Albuterol as needed only for wheezing. GERD diet  Avoid eating 3 hours before bedtime  Do not lie down after eating .  Limit pain medications as much as possible as they make you sleepy and more prone to aspiration.  Follow up CT chest in 2 months -PNA and adenopathy.  Follow up Dr. Elsworth Soho  In 2 months and As needed   Please contact office for sooner follow up if symptoms do not improve or worsen or seek emergency care  Check with Family MD for blood pressure  Avoid Aleve  And low salt diet .

## 2014-10-15 NOTE — Progress Notes (Signed)
Subjective:    Patient ID: Bianca Haley, female    DOB: [birth year], 64 y.o.   MRN: [id number]  HPI  27 never smoker first seen for chronic cough x 1mnths on 02/21/13   Significant tests/ events  She was diagnosed with RLL community acquired pneumonia on 01/04/13 & treated with levaquin with clearing of infiltrate on 01/26/13.  Admitted 02/21/13 for recurrent RLL PNA , hypotension. CT chest showed RML/RLL pneumonia and what appeared to be some progression of bronchiectasis (first noted 2011)   Esophagram was negative. IgG,IgaA and IgM levels were low, but not immunosuppressed.   06/2013 - outpt Rx for pna  09/2013  admitted for recurrent PNA /aspiration pneumonitis . CXR showed R>L consolidation c/w multifocal PNA.  A MBSS this admit without dysphagia but raised concerns of possible primary esophageal dysmotility as barium pill stopped mid esophagus requiring additional thin barium to facilitate transit to stomach , PPI to BID prior to discharge.  CXR FU showed near complete resolution of bilateral infiltrates.     The patient was visiting family out of state in [place], [place], where she was admitted for severe pneumonia. she arrived to her son's home and had to sleep on a air mattress. Within 2 days. Patient was having difficulty breathing, and confusion. She went to the emergency room was found to have severe hypoxic respiratory failure, and bilateral pneumonia. She required vent support. Condition was complicated by septic shock, required pressor support initially. . Patient was treated with IV antibiotics, nebulized bronchodilators. She improved with treatment. CT chest showed no pulmonary embolism. + dense bilateral, lower lobe consolidations, especially along the right middle lobe and left upper lobe. She had mediastinal lymphadenopathy. A 2-D echo showed a normal left ventricular systolic function and ejection fracture 67%. Pulmonary artery pressures were elevated at 43.  Patient was extubated, and weaned off of oxygen therapy. Prior to discharge. She was treated with additional 3 days of LevaquinPatient was told that she had a possible breast lump. She had a mammogram recently, prior to her trip. Encouraged her to speak with her GYN regarding any further testing that might be needed.  She takes opana -last dose by 4pm She takes trazodone 100 mg& neurontin 900 mg at bedtime She also takes Valium as a muscle relaxant and Xanax as needed for anxiety Denies any dyspnea, wheezing, tightness, congestion. no new complaints.  No overt reflux .   Chest x-ray showed right lower lobe infiltrate  06/19/14 Follow up :recurrent PNA . Presents for a follow up. Patient says that she developed cough and congestion last week. She was called in  Doxycycline. She says that her symptoms are much improved. Cough and congestion have resolved. She denies any hemoptysis, chest pain, orthopnea, PND or leg swelling. She has a tendency towards recurrent pneumonia. Last cxr 03/19/14 with resolved PNA on cxr .  She has adjusted all her sedating and pain medications. Patient is leaving on vacation next week for [place] She did undergo endoscopy in December 2015 that showed mild esophagitis. Path neg for malignancy and H. Pylori .  She remains on Protonix. And reflux diet. > No changes  10/15/2014 follow-up recurrent pneumonia. Patient returns for a follow-up for her recurrent pneumonia Patient was admitted in late April for pneumonia CT chest showed extensive nodular pace of these in the right lung with associated mediastinal and hilar adenopathy. She was treated with anabiotic with  clinical improvement. Patient is feeling better , however, is very concerned about her CT  scan. Repeat CT chest on May 26 shows significant improvement in her right lung pneumonia with minimal residual pneumonia in the right lung. She has persistent mediastinal adenopathy with no significant change in the  bilateral hilar adenopathy. Working diagnosis of her recurrent pneumonia. Has been chronic narcotic use and esophageal dysmotility. She has been seen by GI.  She has decreased her narcotic and sedating medications. She tries to take her chronic pain medicines with the last dose before 4 PM. She takes trazodone 100 mg& neurontin 900 mg at bedtime She also takes Valium as a muscle relaxant and Xanax as needed for anxiety-she tries not to take at night.  She denies any episodes of nausea, vomiting or reflux events prior to her most recent pneumonia. She did have a GI illness with  diarrhea 2 weeks prior to her pneumonia in April.  She denies any hemoptysis, orthopnea, PND , or unintentional weight loss.  We reviewed her x-rays and CT scans over the last year. With chest x-ray in November showing resolution of right-sided pneumonia from last fall. Residual scarring in the right middle lobe.   Review of Systems   Constitutional:   No  weight loss, night sweats,  Fevers, chills,  +fatigue, or  lassitude.  HEENT:   No headaches,  Difficulty swallowing,  Tooth/dental problems, or  Sore throat,                No sneezing, itching, ear ache, nasal congestion, post nasal drip,   CV:  No chest pain,  Orthopnea, PND, swelling in lower extremities, anasarca, dizziness, palpitations, syncope.   GI  No heartburn, indigestion, abdominal pain, nausea, vomiting, diarrhea, change in bowel habits, loss of appetite, bloody stools.   Resp: No shortness of breath with exertion or at rest.  No excess mucus, no productive cough,  No non-productive cough,  No coughing up of blood.  No change in color of mucus.  No wheezing.  No chest wall deformity  Skin: no rash or lesions.  GU: no dysuria, change in color of urine, no urgency or frequency.  No flank pain, no hematuria         Objective:   Physical Exam  Gen. Pleasant, well-nourished, in no distress ENT - no lesions, no post nasal drip Neck: No  JVD, no thyromegaly, no carotid bruits Lungs: no use of accessory muscles, no dullness to percussion, clear without rales or rhonchi  Cardiovascular: Rhythm regular, heart sounds  normal, no murmurs or gallops, no peripheral edema Musculoskeletal: No deformities, no cyanosis or clubbing        Assessment & Plan:

## 2014-10-15 NOTE — Patient Instructions (Addendum)
Call for first signs of pneumonia -cough, fever, shortness of breath, confusion  Albuterol as needed only for wheezing. GERD diet  Avoid eating 3 hours before bedtime  Do not lie down after eating .  Limit pain medications as much as possible as they make you sleepy and more prone to aspiration.  Follow up CT chest in 2 months -PNA and adenopathy.  Follow up Dr. Elsworth Soho  In 2 months and As needed   Please contact office for sooner follow up if symptoms do not improve or worsen or seek emergency care  Check with Family MD for blood pressure  Avoid Aleve  And low salt diet .

## 2014-10-16 ENCOUNTER — Telehealth: Payer: Self-pay

## 2014-10-16 NOTE — Telephone Encounter (Signed)
10/15/14 Almyra Free sent disc up to [place] on 2nd floor.Britt Bottom

## 2014-10-17 NOTE — Progress Notes (Signed)
Reviewed & agree with plan  

## 2014-10-25 ENCOUNTER — Telehealth: Payer: Self-pay

## 2014-10-25 NOTE — Telephone Encounter (Signed)
10/25/14 [place] Disc received back on [employer] and filed back on shelf.[name]

## 2014-11-04 ENCOUNTER — Other Ambulatory Visit: Payer: Self-pay | Admitting: Internal Medicine

## 2014-11-16 ENCOUNTER — Other Ambulatory Visit: Payer: Self-pay | Admitting: Internal Medicine

## 2014-12-02 ENCOUNTER — Other Ambulatory Visit: Payer: Self-pay | Admitting: Internal Medicine

## 2014-12-04 ENCOUNTER — Telehealth: Payer: Self-pay | Admitting: *Deleted

## 2014-12-04 NOTE — Telephone Encounter (Signed)
Pt states her insurance is not wanting to cover her paxil 40 mg twice a day. Pt states she has been taking med for years and she need to take twice a day. Pt is needing prior authorization on her paxil. Forwarding msg to md CMA for PA....Johny Chess

## 2014-12-05 NOTE — Telephone Encounter (Signed)
PA initiated via CoverMyMeds. Mem ID: [id number]

## 2014-12-05 NOTE — Telephone Encounter (Signed)
Mark from [employer] Paroxetine PA is approved effective today x 1 year. Member has already been notified by Elta Guadeloupe with [employer].

## 2014-12-11 ENCOUNTER — Other Ambulatory Visit: Payer: Medicare Other

## 2014-12-21 ENCOUNTER — Ambulatory Visit (INDEPENDENT_AMBULATORY_CARE_PROVIDER_SITE_OTHER)
Admission: RE | Admit: 2014-12-21 | Discharge: 2014-12-21 | Disposition: A | Discharge status: Home / Self Care | Payer: Medicare Other | Source: Ambulatory Visit | Attending: Pulmonary Disease | Admitting: Pulmonary Disease

## 2014-12-21 DIAGNOSIS — J189 Pneumonia, unspecified organism: Secondary | ICD-10-CM | POA: Diagnosis not present

## 2014-12-24 ENCOUNTER — Ambulatory Visit (INDEPENDENT_AMBULATORY_CARE_PROVIDER_SITE_OTHER): Payer: Medicare Other | Admitting: Pulmonary Disease

## 2014-12-24 ENCOUNTER — Encounter: Payer: Self-pay | Admitting: Pulmonary Disease

## 2014-12-24 VITALS — BP 128/78 | HR 52 | Ht 65.5 in | Wt 142.0 lb

## 2014-12-24 DIAGNOSIS — J189 Pneumonia, unspecified organism: Secondary | ICD-10-CM

## 2014-12-24 DIAGNOSIS — R599 Enlarged lymph nodes, unspecified: Secondary | ICD-10-CM | POA: Diagnosis not present

## 2014-12-24 DIAGNOSIS — R590 Localized enlarged lymph nodes: Secondary | ICD-10-CM

## 2014-12-24 NOTE — Assessment & Plan Note (Signed)
Pneumonia is resolved Lymph nodes are still present but decreased in size Call us at first signs of pneumonia

## 2014-12-24 NOTE — Patient Instructions (Signed)
Pneumonia is resolved Lymph nodes are still present but decreased in size Call us at first signs of pneumonia

## 2014-12-24 NOTE — Progress Notes (Signed)
   Subjective:    Patient ID: Bianca Haley, female    DOB: 25-Nov-1950, 64 y.o.   MRN: [id number]  HPI  64/F never smoker with recurrent pneumonia attributed to aspiration ? due to narcotics & esophageal dysmotility  -first seen for chronic cough x 86mnths on 02/21/13  Works parttime @ [employer]  Chief Complaint  Patient presents with  . Follow-up    CT done 12/21/2014; patient feeling better.  Energy level back.  no concerns.   Back to baseline, 'best ' she has felt in a while She takes opana - by 4pm She takes trazodone 100 mg& neurontin 900 mg at bedtime She also takes Valium as a muscle relaxant and Xanax as needed for anxiety Denies any dyspnea, wheezing, tightness, congestion. no new complaints.  No overt reflux .   Significant tests/ events  She was diagnosed with RLL community acquired pneumonia on 01/04/13 & treated with levaquin with clearing of infiltrate on 01/26/13.  Admitted 02/21/13 for recurrent RLL PNA , hypotension. CT chest showed RML/RLL pneumonia and what appeared to be some progression of bronchiectasis (first noted 2011)   Esophagram was negative. IgG,IgaA and IgM levels were low, but not immunosuppressed.   06/2013 - outpt Rx for pna  09/2013 admitted for recurrent PNA /aspiration pneumonitis . CXR showed R>L consolidation c/w multifocal PNA.  MBS this admit without dysphagia but raised concerns of possible primary esophageal dysmotility as barium pill stopped mid esophagus requiring additional thin barium to facilitate transit to stomach , PPI to BID prior to discharge.  CXR FU showed near complete resolution of bilateral infiltrates.   04/2014 EGD that showed mild esophagitis.   The patient was visiting family out of state in [place], [place], where she was admitted for severe pneumonia, required vent support.  CT chest showed no pulmonary embolism. + dense bilateral, lower lobe consolidations, especially along the right middle lobe and left upper  lobe. She had mediastinal lymphadenopathy. A 2-D echo showed a normal left ventricular systolic function and ejection fracture 67%. Pulmonary artery pressures were elevated at 43.   08/2014 admitted for pneumonia CT chest showed extensive nodular pace of these in the right lung with associated mediastinal and hilar adenopathy.  09/2014 Repeat CT chest shows significant improvement in her right lung pneumonia with minimal residual pneumonia in the right lung.She had persistent mediastinal adenopathy with no significant change in the bilateral hilar adenopathy.  CT chest 12/2014 resolution of infiltrates and inflammation. There are some persistent scattered sub 4 mm pulmonary nodules and areas of bronchiectasis. Persistent but slightly decreased mediastinal lymphadenopathy.  Review of Systems neg for any significant sore throat, dysphagia, itching, sneezing, nasal congestion or excess/ purulent secretions, fever, chills, sweats, unintended wt loss, pleuritic or exertional cp, hempoptysis, orthopnea pnd or change in chronic leg swelling. Also denies presyncope, palpitations, heartburn, abdominal pain, nausea, vomiting, diarrhea or change in bowel or urinary habits, dysuria,hematuria, rash, arthralgias, visual complaints, headache, numbness weakness or ataxia.     Objective:   Physical Exam  Gen. Pleasant, well-nourished, in no distress ENT - no lesions, no post nasal drip Neck: No JVD, no thyromegaly, no carotid bruits Lungs: no use of accessory muscles, no dullness to percussion, clear without rales or rhonchi  Cardiovascular: Rhythm regular, heart sounds  normal, no murmurs or gallops, no peripheral edema Musculoskeletal: No deformities, no cyanosis or clubbing        Assessment & Plan:

## 2014-12-25 DIAGNOSIS — R590 Localized enlarged lymph nodes: Secondary | ICD-10-CM | POA: Insufficient documentation

## 2014-12-25 NOTE — Assessment & Plan Note (Signed)
Rpt CT in 6-12 months

## 2014-12-26 ENCOUNTER — Telehealth: Payer: Self-pay | Admitting: Internal Medicine

## 2014-12-26 NOTE — Telephone Encounter (Signed)
Patient is in lobby.  She thinks she has a UTI.  She is requesting lab orders.

## 2014-12-27 ENCOUNTER — Telehealth: Payer: Self-pay | Admitting: Internal Medicine

## 2014-12-27 NOTE — Telephone Encounter (Signed)
Received records from [facility] forwarded 3 pages to Dr. Tyrone Apple Plotnikov 12/27/14 fbg.

## 2014-12-27 NOTE — Telephone Encounter (Signed)
Patient left and went to her GYN. I called pt today to f/u. No answer. Left detailed mess informing pt to call us if she needs [place].

## 2015-01-15 ENCOUNTER — Other Ambulatory Visit: Payer: Self-pay | Admitting: Internal Medicine

## 2015-01-22 ENCOUNTER — Telehealth: Payer: Self-pay

## 2015-01-22 NOTE — Telephone Encounter (Signed)
PA started via cover my meds.  PA has been approved.

## 2015-03-06 ENCOUNTER — Telehealth: Payer: Self-pay | Admitting: Pulmonary Disease

## 2015-03-06 NOTE — Telephone Encounter (Signed)
Spoke with pt. Reports increased coughing and wheezing. Cough is producing yellow mucus. Denies chest tightness, SOB or fever. Has been taking Deslym with no relief. Would like something to be called in.  RA - please advise.  Thanks.

## 2015-03-06 NOTE — Telephone Encounter (Signed)
Spoke with patient-she will come in for acute OV with PM at 9:30am 03-07-15. Nothing more needed at this time.

## 2015-03-06 NOTE — Telephone Encounter (Signed)
Sending to doc of the day as RA is unavailable to answer this message today. MR please advise on recs.  Thanks!

## 2015-03-07 ENCOUNTER — Encounter: Payer: Self-pay | Admitting: Pulmonary Disease

## 2015-03-07 ENCOUNTER — Ambulatory Visit (INDEPENDENT_AMBULATORY_CARE_PROVIDER_SITE_OTHER): Payer: Medicare Other | Admitting: Pulmonary Disease

## 2015-03-07 ENCOUNTER — Ambulatory Visit (INDEPENDENT_AMBULATORY_CARE_PROVIDER_SITE_OTHER)
Admission: RE | Admit: 2015-03-07 | Discharge: 2015-03-07 | Disposition: A | Discharge status: Home / Self Care | Payer: Medicare Other | Source: Ambulatory Visit | Attending: Pulmonary Disease | Admitting: Pulmonary Disease

## 2015-03-07 VITALS — BP 122/88 | HR 70 | Temp 98.4°F | Ht 65.5 in | Wt 142.2 lb

## 2015-03-07 DIAGNOSIS — R053 Chronic cough: Secondary | ICD-10-CM

## 2015-03-07 DIAGNOSIS — R05 Cough: Secondary | ICD-10-CM

## 2015-03-07 MED ORDER — DEXTROMETHORPHAN-GUAIFENESIN 10-100 MG/5ML PO SYRP: 5.0000 mL | ORAL_SOLUTION | Freq: Two times a day (BID) | ORAL | Status: DC | PRN

## 2015-03-07 MED ORDER — AZITHROMYCIN 250 MG PO TABS
ORAL_TABLET | ORAL | Status: DC
Start: 2015-03-07 — End: 2015-06-19

## 2015-03-07 NOTE — Progress Notes (Signed)
Subjective:    Patient ID: Bianca Haley, female    DOB: [birth year], 64 y.o.   MRN: [id number]  HPI Acute visit for respiratory tract infection.  Bianca Haley is a 64 year old with history of recurrent pneumonias secondary to narcotic use, esophageal dysmotility, GERD. She is a patient of Dr. Elsworth Soho. She is complaining of cold, congestion, cough with yellowish sputum production for the past 3 days. She denies any dyspnea, fevers, chills, hemoptysis.   Past Medical History  Diagnosis Date  . Hypertension   . Back pain   . Headache(784.0)   . Migraine   . Anemia   . Anxiety   . History of UTI   . History of endometriosis   . History of uterine fibroid   . H/O tinnitus     left  . GERD (gastroesophageal reflux disease)   . Chronic renal failure   . Recurrent pneumonia     Current outpatient prescriptions:  .  albuterol (PROVENTIL HFA;VENTOLIN HFA) 108 (90 BASE) MCG/ACT inhaler, 2 puffs every 4-6 hours as needed for wheezing/shortness of breath, Disp: 8 g, Rfl: 5 .  ASTRAGALUS PO, Take 2 tablets by mouth 2 (two) times daily. , Disp: , Rfl:  .  atenolol (TENORMIN) 100 MG tablet, Take 100 mg by mouth daily., Disp: , Rfl:  .  diazepam (VALIUM) 5 MG tablet, Take 5 mg by mouth every 12 (twelve) hours as needed for anxiety or muscle spasms. , Disp: , Rfl: 2 .  diclofenac sodium (VOLTAREN) 1 % GEL, APPLY 2 GRAMS TO AFFECTED EXTERNAL AREAS 4 TIMES DAILY, Disp: , Rfl: 2 .  diltiazem (CARDIZEM CD) 180 MG 24 hr capsule, TAKE 1 CAPSULE (180 MG TOTAL) BY MOUTH DAILY., Disp: 180 capsule, Rfl: 2 .  gabapentin (NEURONTIN) 300 MG capsule, Take 2 capsules by mouth at bedtime. , Disp: , Rfl:  .  hydrochlorothiazide (MICROZIDE) 12.5 MG capsule, Take 1 capsule (12.5 mg total) by mouth daily., Disp: 90 capsule, Rfl: 0 .  loperamide (IMODIUM) 2 MG capsule, Take 1 capsule (2 mg total) by mouth as needed for diarrhea or loose stools., Disp: 30 capsule, Rfl: 0 .  meloxicam (MOBIC) 15 MG tablet, Take 15 mg  by mouth daily., Disp: , Rfl: 5 .  oxymorphone (OPANA) 10 MG tablet, Take 10 mg by mouth daily as needed for pain. Morning or afternoon, Disp: , Rfl:  .  pantoprazole (PROTONIX) 40 MG tablet, TAKE 1 TABLET BY MOUTH 2 TIMES DAILY., Disp: 60 tablet, Rfl: 11 .  PARoxetine (PAXIL) 40 MG tablet, TAKE 1 TABLET (40 MG TOTAL) BY MOUTH 2 (TWO) TIMES DAILY., Disp: 60 tablet, Rfl: 11 .  simvastatin (ZOCOR) 40 MG tablet, TAKE 1/2 TABLET BY MOUTH ONCE DAILY, Disp: 45 tablet, Rfl: 3 .  SUMAtriptan (IMITREX) 100 MG tablet, Take 1 tablet (100 mg total) by mouth once. May repeat in 2 hours if headache persists or recurs., Disp: 12 tablet, Rfl: 5 .  traZODone (DESYREL) 100 MG tablet, TAKE 1 TABLET BY MOUTH AT BEDTIME, Disp: 30 tablet, Rfl: 5 .  azithromycin (ZITHROMAX) 250 MG tablet, Take 2 today, then 1 daily until gone, Disp: 6 tablet, Rfl: 0 .  Dextromethorphan-Guaifenesin (ROBITUSSIN DM) 10-100 MG/5ML liquid, Take 5 mLs by mouth every 12 (twelve) hours as needed., Disp: 240 mL, Rfl: 0   Review of Systems Cough with yellow sputum production, rhinitis, chest congestion. Denies any fevers, chills, hemoptysis. Denies any chest pain, palpitations. Denies any nausea, vomiting, diarrhea, constipation. All other review of systems  negative.    Objective:   Physical Exam Blood pressure 122/88, pulse 70, temperature 98.4 F (36.9 C), temperature source Oral, height 5' 5.5" (1.664 m), weight 142 lb 3.2 oz (64.501 kg), SpO2 92 %.  Gen: No apparent distress, awake, oriented Neuro: No gross focal deficits. Neck: No JVD, lymphadenopathy, thyromegaly. RS: Bibasilar crackles. No wheeze CVS: S1-S2 heard, no murmurs rubs gallops. Abdomen: Soft, positive bowel sounds. Extremities: No edema.    Assessment & Plan:  Recurrent pneumonias. Here within the another resp tract infection.  - Check chest x-ray. - Z pack for 5 days. - Robitussin plus DM for cough suppression.  Follow-up with Dr. Elsworth Soho.  Bianca Garfinkel  MD [facility] Pager [phone] If no answer or after 3pm call: [phone] 03/07/2015, 12:13 PM

## 2015-03-07 NOTE — Patient Instructions (Signed)
We'll get a chest x-ray today. We'll start you on azithromycin for 5 days and Robitussin plus DM for cough suppression.  Follow-up with Dr. Elsworth Soho in 3 months.

## 2015-03-11 NOTE — Telephone Encounter (Signed)
Put note in appointment comments for patient to have CXR at return visit.  Nothing further needed. Closing encounter

## 2015-03-11 NOTE — Telephone Encounter (Signed)
She had recurrent aspiration in the past Needs chest x-ray on follow-up visit

## 2015-03-14 ENCOUNTER — Telehealth: Payer: Self-pay | Admitting: Pulmonary Disease

## 2015-03-14 MED ORDER — MOXIFLOXACIN HCL 400 MG PO TABS: 400.0000 mg | ORAL_TABLET | Freq: Every day | ORAL | Status: DC

## 2015-03-14 NOTE — Telephone Encounter (Signed)
Last seen on 03/07/15 by PM for acute visit  Patient Instructions     We'll get a chest x-ray today. We'll start you on azithromycin for 5 days and Robitussin plus DM for cough suppression.  Follow-up with Dr. Elsworth Soho in 3 months.   Called and spoke with pt. She c/o chest congestion that has moved to her lower chest. A non prod cough, wheezing and chest tightness at times. Denies any nasal congestion/pressure, fever, SOB, nausea or vomiting. She completed the zpak given on 03/07/15. She is still currently taking the robitussin and mucinex for the cough and congestion. She would like to see if PM could extend the zpak or offer another antibiotic. PM please advise Page sent at 11:53

## 2015-03-14 NOTE — Telephone Encounter (Signed)
Advised pt that we are still waiting to hear back from Dr. Vaughan Browner. She verbalized understanding.

## 2015-03-14 NOTE — Telephone Encounter (Signed)
Moxi 400 mg for 7 days

## 2015-03-14 NOTE — Telephone Encounter (Signed)
Pt calling to check on the status of med, please advise.Bianca Haley

## 2015-03-14 NOTE — Telephone Encounter (Signed)
Pt aware of recs per PM Avelox 400mg  called into pharmacy.  Nothing further needed.

## 2015-03-15 ENCOUNTER — Telehealth: Payer: Self-pay | Admitting: Pulmonary Disease

## 2015-03-15 NOTE — Telephone Encounter (Signed)
Patient says that she had to pay out of pocket $108.99 for the Avelox. Patient says that insurance company needs a letter showing that she has history of PNA and that we tried Zpak, but it doesn't work for her anymore.  She said that she needed the medication right away to avoid PNA.  She needs this stated in the letter. She is trying to get her insurance company to reimburse her for Avelox. Needs letter sent to [employer] Medicare:  [place], [place], [place] [address]; Member ID: [id number] (Member ID needs to be on letter)  Dr. Vaughan Browner - please advise if ok for letter.

## 2015-03-18 ENCOUNTER — Encounter: Payer: Self-pay | Admitting: *Deleted

## 2015-03-18 NOTE — Telephone Encounter (Signed)
OK for the letter.

## 2015-03-18 NOTE — Telephone Encounter (Signed)
Letter created, signed and mailed to [facility]. Copy of letter sent to patient via mychart Nothing further needed. Closing encounter

## 2015-03-21 ENCOUNTER — Telehealth: Payer: Self-pay | Admitting: Pulmonary Disease

## 2015-03-21 NOTE — Telephone Encounter (Signed)
Spoke with the pt  She still has her receipt from her purchase of Avelox  She will bring this by here tomorrow so that we can fax it  Will forward to [place] to keep a lookout for this  Thanks!

## 2015-03-21 NOTE — Telephone Encounter (Signed)
Pt returning calling an can be reached @ [phone].Bianca Haley

## 2015-03-21 NOTE — Telephone Encounter (Signed)
Spoke with Michelene Heady at [facility] She states pt will need to provide them with a receipt of payment for Avelox attached to the letter that we created regarding this This needs to be faxed to the number listed ( I verified) to the [place] for the pt

## 2015-03-22 NOTE — Telephone Encounter (Signed)
Pt dropped off proof of payment. Given to Parke Poisson

## 2015-03-22 NOTE — Telephone Encounter (Signed)
Received receipt Copy made and faxed along with signed copy of letter  The above has been faxed to [facility] at [phone]  [facility] TCB x1 for pt Will hold in my inbox to ensure follow up

## 2015-03-25 NOTE — Telephone Encounter (Signed)
Called made pt aware. She verbalized understanding and needed nothing further.  

## 2015-03-25 NOTE — Telephone Encounter (Signed)
Pt returned call - advised that I faxed her receipt and letter from PM to [facility].  Pt voiced her understanding.  On a different note, pt is asking for her cxr results from the 11.3.16 office visit with Dr Vaughan Browner.  Pt is requesting her results today.  PM is scheduled for 11pm Elink all week.  Will route to RA for interpretation.  Dr Elsworth Soho please advise, thank you.

## 2015-03-25 NOTE — Telephone Encounter (Signed)
lmtcb for pt.  

## 2015-03-25 NOTE — Telephone Encounter (Signed)
No pna

## 2015-03-26 ENCOUNTER — Telehealth: Payer: Self-pay | Admitting: Pulmonary Disease

## 2015-03-26 NOTE — Telephone Encounter (Signed)
Noted  

## 2015-03-31 IMAGING — CR DG CHEST 2V
2 series · 2 of 2 positions shown · non-contrast
Comparison: Non [DATE]

CLINICAL DATA: Followup pneumonia

EXAM:
CHEST  2 VIEW

[view not recorded (1 of 2)]
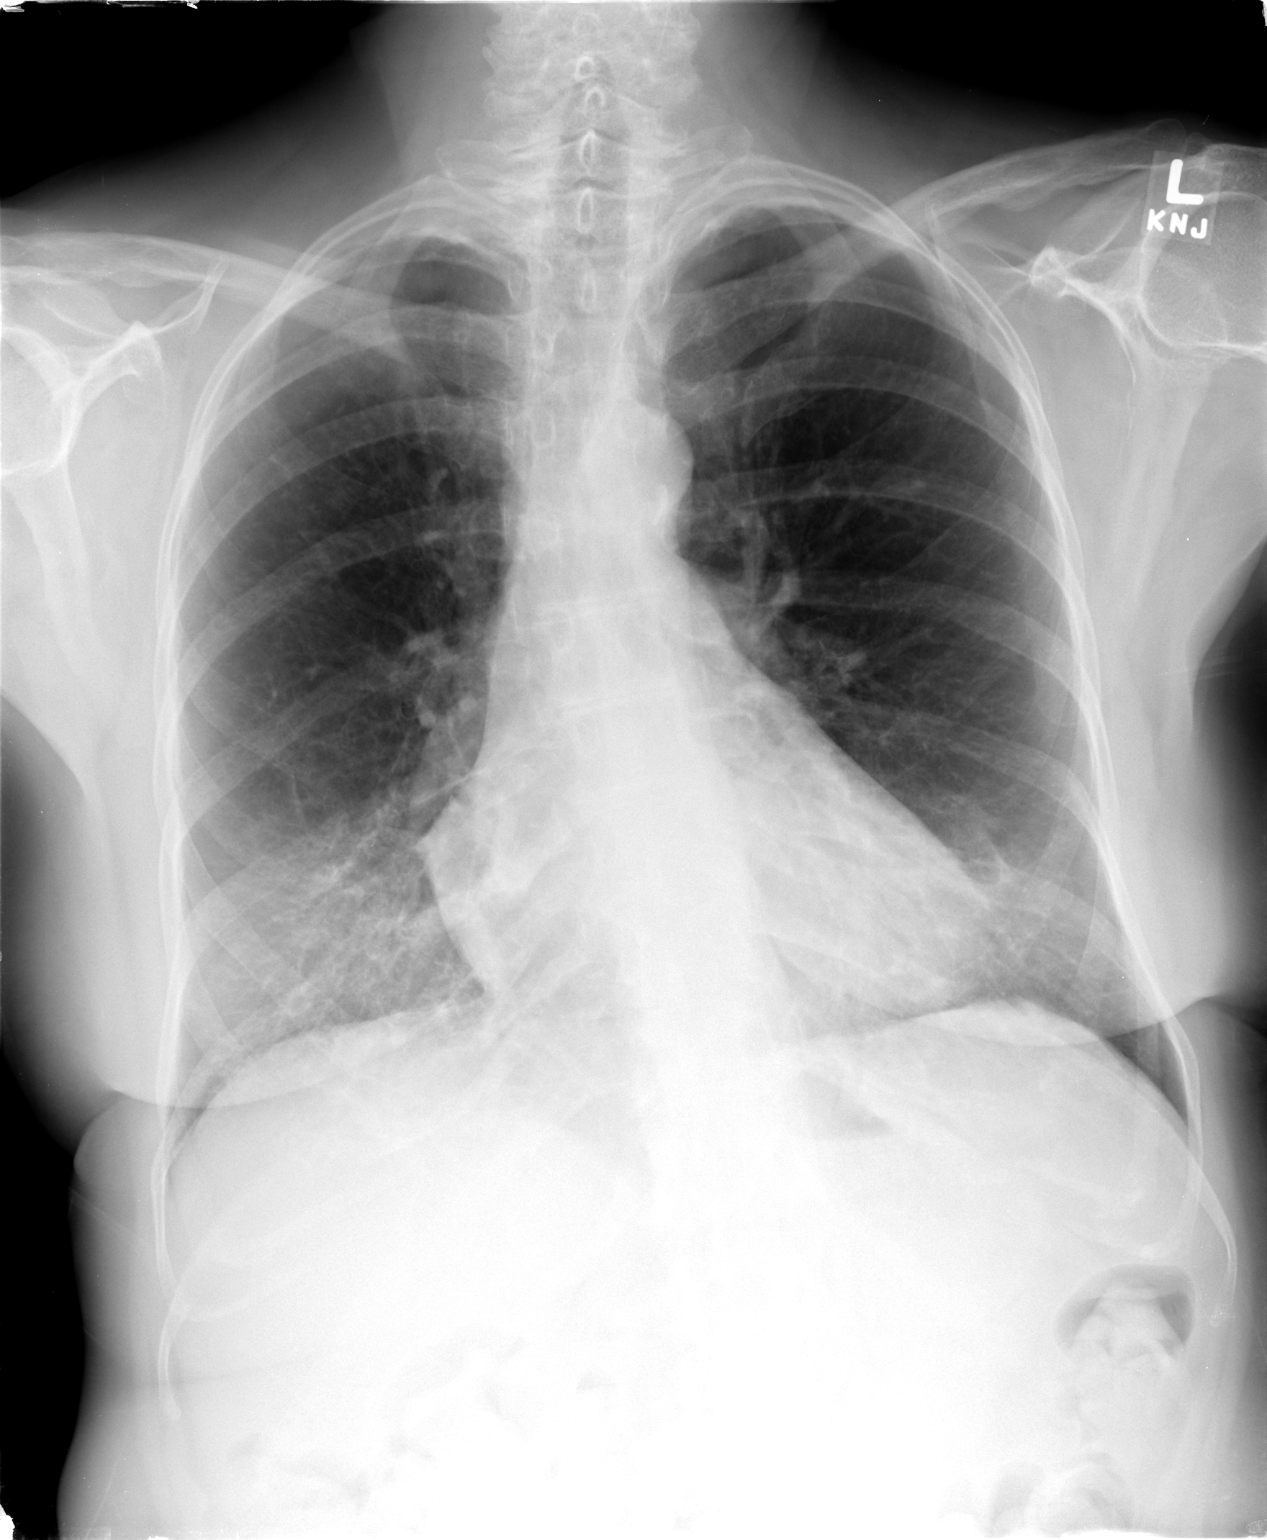

[view not recorded (2 of 2)]
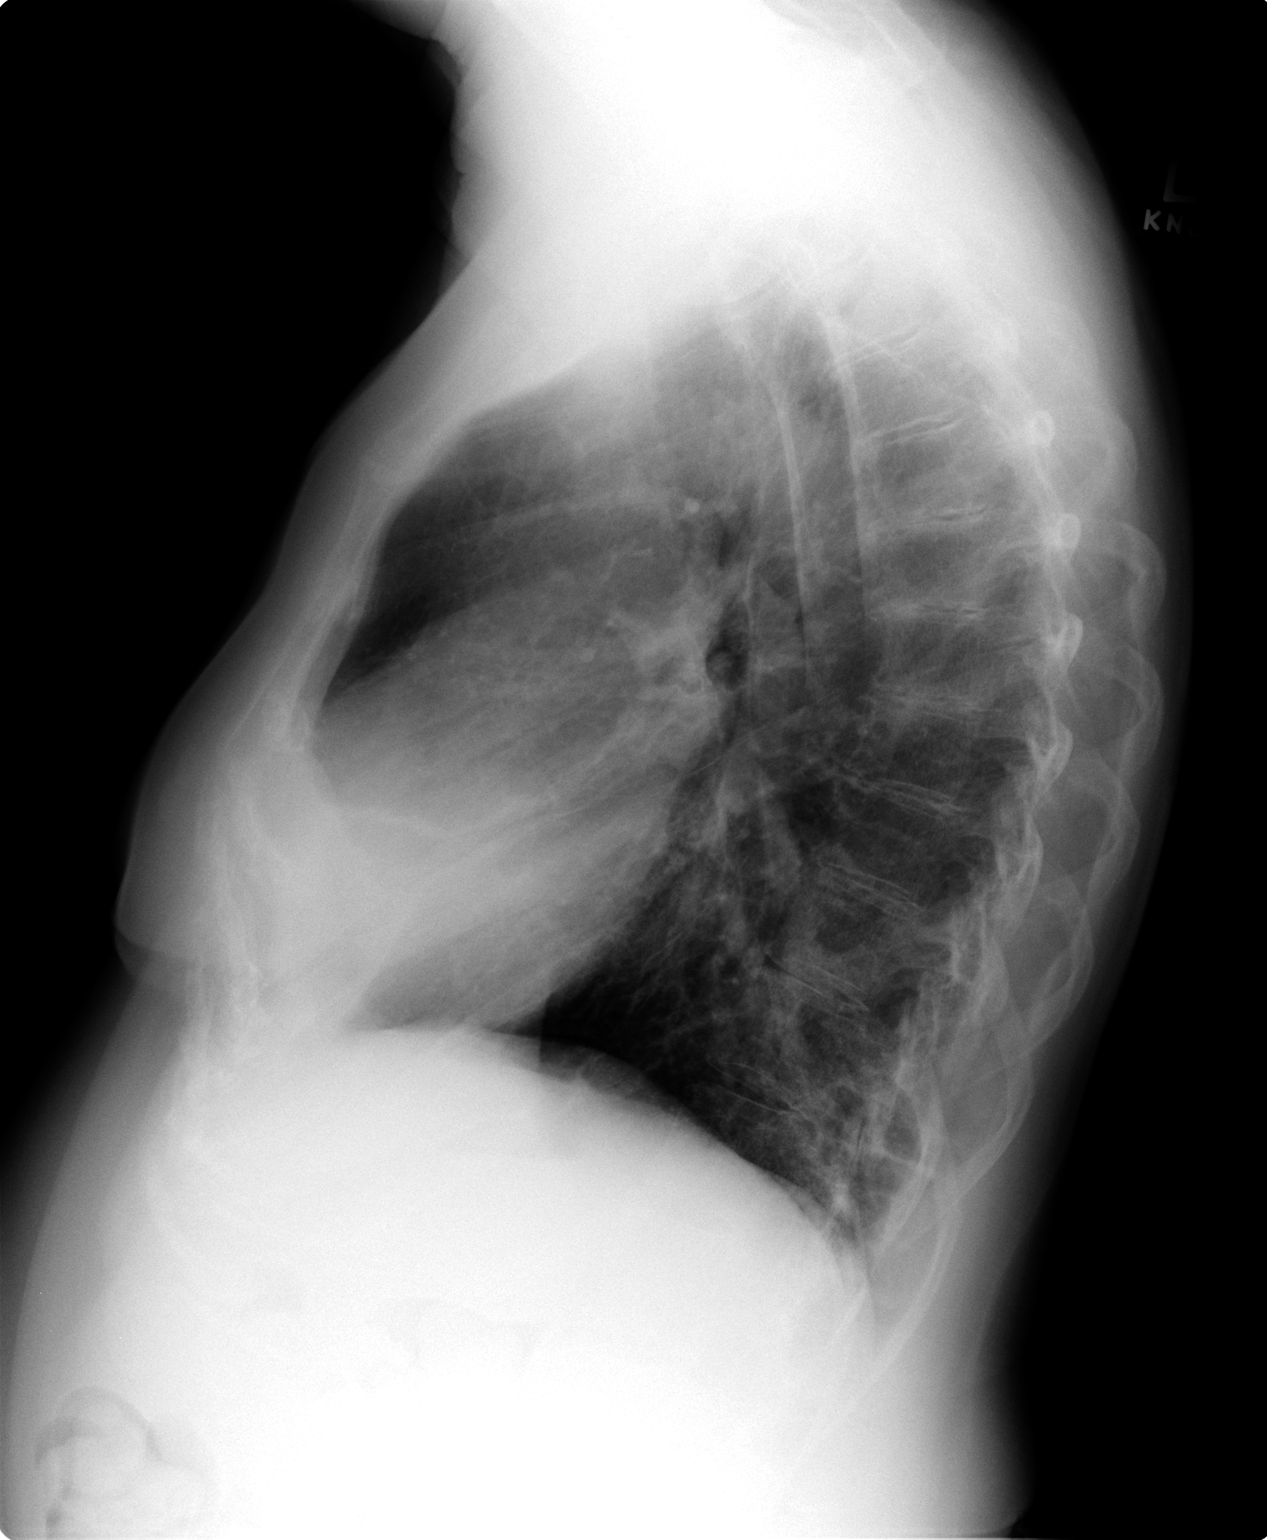

[2 of 2 positions shown; findings below may reference images not displayed]

FINDINGS: Enlargement of cardiac silhouette.

Mediastinal contours and pulmonary vascularity normal.

Resolved left basilar infiltrate.

Improved right basilar infiltrate.

Remaining lungs clear.

Underlying emphysematous changes.

No pleural effusion or pneumothorax.

Thoracolumbar scoliosis.
IMPRESSION: Resolved left basilar and improved right basilar infiltrates.

Underlying COPD changes.

## 2015-05-13 ENCOUNTER — Other Ambulatory Visit: Payer: Self-pay | Admitting: Internal Medicine

## 2015-05-22 ENCOUNTER — Telehealth: Payer: Self-pay | Admitting: Internal Medicine

## 2015-05-22 NOTE — Telephone Encounter (Signed)
Pt came by office stated that he ins is telling her with will need a PA on Her Paroxetine 40mg  on her next refill.  She will be out in March and she is fearful that she will run out before PA will be completed.    Sent paper work regarding this to [employer]

## 2015-05-24 DIAGNOSIS — M76822 Posterior tibial tendinitis, left leg: Secondary | ICD-10-CM | POA: Diagnosis not present

## 2015-05-24 DIAGNOSIS — M170 Bilateral primary osteoarthritis of knee: Secondary | ICD-10-CM | POA: Diagnosis not present

## 2015-05-24 DIAGNOSIS — M21612 Bunion of left foot: Secondary | ICD-10-CM | POA: Diagnosis not present

## 2015-05-24 DIAGNOSIS — M2042 Other hammer toe(s) (acquired), left foot: Secondary | ICD-10-CM | POA: Diagnosis not present

## 2015-05-24 NOTE — Telephone Encounter (Signed)
Paroxetine PA has been initiated via CoverMyMeds.

## 2015-05-27 ENCOUNTER — Telehealth: Payer: Self-pay | Admitting: *Deleted

## 2015-05-27 ENCOUNTER — Other Ambulatory Visit: Payer: Self-pay

## 2015-05-27 MED ORDER — TRAZODONE HCL 100 MG PO TABS: 100.0000 mg | ORAL_TABLET | Freq: Every day | ORAL | Status: DC

## 2015-05-27 NOTE — Telephone Encounter (Signed)
Left msg on triage stating insurance has change needing to get rx for Trazodone to [facility].../lmb Per chart refill already sent...Bianca Haley

## 2015-06-03 ENCOUNTER — Ambulatory Visit: Payer: Medicare Other | Admitting: Adult Health

## 2015-06-13 DIAGNOSIS — M76822 Posterior tibial tendinitis, left leg: Secondary | ICD-10-CM | POA: Diagnosis not present

## 2015-06-19 ENCOUNTER — Encounter: Payer: Self-pay | Admitting: Pulmonary Disease

## 2015-06-19 ENCOUNTER — Ambulatory Visit (INDEPENDENT_AMBULATORY_CARE_PROVIDER_SITE_OTHER)
Admission: RE | Admit: 2015-06-19 | Discharge: 2015-06-19 | Disposition: A | Discharge status: Home / Self Care | Payer: Medicare Other | Source: Ambulatory Visit | Attending: Pulmonary Disease | Admitting: Pulmonary Disease

## 2015-06-19 ENCOUNTER — Ambulatory Visit (INDEPENDENT_AMBULATORY_CARE_PROVIDER_SITE_OTHER): Payer: Medicare Other | Admitting: Pulmonary Disease

## 2015-06-19 VITALS — BP 150/90 | HR 58 | Ht 65.5 in | Wt 158.4 lb

## 2015-06-19 DIAGNOSIS — M25561 Pain in right knee: Secondary | ICD-10-CM | POA: Diagnosis not present

## 2015-06-19 DIAGNOSIS — K2100 Gastro-esophageal reflux disease with esophagitis, without bleeding: Secondary | ICD-10-CM

## 2015-06-19 DIAGNOSIS — M5416 Radiculopathy, lumbar region: Secondary | ICD-10-CM | POA: Diagnosis not present

## 2015-06-19 DIAGNOSIS — J189 Pneumonia, unspecified organism: Secondary | ICD-10-CM

## 2015-06-19 DIAGNOSIS — I10 Essential (primary) hypertension: Secondary | ICD-10-CM | POA: Diagnosis not present

## 2015-06-19 DIAGNOSIS — M5412 Radiculopathy, cervical region: Secondary | ICD-10-CM | POA: Diagnosis not present

## 2015-06-19 DIAGNOSIS — M76822 Posterior tibial tendinitis, left leg: Secondary | ICD-10-CM | POA: Diagnosis not present

## 2015-06-19 NOTE — Assessment & Plan Note (Signed)
Head of bed raised during sleep Continue Protonix

## 2015-06-19 NOTE — Assessment & Plan Note (Signed)
We discussed criteria for antibiotics Her recurrent pneumonias or related to reflux and aspiration  CXR is clear Call as needed Antibiotics only if - fever, green sputum

## 2015-06-19 NOTE — Patient Instructions (Signed)
CXR is clear Call as needed Antibiotics only if - fever, green sputum

## 2015-06-19 NOTE — Progress Notes (Signed)
   Subjective:    Patient ID: Bianca Haley, female    DOB: 20-Mar-1951, 65 y.o.   MRN: [id number]  HPI  65/F never smoker with recurrent pneumonia attributed to aspiration ? due to narcotics & esophageal dysmotility  -first seen for chronic cough x 76mnths on 02/21/13  Works parttime @ [employer]  06/19/2015  Chief Complaint  Patient presents with  . Follow-up    CXR today.  No wheezing, no cough, doing well.    LRI 03/2015 >> needed Moxifloxacin- zpak did not work  She takes opana daily She takes trazodone 100 mg& neurontin 900 mg at bedtime She also takes Valium as a muscle relaxant and Xanax as needed for anxiety Denies any dyspnea, wheezing, tightness, congestion. no new complaints.  No overt reflux - sleeps with HOB raised   CXR- clear  Significant tests/ events  02/2013 CT chest showed RML/RLL pneumonia and what appeared to be some progression of bronchiectasis (first noted 2011)   Esophagram was negative. IgG,IgaA and IgM levels were low, but not immunosuppressed.   09/2013 admitted for recurrent PNA /aspiration pneumonitis . CXR showed R>L consolidation c/w multifocal PNA.  MBS this admit without dysphagia but raised concerns of possible primary esophageal dysmotility as barium pill stopped mid esophagus requiring additional thin barium to facilitate transit to stomach , PPI to BID prior to discharge.     04/2014 EGD showed mild esophagitis.   The patient was visiting family out of state in [place], [place], where she was admitted for severe pneumonia, required vent support.  CT chest showed no pulmonary embolism. + dense bilateral, lower lobe consolidations, especially along the right middle lobe and left upper lobe. She had mediastinal lymphadenopathy. A 2-D echo showed a normal left ventricular systolic function and ejection fracture 67%. Pulmonary artery pressures were elevated at 43.   08/2014 admitted for pneumonia CT chest 12/2014 resolution of  infiltrates and inflammation. There are some persistent scattered sub 4 mm pulmonary nodules and areas of bronchiectasis. Persistent but slightly decreased mediastinal lymphadenopathy.  Review of Systems neg for any significant sore throat, dysphagia, itching, sneezing, nasal congestion or excess/ purulent secretions, fever, chills, sweats, unintended wt loss, pleuritic or exertional cp, hempoptysis, orthopnea pnd or change in chronic leg swelling. Also denies presyncope, palpitations, heartburn, abdominal pain, nausea, vomiting, diarrhea or change in bowel or urinary habits, dysuria,hematuria, rash, arthralgias, visual complaints, headache, numbness weakness or ataxia.     Objective:   Physical Exam  Gen. Pleasant, well-nourished, in no distress ENT - no lesions, no post nasal drip Neck: No JVD, no thyromegaly, no carotid bruits Lungs: no use of accessory muscles, no dullness to percussion, clear without rales or rhonchi  Cardiovascular: Rhythm regular, heart sounds  normal, no murmurs or gallops, no peripheral edema Musculoskeletal: No deformities, no cyanosis or clubbing        Assessment & Plan:

## 2015-06-27 DIAGNOSIS — M76822 Posterior tibial tendinitis, left leg: Secondary | ICD-10-CM | POA: Diagnosis not present

## 2015-06-27 NOTE — H&P (Signed)
TOTAL KNEE ADMISSION H&P  Patient is being admitted for left total knee arthroplasty.  Subjective:  Chief Complaint:     Left knee primary OA / pain  HPI: Bianca Haley, 65 y.o. female, has a history of pain and functional disability in the left knee due to arthritis and has failed non-surgical conservative treatments for greater than 12 weeks to includeNSAID's and/or analgesics, corticosteriod injections and activity modification.  Onset of symptoms was gradual, starting 1+ years ago with gradually worsening course since that time. The patient noted no past surgery on the left knee(s).  Patient currently rates pain in the left knee(s) at 7 out of 10 with activity. Patient has night pain, worsening of pain with activity and weight bearing, pain that interferes with activities of daily living, pain with passive range of motion, crepitus and joint swelling.  Patient has evidence of periarticular osteophytes and joint space narrowing by imaging studies.   There is no active infection.   Risks, benefits and expectations were discussed with the patient.  Risks including but not limited to the risk of anesthesia, blood clots, nerve damage, blood vessel damage, failure of the prosthesis, infection and up to and including death.  Patient understand the risks, benefits and expectations and wishes to proceed with surgery.   PCP: [name], MD  D/C Plans:      Home  Post-op Meds:       No Rx given   Tranexamic Acid:      To be given - IV    Decadron:      Is to be given  FYI:     ASA  Opana and add oxycodone  Dilaudid ok per patient    Patient Active Problem List   Diagnosis Date Noted  . Esophageal dysphagia 09/18/2014  . GERD (gastroesophageal reflux disease) 09/04/2014  . Chronic narcotic dependence ([place]) 09/04/2014  . Gross hematuria 08/31/2014  . Hemorrhagic cystitis 08/24/2014  . Bilateral lower extremity edema 04/23/2014  . Osteoarthritis of left knee 10/25/2013  . Knee pain,  left 10/13/2013  . Left leg swelling 10/13/2013  . Chronic neck and back pain 09/28/2013  . Hypokalemia 09/28/2013  . Abnormal CT scan, head 09/27/2013  . Diastolic dysfunction- grade I 02/24/2013  . Recurrent pneumonia 02/21/2013  . Chronic cough 01/04/2013  . Contracture of palmar fascia (Dupuytren's) 08/04/2011  . VENOUS INSUFFICIENCY, LEFT LEG 05/14/2010  . CRF (chronic renal failure) 12/02/2009  . INGUINAL PAIN, RIGHT 12/02/2009  . UNSPECIFIED ANEMIA 11/28/2009  . PULMONARY NODULE 05/10/2008  . FIBROIDS, UTERUS 08/03/2007  . Anxiety state 08/03/2007  . MIGRAINE HEADACHE 08/03/2007  . TINNITUS, LEFT 08/03/2007  . Essential hypertension 08/03/2007  . ENDOMETRIOSIS 08/03/2007  . ALCOHOL ABUSE, HX OF 08/03/2007  . MEASLES, HX OF 08/03/2007  . Personal history of urinary disorder 08/03/2007   Past Medical History  Diagnosis Date  . Hypertension   . Back pain   . Headache(784.0)   . Migraine   . Anemia   . Anxiety   . History of UTI   . History of endometriosis   . History of uterine fibroid   . H/O tinnitus     left  . GERD (gastroesophageal reflux disease)   . Chronic renal failure   . Recurrent pneumonia     Past Surgical History  Procedure Laterality Date  . Abdominal hysterectomy    . Bladder suspension    . Back surgery    . Colonoscopy  02/16/2008    normal  .  Laparoscopy      for evauation of endometriosis    No prescriptions prior to admission   Allergies  Allergen Reactions  . Penicillins Shortness Of Breath and Rash  . Morphine And Related Swelling    Severe pain Morphine only; other opioids OK    Social History  Substance Use Topics  . Smoking status: Never Smoker   . Smokeless tobacco: Never Used  . Alcohol Use: No     Comment: no drinking for sevral months    Family History  Problem Relation Age of Onset  . Heart disease Mother   . Emphysema Mother      Review of Systems  Constitutional: Negative.   Eyes: Negative.    Respiratory: Negative.   Cardiovascular: Negative.   Gastrointestinal: Positive for heartburn.  Genitourinary: Negative.   Musculoskeletal: Positive for joint pain.  Skin: Negative.   Neurological: Positive for headaches.  Endo/Heme/Allergies: Negative.   Psychiatric/Behavioral: The patient is nervous/anxious.     Objective:  Physical Exam  Constitutional: She is oriented to person, place, and time. She appears well-developed.  HENT:  Head: Normocephalic.  Eyes: Pupils are equal, round, and reactive to light.  Neck: Neck supple. No JVD present. No tracheal deviation present. No thyromegaly present.  Cardiovascular: Normal rate, regular rhythm, normal heart sounds and intact distal pulses.   Respiratory: Effort normal and breath sounds normal. No stridor. No respiratory distress. She has no wheezes.  GI: Soft. There is no tenderness. There is no guarding.  Musculoskeletal:       Left knee: She exhibits decreased range of motion, swelling and bony tenderness. She exhibits no ecchymosis, no deformity, no laceration and no erythema. Tenderness found.  Lymphadenopathy:    She has no cervical adenopathy.  Neurological: She is alert and oriented to person, place, and time.  Skin: Skin is warm and dry.  Psychiatric: She has a normal mood and affect.     Labs:  Estimated body mass index is 23.29 kg/(m^2) as calculated from the following:   Height as of 03/07/15: 5' 5.5" (1.664 m).   Weight as of 03/07/15: 64.501 kg (142 lb 3.2 oz).   Imaging Review Plain radiographs demonstrate severe degenerative joint disease of the left knee(s).  The bone quality appears to be good for age and reported activity level.  Assessment/Plan:  End stage arthritis, left knee   The patient history, physical examination, clinical judgment of the provider and imaging studies are consistent with end stage degenerative joint disease of the left knee(s) and total knee arthroplasty is deemed medically  necessary. The treatment options including medical management, injection therapy arthroscopy and arthroplasty were discussed at length. The risks and benefits of total knee arthroplasty were presented and reviewed. The risks due to aseptic loosening, infection, stiffness, patella tracking problems, thromboembolic complications and other imponderables were discussed. The patient acknowledged the explanation, agreed to proceed with the plan and consent was signed. Patient is being admitted for inpatient treatment for surgery, pain control, PT, OT, prophylactic antibiotics, VTE prophylaxis, progressive ambulation and ADL's and discharge planning. The patient is planning to be discharged home with home health services.      [place] Pugh Thang Flett   PA-C  06/27/2015, 9:29 AM

## 2015-07-03 ENCOUNTER — Telehealth: Payer: Self-pay | Admitting: Pulmonary Disease

## 2015-07-03 DIAGNOSIS — M76822 Posterior tibial tendinitis, left leg: Secondary | ICD-10-CM | POA: Diagnosis not present

## 2015-07-03 MED ORDER — LEVOFLOXACIN 500 MG PO TABS: 500.0000 mg | ORAL_TABLET | Freq: Every day | ORAL | Status: DC

## 2015-07-03 NOTE — Telephone Encounter (Signed)
Spoke with pt. States that she has a chest cold. Reports cough, wheezing and runny nose. Cough is non productive at this time. Denies chest tightness, SOB, body aches or fever. Symptoms started 10 days ago. Has been taking Alka Seltzer with minimal relief. Would like RA's recommendations.  RA - please advise. Thanks.

## 2015-07-03 NOTE — Patient Instructions (Addendum)
Bianca Haley  07/03/2015   Your procedure is scheduled on: 07/15/15   Report to [facility]  Entrance take [place]  elevators to 3rd floor to  [place] at     0700 AM.  Call this number if you have problems the morning of surgery [phone]   Remember: ONLY 1 PERSON MAY GO WITH YOU TO SHORT STAY TO GET  READY MORNING OF [place].  Do not eat food or drink liquids :After Midnight.     Take these medicines the morning of surgery with A SIP OF WATER:   Albuterol Inhaler if needed and bring, Atenolol ( Tenormin), diltiazem ( Cardiazem), Oxymorphone , Protonix, Paxil                                 You may not have any metal on your body including hair pins and              piercings  Do not wear jewelry, make-up, lotions, powders or perfumes, deodorant             Do not wear nail polish.  Do not shave  48 hours prior to surgery.             Do not bring valuables to the hospital. [place].  Contacts, dentures or bridgework may not be worn into surgery.  Leave suitcase in the car. After surgery it may be brought to your room.         Special Instructions: coughing and deep breathing exercises, leg exercises               Please read over the following fact sheets you were given: _____________________________________________________________________             [facility] - Preparing for Surgery Before surgery, you can play an important role.  Because skin is not sterile, your skin needs to be as free of germs as possible.  You can reduce the number of germs on your skin by washing with CHG (chlorahexidine gluconate) soap before surgery.  CHG is an antiseptic cleaner which kills germs and bonds with the skin to continue killing germs even after washing. Please DO NOT use if you have an allergy to CHG or antibacterial soaps.  If your skin becomes reddened/irritated stop using the CHG and inform  your nurse when you arrive at Short Stay. Do not shave (including legs and underarms) for at least 48 hours prior to the first CHG shower.  You may shave your face/neck. Please follow these instructions carefully:  1.  Shower with CHG Soap the night before surgery and the  morning of Surgery.  2.  If you choose to wash your hair, wash your hair first as usual with your  normal  shampoo.  3.  After you shampoo, rinse your hair and body thoroughly to remove the  shampoo.                           4.  Use CHG as you would any other liquid soap.  You can apply chg directly  to the skin and wash  Gently with a scrungie or clean washcloth.  5.  Apply the CHG Soap to your body ONLY FROM THE NECK DOWN.   Do not use on face/ open                           Wound or open sores. Avoid contact with eyes, ears mouth and genitals (private parts).                       Wash face,  Genitals (private parts) with your normal soap.             6.  Wash thoroughly, paying special attention to the area where your surgery  will be performed.  7.  Thoroughly rinse your body with warm water from the neck down.  8.  DO NOT shower/wash with your normal soap after using and rinsing off  the CHG Soap.                9.  Pat yourself dry with a clean towel.            10.  Wear clean pajamas.            11.  Place clean sheets on your bed the night of your first shower and do not  sleep with pets. Day of Surgery : Do not apply any lotions/deodorants the morning of surgery.  Please wear clean clothes to the hospital/surgery center.  FAILURE TO FOLLOW THESE INSTRUCTIONS MAY RESULT IN THE CANCELLATION OF YOUR SURGERY PATIENT SIGNATURE_________________________________  NURSE SIGNATURE__________________________________  ________________________________________________________________________  WHAT IS A BLOOD TRANSFUSION? Blood Transfusion Information  A transfusion is the replacement of blood or some  of its parts. Blood is made up of multiple cells which provide different functions.  Red blood cells carry oxygen and are used for blood loss replacement.  White blood cells fight against infection.  Platelets control bleeding.  Plasma helps clot blood.  Other blood products are available for specialized needs, such as hemophilia or other clotting disorders. BEFORE THE TRANSFUSION  Who gives blood for transfusions?   Healthy volunteers who are fully evaluated to make sure their blood is safe. This is blood bank blood. Transfusion therapy is the safest it has ever been in the practice of medicine. Before blood is taken from a donor, a complete history is taken to make sure that person has no history of diseases nor engages in risky social behavior (examples are intravenous drug use or sexual activity with multiple partners). The donor's travel history is screened to minimize risk of transmitting infections, such as malaria. The donated blood is tested for signs of infectious diseases, such as HIV and hepatitis. The blood is then tested to be sure it is compatible with you in order to minimize the chance of a transfusion reaction. If you or a relative donates blood, this is often done in anticipation of surgery and is not appropriate for emergency situations. It takes many days to process the donated blood. RISKS AND COMPLICATIONS Although transfusion therapy is very safe and saves many lives, the main dangers of transfusion include:  1. Getting an infectious disease. 2. Developing a transfusion reaction. This is an allergic reaction to something in the blood you were given. Every precaution is taken to prevent this. The decision to have a blood transfusion has been considered carefully by your caregiver before blood is given. Blood is not given unless the benefits outweigh  the risks. AFTER THE TRANSFUSION  Right after receiving a blood transfusion, you will usually feel much better and more  energetic. This is especially true if your red blood cells have gotten low (anemic). The transfusion raises the level of the red blood cells which carry oxygen, and this usually causes an energy increase.  The nurse administering the transfusion will monitor you carefully for complications. HOME CARE INSTRUCTIONS  No special instructions are needed after a transfusion. You may find your energy is better. Speak with your caregiver about any limitations on activity for underlying diseases you may have. SEEK MEDICAL CARE IF:   Your condition is not improving after your transfusion.  You develop redness or irritation at the intravenous (IV) site. SEEK IMMEDIATE MEDICAL CARE IF:  Any of the following symptoms occur over the next 12 hours:  Shaking chills.  You have a temperature by mouth above 102 F (38.9 C), not controlled by medicine.  Chest, back, or muscle pain.  People around you feel you are not acting correctly or are confused.  Shortness of breath or difficulty breathing.  Dizziness and fainting.  You get a rash or develop hives.  You have a decrease in urine output.  Your urine turns a dark color or changes to pink, red, or brown. Any of the following symptoms occur over the next 10 days:  You have a temperature by mouth above 102 F (38.9 C), not controlled by medicine.  Shortness of breath.  Weakness after normal activity.  The white part of the eye turns yellow (jaundice).  You have a decrease in the amount of urine or are urinating less often.  Your urine turns a dark color or changes to pink, red, or brown. Document Released: 04/17/2000 Document Revised: 07/13/2011 Document Reviewed: 12/05/2007 ExitCare Patient Information 2014 [place].  _______________________________________________________________________  Incentive Spirometer  An incentive spirometer is a tool that can help keep your lungs clear and active. This tool measures how well you are  filling your lungs with each breath. Taking long deep breaths may help reverse or decrease the chance of developing breathing (pulmonary) problems (especially infection) following:  A long period of time when you are unable to move or be active. BEFORE THE PROCEDURE   If the spirometer includes an indicator to show your best effort, your nurse or respiratory therapist will set it to a desired goal.  If possible, sit up straight or lean slightly forward. Try not to slouch.  Hold the incentive spirometer in an upright position. INSTRUCTIONS FOR USE  3. Sit on the edge of your bed if possible, or sit up as far as you can in bed or on a chair. 4. Hold the incentive spirometer in an upright position. 5. Breathe out normally. 6. Place the mouthpiece in your mouth and seal your lips tightly around it. 7. Breathe in slowly and as deeply as possible, raising the piston or the ball toward the top of the column. 8. Hold your breath for 3-5 seconds or for as long as possible. Allow the piston or ball to fall to the bottom of the column. 9. Remove the mouthpiece from your mouth and breathe out normally. 10. Rest for a few seconds and repeat Steps 1 through 7 at least 10 times every 1-2 hours when you are awake. Take your time and take a few normal breaths between deep breaths. 11. The spirometer may include an indicator to show your best effort. Use the indicator as a goal to work  toward during each repetition. 12. After each set of 10 deep breaths, practice coughing to be sure your lungs are clear. If you have an incision (the cut made at the time of surgery), support your incision when coughing by placing a pillow or rolled up towels firmly against it. Once you are able to get out of bed, walk around indoors and cough well. You may stop using the incentive spirometer when instructed by your caregiver.  RISKS AND COMPLICATIONS  Take your time so you do not get dizzy or light-headed.  If you are in  pain, you may need to take or ask for pain medication before doing incentive spirometry. It is harder to take a deep breath if you are having pain. AFTER USE  Rest and breathe slowly and easily.  It can be helpful to keep track of a log of your progress. Your caregiver can provide you with a simple table to help with this. If you are using the spirometer at home, follow these instructions: [place] IF:   You are having difficultly using the spirometer.  You have trouble using the spirometer as often as instructed.  Your pain medication is not giving enough relief while using the spirometer.  You develop fever of 100.5 F (38.1 C) or higher. SEEK IMMEDIATE MEDICAL CARE IF:   You cough up bloody sputum that had not been present before.  You develop fever of 102 F (38.9 C) or greater.  You develop worsening pain at or near the incision site. MAKE SURE YOU:   Understand these instructions.  Will watch your condition.  Will get help right away if you are not doing well or get worse. Document Released: 08/31/2006 Document Revised: 07/13/2011 Document Reviewed: 11/01/2006 [facility] Patient Information 2014 [place], [place].   ________________________________________________________________________

## 2015-07-03 NOTE — Telephone Encounter (Signed)
Spoke with pt. She is aware of RA's recommendation. Pt would like for [place] to go ahead and send in the [place]. She is aware not to take this unless her sputum turns yellow or green. She agreed and verbalized understanding. Rx has been sent in. Nothing further was needed.

## 2015-07-03 NOTE — Telephone Encounter (Signed)
Try to avoid antibiotics We'll use Levaquin only if yellow or green sputum

## 2015-07-03 NOTE — Telephone Encounter (Signed)
500 daily x 7ds - only use if above

## 2015-07-03 NOTE — Telephone Encounter (Signed)
What dose of Levaquin? 500 or 750?

## 2015-07-04 ENCOUNTER — Encounter (HOSPITAL_COMMUNITY)
Admission: RE | Admit: 2015-07-04 | Discharge: 2015-07-04 | Disposition: A | Discharge status: Home / Self Care | Payer: Medicare Other | Source: Ambulatory Visit | Attending: Orthopedic Surgery | Admitting: Orthopedic Surgery

## 2015-07-04 ENCOUNTER — Encounter (HOSPITAL_COMMUNITY): Payer: Self-pay

## 2015-07-04 DIAGNOSIS — I10 Essential (primary) hypertension: Secondary | ICD-10-CM | POA: Diagnosis not present

## 2015-07-04 DIAGNOSIS — M1712 Unilateral primary osteoarthritis, left knee: Secondary | ICD-10-CM | POA: Insufficient documentation

## 2015-07-04 DIAGNOSIS — Z01812 Encounter for preprocedural laboratory examination: Secondary | ICD-10-CM | POA: Insufficient documentation

## 2015-07-04 DIAGNOSIS — R001 Bradycardia, unspecified: Secondary | ICD-10-CM | POA: Insufficient documentation

## 2015-07-04 DIAGNOSIS — Z01818 Encounter for other preprocedural examination: Secondary | ICD-10-CM | POA: Diagnosis not present

## 2015-07-04 DIAGNOSIS — R9431 Abnormal electrocardiogram [ECG] [EKG]: Secondary | ICD-10-CM | POA: Diagnosis not present

## 2015-07-04 DIAGNOSIS — Z0183 Encounter for blood typing: Secondary | ICD-10-CM | POA: Diagnosis not present

## 2015-07-04 HISTORY — DX: Unspecified osteoarthritis, unspecified site: M19.90

## 2015-07-04 LAB — BASIC METABOLIC PANEL
ANION GAP: 8 (ref 5–15)
BUN: 16 mg/dL (ref 6–20)
CALCIUM: 9.3 mg/dL (ref 8.9–10.3)
CO2: 32 mmol/L (ref 22–32)
Chloride: 99 mmol/L — ABNORMAL LOW (ref 101–111)
Creatinine, Ser: 0.86 mg/dL (ref 0.44–1.00)
GFR calc Af Amer: >60 mL/min (ref >60)
GLUCOSE: 106 mg/dL — AB (ref 65–99)
POTASSIUM: 4.6 mmol/L (ref 3.5–5.1)
Sodium: 139 mmol/L (ref 135–145)

## 2015-07-04 LAB — CBC
HEMATOCRIT: 39 % (ref 36.0–46.0)
HEMOGLOBIN: 12.3 g/dL (ref 12.0–15.0)
MCH: 26.6 pg (ref 26.0–34.0)
MCHC: 31.5 g/dL (ref 30.0–36.0)
MCV: 84.4 fL (ref 78.0–100.0)
Platelets: 255 10*3/uL (ref 150–400)
RBC: 4.62 MIL/uL (ref 3.87–5.11)
RDW: 13.9 % (ref 11.5–15.5)
WBC: 7 10*3/uL (ref 4.0–10.5)

## 2015-07-04 LAB — URINALYSIS, ROUTINE W REFLEX MICROSCOPIC
BILIRUBIN URINE: NEGATIVE
GLUCOSE, UA: NEGATIVE mg/dL
Hgb urine dipstick: NEGATIVE
KETONES UR: NEGATIVE mg/dL
Leukocytes, UA: NEGATIVE
Nitrite: NEGATIVE
PH: 6 (ref 5.0–8.0)
PROTEIN: NEGATIVE mg/dL
Specific Gravity, Urine: 1.017 (ref 1.005–1.030)

## 2015-07-04 LAB — SURGICAL PCR SCREEN
MRSA, PCR: NEGATIVE
STAPHYLOCOCCUS AUREUS: POSITIVE — AB

## 2015-07-04 LAB — TYPE AND SCREEN
ABO/RH(D): A POS
ANTIBODY SCREEN: NEGATIVE

## 2015-07-04 LAB — ABO/RH: ABO/RH(D): A POS

## 2015-07-04 LAB — PROTIME-INR
INR: 0.98 (ref 0.00–1.49)
PROTHROMBIN TIME: 13.2 s (ref 11.6–15.2)

## 2015-07-04 LAB — APTT: APTT: 32 s (ref 24–37)

## 2015-07-04 NOTE — Progress Notes (Signed)
06/19/15- LOV- pulmonary- EPIC  06/19/15- EKG- EPIC

## 2015-07-04 NOTE — Progress Notes (Signed)
Final EKG done 07/04/15- EPIC

## 2015-07-15 ENCOUNTER — Encounter (HOSPITAL_COMMUNITY): Payer: Self-pay | Admitting: *Deleted

## 2015-07-15 ENCOUNTER — Inpatient Hospital Stay (HOSPITAL_COMMUNITY): Payer: Medicare Other | Admitting: Certified Registered Nurse Anesthetist

## 2015-07-15 ENCOUNTER — Encounter (HOSPITAL_COMMUNITY)
Admission: RE | Disposition: A | Discharge status: Home Health | Payer: Self-pay | Source: Ambulatory Visit | Attending: Orthopedic Surgery

## 2015-07-15 ENCOUNTER — Inpatient Hospital Stay (HOSPITAL_COMMUNITY)
Admission: RE | Admit: 2015-07-15 | Discharge: 2015-07-18 | DRG: 470 | Disposition: A | Discharge status: Home Health | Payer: Medicare Other | Source: Ambulatory Visit | Attending: Orthopedic Surgery | Admitting: Orthopedic Surgery

## 2015-07-15 DIAGNOSIS — M659 Synovitis and tenosynovitis, unspecified: Secondary | ICD-10-CM | POA: Diagnosis present

## 2015-07-15 DIAGNOSIS — K219 Gastro-esophageal reflux disease without esophagitis: Secondary | ICD-10-CM | POA: Diagnosis present

## 2015-07-15 DIAGNOSIS — Z01812 Encounter for preprocedural laboratory examination: Secondary | ICD-10-CM

## 2015-07-15 DIAGNOSIS — M179 Osteoarthritis of knee, unspecified: Secondary | ICD-10-CM | POA: Diagnosis not present

## 2015-07-15 DIAGNOSIS — R0902 Hypoxemia: Secondary | ICD-10-CM | POA: Diagnosis not present

## 2015-07-15 DIAGNOSIS — G4734 Idiopathic sleep related nonobstructive alveolar hypoventilation: Secondary | ICD-10-CM

## 2015-07-15 DIAGNOSIS — M25562 Pain in left knee: Secondary | ICD-10-CM | POA: Diagnosis not present

## 2015-07-15 DIAGNOSIS — F411 Generalized anxiety disorder: Secondary | ICD-10-CM | POA: Diagnosis present

## 2015-07-15 DIAGNOSIS — Z96652 Presence of left artificial knee joint: Secondary | ICD-10-CM

## 2015-07-15 DIAGNOSIS — I739 Peripheral vascular disease, unspecified: Secondary | ICD-10-CM | POA: Diagnosis present

## 2015-07-15 DIAGNOSIS — K5900 Constipation, unspecified: Secondary | ICD-10-CM | POA: Diagnosis not present

## 2015-07-15 DIAGNOSIS — M1712 Unilateral primary osteoarthritis, left knee: Principal | ICD-10-CM | POA: Diagnosis present

## 2015-07-15 DIAGNOSIS — Z96659 Presence of unspecified artificial knee joint: Secondary | ICD-10-CM

## 2015-07-15 DIAGNOSIS — R05 Cough: Secondary | ICD-10-CM | POA: Diagnosis not present

## 2015-07-15 HISTORY — PX: TOTAL KNEE ARTHROPLASTY: SHX125

## 2015-07-15 SURGERY — ARTHROPLASTY, KNEE, TOTAL
Anesthesia: Spinal | Site: Knee | Laterality: Left

## 2015-07-15 MED ORDER — STERILE WATER FOR IRRIGATION IR SOLN
Status: DC | PRN
  Administered 2015-07-15: 2000 mL

## 2015-07-15 MED ORDER — SIMVASTATIN 20 MG PO TABS: 20.0000 mg | ORAL_TABLET | Freq: Every day | ORAL | Status: DC

## 2015-07-15 MED ORDER — ONDANSETRON HCL 4 MG/2ML IJ SOLN
INTRAMUSCULAR | Status: DC | PRN
  Administered 2015-07-15: 4 mg via INTRAVENOUS

## 2015-07-15 MED ORDER — MIDAZOLAM HCL 2 MG/2ML IJ SOLN
INTRAMUSCULAR | Status: AC
  Filled 2015-07-15: qty 2

## 2015-07-15 MED ORDER — PROPOFOL 500 MG/50ML IV EMUL
INTRAVENOUS | Status: DC | PRN
  Administered 2015-07-15: 25 ug/kg/min via INTRAVENOUS

## 2015-07-15 MED ORDER — TRANEXAMIC ACID 1000 MG/10ML IV SOLN
1000.0000 mg | Freq: Once | INTRAVENOUS | Status: AC
  Administered 2015-07-15: 1000 mg via INTRAVENOUS
  Filled 2015-07-15: qty 10

## 2015-07-15 MED ORDER — PROPOFOL 10 MG/ML IV BOLUS
INTRAVENOUS | Status: AC
  Filled 2015-07-15: qty 40

## 2015-07-15 MED ORDER — MENTHOL 3 MG MT LOZG
1.0000 | LOZENGE | OROMUCOSAL | Status: DC | PRN
  Administered 2015-07-16: 3 mg via ORAL
  Filled 2015-07-15: qty 9

## 2015-07-15 MED ORDER — SODIUM CHLORIDE 0.9 % IJ SOLN
INTRAMUSCULAR | Status: DC | PRN
  Administered 2015-07-15: 30 mL

## 2015-07-15 MED ORDER — FENTANYL CITRATE (PF) 100 MCG/2ML IJ SOLN
INTRAMUSCULAR | Status: AC
  Filled 2015-07-15: qty 2

## 2015-07-15 MED ORDER — MEPERIDINE HCL 50 MG/ML IJ SOLN
6.2500 mg | INTRAMUSCULAR | Status: DC | PRN
  Administered 2015-07-15: 6.25 mg via INTRAVENOUS

## 2015-07-15 MED ORDER — GABAPENTIN 300 MG PO CAPS
900.0000 mg | ORAL_CAPSULE | Freq: Every day | ORAL | Status: DC
  Administered 2015-07-15 – 2015-07-17 (×3): 900 mg via ORAL
  Filled 2015-07-15 (×4): qty 3

## 2015-07-15 MED ORDER — GABAPENTIN 300 MG PO CAPS
300.0000 mg | ORAL_CAPSULE | Freq: Every day | ORAL | Status: DC
  Administered 2015-07-15 – 2015-07-18 (×4): 300 mg via ORAL
  Filled 2015-07-15 (×4): qty 1

## 2015-07-15 MED ORDER — MIDAZOLAM HCL 5 MG/5ML IJ SOLN
INTRAMUSCULAR | Status: DC | PRN
  Administered 2015-07-15: 2 mg via INTRAVENOUS

## 2015-07-15 MED ORDER — ONDANSETRON HCL 4 MG PO TABS: 4.0000 mg | ORAL_TABLET | Freq: Four times a day (QID) | ORAL | Status: DC | PRN

## 2015-07-15 MED ORDER — METHOCARBAMOL 1000 MG/10ML IJ SOLN
500.0000 mg | Freq: Four times a day (QID) | INTRAVENOUS | Status: DC | PRN
  Administered 2015-07-15: 500 mg via INTRAVENOUS
  Filled 2015-07-15 (×2): qty 5

## 2015-07-15 MED ORDER — ROSUVASTATIN CALCIUM 5 MG PO TABS
5.0000 mg | ORAL_TABLET | Freq: Every day | ORAL | Status: DC
  Administered 2015-07-15 – 2015-07-17 (×3): 5 mg via ORAL
  Filled 2015-07-15 (×4): qty 1

## 2015-07-15 MED ORDER — BUPIVACAINE-EPINEPHRINE (PF) 0.25% -1:200000 IJ SOLN
INTRAMUSCULAR | Status: AC
Start: 2015-07-15 — End: 2015-07-15
  Filled 2015-07-15: qty 30

## 2015-07-15 MED ORDER — DEXAMETHASONE SODIUM PHOSPHATE 10 MG/ML IJ SOLN
INTRAMUSCULAR | Status: AC
  Filled 2015-07-15: qty 1

## 2015-07-15 MED ORDER — DIAZEPAM 5 MG PO TABS
5.0000 mg | ORAL_TABLET | Freq: Two times a day (BID) | ORAL | Status: DC | PRN
  Filled 2015-07-15: qty 1

## 2015-07-15 MED ORDER — VANCOMYCIN HCL IN DEXTROSE 1-5 GM/200ML-% IV SOLN
1000.0000 mg | Freq: Two times a day (BID) | INTRAVENOUS | Status: AC
  Administered 2015-07-15: 1000 mg via INTRAVENOUS
  Filled 2015-07-15: qty 200

## 2015-07-15 MED ORDER — OXYMORPHONE HCL 5 MG PO TABS
10.0000 mg | ORAL_TABLET | ORAL | Status: DC
  Administered 2015-07-15 – 2015-07-18 (×7): 10 mg via ORAL
  Filled 2015-07-15 (×14): qty 2

## 2015-07-15 MED ORDER — PHENOL 1.4 % MT LIQD
1.0000 | OROMUCOSAL | Status: DC | PRN
  Filled 2015-07-15: qty 177

## 2015-07-15 MED ORDER — PAROXETINE HCL 20 MG PO TABS
40.0000 mg | ORAL_TABLET | Freq: Two times a day (BID) | ORAL | Status: DC
  Administered 2015-07-15 – 2015-07-18 (×6): 40 mg via ORAL
  Filled 2015-07-15 (×9): qty 2

## 2015-07-15 MED ORDER — BUPIVACAINE IN DEXTROSE 0.75-8.25 % IT SOLN
INTRATHECAL | Status: DC | PRN
  Administered 2015-07-15: 2 mL via INTRATHECAL

## 2015-07-15 MED ORDER — FERROUS SULFATE 325 (65 FE) MG PO TABS
325.0000 mg | ORAL_TABLET | Freq: Three times a day (TID) | ORAL | Status: DC
Start: 2015-07-15 — End: 2015-07-18
  Administered 2015-07-15 – 2015-07-18 (×7): 325 mg via ORAL
  Filled 2015-07-15 (×11): qty 1

## 2015-07-15 MED ORDER — CHLORHEXIDINE GLUCONATE 4 % EX LIQD: 60.0000 mL | Freq: Once | CUTANEOUS | Status: DC

## 2015-07-15 MED ORDER — ONDANSETRON HCL 4 MG/2ML IJ SOLN: 4.0000 mg | Freq: Once | INTRAMUSCULAR | Status: DC | PRN

## 2015-07-15 MED ORDER — POLYETHYLENE GLYCOL 3350 17 G PO PACK
17.0000 g | PACK | Freq: Two times a day (BID) | ORAL | Status: DC
  Administered 2015-07-16 – 2015-07-18 (×5): 17 g via ORAL

## 2015-07-15 MED ORDER — CELECOXIB 200 MG PO CAPS
200.0000 mg | ORAL_CAPSULE | Freq: Two times a day (BID) | ORAL | Status: DC
  Administered 2015-07-15 – 2015-07-18 (×6): 200 mg via ORAL
  Filled 2015-07-15 (×8): qty 1

## 2015-07-15 MED ORDER — ALUM & MAG HYDROXIDE-SIMETH 200-200-20 MG/5ML PO SUSP: 30.0000 mL | ORAL | Status: DC | PRN

## 2015-07-15 MED ORDER — ONDANSETRON HCL 4 MG/2ML IJ SOLN: 4.0000 mg | Freq: Four times a day (QID) | INTRAMUSCULAR | Status: DC | PRN

## 2015-07-15 MED ORDER — ATENOLOL 100 MG PO TABS
100.0000 mg | ORAL_TABLET | Freq: Every day | ORAL | Status: DC
  Administered 2015-07-17 – 2015-07-18 (×2): 100 mg via ORAL
  Filled 2015-07-15 (×3): qty 1

## 2015-07-15 MED ORDER — VANCOMYCIN HCL IN DEXTROSE 1-5 GM/200ML-% IV SOLN
1000.0000 mg | INTRAVENOUS | Status: AC
  Administered 2015-07-15: 1000 mg via INTRAVENOUS
  Filled 2015-07-15 (×2): qty 200

## 2015-07-15 MED ORDER — OXYCODONE HCL 5 MG PO TABS
5.0000 mg | ORAL_TABLET | ORAL | Status: DC
  Administered 2015-07-15: 10 mg via ORAL
  Administered 2015-07-15: 15 mg via ORAL
  Administered 2015-07-15: 10 mg via ORAL
  Administered 2015-07-16 (×2): 15 mg via ORAL
  Filled 2015-07-15: qty 2
  Filled 2015-07-15 (×3): qty 3
  Filled 2015-07-15: qty 2

## 2015-07-15 MED ORDER — ASPIRIN EC 325 MG PO TBEC
325.0000 mg | DELAYED_RELEASE_TABLET | Freq: Two times a day (BID) | ORAL | Status: DC
  Administered 2015-07-16 – 2015-07-18 (×5): 325 mg via ORAL
  Filled 2015-07-15 (×8): qty 1

## 2015-07-15 MED ORDER — TRAZODONE HCL 100 MG PO TABS
100.0000 mg | ORAL_TABLET | Freq: Every day | ORAL | Status: DC
  Administered 2015-07-15 – 2015-07-17 (×2): 100 mg via ORAL
  Filled 2015-07-15 (×5): qty 1

## 2015-07-15 MED ORDER — HYDROMORPHONE HCL 1 MG/ML IJ SOLN
0.5000 mg | INTRAMUSCULAR | Status: DC | PRN
  Administered 2015-07-15 – 2015-07-16 (×3): 1 mg via INTRAVENOUS
  Filled 2015-07-15 (×3): qty 1

## 2015-07-15 MED ORDER — GLYCOPYRROLATE 0.2 MG/ML IJ SOLN
INTRAMUSCULAR | Status: DC | PRN
  Administered 2015-07-15: 0.2 mg via INTRAVENOUS

## 2015-07-15 MED ORDER — KETOROLAC TROMETHAMINE 30 MG/ML IJ SOLN
INTRAMUSCULAR | Status: DC | PRN
  Administered 2015-07-15: 30 mg

## 2015-07-15 MED ORDER — SODIUM CHLORIDE 0.9 % IR SOLN
Status: DC | PRN
  Administered 2015-07-15: 1000 mL

## 2015-07-15 MED ORDER — BUPIVACAINE-EPINEPHRINE (PF) 0.25% -1:200000 IJ SOLN
INTRAMUSCULAR | Status: DC | PRN
  Administered 2015-07-15: 30 mL

## 2015-07-15 MED ORDER — MEPERIDINE HCL 50 MG/ML IJ SOLN
INTRAMUSCULAR | Status: AC
  Filled 2015-07-15: qty 1

## 2015-07-15 MED ORDER — 0.9 % SODIUM CHLORIDE (POUR BTL) OPTIME
TOPICAL | Status: DC | PRN
  Administered 2015-07-15: 1000 mL

## 2015-07-15 MED ORDER — LACTATED RINGERS IV SOLN
INTRAVENOUS | Status: DC
  Administered 2015-07-15: 09:00:00 via INTRAVENOUS

## 2015-07-15 MED ORDER — SODIUM CHLORIDE 0.9 % IV SOLN
INTRAVENOUS | Status: DC
  Administered 2015-07-15: 21:00:00 via INTRAVENOUS
  Filled 2015-07-15 (×10): qty 1000

## 2015-07-15 MED ORDER — BISACODYL 10 MG RE SUPP: 10.0000 mg | Freq: Every day | RECTAL | Status: DC | PRN

## 2015-07-15 MED ORDER — FENTANYL CITRATE (PF) 100 MCG/2ML IJ SOLN: 25.0000 ug | INTRAMUSCULAR | Status: DC | PRN

## 2015-07-15 MED ORDER — DIPHENHYDRAMINE HCL 25 MG PO CAPS: 25.0000 mg | ORAL_CAPSULE | Freq: Four times a day (QID) | ORAL | Status: DC | PRN

## 2015-07-15 MED ORDER — CALCIUM CARBONATE 1250 (500 CA) MG PO TABS
1.0000 | ORAL_TABLET | Freq: Every day | ORAL | Status: DC
  Administered 2015-07-16 – 2015-07-18 (×3): 500 mg via ORAL
  Filled 2015-07-15 (×4): qty 1

## 2015-07-15 MED ORDER — DEXAMETHASONE SODIUM PHOSPHATE 10 MG/ML IJ SOLN
10.0000 mg | Freq: Once | INTRAMUSCULAR | Status: AC
  Administered 2015-07-16: 10 mg via INTRAVENOUS

## 2015-07-15 MED ORDER — ONDANSETRON HCL 4 MG/2ML IJ SOLN
INTRAMUSCULAR | Status: AC
  Filled 2015-07-15: qty 2

## 2015-07-15 MED ORDER — KETOROLAC TROMETHAMINE 30 MG/ML IJ SOLN
INTRAMUSCULAR | Status: AC
  Filled 2015-07-15: qty 1

## 2015-07-15 MED ORDER — SUMATRIPTAN SUCCINATE 100 MG PO TABS
100.0000 mg | ORAL_TABLET | Freq: Once | ORAL | Status: DC
  Filled 2015-07-15: qty 1

## 2015-07-15 MED ORDER — METHOCARBAMOL 500 MG PO TABS
500.0000 mg | ORAL_TABLET | Freq: Four times a day (QID) | ORAL | Status: DC | PRN
  Administered 2015-07-16 – 2015-07-18 (×3): 500 mg via ORAL
  Filled 2015-07-15 (×4): qty 1

## 2015-07-15 MED ORDER — METOCLOPRAMIDE HCL 5 MG/ML IJ SOLN: 5.0000 mg | Freq: Three times a day (TID) | INTRAMUSCULAR | Status: DC | PRN

## 2015-07-15 MED ORDER — DILTIAZEM HCL ER COATED BEADS 180 MG PO CP24
180.0000 mg | ORAL_CAPSULE | Freq: Every day | ORAL | Status: DC
  Administered 2015-07-17 – 2015-07-18 (×2): 180 mg via ORAL
  Filled 2015-07-15 (×3): qty 1

## 2015-07-15 MED ORDER — ALBUTEROL SULFATE (2.5 MG/3ML) 0.083% IN NEBU: 3.0000 mL | INHALATION_SOLUTION | Freq: Four times a day (QID) | RESPIRATORY_TRACT | Status: DC | PRN

## 2015-07-15 MED ORDER — SODIUM CHLORIDE 0.9 % IJ SOLN
INTRAMUSCULAR | Status: AC
  Filled 2015-07-15: qty 50

## 2015-07-15 MED ORDER — PANTOPRAZOLE SODIUM 40 MG PO TBEC
40.0000 mg | DELAYED_RELEASE_TABLET | Freq: Two times a day (BID) | ORAL | Status: DC
  Administered 2015-07-15 – 2015-07-18 (×7): 40 mg via ORAL
  Filled 2015-07-15 (×8): qty 1

## 2015-07-15 MED ORDER — DOCUSATE SODIUM 100 MG PO CAPS
100.0000 mg | ORAL_CAPSULE | Freq: Two times a day (BID) | ORAL | Status: DC
  Administered 2015-07-15 – 2015-07-18 (×6): 100 mg via ORAL

## 2015-07-15 MED ORDER — METOCLOPRAMIDE HCL 10 MG PO TABS: 5.0000 mg | ORAL_TABLET | Freq: Three times a day (TID) | ORAL | Status: DC | PRN

## 2015-07-15 MED ORDER — LIDOCAINE HCL (CARDIAC) 20 MG/ML IV SOLN
INTRAVENOUS | Status: AC
  Filled 2015-07-15: qty 5

## 2015-07-15 MED ORDER — MAGNESIUM CITRATE PO SOLN
1.0000 | Freq: Once | ORAL | Status: DC | PRN
Start: 2015-07-15 — End: 2015-07-18

## 2015-07-15 MED ORDER — DEXAMETHASONE SODIUM PHOSPHATE 10 MG/ML IJ SOLN
10.0000 mg | Freq: Once | INTRAMUSCULAR | Status: AC
  Administered 2015-07-15: 10 mg via INTRAVENOUS

## 2015-07-15 SURGICAL SUPPLY — 44 items
BANDAGE ACE 6X5 VEL STRL LF (GAUZE/BANDAGES/DRESSINGS) ×2 IMPLANT
BLADE SAW SGTL 13.0X1.19X90.0M (BLADE) ×2 IMPLANT
BOWL SMART MIX CTS (DISPOSABLE) ×2 IMPLANT
CAPT KNEE TOTAL 3 ATTUNE ×1 IMPLANT
CEMENT HV SMART SET (Cement) ×2 IMPLANT
CLOTH BEACON ORANGE TIMEOUT ST (SAFETY) ×2 IMPLANT
CUFF TOURN SGL QUICK 34 (TOURNIQUET CUFF) ×2
CUFF TRNQT CYL 34X4X40X1 (TOURNIQUET CUFF) ×1 IMPLANT
DECANTER SPIKE VIAL GLASS SM (MISCELLANEOUS) ×2 IMPLANT
DRAPE U-SHAPE 47X51 STRL (DRAPES) ×2 IMPLANT
DRSG AQUACEL AG ADV 3.5X10 (GAUZE/BANDAGES/DRESSINGS) ×2 IMPLANT
DURAPREP 26ML APPLICATOR (WOUND CARE) ×4 IMPLANT
ELECT REM PT RETURN 9FT ADLT (ELECTROSURGICAL) ×2
ELECTRODE REM PT RTRN 9FT ADLT (ELECTROSURGICAL) ×1 IMPLANT
GLOVE BIO SURGEON STRL SZ 6.5 (GLOVE) ×2 IMPLANT
GLOVE BIO SURGEON STRL SZ7.5 (GLOVE) ×3 IMPLANT
GLOVE BIOGEL PI IND STRL 6.5 (GLOVE) IMPLANT
GLOVE BIOGEL PI IND STRL 7.5 (GLOVE) ×1 IMPLANT
GLOVE BIOGEL PI IND STRL 8.5 (GLOVE) ×1 IMPLANT
GLOVE BIOGEL PI INDICATOR 6.5 (GLOVE) ×1
GLOVE BIOGEL PI INDICATOR 7.5 (GLOVE) ×4
GLOVE BIOGEL PI INDICATOR 8.5 (GLOVE) ×1
GLOVE ECLIPSE 8.0 STRL XLNG CF (GLOVE) ×2 IMPLANT
GLOVE ORTHO TXT STRL SZ7.5 (GLOVE) ×4 IMPLANT
GOWN STRL REUS W/TWL LRG LVL3 (GOWN DISPOSABLE) ×2 IMPLANT
GOWN STRL REUS W/TWL XL LVL3 (GOWN DISPOSABLE) ×4 IMPLANT
HANDPIECE INTERPULSE COAX TIP (DISPOSABLE) ×2
LIQUID BAND (GAUZE/BANDAGES/DRESSINGS) ×2 IMPLANT
MANIFOLD NEPTUNE II (INSTRUMENTS) ×2 IMPLANT
PACK TOTAL KNEE CUSTOM (KITS) ×2 IMPLANT
POSITIONER SURGICAL ARM (MISCELLANEOUS) ×2 IMPLANT
SET HNDPC FAN SPRY TIP SCT (DISPOSABLE) ×1 IMPLANT
SET PAD KNEE POSITIONER (MISCELLANEOUS) ×2 IMPLANT
SUCTION FRAZIER HANDLE 12FR (TUBING) ×1
SUCTION TUBE FRAZIER 12FR DISP (TUBING) ×1 IMPLANT
SUT MNCRL AB 4-0 PS2 18 (SUTURE) ×2 IMPLANT
SUT VIC AB 1 CT1 36 (SUTURE) ×2 IMPLANT
SUT VIC AB 2-0 CT1 27 (SUTURE) ×6
SUT VIC AB 2-0 CT1 TAPERPNT 27 (SUTURE) ×3 IMPLANT
SUT VLOC 180 0 24IN GS25 (SUTURE) ×2 IMPLANT
SYR 50ML LL SCALE MARK (SYRINGE) ×2 IMPLANT
TRAY FOLEY W/METER SILVER 14FR (SET/KITS/TRAYS/PACK) ×2 IMPLANT
WRAP KNEE MAXI GEL POST OP (GAUZE/BANDAGES/DRESSINGS) ×2 IMPLANT
YANKAUER SUCT BULB TIP 10FT TU (MISCELLANEOUS) ×2 IMPLANT

## 2015-07-15 NOTE — Progress Notes (Signed)
Pharmacy - consult regarding Opana  I interviewed the patient and updated her home med list. She takes Opana 10mg  every morning and 10mg  every afternoon. She also takes 10mg  at bedtime PRN pain but rarely needs it.  We have Opana in stock so I entered an order for Opana 10mg  bid scheduled only. For breakthrough pain she agrees to use the oxycodone or hydromorphone.    Pharmacy will dc follow-up at this time. Thanks.  Romeo Rabon, PharmD, pager [phone]. 07/15/2015,3:52 PM.

## 2015-07-15 NOTE — Anesthesia Procedure Notes (Addendum)
Spinal Patient location during procedure: OR Start time: 07/15/2015 10:30 AM End time: 07/15/2015 10:37 AM Staffing Anesthesiologist: Lauretta Grill Resident/CRNA: Christell Faith L Performed by: resident/CRNA and anesthesiologist  Preanesthetic Checklist Completed: patient identified, site marked, surgical consent, pre-op evaluation, timeout performed, IV checked, risks and benefits discussed and monitors and equipment checked Spinal Block Patient position: sitting Prep: Betadine Patient monitoring: heart rate, continuous pulse ox and blood pressure Approach: right paramedian Location: L3-4 Injection technique: single-shot Needle Needle type: Sprotte  Needle gauge: 25 G Needle length: 9 cm Assessment Sensory level: T4 Additional Notes Expiration of kit checked and confirmed. Patient tolerated procedure well,without complications. 2 attempts first by CRNA and os encountered then by Anesthesiologist and on first attempt sprotte easily passed and clear CSF. Loss of motor and sensory on exam post injection.

## 2015-07-15 NOTE — Op Note (Signed)
NAME:  Bianca Haley                      MEDICAL RECORD NO.:  [id number]                             FACILITY:  [facility]      PHYSICIAN:  Pietro Cassis. Alvan Dame, M.D.  DATE OF BIRTH:  Dec 16, 1950      DATE OF PROCEDURE:  07/15/2015                                     OPERATIVE REPORT         PREOPERATIVE DIAGNOSIS:  Left knee osteoarthritis.      POSTOPERATIVE DIAGNOSIS:  Left knee osteoarthritis.      FINDINGS:  The patient was noted to have complete loss of cartilage and   bone-on-bone arthritis with associated osteophytes in the patellofemoral and medial compartments of   the knee with a significant synovitis and associated effusion.      PROCEDURE:  Left total knee replacement.      COMPONENTS USED:  DePuy Attune rotating platform posterior stabilized knee   system, a size 5N femur, 4 tibia, size 6 mm PS AOX insert, and 35 anatomic patellar   button.      SURGEON:  Pietro Cassis. Alvan Dame, M.D.      ASSISTANT:  Danae Orleans, PA-C.      ANESTHESIA:  Spinal.      SPECIMENS:  None.      COMPLICATION:  None.      DRAINS:  None  EBL: <100cc      TOURNIQUET TIME:   Total Tourniquet Time Documented: Thigh (Left) - 31 minutes Total: Thigh (Left) - 31 minutes  .      The patient was stable to the recovery room.      INDICATION FOR PROCEDURE:  Bianca Haley is a 65 y.o. female patient of   mine.  The patient had been seen, evaluated, and treated conservatively in the   office with medication, activity modification, and injections.  The patient had   radiographic changes of bone-on-bone arthritis with endplate sclerosis and osteophytes noted.      The patient failed conservative measures including medication, injections, and activity modification, and at this point was ready for more definitive measures.   Based on the radiographic changes and failed conservative measures, the patient   decided to proceed with total knee replacement.  Risks of infection,   DVT, component failure,  need for revision surgery, postop course, and   expectations were all   discussed and reviewed.  Consent was obtained for benefit of pain   relief.      PROCEDURE IN DETAIL:  The patient was brought to the operative theater.   Once adequate anesthesia, preoperative antibiotics, 1 gm of Vancomycin, 1 gm of Tranexamic Acid, and 10 mg of Decadron administered, the patient was positioned supine with the left thigh tourniquet placed.  The  left lower extremity was prepped and draped in sterile fashion.  A time-   out was performed identifying the patient, planned procedure, and   extremity.      The left lower extremity was placed in the [facility] leg holder.  The leg was   exsanguinated, tourniquet elevated to 250 mmHg.  A midline incision was   made  followed by median parapatellar arthrotomy.  Following initial   exposure, attention was first directed to the patella.  Precut   measurement was noted to be 22 mm.  I resected down to 14 mm and used a   35 anatomic patellar button to restore patellar height as well as cover the cut   surface.      The lug holes were drilled and a metal shim was placed to protect the   patella from retractors and saw blades.      At this point, attention was now directed to the femur.  The femoral   canal was opened with a drill, irrigated to try to prevent fat emboli.  An   intramedullary rod was passed at 3 degrees valgus, 9 mm initially (but after a trial a recut 2 more mm) of bone was   resected off the distal femur.  Following this resection, the tibia was   subluxated anteriorly.  Using the extramedullary guide, 3 mm of bone was resected off   the proximal medial tibia.  We confirmed the gap would be   stable medially and laterally with a size 5 mm insert as well as confirmed   the cut was perpendicular in the coronal plane, checking with an alignment rod.      Once this was done, I sized the femur to be a size 5 in the anterior-   posterior dimension,  chose a narrow component based on medial and   lateral dimension.  The size 5 rotation block was then pinned in   position anterior referenced using the C-clamp to set rotation.  The   anterior, posterior, and  chamfer cuts were made without difficulty nor   notching making certain that I was along the anterior cortex to help   with flexion gap stability.      The final box cut was made off the lateral aspect of distal femur.      At this point, the tibia was sized to be a size 4, the size 4 tray was   then pinned in position through the medial third of the tubercle,   drilled, and keel punched.  Trial reduction was now carried with a 5 femur,  4 tibia, a size 6 mm insert, and the 35 patella botton.  The knee was brought to   extension (after recutting 2 additional mm off the distal femur and revisiting the femoral cuts with 4 in 1 and box cuts), full extension with good flexion stability with the patella   tracking through the trochlea without application of pressure.  Given   all these findings femoral lugs were drilled and then the trial components removed.  Final components were   opened and cement was mixed.  The knee was irrigated with normal saline   solution and pulse lavage.  The synovial lining was   then injected with 30 cc of 0.25% Marcaine with epinephrine and 1 cc of Toradol plus 30 cc of NS for a  total of 61 cc.      The knee was irrigated.  Final implants were then cemented onto clean and   dried cut surfaces of bone with the knee brought to extension with a size 6 mm trial insert.      Once the cement had fully cured, the excess cement was removed   throughout the knee.  I confirmed I was satisfied with the range of   motion and stability, and the final size 6 mm  PS AOX insert was chosen.  It was   placed into the knee.      The tourniquet had been let down at 31 minutes.  No significant   hemostasis required.  The   extensor mechanism was then reapproximated using #1  Vicryl and #0 V-lock sutures with the knee   in flexion.  The   remaining wound was closed with 2-0 Vicryl and running 4-0 Monocryl.   The knee was cleaned, dried, dressed sterilely using Dermabond and   Aquacel dressing.  The patient was then   brought to recovery room in stable condition, tolerating the procedure   well.   Please note that Physician Assistant, Danae Orleans, PA-C, was present for the entirety of the case, and was utilized for pre-operative positioning, peri-operative retractor management, general facilitation of the procedure.  He was also utilized for primary wound closure at the end of the case.              Pietro Cassis Alvan Dame, M.D.    07/15/2015 11:56 AM

## 2015-07-15 NOTE — Progress Notes (Signed)
Utilization review completed.  

## 2015-07-15 NOTE — Progress Notes (Signed)
Demerol 6.25 mg IVP given for shaking and shivering 

## 2015-07-15 NOTE — Progress Notes (Signed)
Confirm need for orange isolation precautions with IP specialist, continue 1 yr after infection (April 2016)

## 2015-07-15 NOTE — Transfer of Care (Signed)
Immediate Anesthesia Transfer of Care Note  Patient: Bianca Haley  Procedure(s) Performed: Procedure(s): TOTAL LEFT KNEE ARTHROPLASTY (Left)  Patient Location: PACU  Anesthesia Type:Spinal  Level of Consciousness:  sedated, patient cooperative and responds to stimulation  Airway & Oxygen Therapy:Patient Spontanous Breathing and Patient connected to face mask oxgen  Post-op Assessment:  Report given to PACU RN and Post -op Vital signs reviewed and stable  Post vital signs:  Reviewed and stable  Last Vitals:  Filed Vitals:   07/15/15 0732  BP: 125/57  Pulse: 58  Temp: 36.9 C  Resp: 18    Complications: No apparent anesthesia complications

## 2015-07-15 NOTE — Interval H&P Note (Signed)
History and Physical Interval Note:  07/15/2015 10:08 AM  Bianca Haley  has presented today for surgery, with the diagnosis of LEFT KNEE OA   The various methods of treatment have been discussed with the patient and family. After consideration of risks, benefits and other options for treatment, the patient has consented to  Procedure(s): TOTAL LEFT KNEE ARTHROPLASTY (Left) as a surgical intervention .  The patient's history has been reviewed, patient examined, no change in status, stable for surgery.  I have reviewed the patient's chart and labs.  Questions were answered to the patient's satisfaction.     Mauri Pole

## 2015-07-15 NOTE — Anesthesia Postprocedure Evaluation (Signed)
Anesthesia Post Note  Patient: Bianca Haley  Procedure(s) Performed: Procedure(s) (LRB): TOTAL LEFT KNEE ARTHROPLASTY (Left)  Patient location during evaluation: PACU Anesthesia Type: Spinal Level of consciousness: awake and alert Pain management: pain level controlled Vital Signs Assessment: post-procedure vital signs reviewed and stable Respiratory status: spontaneous breathing, nonlabored ventilation, respiratory function stable and patient connected to nasal cannula oxygen Cardiovascular status: blood pressure returned to baseline and stable Postop Assessment: no signs of nausea or vomiting and spinal receding Anesthetic complications: no    Last Vitals:  Filed Vitals:   07/15/15 1337 07/15/15 1436  BP: 131/54 123/54  Pulse: 51 48  Temp: 36.5 C 36.4 C  Resp: 16 14    Last Pain:  Filed Vitals:   07/15/15 1515  PainSc: 5                  Taniya Dasher JENNETTE

## 2015-07-15 NOTE — Anesthesia Preprocedure Evaluation (Signed)
Anesthesia Evaluation  Patient identified by MRN, date of birth, ID band Patient awake    Reviewed: Allergy & Precautions, NPO status , Patient's Chart, lab work & pertinent test results  History of Anesthesia Complications Negative for: history of anesthetic complications  Airway Mallampati: II  TM Distance: >3 FB Neck ROM: Full    Dental no notable dental hx. (+) Dental Advisory Given   Pulmonary pneumonia (possible aspiration, followed by pulmonology, CXR clear, no current symptoms),    Pulmonary exam normal breath sounds clear to auscultation       Cardiovascular hypertension, Pt. on medications + Peripheral Vascular Disease  Normal cardiovascular exam Rhythm:Regular Rate:Normal     Neuro/Psych  Headaches, PSYCHIATRIC DISORDERS Anxiety    GI/Hepatic Neg liver ROS, GERD  Medicated and Controlled,  Endo/Other  negative endocrine ROS  Renal/GU negative Renal ROS  negative genitourinary   Musculoskeletal  (+) Arthritis , Osteoarthritis,    Abdominal   Peds negative pediatric ROS (+)  Hematology negative hematology ROS (+)   Anesthesia Other Findings   Reproductive/Obstetrics negative OB ROS                             Anesthesia Physical Anesthesia Plan  ASA: II  Anesthesia Plan: Spinal   Post-op Pain Management:    Induction:   Airway Management Planned:   Additional Equipment:   Intra-op Plan:   Post-operative Plan:   Informed Consent: I have reviewed the patients History and Physical, chart, labs and discussed the procedure including the risks, benefits and alternatives for the proposed anesthesia with the patient or authorized representative who has indicated his/her understanding and acceptance.   Dental advisory given  Plan Discussed with: CRNA  Anesthesia Plan Comments:         Anesthesia Quick Evaluation

## 2015-07-15 NOTE — Discharge Instructions (Signed)

## 2015-07-15 NOTE — Progress Notes (Signed)
Shaking and shivering stopped. 

## 2015-07-16 ENCOUNTER — Inpatient Hospital Stay (HOSPITAL_COMMUNITY): Payer: Medicare Other

## 2015-07-16 LAB — CBC
HCT: 32.3 % — ABNORMAL LOW (ref 36.0–46.0)
Hemoglobin: 10.4 g/dL — ABNORMAL LOW (ref 12.0–15.0)
MCH: 27 pg (ref 26.0–34.0)
MCHC: 32.2 g/dL (ref 30.0–36.0)
MCV: 83.9 fL (ref 78.0–100.0)
PLATELETS: 198 10*3/uL (ref 150–400)
RBC: 3.85 MIL/uL — AB (ref 3.87–5.11)
RDW: 13.9 % (ref 11.5–15.5)
WBC: 13.4 10*3/uL — AB (ref 4.0–10.5)

## 2015-07-16 LAB — BASIC METABOLIC PANEL
ANION GAP: 7 (ref 5–15)
BUN: 17 mg/dL (ref 6–20)
CO2: 27 mmol/L (ref 22–32)
Calcium: 9 mg/dL (ref 8.9–10.3)
Chloride: 106 mmol/L (ref 101–111)
Creatinine, Ser: 0.8 mg/dL (ref 0.44–1.00)
GFR calc Af Amer: >60 mL/min (ref >60)
Glucose, Bld: 167 mg/dL — ABNORMAL HIGH (ref 65–99)
POTASSIUM: 4.4 mmol/L (ref 3.5–5.1)
SODIUM: 140 mmol/L (ref 135–145)

## 2015-07-16 MED ORDER — TIZANIDINE HCL 4 MG PO TABS: 4.0000 mg | ORAL_TABLET | Freq: Four times a day (QID) | ORAL | Status: DC | PRN

## 2015-07-16 MED ORDER — CELECOXIB 200 MG PO CAPS: 200.0000 mg | ORAL_CAPSULE | Freq: Two times a day (BID) | ORAL | Status: DC

## 2015-07-16 MED ORDER — POLYETHYLENE GLYCOL 3350 17 G PO PACK: 17.0000 g | PACK | Freq: Two times a day (BID) | ORAL | Status: DC

## 2015-07-16 MED ORDER — OXYCODONE HCL 5 MG PO TABS
5.0000 mg | ORAL_TABLET | ORAL | Status: DC | PRN
  Administered 2015-07-17: 5 mg via ORAL
  Filled 2015-07-16: qty 2
  Filled 2015-07-16: qty 1
  Filled 2015-07-16: qty 2

## 2015-07-16 MED ORDER — OXYCODONE HCL 5 MG PO TABS: 5.0000 mg | ORAL_TABLET | ORAL | Status: DC | PRN

## 2015-07-16 MED ORDER — IOHEXOL 350 MG/ML SOLN
100.0000 mL | Freq: Once | INTRAVENOUS | Status: AC | PRN
  Administered 2015-07-16: 100 mL via INTRAVENOUS

## 2015-07-16 MED ORDER — DOCUSATE SODIUM 100 MG PO CAPS: 100.0000 mg | ORAL_CAPSULE | Freq: Two times a day (BID) | ORAL | Status: DC

## 2015-07-16 MED ORDER — ASPIRIN 325 MG PO TBEC: 325.0000 mg | DELAYED_RELEASE_TABLET | Freq: Two times a day (BID) | ORAL | Status: DC

## 2015-07-16 MED ORDER — FERROUS SULFATE 325 (65 FE) MG PO TABS: 325.0000 mg | ORAL_TABLET | Freq: Three times a day (TID) | ORAL | Status: DC

## 2015-07-16 NOTE — Care Management Note (Signed)
Case Management Note  Patient Details  Name: [name] MRN: [id number] Date of Birth: 29-Apr-1951  Subjective/Objective:                  TOTAL LEFT KNEE ARTHROPLASTY (Left) Action/Plan: Discharge planning Expected Discharge Date:  07/16/15               Expected Discharge Plan:  [place]  In-House Referral:     Discharge planning Services  CM Consult  Post Acute Care Choice:  Home Health Choice offered to:  Patient  DME Arranged:  N/A DME Agency:  NA  HH Arranged:  PT HH Agency:  [facility]  Status of Service:  Completed, signed off  Medicare Important Message Given:    Date Medicare IM Given:    Medicare IM give by:    Date Additional Medicare IM Given:    Additional Medicare Important Message give by:     If discussed at [place] of Stay Meetings, dates discussed:    Additional Comments: CM met with pt in room to offer choice of home health agency.  Pt chooses [facility] to render HHPT.  Referral given to [employer], Tim.  Pt has both rolling walker and elevated commode seat with handles at home.  No other CM needs were communicated. Dellie Catholic, RN 07/16/2015, 10:05 AM

## 2015-07-16 NOTE — Progress Notes (Signed)
PT Cancellation Note  Patient Details Name: Bianca Haley MRN: [id number] DOB: January 06, 1951   Cancelled Treatment:    Reason Eval/Treat Not Completed: Medical issues which prohibited therapy. Will check back on tomorrow.    Weston Anna, MPT Pager: [phone]

## 2015-07-16 NOTE — Progress Notes (Signed)
OT Cancellation Note  Patient Details Name: DIANN DEBES MRN: [id number] DOB: 07-30-1950   Cancelled Treatment:    Reason Eval/Treat Not Completed: Fatigue/lethargy limiting ability to participate.  Will check back later today or tomorrow.  Jeff Frieden 07/16/2015, 10:05 AM  Lesle Chris, OTR/L [phone] 07/16/2015

## 2015-07-16 NOTE — Evaluation (Signed)
Physical Therapy Evaluation Patient Details Name: LISSETT FOUGHT MRN: [id number] DOB: 01/23/1951 Today's Date: 07/16/2015   History of Present Illness  65 yo female admitted with L TKA  Clinical Impression  On eval, bed level exercises only this am. Spoke with RN who requested OOB activity be held for a short while this am. Also, pt was very drowsy. She could barely keep eyes open during session. Max verbal and tactile cueing required to complete exercises. Will check back later this am to attempt OOB if pt is able.     Follow Up Recommendations Home health PT;Supervision/Assistance - 24 hour    Equipment Recommendations  None recommended by PT    Recommendations for Other Services       Precautions / Restrictions Precautions Precautions: Fall;Knee Restrictions Weight Bearing Restrictions: No LLE Weight Bearing: Weight bearing as tolerated      Mobility  Bed Mobility               General bed mobility comments: Nt-RN requested OOB activity be held for a short while this am. Will assess during next session  Transfers                    Ambulation/Gait                Stairs            Wheelchair Mobility    Modified Rankin (Stroke Patients Only)       Balance                                             Pertinent Vitals/Pain Pain Assessment: Faces Faces Pain Scale: Hurts little more Pain Location: L knee with exercises Pain Descriptors / Indicators: Sore Pain Intervention(s): Monitored during session;Repositioned    Home Living Family/patient expects to be discharged to:: Private residence Living Arrangements: Alone Available Help at Discharge: Friend(s) (significant other) Type of Home:  (condo) Home Access: Stairs to enter Entrance Stairs-Rails: Psychiatric nurse of Steps: 4 Home Layout: One level Home Equipment: Walker - 2 wheels;Shower seat;Bedside commode      Prior Function Level of  Independence: Independent               Hand Dominance        Extremity/Trunk Assessment   Upper Extremity Assessment: Defer to OT evaluation           Lower Extremity Assessment: LLE deficits/detail   LLE Deficits / Details: hip flex 2/5, hip abd/add 2/5, moves ankle well.   Cervical / Trunk Assessment: Normal  Communication   Communication: No difficulties  Cognition Arousal/Alertness:  (very drowsy; difficulty keeping eyes open) Behavior During Therapy: WFL for tasks assessed/performed Overall Cognitive Status: Within Functional Limits for tasks assessed                      General Comments      Exercises Total Joint Exercises Ankle Circles/Pumps: AROM;Both;10 reps;Supine Quad Sets: AROM;Both;10 reps;Supine Heel Slides: AAROM;Left;10 reps;Supine Hip ABduction/ADduction: AAROM;Left;10 reps;Supine Straight Leg Raises: AAROM;Left;10 reps;Supine Goniometric ROM: ~10-50 degrees      Assessment/Plan    PT Assessment Patient needs continued PT services  PT Diagnosis Difficulty walking;Acute pain   PT Problem List Decreased strength;Decreased range of motion;Decreased activity tolerance;Decreased balance;Decreased mobility;Decreased knowledge of use of DME;Pain  PT Treatment Interventions DME instruction;Gait  training;Functional mobility training;Therapeutic activities;Patient/family education;Balance training;Therapeutic exercise   PT Goals (Current goals can be found in the Care Plan section) Acute Rehab PT Goals Patient Stated Goal: none stated PT Goal Formulation: With patient Time For Goal Achievement: 07/23/15 Potential to Achieve Goals: Good    Frequency 7X/week   Barriers to discharge        Co-evaluation               End of Session Equipment Utilized During Treatment: Oxygen Activity Tolerance:  (Limited by drowsiness) Patient left: in bed;with call bell/phone within reach;with bed alarm set           Time:  [id number] PT Time Calculation (min) (ACUTE ONLY): 11 min   Charges:   PT Evaluation $PT Eval Low Complexity: 1 Procedure     PT G Codes:        Weston Anna, MPT Pager: [phone]

## 2015-07-16 NOTE — Progress Notes (Signed)
Patient ID: Bianca Haley, female   DOB: 1951/04/02, 65 y.o.   MRN: [id number] Subjective: 1 Day Post-Op Procedure(s) (LRB): TOTAL LEFT KNEE ARTHROPLASTY (Left)    Patient reports pain as mild to moderate.  Stable over night.  Notices some rattling in her chest  Objective:   VITALS:   Filed Vitals:   07/16/15 0552 07/16/15 0855  BP: 115/49 90/49  Pulse: 81   Temp: 98.2 F (36.8 C)   Resp: 16     Neurovascular intact Incision: dressing C/D/I  LABS  Recent Labs  07/16/15 0403  HGB 10.4*  HCT 32.3*  WBC 13.4*  PLT 198     Recent Labs  07/16/15 0540  NA 140  K 4.4  BUN 17  CREATININE 0.80  GLUCOSE 167*    No results for input(s): LABPT, INR in the last 72 hours.   Assessment/Plan: 1 Day Post-Op Procedure(s) (LRB): TOTAL LEFT KNEE ARTHROPLASTY (Left)   Advance diet Up with therapy Discharge home with home health   Reviewed goals Reviewed questions as it pertains to her pain meds  Incentive spirometry for breathing restoration

## 2015-07-17 LAB — CBC
HEMATOCRIT: 26.3 % — AB (ref 36.0–46.0)
HEMOGLOBIN: 8.6 g/dL — AB (ref 12.0–15.0)
MCH: 26.4 pg (ref 26.0–34.0)
MCHC: 32.7 g/dL (ref 30.0–36.0)
MCV: 80.7 fL (ref 78.0–100.0)
PLATELETS: 156 10*3/uL (ref 150–400)
RBC: 3.26 MIL/uL — ABNORMAL LOW (ref 3.87–5.11)
RDW: 14.3 % (ref 11.5–15.5)
WBC: 13.8 10*3/uL — ABNORMAL HIGH (ref 4.0–10.5)

## 2015-07-17 LAB — BASIC METABOLIC PANEL
ANION GAP: 8 (ref 5–15)
BUN: 17 mg/dL (ref 6–20)
CALCIUM: 8.6 mg/dL — AB (ref 8.9–10.3)
CO2: 28 mmol/L (ref 22–32)
Chloride: 105 mmol/L (ref 101–111)
Creatinine, Ser: 1.01 mg/dL — ABNORMAL HIGH (ref 0.44–1.00)
GFR, EST NON AFRICAN AMERICAN: 57 mL/min — AB (ref >60)
Glucose, Bld: 134 mg/dL — ABNORMAL HIGH (ref 65–99)
Potassium: 4.2 mmol/L (ref 3.5–5.1)
SODIUM: 141 mmol/L (ref 135–145)

## 2015-07-17 MED ORDER — LORAZEPAM 2 MG/ML IJ SOLN: 0.2500 mg | INTRAMUSCULAR | Status: DC | PRN

## 2015-07-17 MED ORDER — OXYCODONE HCL 5 MG PO TABS
5.0000 mg | ORAL_TABLET | ORAL | Status: DC | PRN
Start: 2015-07-17 — End: 2016-06-19

## 2015-07-17 MED ORDER — ACETAMINOPHEN 325 MG PO TABS
325.0000 mg | ORAL_TABLET | Freq: Four times a day (QID) | ORAL | Status: DC | PRN
  Administered 2015-07-18: 650 mg via ORAL
  Filled 2015-07-17: qty 2

## 2015-07-17 MED ORDER — DIAZEPAM 5 MG/ML IJ SOLN
5.0000 mg | Freq: Two times a day (BID) | INTRAMUSCULAR | Status: DC | PRN
  Administered 2015-07-17: 5 mg via INTRAVENOUS
  Filled 2015-07-17: qty 2

## 2015-07-17 MED ORDER — ASPIRIN 325 MG PO TBEC: 325.0000 mg | DELAYED_RELEASE_TABLET | Freq: Two times a day (BID) | ORAL | Status: AC

## 2015-07-17 NOTE — Progress Notes (Addendum)
Physical Therapy Treatment Patient Details Name: TAMMARA FARIS MRN: [id number] DOB: 1951-02-25 Today's Date: 07/17/2015    History of Present Illness 65 yo female admitted with L TKA    PT Comments    Progressing slowly with mobility. O2 sats 87% on 1L O2 and >90% on 2L O2 during ambulation. Will plan to practice steps on tomorrow if able. Pt remains confused. Pt will absolutely require 24 hour supervision/assist.   Follow Up Recommendations  Home health PT;Supervision/Assistance - 24 hour     Equipment Recommendations  None recommended by PT    Recommendations for Other Services       Precautions / Restrictions Precautions Precautions: Fall;Knee Restrictions Weight Bearing Restrictions: No LLE Weight Bearing: Weight bearing as tolerated    Mobility  Bed Mobility Overal bed mobility: Needs Assistance Bed Mobility: Supine to Sit;Sit to Supine     Supine to sit: Min assist Sit to supine: Min assist   General bed mobility comments: Assist for LE  Transfers Overall transfer level: Needs assistance Equipment used: Rolling walker (2 wheeled) Transfers: Sit to/from Stand Sit to Stand: Min assist         General transfer comment: assist to rise and stabilize.  VCs for UE/LE placement and safety. Pt attempted to stand without walker  Ambulation/Gait Ambulation/Gait assistance: Min assist Ambulation Distance (Feet): 75 Feet Assistive device: Rolling walker (2 wheeled) Gait Pattern/deviations: Step-to pattern;Trunk flexed;Antalgic     General Gait Details: Assist to stabilize pt and to maneuver safely with walker. O2 sats 87% on 1L O2, >90% on 2L during ambulation.    Stairs            Wheelchair Mobility    Modified Rankin (Stroke Patients Only)       Balance                                    Cognition Arousal/Alertness: Awake/alert Behavior During Therapy: WFL for tasks assessed/performed Overall Cognitive Status:  Impaired/Different from baseline Area of Impairment: Following commands;Safety/judgement;Attention     Memory: Decreased recall of precautions;Decreased short-term memory   Safety/Judgement: Decreased awareness of safety     General Comments: cues for safety:  responded better to cueing this session    Exercises      General Comments        Pertinent Vitals/Pain Pain Assessment: Faces Faces Pain Scale: Hurts even more Pain Location: L knee Pain Descriptors / Indicators: Sore Pain Intervention(s): Monitored during session;Repositioned    Home Living                      Prior Function            PT Goals (current goals can now be found in the care plan section) Progress towards PT goals: Progressing toward goals (slowly)    Frequency  7X/week    PT Plan Current plan remains appropriate    Co-evaluation             End of Session Equipment Utilized During Treatment: Oxygen Activity Tolerance: Patient tolerated treatment well Patient left: in bed;with call bell/phone within reach;with family/visitor present;with bed alarm set     Time: 1353-1420 PT Time Calculation (min) (ACUTE ONLY): 27 min  Charges:  $Gait Training: 8-22 mins $Therapeutic Activity: 8-22 mins                    G  Codes:      Weston Anna, MPT Pager: [phone]

## 2015-07-17 NOTE — Evaluation (Signed)
Occupational Therapy Evaluation Patient Details Name: Bianca Haley MRN: [id number] DOB: 03-27-1951 Today's Date: 07/17/2015    History of Present Illness 65 yo female admitted with L TKA   Clinical Impression   Pt was admitted for the above.  She will benefit from continued OT to increase safety with adls.  Goals in acute are for min guard level.  Pt currently needs up to max A and she needs reinforcement for walker safety/sequencing.    Follow Up Recommendations  Supervision/Assistance - 24 hour, possibly HHOT, depending upon progress   Equipment Recommendations   (per chart, she will borrow a 3:1 commode)    Recommendations for Other Services       Precautions / Restrictions Precautions Precautions: Fall;Knee Restrictions Weight Bearing Restrictions: No LLE Weight Bearing: Weight bearing as tolerated      Mobility Bed Mobility Overal bed mobility: Needs Assistance Bed Mobility: Supine to Sit     Supine to sit: Min assist     General bed mobility comments: assist for LLE  Transfers Overall transfer level: Needs assistance Equipment used: Rolling walker (2 wheeled) Transfers: Sit to/from [facility] Sit to Stand: Min assist Stand pivot transfers: Min assist       General transfer comment: cues for UE/LE placement.  Max cues for walker safety and assist to keep back framing her body    Balance                                            ADL Overall ADL's : Needs assistance/impaired     Grooming: Set up;Sitting;Applying deodorant   Upper Body Bathing: Set up;Sitting   Lower Body Bathing: Moderate assistance;Sit to/from stand   Upper Body Dressing : Sitting;Minimal assistance (assist for 02 line)   Lower Body Dressing: Maximal assistance;Sit to/from stand   Toilet Transfer: Minimal assistance;Stand-pivot;RW (chair)             General ADL Comments: pt on 2 liters of 02.  Sats dropped to 88% once but quickly  recovered.  Cues for safety during adl.  Pt had some difficulty with word finding at times, and she was aware of this.       Vision     Perception     Praxis      Pertinent Vitals/Pain Pain Assessment: Faces Faces Pain Scale: Hurts little more Pain Location: L knee Pain Intervention(s): Limited activity within patient's tolerance;Monitored during session;Repositioned     Hand Dominance Right   Extremity/Trunk Assessment Upper Extremity Assessment Upper Extremity Assessment: Overall WFL for tasks assessed           Communication Communication Communication:  (using wrong words at times--aware)   Cognition Arousal/Alertness: Awake/alert Behavior During Therapy: WFL for tasks assessed/performed Overall Cognitive Status: Impaired/Different from baseline Area of Impairment: Following commands;Safety/judgement               General Comments: had difficulty sequencing with RW, keeping it near body.  Cues for safety--appeared ready to stand once without RW; needed assistance to keep walker around her body   General Comments       Exercises       Shoulder Instructions      Home Living Family/patient expects to be discharged to:: Private residence   Available Help at Discharge: Friend(s)  Bathroom Toilet: Standard     Home Equipment: Environmental consultant - 2 wheels;Shower seat;Bedside commode   Additional Comments: unsure if pt has tub or shower stall      Prior Functioning/Environment Level of Independence: Independent             OT Diagnosis: Generalized weakness   OT Problem List: Decreased activity tolerance;Pain;Decreased safety awareness;Decreased knowledge of use of DME or AE   OT Treatment/Interventions: Self-care/ADL training;DME and/or AE instruction;Patient/family education    OT Goals(Current goals can be found in the care plan section) Acute Rehab OT Goals Patient Stated Goal: none stated OT Goal Formulation: With  patient Time For Goal Achievement: 07/24/15 Potential to Achieve Goals: Good ADL Goals Pt Will Perform Grooming: with min guard assist;standing Pt Will Transfer to Toilet: with min guard assist;ambulating;bedside commode Additional ADL Goal #1: pt will complete toilet hygiene at min guard level Additional ADL Goal #2: pt will not need any more than 2 vcs per activity for safety  OT Frequency: Min 2X/week   Barriers to D/C:            Co-evaluation              End of Session    Activity Tolerance: Patient tolerated treatment well Patient left: in chair;with call bell/phone within reach;with chair alarm set   Time: [id number] OT Time Calculation (min): 29 min Charges:  OT General Charges $OT Visit: 1 Procedure OT Evaluation $OT Eval Low Complexity: 1 Procedure OT Treatments $Self Care/Home Management : 8-22 mins G-Codes:    Decklyn Hornik August 03, 2015, 10:07 AM  Lesle Chris, OTR/L [phone] 08/03/15

## 2015-07-17 NOTE — Progress Notes (Addendum)
Occupational Therapy Treatment Patient Details Name: Bianca Haley MRN: [id number] DOB: 1951/02/24 Today's Date: 07/17/2015    History of present illness 65 yo female admitted with L TKA   OT comments  Pt improving, but still needs close 24/7 for safety and assist with adls.  Educated signficant other.  Recommend HHOT follow her to promote independence and further educate on safety  Follow Up Recommendations  Home health OT;Supervision/Assistance - 24 hour (close supervision for safety)    Equipment Recommendations  3 in 1 bedside comode    Recommendations for Other Services      Precautions / Restrictions Precautions Precautions: Fall;Knee Restrictions LLE Weight Bearing: Weight bearing as tolerated       Mobility Bed Mobility               General bed mobility comments: oob  Transfers   Equipment used: Rolling walker (2 wheeled) Transfers: Sit to/from Stand Sit to Stand: Min assist         General transfer comment: assist to rise and stabilize.  VCs for UE/LE placement and safety. Pt attempted to stand without walker    Balance                                   ADL       Grooming: Min guard;Oral care;Standing                                 General ADL Comments: pt had just used restroom, ambulated 3' to sink and performed oral care with min guard for safety. Pt on 1 liter of 02--sats dropped to 87% but quickly recovered into 90s. Significant other came in and reviewed assistance needed for self care, cueing for sequence.  Also demonstrated shower transfer for him, although she is not ready for this at this time.        Vision                     Perception     Praxis      Cognition   Behavior During Therapy: WFL for tasks assessed/performed Overall Cognitive Status: Impaired/Different from baseline            Safety/Judgement: Decreased awareness of safety     General Comments: cues for safety:   responded better to cueing this session    Extremity/Trunk Assessment               Exercises     Shoulder Instructions       General Comments      Pertinent Vitals/ Pain       Faces Pain Scale: Hurts little more Pain Location: L knee Pain Descriptors / Indicators: Sore Pain Intervention(s): Ice applied  Home Living                                          Prior Functioning/Environment              Frequency       Progress Toward Goals  OT Goals(current goals can now be found in the care plan section)  Progress towards OT goals: Progressing toward goals     Plan      Co-evaluation  End of Session     Activity Tolerance Patient tolerated treatment well   Patient Left in chair;with call bell/phone within reach;with chair alarm set   Nurse Communication          Time: [id number] OT Time Calculation (min): 13 min  Charges: OT General Charges $OT Visit: 1 Procedure OT Treatments $Self Care/Home Management : 8-22 mins  Charnell Peplinski 07/17/2015, 3:02 PM  Lesle Chris, OTR/L [phone] 07/17/2015

## 2015-07-17 NOTE — Progress Notes (Signed)
Physical Therapy Treatment Patient Details Name: Bianca Haley MRN: [id number] DOB: August 17, 1950 Today's Date: 07/17/2015    History of Present Illness 65 yo female admitted with L TKA    PT Comments    Progressing with mobility. Pt is confused. O2 sats 92% on 2L at rest, 89% on 2L during ambulation. Dyspnea 2/4. Pt appeared to tolerate activity fairly well. She will absolutely require 24 hour supervision/assist. Will have a 2nd session later today.   Follow Up Recommendations  Home health PT;Supervision/Assistance - 24 hour     Equipment Recommendations  None recommended by PT    Recommendations for Other Services       Precautions / Restrictions Precautions Precautions: Fall;Knee Restrictions Weight Bearing Restrictions: No LLE Weight Bearing: Weight bearing as tolerated    Mobility  Bed Mobility      General bed mobility comments: oob in recliner  Transfers Overall transfer level: Needs assistance Equipment used: Rolling walker (2 wheeled) Transfers: Sit to/from Stand Sit to Stand: Min assist        General transfer comment: Assist to rise, stabilize, contrl descent. VCs safety, technique, hand placement  Ambulation/Gait Ambulation/Gait assistance: Min assist Ambulation Distance (Feet): 100 Feet Assistive device: Rolling walker (2 wheeled) Gait Pattern/deviations: Step-to pattern;Trunk flexed;Antalgic     General Gait Details: Assist to stabilize pt and to maneuver safely with walker. O2 sats 89% on 2L O2 during ambulation. Pt reported mild shortness of breath but she appeared to tolerate distance fairly well.    Stairs            Wheelchair Mobility    Modified Rankin (Stroke Patients Only)       Balance                                    Cognition Arousal/Alertness: Awake/alert Behavior During Therapy: WFL for tasks assessed/performed Overall Cognitive Status: Impaired/Different from baseline Area of Impairment:  Following commands;Attention;Safety/judgement   Current Attention Level: Alternating Memory: Decreased recall of precautions;Decreased short-term memory   Safety/Judgement: Decreased awareness of safety     General Comments: had difficulty sequencing with RW, keeping it near body.  Cues for safety--appeared ready to stand once without RW; needed assistance to keep walker around her body    Exercises Total Joint Exercises Ankle Circles/Pumps: AROM;Both;10 reps;Supine Quad Sets:  (pt could not process how to peform despite multimodal cueing) Heel Slides: AAROM;Left;10 reps;Supine Hip ABduction/ADduction: AAROM;Left;10 reps;Supine Straight Leg Raises: AAROM;Left;10 reps;Supine Goniometric ROM: ~15-60 degrees    General Comments        Pertinent Vitals/Pain Pain Assessment: Faces Faces Pain Scale: Hurts even more Pain Location: L knee with activity Pain Descriptors / Indicators: Sore;Sharp Pain Intervention(s): Ice applied;Monitored during session;Repositioned    Home Living Family/patient expects to be discharged to:: Private residence   Available Help at Discharge: Friend(s)         Home Equipment: Gilford Rile - 2 wheels;Shower seat;Bedside commode Additional Comments: unsure if pt has tub or shower stall    Prior Function Level of Independence: Independent          PT Goals (current goals can now be found in the care plan section) Acute Rehab PT Goals Patient Stated Goal: none stated Progress towards PT goals: Progressing toward goals    Frequency  7X/week    PT Plan Current plan remains appropriate    Co-evaluation  End of Session Equipment Utilized During Treatment: Oxygen Activity Tolerance: Patient tolerated treatment well Patient left: in chair;with call bell/phone within reach;with chair alarm set     Time: [id number] PT Time Calculation (min) (ACUTE ONLY): 21 min  Charges:  $Gait Training: 8-22 mins                    G Codes:       Weston Anna, MPT Pager: [phone]

## 2015-07-17 NOTE — Progress Notes (Signed)
     Subjective: 2 Days Post-Op Procedure(s) (LRB): TOTAL LEFT KNEE ARTHROPLASTY (Left)   Patient reports pain as mild, pain controlled.  Decreased O2 Sat on room air and has been needing to use 1-2 lpm of O2 to maintain a O2 Sat in the 90%.  Chest CT was negative for PE.  Wil continue to monitor.  Feeling is she is overly sedated.  Have d/c'ed the Oxycodone and will have her maintain her usual dose of Opana and APAP if needing anything else.  Objective:   VITALS:   Filed Vitals:   07/17/15 0236 07/17/15 0504  BP: 123/53 113/38  Pulse: 91 80  Temp: 98.3 F (36.8 C) 97.3 F (36.3 C)  Resp: 17 17    Dorsiflexion/Plantar flexion intact Incision: dressing C/D/I No cellulitis present Compartment soft  LABS  Recent Labs  07/16/15 0403 07/17/15 0424  HGB 10.4* 8.6*  HCT 32.3* 26.3*  WBC 13.4* 13.8*  PLT 198 156     Recent Labs  07/16/15 0540 07/17/15 0424  NA 140 141  K 4.4 4.2  BUN 17 17  CREATININE 0.80 1.01*  GLUCOSE 167* 134*     Assessment/Plan: 2 Days Post-Op Procedure(s) (LRB): TOTAL LEFT KNEE ARTHROPLASTY (Left)   Up with therapy Discharge home with home health eventually, when ready   West Pugh. Bianca Haley   PAC  07/17/2015, 9:15 AM

## 2015-07-18 ENCOUNTER — Other Ambulatory Visit: Payer: Self-pay | Admitting: Internal Medicine

## 2015-07-18 LAB — CBC
HCT: 29.1 % — ABNORMAL LOW (ref 36.0–46.0)
Hemoglobin: 9.2 g/dL — ABNORMAL LOW (ref 12.0–15.0)
MCH: 26.8 pg (ref 26.0–34.0)
MCHC: 31.6 g/dL (ref 30.0–36.0)
MCV: 84.8 fL (ref 78.0–100.0)
PLATELETS: 187 10*3/uL (ref 150–400)
RBC: 3.43 MIL/uL — AB (ref 3.87–5.11)
RDW: 14.5 % (ref 11.5–15.5)
WBC: 11.4 10*3/uL — AB (ref 4.0–10.5)

## 2015-07-18 LAB — BASIC METABOLIC PANEL
ANION GAP: 8 (ref 5–15)
BUN: 11 mg/dL (ref 6–20)
CALCIUM: 9.1 mg/dL (ref 8.9–10.3)
CO2: 31 mmol/L (ref 22–32)
Chloride: 104 mmol/L (ref 101–111)
Creatinine, Ser: 0.61 mg/dL (ref 0.44–1.00)
GFR calc Af Amer: >60 mL/min (ref >60)
GFR calc non Af Amer: >60 mL/min (ref >60)
GLUCOSE: 119 mg/dL — AB (ref 65–99)
Potassium: 4.2 mmol/L (ref 3.5–5.1)
SODIUM: 143 mmol/L (ref 135–145)

## 2015-07-18 MED ORDER — ACETAMINOPHEN 325 MG PO TABS: 325.0000 mg | ORAL_TABLET | Freq: Four times a day (QID) | ORAL | Status: DC | PRN

## 2015-07-18 NOTE — Progress Notes (Signed)
     Subjective: 3 Days Post-Op Procedure(s) (LRB): TOTAL LEFT KNEE ARTHROPLASTY (Left)   Patient reports pain as mild, pain controlled. Somewhat agitated yesterday evening, much better today.  She is still somewhat sleepy, but more aware this morning.  Still needing O2. Will see how she does off of O2 today.  Objective:   VITALS:   Filed Vitals:   07/17/15 2119 07/18/15 0503  BP: 133/57 123/64  Pulse: 78 84  Temp: 97.4 F (36.3 C) 97.8 F (36.6 C)  Resp:  20    Dorsiflexion/Plantar flexion intact Incision: dressing C/D/I No cellulitis present Compartment soft  LABS  Recent Labs  07/16/15 0403 07/17/15 0424 07/18/15 0820  HGB 10.4* 8.6* 9.2*  HCT 32.3* 26.3* 29.1*  WBC 13.4* 13.8* 11.4*  PLT 198 156 187     Recent Labs  07/16/15 0540 07/17/15 0424  NA 140 141  K 4.4 4.2  BUN 17 17  CREATININE 0.80 1.01*  GLUCOSE 167* 134*     Assessment/Plan: 3 Days Post-Op Procedure(s) (LRB): TOTAL LEFT KNEE ARTHROPLASTY (Left) Up with therapy Discharge home with home health  Follow up in 2 weeks at [facility]. Follow up with OLIN,Sian Joles D in 2 weeks.  Contact information:  [facility] [address], [address] [place] Sosaia Pittinger   PAC  07/18/2015, 8:57 AM

## 2015-07-18 NOTE — Progress Notes (Signed)
Physical Therapy Treatment Patient Details Name: Bianca Haley MRN: [id number] DOB: 12/23/1950 Today's Date: 07/18/2015    History of Present Illness 65 yo female admitted with L TKA    PT Comments    Progressing with mobility. Some improvement in cognition compared to yesterday. Explained to pt and caregiver that she will absolutely require 24 hour supervision/assist-they both acknowledge this. Will return for a 2nd session to practice stair negotiation. O2 sats 89% on 2L during ambulation.  Follow Up Recommendations  Home health PT;Supervision/Assistance - 24 hour     Equipment Recommendations  None recommended by PT    Recommendations for Other Services       Precautions / Restrictions Precautions Precautions: Fall;Knee Restrictions Weight Bearing Restrictions: No LLE Weight Bearing: Weight bearing as tolerated    Mobility  Bed Mobility Overal bed mobility: Needs Assistance Bed Mobility: Supine to Sit     Supine to sit: Min assist     General bed mobility comments: Assist for LE  Transfers Overall transfer level: Needs assistance Equipment used: Rolling walker (2 wheeled) Transfers: Sit to/from Stand Sit to Stand: Min assist         General transfer comment: assist to rise and stabilize.  VCs for UE/LE placement and safety.   Ambulation/Gait Ambulation/Gait assistance: Min guard Ambulation Distance (Feet): 100 Feet Assistive device: Rolling walker (2 wheeled) Gait Pattern/deviations: Trunk flexed;Antalgic;Step-to pattern     General Gait Details: Assist to stabilize pt and to maneuver safely with walker. O2 sats 89% on 2L O2 during ambulation. VCs safety, postue, distance from [employer] Mobility    Modified Rankin (Stroke Patients Only)       Balance                                    Cognition Arousal/Alertness: Awake/alert Behavior During Therapy: WFL for tasks assessed/performed Overall  Cognitive Status: Impaired/Different from baseline Area of Impairment: Following commands       Following Commands: Follows multi-step commands inconsistently Safety/Judgement: Decreased awareness of safety     General Comments: cues for safety:  responded better to cueing this session    Exercises Total Joint Exercises Ankle Circles/Pumps: AROM;Both;10 reps;Supine Quad Sets: AROM;Both;10 reps;Supine Heel Slides: AAROM;Left;10 reps;Supine Hip ABduction/ADduction: AAROM;Left;10 reps;Supine Straight Leg Raises: AAROM;AROM;Left;10 reps;Supine Goniometric ROM: ~10-60 degrees    General Comments        Pertinent Vitals/Pain Pain Assessment: Faces Faces Pain Scale: Hurts even more Pain Location: L knee with activity Pain Descriptors / Indicators: Sore Pain Intervention(s): Monitored during session;Repositioned;Ice applied    Home Living                      Prior Function            PT Goals (current goals can now be found in the care plan section) Progress towards PT goals: Progressing toward goals    Frequency  7X/week    PT Plan Current plan remains appropriate    Co-evaluation             End of Session Equipment Utilized During Treatment: Oxygen Activity Tolerance: Patient tolerated treatment well Patient left: in chair;with call bell/phone within reach;with chair alarm set;with family/visitor present     Time: [id number] PT Time Calculation (min) (ACUTE ONLY): 33 min  Charges:  $Gait Training: 8-22  mins $Therapeutic Exercise: 8-22 mins                    G Codes:      Weston Anna, MPT Pager: [phone]

## 2015-07-18 NOTE — Progress Notes (Addendum)
Safety sitter at bedside since 1900.bed alarm won't ring at the nurses station- [employer] and [employer] aware.Noted sats 84% on R/a and 93% on 2L [place] denies using any 02 at home.Pt is high fall risk with confusion today.alert oriented and cooperative x 4 until walked out into hall after going to the bathroom at 2315 on 3/15 then became disoriented and anxious,talking nonsense statements,unable to answer any questions appropriately.Shift report pt recovering alcoholic with no etoh in 5 yrs,yet info in admission hx states last drink was several months ago.has been napping and is sleeping at present. Aggie Moats D

## 2015-07-18 NOTE — Progress Notes (Addendum)
Per Weston Anna PT, patient's O2 sats drop to 88% on 2 liters while ambulating. Ambulated a distance of 100 feet this morning and 40 feet this afternoon.

## 2015-07-18 NOTE — Progress Notes (Addendum)
Physical Therapy Treatment Patient Details Name: DEOLA WITTMAN MRN: [id number] DOB: December 03, 1950 Today's Date: 07/18/2015  SATURATION QUALIFICATIONS: (This note is used to comply with regulatory documentation for home oxygen)  Patient Saturations on Room Air at Rest = 83%  Patient Saturations on [employer] while Ambulating = n/a  Patient Saturations on 2 Liters of oxygen while Ambulating = 88-89%     History of Present Illness 65 yo female admitted with L TKA    PT Comments    Pt participated well with session. Continues to require cues for safety. Pt remains confused. O2 sats: 83% on RA at rest, 88-89% on 2L O2 during ambulation. Pt completed all therapy tasks on today. Made RN aware of O2 levels. Highly recommend 24 hour supervision/assist    Follow Up Recommendations  Home health PT;Supervision/Assistance - 24 hour     Equipment Recommendations  None recommended by PT    Recommendations for Other Services       Precautions / Restrictions Precautions Precautions: Knee;Fall Restrictions Weight Bearing Restrictions: No LLE Weight Bearing: Weight bearing as tolerated    Mobility  Bed Mobility               General bed mobility comments: oob in recliner  Transfers Overall transfer level: Needs assistance Equipment used: Rolling walker (2 wheeled) Transfers: Sit to/from Stand Sit to Stand: Min guard         General transfer comment: close guard for safety. VCs hand placement  Ambulation/Gait Ambulation/Gait assistance: Min guard Ambulation Distance (Feet): 40 Feet Assistive device: Rolling walker (2 wheeled) Gait Pattern/deviations: Step-to pattern;Trunk flexed     General Gait Details: close guard for safety. O2 sats 88-89% on 2L O2 during ambulation. VCs safety, posture, distance from RW   Stairs Stairs: Yes Stairs assistance: Min assist Stair Management: One rail Right;Step to pattern;Forwards Number of Stairs: 5 (up and over portable steps  x2) General stair comments: 1 rail, 1 HHA. Practiced x2 (once with therapist, once with caregiver). VCs safety, sequence, technique.   Wheelchair Mobility    Modified Rankin (Stroke Patients Only)       Balance                                    Cognition Arousal/Alertness: Awake/alert Behavior During Therapy: WFL for tasks assessed/performed Overall Cognitive Status: Impaired/Different from baseline Area of Impairment: Safety/judgement     Memory: Decreased recall of precautions;Decreased short-term memory Following Commands: Follows multi-step commands with increased time Safety/Judgement: Decreased awareness of safety     General Comments: cues for safety:  responded better to cueing this session    Exercises      General Comments        Pertinent Vitals/Pain Pain Assessment: Faces Faces Pain Scale: Hurts little more Pain Location: L knee with activity Pain Descriptors / Indicators: Sore Pain Intervention(s): Monitored during session;Repositioned    Home Living                      Prior Function            PT Goals (current goals can now be found in the care plan section) Progress towards PT goals: Progressing toward goals    Frequency  7X/week    PT Plan Current plan remains appropriate    Co-evaluation             End of Session Equipment  Utilized During Treatment: Oxygen Activity Tolerance: Patient tolerated treatment well Patient left: in chair;with call bell/phone within reach;with chair alarm set     Time: 1350-1407 PT Time Calculation (min) (ACUTE ONLY): 17 min  Charges:  $Gait Training: 8-22 mins                    G Codes:      Weston Anna, MPT Pager: [phone]

## 2015-07-18 NOTE — Progress Notes (Signed)
Occupational Therapy Treatment Patient Details Name: Bianca Haley MRN: [id number] DOB: [birth year] Today's Date: 07/18/2015    History of present illness 65 yo female admitted with L TKA   OT comments  Doing better with following commands and bathroom transfers.  Significant other reports that she has been very active, not resting much  Follow Up Recommendations  Home health OT;Supervision/Assistance - 24 hour    Equipment Recommendations  3 in 1 bedside comode (she thinks she might have this)    Recommendations for Other Services      Precautions / Restrictions Precautions Precautions: Knee;Fall Restrictions Weight Bearing Restrictions: No LLE Weight Bearing: Weight bearing as tolerated       Mobility Bed Mobility               General bed mobility comments: oob in recliner  Transfers Overall transfer level: Needs assistance Equipment used: Rolling walker (2 wheeled) Transfers: Sit to/from Stand Sit to Stand: Min guard         General transfer comment: close guard for safety. VCs hand placement    Balance                                   ADL                           Toilet Transfer: Min guard;Ambulation;BSC;RW (supervision for hygiene, sit to lean)       Tub/ Shower Transfer: Min guard;Ambulation;Walk-in shower     General ADL Comments: pt performed bathroom transfers, and significant other observed.  Initially had to hold walker back as pt was pushing too far away:  Mod cueing Also had difficulty with sequencing, but did better 1/2 half of tx      Vision                     Perception     Praxis      Cognition   Behavior During Therapy: [facility] for tasks assessed/performed Overall Cognitive Status: Impaired/Different from baseline Area of Impairment: Safety/judgement     Memory: Decreased recall of precautions;Decreased short-term memory  Following Commands: Follows multi-step commands with increased  time Safety/Judgement: Decreased awareness of safety     General Comments: improved cognition; multimodal cues initiallly for walker distance    Extremity/Trunk Assessment               Exercises     Shoulder Instructions       General Comments      Pertinent Vitals/ Pain       Pain Assessment: Faces Faces Pain Scale: Hurts a little bit Pain Location: L knee Pain Descriptors / Indicators: Sore Pain Intervention(s): Limited activity within patient's tolerance;Monitored during session;Repositioned  Home Living                                          Prior Functioning/Environment              Frequency       Progress Toward Goals  OT Goals(current goals can now be found in the care plan section)  Progress towards OT goals: Progressing toward goals     Plan      Co-evaluation  End of Session     Activity Tolerance Patient tolerated treatment well   Patient Left in chair;with call bell/phone within reach;with chair alarm set;with family/visitor present   Nurse Communication          Time: 1301-1320 OT Time Calculation (min): 19 min  Charges: OT General Charges $OT Visit: 1 Procedure OT Treatments $Self Care/Home Management : 8-22 mins  Zeenat Jeanbaptiste 07/18/2015, 2:34 PM Lesle Chris, OTR/L [phone] 07/18/2015

## 2015-07-18 NOTE — Care Management Important Message (Signed)
Important Message  Patient Details IM Letter given to [name]/Case Manager to present to Patient Name: [name] MRN: [id number] Date of Birth: Nov 18, 1950   Medicare Important Message Given:  Yes    Camillo Flaming 07/18/2015, 9:17 AMImportant Message  Patient Details  Name: Bianca Haley MRN: [id number] Date of Birth: 10-11-1950   Medicare Important Message Given:  Yes    Camillo Flaming 07/18/2015, 9:17 AM

## 2015-07-18 NOTE — Progress Notes (Signed)
Per PA request, pt O2 saturation taken after pt worked with incentive spirometer.  Sitting (at rest) pt O2sat 93% room air; ambulating to bathroom pt O2 saturation is 99% room air.  Pt was encouraged to continue to use her incentive spirometer.  Pt does NOT meet criteria for home oxygen.  No other CM needs were communicated.

## 2015-07-19 ENCOUNTER — Telehealth: Payer: Self-pay | Admitting: *Deleted

## 2015-07-19 DIAGNOSIS — G8929 Other chronic pain: Secondary | ICD-10-CM | POA: Diagnosis not present

## 2015-07-19 DIAGNOSIS — Z471 Aftercare following joint replacement surgery: Secondary | ICD-10-CM | POA: Diagnosis not present

## 2015-07-19 DIAGNOSIS — M720 Palmar fascial fibromatosis [Dupuytren]: Secondary | ICD-10-CM | POA: Diagnosis not present

## 2015-07-19 DIAGNOSIS — I129 Hypertensive chronic kidney disease with stage 1 through stage 4 chronic kidney disease, or unspecified chronic kidney disease: Secondary | ICD-10-CM | POA: Diagnosis not present

## 2015-07-19 DIAGNOSIS — N189 Chronic kidney disease, unspecified: Secondary | ICD-10-CM | POA: Diagnosis not present

## 2015-07-19 DIAGNOSIS — I872 Venous insufficiency (chronic) (peripheral): Secondary | ICD-10-CM | POA: Diagnosis not present

## 2015-07-19 NOTE — Telephone Encounter (Signed)
Pt was on TCM list admitted on 3/13 for (L) total knee arthroplasty. D/C 07/17/15 and will f/u w/surgeon Dr. Alvan Dame in 2 wks...Bianca Haley

## 2015-07-20 DIAGNOSIS — N189 Chronic kidney disease, unspecified: Secondary | ICD-10-CM | POA: Diagnosis not present

## 2015-07-20 DIAGNOSIS — Z471 Aftercare following joint replacement surgery: Secondary | ICD-10-CM | POA: Diagnosis not present

## 2015-07-20 DIAGNOSIS — M720 Palmar fascial fibromatosis [Dupuytren]: Secondary | ICD-10-CM | POA: Diagnosis not present

## 2015-07-20 DIAGNOSIS — I872 Venous insufficiency (chronic) (peripheral): Secondary | ICD-10-CM | POA: Diagnosis not present

## 2015-07-20 DIAGNOSIS — G8929 Other chronic pain: Secondary | ICD-10-CM | POA: Diagnosis not present

## 2015-07-20 DIAGNOSIS — I129 Hypertensive chronic kidney disease with stage 1 through stage 4 chronic kidney disease, or unspecified chronic kidney disease: Secondary | ICD-10-CM | POA: Diagnosis not present

## 2015-07-22 DIAGNOSIS — I872 Venous insufficiency (chronic) (peripheral): Secondary | ICD-10-CM | POA: Diagnosis not present

## 2015-07-22 DIAGNOSIS — G8929 Other chronic pain: Secondary | ICD-10-CM | POA: Diagnosis not present

## 2015-07-22 DIAGNOSIS — M720 Palmar fascial fibromatosis [Dupuytren]: Secondary | ICD-10-CM | POA: Diagnosis not present

## 2015-07-22 DIAGNOSIS — N189 Chronic kidney disease, unspecified: Secondary | ICD-10-CM | POA: Diagnosis not present

## 2015-07-22 DIAGNOSIS — Z471 Aftercare following joint replacement surgery: Secondary | ICD-10-CM | POA: Diagnosis not present

## 2015-07-22 DIAGNOSIS — I129 Hypertensive chronic kidney disease with stage 1 through stage 4 chronic kidney disease, or unspecified chronic kidney disease: Secondary | ICD-10-CM | POA: Diagnosis not present

## 2015-07-22 MED FILL — Medication: Qty: 1 | Status: AC

## 2015-07-22 NOTE — Discharge Summary (Signed)
Physician Discharge Summary  Patient ID: Bianca Haley MRN: [id number] DOB/AGE: 10-08-1950 65 y.o.  Admit date: 07/15/2015 Discharge date: 07/18/2015   Procedures:  Procedure(s) (LRB): TOTAL LEFT KNEE ARTHROPLASTY (Left)  Attending Physician:  Dr. Paralee Cancel   Admission Diagnoses:   Left knee primary OA / pain  Discharge Diagnoses:  Principal Problem:   S/P left TKA Active Problems:   S/P knee replacement  Past Medical History  Diagnosis Date  . Hypertension   . Back pain   . Headache(784.0)   . Migraine   . Anemia   . Anxiety   . History of UTI   . History of endometriosis   . History of uterine fibroid   . H/O tinnitus     left  . GERD (gastroesophageal reflux disease)   . Recurrent pneumonia   . Chronic renal failure     patient denies   . Arthritis     HPI:    Bianca Haley, 65 y.o. female, has a history of pain and functional disability in the left knee due to arthritis and has failed non-surgical conservative treatments for greater than 12 weeks to includeNSAID's and/or analgesics, corticosteriod injections and activity modification. Onset of symptoms was gradual, starting 1+ years ago with gradually worsening course since that time. The patient noted no past surgery on the left knee(s). Patient currently rates pain in the left knee(s) at 7 out of 10 with activity. Patient has night pain, worsening of pain with activity and weight bearing, pain that interferes with activities of daily living, pain with passive range of motion, crepitus and joint swelling. Patient has evidence of periarticular osteophytes and joint space narrowing by imaging studies. There is no active infection. Risks, benefits and expectations were discussed with the patient. Risks including but not limited to the risk of anesthesia, blood clots, nerve damage, blood vessel damage, failure of the prosthesis, infection and up to and including death. Patient understand the risks, benefits  and expectations and wishes to proceed with surgery.   PCP: [name], MD   Discharged Condition: good  Hospital Course:  Patient underwent the above stated procedure on 07/15/2015. Patient tolerated the procedure well and brought to the recovery room in good condition and subsequently to the floor.  POD #1 BP: 90/49 ; Pulse: 81 ; Temp: 98.2 F (36.8 C) ; Resp: 16 Patient reports pain as mild to moderate. Stable over night. Notices some rattling in her chest.   Neurovascular intact and incision: dressing C/D/I  LABS  Basename    HGB     10.4  HCT     32.3   POD #2  BP: 113/38 ; Pulse: 80 ; Temp: 97.3 F (36.3 C) ; Resp: 17 Patient reports pain as mild, pain controlled. Decreased O2 Sat on room air and has been needing to use 1-2 lpm of O2 to maintain a O2 Sat in the 90%. Chest CT was negative for PE. Wil continue to monitor. Feeling is she is overly sedated. Have d/c'ed the Oxycodone and will have her maintain her usual dose of Opana and APAP if needing anything else. Dorsiflexion/plantar flexion intact, incision: dressing C/D/I, no cellulitis present and compartment soft.   LABS  Basename    HGB     8.6  HCT     26.3   POD #3  BP: 123/64 ; Pulse: 84 ; Temp: 97.8 F (36.6 C) ; Resp: 20 Patient reports pain as mild, pain controlled. Somewhat agitated yesterday evening, much  better today. She is still somewhat sleepy, but more aware this morning. Still needing O2. Will see how she does off of O2 today.  Doing well throughout the day and discharged in the afternoon. Dorsiflexion/plantar flexion intact, incision: dressing C/D/I, no cellulitis present and compartment soft.   LABS  Basename    HGB     9.2  HCT     29.1    Discharge Exam: General appearance: alert, cooperative and no distress Extremities: Homans sign is negative, no sign of DVT, no edema, redness or tenderness in the calves or thighs and no ulcers, gangrene or trophic changes  Disposition: Home with  follow up in 2 weeks   Follow-up Information    Follow up with Mauri Pole, MD. Schedule an appointment as soon as possible for a visit in 2 weeks.   Specialty:  Orthopedic Surgery   Contact information:   [address] [place] [address] [phone]       Follow up with [facility].   Why:  home health physical therapy   Contact information:   [address] [place] [address] [phone]       Discharge Instructions    Call MD / Call 911    Complete by:  As directed   If you experience chest pain or shortness of breath, CALL 911 and be transported to the hospital emergency room.  If you develope a fever above 101 F, pus (white drainage) or increased drainage or redness at the wound, or calf pain, call your surgeon's office.     Change dressing    Complete by:  As directed   Maintain surgical dressing until follow up in the clinic. If the edges start to pull up, may reinforce with tape. If the dressing is no longer working, may remove and cover with gauze and tape, but must keep the area dry and clean.  Call with any questions or concerns.     Constipation Prevention    Complete by:  As directed   Drink plenty of fluids.  Prune juice may be helpful.  You may use a stool softener, such as Colace (over the counter) 100 mg twice a day.  Use MiraLax (over the counter) for constipation as needed.     Diet - low sodium heart healthy    Complete by:  As directed      Discharge instructions    Complete by:  As directed   Maintain surgical dressing until follow up in the clinic. If the edges start to pull up, may reinforce with tape. If the dressing is no longer working, may remove and cover with gauze and tape, but must keep the area dry and clean.  Follow up in 2 weeks at [facility]. Call with any questions or concerns.     For home use only DME oxygen    Complete by:  As directed   For use pre, during and post therapy.  PT  to monitor O2 sat and report to Leonides Grills at [facility]  Mode or (Route):  Nasal cannula  Liters per Minute:  2  Frequency:  Continuous (stationary and portable oxygen unit needed)  Oxygen delivery system:  Gas     Increase activity slowly as tolerated    Complete by:  As directed   Weight bearing as tolerated with assist device (walker, cane, etc) as directed, use it as long as suggested by your surgeon or therapist, typically at least  4-6 weeks.     TED hose    Complete by:  As directed   Use stockings (TED hose) for 2 weeks on both leg(s).  You may remove them at night for sleeping.             Medication List    STOP taking these medications        diclofenac sodium 1 % Gel  Commonly known as:  VOLTAREN     meloxicam 15 MG tablet  Commonly known as:  MOBIC     simvastatin 40 MG tablet  Commonly known as:  ZOCOR      TAKE these medications        acetaminophen 325 MG tablet  Commonly known as:  TYLENOL  Take 1-2 tablets (325-650 mg total) by mouth every 6 (six) hours as needed for mild pain or moderate pain.     albuterol 108 (90 Base) MCG/ACT inhaler  Commonly known as:  PROVENTIL HFA;VENTOLIN HFA  2 puffs every 4-6 hours as needed for wheezing/shortness of breath     aspirin 325 MG EC tablet  Take 1 tablet (325 mg total) by mouth 2 (two) times daily.     atenolol 100 MG tablet  Commonly known as:  TENORMIN  TAKE 1 TABLET (100 MG TOTAL) BY MOUTH DAILY.     calcium carbonate 1250 (500 Ca) MG tablet  Commonly known as:  OS-CAL - dosed in mg of elemental calcium  Take 1 tablet by mouth daily with breakfast.     celecoxib 200 MG capsule  Commonly known as:  CELEBREX  Take 1 capsule (200 mg total) by mouth every 12 (twelve) hours.     cholecalciferol 1000 units tablet  Commonly known as:  VITAMIN D  Take 5,000 Units by mouth daily.     Dextromethorphan-Guaifenesin 10-100 MG/5ML liquid  Commonly known as:  ROBITUSSIN DM  Take 5 mLs by  mouth every 12 (twelve) hours as needed.     diazepam 5 MG tablet  Commonly known as:  VALIUM  Take 5 mg by mouth every 12 (twelve) hours as needed for anxiety or muscle spasms.     diltiazem 180 MG 24 hr capsule  Commonly known as:  CARDIZEM CD  TAKE 1 CAPSULE (180 MG TOTAL) BY MOUTH DAILY.     docusate sodium 100 MG capsule  Commonly known as:  COLACE  Take 1 capsule (100 mg total) by mouth 2 (two) times daily.     ferrous sulfate 325 (65 FE) MG tablet  Take 1 tablet (325 mg total) by mouth 3 (three) times daily after meals.     gabapentin 300 MG capsule  Commonly known as:  NEURONTIN  Take 1-3 capsules by mouth See admin instructions. Take 300 mg in the morning and 900 mg at bedtime. May take as needed throughout the day for nerve pain     hydrochlorothiazide 12.5 MG capsule  Commonly known as:  MICROZIDE  Take 1 capsule (12.5 mg total) by mouth daily.     oxyCODONE 5 MG immediate release tablet  Commonly known as:  Oxy IR/ROXICODONE  Take 1-2 tablets (5-10 mg total) by mouth every 4 (four) hours as needed for severe pain.     oxymorphone 10 MG tablet  Commonly known as:  OPANA  Take 10 mg by mouth See admin instructions. Takes 10mg  every morning and 10mg  every afternoon. She also takes 10mg  at bedtime if needed for pain.     pantoprazole 40 MG tablet  Commonly known as:  PROTONIX  TAKE 1 TABLET BY MOUTH 2 TIMES DAILY.     PARoxetine 40 MG tablet  Commonly known as:  PAXIL  TAKE 1 TABLET (40 MG TOTAL) BY MOUTH 2 (TWO) TIMES DAILY.     polyethylene glycol packet  Commonly known as:  MIRALAX / GLYCOLAX  Take 17 g by mouth 2 (two) times daily.     SUMAtriptan 100 MG tablet  Commonly known as:  IMITREX  Take 1 tablet (100 mg total) by mouth once. May repeat in 2 hours if headache persists or recurs.     tiZANidine 4 MG tablet  Commonly known as:  ZANAFLEX  Take 1 tablet (4 mg total) by mouth every 6 (six) hours as needed for muscle spasms.     traZODone 100 MG  tablet  Commonly known as:  DESYREL  Take 1 tablet (100 mg total) by mouth at bedtime.         Signed: West Pugh. Ladarrious Kirksey   PA-C  07/22/2015, 9:26 PM

## 2015-07-23 DIAGNOSIS — N189 Chronic kidney disease, unspecified: Secondary | ICD-10-CM | POA: Diagnosis not present

## 2015-07-23 DIAGNOSIS — I872 Venous insufficiency (chronic) (peripheral): Secondary | ICD-10-CM | POA: Diagnosis not present

## 2015-07-23 DIAGNOSIS — M720 Palmar fascial fibromatosis [Dupuytren]: Secondary | ICD-10-CM | POA: Diagnosis not present

## 2015-07-23 DIAGNOSIS — G8929 Other chronic pain: Secondary | ICD-10-CM | POA: Diagnosis not present

## 2015-07-23 DIAGNOSIS — Z471 Aftercare following joint replacement surgery: Secondary | ICD-10-CM | POA: Diagnosis not present

## 2015-07-23 DIAGNOSIS — I129 Hypertensive chronic kidney disease with stage 1 through stage 4 chronic kidney disease, or unspecified chronic kidney disease: Secondary | ICD-10-CM | POA: Diagnosis not present

## 2015-07-24 ENCOUNTER — Telehealth: Payer: Self-pay

## 2015-07-24 NOTE — Telephone Encounter (Signed)
Recd faxed rx refill request for simvastatin 40mg  tab from [facility] ([address])----i don't see this on current med list---are you ok with refilling?---please advise, thanks

## 2015-07-25 DIAGNOSIS — N189 Chronic kidney disease, unspecified: Secondary | ICD-10-CM | POA: Diagnosis not present

## 2015-07-25 DIAGNOSIS — I129 Hypertensive chronic kidney disease with stage 1 through stage 4 chronic kidney disease, or unspecified chronic kidney disease: Secondary | ICD-10-CM | POA: Diagnosis not present

## 2015-07-25 DIAGNOSIS — G8929 Other chronic pain: Secondary | ICD-10-CM | POA: Diagnosis not present

## 2015-07-25 DIAGNOSIS — M720 Palmar fascial fibromatosis [Dupuytren]: Secondary | ICD-10-CM | POA: Diagnosis not present

## 2015-07-25 DIAGNOSIS — I872 Venous insufficiency (chronic) (peripheral): Secondary | ICD-10-CM | POA: Diagnosis not present

## 2015-07-25 DIAGNOSIS — Z471 Aftercare following joint replacement surgery: Secondary | ICD-10-CM | POA: Diagnosis not present

## 2015-07-25 NOTE — Telephone Encounter (Signed)
Sch OV pls Thx

## 2015-07-25 NOTE — Telephone Encounter (Signed)
Did not send refill at this time---patient has schedule OV in May to address refill request

## 2015-07-29 DIAGNOSIS — M1712 Unilateral primary osteoarthritis, left knee: Secondary | ICD-10-CM | POA: Diagnosis not present

## 2015-07-31 DIAGNOSIS — Z471 Aftercare following joint replacement surgery: Secondary | ICD-10-CM | POA: Diagnosis not present

## 2015-07-31 DIAGNOSIS — Z96652 Presence of left artificial knee joint: Secondary | ICD-10-CM | POA: Diagnosis not present

## 2015-08-01 DIAGNOSIS — M1712 Unilateral primary osteoarthritis, left knee: Secondary | ICD-10-CM | POA: Diagnosis not present

## 2015-08-05 DIAGNOSIS — M1712 Unilateral primary osteoarthritis, left knee: Secondary | ICD-10-CM | POA: Diagnosis not present

## 2015-08-07 DIAGNOSIS — M1712 Unilateral primary osteoarthritis, left knee: Secondary | ICD-10-CM | POA: Diagnosis not present

## 2015-08-12 DIAGNOSIS — M1712 Unilateral primary osteoarthritis, left knee: Secondary | ICD-10-CM | POA: Diagnosis not present

## 2015-08-14 DIAGNOSIS — M1712 Unilateral primary osteoarthritis, left knee: Secondary | ICD-10-CM | POA: Diagnosis not present

## 2015-08-20 DIAGNOSIS — M1712 Unilateral primary osteoarthritis, left knee: Secondary | ICD-10-CM | POA: Diagnosis not present

## 2015-08-22 DIAGNOSIS — M1712 Unilateral primary osteoarthritis, left knee: Secondary | ICD-10-CM | POA: Diagnosis not present

## 2015-08-27 DIAGNOSIS — M1712 Unilateral primary osteoarthritis, left knee: Secondary | ICD-10-CM | POA: Diagnosis not present

## 2015-08-28 DIAGNOSIS — Z96652 Presence of left artificial knee joint: Secondary | ICD-10-CM | POA: Diagnosis not present

## 2015-08-28 DIAGNOSIS — Z471 Aftercare following joint replacement surgery: Secondary | ICD-10-CM | POA: Diagnosis not present

## 2015-09-03 DIAGNOSIS — M1712 Unilateral primary osteoarthritis, left knee: Secondary | ICD-10-CM | POA: Diagnosis not present

## 2015-09-09 DIAGNOSIS — Z4789 Encounter for other orthopedic aftercare: Secondary | ICD-10-CM | POA: Diagnosis not present

## 2015-09-10 DIAGNOSIS — M1712 Unilateral primary osteoarthritis, left knee: Secondary | ICD-10-CM | POA: Diagnosis not present

## 2015-09-12 DIAGNOSIS — M5416 Radiculopathy, lumbar region: Secondary | ICD-10-CM | POA: Diagnosis not present

## 2015-09-12 DIAGNOSIS — M542 Cervicalgia: Secondary | ICD-10-CM | POA: Diagnosis not present

## 2015-09-12 DIAGNOSIS — M25561 Pain in right knee: Secondary | ICD-10-CM | POA: Diagnosis not present

## 2015-09-12 DIAGNOSIS — M5136 Other intervertebral disc degeneration, lumbar region: Secondary | ICD-10-CM | POA: Diagnosis not present

## 2015-09-12 DIAGNOSIS — I10 Essential (primary) hypertension: Secondary | ICD-10-CM | POA: Diagnosis not present

## 2015-09-12 DIAGNOSIS — M5412 Radiculopathy, cervical region: Secondary | ICD-10-CM | POA: Diagnosis not present

## 2015-09-23 ENCOUNTER — Ambulatory Visit: Payer: Medicare Other | Admitting: Internal Medicine

## 2015-09-25 ENCOUNTER — Encounter: Payer: Self-pay | Admitting: Internal Medicine

## 2015-09-25 ENCOUNTER — Ambulatory Visit (INDEPENDENT_AMBULATORY_CARE_PROVIDER_SITE_OTHER): Payer: Medicare Other | Admitting: Internal Medicine

## 2015-09-25 VITALS — BP 182/80 | HR 57 | Wt 148.0 lb

## 2015-09-25 DIAGNOSIS — K2100 Gastro-esophageal reflux disease with esophagitis, without bleeding: Secondary | ICD-10-CM

## 2015-09-25 DIAGNOSIS — J189 Pneumonia, unspecified organism: Secondary | ICD-10-CM | POA: Diagnosis not present

## 2015-09-25 DIAGNOSIS — M1712 Unilateral primary osteoarthritis, left knee: Secondary | ICD-10-CM | POA: Diagnosis not present

## 2015-09-25 DIAGNOSIS — E559 Vitamin D deficiency, unspecified: Secondary | ICD-10-CM

## 2015-09-25 DIAGNOSIS — I10 Essential (primary) hypertension: Secondary | ICD-10-CM

## 2015-09-25 DIAGNOSIS — E785 Hyperlipidemia, unspecified: Secondary | ICD-10-CM | POA: Diagnosis not present

## 2015-09-25 DIAGNOSIS — R202 Paresthesia of skin: Secondary | ICD-10-CM

## 2015-09-25 MED ORDER — ROSUVASTATIN CALCIUM 10 MG PO TABS: 10.0000 mg | ORAL_TABLET | Freq: Every day | ORAL | Status: DC

## 2015-09-25 MED ORDER — PANTOPRAZOLE SODIUM 40 MG PO TBEC: 40.0000 mg | DELAYED_RELEASE_TABLET | Freq: Two times a day (BID) | ORAL | Status: DC

## 2015-09-25 NOTE — Progress Notes (Signed)
Subjective:  Patient ID: Bianca Haley, female    DOB: Nov 27, 1950  Age: 65 y.o. MRN: [id number]  CC: No chief complaint on file.   HPI Bianca Haley presents for a recurrent PNA related to GERD - sleeping w/her bed head-end elevated. F/u OA, dyslipidemia, insomnia Recovering from L TKR   Outpatient Prescriptions Prior to Visit  Medication Sig Dispense Refill  . acetaminophen (TYLENOL) 325 MG tablet Take 1-2 tablets (325-650 mg total) by mouth every 6 (six) hours as needed for mild pain or moderate pain.    [name] albuterol (PROVENTIL HFA;VENTOLIN HFA) 108 (90 BASE) MCG/ACT inhaler 2 puffs every 4-6 hours as needed for wheezing/shortness of breath 8 g 5  . atenolol (TENORMIN) 100 MG tablet TAKE 1 TABLET (100 MG TOTAL) BY MOUTH DAILY. 90 tablet 1  . calcium carbonate (OS-CAL - DOSED IN MG OF ELEMENTAL CALCIUM) 1250 (500 Ca) MG tablet Take 1 tablet by mouth daily with breakfast.    . celecoxib (CELEBREX) 200 MG capsule Take 1 capsule (200 mg total) by mouth every 12 (twelve) hours. 60 capsule 0  . cholecalciferol (VITAMIN D) 1000 units tablet Take 5,000 Units by mouth daily.    . diazepam (VALIUM) 5 MG tablet Take 5 mg by mouth every 12 (twelve) hours as needed for anxiety or muscle spasms.   2  . diltiazem (CARDIZEM CD) 180 MG 24 hr capsule TAKE 1 CAPSULE (180 MG TOTAL) BY MOUTH DAILY. 180 capsule 2  . gabapentin (NEURONTIN) 300 MG capsule Take 1-3 capsules by mouth See admin instructions. Take 300 mg in the morning and 900 mg at bedtime. May take as needed throughout the day for nerve pain    . oxyCODONE (OXY IR/ROXICODONE) 5 MG immediate release tablet Take 1-2 tablets (5-10 mg total) by mouth every 4 (four) hours as needed for severe pain. 100 tablet 0  . pantoprazole (PROTONIX) 40 MG tablet TAKE 1 TABLET BY MOUTH 2 TIMES DAILY. 60 tablet 11  . PARoxetine (PAXIL) 40 MG tablet TAKE 1 TABLET (40 MG TOTAL) BY MOUTH 2 (TWO) TIMES DAILY. 60 tablet 11  . polyethylene glycol (MIRALAX / GLYCOLAX)  packet Take 17 g by mouth 2 (two) times daily. 14 each 0  . SUMAtriptan (IMITREX) 100 MG tablet Take 1 tablet (100 mg total) by mouth once. May repeat in 2 hours if headache persists or recurs. 12 tablet 5  . traZODone (DESYREL) 100 MG tablet Take 1 tablet (100 mg total) by mouth at bedtime. 30 tablet 5  . Dextromethorphan-Guaifenesin (ROBITUSSIN DM) 10-100 MG/5ML liquid Take 5 mLs by mouth every 12 (twelve) hours as needed. (Patient not taking: Reported on 09/25/2015) 240 mL 0  . docusate sodium (COLACE) 100 MG capsule Take 1 capsule (100 mg total) by mouth 2 (two) times daily. (Patient not taking: Reported on 09/25/2015) 10 capsule 0  . ferrous sulfate 325 (65 FE) MG tablet Take 1 tablet (325 mg total) by mouth 3 (three) times daily after meals. (Patient not taking: Reported on 09/25/2015)  3  . hydrochlorothiazide (MICROZIDE) 12.5 MG capsule Take 1 capsule (12.5 mg total) by mouth daily. (Patient not taking: Reported on 09/25/2015) 90 capsule 0  . oxymorphone (OPANA) 10 MG tablet Take 10 mg by mouth See admin instructions. Reported on 09/25/2015    . tiZANidine (ZANAFLEX) 4 MG tablet Take 1 tablet (4 mg total) by mouth every 6 (six) hours as needed for muscle spasms. (Patient not taking: Reported on 09/25/2015) 40 tablet 0   No  facility-administered medications prior to visit.    ROS Review of Systems  Constitutional: Negative for chills, activity change, appetite change, fatigue and unexpected weight change.  HENT: Negative for congestion, mouth sores and sinus pressure.   Eyes: Negative for visual disturbance.  Respiratory: Negative for cough and chest tightness.   Gastrointestinal: Negative for nausea and abdominal pain.  Genitourinary: Negative for frequency, difficulty urinating and vaginal pain.  Musculoskeletal: Positive for back pain and arthralgias. Negative for gait problem.  Skin: Negative for pallor and rash.  Neurological: Negative for dizziness, tremors, weakness, numbness and  headaches.  Psychiatric/Behavioral: Negative for confusion and sleep disturbance.    Objective:  BP 182/80 mmHg  Pulse 57  Wt 148 lb (67.132 kg)  SpO2 95%  BP Readings from Last 3 Encounters:  09/25/15 182/80  07/18/15 119/52  07/04/15 144/63    Wt Readings from Last 3 Encounters:  09/25/15 148 lb (67.132 kg)  07/15/15 146 lb (66.225 kg)  07/04/15 146 lb (66.225 kg)    Physical Exam  Constitutional: She appears well-developed. No distress.  HENT:  Head: Normocephalic.  Right Ear: External ear normal.  Left Ear: External ear normal.  Nose: Nose normal.  Mouth/Throat: Oropharynx is clear and moist.  Eyes: Conjunctivae are normal. Pupils are equal, round, and reactive to light. Right eye exhibits no discharge. Left eye exhibits no discharge.  Neck: Normal range of motion. Neck supple. No JVD present. No tracheal deviation present. No thyromegaly present.  Cardiovascular: Normal rate, regular rhythm and normal heart sounds.   Pulmonary/Chest: No stridor. No respiratory distress. She has no wheezes.  Abdominal: Soft. Bowel sounds are normal. She exhibits no distension and no mass. There is no tenderness. There is no rebound and no guarding.  Musculoskeletal: She exhibits tenderness. She exhibits no edema.  Lymphadenopathy:    She has no cervical adenopathy.  Neurological: She displays normal reflexes. No cranial nerve deficit. She exhibits normal muscle tone. Coordination normal.  Skin: No rash noted. No erythema.  Psychiatric: She has a normal mood and affect. Her behavior is normal. Judgment and thought content normal.  LS tender L knee - post-op healing/swelling/scar  Lab Results  Component Value Date   WBC 11.4* 07/18/2015   HGB 9.2* 07/18/2015   HCT 29.1* 07/18/2015   PLT 187 07/18/2015   GLUCOSE 119* 07/18/2015   CHOL 196 10/31/2012   TRIG 82.0 10/31/2012   HDL 90.70 10/31/2012   LDLCALC 89 10/31/2012   ALT 10 09/01/2014   AST 17 09/01/2014   NA 143  07/18/2015   K 4.2 07/18/2015   CL 104 07/18/2015   CREATININE 0.61 07/18/2015   BUN 11 07/18/2015   CO2 31 07/18/2015   TSH 0.187* 11/23/2009   INR 0.98 07/04/2015    No results found.  Assessment & Plan:   There are no diagnoses linked to this encounter. I am having Ms. Colcord maintain her oxymorphone, gabapentin, albuterol, SUMAtriptan, diazepam, hydrochlorothiazide, PARoxetine, pantoprazole, diltiazem, Dextromethorphan-Guaifenesin, atenolol, traZODone, cholecalciferol, calcium carbonate, docusate sodium, ferrous sulfate, polyethylene glycol, tiZANidine, celecoxib, oxyCODONE, and acetaminophen.  No orders of the defined types were placed in this encounter.     Follow-up: No Follow-up on file.  [name], MD

## 2015-09-25 NOTE — Assessment & Plan Note (Signed)
Atenolol, Tiazac

## 2015-09-25 NOTE — Assessment & Plan Note (Signed)
Bed head elevation and Protonix bid 

## 2015-09-25 NOTE — Progress Notes (Signed)
Pre visit review using our clinic review tool, if applicable. No additional management support is needed unless otherwise documented below in the visit note. 

## 2015-09-25 NOTE — Assessment & Plan Note (Signed)
Crestor Labs 

## 2015-09-25 NOTE — Assessment & Plan Note (Signed)
Sedating meds + ?primary eso dysmotility?GERD; 2-17 - resolved w/head elevation

## 2015-09-26 ENCOUNTER — Other Ambulatory Visit (INDEPENDENT_AMBULATORY_CARE_PROVIDER_SITE_OTHER): Payer: Medicare Other

## 2015-09-26 DIAGNOSIS — E785 Hyperlipidemia, unspecified: Secondary | ICD-10-CM

## 2015-09-26 DIAGNOSIS — I10 Essential (primary) hypertension: Secondary | ICD-10-CM | POA: Diagnosis not present

## 2015-09-26 DIAGNOSIS — K2100 Gastro-esophageal reflux disease with esophagitis, without bleeding: Secondary | ICD-10-CM

## 2015-09-26 DIAGNOSIS — R202 Paresthesia of skin: Secondary | ICD-10-CM | POA: Diagnosis not present

## 2015-09-26 DIAGNOSIS — E559 Vitamin D deficiency, unspecified: Secondary | ICD-10-CM | POA: Diagnosis not present

## 2015-09-26 DIAGNOSIS — J189 Pneumonia, unspecified organism: Secondary | ICD-10-CM | POA: Diagnosis not present

## 2015-09-26 LAB — CBC WITH DIFFERENTIAL/PLATELET
BASOS PCT: 0.7 % (ref 0.0–3.0)
Basophils Absolute: 0 10*3/uL (ref 0.0–0.1)
EOS PCT: 4.3 % (ref 0.0–5.0)
Eosinophils Absolute: 0.2 10*3/uL (ref 0.0–0.7)
HCT: 37.6 % (ref 36.0–46.0)
HEMOGLOBIN: 12.4 g/dL (ref 12.0–15.0)
LYMPHS ABS: 2.1 10*3/uL (ref 0.7–4.0)
Lymphocytes Relative: 43.5 % (ref 12.0–46.0)
MCHC: 32.9 g/dL (ref 30.0–36.0)
MCV: 76.3 fl — ABNORMAL LOW (ref 78.0–100.0)
MONO ABS: 0.2 10*3/uL (ref 0.1–1.0)
Monocytes Relative: 4.8 % (ref 3.0–12.0)
NEUTROS ABS: 2.3 10*3/uL (ref 1.4–7.7)
NEUTROS PCT: 46.7 % (ref 43.0–77.0)
PLATELETS: 291 10*3/uL (ref 150.0–400.0)
RBC: 4.93 Mil/uL (ref 3.87–5.11)
RDW: 14.7 % (ref 11.5–15.5)
WBC: 4.9 10*3/uL (ref 4.0–10.5)

## 2015-09-26 LAB — LIPID PANEL
CHOL/HDL RATIO: 4
Cholesterol: 262 mg/dL — ABNORMAL HIGH (ref 0–200)
HDL: 61.6 mg/dL (ref >39.00)
LDL CALC: 186 mg/dL — AB (ref 0–99)
NONHDL: 200.4
TRIGLYCERIDES: 71 mg/dL (ref 0.0–149.0)
VLDL: 14.2 mg/dL (ref 0.0–40.0)

## 2015-09-26 LAB — BASIC METABOLIC PANEL
BUN: 17 mg/dL (ref 6–23)
CHLORIDE: 105 meq/L (ref 96–112)
CO2: 34 meq/L — AB (ref 19–32)
CREATININE: 0.88 mg/dL (ref 0.40–1.20)
Calcium: 9.6 mg/dL (ref 8.4–10.5)
GFR: 68.1 mL/min (ref >60.00)
GLUCOSE: 103 mg/dL — AB (ref 70–99)
POTASSIUM: 4.2 meq/L (ref 3.5–5.1)
Sodium: 142 mEq/L (ref 135–145)

## 2015-09-26 LAB — VITAMIN B12: Vitamin B-12: 391 pg/mL (ref 211–911)

## 2015-09-26 LAB — HEPATIC FUNCTION PANEL
ALBUMIN: 4.4 g/dL (ref 3.5–5.2)
ALT: 7 U/L (ref 0–35)
AST: 16 U/L (ref 0–37)
Alkaline Phosphatase: 108 U/L (ref 39–117)
BILIRUBIN DIRECT: 0.1 mg/dL (ref 0.0–0.3)
TOTAL PROTEIN: 7.1 g/dL (ref 6.0–8.3)
Total Bilirubin: 0.4 mg/dL (ref 0.2–1.2)

## 2015-09-26 LAB — TSH: TSH: 1.56 u[IU]/mL (ref 0.35–4.50)

## 2015-09-26 LAB — VITAMIN D 25 HYDROXY (VIT D DEFICIENCY, FRACTURES): VITD: 51.84 ng/mL (ref 30.00–100.00)

## 2015-09-27 LAB — HEPATITIS C ANTIBODY: HCV AB: NEGATIVE

## 2015-10-10 DIAGNOSIS — Z471 Aftercare following joint replacement surgery: Secondary | ICD-10-CM | POA: Diagnosis not present

## 2015-10-10 DIAGNOSIS — Z96652 Presence of left artificial knee joint: Secondary | ICD-10-CM | POA: Diagnosis not present

## 2015-11-12 ENCOUNTER — Other Ambulatory Visit: Payer: Self-pay | Admitting: Internal Medicine

## 2015-11-17 ENCOUNTER — Other Ambulatory Visit: Payer: Self-pay | Admitting: Internal Medicine

## 2015-11-26 ENCOUNTER — Other Ambulatory Visit: Payer: Self-pay | Admitting: *Deleted

## 2015-11-26 MED ORDER — DILTIAZEM HCL ER COATED BEADS 180 MG PO CP24: ORAL_CAPSULE | ORAL | 2 refills | Status: DC

## 2015-11-28 ENCOUNTER — Other Ambulatory Visit: Payer: Self-pay | Admitting: Internal Medicine

## 2015-12-02 ENCOUNTER — Telehealth: Payer: Self-pay | Admitting: Pulmonary Disease

## 2015-12-02 MED ORDER — AZITHROMYCIN 250 MG PO TABS: 250.0000 mg | ORAL_TABLET | ORAL | 0 refills | Status: DC

## 2015-12-02 NOTE — Telephone Encounter (Signed)
Spoke with pt. States that she has caught a cold from her granddaughter. Reports coughing, wheezing, chest tightness and SOB. Cough is non productive. Has not been taking any OTC medications. Refused appointment today at 11am. Would like to have something called.  RA - please advise. Thanks.

## 2015-12-02 NOTE — Telephone Encounter (Signed)
Z pak

## 2015-12-04 ENCOUNTER — Telehealth: Payer: Self-pay | Admitting: Pulmonary Disease

## 2015-12-04 ENCOUNTER — Ambulatory Visit (INDEPENDENT_AMBULATORY_CARE_PROVIDER_SITE_OTHER)
Admission: RE | Admit: 2015-12-04 | Discharge: 2015-12-04 | Disposition: A | Discharge status: Home / Self Care | Payer: Medicare Other | Source: Ambulatory Visit | Attending: Pulmonary Disease | Admitting: Pulmonary Disease

## 2015-12-04 DIAGNOSIS — J189 Pneumonia, unspecified organism: Secondary | ICD-10-CM | POA: Diagnosis not present

## 2015-12-04 DIAGNOSIS — R05 Cough: Secondary | ICD-10-CM | POA: Diagnosis not present

## 2015-12-04 NOTE — Telephone Encounter (Signed)
Notes Recorded by Rigoberto Noel, MD on 12/04/2015 at 3:14 PM EDT CXR is clear Over-the-counter cough syrup okay -Delsym Call us if worse ------------------  Spoke with pt, aware of results/recs.  Nothing further needed.

## 2015-12-04 NOTE — Telephone Encounter (Signed)
Chest x-ray today We'll give more antibiotics if pneumonia is seen

## 2015-12-04 NOTE — Telephone Encounter (Signed)
Spoke with pt, states she believes she has PNA.  Pt was given a zpak on 12/02/15 and is feeling no better on this medication.  Pt's prod cough with clear mucus, chest tightness, SOB is worsening.  Denies fever, chest pain.  Pt also notes that her granddaughter has recently been ill with a fever and respiratory infection- granddaughter was treated with abx through pediatrician.  Pt uses [facility] on [address].    RA please advise on recs.  Thanks!

## 2015-12-04 NOTE — Telephone Encounter (Signed)
Pt aware of need for CXR. Pt voices understanding. Nothing further needed.

## 2015-12-08 DIAGNOSIS — J0100 Acute maxillary sinusitis, unspecified: Secondary | ICD-10-CM | POA: Diagnosis not present

## 2015-12-08 DIAGNOSIS — R05 Cough: Secondary | ICD-10-CM | POA: Diagnosis not present

## 2015-12-10 DIAGNOSIS — M5136 Other intervertebral disc degeneration, lumbar region: Secondary | ICD-10-CM | POA: Diagnosis not present

## 2015-12-10 DIAGNOSIS — M5412 Radiculopathy, cervical region: Secondary | ICD-10-CM | POA: Diagnosis not present

## 2015-12-10 DIAGNOSIS — M5416 Radiculopathy, lumbar region: Secondary | ICD-10-CM | POA: Diagnosis not present

## 2015-12-10 DIAGNOSIS — M542 Cervicalgia: Secondary | ICD-10-CM | POA: Diagnosis not present

## 2015-12-10 DIAGNOSIS — M25561 Pain in right knee: Secondary | ICD-10-CM | POA: Diagnosis not present

## 2015-12-17 DIAGNOSIS — D1801 Hemangioma of skin and subcutaneous tissue: Secondary | ICD-10-CM | POA: Diagnosis not present

## 2015-12-17 DIAGNOSIS — D225 Melanocytic nevi of trunk: Secondary | ICD-10-CM | POA: Diagnosis not present

## 2015-12-17 DIAGNOSIS — L720 Epidermal cyst: Secondary | ICD-10-CM | POA: Diagnosis not present

## 2015-12-17 DIAGNOSIS — D2271 Melanocytic nevi of right lower limb, including hip: Secondary | ICD-10-CM | POA: Diagnosis not present

## 2015-12-17 DIAGNOSIS — D235 Other benign neoplasm of skin of trunk: Secondary | ICD-10-CM | POA: Diagnosis not present

## 2015-12-17 DIAGNOSIS — Z85828 Personal history of other malignant neoplasm of skin: Secondary | ICD-10-CM | POA: Diagnosis not present

## 2015-12-17 DIAGNOSIS — L570 Actinic keratosis: Secondary | ICD-10-CM | POA: Diagnosis not present

## 2015-12-17 DIAGNOSIS — L2089 Other atopic dermatitis: Secondary | ICD-10-CM | POA: Diagnosis not present

## 2015-12-17 DIAGNOSIS — L821 Other seborrheic keratosis: Secondary | ICD-10-CM | POA: Diagnosis not present

## 2016-02-05 DIAGNOSIS — M2042 Other hammer toe(s) (acquired), left foot: Secondary | ICD-10-CM | POA: Diagnosis not present

## 2016-02-05 DIAGNOSIS — M19072 Primary osteoarthritis, left ankle and foot: Secondary | ICD-10-CM | POA: Diagnosis not present

## 2016-02-05 DIAGNOSIS — M21612 Bunion of left foot: Secondary | ICD-10-CM | POA: Diagnosis not present

## 2016-02-12 ENCOUNTER — Other Ambulatory Visit: Payer: Self-pay | Admitting: *Deleted

## 2016-02-12 MED ORDER — SUMATRIPTAN SUCCINATE 100 MG PO TABS: 100.0000 mg | ORAL_TABLET | Freq: Once | ORAL | 2 refills | Status: DC

## 2016-02-20 DIAGNOSIS — M5416 Radiculopathy, lumbar region: Secondary | ICD-10-CM | POA: Diagnosis not present

## 2016-02-20 DIAGNOSIS — I10 Essential (primary) hypertension: Secondary | ICD-10-CM | POA: Diagnosis not present

## 2016-02-21 ENCOUNTER — Ambulatory Visit (INDEPENDENT_AMBULATORY_CARE_PROVIDER_SITE_OTHER): Payer: Medicare Other

## 2016-02-21 DIAGNOSIS — Z23 Encounter for immunization: Secondary | ICD-10-CM

## 2016-02-25 ENCOUNTER — Other Ambulatory Visit: Payer: Self-pay | Admitting: *Deleted

## 2016-02-25 MED ORDER — TRAZODONE HCL 100 MG PO TABS: 100.0000 mg | ORAL_TABLET | Freq: Every day | ORAL | 2 refills | Status: DC

## 2016-02-28 DIAGNOSIS — M21612 Bunion of left foot: Secondary | ICD-10-CM | POA: Diagnosis not present

## 2016-02-28 DIAGNOSIS — M19072 Primary osteoarthritis, left ankle and foot: Secondary | ICD-10-CM | POA: Diagnosis not present

## 2016-03-10 DIAGNOSIS — Z01419 Encounter for gynecological examination (general) (routine) without abnormal findings: Secondary | ICD-10-CM | POA: Diagnosis not present

## 2016-03-10 DIAGNOSIS — Z6824 Body mass index (BMI) 24.0-24.9, adult: Secondary | ICD-10-CM | POA: Diagnosis not present

## 2016-03-10 DIAGNOSIS — D649 Anemia, unspecified: Secondary | ICD-10-CM | POA: Diagnosis not present

## 2016-03-10 DIAGNOSIS — M8580 Other specified disorders of bone density and structure, unspecified site: Secondary | ICD-10-CM | POA: Diagnosis not present

## 2016-03-10 DIAGNOSIS — Z780 Asymptomatic menopausal state: Secondary | ICD-10-CM | POA: Diagnosis not present

## 2016-03-10 DIAGNOSIS — Z1389 Encounter for screening for other disorder: Secondary | ICD-10-CM | POA: Diagnosis not present

## 2016-03-10 DIAGNOSIS — Z1231 Encounter for screening mammogram for malignant neoplasm of breast: Secondary | ICD-10-CM | POA: Diagnosis not present

## 2016-03-10 DIAGNOSIS — Z130 Encounter for screening for diseases of the blood and blood-forming organs and certain disorders involving the immune mechanism: Secondary | ICD-10-CM | POA: Diagnosis not present

## 2016-03-10 DIAGNOSIS — N9089 Other specified noninflammatory disorders of vulva and perineum: Secondary | ICD-10-CM | POA: Diagnosis not present

## 2016-03-11 DIAGNOSIS — M5412 Radiculopathy, cervical region: Secondary | ICD-10-CM | POA: Diagnosis not present

## 2016-03-11 DIAGNOSIS — M542 Cervicalgia: Secondary | ICD-10-CM | POA: Diagnosis not present

## 2016-03-11 DIAGNOSIS — M5416 Radiculopathy, lumbar region: Secondary | ICD-10-CM | POA: Diagnosis not present

## 2016-03-11 DIAGNOSIS — I10 Essential (primary) hypertension: Secondary | ICD-10-CM | POA: Diagnosis not present

## 2016-03-17 ENCOUNTER — Other Ambulatory Visit: Payer: Self-pay | Admitting: Gynecology

## 2016-03-17 DIAGNOSIS — M8580 Other specified disorders of bone density and structure, unspecified site: Secondary | ICD-10-CM

## 2016-03-31 ENCOUNTER — Telehealth: Payer: Self-pay | Admitting: Internal Medicine

## 2016-03-31 NOTE — Telephone Encounter (Signed)
Left detailed mess informing pt of below.  

## 2016-03-31 NOTE — Telephone Encounter (Signed)
Patient states she was in with Hardin Negus last week, who was seeing Dr. Alain Marion.  States she also talked to Dr. Camila Li about a cold she had.  Patient states she still has cough.  States it keeps her up all night and is requesting cough syrup to help her sleep at night so she can get rest for work the next day.

## 2016-03-31 NOTE — Telephone Encounter (Signed)
Pls use OTC Robutussin Thx

## 2016-04-13 DIAGNOSIS — M2142 Flat foot [pes planus] (acquired), left foot: Secondary | ICD-10-CM | POA: Diagnosis not present

## 2016-04-13 DIAGNOSIS — M79672 Pain in left foot: Secondary | ICD-10-CM | POA: Diagnosis not present

## 2016-04-13 DIAGNOSIS — M19072 Primary osteoarthritis, left ankle and foot: Secondary | ICD-10-CM | POA: Diagnosis not present

## 2016-04-13 DIAGNOSIS — Q6621 Congenital metatarsus primus varus: Secondary | ICD-10-CM | POA: Diagnosis not present

## 2016-04-13 DIAGNOSIS — M2012 Hallux valgus (acquired), left foot: Secondary | ICD-10-CM | POA: Diagnosis not present

## 2016-04-13 DIAGNOSIS — M76822 Posterior tibial tendinitis, left leg: Secondary | ICD-10-CM | POA: Insufficient documentation

## 2016-04-13 DIAGNOSIS — M25572 Pain in left ankle and joints of left foot: Secondary | ICD-10-CM | POA: Diagnosis not present

## 2016-05-18 ENCOUNTER — Encounter: Payer: Self-pay | Admitting: Internal Medicine

## 2016-05-18 DIAGNOSIS — Z961 Presence of intraocular lens: Secondary | ICD-10-CM | POA: Diagnosis not present

## 2016-05-18 DIAGNOSIS — H35371 Puckering of macula, right eye: Secondary | ICD-10-CM | POA: Diagnosis not present

## 2016-05-18 DIAGNOSIS — H35362 Drusen (degenerative) of macula, left eye: Secondary | ICD-10-CM | POA: Diagnosis not present

## 2016-05-18 DIAGNOSIS — H35341 Macular cyst, hole, or pseudohole, right eye: Secondary | ICD-10-CM | POA: Diagnosis not present

## 2016-05-22 ENCOUNTER — Other Ambulatory Visit: Payer: Self-pay | Admitting: *Deleted

## 2016-05-22 NOTE — Telephone Encounter (Signed)
Ok to Rf Trazodone 100 mg?

## 2016-05-24 MED ORDER — TRAZODONE HCL 100 MG PO TABS: 100.0000 mg | ORAL_TABLET | Freq: Every day | ORAL | 5 refills | Status: DC

## 2016-06-01 DIAGNOSIS — M21622 Bunionette of left foot: Secondary | ICD-10-CM | POA: Diagnosis not present

## 2016-06-04 ENCOUNTER — Telehealth: Payer: Self-pay

## 2016-06-04 NOTE — Telephone Encounter (Signed)
[facility] [address] sent rf rq for paroxetine 40 mg tablets.   Please advise.

## 2016-06-04 NOTE — Telephone Encounter (Signed)
OK to fill this/these prescription(s) with additional refills x5 Needs to have an OV every 6 months Thank you!  

## 2016-06-05 NOTE — Telephone Encounter (Signed)
LVM for pt to call back as soon as possible.   RE: pt needs an appt.  

## 2016-06-08 ENCOUNTER — Encounter (INDEPENDENT_AMBULATORY_CARE_PROVIDER_SITE_OTHER): Payer: Medicare Other | Admitting: Ophthalmology

## 2016-06-11 ENCOUNTER — Encounter: Payer: Self-pay | Admitting: Internal Medicine

## 2016-06-11 ENCOUNTER — Ambulatory Visit (INDEPENDENT_AMBULATORY_CARE_PROVIDER_SITE_OTHER): Payer: Medicare Other | Admitting: Internal Medicine

## 2016-06-11 DIAGNOSIS — I10 Essential (primary) hypertension: Secondary | ICD-10-CM | POA: Diagnosis not present

## 2016-06-11 DIAGNOSIS — H6120 Impacted cerumen, unspecified ear: Secondary | ICD-10-CM | POA: Insufficient documentation

## 2016-06-11 DIAGNOSIS — H6123 Impacted cerumen, bilateral: Secondary | ICD-10-CM | POA: Diagnosis not present

## 2016-06-11 MED ORDER — NEOMYCIN-POLYMYXIN-HC 3.5-10000-1 OT SOLN: 3.0000 [drp] | Freq: Three times a day (TID) | OTIC | 3 refills | Status: AC

## 2016-06-11 NOTE — Assessment & Plan Note (Addendum)
Will irrigate Cortisporin otic B x 3-4 days

## 2016-06-11 NOTE — Progress Notes (Signed)
Subjective:  Patient ID: Bianca Haley, female    DOB: 10-Jul-1950  Age: 66 y.o. MRN: [id number]  CC: No chief complaint on file.   HPI Bianca Haley presents for B ears being clogged off and on x weeks F/u dyslipidemia, LBP  Outpatient Medications Prior to Visit  Medication Sig Dispense Refill  . acetaminophen (TYLENOL) 325 MG tablet Take 1-2 tablets (325-650 mg total) by mouth every 6 (six) hours as needed for mild pain or moderate pain.    [name] albuterol (PROVENTIL HFA;VENTOLIN HFA) 108 (90 BASE) MCG/ACT inhaler 2 puffs every 4-6 hours as needed for wheezing/shortness of breath 8 g 5  . atenolol (TENORMIN) 100 MG tablet TAKE 1 TABLET BY MOUTH EVERY DAY 90 tablet 3  . azithromycin (ZITHROMAX) 250 MG tablet Take 1 tablet (250 mg total) by mouth as directed. 6 tablet 0  . calcium carbonate (OS-CAL - DOSED IN MG OF ELEMENTAL CALCIUM) 1250 (500 Ca) MG tablet Take 1 tablet by mouth daily with breakfast.    . celecoxib (CELEBREX) 200 MG capsule Take 1 capsule (200 mg total) by mouth every 12 (twelve) hours. 60 capsule 0  . cholecalciferol (VITAMIN D) 1000 units tablet Take 5,000 Units by mouth daily.    [name] Dextromethorphan-Guaifenesin (ROBITUSSIN DM) 10-100 MG/5ML liquid Take 5 mLs by mouth every 12 (twelve) hours as needed. 240 mL 0  . diazepam (VALIUM) 5 MG tablet Take 5 mg by mouth every 12 (twelve) hours as needed for anxiety or muscle spasms.   2  . diltiazem (CARDIZEM CD) 180 MG 24 hr capsule TAKE 1 CAPSULE (180 MG TOTAL) BY MOUTH DAILY. 180 capsule 2  . docusate sodium (COLACE) 100 MG capsule Take 1 capsule (100 mg total) by mouth 2 (two) times daily. 10 capsule 0  . ferrous sulfate 325 (65 FE) MG tablet Take 1 tablet (325 mg total) by mouth 3 (three) times daily after meals.  3  . gabapentin (NEURONTIN) 300 MG capsule Take 1-3 capsules by mouth See admin instructions. Take 300 mg in the morning and 900 mg at bedtime. May take as needed throughout the day for nerve pain    .  hydrochlorothiazide (MICROZIDE) 12.5 MG capsule Take 1 capsule (12.5 mg total) by mouth daily. 90 capsule 0  . oxyCODONE (OXY IR/ROXICODONE) 5 MG immediate release tablet Take 1-2 tablets (5-10 mg total) by mouth every 4 (four) hours as needed for severe pain. 100 tablet 0  . oxymorphone (OPANA) 10 MG tablet Take 10 mg by mouth See admin instructions. Reported on 09/25/2015    . pantoprazole (PROTONIX) 40 MG tablet Take 1 tablet (40 mg total) by mouth 2 (two) times daily before a meal. 180 tablet 3  . PARoxetine (PAXIL) 40 MG tablet TAKE 1 TABLET BY MOUTH TWICE DAILY 60 tablet 5  . polyethylene glycol (MIRALAX / GLYCOLAX) packet Take 17 g by mouth 2 (two) times daily. 14 each 0  . rosuvastatin (CRESTOR) 10 MG tablet Take 1 tablet (10 mg total) by mouth daily. 90 tablet 3  . tiZANidine (ZANAFLEX) 4 MG tablet Take 1 tablet (4 mg total) by mouth every 6 (six) hours as needed for muscle spasms. 40 tablet 0  . traZODone (DESYREL) 100 MG tablet Take 1 tablet (100 mg total) by mouth at bedtime. 30 tablet 5  . SUMAtriptan (IMITREX) 100 MG tablet Take 1 tablet (100 mg total) by mouth once. May repeat in 2 hours if headache persists or recurs. 12 tablet 2   No  facility-administered medications prior to visit.     ROS Review of Systems  Constitutional: Negative for fever.  HENT: Positive for hearing loss. Negative for ear pain.   Neurological: Negative for dizziness and light-headedness.    Objective:  BP 140/78   Pulse 70   Temp 98.1 F (36.7 C) (Oral)   Resp 20   Wt 157 lb (71.2 kg)   SpO2 93%   BMI 25.73 kg/m   BP Readings from Last 3 Encounters:  06/11/16 140/78  09/25/15 (!) 182/80  07/18/15 (!) 119/52    Wt Readings from Last 3 Encounters:  06/11/16 157 lb (71.2 kg)  09/25/15 148 lb (67.1 kg)  07/15/15 146 lb (66.2 kg)    Physical Exam  Constitutional: She appears well-developed.  HENT:  Nose: Nose normal.  Mouth/Throat: No oropharyngeal exudate.  Eyes: Conjunctivae are  normal. Right eye exhibits no discharge.  Neck: No thyromegaly present.  Musculoskeletal: She exhibits tenderness.  Neurological: She displays normal reflexes. No cranial nerve deficit. Coordination normal.  Psychiatric: Her behavior is normal.   B wax   Procedure Note :     Procedure :  Ear irrigation B   Indication:  Cerumen impaction B   Risks, including pain, dizziness, eardrum perforation, bleeding, infection and others as well as benefits were explained to the patient in detail. Verbal consent was obtained and the patient agreed to proceed.    We used "The Elephant Ear Irrigation Device" filled with lukewarm water for irrigation. A large amount wax was recovered. Procedure has also required manual wax removal with an ear loop.   Tolerated well. Complications: pain.   Postprocedure instructions :  Call if problems.   Lab Results  Component Value Date   WBC 4.9 09/26/2015   HGB 12.4 09/26/2015   HCT 37.6 09/26/2015   PLT 291.0 09/26/2015   GLUCOSE 103 (H) 09/26/2015   CHOL 262 (H) 09/26/2015   TRIG 71.0 09/26/2015   HDL 61.60 09/26/2015   LDLCALC 186 (H) 09/26/2015   ALT 7 09/26/2015   AST 16 09/26/2015   NA 142 09/26/2015   K 4.2 09/26/2015   CL 105 09/26/2015   CREATININE 0.88 09/26/2015   BUN 17 09/26/2015   CO2 34 (H) 09/26/2015   TSH 1.56 09/26/2015   INR 0.98 07/04/2015    Dg Chest 2 View  Result Date: 12/04/2015 CLINICAL DATA:  Cough, wheezing EXAM: CHEST  2 VIEW COMPARISON:  CT chest of 07/16/2015 and chest x-ray of the same day FINDINGS: No definite active infiltrate or effusion is seen. Minimal opacity at the left lung base may be residual of the prior more consolidated opacity noted by CT chest on 07/16/2015. No pleural effusion is seen. Mediastinal and hilar contours are unremarkable. The heart is within normal limits in size. Slight thoracic curvature convex to the right is noted. IMPRESSION: No definite active process. Probable minimal scarring at the  left base as noted above. Electronically Signed   By: Ivar Drape M.D.   On: 12/04/2015 13:47    Assessment & Plan:   There are no diagnoses linked to this encounter. I am having Bianca Haley maintain her oxymorphone, gabapentin, albuterol, diazepam, hydrochlorothiazide, Dextromethorphan-Guaifenesin, cholecalciferol, calcium carbonate, docusate sodium, ferrous sulfate, polyethylene glycol, tiZANidine, celecoxib, oxyCODONE, acetaminophen, pantoprazole, rosuvastatin, atenolol, PARoxetine, diltiazem, azithromycin, SUMAtriptan, and traZODone.  No orders of the defined types were placed in this encounter.    Follow-up: No Follow-up on file.  [name], MD

## 2016-06-11 NOTE — Assessment & Plan Note (Signed)
BP Readings from Last 3 Encounters:  06/11/16 140/78  09/25/15 (!) 182/80  07/18/15 (!) 119/52

## 2016-06-11 NOTE — Progress Notes (Signed)
Pre visit review using our clinic review tool, if applicable. No additional management support is needed unless otherwise documented below in the visit note. 

## 2016-06-15 DIAGNOSIS — M5416 Radiculopathy, lumbar region: Secondary | ICD-10-CM | POA: Diagnosis not present

## 2016-06-15 DIAGNOSIS — I10 Essential (primary) hypertension: Secondary | ICD-10-CM | POA: Diagnosis not present

## 2016-06-15 DIAGNOSIS — M5412 Radiculopathy, cervical region: Secondary | ICD-10-CM | POA: Diagnosis not present

## 2016-06-17 ENCOUNTER — Other Ambulatory Visit: Payer: Self-pay | Admitting: *Deleted

## 2016-06-17 MED ORDER — PAROXETINE HCL 40 MG PO TABS: 40.0000 mg | ORAL_TABLET | Freq: Two times a day (BID) | ORAL | 0 refills | Status: DC

## 2016-06-19 ENCOUNTER — Ambulatory Visit (INDEPENDENT_AMBULATORY_CARE_PROVIDER_SITE_OTHER): Payer: Medicare Other | Admitting: Pulmonary Disease

## 2016-06-19 ENCOUNTER — Telehealth: Payer: Self-pay | Admitting: Pulmonary Disease

## 2016-06-19 ENCOUNTER — Encounter: Payer: Self-pay | Admitting: Pulmonary Disease

## 2016-06-19 DIAGNOSIS — K219 Gastro-esophageal reflux disease without esophagitis: Secondary | ICD-10-CM | POA: Diagnosis not present

## 2016-06-19 DIAGNOSIS — R053 Chronic cough: Secondary | ICD-10-CM

## 2016-06-19 DIAGNOSIS — R05 Cough: Secondary | ICD-10-CM | POA: Diagnosis not present

## 2016-06-19 MED ORDER — PROMETHAZINE-CODEINE 6.25-10 MG/5ML PO SYRP: 5.0000 mL | ORAL_SOLUTION | Freq: Four times a day (QID) | ORAL | 0 refills | Status: DC | PRN

## 2016-06-19 NOTE — Addendum Note (Signed)
Addended by: Valerie Salts on: 06/19/2016 02:15 PM   Modules accepted: Orders

## 2016-06-19 NOTE — Assessment & Plan Note (Signed)
Continue Protonix °

## 2016-06-19 NOTE — Progress Notes (Signed)
   Subjective:    Patient ID: Bianca Haley, female    DOB: [birth year], 66 y.o.   MRN: [id number]  HPI  65/F never smoker with recurrent pneumonia attributed to aspiration ? due to narcotics & esophageal dysmotility  -first seen for chronic cough x 40mnths on 02/21/13  Works parttime @ [employer]  06/19/2016  Chief Complaint  Patient presents with  . Cough    Cough started after having left ear cleaned. Non-productive cough. Has taken Delsym and its not helping.    She has chronic back pain and remains on opana which she takes occasionally, trazodone for sleep and Neurontin. Overall she has cut down on her sedating medications-and has done well over the past year without any pneumonia  She reports that about a week ago she had her left ear cleaned out due to wax and then developed a postnasal drip with a dry cough that has only gotten worse for the past week. She has been unable to sleep, Delsym over-the-counter has not helped She is due for a new replacement surgery first week of March Denies sputum production, occasional wheeze but has not really required albuterol, No fevers or sick contacts  She remains on Protonix for reflux and sleeps sitting up  Significant tests/ events  02/2013 CT chest showed RML/RLL pneumonia and what appeared to be some progression of bronchiectasis (first noted 2011)   Esophagram was negative. IgG,IgaA and IgM levels were low, but not immunosuppressed.   09/2013 admitted for recurrent PNA /aspiration pneumonitis . CXR showed R>L consolidation c/w multifocal PNA.  MBS this admit without dysphagia but raised concerns of possible primary esophageal dysmotility as barium pill stopped mid esophagus requiring additional thin barium to facilitate transit to stomach , PPI to BID prior to discharge.     04/2014 EGD showed mild esophagitis.   The patient was visiting family out of state in [place], [place], where she was admitted for severe  pneumonia, required vent support.  CT chest showed no pulmonary embolism. + dense bilateral, lower lobe consolidations, especially along the right middle lobe and left upper lobe. She had mediastinal lymphadenopathy. A 2-D echo showed a normal left ventricular systolic function and ejection fracture 67%. Pulmonary artery pressures were elevated at 43.   08/2014 admitted for pneumonia CT chest 12/2014 resolution of infiltrates and inflammation. There are some persistent scattered sub 4 mm pulmonary nodules and areas of bronchiectasis. Persistent but slightly decreased mediastinal lymphadenopathy.   Review of Systems Patient denies significant dyspnea,cough, hemoptysis,  chest pain, palpitations, pedal edema, orthopnea, paroxysmal nocturnal dyspnea, lightheadedness, nausea, vomiting, abdominal or  leg pains      Objective:   Physical Exam  Gen. Pleasant, well-nourished, in no distress ENT - no lesions, no post nasal drip Neck: No JVD, no thyromegaly, no carotid bruits Lungs: no use of accessory muscles, no dullness to percussion, clear without rales or rhonchi  Cardiovascular: Rhythm regular, heart sounds  normal, no murmurs or gallops, no peripheral edema Musculoskeletal: No deformities, no cyanosis or clubbing        Assessment & Plan:

## 2016-06-19 NOTE — Patient Instructions (Signed)
Okay to take Delsym 5 ML twice daily as needed  Take storebrand chlorpheniramine 4 mg at bedtime Take codeine cough syrup 5 ML at bedtime as needed x 10days  Called for antibiotic if she develops green or yellow sputum

## 2016-06-19 NOTE — Telephone Encounter (Signed)
Pt. Is c/o of a cough, that has been on going for 2 weeks, started off non productive but now she feels like it has been worse, has to sleep at an angle to be able to sleep to stop the coughing. Has tried delsym with no relief.  Denies wheezing.  Pt. Requested an ov, an ov was made for her today with RA. Nothing further is needed at this time.

## 2016-06-19 NOTE — Assessment & Plan Note (Signed)
Acute flare  Okay to take Delsym 5 ML twice daily as needed  Take storebrand chlorpheniramine 4 mg at bedtime Take codeine cough syrup 5 ML at bedtime as needed x 10days  Called for antibiotic if she develops green or yellow sputum

## 2016-06-24 ENCOUNTER — Encounter (INDEPENDENT_AMBULATORY_CARE_PROVIDER_SITE_OTHER): Payer: Medicare Other | Admitting: Ophthalmology

## 2016-06-24 DIAGNOSIS — I10 Essential (primary) hypertension: Secondary | ICD-10-CM | POA: Diagnosis not present

## 2016-06-24 DIAGNOSIS — H43813 Vitreous degeneration, bilateral: Secondary | ICD-10-CM

## 2016-06-24 DIAGNOSIS — H35033 Hypertensive retinopathy, bilateral: Secondary | ICD-10-CM | POA: Diagnosis not present

## 2016-06-24 DIAGNOSIS — H35371 Puckering of macula, right eye: Secondary | ICD-10-CM

## 2016-07-06 DIAGNOSIS — M21622 Bunionette of left foot: Secondary | ICD-10-CM | POA: Diagnosis not present

## 2016-07-07 DIAGNOSIS — M1990 Unspecified osteoarthritis, unspecified site: Secondary | ICD-10-CM | POA: Diagnosis not present

## 2016-07-07 DIAGNOSIS — M419 Scoliosis, unspecified: Secondary | ICD-10-CM | POA: Diagnosis not present

## 2016-07-07 DIAGNOSIS — Z90710 Acquired absence of both cervix and uterus: Secondary | ICD-10-CM | POA: Diagnosis not present

## 2016-07-07 DIAGNOSIS — Z79899 Other long term (current) drug therapy: Secondary | ICD-10-CM | POA: Diagnosis not present

## 2016-07-07 DIAGNOSIS — Z8701 Personal history of pneumonia (recurrent): Secondary | ICD-10-CM | POA: Diagnosis not present

## 2016-07-07 DIAGNOSIS — M549 Dorsalgia, unspecified: Secondary | ICD-10-CM | POA: Diagnosis not present

## 2016-07-07 DIAGNOSIS — I10 Essential (primary) hypertension: Secondary | ICD-10-CM | POA: Diagnosis not present

## 2016-07-07 DIAGNOSIS — E785 Hyperlipidemia, unspecified: Secondary | ICD-10-CM | POA: Diagnosis not present

## 2016-07-07 DIAGNOSIS — M19072 Primary osteoarthritis, left ankle and foot: Secondary | ICD-10-CM | POA: Diagnosis not present

## 2016-07-07 DIAGNOSIS — K219 Gastro-esophageal reflux disease without esophagitis: Secondary | ICD-10-CM | POA: Diagnosis not present

## 2016-07-07 DIAGNOSIS — Z886 Allergy status to analgesic agent status: Secondary | ICD-10-CM | POA: Diagnosis not present

## 2016-07-07 DIAGNOSIS — G8929 Other chronic pain: Secondary | ICD-10-CM | POA: Diagnosis not present

## 2016-07-07 DIAGNOSIS — M21612 Bunion of left foot: Secondary | ICD-10-CM | POA: Diagnosis not present

## 2016-07-07 DIAGNOSIS — M2042 Other hammer toe(s) (acquired), left foot: Secondary | ICD-10-CM | POA: Diagnosis not present

## 2016-07-07 DIAGNOSIS — M2012 Hallux valgus (acquired), left foot: Secondary | ICD-10-CM | POA: Diagnosis not present

## 2016-07-07 DIAGNOSIS — Z880 Allergy status to penicillin: Secondary | ICD-10-CM | POA: Diagnosis not present

## 2016-07-07 DIAGNOSIS — F419 Anxiety disorder, unspecified: Secondary | ICD-10-CM | POA: Diagnosis not present

## 2016-07-15 ENCOUNTER — Telehealth: Payer: Self-pay | Admitting: Internal Medicine

## 2016-07-15 NOTE — Telephone Encounter (Signed)
Attempted to call patient regarding awv. Patient's phone was busy. Will attempt to call patient at a later time.

## 2016-07-16 ENCOUNTER — Telehealth: Payer: Self-pay

## 2016-07-16 NOTE — Telephone Encounter (Signed)
PA is initiated  Key [id number]

## 2016-07-17 NOTE — Telephone Encounter (Signed)
Left msg on triage stating receive PA need to clarify instructions and quantity before determination can be made...Bianca Haley

## 2016-07-17 NOTE — Telephone Encounter (Signed)
Spoke with insurance company to verify medication waiting approval

## 2016-07-17 NOTE — Telephone Encounter (Signed)
Rec'd call from [facility] stating PA for paroxetine has been approved starting today date through 07/16/17. Will fax and mail pt decision letter. Notified [facility] w/ approval status...Bianca Haley

## 2016-08-05 ENCOUNTER — Telehealth: Payer: Self-pay

## 2016-08-05 MED ORDER — ROSUVASTATIN CALCIUM 10 MG PO TABS: 10.0000 mg | ORAL_TABLET | Freq: Every day | ORAL | 0 refills | Status: DC

## 2016-08-05 NOTE — Telephone Encounter (Signed)
[facility] fax to refill Rosuvastatin

## 2016-08-12 ENCOUNTER — Telehealth: Payer: Self-pay | Admitting: Internal Medicine

## 2016-08-12 DIAGNOSIS — I10 Essential (primary) hypertension: Secondary | ICD-10-CM | POA: Diagnosis not present

## 2016-08-12 DIAGNOSIS — R3129 Other microscopic hematuria: Secondary | ICD-10-CM | POA: Diagnosis not present

## 2016-08-12 DIAGNOSIS — R350 Frequency of micturition: Secondary | ICD-10-CM | POA: Diagnosis not present

## 2016-08-12 NOTE — Telephone Encounter (Signed)
Pts husband called and said that the pt has had a fever for the last two days, chills, has been acting very irrational and has trouble making it to the bathroom. She has been yelling at him and said that she would not go anywhere with him. She also has a cast and is unable to walk. Terence Lux suggested that she could have a UTI based on the symptoms and that the pt should be seen. He stated again that she does not want to go to the doctor but that he would try to call for an ambulance to take her.

## 2016-08-14 ENCOUNTER — Ambulatory Visit (INDEPENDENT_AMBULATORY_CARE_PROVIDER_SITE_OTHER): Payer: Medicare Other | Admitting: Internal Medicine

## 2016-08-14 ENCOUNTER — Ambulatory Visit (INDEPENDENT_AMBULATORY_CARE_PROVIDER_SITE_OTHER)
Admission: RE | Admit: 2016-08-14 | Discharge: 2016-08-14 | Disposition: A | Discharge status: Home / Self Care | Payer: Medicare Other | Source: Ambulatory Visit | Attending: Internal Medicine | Admitting: Internal Medicine

## 2016-08-14 ENCOUNTER — Encounter: Payer: Self-pay | Admitting: Internal Medicine

## 2016-08-14 VITALS — BP 155/80 | HR 63 | Temp 97.5°F | Resp 18

## 2016-08-14 DIAGNOSIS — J069 Acute upper respiratory infection, unspecified: Secondary | ICD-10-CM | POA: Diagnosis not present

## 2016-08-14 DIAGNOSIS — I10 Essential (primary) hypertension: Secondary | ICD-10-CM

## 2016-08-14 DIAGNOSIS — R05 Cough: Secondary | ICD-10-CM

## 2016-08-14 DIAGNOSIS — N390 Urinary tract infection, site not specified: Secondary | ICD-10-CM | POA: Insufficient documentation

## 2016-08-14 DIAGNOSIS — N3000 Acute cystitis without hematuria: Secondary | ICD-10-CM

## 2016-08-14 DIAGNOSIS — R059 Cough, unspecified: Secondary | ICD-10-CM

## 2016-08-14 MED ORDER — AZITHROMYCIN 250 MG PO TABS: ORAL_TABLET | ORAL | 0 refills | Status: DC

## 2016-08-14 MED ORDER — TRIAMTERENE-HCTZ 37.5-25 MG PO TABS: 1.0000 | ORAL_TABLET | Freq: Every day | ORAL | 11 refills | Status: DC

## 2016-08-14 MED ORDER — BENZONATATE 200 MG PO CAPS: 200.0000 mg | ORAL_CAPSULE | Freq: Three times a day (TID) | ORAL | 0 refills | Status: DC | PRN

## 2016-08-14 NOTE — Progress Notes (Signed)
Pre visit review using our clinic review tool, if applicable. No additional management support is needed unless otherwise documented below in the visit note. 

## 2016-08-14 NOTE — Patient Instructions (Signed)
You can use over-the-counter  "Tylenol" or "Advil" for fever, chills and achyness. Use Halls or Ricola cough drops.    Please, make an appointment if you are not better or if you're worse.

## 2016-08-14 NOTE — Assessment & Plan Note (Signed)
Macrobid

## 2016-08-14 NOTE — Assessment & Plan Note (Signed)
Worse On Atenolol, Diltiazem Not taking HCTZ Added Maxzide

## 2016-08-14 NOTE — Progress Notes (Signed)
Subjective:  Patient ID: Bianca Haley, female    DOB: [birth year]  Age: 66 y.o. MRN: [id number]  CC: No chief complaint on file.   HPI Bianca Haley presents for UTI - given Macrobd on 4/12 C/o cough since Mon - worse. T 102 on Tue C/o high BP  Outpatient Medications Prior to Visit  Medication Sig Dispense Refill  . acetaminophen (TYLENOL) 325 MG tablet Take 1-2 tablets (325-650 mg total) by mouth every 6 (six) hours as needed for mild pain or moderate pain.    [name] albuterol (PROVENTIL HFA;VENTOLIN HFA) 108 (90 BASE) MCG/ACT inhaler 2 puffs every 4-6 hours as needed for wheezing/shortness of breath 8 g 5  . atenolol (TENORMIN) 100 MG tablet TAKE 1 TABLET BY MOUTH EVERY DAY 90 tablet 3  . calcium carbonate (OS-CAL - DOSED IN MG OF ELEMENTAL CALCIUM) 1250 (500 Ca) MG tablet Take 1 tablet by mouth daily with breakfast.    . cholecalciferol (VITAMIN D) 1000 units tablet Take 5,000 Units by mouth daily.    [name] Dextromethorphan-Guaifenesin (ROBITUSSIN DM) 10-100 MG/5ML liquid Take 5 mLs by mouth every 12 (twelve) hours as needed. 240 mL 0  . diazepam (VALIUM) 5 MG tablet Take 5 mg by mouth every 12 (twelve) hours as needed for anxiety or muscle spasms.   2  . diltiazem (CARDIZEM CD) 180 MG 24 hr capsule TAKE 1 CAPSULE (180 MG TOTAL) BY MOUTH DAILY. 180 capsule 2  . ferrous sulfate 325 (65 FE) MG tablet Take 1 tablet (325 mg total) by mouth 3 (three) times daily after meals.  3  . gabapentin (NEURONTIN) 300 MG capsule Take 1-3 capsules by mouth See admin instructions. Take 300 mg in the morning and 900 mg at bedtime. May take as needed throughout the day for nerve pain    . hydrochlorothiazide (MICROZIDE) 12.5 MG capsule Take 1 capsule (12.5 mg total) by mouth daily. 90 capsule 0  . neomycin-polymyxin-hydrocortisone (CORTISPORIN) otic solution Place 3 drops into both ears 3 (three) times daily. 10 mL 3  . oxymorphone (OPANA) 10 MG tablet Take 10 mg by mouth See admin instructions. Reported on  09/25/2015    . pantoprazole (PROTONIX) 40 MG tablet Take 1 tablet (40 mg total) by mouth 2 (two) times daily before a meal. 180 tablet 3  . PARoxetine (PAXIL) 40 MG tablet Take 1 tablet (40 mg total) by mouth 2 (two) times daily. Yearly physical due in may must see MD for refills 180 tablet 0  . polyethylene glycol (MIRALAX / GLYCOLAX) packet Take 17 g by mouth 2 (two) times daily. 14 each 0  . promethazine-codeine (PHENERGAN WITH CODEINE) 6.25-10 MG/5ML syrup Take 5 mLs by mouth every 6 (six) hours as needed for cough. 200 mL 0  . rosuvastatin (CRESTOR) 10 MG tablet Take 1 tablet (10 mg total) by mouth daily. 30 tablet 0  . traZODone (DESYREL) 100 MG tablet Take 1 tablet (100 mg total) by mouth at bedtime. 30 tablet 5  . SUMAtriptan (IMITREX) 100 MG tablet Take 1 tablet (100 mg total) by mouth once. May repeat in 2 hours if headache persists or recurs. 12 tablet 2   No facility-administered medications prior to visit.     ROS Review of Systems  Constitutional: Positive for fever. Negative for activity change, appetite change, chills, fatigue and unexpected weight change.  HENT: Positive for congestion and sinus pressure. Negative for mouth sores.   Eyes: Negative for visual disturbance.  Respiratory: Positive for cough and chest  tightness.   Gastrointestinal: Negative for abdominal pain and nausea.  Genitourinary: Negative for difficulty urinating, frequency and vaginal pain.  Musculoskeletal: Negative for back pain and gait problem.  Skin: Negative for pallor and rash.  Neurological: Negative for dizziness, tremors, weakness, numbness and headaches.  Psychiatric/Behavioral: Positive for sleep disturbance. Negative for confusion. The patient is not nervous/anxious.     Objective:  BP (!) 204/106   Pulse 63   Temp 97.5 F (36.4 C) (Oral)   Resp 18   SpO2 94%   BP Readings from Last 3 Encounters:  08/14/16 (!) 204/106  06/19/16 126/84  06/11/16 140/78    Wt Readings from Last 3  Encounters:  06/19/16 145 lb (65.8 kg)  06/11/16 157 lb (71.2 kg)  09/25/15 148 lb (67.1 kg)    Physical Exam  Constitutional: She appears well-developed. No distress.  HENT:  Head: Normocephalic.  Right Ear: External ear normal.  Left Ear: External ear normal.  Nose: Nose normal.  Mouth/Throat: Oropharynx is clear and moist.  Eyes: Conjunctivae are normal. Pupils are equal, round, and reactive to light. Right eye exhibits no discharge. Left eye exhibits no discharge.  Neck: Normal range of motion. Neck supple. No JVD present. No tracheal deviation present. No thyromegaly present.  Cardiovascular: Normal rate, regular rhythm and normal heart sounds.   Pulmonary/Chest: No stridor. No respiratory distress. She has no wheezes.  Abdominal: Soft. Bowel sounds are normal. She exhibits no distension and no mass. There is no tenderness. There is no rebound and no guarding.  Musculoskeletal: She exhibits no edema or tenderness.  Lymphadenopathy:    She has no cervical adenopathy.  Neurological: She displays normal reflexes. No cranial nerve deficit. She exhibits normal muscle tone. Coordination normal.  Skin: No rash noted. No erythema.  Psychiatric: She has a normal mood and affect. Her behavior is normal. Judgment and thought content normal.  coughing Hoarse s/p L foot surg in 3/18  Lab Results  Component Value Date   WBC 4.9 09/26/2015   HGB 12.4 09/26/2015   HCT 37.6 09/26/2015   PLT 291.0 09/26/2015   GLUCOSE 103 (H) 09/26/2015   CHOL 262 (H) 09/26/2015   TRIG 71.0 09/26/2015   HDL 61.60 09/26/2015   LDLCALC 186 (H) 09/26/2015   ALT 7 09/26/2015   AST 16 09/26/2015   NA 142 09/26/2015   K 4.2 09/26/2015   CL 105 09/26/2015   CREATININE 0.88 09/26/2015   BUN 17 09/26/2015   CO2 34 (H) 09/26/2015   TSH 1.56 09/26/2015   INR 0.98 07/04/2015    Dg Chest 2 View  Result Date: 12/04/2015 CLINICAL DATA:  Cough, wheezing EXAM: CHEST  2 VIEW COMPARISON:  CT chest of 07/16/2015  and chest x-ray of the same day FINDINGS: No definite active infiltrate or effusion is seen. Minimal opacity at the left lung base may be residual of the prior more consolidated opacity noted by CT chest on 07/16/2015. No pleural effusion is seen. Mediastinal and hilar contours are unremarkable. The heart is within normal limits in size. Slight thoracic curvature convex to the right is noted. IMPRESSION: No definite active process. Probable minimal scarring at the left base as noted above. Electronically Signed   By: Ivar Drape M.D.   On: 12/04/2015 13:47    Assessment & Plan:   There are no diagnoses linked to this encounter. I am having Ms. Goodnough maintain her oxymorphone, gabapentin, albuterol, diazepam, hydrochlorothiazide, Dextromethorphan-Guaifenesin, cholecalciferol, calcium carbonate, ferrous sulfate, polyethylene glycol, acetaminophen, pantoprazole, atenolol,  diltiazem, SUMAtriptan, traZODone, neomycin-polymyxin-hydrocortisone, PARoxetine, promethazine-codeine, and rosuvastatin.  No orders of the defined types were placed in this encounter.    Follow-up: No Follow-up on file.  [name], MD

## 2016-08-14 NOTE — Assessment & Plan Note (Signed)
Zpac CXR Tessalon Rx

## 2016-08-17 DIAGNOSIS — M76822 Posterior tibial tendinitis, left leg: Secondary | ICD-10-CM | POA: Diagnosis not present

## 2016-08-17 DIAGNOSIS — M25572 Pain in left ankle and joints of left foot: Secondary | ICD-10-CM | POA: Diagnosis not present

## 2016-09-07 DIAGNOSIS — M5136 Other intervertebral disc degeneration, lumbar region: Secondary | ICD-10-CM | POA: Diagnosis not present

## 2016-09-07 DIAGNOSIS — I10 Essential (primary) hypertension: Secondary | ICD-10-CM | POA: Diagnosis not present

## 2016-09-07 DIAGNOSIS — M5412 Radiculopathy, cervical region: Secondary | ICD-10-CM | POA: Diagnosis not present

## 2016-09-07 DIAGNOSIS — M25561 Pain in right knee: Secondary | ICD-10-CM | POA: Diagnosis not present

## 2016-09-07 DIAGNOSIS — M5416 Radiculopathy, lumbar region: Secondary | ICD-10-CM | POA: Diagnosis not present

## 2016-09-07 DIAGNOSIS — M542 Cervicalgia: Secondary | ICD-10-CM | POA: Diagnosis not present

## 2016-09-15 ENCOUNTER — Encounter: Payer: Self-pay | Admitting: Internal Medicine

## 2016-09-15 ENCOUNTER — Other Ambulatory Visit (INDEPENDENT_AMBULATORY_CARE_PROVIDER_SITE_OTHER): Payer: Medicare Other

## 2016-09-15 ENCOUNTER — Ambulatory Visit (INDEPENDENT_AMBULATORY_CARE_PROVIDER_SITE_OTHER): Payer: Medicare Other | Admitting: Internal Medicine

## 2016-09-15 DIAGNOSIS — R609 Edema, unspecified: Secondary | ICD-10-CM | POA: Insufficient documentation

## 2016-09-15 DIAGNOSIS — I10 Essential (primary) hypertension: Secondary | ICD-10-CM

## 2016-09-15 DIAGNOSIS — F411 Generalized anxiety disorder: Secondary | ICD-10-CM | POA: Diagnosis not present

## 2016-09-15 LAB — HEPATIC FUNCTION PANEL
ALT: 13 U/L (ref 0–35)
AST: 38 U/L — ABNORMAL HIGH (ref 0–37)
Albumin: 4.6 g/dL (ref 3.5–5.2)
Alkaline Phosphatase: 92 U/L (ref 39–117)
BILIRUBIN TOTAL: 0.5 mg/dL (ref 0.2–1.2)
Bilirubin, Direct: 0.2 mg/dL (ref 0.0–0.3)
Total Protein: 7.6 g/dL (ref 6.0–8.3)

## 2016-09-15 LAB — BASIC METABOLIC PANEL
BUN: 18 mg/dL (ref 6–23)
CHLORIDE: 95 meq/L — AB (ref 96–112)
CO2: 33 mEq/L — ABNORMAL HIGH (ref 19–32)
CREATININE: 1.31 mg/dL — AB (ref 0.40–1.20)
Calcium: 9.9 mg/dL (ref 8.4–10.5)
GFR: 42.9 mL/min — ABNORMAL LOW (ref >60.00)
GLUCOSE: 100 mg/dL — AB (ref 70–99)
POTASSIUM: 4 meq/L (ref 3.5–5.1)
Sodium: 136 mEq/L (ref 135–145)

## 2016-09-15 LAB — CBC WITH DIFFERENTIAL/PLATELET
BASOS PCT: 0.9 % (ref 0.0–3.0)
Basophils Absolute: 0 10*3/uL (ref 0.0–0.1)
EOS PCT: 3.4 % (ref 0.0–5.0)
Eosinophils Absolute: 0.2 10*3/uL (ref 0.0–0.7)
HCT: 38.5 % (ref 36.0–46.0)
Hemoglobin: 12.6 g/dL (ref 12.0–15.0)
LYMPHS ABS: 1.6 10*3/uL (ref 0.7–4.0)
Lymphocytes Relative: 30.4 % (ref 12.0–46.0)
MCHC: 32.9 g/dL (ref 30.0–36.0)
MCV: 78.5 fl (ref 78.0–100.0)
MONO ABS: 0.6 10*3/uL (ref 0.1–1.0)
Monocytes Relative: 10.7 % (ref 3.0–12.0)
NEUTROS ABS: 2.9 10*3/uL (ref 1.4–7.7)
NEUTROS PCT: 54.6 % (ref 43.0–77.0)
PLATELETS: 319 10*3/uL (ref 150.0–400.0)
RBC: 4.9 Mil/uL (ref 3.87–5.11)
RDW: 13.8 % (ref 11.5–15.5)
WBC: 5.3 10*3/uL (ref 4.0–10.5)

## 2016-09-15 MED ORDER — ZOSTER VAC RECOMB ADJUVANTED 50 MCG/0.5ML IM SUSR: 0.5000 mL | Freq: Once | INTRAMUSCULAR | 1 refills | Status: AC

## 2016-09-15 MED ORDER — TRIAMTERENE-HCTZ 37.5-25 MG PO TABS: 1.0000 | ORAL_TABLET | Freq: Every day | ORAL | 3 refills | Status: DC

## 2016-09-15 MED ORDER — DILTIAZEM HCL ER COATED BEADS 120 MG PO CP24: 120.0000 mg | ORAL_CAPSULE | Freq: Every day | ORAL | 3 refills | Status: DC

## 2016-09-15 NOTE — Assessment & Plan Note (Signed)
Doing fair 

## 2016-09-15 NOTE — Assessment & Plan Note (Signed)
Atenolol, Diltiazem Re-start Maxzide

## 2016-09-15 NOTE — Progress Notes (Signed)
Subjective:  Patient ID: Bianca Haley, female    DOB: 11-24-1950  Age: 66 y.o. MRN: [id number]  CC: No chief complaint on file.   HPI Bianca Haley presents for GERD, depression, HTN f/u Recovering from L foot and L knee surgery F/u cough - resolved C/o post-op swelling in the LLE   Outpatient Medications Prior to Visit  Medication Sig Dispense Refill  . acetaminophen (TYLENOL) 325 MG tablet Take 1-2 tablets (325-650 mg total) by mouth every 6 (six) hours as needed for mild pain or moderate pain.    [name] albuterol (PROVENTIL HFA;VENTOLIN HFA) 108 (90 BASE) MCG/ACT inhaler 2 puffs every 4-6 hours as needed for wheezing/shortness of breath 8 g 5  . atenolol (TENORMIN) 100 MG tablet TAKE 1 TABLET BY MOUTH EVERY DAY 90 tablet 3  . azithromycin (ZITHROMAX Z-PAK) 250 MG tablet As directed 6 each 0  . benzonatate (TESSALON) 200 MG capsule Take 1 capsule (200 mg total) by mouth 3 (three) times daily as needed for cough. 42 capsule 0  . calcium carbonate (OS-CAL - DOSED IN MG OF ELEMENTAL CALCIUM) 1250 (500 Ca) MG tablet Take 1 tablet by mouth daily with breakfast.    . cholecalciferol (VITAMIN D) 1000 units tablet Take 5,000 Units by mouth daily.    [name] Dextromethorphan-Guaifenesin (ROBITUSSIN DM) 10-100 MG/5ML liquid Take 5 mLs by mouth every 12 (twelve) hours as needed. 240 mL 0  . diazepam (VALIUM) 5 MG tablet Take 5 mg by mouth every 12 (twelve) hours as needed for anxiety or muscle spasms.   2  . diltiazem (CARDIZEM CD) 180 MG 24 hr capsule TAKE 1 CAPSULE (180 MG TOTAL) BY MOUTH DAILY. 180 capsule 2  . ferrous sulfate 325 (65 FE) MG tablet Take 1 tablet (325 mg total) by mouth 3 (three) times daily after meals.  3  . gabapentin (NEURONTIN) 300 MG capsule Take 1-3 capsules by mouth See admin instructions. Take 300 mg in the morning and 900 mg at bedtime. May take as needed throughout the day for nerve pain    . neomycin-polymyxin-hydrocortisone (CORTISPORIN) otic solution Place 3 drops into  both ears 3 (three) times daily. 10 mL 3  . oxymorphone (OPANA) 10 MG tablet Take 10 mg by mouth See admin instructions. Reported on 09/25/2015    . pantoprazole (PROTONIX) 40 MG tablet Take 1 tablet (40 mg total) by mouth 2 (two) times daily before a meal. 180 tablet 3  . PARoxetine (PAXIL) 40 MG tablet Take 1 tablet (40 mg total) by mouth 2 (two) times daily. Yearly physical due in may must see MD for refills 180 tablet 0  . polyethylene glycol (MIRALAX / GLYCOLAX) packet Take 17 g by mouth 2 (two) times daily. 14 each 0  . rosuvastatin (CRESTOR) 10 MG tablet Take 1 tablet (10 mg total) by mouth daily. 30 tablet 0  . traZODone (DESYREL) 100 MG tablet Take 1 tablet (100 mg total) by mouth at bedtime. 30 tablet 5  . triamterene-hydrochlorothiazide (MAXZIDE-25) 37.5-25 MG tablet Take 1 tablet by mouth daily. 30 tablet 11  . SUMAtriptan (IMITREX) 100 MG tablet Take 1 tablet (100 mg total) by mouth once. May repeat in 2 hours if headache persists or recurs. 12 tablet 2   No facility-administered medications prior to visit.     ROS Review of Systems  Constitutional: Negative for activity change, appetite change, chills, fatigue and unexpected weight change.  HENT: Negative for congestion, mouth sores and sinus pressure.   Eyes: Negative  for visual disturbance.  Respiratory: Negative for cough and chest tightness.   Gastrointestinal: Negative for abdominal pain and nausea.  Genitourinary: Negative for difficulty urinating, frequency and vaginal pain.  Musculoskeletal: Positive for arthralgias and gait problem. Negative for back pain.  Skin: Negative for pallor and rash.  Neurological: Negative for dizziness, tremors, weakness, numbness and headaches.  Psychiatric/Behavioral: Negative for confusion, sleep disturbance and suicidal ideas.    Objective:  BP 138/76 (BP Location: Left Arm, Patient Position: Sitting, Cuff Size: Normal)   Pulse (!) 56   Temp 98.1 F (36.7 C) (Oral)   Ht 5' 5.5"  (1.664 m)   Wt 142 lb 1.3 oz (64.4 kg)   SpO2 96%   BMI 23.28 kg/m   BP Readings from Last 3 Encounters:  09/15/16 138/76  08/14/16 (!) 155/80  06/19/16 126/84    Wt Readings from Last 3 Encounters:  09/15/16 142 lb 1.3 oz (64.4 kg)  06/19/16 145 lb (65.8 kg)  06/11/16 157 lb (71.2 kg)    Physical Exam  Constitutional: She appears well-developed. No distress.  HENT:  Head: Normocephalic.  Right Ear: External ear normal.  Left Ear: External ear normal.  Nose: Nose normal.  Mouth/Throat: Oropharynx is clear and moist.  Eyes: Conjunctivae are normal. Pupils are equal, round, and reactive to light. Right eye exhibits no discharge. Left eye exhibits no discharge.  Neck: Normal range of motion. Neck supple. No JVD present. No tracheal deviation present. No thyromegaly present.  Cardiovascular: Normal rate, regular rhythm and normal heart sounds.   Pulmonary/Chest: No stridor. No respiratory distress. She has no wheezes.  Abdominal: Soft. Bowel sounds are normal. She exhibits no distension and no mass. There is no tenderness. There is no rebound and no guarding.  Musculoskeletal: She exhibits edema and tenderness.  Lymphadenopathy:    She has no cervical adenopathy.  Neurological: She displays normal reflexes. No cranial nerve deficit. She exhibits normal muscle tone. Coordination abnormal.  Skin: No rash noted. No erythema.  Psychiatric: She has a normal mood and affect. Her behavior is normal. Judgment and thought content normal.  L foot scars healed L leg with 1+ edema  Lab Results  Component Value Date   WBC 4.9 09/26/2015   HGB 12.4 09/26/2015   HCT 37.6 09/26/2015   PLT 291.0 09/26/2015   GLUCOSE 103 (H) 09/26/2015   CHOL 262 (H) 09/26/2015   TRIG 71.0 09/26/2015   HDL 61.60 09/26/2015   LDLCALC 186 (H) 09/26/2015   ALT 7 09/26/2015   AST 16 09/26/2015   NA 142 09/26/2015   K 4.2 09/26/2015   CL 105 09/26/2015   CREATININE 0.88 09/26/2015   BUN 17 09/26/2015    CO2 34 (H) 09/26/2015   TSH 1.56 09/26/2015   INR 0.98 07/04/2015    Dg Chest 2 View  Result Date: 08/15/2016 CLINICAL DATA:  Acute onset of cough and congestion. Initial encounter. EXAM: CHEST  2 VIEW COMPARISON:  Chest radiograph performed 12/04/2015 FINDINGS: The lungs are well-aerated. Right lower lobe airspace opacity is compatible with pneumonia. Mild left basilar atelectasis is noted. There is no evidence of pleural effusion or pneumothorax. The heart is borderline enlarged. No acute osseous abnormalities are seen. IMPRESSION: Right lower lobe pneumonia. Mild left basilar atelectasis seen. Borderline cardiomegaly. Electronically Signed   By: Garald Balding M.D.   On: 08/15/2016 06:59    Assessment & Plan:   There are no diagnoses linked to this encounter. I am having Ms. Vold maintain her oxymorphone, gabapentin,  albuterol, diazepam, Dextromethorphan-Guaifenesin, cholecalciferol, calcium carbonate, ferrous sulfate, polyethylene glycol, acetaminophen, pantoprazole, atenolol, diltiazem, SUMAtriptan, traZODone, neomycin-polymyxin-hydrocortisone, PARoxetine, rosuvastatin, azithromycin, benzonatate, and triamterene-hydrochlorothiazide.  No orders of the defined types were placed in this encounter.    Follow-up: No Follow-up on file.  [name], MD

## 2016-09-15 NOTE — Assessment & Plan Note (Signed)
?  post-op D-dimer Re-start Maxzide Reduced Diltiazem

## 2016-09-15 NOTE — Patient Instructions (Signed)
Elevate legs Use compression socks

## 2016-09-16 ENCOUNTER — Other Ambulatory Visit (INDEPENDENT_AMBULATORY_CARE_PROVIDER_SITE_OTHER): Payer: Medicare Other

## 2016-09-16 ENCOUNTER — Encounter: Payer: Self-pay | Admitting: Internal Medicine

## 2016-09-16 ENCOUNTER — Ambulatory Visit (INDEPENDENT_AMBULATORY_CARE_PROVIDER_SITE_OTHER)
Admission: RE | Admit: 2016-09-16 | Discharge: 2016-09-16 | Disposition: A | Discharge status: Home / Self Care | Payer: Medicare Other | Source: Ambulatory Visit | Attending: Internal Medicine | Admitting: Internal Medicine

## 2016-09-16 ENCOUNTER — Ambulatory Visit (INDEPENDENT_AMBULATORY_CARE_PROVIDER_SITE_OTHER): Payer: Medicare Other | Admitting: Internal Medicine

## 2016-09-16 VITALS — BP 134/82 | HR 81 | Temp 101.0°F | Resp 16 | Wt 141.0 lb

## 2016-09-16 DIAGNOSIS — R52 Pain, unspecified: Secondary | ICD-10-CM

## 2016-09-16 DIAGNOSIS — J181 Lobar pneumonia, unspecified organism: Secondary | ICD-10-CM

## 2016-09-16 DIAGNOSIS — R410 Disorientation, unspecified: Secondary | ICD-10-CM

## 2016-09-16 DIAGNOSIS — R509 Fever, unspecified: Secondary | ICD-10-CM | POA: Diagnosis not present

## 2016-09-16 DIAGNOSIS — R05 Cough: Secondary | ICD-10-CM | POA: Diagnosis not present

## 2016-09-16 DIAGNOSIS — R059 Cough, unspecified: Secondary | ICD-10-CM | POA: Insufficient documentation

## 2016-09-16 DIAGNOSIS — J189 Pneumonia, unspecified organism: Secondary | ICD-10-CM

## 2016-09-16 LAB — URINALYSIS, ROUTINE W REFLEX MICROSCOPIC
BILIRUBIN URINE: NEGATIVE
Hgb urine dipstick: NEGATIVE
LEUKOCYTES UA: NEGATIVE
NITRITE: NEGATIVE
SPECIFIC GRAVITY, URINE: 1.025 (ref 1.000–1.030)
UROBILINOGEN UA: 0.2 (ref 0.0–1.0)
Urine Glucose: NEGATIVE
pH: 5.5 (ref 5.0–8.0)

## 2016-09-16 LAB — TSH: TSH: 2.11 u[IU]/mL (ref 0.35–4.50)

## 2016-09-16 LAB — POCT INFLUENZA A/B
INFLUENZA A, POC: NEGATIVE
INFLUENZA B, POC: NEGATIVE

## 2016-09-16 LAB — D-DIMER, QUANTITATIVE (NOT AT ARMC): D DIMER QUANT: 0.75 ug{FEU}/mL — AB (ref <0.50)

## 2016-09-16 MED ORDER — LEVOFLOXACIN 750 MG PO TABS: 750.0000 mg | ORAL_TABLET | Freq: Every day | ORAL | 0 refills | Status: DC

## 2016-09-16 MED ORDER — BENZONATATE 200 MG PO CAPS: 200.0000 mg | ORAL_CAPSULE | Freq: Three times a day (TID) | ORAL | 0 refills | Status: DC | PRN

## 2016-09-16 NOTE — Patient Instructions (Addendum)
Have a chest xray and give a urine sample today.  We will call you with the results today.  Your antibiotic and cough pills were sent to your pharmacy.  Take over-the-counter cold medications if needed.   Drink plenty of fluids.   If your symptoms worsen you need to go to the ED.

## 2016-09-16 NOTE — Progress Notes (Signed)
Subjective:    Patient ID: Bianca Haley, female    DOB: Aug 26, 1950, 66 y.o.   MRN: [id number]  HPI She is here for an acute visit.   She saw Dr Camila Li yesterday for a follow up visit and had blood work done to rule out a blood clot in her left leg.  Blood work was unremarkable.  Her vitals were all normal yesterday.   Over night she had sudden onset of chills, fever, body aches, and fatigue.   Her temperature at home was 103.6.  She has been confused today.  She is a poor historian due to the confusion, but her boyfriend helps with history.  She states a mild dry cough, sob, wheeze at times. She states headaches, lightheadedness, sinus pressure, mild abdominal pain and decreased appetite.  She has not taken any medication today - her routine meds or cold medications.     Medications and allergies reviewed with patient and updated if appropriate.  Patient Active Problem List   Diagnosis Date Noted  . Edema 09/15/2016  . Upper respiratory infection 08/14/2016  . Urinary tract infection 08/14/2016  . Cerumen impaction 06/11/2016  . Dyslipidemia 09/25/2015  . S/P left TKA 07/15/2015  . S/P knee replacement 07/15/2015  . Esophageal dysphagia 09/18/2014  . GERD (gastroesophageal reflux disease) 09/04/2014  . Chronic narcotic dependence ([place]) 09/04/2014  . Gross hematuria 08/31/2014  . Hemorrhagic cystitis 08/24/2014  . Bilateral lower extremity edema 04/23/2014  . Osteoarthritis of left knee 10/25/2013  . Knee pain, left 10/13/2013  . Left leg swelling 10/13/2013  . Chronic neck and back pain 09/28/2013  . Hypokalemia 09/28/2013  . Abnormal CT scan, head 09/27/2013  . Diastolic dysfunction- grade I 02/24/2013  . Recurrent pneumonia 02/21/2013  . Chronic cough 01/04/2013  . Contracture of palmar fascia (Dupuytren's) 08/04/2011  . VENOUS INSUFFICIENCY, LEFT LEG 05/14/2010  . CRF (chronic renal failure) 12/02/2009  . INGUINAL PAIN, RIGHT 12/02/2009  . UNSPECIFIED ANEMIA  11/28/2009  . PULMONARY NODULE 05/10/2008  . FIBROIDS, UTERUS 08/03/2007  . Anxiety state 08/03/2007  . MIGRAINE HEADACHE 08/03/2007  . TINNITUS, LEFT 08/03/2007  . Essential hypertension 08/03/2007  . ENDOMETRIOSIS 08/03/2007  . ALCOHOL ABUSE, HX OF 08/03/2007  . MEASLES, HX OF 08/03/2007  . Personal history of urinary disorder 08/03/2007    Current Outpatient Prescriptions on File Prior to Visit  Medication Sig Dispense Refill  . acetaminophen (TYLENOL) 325 MG tablet Take 1-2 tablets (325-650 mg total) by mouth every 6 (six) hours as needed for mild pain or moderate pain.    [name] albuterol (PROVENTIL HFA;VENTOLIN HFA) 108 (90 BASE) MCG/ACT inhaler 2 puffs every 4-6 hours as needed for wheezing/shortness of breath 8 g 5  . atenolol (TENORMIN) 100 MG tablet TAKE 1 TABLET BY MOUTH EVERY DAY 90 tablet 3  . benzonatate (TESSALON) 200 MG capsule Take 1 capsule (200 mg total) by mouth 3 (three) times daily as needed for cough. 42 capsule 0  . calcium carbonate (OS-CAL - DOSED IN MG OF ELEMENTAL CALCIUM) 1250 (500 Ca) MG tablet Take 1 tablet by mouth daily with breakfast.    . cholecalciferol (VITAMIN D) 1000 units tablet Take 5,000 Units by mouth daily.    . diazepam (VALIUM) 5 MG tablet Take 5 mg by mouth every 12 (twelve) hours as needed for anxiety or muscle spasms.   2  . diltiazem (CARDIZEM CD) 120 MG 24 hr capsule Take 1 capsule (120 mg total) by mouth daily. 90 capsule 3  .  ferrous sulfate 325 (65 FE) MG tablet Take 1 tablet (325 mg total) by mouth 3 (three) times daily after meals.  3  . gabapentin (NEURONTIN) 300 MG capsule Take 1-3 capsules by mouth See admin instructions. Take 300 mg in the morning and 900 mg at bedtime. May take as needed throughout the day for nerve pain    . neomycin-polymyxin-hydrocortisone (CORTISPORIN) otic solution Place 3 drops into both ears 3 (three) times daily. 10 mL 3  . oxymorphone (OPANA) 10 MG tablet Take 10 mg by mouth See admin instructions. Reported on  09/25/2015    . pantoprazole (PROTONIX) 40 MG tablet Take 1 tablet (40 mg total) by mouth 2 (two) times daily before a meal. 180 tablet 3  . PARoxetine (PAXIL) 40 MG tablet Take 1 tablet (40 mg total) by mouth 2 (two) times daily. Yearly physical due in may must see MD for refills 180 tablet 0  . polyethylene glycol (MIRALAX / GLYCOLAX) packet Take 17 g by mouth 2 (two) times daily. 14 each 0  . rosuvastatin (CRESTOR) 10 MG tablet Take 1 tablet (10 mg total) by mouth daily. 30 tablet 0  . traZODone (DESYREL) 100 MG tablet Take 1 tablet (100 mg total) by mouth at bedtime. 30 tablet 5  . triamterene-hydrochlorothiazide (MAXZIDE-25) 37.5-25 MG tablet Take 1 tablet by mouth daily. 90 tablet 3  . SUMAtriptan (IMITREX) 100 MG tablet Take 1 tablet (100 mg total) by mouth once. May repeat in 2 hours if headache persists or recurs. 12 tablet 2   No current facility-administered medications on file prior to visit.     Past Medical History:  Diagnosis Date  . Anemia   . Anxiety   . Arthritis   . Back pain   . Chronic renal failure    patient denies   . GERD (gastroesophageal reflux disease)   . H/O tinnitus    left  . Headache(784.0)   . History of endometriosis   . History of uterine fibroid   . History of UTI   . Hypertension   . Migraine   . Recurrent pneumonia     Past Surgical History:  Procedure Laterality Date  . ABDOMINAL HYSTERECTOMY    . BACK SURGERY    . BLADDER SUSPENSION    . COLONOSCOPY  02/16/2008   normal  . laparoscopy     for evauation of endometriosis  . TOTAL KNEE ARTHROPLASTY Left 07/15/2015   Procedure: TOTAL LEFT KNEE ARTHROPLASTY;  Surgeon: Paralee Cancel, MD;  Location: [facility];  Service: Orthopedics;  Laterality: Left;    Social History   Social History  . Marital status: Widowed    Spouse name: N/A  . Number of children: 2  . Years of education: 16   Occupational History  . interior designer    Social History Main Topics  . Smoking status: Never  Smoker  . Smokeless tobacco: Never Used  . Alcohol use No     Comment: no drinking for sevral months  . Drug use: No  . Sexual activity: Yes    Partners: Male   Other Topics Concern  . Not on file   Social History Narrative   HSG, [employer] BS interior design. Married '79 - 28 years/divorced. Now engaged. 1 dtr - '84 - lawyer, 1 son '89. No h/o abuse.    Family History  Problem Relation Age of Onset  . Emphysema Mother   . Heart disease Mother 20       MI  Review of Systems  Constitutional: Positive for appetite change, chills, fatigue and fever.  HENT: Positive for sinus pressure. Negative for congestion, ear pain, sinus pain and sore throat.   Respiratory: Positive for cough (dry), shortness of breath and wheezing.   Cardiovascular: Negative for chest pain.  Gastrointestinal: Positive for abdominal pain (a little). Negative for diarrhea and nausea.  Genitourinary: Negative for dysuria and hematuria.  Musculoskeletal: Positive for myalgias.  Neurological: Positive for light-headedness and headaches.  Psychiatric/Behavioral: Positive for confusion.       Objective:   Vitals:   09/16/16 1112  BP: 134/82  Pulse: 81  Resp: 16  Temp: (!) 101 F (38.3 C)   Filed Weights   09/16/16 1112  Weight: 141 lb (64 kg)   Body mass index is 23.11 kg/m.  Wt Readings from Last 3 Encounters:  09/16/16 141 lb (64 kg)  09/15/16 142 lb 1.3 oz (64.4 kg)  06/19/16 145 lb (65.8 kg)     Physical Exam GENERAL APPEARANCE: Appears stated age, mildly ill appearing, NAD EYES: conjunctiva clear, no icterus HEENT: bilateral tympanic membranes and ear canals normal, oropharynx with mild erythema, no thyromegaly, trachea midline, no cervical or supraclavicular lymphadenopathy LUNGS: Clear to auscultation without wheeze or crackles, unlabored breathing, good air entry bilaterally HEART: Normal S1,S2 without murmurs.  Trace edema LLE Abdomen: soft, non tender, non distended Neuro; mild  confusion - at times answering questions incorrectly        Assessment & Plan:   She presents with < 24 hrs of high fever, chills, aches, confusion with dry cough, sob, wheeze, hypoxia on exam Strong concern for pneumonia.  CXR one month ago showed Pneumonia - treated with a zpak and did get better She has had pneumonia in the past that has presented this way She also had a recent UTI and was treated for that as well - no current urinary symptoms, but will check UA, UCx Start empiric levaquin  -- probable PNA, possible UTI  Stat CXR UA, UCx  Tessalon perles for cough Otc cold meds prn Rest, fluids  Advised that they need to have a low tolerance for going to the ED, especially given her hypoxia and confusion - they want to avoid the ED but if no improvement or any worsening of her symptoms the next 24 hours they should go to the emergency room

## 2016-09-17 LAB — URINE CULTURE: ORGANISM ID, BACTERIA: NO GROWTH

## 2016-09-22 ENCOUNTER — Telehealth: Payer: Self-pay | Admitting: Internal Medicine

## 2016-09-22 NOTE — Telephone Encounter (Signed)
Please advise 

## 2016-09-22 NOTE — Telephone Encounter (Signed)
I can see her sooner, however CXR would not improve before 4-6 weeks Thx

## 2016-09-22 NOTE — Telephone Encounter (Signed)
Pt called and said that she was here to see Dr Alain Marion and Dr Quay Burow (Dr Alain Marion had to leave so Dr Quay Burow finished up with her) on 09/16/16. She was diagnosed with pneumonia. She was told to follow up in 6 weeks with Dr Alain Marion. She is wondering is she should come in sooner to follow up and possibly have a chest x-ray done to make sure it has cleared up. Please advise.

## 2016-09-22 NOTE — Telephone Encounter (Signed)
Pt notified and stated she will just keep her appt

## 2016-09-23 ENCOUNTER — Other Ambulatory Visit: Payer: Self-pay | Admitting: *Deleted

## 2016-09-23 DIAGNOSIS — M76822 Posterior tibial tendinitis, left leg: Secondary | ICD-10-CM | POA: Diagnosis not present

## 2016-09-23 DIAGNOSIS — M2012 Hallux valgus (acquired), left foot: Secondary | ICD-10-CM | POA: Diagnosis not present

## 2016-09-23 MED ORDER — PAROXETINE HCL 40 MG PO TABS: 40.0000 mg | ORAL_TABLET | Freq: Two times a day (BID) | ORAL | 3 refills | Status: DC

## 2016-09-30 DIAGNOSIS — Z96652 Presence of left artificial knee joint: Secondary | ICD-10-CM | POA: Diagnosis not present

## 2016-09-30 DIAGNOSIS — Z471 Aftercare following joint replacement surgery: Secondary | ICD-10-CM | POA: Diagnosis not present

## 2016-10-22 DIAGNOSIS — B9689 Other specified bacterial agents as the cause of diseases classified elsewhere: Secondary | ICD-10-CM | POA: Diagnosis not present

## 2016-10-22 DIAGNOSIS — Z981 Arthrodesis status: Secondary | ICD-10-CM | POA: Diagnosis not present

## 2016-10-22 DIAGNOSIS — G8918 Other acute postprocedural pain: Secondary | ICD-10-CM | POA: Diagnosis not present

## 2016-10-22 DIAGNOSIS — T8469XA Infection and inflammatory reaction due to internal fixation device of other site, initial encounter: Secondary | ICD-10-CM | POA: Diagnosis not present

## 2016-10-23 DIAGNOSIS — M86672 Other chronic osteomyelitis, left ankle and foot: Secondary | ICD-10-CM | POA: Diagnosis not present

## 2016-10-23 DIAGNOSIS — G8918 Other acute postprocedural pain: Secondary | ICD-10-CM | POA: Diagnosis not present

## 2016-10-23 DIAGNOSIS — T814XXA Infection following a procedure, initial encounter: Secondary | ICD-10-CM | POA: Diagnosis not present

## 2016-10-24 DIAGNOSIS — G8918 Other acute postprocedural pain: Secondary | ICD-10-CM | POA: Diagnosis not present

## 2016-10-25 DIAGNOSIS — G8918 Other acute postprocedural pain: Secondary | ICD-10-CM | POA: Diagnosis not present

## 2016-10-26 DIAGNOSIS — R262 Difficulty in walking, not elsewhere classified: Secondary | ICD-10-CM | POA: Diagnosis not present

## 2016-10-26 DIAGNOSIS — G8918 Other acute postprocedural pain: Secondary | ICD-10-CM | POA: Diagnosis not present

## 2016-10-26 DIAGNOSIS — T814XXD Infection following a procedure, subsequent encounter: Secondary | ICD-10-CM | POA: Diagnosis not present

## 2016-10-27 DIAGNOSIS — G8918 Other acute postprocedural pain: Secondary | ICD-10-CM | POA: Diagnosis not present

## 2016-10-27 DIAGNOSIS — R262 Difficulty in walking, not elsewhere classified: Secondary | ICD-10-CM | POA: Diagnosis not present

## 2016-10-28 ENCOUNTER — Ambulatory Visit: Payer: Medicare Other | Admitting: Internal Medicine

## 2016-10-28 DIAGNOSIS — R262 Difficulty in walking, not elsewhere classified: Secondary | ICD-10-CM | POA: Diagnosis not present

## 2016-10-28 DIAGNOSIS — G8918 Other acute postprocedural pain: Secondary | ICD-10-CM | POA: Diagnosis not present

## 2016-10-29 DIAGNOSIS — G8918 Other acute postprocedural pain: Secondary | ICD-10-CM | POA: Diagnosis not present

## 2016-11-06 ENCOUNTER — Ambulatory Visit (INDEPENDENT_AMBULATORY_CARE_PROVIDER_SITE_OTHER): Payer: Medicare Other | Admitting: Internal Medicine

## 2016-11-06 ENCOUNTER — Ambulatory Visit (INDEPENDENT_AMBULATORY_CARE_PROVIDER_SITE_OTHER)
Admission: RE | Admit: 2016-11-06 | Discharge: 2016-11-06 | Disposition: A | Discharge status: Home / Self Care | Payer: Medicare Other | Source: Ambulatory Visit | Attending: Internal Medicine | Admitting: Internal Medicine

## 2016-11-06 ENCOUNTER — Encounter: Payer: Self-pay | Admitting: Internal Medicine

## 2016-11-06 DIAGNOSIS — J189 Pneumonia, unspecified organism: Secondary | ICD-10-CM | POA: Diagnosis not present

## 2016-11-06 DIAGNOSIS — Z889 Allergy status to unspecified drugs, medicaments and biological substances status: Secondary | ICD-10-CM

## 2016-11-06 DIAGNOSIS — Z87892 Personal history of anaphylaxis: Secondary | ICD-10-CM

## 2016-11-06 DIAGNOSIS — J181 Lobar pneumonia, unspecified organism: Secondary | ICD-10-CM | POA: Diagnosis not present

## 2016-11-06 DIAGNOSIS — R059 Cough, unspecified: Secondary | ICD-10-CM

## 2016-11-06 DIAGNOSIS — I10 Essential (primary) hypertension: Secondary | ICD-10-CM

## 2016-11-06 DIAGNOSIS — R05 Cough: Secondary | ICD-10-CM

## 2016-11-06 DIAGNOSIS — T7840XD Allergy, unspecified, subsequent encounter: Secondary | ICD-10-CM

## 2016-11-06 DIAGNOSIS — T7840XA Allergy, unspecified, initial encounter: Secondary | ICD-10-CM | POA: Insufficient documentation

## 2016-11-06 NOTE — Assessment & Plan Note (Signed)
Atenolol, Diltiazem Maxzide  BP Readings from Last 3 Encounters:  11/06/16 128/76  09/16/16 134/82  09/15/16 138/76

## 2016-11-06 NOTE — Assessment & Plan Note (Signed)
Resolved Repeat CXR

## 2016-11-06 NOTE — Progress Notes (Signed)
Subjective:  Patient ID: Bianca Haley, female    DOB: [birth year]  Age: 66 y.o. MRN: [id number]  CC: No chief complaint on file.   HPI Bianca Haley presents for post-hosp stay f/u and to f/u on her L foot post-op infection and  a rash. She had an  abx reaction: adm to [facility] for 7 d and d/c'd 1 week ago. On Levaquin and Bactrim now. Needs a f/u EKG. Feeling better.  [facility] records reviewed  Outpatient Medications Prior to Visit  Medication Sig Dispense Refill  . acetaminophen (TYLENOL) 325 MG tablet Take 1-2 tablets (325-650 mg total) by mouth every 6 (six) hours as needed for mild pain or moderate pain.    [name] albuterol (PROVENTIL HFA;VENTOLIN HFA) 108 (90 BASE) MCG/ACT inhaler 2 puffs every 4-6 hours as needed for wheezing/shortness of breath 8 g 5  . atenolol (TENORMIN) 100 MG tablet TAKE 1 TABLET BY MOUTH EVERY DAY 90 tablet 3  . benzonatate (TESSALON) 200 MG capsule Take 1 capsule (200 mg total) by mouth 3 (three) times daily as needed for cough. 42 capsule 0  . calcium carbonate (OS-CAL - DOSED IN MG OF ELEMENTAL CALCIUM) 1250 (500 Ca) MG tablet Take 1 tablet by mouth daily with breakfast.    . cholecalciferol (VITAMIN D) 1000 units tablet Take 5,000 Units by mouth daily.    . diazepam (VALIUM) 5 MG tablet Take 5 mg by mouth every 12 (twelve) hours as needed for anxiety or muscle spasms.   2  . diltiazem (CARDIZEM CD) 120 MG 24 hr capsule Take 1 capsule (120 mg total) by mouth daily. 90 capsule 3  . gabapentin (NEURONTIN) 300 MG capsule Take 1-3 capsules by mouth See admin instructions. Take 300 mg in the morning and 900 mg at bedtime. May take as needed throughout the day for nerve pain    . levofloxacin (LEVAQUIN) 750 MG tablet Take 1 tablet (750 mg total) by mouth daily. 7 tablet 0  . oxymorphone (OPANA) 10 MG tablet Take 10 mg by mouth See admin instructions. Reported on 09/25/2015    . pantoprazole (PROTONIX) 40 MG tablet Take 1 tablet (40 mg total) by mouth 2 (two) times daily  before a meal. 180 tablet 3  . PARoxetine (PAXIL) 40 MG tablet Take 1 tablet (40 mg total) by mouth 2 (two) times daily. 180 tablet 3  . polyethylene glycol (MIRALAX / GLYCOLAX) packet Take 17 g by mouth 2 (two) times daily. 14 each 0  . rosuvastatin (CRESTOR) 10 MG tablet Take 1 tablet (10 mg total) by mouth daily. 30 tablet 0  . traZODone (DESYREL) 100 MG tablet Take 1 tablet (100 mg total) by mouth at bedtime. 30 tablet 5  . triamterene-hydrochlorothiazide (MAXZIDE-25) 37.5-25 MG tablet Take 1 tablet by mouth daily. 90 tablet 3  . SUMAtriptan (IMITREX) 100 MG tablet Take 1 tablet (100 mg total) by mouth once. May repeat in 2 hours if headache persists or recurs. 12 tablet 2  . ferrous sulfate 325 (65 FE) MG tablet Take 1 tablet (325 mg total) by mouth 3 (three) times daily after meals.  3   No facility-administered medications prior to visit.     ROS Review of Systems  Constitutional: Negative for activity change, appetite change, chills, fatigue and unexpected weight change.  HENT: Negative for congestion, mouth sores and sinus pressure.   Eyes: Negative for visual disturbance.  Respiratory: Negative for cough and chest tightness.   Gastrointestinal: Negative for abdominal pain and nausea.  Genitourinary: Negative for difficulty urinating, frequency and vaginal pain.  Musculoskeletal: Positive for arthralgias, back pain and gait problem.  Skin: Negative for pallor and rash.  Neurological: Negative for dizziness, tremors, weakness, numbness and headaches.  Psychiatric/Behavioral: Negative for confusion and sleep disturbance.    Objective:  BP 128/76 (BP Location: Left Arm, Patient Position: Sitting, Cuff Size: Normal)   Pulse (!) 53   Temp 98.2 F (36.8 C) (Oral)   Ht 5' 5.5" (1.664 m)   Wt 138 lb (62.6 kg)   SpO2 95%   BMI 22.62 kg/m   BP Readings from Last 3 Encounters:  11/06/16 128/76  09/16/16 134/82  09/15/16 138/76    Wt Readings from Last 3 Encounters:  11/06/16  138 lb (62.6 kg)  09/16/16 141 lb (64 kg)  09/15/16 142 lb 1.3 oz (64.4 kg)    Physical Exam  Constitutional: She appears well-developed. No distress.  HENT:  Head: Normocephalic.  Right Ear: External ear normal.  Left Ear: External ear normal.  Nose: Nose normal.  Mouth/Throat: Oropharynx is clear and moist.  Eyes: Conjunctivae are normal. Pupils are equal, round, and reactive to light. Right eye exhibits no discharge. Left eye exhibits no discharge.  Neck: Normal range of motion. Neck supple. No JVD present. No tracheal deviation present. No thyromegaly present.  Cardiovascular: Normal rate, regular rhythm and normal heart sounds.   Pulmonary/Chest: No stridor. No respiratory distress. She has no wheezes.  Abdominal: Soft. Bowel sounds are normal. She exhibits no distension and no mass. There is no tenderness. There is no rebound and no guarding.  Musculoskeletal: She exhibits tenderness. She exhibits no edema.  Lymphadenopathy:    She has no cervical adenopathy.  Neurological: She displays normal reflexes. No cranial nerve deficit. She exhibits normal muscle tone. Coordination abnormal.  Skin: No rash noted. No erythema.  Psychiatric: She has a normal mood and affect. Her behavior is normal. Judgment and thought content normal.   Walker L foot in a cast   Procedure: EKG Indication: f/u arrhythmia, inpatient Impression: 1st degree AV block. S brady. No QT changes.   Lab Results  Component Value Date   WBC 5.3 09/15/2016   HGB 12.6 09/15/2016   HCT 38.5 09/15/2016   PLT 319.0 09/15/2016   GLUCOSE 100 (H) 09/15/2016   CHOL 262 (H) 09/26/2015   TRIG 71.0 09/26/2015   HDL 61.60 09/26/2015   LDLCALC 186 (H) 09/26/2015   ALT 13 09/15/2016   AST 38 (H) 09/15/2016   NA 136 09/15/2016   K 4.0 09/15/2016   CL 95 (L) 09/15/2016   CREATININE 1.31 (H) 09/15/2016   BUN 18 09/15/2016   CO2 33 (H) 09/15/2016   TSH 2.11 09/15/2016   INR 0.98 07/04/2015    Dg Chest 2  View  Result Date: 09/16/2016 CLINICAL DATA:  A mental status, cough, fatigue, decreased oxygen saturation for the past week and fever for 2 days. History of previous episodes of pneumonia. EXAM: CHEST  2 VIEW COMPARISON:  PA and lateral chest x-ray of August 14, 2016. FINDINGS: There has developed increased density in the right lower lobe. The right middle and upper lobes are clear. The left lung is clear. There is no pleural effusion. The heart and pulmonary vascularity are normal. There is calcification in the wall of the aortic arch. There is moderate dextrocurvature centered in the mid upper thoracic spine which is stable. IMPRESSION: Patchy alveolar pneumonia in the right lower lobe. Probable underlying chronic bronchitic change. Followup PA and  lateral chest X-ray is recommended in 3-4 weeks following trial of antibiotic therapy to ensure resolution and exclude underlying malignancy. Thoracic aortic atherosclerosis. Electronically Signed   By: David  Martinique M.D.   On: 09/16/2016 12:01    Assessment & Plan:   There are no diagnoses linked to this encounter. I have discontinued Ms. Mezera's ferrous sulfate. I am also having her maintain her oxymorphone, gabapentin, albuterol, diazepam, cholecalciferol, calcium carbonate, polyethylene glycol, acetaminophen, pantoprazole, atenolol, SUMAtriptan, traZODone, rosuvastatin, diltiazem, triamterene-hydrochlorothiazide, benzonatate, levofloxacin, PARoxetine, metroNIDAZOLE, and sulfamethoxazole-trimethoprim.  Meds ordered this encounter  Medications  . metroNIDAZOLE (FLAGYL) 500 MG tablet    Sig: Take by mouth.  . sulfamethoxazole-trimethoprim (BACTRIM DS,SEPTRA DS) 800-160 MG tablet    Sig: Take by mouth.     Follow-up: No Follow-up on file.  [name], MD

## 2016-11-06 NOTE — Assessment & Plan Note (Signed)
a rash  abx reaction: adm to [facility] for 7 d and d/c'd 1 week ago. Resolved

## 2016-11-06 NOTE — Assessment & Plan Note (Signed)
Repeat CXR

## 2016-11-09 DIAGNOSIS — T8142XA Infection following a procedure, deep incisional surgical site, initial encounter: Secondary | ICD-10-CM | POA: Insufficient documentation

## 2016-11-11 ENCOUNTER — Ambulatory Visit (INDEPENDENT_AMBULATORY_CARE_PROVIDER_SITE_OTHER): Payer: Medicare Other | Admitting: Family Medicine

## 2016-11-11 VITALS — BP 122/70 | HR 54 | Temp 98.3°F | Ht 65.5 in | Wt 139.0 lb

## 2016-11-11 DIAGNOSIS — S91002A Unspecified open wound, left ankle, initial encounter: Secondary | ICD-10-CM | POA: Diagnosis not present

## 2016-11-11 DIAGNOSIS — Z4802 Encounter for removal of sutures: Secondary | ICD-10-CM | POA: Diagnosis not present

## 2016-11-11 NOTE — Progress Notes (Signed)
Pre visit review using our clinic review tool, if applicable. No additional management support is needed unless otherwise documented below in the visit note. 

## 2016-11-11 NOTE — Patient Instructions (Signed)
Suture Removal, Care After Refer to this sheet in the next few weeks. These instructions provide you with information on caring for yourself after your procedure. Your health care provider may also give you more specific instructions. Your treatment has been planned according to current medical practices, but problems sometimes occur. Call your health care provider if you have any problems or questions after your procedure. What can I expect after the procedure? After your stitches (sutures) are removed, it is typical to have the following:  Some discomfort and swelling in the wound area.  Slight redness in the area.  Follow these instructions at home:  If you have skin adhesive strips over the wound area, do not take the strips off. They will fall off on their own in a few days. If the strips remain in place after 14 days, you may remove them.  Change any bandages (dressings) at least once a day or as directed by your health care provider. If the bandage sticks, soak it off with warm, soapy water.  Apply cream or ointment only as directed by your health care provider. If using cream or ointment, wash the area with soap and water 2 times a day to remove all the cream or ointment. Rinse off the soap and pat the area dry with a clean towel.  Keep the wound area dry and clean. If the bandage becomes wet or dirty, or if it develops a bad smell, change it as soon as possible.  Continue to protect the wound from injury.  Use sunscreen when out in the sun. New scars become sunburned easily. Contact a health care provider if:  You have increasing redness, swelling, or pain in the wound.  You see pus coming from the wound.  You have a fever.  You notice a bad smell coming from the wound or dressing.  Your wound breaks open (edges not staying together). This information is not intended to replace advice given to you by your health care provider. Make sure you discuss any questions you have  with your health care provider. Document Released: 01/13/2001 Document Revised: 09/26/2015 Document Reviewed: 11/30/2012 Elsevier Interactive Patient Education  2017 [employer]  

## 2016-11-11 NOTE — Progress Notes (Signed)
Subjective:    Patient ID: Bianca Haley, female    DOB: 1950-10-18, 66 y.o.   MRN: [id number]  HPI This is a 66 yo female, accompanied by her husband. She had surgery on her right foot, had complications and had to have hardware removed due to infection. She is currently on bactrim and metronidazole. Tolerating well. Has follow up appointments with surgeon and infectious disease. She has three sutures that were placed 10/22/16 and she presents today to have these removed. She has a a wound on her lateral left ankle that is bandaged, has had some drainage. Bandage last changed 2 days ago.   Past Medical History:  Diagnosis Date  . Anemia   . Anxiety   . Arthritis   . Back pain   . Chronic renal failure    patient denies   . GERD (gastroesophageal reflux disease)   . H/O tinnitus    left  . Headache(784.0)   . History of endometriosis   . History of uterine fibroid   . History of UTI   . Hypertension   . Migraine   . Recurrent pneumonia    Past Surgical History:  Procedure Laterality Date  . ABDOMINAL HYSTERECTOMY    . BACK SURGERY    . BLADDER SUSPENSION    . COLONOSCOPY  02/16/2008   normal  . laparoscopy     for evauation of endometriosis  . TOTAL KNEE ARTHROPLASTY Left 07/15/2015   Procedure: TOTAL LEFT KNEE ARTHROPLASTY;  Surgeon: Paralee Cancel, MD;  Location: [facility];  Service: Orthopedics;  Laterality: Left;   Family History  Problem Relation Age of Onset  . Emphysema Mother   . Heart disease Mother 74       MI   Social History  Substance Use Topics  . Smoking status: Never Smoker  . Smokeless tobacco: Never Used  . Alcohol use No     Comment: no drinking for sevral months      Review of Systems Per HPI    Objective:   Physical Exam  Constitutional: She is oriented to person, place, and time. She appears well-developed and well-nourished. No distress.  HENT:  Head: Normocephalic and atraumatic.  Eyes: Conjunctivae are normal.  Cardiovascular:  Normal rate.   Pulmonary/Chest: Effort normal.  Neurological: She is alert and oriented to person, place, and time.  Skin: Skin is warm and dry. She is not diaphoretic.  Left lateral ankle with 2 cm opening. Covered with steri strips. No erythema, small amount serous drainage on bandage. New bandage applied.  Left heel with 3 intact interrupted sutures. Edges well approximated. No drainage or erythema. Area cleaned with betadine and alcohol. Sutures removed without difficulty. No bleeding or drainage. Patient tolerated well.   Psychiatric: She has a normal mood and affect. Her behavior is normal. Judgment and thought content normal.  Vitals reviewed.     BP 122/70 (BP Location: Left Arm, Patient Position: Sitting, Cuff Size: Normal)   Pulse (!) 54   Temp 98.3 F (36.8 C) (Oral)   Ht 5' 5.5" (1.664 m)   Wt 139 lb (63 kg)   SpO2 98%   BMI 22.78 kg/m      Assessment & Plan:  1. Wound of left ankle, initial encounter - dressing applied - continue to monitor for erythema, drainage, fever  2. Visit for suture removal - 3 sutures removed from left heel without difficulty - Provided written and verbal information regarding diagnosis and treatment.   Clarene Reamer, FNP-BC   [facility]  11/11/2016 2:08 PM

## 2016-11-18 DIAGNOSIS — H35362 Drusen (degenerative) of macula, left eye: Secondary | ICD-10-CM | POA: Diagnosis not present

## 2016-11-18 DIAGNOSIS — H35341 Macular cyst, hole, or pseudohole, right eye: Secondary | ICD-10-CM | POA: Diagnosis not present

## 2016-11-18 DIAGNOSIS — H35371 Puckering of macula, right eye: Secondary | ICD-10-CM | POA: Diagnosis not present

## 2016-11-18 DIAGNOSIS — Z961 Presence of intraocular lens: Secondary | ICD-10-CM | POA: Diagnosis not present

## 2016-11-23 ENCOUNTER — Other Ambulatory Visit: Payer: Self-pay

## 2016-11-24 ENCOUNTER — Other Ambulatory Visit: Payer: Self-pay

## 2016-11-24 MED ORDER — TRAZODONE HCL 100 MG PO TABS: 100.0000 mg | ORAL_TABLET | Freq: Every day | ORAL | 1 refills | Status: DC

## 2016-11-27 ENCOUNTER — Other Ambulatory Visit: Payer: Self-pay

## 2016-11-27 MED ORDER — PANTOPRAZOLE SODIUM 40 MG PO TBEC: 40.0000 mg | DELAYED_RELEASE_TABLET | Freq: Two times a day (BID) | ORAL | 3 refills | Status: DC

## 2016-12-03 DIAGNOSIS — I10 Essential (primary) hypertension: Secondary | ICD-10-CM | POA: Diagnosis not present

## 2016-12-03 DIAGNOSIS — M5412 Radiculopathy, cervical region: Secondary | ICD-10-CM | POA: Diagnosis not present

## 2016-12-03 DIAGNOSIS — M5416 Radiculopathy, lumbar region: Secondary | ICD-10-CM | POA: Diagnosis not present

## 2016-12-03 DIAGNOSIS — M5136 Other intervertebral disc degeneration, lumbar region: Secondary | ICD-10-CM | POA: Diagnosis not present

## 2016-12-03 DIAGNOSIS — M542 Cervicalgia: Secondary | ICD-10-CM | POA: Diagnosis not present

## 2016-12-08 DIAGNOSIS — Z79899 Other long term (current) drug therapy: Secondary | ICD-10-CM | POA: Diagnosis not present

## 2016-12-08 DIAGNOSIS — T814XXD Infection following a procedure, subsequent encounter: Secondary | ICD-10-CM | POA: Diagnosis not present

## 2016-12-08 DIAGNOSIS — Z792 Long term (current) use of antibiotics: Secondary | ICD-10-CM | POA: Diagnosis not present

## 2016-12-08 DIAGNOSIS — M869 Osteomyelitis, unspecified: Secondary | ICD-10-CM | POA: Diagnosis not present

## 2016-12-15 DIAGNOSIS — L720 Epidermal cyst: Secondary | ICD-10-CM | POA: Diagnosis not present

## 2016-12-15 DIAGNOSIS — D225 Melanocytic nevi of trunk: Secondary | ICD-10-CM | POA: Diagnosis not present

## 2016-12-15 DIAGNOSIS — L821 Other seborrheic keratosis: Secondary | ICD-10-CM | POA: Diagnosis not present

## 2016-12-15 DIAGNOSIS — Z85828 Personal history of other malignant neoplasm of skin: Secondary | ICD-10-CM | POA: Diagnosis not present

## 2016-12-15 DIAGNOSIS — D1801 Hemangioma of skin and subcutaneous tissue: Secondary | ICD-10-CM | POA: Diagnosis not present

## 2016-12-18 DIAGNOSIS — G8918 Other acute postprocedural pain: Secondary | ICD-10-CM | POA: Diagnosis not present

## 2016-12-18 DIAGNOSIS — Z96652 Presence of left artificial knee joint: Secondary | ICD-10-CM | POA: Diagnosis present

## 2016-12-18 DIAGNOSIS — T8450XA Infection and inflammatory reaction due to unspecified internal joint prosthesis, initial encounter: Secondary | ICD-10-CM | POA: Diagnosis present

## 2016-12-18 DIAGNOSIS — R262 Difficulty in walking, not elsewhere classified: Secondary | ICD-10-CM | POA: Diagnosis not present

## 2016-12-18 DIAGNOSIS — Z79891 Long term (current) use of opiate analgesic: Secondary | ICD-10-CM | POA: Diagnosis not present

## 2016-12-18 DIAGNOSIS — G8929 Other chronic pain: Secondary | ICD-10-CM | POA: Diagnosis present

## 2016-12-18 DIAGNOSIS — Z981 Arthrodesis status: Secondary | ICD-10-CM | POA: Diagnosis not present

## 2016-12-18 DIAGNOSIS — I10 Essential (primary) hypertension: Secondary | ICD-10-CM | POA: Diagnosis not present

## 2016-12-18 DIAGNOSIS — K219 Gastro-esophageal reflux disease without esophagitis: Secondary | ICD-10-CM | POA: Diagnosis not present

## 2016-12-18 DIAGNOSIS — T814XXA Infection following a procedure, initial encounter: Secondary | ICD-10-CM | POA: Diagnosis not present

## 2016-12-23 ENCOUNTER — Ambulatory Visit: Payer: Medicare Other | Admitting: Internal Medicine

## 2016-12-27 ENCOUNTER — Emergency Department (HOSPITAL_COMMUNITY)
Admission: EM | Admit: 2016-12-27 | Discharge: 2016-12-27 | Disposition: A | Discharge status: Home / Self Care | Payer: Medicare Other | Attending: Emergency Medicine | Admitting: Emergency Medicine

## 2016-12-27 ENCOUNTER — Encounter (HOSPITAL_COMMUNITY): Payer: Self-pay

## 2016-12-27 DIAGNOSIS — T82524A Displacement of infusion catheter, initial encounter: Secondary | ICD-10-CM | POA: Diagnosis not present

## 2016-12-27 DIAGNOSIS — Z96652 Presence of left artificial knee joint: Secondary | ICD-10-CM | POA: Insufficient documentation

## 2016-12-27 DIAGNOSIS — Z79899 Other long term (current) drug therapy: Secondary | ICD-10-CM | POA: Insufficient documentation

## 2016-12-27 DIAGNOSIS — I5030 Unspecified diastolic (congestive) heart failure: Secondary | ICD-10-CM | POA: Diagnosis not present

## 2016-12-27 DIAGNOSIS — Y828 Other medical devices associated with adverse incidents: Secondary | ICD-10-CM | POA: Insufficient documentation

## 2016-12-27 DIAGNOSIS — Z452 Encounter for adjustment and management of vascular access device: Secondary | ICD-10-CM | POA: Diagnosis present

## 2016-12-27 DIAGNOSIS — N189 Chronic kidney disease, unspecified: Secondary | ICD-10-CM | POA: Diagnosis not present

## 2016-12-27 DIAGNOSIS — I130 Hypertensive heart and chronic kidney disease with heart failure and stage 1 through stage 4 chronic kidney disease, or unspecified chronic kidney disease: Secondary | ICD-10-CM | POA: Insufficient documentation

## 2016-12-27 DIAGNOSIS — T82598A Other mechanical complication of other cardiac and vascular devices and implants, initial encounter: Secondary | ICD-10-CM | POA: Insufficient documentation

## 2016-12-27 DIAGNOSIS — T82528A Displacement of other cardiac and vascular devices and implants, initial encounter: Secondary | ICD-10-CM

## 2016-12-27 MED ORDER — SODIUM CHLORIDE 0.9% FLUSH: 10.0000 mL | Freq: Two times a day (BID) | INTRAVENOUS | Status: DC

## 2016-12-27 MED ORDER — SODIUM CHLORIDE 0.9% FLUSH: 10.0000 mL | INTRAVENOUS | Status: DC | PRN

## 2016-12-27 NOTE — ED Triage Notes (Signed)
Onset during night pts picc line got pulled out.  Pt brought picc line with her.  Pt has bandaid over picc line site.  No redness noted in left upper arm.

## 2016-12-27 NOTE — ED Notes (Signed)
The pt is waiting for the iv team to replace her picc line  Husband at the bedside  noi complaints all her monitor pulse os and bp ciuff are all off she reports that  They took them off  ///

## 2016-12-27 NOTE — ED Notes (Signed)
ED Provider at bedside. 

## 2016-12-27 NOTE — Discharge Instructions (Signed)
As discussed, your evaluation today has been largely reassuring.  But, it is important that you monitor your condition carefully, and do not hesitate to return to the ED if you develop new, or concerning changes in your condition. ? ?Otherwise, please follow-up with your physician for appropriate ongoing care. ? ?

## 2016-12-27 NOTE — Progress Notes (Signed)
Peripherally Inserted Central Catheter/Midline Placement  The IV Nurse has discussed with the patient and/or persons authorized to consent for the patient, the purpose of this procedure and the potential benefits and risks involved with this procedure.  The benefits include less needle sticks, lab draws from the catheter, and the patient may be discharged home with the catheter. Risks include, but not limited to, infection, bleeding, blood clot (thrombus formation), and puncture of an artery; nerve damage and irregular heartbeat and possibility to perform a PICC exchange if needed/ordered by physician.  Alternatives to this procedure were also discussed.  Bard Power PICC patient education guide, fact sheet on infection prevention and patient information card has been provided to patient /or left at bedside.    PICC/Midline Placement Documentation  PICC Single Lumen 81/85/63 PICC Right Basilic 34 cm 0 cm (Active)  Indication for Insertion or Continuance of Line Home intravenous therapies (PICC only) 12/27/2016  5:03 PM  Exposed Catheter (cm) 0 cm 12/27/2016  5:03 PM  Site Assessment Clean;Dry;Intact 12/27/2016  5:03 PM  Line Status Flushed;Saline locked;Blood return noted 12/27/2016  5:03 PM  Dressing Type Transparent 12/27/2016  5:03 PM  Dressing Status Clean;Dry;Intact 12/27/2016  5:03 PM  Dressing Change Due 01/03/17 12/27/2016  5:03 PM       [facility]ordan Payment 12/27/2016, 5:04 PM

## 2016-12-27 NOTE — ED Notes (Signed)
Needles from her lt thigh removed and bandaged

## 2016-12-27 NOTE — ED Notes (Signed)
IV team called and advised it would be a few hours before they could come place the PICC line.

## 2016-12-27 NOTE — ED Provider Notes (Signed)
[place] DEPT Provider Note   CSN: [id number] Arrival date & time: 12/27/16  1140     History   Chief Complaint Chief Complaint  Patient presents with  . Vascular Access Problem    HPI Bianca Haley is a 66 y.o. female.  HPI  This patient presents due to dislodged PICC line. She was receiving continued IV therapy, antifungal, forsystemic infection. She has multiple medical problems, including recent failed orthopedic procedure on her left foot, which she is also receiving transcutaneous analgesia. Today, the patient awoke, and noticed that her PICC line was dislodged. Nobleeding from the site, no other complaints, no new fever, chills other complaints. Patient spoke with her infectious disease physician, was referred here for placement of her PICC line.  Past Medical History:  Diagnosis Date  . Anemia   . Anxiety   . Arthritis   . Back pain   . Chronic renal failure    patient denies   . GERD (gastroesophageal reflux disease)   . H/O tinnitus    left  . Headache(784.0)   . History of endometriosis   . History of uterine fibroid   . History of UTI   . Hypertension   . Migraine   . Recurrent pneumonia     Patient Active Problem List   Diagnosis Date Noted  . Deep incisional surgical site infection 11/09/2016  . Allergic reaction caused by a drug 11/06/2016  . Fever 09/16/2016  . Body aches 09/16/2016  . Confusion 09/16/2016  . Cough 09/16/2016  . Edema 09/15/2016  . Upper respiratory infection 08/14/2016  . Urinary tract infection 08/14/2016  . Cerumen impaction 06/11/2016  . Posterior tibial tendon dysfunction (PTTD) of left lower extremity 04/13/2016  . Dyslipidemia 09/25/2015  . S/P left TKA 07/15/2015  . S/P knee replacement 07/15/2015  . Esophageal dysphagia 09/18/2014  . GERD (gastroesophageal reflux disease) 09/04/2014  . Chronic narcotic dependence ([place]) 09/04/2014  . Gross hematuria 08/31/2014  . Hemorrhagic cystitis 08/24/2014  .  Bilateral lower extremity edema 04/23/2014  . Osteoarthritis of left knee 10/25/2013  . Knee pain, left 10/13/2013  . Left leg swelling 10/13/2013  . Chronic neck and back pain 09/28/2013  . Hypokalemia 09/28/2013  . Abnormal CT scan, head 09/27/2013  . Diastolic dysfunction- grade I 02/24/2013  . Recurrent pneumonia 02/21/2013  . Chronic cough 01/04/2013  . Contracture of palmar fascia (Dupuytren's) 08/04/2011  . VENOUS INSUFFICIENCY, LEFT LEG 05/14/2010  . CRF (chronic renal failure) 12/02/2009  . INGUINAL PAIN, RIGHT 12/02/2009  . UNSPECIFIED ANEMIA 11/28/2009  . PULMONARY NODULE 05/10/2008  . FIBROIDS, UTERUS 08/03/2007  . Anxiety state 08/03/2007  . MIGRAINE HEADACHE 08/03/2007  . TINNITUS, LEFT 08/03/2007  . Essential hypertension 08/03/2007  . ENDOMETRIOSIS 08/03/2007  . ALCOHOL ABUSE, HX OF 08/03/2007  . MEASLES, HX OF 08/03/2007  . Personal history of urinary disorder 08/03/2007    Past Surgical History:  Procedure Laterality Date  . ABDOMINAL HYSTERECTOMY    . BACK SURGERY    . BLADDER SUSPENSION    . COLONOSCOPY  02/16/2008   normal  . laparoscopy     for evauation of endometriosis  . TOTAL KNEE ARTHROPLASTY Left 07/15/2015   Procedure: TOTAL LEFT KNEE ARTHROPLASTY;  Surgeon: Paralee Cancel, MD;  Location: [facility];  Service: Orthopedics;  Laterality: Left;    OB History    Gravida Para Term Preterm AB Living   2 2 2    0 2   SAB TAB Ectopic Multiple Live Births   0  0 0 0         Home Medications    Prior to Admission medications   Medication Sig Start Date End Date Taking? Authorizing Provider  acetaminophen (TYLENOL) 325 MG tablet Take 1-2 tablets (325-650 mg total) by mouth every 6 (six) hours as needed for mild pain or moderate pain. 07/18/15   Danae Orleans, PA-C  albuterol (PROVENTIL HFA;VENTOLIN HFA) 108 (90 BASE) MCG/ACT inhaler 2 puffs every 4-6 hours as needed for wheezing/shortness of breath 06/19/14   Parrett, Tammy S, NP  atenolol (TENORMIN)  100 MG tablet TAKE 1 TABLET BY MOUTH EVERY DAY 11/12/15   Plotnikov, [name], MD  benzonatate (TESSALON) 200 MG capsule Take 1 capsule (200 mg total) by mouth 3 (three) times daily as needed for cough. 09/16/16   Binnie Rail, MD  calcium carbonate (OS-CAL - DOSED IN MG OF ELEMENTAL CALCIUM) 1250 (500 Ca) MG tablet Take 1 tablet by mouth daily with breakfast.    [provider]  cholecalciferol (VITAMIN D) 1000 units tablet Take 5,000 Units by mouth daily.    [provider]  diazepam (VALIUM) 5 MG tablet Take 5 mg by mouth every 12 (twelve) hours as needed for anxiety or muscle spasms.  08/07/14   [provider]  diltiazem (CARDIZEM CD) 120 MG 24 hr capsule Take 1 capsule (120 mg total) by mouth daily. 09/15/16   Plotnikov, [name], MD  gabapentin (NEURONTIN) 300 MG capsule Take 1-3 capsules by mouth See admin instructions. Take 300 mg in the morning and 900 mg at bedtime. May take as needed throughout the day for nerve pain 01/09/14   [provider]  levofloxacin (LEVAQUIN) 750 MG tablet Take 1 tablet (750 mg total) by mouth daily. 09/16/16   Binnie Rail, MD  oxymorphone (OPANA) 10 MG tablet Take 10 mg by mouth See admin instructions. Reported on 09/25/2015 09/29/13   Samella Parr, NP  pantoprazole (PROTONIX) 40 MG tablet Take 1 tablet (40 mg total) by mouth 2 (two) times daily before a meal. 11/27/16   Plotnikov, [name], MD  PARoxetine (PAXIL) 40 MG tablet Take 1 tablet (40 mg total) by mouth 2 (two) times daily. 09/23/16   Plotnikov, [name], MD  polyethylene glycol (MIRALAX / GLYCOLAX) packet Take 17 g by mouth 2 (two) times daily. 07/16/15   Danae Orleans, PA-C  rosuvastatin (CRESTOR) 10 MG tablet Take 1 tablet (10 mg total) by mouth daily. 08/05/16   Plotnikov, [name], MD  SUMAtriptan (IMITREX) 100 MG tablet Take 1 tablet (100 mg total) by mouth once. May repeat in 2 hours if headache persists or recurs. 02/12/16 02/12/16  Plotnikov, [name], MD    traZODone (DESYREL) 100 MG tablet Take 1 tablet (100 mg total) by mouth at bedtime. 11/24/16   Plotnikov, [name], MD  triamterene-hydrochlorothiazide (MAXZIDE-25) 37.5-25 MG tablet Take 1 tablet by mouth daily. 09/15/16 09/15/17  Plotnikov, [name], MD    Family History Family History  Problem Relation Age of Onset  . Emphysema Mother   . Heart disease Mother 6       MI    Social History Social History  Substance Use Topics  . Smoking status: Never Smoker  . Smokeless tobacco: Never Used  . Alcohol use No     Comment: no drinking for sevral months     Allergies   Penicillins; Morphine and related; Cefepime; and Vancomycin   Review of Systems Review of Systems  Constitutional:  Per HPI, otherwise negative  HENT:       Per HPI, otherwise negative  Respiratory:       Per HPI, otherwise negative  Cardiovascular:       Per HPI, otherwise negative  Gastrointestinal: Negative for vomiting.  Endocrine:       Negative aside from HPI  Genitourinary:       Neg aside from HPI   Musculoskeletal:       Per HPI, otherwise negative  Skin: Negative.   Allergic/Immunologic:       Per history of present illness  Neurological: Negative for syncope.     Physical Exam Updated Vital Signs BP 110/62   Pulse 62   Temp 98.2 F (36.8 C) (Oral)   Resp 16   SpO2 96%   Physical Exam  Constitutional: She is oriented to person, place, and time. She appears well-developed and well-nourished. No distress.  HENT:  Head: Normocephalic and atraumatic.  Eyes: Conjunctivae and EOM are normal.  Pulmonary/Chest: Effort normal. No stridor. No respiratory distress.  Musculoskeletal: She exhibits no edema.       Arms:      Feet:  Neurological: She is alert and oriented to person, place, and time. No cranial nerve deficit.  Skin: Skin is warm and dry.  Psychiatric: She has a normal mood and affect.  Nursing note and vitals reviewed.    ED Treatments / Results    Procedures Procedures (including critical care time)  Medications Ordered in ED Medications - No data to display   Initial Impression / Assessment and Plan / ED Course  I have reviewed the triage vital signs and the nursing notes.  Pertinent labs & imaging results that were available during my care of the patient were reviewed by me and considered in my medical decision making (see chart for details).  After the initial evaluation I reviewed the patient's paper chart from [facility]. Patient is scheduled to receive micafungin daily for 8 weeks. Patient has not yet received today's dose pain Subsequently I discussed patient's case with our PICC line team, and the patient will receive a PICC, and if this is performed, without complication, be discharged to follow-up with her physicians at [facility]. Physical exam otherwise reassuring, vital signs reassuring.   Final Clinical Impressions(s) / ED Diagnoses  Central Line Complication   Carmin Muskrat, MD 12/27/16 1511

## 2016-12-31 ENCOUNTER — Other Ambulatory Visit: Payer: Self-pay

## 2016-12-31 MED ORDER — ROSUVASTATIN CALCIUM 10 MG PO TABS
10.0000 mg | ORAL_TABLET | Freq: Every day | ORAL | 1 refills | Status: DC
Start: 2016-12-31 — End: 2017-03-04

## 2017-01-04 ENCOUNTER — Other Ambulatory Visit (HOSPITAL_COMMUNITY)
Admission: RE | Admit: 2017-01-04 | Discharge: 2017-01-04 | Disposition: A | Discharge status: Home / Self Care | Payer: Medicare Other | Source: Other Acute Inpatient Hospital | Attending: Orthopaedic Surgery | Admitting: Orthopaedic Surgery

## 2017-01-04 DIAGNOSIS — T814XXA Infection following a procedure, initial encounter: Secondary | ICD-10-CM | POA: Diagnosis not present

## 2017-01-04 LAB — HEPATIC FUNCTION PANEL
ALBUMIN: 3.5 g/dL (ref 3.5–5.0)
ALT: 18 U/L (ref 14–54)
AST: 25 U/L (ref 15–41)
Alkaline Phosphatase: 73 U/L (ref 38–126)
Bilirubin, Direct: <0.1 mg/dL — ABNORMAL LOW (ref 0.1–0.5)
TOTAL PROTEIN: 6.2 g/dL — AB (ref 6.5–8.1)
Total Bilirubin: 0.5 mg/dL (ref 0.3–1.2)

## 2017-01-05 ENCOUNTER — Telehealth: Payer: Self-pay | Admitting: Internal Medicine

## 2017-01-05 MED ORDER — ATENOLOL 100 MG PO TABS: ORAL_TABLET | ORAL | 1 refills | Status: DC

## 2017-01-05 NOTE — Telephone Encounter (Signed)
Pt called for a refill of her atenolol (TENORMIN) 100 MG tablet  Had hosp FU on 11/2016 Please advise

## 2017-01-05 NOTE — Telephone Encounter (Signed)
Reviewed chart pt is up-to-date sent refills to [facility].../lmb  

## 2017-01-06 ENCOUNTER — Other Ambulatory Visit: Payer: Self-pay

## 2017-01-06 DIAGNOSIS — J189 Pneumonia, unspecified organism: Secondary | ICD-10-CM

## 2017-01-06 MED ORDER — ALBUTEROL SULFATE HFA 108 (90 BASE) MCG/ACT IN AERS: 2.0000 | INHALATION_SPRAY | RESPIRATORY_TRACT | 2 refills | Status: DC | PRN

## 2017-01-11 ENCOUNTER — Other Ambulatory Visit (HOSPITAL_COMMUNITY)
Admission: RE | Admit: 2017-01-11 | Discharge: 2017-01-11 | Disposition: A | Discharge status: Home / Self Care | Payer: Medicare Other | Source: Other Acute Inpatient Hospital | Attending: Orthopaedic Surgery | Admitting: Orthopaedic Surgery

## 2017-01-11 DIAGNOSIS — T814XXA Infection following a procedure, initial encounter: Secondary | ICD-10-CM | POA: Insufficient documentation

## 2017-01-11 DIAGNOSIS — X58XXXA Exposure to other specified factors, initial encounter: Secondary | ICD-10-CM | POA: Diagnosis not present

## 2017-01-11 LAB — HEPATIC FUNCTION PANEL
ALK PHOS: 74 U/L (ref 38–126)
ALT: 28 U/L (ref 14–54)
AST: 39 U/L (ref 15–41)
Albumin: 3.7 g/dL (ref 3.5–5.0)
BILIRUBIN DIRECT: 0.1 mg/dL (ref 0.1–0.5)
BILIRUBIN TOTAL: 0.4 mg/dL (ref 0.3–1.2)
Indirect Bilirubin: 0.3 mg/dL (ref 0.3–0.9)
Total Protein: 6.1 g/dL — ABNORMAL LOW (ref 6.5–8.1)

## 2017-01-19 ENCOUNTER — Other Ambulatory Visit (HOSPITAL_COMMUNITY)
Admission: RE | Admit: 2017-01-19 | Discharge: 2017-01-19 | Disposition: A | Discharge status: Home / Self Care | Payer: Medicare Other | Source: Other Acute Inpatient Hospital | Attending: Internal Medicine | Admitting: Internal Medicine

## 2017-01-19 DIAGNOSIS — T814XXA Infection following a procedure, initial encounter: Secondary | ICD-10-CM | POA: Diagnosis not present

## 2017-01-19 DIAGNOSIS — Y839 Surgical procedure, unspecified as the cause of abnormal reaction of the patient, or of later complication, without mention of misadventure at the time of the procedure: Secondary | ICD-10-CM | POA: Diagnosis not present

## 2017-01-19 LAB — HEPATIC FUNCTION PANEL
ALBUMIN: 4.2 g/dL (ref 3.5–5.0)
ALK PHOS: 87 U/L (ref 38–126)
ALT: 28 U/L (ref 14–54)
AST: 40 U/L (ref 15–41)
BILIRUBIN INDIRECT: 0.3 mg/dL (ref 0.3–0.9)
BILIRUBIN TOTAL: 0.4 mg/dL (ref 0.3–1.2)
Bilirubin, Direct: 0.1 mg/dL (ref 0.1–0.5)
TOTAL PROTEIN: 6.6 g/dL (ref 6.5–8.1)

## 2017-01-25 ENCOUNTER — Other Ambulatory Visit (HOSPITAL_COMMUNITY)
Admission: RE | Admit: 2017-01-25 | Discharge: 2017-01-25 | Disposition: A | Discharge status: Home / Self Care | Payer: Medicare Other | Source: Other Acute Inpatient Hospital | Attending: Infectious Diseases | Admitting: Infectious Diseases

## 2017-01-25 DIAGNOSIS — T814XXA Infection following a procedure, initial encounter: Secondary | ICD-10-CM | POA: Insufficient documentation

## 2017-01-25 DIAGNOSIS — Y839 Surgical procedure, unspecified as the cause of abnormal reaction of the patient, or of later complication, without mention of misadventure at the time of the procedure: Secondary | ICD-10-CM | POA: Diagnosis not present

## 2017-01-25 LAB — CBC WITH DIFFERENTIAL/PLATELET
Basophils Absolute: 0 10*3/uL (ref 0.0–0.1)
Basophils Relative: 1 %
EOS ABS: 0.3 10*3/uL (ref 0.0–0.7)
EOS PCT: 7 %
HCT: 33.9 % — ABNORMAL LOW (ref 36.0–46.0)
Hemoglobin: 11.3 g/dL — ABNORMAL LOW (ref 12.0–15.0)
LYMPHS ABS: 1.6 10*3/uL (ref 0.7–4.0)
Lymphocytes Relative: 38 %
MCH: 28.5 pg (ref 26.0–34.0)
MCHC: 33.3 g/dL (ref 30.0–36.0)
MCV: 85.6 fL (ref 78.0–100.0)
MONO ABS: 0.5 10*3/uL (ref 0.1–1.0)
Monocytes Relative: 11 %
Neutro Abs: 1.9 10*3/uL (ref 1.7–7.7)
Neutrophils Relative %: 43 %
PLATELETS: 228 10*3/uL (ref 150–400)
RBC: 3.96 MIL/uL (ref 3.87–5.11)
RDW: 15.7 % — AB (ref 11.5–15.5)
WBC: 4.3 10*3/uL (ref 4.0–10.5)

## 2017-01-25 LAB — HEPATIC FUNCTION PANEL
ALT: 22 U/L (ref 14–54)
AST: 32 U/L (ref 15–41)
Albumin: 3.7 g/dL (ref 3.5–5.0)
Alkaline Phosphatase: 81 U/L (ref 38–126)
TOTAL PROTEIN: 6.3 g/dL — AB (ref 6.5–8.1)
Total Bilirubin: 0.6 mg/dL (ref 0.3–1.2)

## 2017-02-01 ENCOUNTER — Other Ambulatory Visit (HOSPITAL_COMMUNITY)
Admission: RE | Admit: 2017-02-01 | Discharge: 2017-02-01 | Disposition: A | Discharge status: Home / Self Care | Payer: Medicare Other | Source: Other Acute Inpatient Hospital | Attending: Orthopaedic Surgery | Admitting: Orthopaedic Surgery

## 2017-02-01 DIAGNOSIS — L7682 Other postprocedural complications of skin and subcutaneous tissue: Secondary | ICD-10-CM | POA: Diagnosis not present

## 2017-02-01 LAB — HEPATIC FUNCTION PANEL
ALT: 31 U/L (ref 14–54)
AST: 42 U/L — AB (ref 15–41)
Albumin: 3.8 g/dL (ref 3.5–5.0)
Alkaline Phosphatase: 85 U/L (ref 38–126)
BILIRUBIN DIRECT: 0.1 mg/dL (ref 0.1–0.5)
BILIRUBIN INDIRECT: 0 mg/dL — AB (ref 0.3–0.9)
BILIRUBIN TOTAL: 0.1 mg/dL — AB (ref 0.3–1.2)
Total Protein: 6.5 g/dL (ref 6.5–8.1)

## 2017-02-08 ENCOUNTER — Ambulatory Visit (INDEPENDENT_AMBULATORY_CARE_PROVIDER_SITE_OTHER): Payer: Medicare Other | Admitting: Internal Medicine

## 2017-02-08 ENCOUNTER — Other Ambulatory Visit (HOSPITAL_COMMUNITY)
Admission: RE | Admit: 2017-02-08 | Discharge: 2017-02-08 | Disposition: A | Discharge status: Home / Self Care | Payer: Medicare Other | Source: Other Acute Inpatient Hospital | Attending: Orthopaedic Surgery | Admitting: Orthopaedic Surgery

## 2017-02-08 ENCOUNTER — Encounter: Payer: Self-pay | Admitting: Internal Medicine

## 2017-02-08 VITALS — BP 112/72 | HR 51 | Temp 97.6°F | Ht 65.5 in | Wt 139.0 lb

## 2017-02-08 DIAGNOSIS — E785 Hyperlipidemia, unspecified: Secondary | ICD-10-CM

## 2017-02-08 DIAGNOSIS — L089 Local infection of the skin and subcutaneous tissue, unspecified: Secondary | ICD-10-CM | POA: Diagnosis not present

## 2017-02-08 DIAGNOSIS — T8140XA Infection following a procedure, unspecified, initial encounter: Secondary | ICD-10-CM | POA: Insufficient documentation

## 2017-02-08 DIAGNOSIS — Z23 Encounter for immunization: Secondary | ICD-10-CM | POA: Diagnosis not present

## 2017-02-08 DIAGNOSIS — I10 Essential (primary) hypertension: Secondary | ICD-10-CM

## 2017-02-08 DIAGNOSIS — X58XXXA Exposure to other specified factors, initial encounter: Secondary | ICD-10-CM | POA: Insufficient documentation

## 2017-02-08 LAB — HEPATIC FUNCTION PANEL
ALBUMIN: 3.8 g/dL (ref 3.5–5.0)
ALT: 19 U/L (ref 14–54)
AST: 30 U/L (ref 15–41)
Alkaline Phosphatase: 85 U/L (ref 38–126)
Bilirubin, Direct: <0.1 mg/dL — ABNORMAL LOW (ref 0.1–0.5)
TOTAL PROTEIN: 6.5 g/dL (ref 6.5–8.1)
Total Bilirubin: 0.4 mg/dL (ref 0.3–1.2)

## 2017-02-08 NOTE — Patient Instructions (Addendum)
Stop Maxzide (Triamt/HCTZ) for now  We can arrange for home PT

## 2017-02-08 NOTE — Progress Notes (Signed)
Subjective:  Patient ID: Bianca Haley, female    DOB: Jul 04, 1950  Age: 66 y.o. MRN: [id number]  CC: No chief complaint on file.   HPI Bianca Haley presents for HTN, dyslipidemia and L foot infection f/u. Has PICC line. On IV abx x12 weeks total at home - Mycamine  Outpatient Medications Prior to Visit  Medication Sig Dispense Refill  . acetaminophen (TYLENOL) 325 MG tablet Take 1-2 tablets (325-650 mg total) by mouth every 6 (six) hours as needed for mild pain or moderate pain.    [name] albuterol (PROVENTIL HFA;VENTOLIN HFA) 108 (90 Base) MCG/ACT inhaler Inhale 2 puffs into the lungs every 4 (four) hours as needed for wheezing or shortness of breath. 18 g 2  . aspirin 325 MG tablet Take 325 mg by mouth 2 (two) times daily.    [name] atenolol (TENORMIN) 100 MG tablet TAKE 1 TABLET (100 MG) BY MOUTH EVERY DAY 90 tablet 1  . Cholecalciferol (VITAMIN D) 2000 units tablet Take 2,000 Units by mouth daily.    . diazepam (VALIUM) 5 MG tablet Take 5 mg by mouth every 12 (twelve) hours as needed for anxiety or muscle spasms.   2  . diltiazem (CARDIZEM CD) 180 MG 24 hr capsule Take 180 mg by mouth daily.    [name] gabapentin (NEURONTIN) 300 MG capsule Take 600 capsules by mouth 3 (three) times daily.     . micafungin (MYCAMINE) 100 MG SOLR injection Inject 100 mg into the vein See admin instructions. Administer 100 mg via picc line daily at lunch - start date 12/22/16, end date 03/16/17    . Multiple Vitamin (MULTIVITAMIN WITH MINERALS) TABS tablet Take 1 tablet by mouth daily.    [name] OVER THE COUNTER MEDICATION Place 1 application into both eyes at bedtime as needed (dry eyes). Over the counter lubricant eye gel    . oxyCODONE (OXY IR/ROXICODONE) 5 MG immediate release tablet Take 5 mg by mouth every 4 (four) hours as needed (pain).   0  . oxymorphone (OPANA) 10 MG tablet Take 5 mg by mouth 2 (two) times daily. Reported on 09/25/2015    . pantoprazole (PROTONIX) 40 MG tablet Take 1 tablet (40 mg total) by mouth  2 (two) times daily before a meal. 180 tablet 3  . PARoxetine (PAXIL) 40 MG tablet Take 1 tablet (40 mg total) by mouth 2 (two) times daily. 180 tablet 3  . polyethylene glycol (MIRALAX / GLYCOLAX) packet Take 17 g by mouth 2 (two) times daily. (Patient taking differently: Take 17 g by mouth daily as needed (constipation). Mix in 8 oz liquid and drink) 14 each 0  . rosuvastatin (CRESTOR) 10 MG tablet Take 1 tablet (10 mg total) by mouth daily. 30 tablet 1  . SUMAtriptan (IMITREX) 100 MG tablet Take 100 mg by mouth See admin instructions. Take 1 tablet (100 mg) by mouth as needed for migraine, May repeat in 2 hours if headache persists or recurs.    . traZODone (DESYREL) 100 MG tablet Take 1 tablet (100 mg total) by mouth at bedtime. (Patient taking differently: Take 50 mg by mouth at bedtime. ) 90 tablet 1  . triamterene-hydrochlorothiazide (MAXZIDE-25) 37.5-25 MG tablet Take 1 tablet by mouth daily. 90 tablet 3  . benzonatate (TESSALON) 100 MG capsule Take 100 mg by mouth every 8 (eight) hours. 7 day course started 12/23/16  0  . benzonatate (TESSALON) 200 MG capsule Take 1 capsule (200 mg total) by mouth 3 (three) times daily as  needed for cough. (Patient not taking: Reported on 12/27/2016) 42 capsule 0  . diltiazem (CARDIZEM CD) 120 MG 24 hr capsule Take 1 capsule (120 mg total) by mouth daily. (Patient not taking: Reported on 12/27/2016) 90 capsule 3   No facility-administered medications prior to visit.     ROS Review of Systems  Constitutional: Positive for fatigue. Negative for activity change, appetite change, chills and unexpected weight change.  HENT: Negative for congestion, mouth sores and sinus pressure.   Eyes: Negative for visual disturbance.  Respiratory: Negative for cough and chest tightness.   Gastrointestinal: Negative for abdominal pain and nausea.  Genitourinary: Negative for difficulty urinating, frequency and vaginal pain.  Musculoskeletal: Positive for gait problem.  Negative for back pain.  Skin: Negative for pallor and rash.  Neurological: Negative for dizziness, tremors, weakness, numbness and headaches.  Psychiatric/Behavioral: Negative for confusion and sleep disturbance.    Objective:  BP 112/72 (BP Location: Left Arm, Patient Position: Sitting, Cuff Size: Normal)   Pulse (!) 51   Temp 97.6 F (36.4 C) (Oral)   Ht 5' 5.5" (1.664 m)   Wt 139 lb (63 kg)   SpO2 96%   BMI 22.78 kg/m   BP Readings from Last 3 Encounters:  02/08/17 112/72  12/27/16 116/62  11/11/16 122/70    Wt Readings from Last 3 Encounters:  02/08/17 139 lb (63 kg)  11/11/16 139 lb (63 kg)  11/06/16 138 lb (62.6 kg)    Physical Exam  Constitutional: She appears well-developed. No distress.  HENT:  Head: Normocephalic.  Right Ear: External ear normal.  Left Ear: External ear normal.  Nose: Nose normal.  Mouth/Throat: Oropharynx is clear and moist.  Eyes: Pupils are equal, round, and reactive to light. Conjunctivae are normal. Right eye exhibits no discharge. Left eye exhibits no discharge.  Neck: Normal range of motion. Neck supple. No JVD present. No tracheal deviation present. No thyromegaly present.  Cardiovascular: Normal rate, regular rhythm and normal heart sounds.   Pulmonary/Chest: No stridor. No respiratory distress. She has no wheezes.  Abdominal: Soft. Bowel sounds are normal. She exhibits no distension and no mass. There is no tenderness. There is no rebound and no guarding.  Musculoskeletal: She exhibits tenderness. She exhibits no edema.  Lymphadenopathy:    She has no cervical adenopathy.  Neurological: She displays normal reflexes. No cranial nerve deficit. She exhibits normal muscle tone. Coordination normal.  Skin: No rash noted. No erythema.  Psychiatric: She has a normal mood and affect. Her behavior is normal. Judgment and thought content normal.    Lab Results  Component Value Date   WBC 4.3 01/25/2017   HGB 11.3 (L) 01/25/2017   HCT  33.9 (L) 01/25/2017   PLT 228 01/25/2017   GLUCOSE 100 (H) 09/15/2016   CHOL 262 (H) 09/26/2015   TRIG 71.0 09/26/2015   HDL 61.60 09/26/2015   LDLCALC 186 (H) 09/26/2015   ALT 19 02/08/2017   AST 30 02/08/2017   NA 136 09/15/2016   K 4.0 09/15/2016   CL 95 (L) 09/15/2016   CREATININE 1.31 (H) 09/15/2016   BUN 18 09/15/2016   CO2 33 (H) 09/15/2016   TSH 2.11 09/15/2016   INR 0.98 07/04/2015    No results found.  Assessment & Plan:   There are no diagnoses linked to this encounter. I have discontinued Ms. Calvin's benzonatate and benzonatate. I am also having her maintain her oxymorphone, gabapentin, diazepam, polyethylene glycol, acetaminophen, triamterene-hydrochlorothiazide, PARoxetine, traZODone, pantoprazole, oxyCODONE, aspirin, Vitamin D,  diltiazem, multivitamin with minerals, micafungin, SUMAtriptan, OVER THE COUNTER MEDICATION, rosuvastatin, atenolol, and albuterol.  No orders of the defined types were placed in this encounter.    Follow-up: No Follow-up on file.  [name], MD

## 2017-02-08 NOTE — Assessment & Plan Note (Signed)
-   Crestor 

## 2017-02-08 NOTE — Assessment & Plan Note (Signed)
Atenolol, Diltiazem Maxzide  

## 2017-02-08 NOTE — Assessment & Plan Note (Signed)
Mycamine IV x12 weeks

## 2017-02-10 DIAGNOSIS — Z452 Encounter for adjustment and management of vascular access device: Secondary | ICD-10-CM | POA: Diagnosis not present

## 2017-02-10 DIAGNOSIS — Z792 Long term (current) use of antibiotics: Secondary | ICD-10-CM | POA: Diagnosis not present

## 2017-02-10 DIAGNOSIS — M869 Osteomyelitis, unspecified: Secondary | ICD-10-CM | POA: Diagnosis not present

## 2017-02-10 NOTE — Addendum Note (Signed)
Addended by: Karren Cobble on: 02/10/2017 03:46 PM   Modules accepted: Orders

## 2017-02-15 ENCOUNTER — Other Ambulatory Visit (HOSPITAL_COMMUNITY)
Admission: RE | Admit: 2017-02-15 | Discharge: 2017-02-15 | Disposition: A | Discharge status: Home / Self Care | Payer: Medicare Other | Source: Other Acute Inpatient Hospital | Attending: Orthopaedic Surgery | Admitting: Orthopaedic Surgery

## 2017-02-15 DIAGNOSIS — T8140XA Infection following a procedure, unspecified, initial encounter: Secondary | ICD-10-CM | POA: Diagnosis not present

## 2017-02-15 DIAGNOSIS — Y839 Surgical procedure, unspecified as the cause of abnormal reaction of the patient, or of later complication, without mention of misadventure at the time of the procedure: Secondary | ICD-10-CM | POA: Diagnosis not present

## 2017-02-15 LAB — HEPATIC FUNCTION PANEL
ALBUMIN: 3.5 g/dL (ref 3.5–5.0)
ALT: 15 U/L (ref 14–54)
AST: 35 U/L (ref 15–41)
Alkaline Phosphatase: 86 U/L (ref 38–126)
BILIRUBIN INDIRECT: 0.4 mg/dL (ref 0.3–0.9)
Bilirubin, Direct: 0.1 mg/dL (ref 0.1–0.5)
TOTAL PROTEIN: 5.9 g/dL — AB (ref 6.5–8.1)
Total Bilirubin: 0.5 mg/dL (ref 0.3–1.2)

## 2017-02-17 DIAGNOSIS — M76822 Posterior tibial tendinitis, left leg: Secondary | ICD-10-CM | POA: Diagnosis not present

## 2017-02-17 DIAGNOSIS — M8580 Other specified disorders of bone density and structure, unspecified site: Secondary | ICD-10-CM | POA: Diagnosis not present

## 2017-02-18 ENCOUNTER — Telehealth: Payer: Self-pay

## 2017-02-18 MED ORDER — DILTIAZEM HCL ER COATED BEADS 180 MG PO CP24: 180.0000 mg | ORAL_CAPSULE | Freq: Every day | ORAL | 1 refills | Status: DC

## 2017-02-18 NOTE — Telephone Encounter (Signed)
erx sent for diltiazem ER 180mg  caps per [facility] req.  LOV 02/08/2017

## 2017-02-22 ENCOUNTER — Other Ambulatory Visit (HOSPITAL_COMMUNITY)
Admission: RE | Admit: 2017-02-22 | Discharge: 2017-02-22 | Disposition: A | Discharge status: Home / Self Care | Payer: Medicare Other | Source: Other Acute Inpatient Hospital | Attending: Orthopaedic Surgery | Admitting: Orthopaedic Surgery

## 2017-02-22 DIAGNOSIS — T8142XA Infection following a procedure, deep incisional surgical site, initial encounter: Secondary | ICD-10-CM | POA: Insufficient documentation

## 2017-02-22 DIAGNOSIS — Y838 Other surgical procedures as the cause of abnormal reaction of the patient, or of later complication, without mention of misadventure at the time of the procedure: Secondary | ICD-10-CM | POA: Diagnosis not present

## 2017-02-22 LAB — HEPATIC FUNCTION PANEL
ALT: 16 U/L (ref 14–54)
AST: 27 U/L (ref 15–41)
Albumin: 3.6 g/dL (ref 3.5–5.0)
Alkaline Phosphatase: 86 U/L (ref 38–126)
Total Bilirubin: 0.3 mg/dL (ref 0.3–1.2)
Total Protein: 6.2 g/dL — ABNORMAL LOW (ref 6.5–8.1)

## 2017-02-24 DIAGNOSIS — H02203 Unspecified lagophthalmos right eye, unspecified eyelid: Secondary | ICD-10-CM | POA: Diagnosis not present

## 2017-02-24 DIAGNOSIS — H16223 Keratoconjunctivitis sicca, not specified as Sjogren's, bilateral: Secondary | ICD-10-CM | POA: Diagnosis not present

## 2017-02-24 DIAGNOSIS — H04123 Dry eye syndrome of bilateral lacrimal glands: Secondary | ICD-10-CM | POA: Diagnosis not present

## 2017-02-24 DIAGNOSIS — H01009 Unspecified blepharitis unspecified eye, unspecified eyelid: Secondary | ICD-10-CM | POA: Diagnosis not present

## 2017-03-01 ENCOUNTER — Other Ambulatory Visit (HOSPITAL_COMMUNITY)
Admission: RE | Admit: 2017-03-01 | Discharge: 2017-03-01 | Disposition: A | Discharge status: Home / Self Care | Payer: Medicare Other | Source: Other Acute Inpatient Hospital | Attending: Orthopaedic Surgery | Admitting: Orthopaedic Surgery

## 2017-03-01 DIAGNOSIS — T8140XA Infection following a procedure, unspecified, initial encounter: Secondary | ICD-10-CM | POA: Diagnosis not present

## 2017-03-01 DIAGNOSIS — Y839 Surgical procedure, unspecified as the cause of abnormal reaction of the patient, or of later complication, without mention of misadventure at the time of the procedure: Secondary | ICD-10-CM | POA: Diagnosis not present

## 2017-03-01 LAB — HEPATIC FUNCTION PANEL
ALT: 17 U/L (ref 14–54)
AST: 32 U/L (ref 15–41)
Albumin: 3.6 g/dL (ref 3.5–5.0)
Alkaline Phosphatase: 82 U/L (ref 38–126)
BILIRUBIN TOTAL: 0.6 mg/dL (ref 0.3–1.2)
Total Protein: 6.1 g/dL — ABNORMAL LOW (ref 6.5–8.1)

## 2017-03-04 ENCOUNTER — Telehealth: Payer: Self-pay | Admitting: Internal Medicine

## 2017-03-04 ENCOUNTER — Other Ambulatory Visit: Payer: Self-pay

## 2017-03-04 MED ORDER — ROSUVASTATIN CALCIUM 10 MG PO TABS: 10.0000 mg | ORAL_TABLET | Freq: Every day | ORAL | 5 refills | Status: DC

## 2017-03-04 NOTE — Telephone Encounter (Signed)
Pt called stating that she had surgery back in March and would like to begin physical therapy. She wanted to know to know where he would suggest she go, how often she should go and if she should do it in home or out patient.  She also requested a refill on her Trazadone.

## 2017-03-05 NOTE — Telephone Encounter (Signed)
Please advise 

## 2017-03-05 NOTE — Telephone Encounter (Signed)
OK to fill trazodone with additional refills x5 OP PT is better Can she be getting out of the house to go to PT? Thank you!

## 2017-03-08 ENCOUNTER — Other Ambulatory Visit (HOSPITAL_COMMUNITY)
Admit: 2017-03-08 | Discharge: 2017-03-08 | Disposition: A | Discharge status: Home / Self Care | Payer: Medicare Other | Source: Other Acute Inpatient Hospital | Attending: Orthopaedic Surgery | Admitting: Orthopaedic Surgery

## 2017-03-08 ENCOUNTER — Other Ambulatory Visit (HOSPITAL_COMMUNITY): Payer: Self-pay | Admitting: *Deleted

## 2017-03-08 DIAGNOSIS — M19072 Primary osteoarthritis, left ankle and foot: Secondary | ICD-10-CM | POA: Insufficient documentation

## 2017-03-08 LAB — HEPATIC FUNCTION PANEL
ALBUMIN: 3.7 g/dL (ref 3.5–5.0)
ALK PHOS: 90 U/L (ref 38–126)
ALT: 16 U/L (ref 14–54)
AST: 29 U/L (ref 15–41)
Bilirubin, Direct: <0.1 mg/dL — ABNORMAL LOW (ref 0.1–0.5)
TOTAL PROTEIN: 6.2 g/dL — AB (ref 6.5–8.1)
Total Bilirubin: 0.5 mg/dL (ref 0.3–1.2)

## 2017-03-11 MED ORDER — TRAZODONE HCL 100 MG PO TABS: 100.0000 mg | ORAL_TABLET | Freq: Every day | ORAL | 1 refills | Status: DC

## 2017-03-11 NOTE — Telephone Encounter (Signed)
RX sent, LMTCB

## 2017-03-12 ENCOUNTER — Ambulatory Visit (INDEPENDENT_AMBULATORY_CARE_PROVIDER_SITE_OTHER): Payer: Medicare Other | Admitting: Internal Medicine

## 2017-03-12 ENCOUNTER — Encounter: Payer: Self-pay | Admitting: Internal Medicine

## 2017-03-12 DIAGNOSIS — R413 Other amnesia: Secondary | ICD-10-CM | POA: Diagnosis not present

## 2017-03-12 DIAGNOSIS — F1120 Opioid dependence, uncomplicated: Secondary | ICD-10-CM | POA: Diagnosis not present

## 2017-03-12 DIAGNOSIS — F411 Generalized anxiety disorder: Secondary | ICD-10-CM

## 2017-03-12 DIAGNOSIS — L089 Local infection of the skin and subcutaneous tissue, unspecified: Secondary | ICD-10-CM

## 2017-03-12 MED ORDER — B COMPLEX PO TABS: 1.0000 | ORAL_TABLET | Freq: Every day | ORAL | 3 refills | Status: DC

## 2017-03-12 NOTE — Assessment & Plan Note (Addendum)
Worse  Multifactorial  Try to cut back on pain killers very slowly Neurology referral offered

## 2017-03-12 NOTE — Patient Instructions (Addendum)
[place], [place] [address]  Phone: [phone] Visit Location Page >>  Sunday 11:00 am-6:00 pm Monday 5:00 am-10:00 pm Tuesday 5:00 am-10:00 pm Wednesday 5:00 am-10:00 pm Thursday 5:00 am-10:00 pm Friday 5:00 am-9:00 pm Saturday 6:00 am-6:00 pm View Full Center Hours >> $35 per month for seniors  Try to cut back on pain killers very slowly

## 2017-03-12 NOTE — Progress Notes (Signed)
Subjective:  Patient ID: Bianca Haley, female    DOB: 1951/01/20  Age: 66 y.o. MRN: [id number]  CC: No chief complaint on file.   HPI Bianca Haley presents for a chronic L foot pain/wound re-do procedures. F/u osteomyelitis L foot C/o fatigue, stress w/sickness C/o forgetfulness, poor focus  Outpatient Medications Prior to Visit  Medication Sig Dispense Refill  . acetaminophen (TYLENOL) 325 MG tablet Take 1-2 tablets (325-650 mg total) by mouth every 6 (six) hours as needed for mild pain or moderate pain.    [name] albuterol (PROVENTIL HFA;VENTOLIN HFA) 108 (90 Base) MCG/ACT inhaler Inhale 2 puffs into the lungs every 4 (four) hours as needed for wheezing or shortness of breath. 18 g 2  . aspirin 325 MG tablet Take 325 mg by mouth 2 (two) times daily.    [name] atenolol (TENORMIN) 100 MG tablet TAKE 1 TABLET (100 MG) BY MOUTH EVERY DAY 90 tablet 1  . Cholecalciferol (VITAMIN D) 2000 units tablet Take 2,000 Units by mouth daily.    . diazepam (VALIUM) 5 MG tablet Take 5 mg by mouth every 12 (twelve) hours as needed for anxiety or muscle spasms.   2  . diltiazem (CARDIZEM CD) 180 MG 24 hr capsule Take 1 capsule (180 mg total) by mouth daily. 90 capsule 1  . gabapentin (NEURONTIN) 300 MG capsule Take 600 capsules by mouth 3 (three) times daily.     . micafungin (MYCAMINE) 100 MG SOLR injection Inject 100 mg into the vein See admin instructions. Administer 100 mg via picc line daily at lunch - start date 12/22/16, end date 03/16/17    . Multiple Vitamin (MULTIVITAMIN WITH MINERALS) TABS tablet Take 1 tablet by mouth daily.    [name] OVER THE COUNTER MEDICATION Place 1 application into both eyes at bedtime as needed (dry eyes). Over the counter lubricant eye gel    . oxyCODONE (OXY IR/ROXICODONE) 5 MG immediate release tablet Take 5 mg by mouth every 4 (four) hours as needed (pain).   0  . oxymorphone (OPANA) 10 MG tablet Take 5 mg by mouth 2 (two) times daily. Reported on 09/25/2015    . pantoprazole  (PROTONIX) 40 MG tablet Take 1 tablet (40 mg total) by mouth 2 (two) times daily before a meal. 180 tablet 3  . PARoxetine (PAXIL) 40 MG tablet Take 1 tablet (40 mg total) by mouth 2 (two) times daily. 180 tablet 3  . polyethylene glycol (MIRALAX / GLYCOLAX) packet Take 17 g by mouth 2 (two) times daily. (Patient taking differently: Take 17 g by mouth daily as needed (constipation). Mix in 8 oz liquid and drink) 14 each 0  . rosuvastatin (CRESTOR) 10 MG tablet Take 1 tablet (10 mg total) by mouth daily. 30 tablet 5  . SUMAtriptan (IMITREX) 100 MG tablet Take 100 mg by mouth See admin instructions. Take 1 tablet (100 mg) by mouth as needed for migraine, May repeat in 2 hours if headache persists or recurs.    . traZODone (DESYREL) 100 MG tablet Take 1 tablet (100 mg total) at bedtime by mouth. 90 tablet 1  . triamterene-hydrochlorothiazide (MAXZIDE-25) 37.5-25 MG tablet Take 1 tablet by mouth daily. 90 tablet 3   No facility-administered medications prior to visit.     ROS Review of Systems  Constitutional: Positive for fatigue. Negative for activity change, appetite change, chills and unexpected weight change.  HENT: Negative for congestion, mouth sores and sinus pressure.   Eyes: Negative for visual disturbance.  Respiratory:  Negative for cough and chest tightness.   Gastrointestinal: Negative for abdominal pain and nausea.  Genitourinary: Negative for difficulty urinating, frequency and vaginal pain.  Musculoskeletal: Positive for arthralgias and back pain. Negative for gait problem.  Skin: Negative for pallor and rash.  Neurological: Negative for dizziness, tremors, weakness, numbness and headaches.  Psychiatric/Behavioral: Positive for decreased concentration and dysphoric mood. Negative for confusion, sleep disturbance and suicidal ideas. The patient is nervous/anxious.     Objective:  BP (!) 142/80 (BP Location: Left Arm, Patient Position: Sitting, Cuff Size: Normal)   Pulse (!) 58    Temp 98.2 F (36.8 C) (Oral)   Ht 5' 5.5" (1.664 m)   Wt 146 lb (66.2 kg)   SpO2 98%   BMI 23.93 kg/m   BP Readings from Last 3 Encounters:  03/12/17 (!) 142/80  02/08/17 112/72  12/27/16 116/62    Wt Readings from Last 3 Encounters:  03/12/17 146 lb (66.2 kg)  02/08/17 139 lb (63 kg)  11/11/16 139 lb (63 kg)    Physical Exam  Constitutional: She appears well-developed. No distress.  HENT:  Head: Normocephalic.  Right Ear: External ear normal.  Left Ear: External ear normal.  Nose: Nose normal.  Mouth/Throat: Oropharynx is clear and moist.  Eyes: Conjunctivae are normal. Pupils are equal, round, and reactive to light. Right eye exhibits no discharge. Left eye exhibits no discharge.  Neck: Normal range of motion. Neck supple. No JVD present. No tracheal deviation present. No thyromegaly present.  Cardiovascular: Normal rate, regular rhythm and normal heart sounds.  Pulmonary/Chest: No stridor. No respiratory distress. She has no wheezes.  Abdominal: Soft. Bowel sounds are normal. She exhibits no distension and no mass. There is no tenderness. There is no rebound and no guarding.  Musculoskeletal: She exhibits no edema or tenderness.  Lymphadenopathy:    She has no cervical adenopathy.  Neurological: She displays normal reflexes. No cranial nerve deficit. She exhibits normal muscle tone. Coordination normal.  Skin: No rash noted. No erythema.  Psychiatric: She has a normal mood and affect. Her behavior is normal. Judgment and thought content normal.  November ? 1998 100-7=93 93-7=74  Lab Results  Component Value Date   WBC 4.3 01/25/2017   HGB 11.3 (L) 01/25/2017   HCT 33.9 (L) 01/25/2017   PLT 228 01/25/2017   GLUCOSE 100 (H) 09/15/2016   CHOL 262 (H) 09/26/2015   TRIG 71.0 09/26/2015   HDL 61.60 09/26/2015   LDLCALC 186 (H) 09/26/2015   ALT 16 03/08/2017   AST 29 03/08/2017   NA 136 09/15/2016   K 4.0 09/15/2016   CL 95 (L) 09/15/2016   CREATININE 1.31 (H)  09/15/2016   BUN 18 09/15/2016   CO2 33 (H) 09/15/2016   TSH 2.11 09/15/2016   INR 0.98 07/04/2015    No results found.  Assessment & Plan:   There are no diagnoses linked to this encounter. I am having Bianca Haley maintain her oxymorphone, gabapentin, diazepam, polyethylene glycol, acetaminophen, triamterene-hydrochlorothiazide, PARoxetine, pantoprazole, oxyCODONE, aspirin, Vitamin D, multivitamin with minerals, micafungin, SUMAtriptan, OVER THE COUNTER MEDICATION, atenolol, albuterol, diltiazem, rosuvastatin, and traZODone.  No orders of the defined types were placed in this encounter.    Follow-up: No Follow-up on file.  [name], MD

## 2017-03-14 ENCOUNTER — Encounter: Payer: Self-pay | Admitting: Internal Medicine

## 2017-03-14 NOTE — Assessment & Plan Note (Signed)
On Abx - mycamine

## 2017-03-14 NOTE — Assessment & Plan Note (Signed)
Discussed.

## 2017-03-14 NOTE — Assessment & Plan Note (Signed)
Paxil 

## 2017-03-15 DIAGNOSIS — M542 Cervicalgia: Secondary | ICD-10-CM | POA: Diagnosis not present

## 2017-03-15 DIAGNOSIS — I10 Essential (primary) hypertension: Secondary | ICD-10-CM | POA: Diagnosis not present

## 2017-03-15 DIAGNOSIS — M5416 Radiculopathy, lumbar region: Secondary | ICD-10-CM | POA: Diagnosis not present

## 2017-03-15 DIAGNOSIS — M5136 Other intervertebral disc degeneration, lumbar region: Secondary | ICD-10-CM | POA: Diagnosis not present

## 2017-03-31 NOTE — Telephone Encounter (Signed)
Pt seen 03/12/17

## 2017-04-01 ENCOUNTER — Telehealth: Payer: Self-pay | Admitting: Pulmonary Disease

## 2017-04-01 DIAGNOSIS — J189 Pneumonia, unspecified organism: Secondary | ICD-10-CM

## 2017-04-01 MED ORDER — BENZONATATE 200 MG PO CAPS: 200.0000 mg | ORAL_CAPSULE | Freq: Two times a day (BID) | ORAL | 0 refills | Status: DC | PRN

## 2017-04-01 MED ORDER — ALBUTEROL SULFATE HFA 108 (90 BASE) MCG/ACT IN AERS: 2.0000 | INHALATION_SPRAY | RESPIRATORY_TRACT | 2 refills | Status: DC | PRN

## 2017-04-01 MED ORDER — DOXYCYCLINE HYCLATE 100 MG PO TABS: 100.0000 mg | ORAL_TABLET | Freq: Two times a day (BID) | ORAL | 0 refills | Status: DC

## 2017-04-01 MED ORDER — DOXYCYCLINE HYCLATE 100 MG PO TABS: 100.0000 mg | ORAL_TABLET | Freq: Every day | ORAL | 0 refills | Status: DC

## 2017-04-01 NOTE — Addendum Note (Signed)
Addended by: Maryanna Shape A on: 04/01/2017 01:45 PM   Modules accepted: Orders

## 2017-04-01 NOTE — Telephone Encounter (Signed)
Benzonatate perles 200 mg twice daily as needed cough. Okay to take Delsym 5 mL twice daily over-the-counter

## 2017-04-01 NOTE — Telephone Encounter (Signed)
Spoke with pt, she states she has a cough and crackles in her upper respiratory. She has history of repeat pneumonia and she is having symptoms of having it again. RA sent in Doxycycline but needs a Rx for tessalon pearles. She is not getting any sleep with the cough. She states she had this given to her in the hospital and she states it helps her with the cough and doesn't make her sleepy. RA can we send in, I couldn't refill because it was not on her list.

## 2017-04-01 NOTE — Telephone Encounter (Signed)
Called and spoke with pt. Pt states taht she feels that she is heading towards PNA. Pt reports of prod cough with clear mucus, wheezing & body aches x2d.  Pt denies fever.  Pt taken Ventolin bid with mild improvement.  Pt requested refill for ventolin.  It does not appear that Dr. Elsworth Soho has prescribed albuterol HFA previously. Pt is requesting Rx for abx preferably not z-pak.   RA please advise on symptoms and refill. Thanks.

## 2017-04-01 NOTE — Telephone Encounter (Signed)
I have ATC to call pharmacy x3 and call was disconnected. Pt should be given doxy 100 daily not bid. Rx for doxy 100 bid was sent in error and has been d/c in epic.

## 2017-04-01 NOTE — Telephone Encounter (Signed)
Pt is aware of below message and voiced her understanding. Rx for Bianca Haley has been sent to preferred pharmacy. Nothing further needed.

## 2017-04-01 NOTE — Telephone Encounter (Signed)
Okay for albuterol inhaler 2 puffs every 6 hours as needed Doxycycline 100 mg daily for 7 days -call if worse for chest x-ray

## 2017-04-01 NOTE — Telephone Encounter (Signed)
Pt is aware of RA's recommendations and voiced her understanding. Rx has been sent to preferred pharmacy. nothing further needed.

## 2017-04-06 ENCOUNTER — Telehealth: Payer: Self-pay | Admitting: Pulmonary Disease

## 2017-04-06 DIAGNOSIS — R05 Cough: Secondary | ICD-10-CM

## 2017-04-06 DIAGNOSIS — R059 Cough, unspecified: Secondary | ICD-10-CM

## 2017-04-06 NOTE — Telephone Encounter (Signed)
Called and spoke with pt letting her know that RA is wanting her to get a cxr done so we can see what that shows. Told pt that based off the results of the cxr, if we needed to prescribe abx for pt, we would do that but not until after the cxr was done and we had the results.  Pt expressed understanding. Nothing further needed.

## 2017-04-06 NOTE — Telephone Encounter (Signed)
Obtain chest x-ray, if infiltrates present will need more antibiotics otherwise can avoid

## 2017-04-06 NOTE — Telephone Encounter (Signed)
Spoke with pt, she states she is not completely well. She is feeling better but she is still coughing and scared of getting pneumonia and wanted to see if she needs more ABX. She has had pneumonia 6 times and wants to make sure she doesn't get this again.   [facility]

## 2017-04-07 ENCOUNTER — Ambulatory Visit (INDEPENDENT_AMBULATORY_CARE_PROVIDER_SITE_OTHER)
Admission: RE | Admit: 2017-04-07 | Discharge: 2017-04-07 | Disposition: A | Discharge status: Home / Self Care | Payer: Medicare Other | Source: Ambulatory Visit | Attending: Pulmonary Disease | Admitting: Pulmonary Disease

## 2017-04-07 DIAGNOSIS — R05 Cough: Secondary | ICD-10-CM

## 2017-04-07 DIAGNOSIS — R059 Cough, unspecified: Secondary | ICD-10-CM

## 2017-04-21 ENCOUNTER — Telehealth: Payer: Self-pay | Admitting: Internal Medicine

## 2017-04-21 MED ORDER — SUMATRIPTAN SUCCINATE 100 MG PO TABS: 100.0000 mg | ORAL_TABLET | Freq: Every day | ORAL | 5 refills | Status: DC | PRN

## 2017-04-21 MED ORDER — SUMATRIPTAN SUCCINATE 100 MG PO TABS: 100.0000 mg | ORAL_TABLET | ORAL | 5 refills | Status: DC

## 2017-04-21 NOTE — Telephone Encounter (Signed)
Last BP 142/80 in office- patient was ill. Per protocol can not fill- sent to provider for review

## 2017-04-21 NOTE — Telephone Encounter (Signed)
Copied from [id number]. Topic: Quick Communication - See Telephone Encounter >> Apr 21, 2017  2:03 PM Aurelio Brash B wrote: CRM for notification. See Telephone encounter for:  Refill sumatriptan  [facility]  [place] 04/21/17.

## 2017-04-21 NOTE — Telephone Encounter (Signed)
rx renewed Thx

## 2017-04-22 ENCOUNTER — Ambulatory Visit: Payer: Medicare Other | Admitting: Internal Medicine

## 2017-04-22 MED ORDER — SUMATRIPTAN SUCCINATE 100 MG PO TABS: 100.0000 mg | ORAL_TABLET | Freq: Every day | ORAL | 5 refills | Status: DC | PRN

## 2017-04-22 NOTE — Addendum Note (Signed)
Addended by: Karren Cobble on: 04/22/2017 02:02 PM   Modules accepted: Orders

## 2017-04-22 NOTE — Telephone Encounter (Signed)
Notified pt rx has been sent to your local pharmacy.../lmb  

## 2017-05-07 ENCOUNTER — Telehealth: Payer: Self-pay | Admitting: Pulmonary Disease

## 2017-05-07 NOTE — Telephone Encounter (Signed)
Pt is aware of below recommendations and voiced her understanding. Nothing further is needed.  

## 2017-05-07 NOTE — Telephone Encounter (Signed)
Looks like we already did this in late November 2018  and then cxr was done in f/u which was nl but never saw her as a pt so does not warrant  another trial of abx s an ov at this point - - if getting worse over the weekend will need to go to [facility] and plan on seeing one of [place] in f/u to sort out what's really going on here.

## 2017-05-07 NOTE — Telephone Encounter (Signed)
Spoke with patient Pt reports she has SOB with exertion, wheezing, chest congestion, sweats, productive cough- yellow thick mucus Pt denies fever,chills. Pt advised that she has "crackles" when coughing, and has had this before that leads to pneumonia Advised patient routing a message to doctor in office today MW  MW please advise

## 2017-05-14 ENCOUNTER — Other Ambulatory Visit (INDEPENDENT_AMBULATORY_CARE_PROVIDER_SITE_OTHER): Payer: Medicare Other

## 2017-05-14 ENCOUNTER — Ambulatory Visit (INDEPENDENT_AMBULATORY_CARE_PROVIDER_SITE_OTHER): Payer: Medicare Other | Admitting: Internal Medicine

## 2017-05-14 ENCOUNTER — Encounter: Payer: Self-pay | Admitting: Internal Medicine

## 2017-05-14 DIAGNOSIS — R413 Other amnesia: Secondary | ICD-10-CM

## 2017-05-14 DIAGNOSIS — R600 Localized edema: Secondary | ICD-10-CM

## 2017-05-14 DIAGNOSIS — N183 Chronic kidney disease, stage 3 unspecified: Secondary | ICD-10-CM

## 2017-05-14 DIAGNOSIS — L089 Local infection of the skin and subcutaneous tissue, unspecified: Secondary | ICD-10-CM | POA: Diagnosis not present

## 2017-05-14 DIAGNOSIS — I10 Essential (primary) hypertension: Secondary | ICD-10-CM | POA: Diagnosis not present

## 2017-05-14 LAB — BASIC METABOLIC PANEL
BUN: 17 mg/dL (ref 6–23)
CHLORIDE: 95 meq/L — AB (ref 96–112)
CO2: 39 mEq/L — ABNORMAL HIGH (ref 19–32)
Calcium: 9.2 mg/dL (ref 8.4–10.5)
Creatinine, Ser: 1.06 mg/dL (ref 0.40–1.20)
GFR: 54.67 mL/min — ABNORMAL LOW (ref >60.00)
Glucose, Bld: 108 mg/dL — ABNORMAL HIGH (ref 70–99)
POTASSIUM: 3.1 meq/L — AB (ref 3.5–5.1)
Sodium: 139 mEq/L (ref 135–145)

## 2017-05-14 LAB — CBC WITH DIFFERENTIAL/PLATELET
BASOS ABS: 0 10*3/uL (ref 0.0–0.1)
Basophils Relative: 0.6 % (ref 0.0–3.0)
EOS ABS: 0.2 10*3/uL (ref 0.0–0.7)
Eosinophils Relative: 4.3 % (ref 0.0–5.0)
HCT: 37.2 % (ref 36.0–46.0)
Hemoglobin: 12.4 g/dL (ref 12.0–15.0)
LYMPHS ABS: 1.2 10*3/uL (ref 0.7–4.0)
LYMPHS PCT: 23.3 % (ref 12.0–46.0)
MCHC: 33.3 g/dL (ref 30.0–36.0)
MCV: 82.4 fl (ref 78.0–100.0)
Monocytes Absolute: 0.3 10*3/uL (ref 0.1–1.0)
Monocytes Relative: 6.1 % (ref 3.0–12.0)
NEUTROS ABS: 3.4 10*3/uL (ref 1.4–7.7)
NEUTROS PCT: 65.7 % (ref 43.0–77.0)
Platelets: 275 10*3/uL (ref 150.0–400.0)
RBC: 4.51 Mil/uL (ref 3.87–5.11)
RDW: 14.4 % (ref 11.5–15.5)
WBC: 5.1 10*3/uL (ref 4.0–10.5)

## 2017-05-14 MED ORDER — ZOSTER VAC RECOMB ADJUVANTED 50 MCG/0.5ML IM SUSR: 0.5000 mL | Freq: Once | INTRAMUSCULAR | 1 refills | Status: AC

## 2017-05-14 NOTE — Assessment & Plan Note (Signed)
Atenolol, Diltiazem Maxzide  

## 2017-05-14 NOTE — Assessment & Plan Note (Signed)
Better  

## 2017-05-14 NOTE — Progress Notes (Signed)
Subjective:  Patient ID: Bianca Haley, female    DOB: [birth year]  Age: 67 y.o. MRN: [id number]  CC: No chief complaint on file.   HPI Bianca Haley presents for HTN, depression, chronic pain f/u. Richardson Landry was dx'd w/melanoma - stress...  Outpatient Medications Prior to Visit  Medication Sig Dispense Refill  . acetaminophen (TYLENOL) 325 MG tablet Take 1-2 tablets (325-650 mg total) by mouth every 6 (six) hours as needed for mild pain or moderate pain.    [name] albuterol (PROVENTIL HFA;VENTOLIN HFA) 108 (90 Base) MCG/ACT inhaler Inhale 2 puffs into the lungs every 4 (four) hours as needed for wheezing or shortness of breath. 18 g 2  . aspirin 325 MG tablet Take 325 mg by mouth 2 (two) times daily.    [name] atenolol (TENORMIN) 100 MG tablet TAKE 1 TABLET (100 MG) BY MOUTH EVERY DAY 90 tablet 1  . b complex vitamins tablet Take 1 tablet daily by mouth. 100 tablet 3  . benzonatate (TESSALON) 200 MG capsule Take 1 capsule (200 mg total) by mouth 2 (two) times daily as needed for cough. 30 capsule 0  . Cholecalciferol (VITAMIN D) 2000 units tablet Take 2,000 Units by mouth daily.    . diazepam (VALIUM) 5 MG tablet Take 5 mg by mouth every 12 (twelve) hours as needed for anxiety or muscle spasms.   2  . diltiazem (CARDIZEM CD) 180 MG 24 hr capsule Take 1 capsule (180 mg total) by mouth daily. 90 capsule 1  . gabapentin (NEURONTIN) 300 MG capsule Take 600 capsules by mouth 3 (three) times daily.     . Multiple Vitamin (MULTIVITAMIN WITH MINERALS) TABS tablet Take 1 tablet by mouth daily.    [name] OVER THE COUNTER MEDICATION Place 1 application into both eyes at bedtime as needed (dry eyes). Over the counter lubricant eye gel    . oxyCODONE (OXY IR/ROXICODONE) 5 MG immediate release tablet Take 5 mg by mouth every 4 (four) hours as needed (pain).   0  . oxymorphone (OPANA) 10 MG tablet Take 5 mg by mouth 2 (two) times daily. Reported on 09/25/2015    . pantoprazole (PROTONIX) 40 MG tablet Take 1 tablet (40 mg  total) by mouth 2 (two) times daily before a meal. 180 tablet 3  . PARoxetine (PAXIL) 40 MG tablet Take 1 tablet (40 mg total) by mouth 2 (two) times daily. 180 tablet 3  . polyethylene glycol (MIRALAX / GLYCOLAX) packet Take 17 g by mouth 2 (two) times daily. (Patient taking differently: Take 17 g by mouth daily as needed (constipation). Mix in 8 oz liquid and drink) 14 each 0  . rosuvastatin (CRESTOR) 10 MG tablet Take 1 tablet (10 mg total) by mouth daily. 30 tablet 5  . SUMAtriptan (IMITREX) 100 MG tablet Take 1 tablet (100 mg total) by mouth daily as needed for migraine. Take 1 tablet (100 mg) by mouth as needed for migraine. May repeat in 2 hours if headache persists or recurs. 12 tablet 5  . traZODone (DESYREL) 100 MG tablet Take 1 tablet (100 mg total) at bedtime by mouth. 90 tablet 1  . triamterene-hydrochlorothiazide (MAXZIDE-25) 37.5-25 MG tablet Take 1 tablet by mouth daily. 90 tablet 3  . doxycycline (VIBRA-TABS) 100 MG tablet Take 1 tablet (100 mg total) by mouth daily. 7 tablet 0  . micafungin (MYCAMINE) 100 MG SOLR injection Inject 100 mg into the vein See admin instructions. Administer 100 mg via picc line daily at lunch - start  date 12/22/16, end date 03/16/17     No facility-administered medications prior to visit.     ROS Review of Systems  Constitutional: Positive for fatigue. Negative for activity change, appetite change, chills and unexpected weight change.  HENT: Negative for congestion, mouth sores and sinus pressure.   Eyes: Negative for visual disturbance.  Respiratory: Negative for cough and chest tightness.   Gastrointestinal: Negative for abdominal pain and nausea.  Genitourinary: Negative for difficulty urinating, frequency and vaginal pain.  Musculoskeletal: Positive for back pain and gait problem.  Skin: Negative for pallor and rash.  Neurological: Negative for dizziness, tremors, weakness, numbness and headaches.  Psychiatric/Behavioral: Negative for confusion  and sleep disturbance. The patient is nervous/anxious.     Objective:  BP 132/74 (BP Location: Left Arm, Patient Position: Sitting, Cuff Size: Large)   Pulse 62   Temp 98.4 F (36.9 C) (Oral)   Ht 5' 5.5" (1.664 m)   Wt 145 lb (65.8 kg)   SpO2 98%   BMI 23.76 kg/m   BP Readings from Last 3 Encounters:  05/14/17 132/74  03/12/17 (!) 142/80  02/08/17 112/72    Wt Readings from Last 3 Encounters:  05/14/17 145 lb (65.8 kg)  03/12/17 146 lb (66.2 kg)  02/08/17 139 lb (63 kg)    Physical Exam  Constitutional: She appears well-developed. No distress.  HENT:  Head: Normocephalic.  Right Ear: External ear normal.  Left Ear: External ear normal.  Nose: Nose normal.  Mouth/Throat: Oropharynx is clear and moist.  Eyes: Conjunctivae are normal. Pupils are equal, round, and reactive to light. Right eye exhibits no discharge. Left eye exhibits no discharge.  Neck: Normal range of motion. Neck supple. No JVD present. No tracheal deviation present. No thyromegaly present.  Cardiovascular: Normal rate, regular rhythm and normal heart sounds.  Pulmonary/Chest: No stridor. No respiratory distress. She has no wheezes.  Abdominal: Soft. Bowel sounds are normal. She exhibits no distension and no mass. There is no tenderness. There is no rebound and no guarding.  Musculoskeletal: She exhibits no edema or tenderness.  Lymphadenopathy:    She has no cervical adenopathy.  Neurological: She displays normal reflexes. No cranial nerve deficit. She exhibits normal muscle tone. Coordination normal.  Skin: No rash noted. No erythema.  Psychiatric: She has a normal mood and affect. Her behavior is normal. Judgment and thought content normal.    Lab Results  Component Value Date   WBC 4.3 01/25/2017   HGB 11.3 (L) 01/25/2017   HCT 33.9 (L) 01/25/2017   PLT 228 01/25/2017   GLUCOSE 100 (H) 09/15/2016   CHOL 262 (H) 09/26/2015   TRIG 71.0 09/26/2015   HDL 61.60 09/26/2015   LDLCALC 186 (H)  09/26/2015   ALT 16 03/08/2017   AST 29 03/08/2017   NA 136 09/15/2016   K 4.0 09/15/2016   CL 95 (L) 09/15/2016   CREATININE 1.31 (H) 09/15/2016   BUN 18 09/15/2016   CO2 33 (H) 09/15/2016   TSH 2.11 09/15/2016   INR 0.98 07/04/2015    Dg Chest 2 View  Result Date: 04/07/2017 CLINICAL DATA:  Productive cough, wheezing for 6 days EXAM: CHEST  2 VIEW COMPARISON:  11/06/2016 FINDINGS: There is bilateral chronic interstitial thickening. There is no focal parenchymal opacity. There is no pleural effusion or pneumothorax. The heart and mediastinal contours are unremarkable. The osseous structures are unremarkable. IMPRESSION: No active cardiopulmonary disease. Electronically Signed   By: Kathreen Devoid   On: 04/07/2017 12:01  Assessment & Plan:   There are no diagnoses linked to this encounter. I have discontinued Paislie L. Cassar's micafungin and doxycycline. I am also having her maintain her oxymorphone, gabapentin, diazepam, polyethylene glycol, acetaminophen, triamterene-hydrochlorothiazide, PARoxetine, pantoprazole, oxyCODONE, aspirin, Vitamin D, multivitamin with minerals, OVER THE COUNTER MEDICATION, atenolol, diltiazem, rosuvastatin, traZODone, b complex vitamins, albuterol, benzonatate, and SUMAtriptan.  No orders of the defined types were placed in this encounter.    Follow-up: No Follow-up on file.  [name], MD

## 2017-05-14 NOTE — Assessment & Plan Note (Signed)
Wound has healed

## 2017-05-14 NOTE — Assessment & Plan Note (Signed)
labs

## 2017-05-15 LAB — IRON,TIBC AND FERRITIN PANEL
%SAT: 11 % (calc) (ref 11–50)
Ferritin: 11 ng/mL — ABNORMAL LOW (ref 20–288)
IRON: 50 ug/dL (ref 45–160)
TIBC: 465 mcg/dL (calc) — ABNORMAL HIGH (ref 250–450)

## 2017-05-16 ENCOUNTER — Other Ambulatory Visit: Payer: Self-pay | Admitting: Internal Medicine

## 2017-05-16 MED ORDER — FERROUS SULFATE 325 (65 FE) MG PO TABS: 325.0000 mg | ORAL_TABLET | Freq: Every day | ORAL | 6 refills | Status: DC

## 2017-05-16 MED ORDER — POTASSIUM CHLORIDE ER 10 MEQ PO TBCR: 10.0000 meq | EXTENDED_RELEASE_TABLET | Freq: Every day | ORAL | 0 refills | Status: DC

## 2017-05-31 DIAGNOSIS — M76822 Posterior tibial tendinitis, left leg: Secondary | ICD-10-CM | POA: Diagnosis not present

## 2017-05-31 DIAGNOSIS — M2012 Hallux valgus (acquired), left foot: Secondary | ICD-10-CM | POA: Diagnosis not present

## 2017-05-31 DIAGNOSIS — M19172 Post-traumatic osteoarthritis, left ankle and foot: Secondary | ICD-10-CM | POA: Diagnosis not present

## 2017-06-03 ENCOUNTER — Other Ambulatory Visit: Payer: Self-pay

## 2017-06-03 MED ORDER — DILTIAZEM HCL ER COATED BEADS 180 MG PO CP24: 180.0000 mg | ORAL_CAPSULE | Freq: Every day | ORAL | 1 refills | Status: DC

## 2017-06-08 ENCOUNTER — Telehealth: Payer: Self-pay | Admitting: Pulmonary Disease

## 2017-06-08 MED ORDER — DOXYCYCLINE HYCLATE 100 MG PO TABS: 100.0000 mg | ORAL_TABLET | Freq: Two times a day (BID) | ORAL | 0 refills | Status: DC

## 2017-06-08 NOTE — Telephone Encounter (Signed)
Spoke with patient. She is aware of RA's recs. Nothing else needed at time of call.  

## 2017-06-08 NOTE — Telephone Encounter (Signed)
Spoke with patient. She stated that she has crackles in her lungs. Increased cough that is worse at night, Tessalon perles are not helping. She has used her albuterol inhaler, no relief. Productive cough with yellow phlegm. Denies any fever or body aches.   She states that zpak does not work for her.   She wishes to use [facility] on [address].   RA, please advise. Thanks!

## 2017-06-08 NOTE — Telephone Encounter (Signed)
Doxy 100 bid x 7days Call for CXR if no better

## 2017-06-09 DIAGNOSIS — H53143 Visual discomfort, bilateral: Secondary | ICD-10-CM | POA: Diagnosis not present

## 2017-06-09 DIAGNOSIS — H02203 Unspecified lagophthalmos right eye, unspecified eyelid: Secondary | ICD-10-CM | POA: Diagnosis not present

## 2017-06-09 DIAGNOSIS — H04123 Dry eye syndrome of bilateral lacrimal glands: Secondary | ICD-10-CM | POA: Diagnosis not present

## 2017-06-09 DIAGNOSIS — H16223 Keratoconjunctivitis sicca, not specified as Sjogren's, bilateral: Secondary | ICD-10-CM | POA: Diagnosis not present

## 2017-06-11 ENCOUNTER — Ambulatory Visit (INDEPENDENT_AMBULATORY_CARE_PROVIDER_SITE_OTHER): Payer: Medicare Other | Admitting: Internal Medicine

## 2017-06-11 ENCOUNTER — Telehealth: Payer: Self-pay | Admitting: Pulmonary Disease

## 2017-06-11 ENCOUNTER — Encounter: Payer: Self-pay | Admitting: Internal Medicine

## 2017-06-11 DIAGNOSIS — R058 Other specified cough: Secondary | ICD-10-CM

## 2017-06-11 DIAGNOSIS — R05 Cough: Secondary | ICD-10-CM

## 2017-06-11 MED ORDER — BENZONATATE 200 MG PO CAPS: ORAL_CAPSULE | ORAL | 2 refills | Status: DC

## 2017-06-11 NOTE — Telephone Encounter (Signed)
Spoke with patient. She is still having some SOB and crackles in her chest. She is now feeling weak. She called on 2/5 and RA advised her to take Doxy 100mg  twice daily and to call back if she wasn't feeling better. She wants to come in for xray.   MW, since RA is not here today, is ok to order the CXR under your name? Please advise. Thanks!

## 2017-06-11 NOTE — Telephone Encounter (Signed)
Spoke with patient. She has been scheduled with MW at 4pm. Nothing else needed at time of call.

## 2017-06-11 NOTE — Telephone Encounter (Signed)
Has not been seen in a year so needs ov with cxr -not just a cxr- if no one has availability I can see her at 4 pm as we have a held spot for this but would need to bring all meds with her

## 2017-06-11 NOTE — Patient Instructions (Signed)
Cough > mucinex dm 1200 mg every 12 hours and supplement with tessalon 200 mg every 8 hours as needed    GERD (REFLUX)  is an extremely common cause of respiratory symptoms just like yours , many times with no obvious heartburn at all.    It can be treated with medication, but also with lifestyle changes including elevation of the head of your bed (ideally with 6 inch  bed blocks),  Smoking cessation, avoidance of late meals, excessive alcohol, and avoid fatty foods, chocolate, peppermint, colas, red wine, and acidic juices such as orange juice.  NO MINT OR MENTHOL PRODUCTS SO NO COUGH DROPS  USE SUGARLESS CANDY INSTEAD (Jolley ranchers or Stover's or Life Savers) or even ice chips will also do - the key is to swallow to prevent all throat clearing. NO OIL BASED VITAMINS - use powdered substitutes.

## 2017-06-11 NOTE — Progress Notes (Signed)
Subjective:    Patient ID: Bianca Haley, female    DOB: [birth year],    MRN: [id number]    Brief patient profile:  60 yowf never smoker with recurrent pneumonia attributed to aspiration ? 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 IgG,IgaA and IgM levels were low, but not immunosuppressed.   09/2013 admitted for recurrent PNA /aspiration pneumonitis . CXR showed R>L consolidation c/w multifocal PNA.  MBS this admit without dysphagia but raised concerns of possible primary esophageal dysmotility as barium pill stopped mid esophagus requiring additional thin barium to facilitate transit to stomach , PPI to BID prior to discharge.     04/2014 EGD showed mild esophagitis.     06/11/2017  f/u ov/Tocara Mennen re: ? Recurrent pna  Chief Complaint  Patient presents with  . Acute Visit    cough that won't go away, increased wheezing when laying down, chest congestion and heaviness  was doing well until acutely worse x one week  prior to OV  On no resp meds/ ? Caught a cold from brother (sev days after exp)  Sneezing cough congestion then noisy breathing and chest heavy lying down  Dyspnea:  Only on steps  Cough: some esp lying down Sleep: almost sitting up  Vs nl 45 degrees baseline due to immediate "wheeze and crackling" supine "every time"  saba not helping   No obvious day to day or daytime variability or assoc excess/ purulent sputum or mucus plugs or hemoptysis or cp or chest tightness,   or overt sinus or hb symptoms. No unusual exposure hx or h/o childhood pna/ asthma or knowledge of premature birth.    Also denies any obvious fluctuation of symptoms with weather or  environmental changes or other aggravating or alleviating factors except as outlined above   Current Allergies, Complete Past Medical History, Past Surgical History, Family History, and Social History were reviewed in [employer] record.  ROS  The following are not active complaints unless bolded Hoarseness, sore throat, dysphagia, dental problems, itching, sneezing,  nasal congestion or discharge of excess mucus or purulent secretions, ear ache,   fever, chills, sweats, unintended wt loss or wt gain, classically pleuritic or exertional cp,  orthopnea pnd or leg swelling, presyncope, palpitations, abdominal pain, anorexia, nausea, vomiting, diarrhea  or change in bowel habits or change in bladder habits, change in stools or change in urine, dysuria, hematuria,  rash, arthralgias, visual complaints, headache, numbness, weakness or ataxia or problems with walking or coordination,  change in mood/affect or memory.        Med list: does not correlate with what she takes/ unable to verify correct list (brought her own which she admitted was not updated or accurate        Objective:   Physical Exam    amb wf nad peculiar affect but quite pleasant    Wt Readings from Last 3 Encounters:  06/11/17 144 lb 12.8 oz (65.7 kg)  05/14/17 145 lb (65.8 kg)  03/12/17 146 lb (66.2 kg)     Vital signs reviewed - Note on arrival 02 sats  95% on RA     HEENT: nl dentition, turbinates bilaterally, and oropharynx. Nl external  ear canals without cough reflex   NECK :  without JVD/Nodes/TM/ nl carotid upstrokes bilaterally   LUNGS: no acc muscle use,  Nl contour chest which is clear to A and P bilaterally without cough on insp or exp maneuvers - also completely clear supine - nothing at all reproducible in this position    CV:  RRR  no s3 or murmur or increase in P2, and no edema   ABD:  soft and nontender with nl inspiratory excursion in the supine position. No bruits or  organomegaly appreciated, bowel sounds nl  MS:  Nl gait/ ext warm without deformities, calf tenderness, cyanosis or clubbing No obvious joint restrictions   SKIN: warm and dry without lesions    NEURO:  alert, approp, nl sensorium with  no motor or cerebellar deficits apparent.      Assessment & Plan:

## 2017-06-12 ENCOUNTER — Encounter: Payer: Self-pay | Admitting: Internal Medicine

## 2017-06-12 DIAGNOSIS — R058 Other specified cough: Secondary | ICD-10-CM | POA: Insufficient documentation

## 2017-06-12 DIAGNOSIS — R05 Cough: Secondary | ICD-10-CM | POA: Insufficient documentation

## 2017-06-12 NOTE — Assessment & Plan Note (Addendum)
No evidence at all to support dx of active asthma or pneumonia though likely does have viral uri and predominantly  UACS  = Upper airway cough syndrome (previously labeled PNDS),  is so named because it's frequently impossible to sort out how much is  CR/sinusitis with freq throat clearing (which can be related to primary GERD)   vs  causing  secondary (" extra esophageal")  GERD from wide swings in gastric pressure that occur with throat clearing, often  promoting self use of mint and menthol lozenges that reduce the lower esophageal sphincter tone and exacerbate the problem further in a cyclical fashion.   These are the same pts (now being labeled as having "irritable larynx syndrome" by some cough centers) who not infrequently have a history of having failed to tolerate ace inhibitors,  dry powder inhalers or biphosphonates or report having atypical/extraesophageal reflux symptoms that don't respond to standard doses of PPI  and are easily confused as having aecopd or asthma flares by even experienced allergists/ pulmonologists (myself included).  Of the three most common causes of  Sub-acute or recurrent or chronic cough, only one (GERD)  can actually contribute to/ trigger  the other two (asthma and post nasal drip syndrome)  and perpetuate the cylce of cough.  While not intuitively obvious, many patients with chronic low grade reflux do not cough until there is a primary insult that disturbs the protective epithelial barrier and exposes sensitive nerve endings.   This is typically viral as likely the case here but can be direct physical injury such as with an endotracheal tube.     The point is that once this occurs, it is difficult to eliminate the cycle  using anything but a maximally effective acid suppression regimen at least in the short run, accompanied by an appropriate diet to address non acid GERD and control / eliminate the cough itself for at least 3 days with mucinex dm and prn tessalon as  she is already on opana.   I had an extended discussion with the patient reviewing all relevant studies completed to date and  Over half of this   Acute  visit  With pt new to me was spent in counseling   Each maintenance medication was reviewed in detail including most importantly the difference between maintenance and prns and under what circumstances the prns are to be triggered using an action plan format that is not reflected in the computer generated alphabetically organized AVS.    Please see AVS for specific instructions unique to this visit that I personally wrote and verbalized to the the pt in detail and then reviewed with pt  by my nurse highlighting any  changes in therapy recommended at today's visit to their plan of care.    Advised on every ov should return with all meds in hand using a trust but verify approach to confirm accurate Medication  Reconciliation The principal here is that until we are certain that the  patients are doing what we've asked, it makes no sense to ask them to do more.

## 2017-06-16 ENCOUNTER — Other Ambulatory Visit: Payer: Self-pay | Admitting: *Deleted

## 2017-06-16 MED ORDER — ATENOLOL 100 MG PO TABS: ORAL_TABLET | ORAL | 1 refills | Status: DC

## 2017-06-21 ENCOUNTER — Telehealth: Payer: Self-pay | Admitting: Pulmonary Disease

## 2017-06-21 MED ORDER — DOXYCYCLINE HYCLATE 100 MG PO TABS: 100.0000 mg | ORAL_TABLET | Freq: Two times a day (BID) | ORAL | 0 refills | Status: DC

## 2017-06-21 NOTE — Telephone Encounter (Signed)
Offer doxycycline 100 mg, # 14, 1 twice daily  Stay well hydrated  If get worse may need to go to ER

## 2017-06-21 NOTE — Telephone Encounter (Signed)
Spoke with Bianca Haley's husband, he states she is getting worse than she was last week. She states she is having the "crackles" which usually leads to pneumonia. She has tried [employer] for her cough but minimal relief. She does have body aches and a possible fever but wasn't not checked with a thermometer. Her throat is hurting and she is very lethargic Please advise   [facility]  Current Outpatient Medications on File Prior to Visit  Medication Sig Dispense Refill  . acetaminophen (TYLENOL) 325 MG tablet Take 1-2 tablets (325-650 mg total) by mouth every 6 (six) hours as needed for mild pain or moderate pain.    [name] albuterol (PROVENTIL HFA;VENTOLIN HFA) 108 (90 Base) MCG/ACT inhaler Inhale 2 puffs into the lungs every 4 (four) hours as needed for wheezing or shortness of breath. 18 g 2  . aspirin 325 MG tablet Take 325 mg by mouth 2 (two) times daily.    [name] atenolol (TENORMIN) 100 MG tablet TAKE 1 TABLET (100 MG) BY MOUTH EVERY DAY 90 tablet 1  . b complex vitamins tablet Take 1 tablet daily by mouth. 100 tablet 3  . benzonatate (TESSALON) 200 MG capsule One every  8 hours as needed 45 capsule 2  . Cholecalciferol (VITAMIN D) 2000 units tablet Take 2,000 Units by mouth daily.    . diazepam (VALIUM) 5 MG tablet Take 5 mg by mouth every 12 (twelve) hours as needed for anxiety or muscle spasms.   2  . diltiazem (CARDIZEM CD) 180 MG 24 hr capsule Take 1 capsule (180 mg total) by mouth daily. 90 capsule 1  . doxycycline (VIBRA-TABS) 100 MG tablet Take 1 tablet (100 mg total) by mouth 2 (two) times daily. 14 tablet 0  . gabapentin (NEURONTIN) 300 MG capsule Take 600 capsules by mouth 3 (three) times daily.     . Multiple Vitamin (MULTIVITAMIN WITH MINERALS) TABS tablet Take 1 tablet by mouth daily.    [name] OVER THE COUNTER MEDICATION Place 1 application into both eyes at bedtime as needed (dry eyes). Over the counter lubricant eye gel    . oxymorphone (OPANA) 10 MG tablet Take 5 mg by mouth 2 (two)  times daily. Reported on 09/25/2015    . pantoprazole (PROTONIX) 40 MG tablet Take 1 tablet (40 mg total) by mouth 2 (two) times daily before a meal. 180 tablet 3  . PARoxetine (PAXIL) 40 MG tablet Take 1 tablet (40 mg total) by mouth 2 (two) times daily. 180 tablet 3  . polyethylene glycol (MIRALAX / GLYCOLAX) packet Take 17 g by mouth 2 (two) times daily. (Patient taking differently: Take 17 g by mouth daily as needed (constipation). Mix in 8 oz liquid and drink) 14 each 0  . potassium chloride (KLOR-CON 10) 10 MEQ tablet Take 1 tablet (10 mEq total) by mouth daily. 30 tablet 0  . rosuvastatin (CRESTOR) 10 MG tablet Take 1 tablet (10 mg total) by mouth daily. 30 tablet 5  . SUMAtriptan (IMITREX) 100 MG tablet Take 1 tablet (100 mg total) by mouth daily as needed for migraine. Take 1 tablet (100 mg) by mouth as needed for migraine. May repeat in 2 hours if headache persists or recurs. 12 tablet 5  . traZODone (DESYREL) 100 MG tablet Take 1 tablet (100 mg total) at bedtime by mouth. 90 tablet 1   No current facility-administered medications on file prior to visit.    Allergies  Allergen Reactions  . Penicillins Shortness Of Breath and Rash    .[name]  Has patient had a PCN reaction causing immediate rash, facial/tongue/throat swelling, SOB or lightheadedness with hypotension: No Has patient had a PCN reaction causing severe rash involving mucus membranes or skin necrosis: No Has patient had a PCN reaction that required hospitalization No Has patient had a PCN reaction occurring within the last 10 years: No If all of the above answers are "NO", then may proceed with Cephalosporin use.   [name] Morphine And Related Swelling    Sedation/Swelling described like edema/bloating/doesn't help the pain Morphine only; tolerates other opioids.   . Cefepime Rash    Reaction while taking both cefepime and vancomycin at [facility] (after 3 days)  . Vancomycin Rash    Reaction while taking both cefepime and vancomycin at  [facility] (after 3 days)

## 2017-06-21 NOTE — Telephone Encounter (Signed)
Called pt and advised message from the provider. Pt understood and verbalized understanding. Nothing further is needed.    Rx sent in. 

## 2017-06-23 ENCOUNTER — Ambulatory Visit (INDEPENDENT_AMBULATORY_CARE_PROVIDER_SITE_OTHER): Payer: Medicare Other | Admitting: Pulmonary Disease

## 2017-06-23 ENCOUNTER — Encounter: Payer: Self-pay | Admitting: Pulmonary Disease

## 2017-06-23 DIAGNOSIS — J189 Pneumonia, unspecified organism: Secondary | ICD-10-CM

## 2017-06-23 DIAGNOSIS — M5412 Radiculopathy, cervical region: Secondary | ICD-10-CM | POA: Diagnosis not present

## 2017-06-23 DIAGNOSIS — I10 Essential (primary) hypertension: Secondary | ICD-10-CM | POA: Diagnosis not present

## 2017-06-23 DIAGNOSIS — M542 Cervicalgia: Secondary | ICD-10-CM | POA: Diagnosis not present

## 2017-06-23 DIAGNOSIS — M5136 Other intervertebral disc degeneration, lumbar region: Secondary | ICD-10-CM | POA: Diagnosis not present

## 2017-06-23 DIAGNOSIS — M5416 Radiculopathy, lumbar region: Secondary | ICD-10-CM | POA: Diagnosis not present

## 2017-06-23 MED ORDER — HYDROCODONE-HOMATROPINE 5-1.5 MG/5ML PO SYRP: 5.0000 mL | ORAL_SOLUTION | Freq: Two times a day (BID) | ORAL | 0 refills | Status: DC | PRN

## 2017-06-23 MED ORDER — ALBUTEROL SULFATE HFA 108 (90 BASE) MCG/ACT IN AERS: 2.0000 | INHALATION_SPRAY | RESPIRATORY_TRACT | 2 refills | Status: DC | PRN

## 2017-06-23 NOTE — Progress Notes (Signed)
   Subjective:    Patient ID: Bianca Haley, female    DOB: [birth year], 67 y.o.   MRN: [id number]  HPI  67 yo never smoker with recurrent pneumonia attributed to aspiration ? due to narcotics & esophageal dysmotility -first seen for chronic cough x 58mnths on 02/21/13  Works parttime @ [employer]  She called in February complaining of fever, cough productive of green sputum and wheezing and doxycycline was called and, she had an office visit 2/8 where she seemed to be improving.  But symptoms return and more doxycycline was called in 2 days ago. She states that cough is still present however sputum has turned white, no fevers, does report occasional wheezing and need a refill on her albuterol. She also requests cough medication.  She takes opana as needed for chronic back pain, also another sedating medication such as Neurontin, Valium as needed and trazodone - Over the years she has been able to cut down on the dose of narcotics with significant decrease in her episodes of recurrent pneumonia    Significant tests/ events  02/2013 CT chest showed RML/RLL pneumonia and what appeared to be some progression of bronchiectasis (first noted 2011)   Esophagram was negative. IgG,IgaA and IgM levels were low, but not immunosuppressed.   09/2013 admitted for recurrent PNA /aspiration pneumonitis . CXR showed R>L consolidation c/w multifocal PNA.  MBS >> dysphagia but raised concerns of possible primary esophageal dysmotility as barium pill stopped mid esophagus requiring additional thin barium to facilitate transit to stomach , PPI to BID prior to discharge.    04/2014 EGD showed mild esophagitis.  2015 BL severe pneumonia when visiting son in [place]   08/2014 admitted for pneumonia .   Review of Systems neg for any significant sore throat, dysphagia, itching, sneezing, nasal congestion or excess/ purulent secretions, fever, chills, sweats, unintended wt loss, pleuritic or  exertional cp, hempoptysis, orthopnea pnd or change in chronic leg swelling. Also denies presyncope, palpitations, heartburn, abdominal pain, nausea, vomiting, diarrhea or change in bowel or urinary habits, dysuria,hematuria, rash, arthralgias, visual complaints, headache, numbness weakness or ataxia.     Objective:   Physical Exam  Gen. Pleasant, well-nourished, in no distress ENT - no thrush, no post nasal drip Neck: No JVD, no thyromegaly, no carotid bruits Lungs: no use of accessory muscles, no dullness to percussion, LLL rales, no rhonchi  Cardiovascular: Rhythm regular, heart sounds  normal, no murmurs or gallops, no peripheral edema Musculoskeletal: No deformities, no cyanosis or clubbing        Assessment & Plan:

## 2017-06-23 NOTE — Patient Instructions (Signed)
Refill on albuterol. Hycodan cough syrup 5 mL twice daily as needed for cough #120 ml Be careful with other sedating medications that you are on

## 2017-06-23 NOTE — Assessment & Plan Note (Signed)
Complete course of doxy  Refill on albuterol. Hycodan cough syrup 5 mL twice daily as needed for cough #120 ml Be careful with other sedating medications that you are on

## 2017-06-23 NOTE — Assessment & Plan Note (Signed)
Ct protonix  Discussed other measures

## 2017-06-28 ENCOUNTER — Telehealth: Payer: Self-pay | Admitting: Pulmonary Disease

## 2017-06-28 DIAGNOSIS — R059 Cough, unspecified: Secondary | ICD-10-CM

## 2017-06-28 DIAGNOSIS — R05 Cough: Secondary | ICD-10-CM

## 2017-06-28 MED ORDER — LEVOFLOXACIN 500 MG PO TABS: 500.0000 mg | ORAL_TABLET | Freq: Every day | ORAL | 0 refills | Status: DC

## 2017-06-28 NOTE — Telephone Encounter (Signed)
Levaquin 500 mg daily x 7 days Have her obtain CXR No more Hycodan

## 2017-06-28 NOTE — Telephone Encounter (Signed)
Called pt who stated she has one pill of doxy left that she will take tonight but her symptoms are not fully gone.  Pt states she believes she needs a stronger abx to help with her symptoms of cough and wheezing that she has had x2 weeks now.  Pt is wanting another Rx of the hycodan which was last refilled 06/23/17.  Dr. Elsworth Soho, please advise on recommendations for pt. Thanks!

## 2017-06-28 NOTE — Telephone Encounter (Signed)
Spoke with patient. She is aware of RA's recs. Will go ahead and call in medication and order CXR. Nothing else needed at time of call.

## 2017-06-29 ENCOUNTER — Ambulatory Visit (INDEPENDENT_AMBULATORY_CARE_PROVIDER_SITE_OTHER)
Admission: RE | Admit: 2017-06-29 | Discharge: 2017-06-29 | Disposition: A | Discharge status: Home / Self Care | Payer: Medicare Other | Source: Ambulatory Visit | Attending: Pulmonary Disease | Admitting: Pulmonary Disease

## 2017-06-29 DIAGNOSIS — R05 Cough: Secondary | ICD-10-CM

## 2017-06-29 DIAGNOSIS — R059 Cough, unspecified: Secondary | ICD-10-CM

## 2017-07-01 ENCOUNTER — Other Ambulatory Visit: Payer: Self-pay

## 2017-07-01 MED ORDER — POTASSIUM CHLORIDE ER 10 MEQ PO TBCR: 10.0000 meq | EXTENDED_RELEASE_TABLET | Freq: Every day | ORAL | 5 refills | Status: DC

## 2017-07-06 ENCOUNTER — Telehealth: Payer: Self-pay | Admitting: Pulmonary Disease

## 2017-07-06 MED ORDER — PREDNISONE 10 MG PO TABS: ORAL_TABLET | ORAL | 0 refills | Status: DC

## 2017-07-06 NOTE — Telephone Encounter (Signed)
Prednisone 10 mg tabs  Take 2 tabs daily with food x 5ds, then 1 tab daily with food x 5ds then STOP Office visit with NP if no better in 1 week

## 2017-07-06 NOTE — Telephone Encounter (Signed)
Spoke with pt, she has had 2 rounds of Doxycycline and RA gave her Levaquin and she is still not feeling better. She is taking the cough medications given, Tessalon and Hycodan, but still feels like she cannot shake the cough. She is  wheezing coughing, unable to sleep, and she will try anything at this point because it has been going on for 3 weeks. Please advise.   [facility]  Instructions      Return in about 6 months (around 12/21/2017) for TP.   Refill on albuterol. Hycodan cough syrup 5 mL twice daily as needed for cough #120 ml Be careful with other sedating medications that you are on      After Visit Summary (Printed 06/23/2017)

## 2017-07-06 NOTE — Telephone Encounter (Signed)
Called pt and advised message from the provider. Pt understood and verbalized understanding. Nothing further is needed.   Rx sent to her pharmacy

## 2017-07-13 ENCOUNTER — Telehealth: Payer: Self-pay | Admitting: Internal Medicine

## 2017-07-13 NOTE — Telephone Encounter (Signed)
I informed on the phone when the [facility] called this was not something we could handled. The doctor who is ordering it would have to send her somewhere. Dr. Alain Marion could not call in a order anywhere, besides he is out of office this week. If you have something to add or would like to follow up with patient, please do. Thank you.

## 2017-07-13 NOTE — Telephone Encounter (Signed)
Noted, that is perfect thank you!

## 2017-07-13 NOTE — Telephone Encounter (Signed)
Copied from [place] [id number]. Topic: Quick Communication - See Telephone Encounter >> Jul 13, 2017  9:46 AM Arletha Grippe wrote: CRM for notification. See Telephone encounter for:   07/13/17. Pt called - she has a paper order from Dr Saverio Danker at [facility] for a CT of her left ankle without contrast.  She would like to know where she can get that done locally so that she does not have to drive to [place] for it.  Please call pt at [phone]

## 2017-07-16 ENCOUNTER — Other Ambulatory Visit: Payer: Self-pay | Admitting: Orthopaedic Surgery

## 2017-07-16 ENCOUNTER — Ambulatory Visit
Admission: RE | Admit: 2017-07-16 | Discharge: 2017-07-16 | Disposition: A | Discharge status: Home / Self Care | Payer: Medicare Other | Source: Ambulatory Visit | Attending: Orthopaedic Surgery | Admitting: Orthopaedic Surgery

## 2017-07-16 DIAGNOSIS — M25472 Effusion, left ankle: Secondary | ICD-10-CM | POA: Diagnosis not present

## 2017-07-16 DIAGNOSIS — M19172 Post-traumatic osteoarthritis, left ankle and foot: Secondary | ICD-10-CM

## 2017-08-07 ENCOUNTER — Encounter: Payer: Self-pay | Admitting: Family Medicine

## 2017-08-07 ENCOUNTER — Ambulatory Visit (INDEPENDENT_AMBULATORY_CARE_PROVIDER_SITE_OTHER): Payer: Medicare Other | Admitting: Family Medicine

## 2017-08-07 DIAGNOSIS — J189 Pneumonia, unspecified organism: Secondary | ICD-10-CM | POA: Diagnosis not present

## 2017-08-07 MED ORDER — ALBUTEROL SULFATE HFA 108 (90 BASE) MCG/ACT IN AERS: 2.0000 | INHALATION_SPRAY | RESPIRATORY_TRACT | 2 refills | Status: DC | PRN

## 2017-08-07 MED ORDER — BENZONATATE 200 MG PO CAPS: ORAL_CAPSULE | ORAL | 2 refills | Status: DC

## 2017-08-07 MED ORDER — SULFAMETHOXAZOLE-TRIMETHOPRIM 800-160 MG PO TABS: 1.0000 | ORAL_TABLET | Freq: Two times a day (BID) | ORAL | 0 refills | Status: DC

## 2017-08-07 NOTE — Progress Notes (Signed)
   Subjective:    Patient ID: Bianca Haley, female    DOB: Aug 11, 1950, 67 y.o.   MRN: [id number]  HPI Cough- 'i'm prone to PNA'.  'i'm coughing like crazy, i'm wheezing'.  sxs started ~7-10 days ago.  Having to sleep sitting up.  No fevers.  Cough is intermittently productive of clear sputum.  Denies sick contacts.  Denies sinus pain/pressure.  Using albuterol inhaler w/ some relief of wheezing.  Pt has not tried any OTC cough medications- good relief w/ Tessalon.  Diarrhea- 'I got that under control w/ Immodium'.  Not interested in discussing today   Review of Systems For ROS see HPI     Objective:   Physical Exam  Constitutional: She is oriented to person, place, and time. She appears well-developed and well-nourished. No distress.  HENT:  Head: Normocephalic and atraumatic.  TMs normal bilaterally Mild nasal congestion Throat w/out erythema, edema, or exudate  Eyes: Pupils are equal, round, and reactive to light. Conjunctivae and EOM are normal.  Neck: Normal range of motion. Neck supple.  Cardiovascular: Normal rate, regular rhythm, normal heart sounds and intact distal pulses.  No murmur heard. Pulmonary/Chest: Effort normal. No respiratory distress. She has no wheezes.  + hacking cough Crackles over RLL  Lymphadenopathy:    She has no cervical adenopathy.  Neurological: She is alert and oriented to person, place, and time.  Vitals reviewed.         Assessment & Plan:  Suspected PNA- given pt's hx of aspiration PNA and her crackles at the R base, will start Bactrim DS as pt did not feel that Doxy was effective the last time she took it.  Refill provided on Albuterol and Tessalon at pt's request.  Reviewed supportive care and red flags that should prompt return.  Pt expressed understanding and is in agreement w/ plan.

## 2017-08-07 NOTE — Patient Instructions (Signed)
Follow up as needed or as scheduled START the Bactrim twice daily- take w/ food Drink plenty of fluids Use your albuterol inhaler as needed Continue the Tessalon for cough- add Robitussin or Delsym Call with any questions or concerns Hang in there!!!

## 2017-08-21 IMAGING — DX DG CHEST 2V
2 series · 2 of 2 positions shown · non-contrast
Comparison: Chest x-ray of 03/07/2015, and CT chest of 12/21/2014

CLINICAL DATA: History of recurrent pneumonia, hypertension

EXAM:
CHEST  2 VIEW

[chest pa]
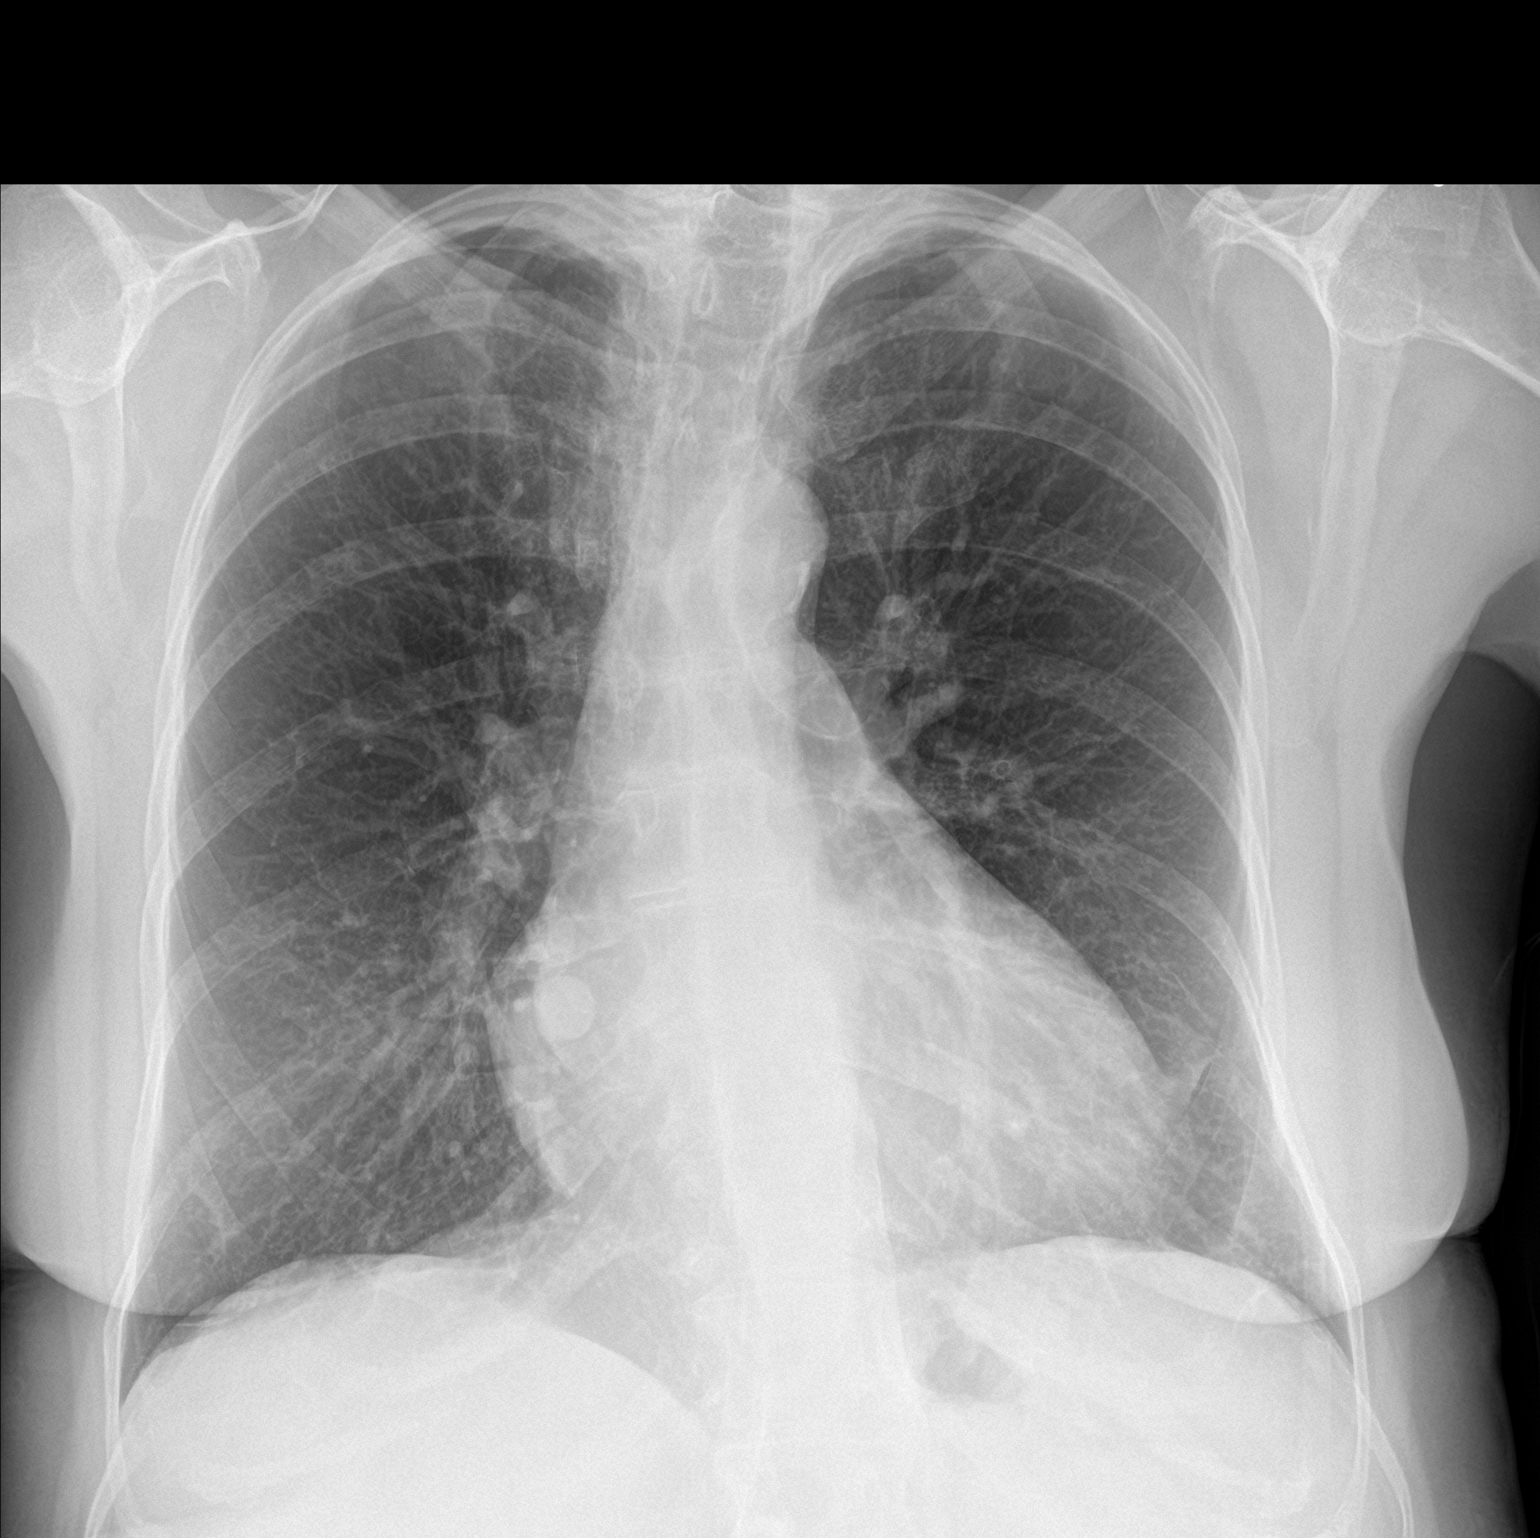

[chest lat]
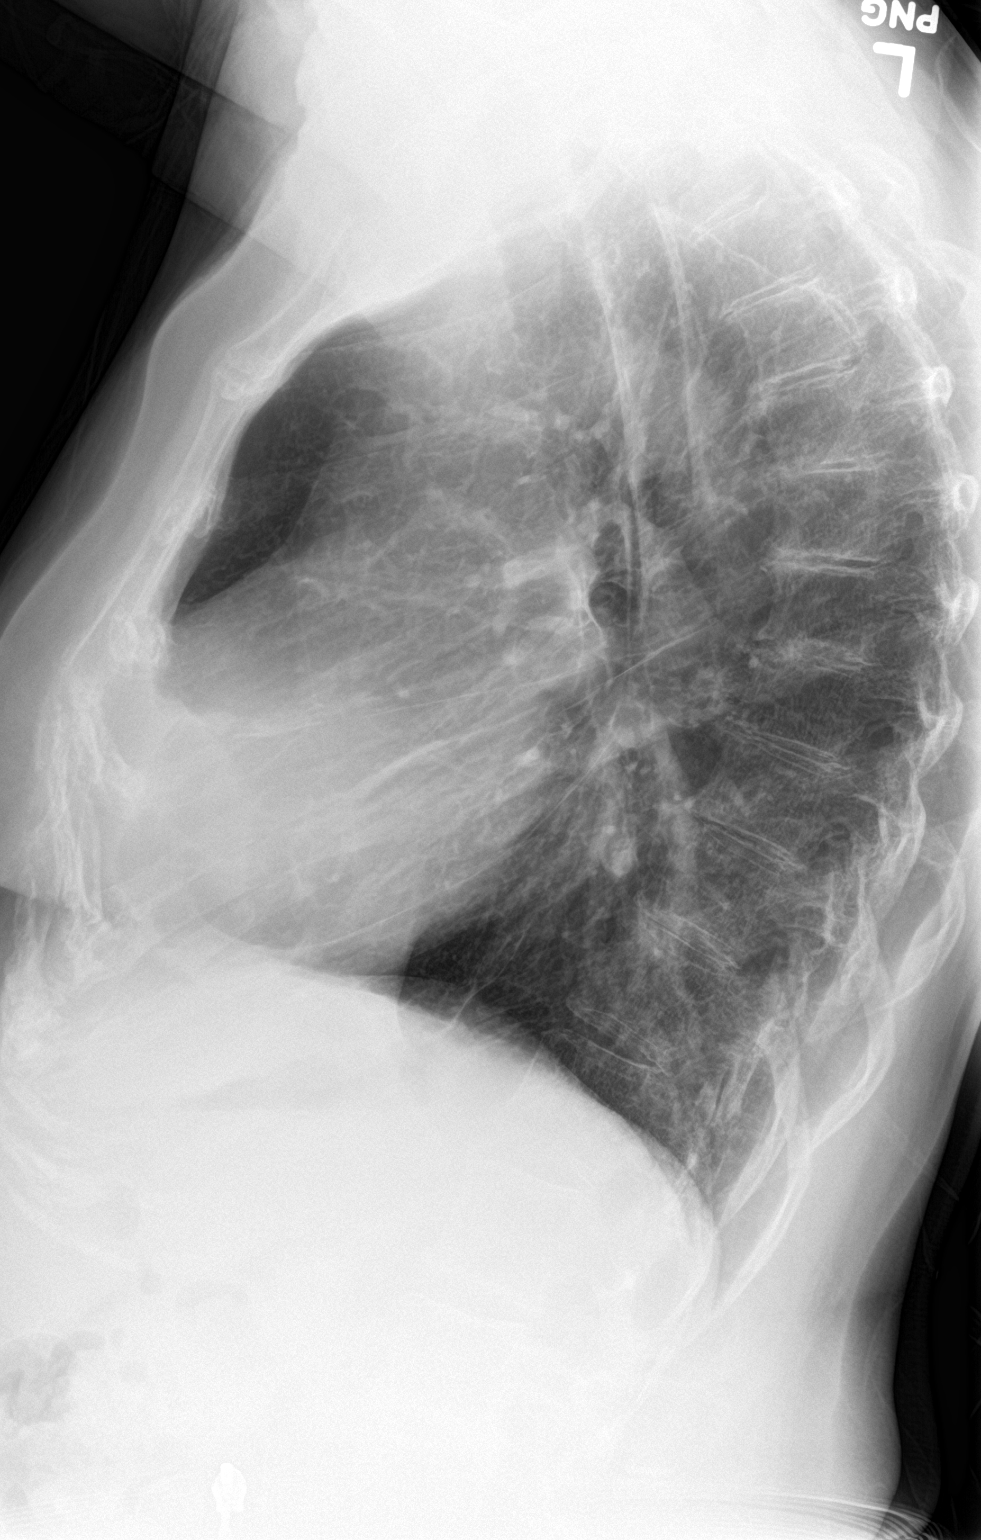

[2 of 2 positions shown; findings below may reference images not displayed]

FINDINGS: Chronic changes are stable bilaterally. No active infiltrate or
effusion is seen. A rounded calcification overlying the right heart
shadow on the frontal view only has been present previously and is
consistent with a benign process. Mediastinal and hilar contours are
unremarkable. Heart size is stable being mildly enlarged. The bones
are osteopenic.
IMPRESSION: No active lung disease.  Stable chronic change.

## 2017-08-27 ENCOUNTER — Telehealth: Payer: Self-pay | Admitting: Internal Medicine

## 2017-08-27 NOTE — Telephone Encounter (Signed)
Does Bianca Haley want to see a psychologist? Thx

## 2017-08-27 NOTE — Telephone Encounter (Signed)
Please advise 

## 2017-08-27 NOTE — Telephone Encounter (Signed)
PT would like a referral  for anxiety she says she needs guidance upcoming surgery makes it hard to eat. Not day to day but certain things coming up  puts her stomach in knots she also says he puts her trust in Dr Alain Marion and if he can help her that's fine and if he needs to refer her elsewhere for this that's fine

## 2017-08-30 DIAGNOSIS — M19072 Primary osteoarthritis, left ankle and foot: Secondary | ICD-10-CM | POA: Diagnosis not present

## 2017-08-31 DIAGNOSIS — Z79899 Other long term (current) drug therapy: Secondary | ICD-10-CM | POA: Diagnosis not present

## 2017-08-31 DIAGNOSIS — M19072 Primary osteoarthritis, left ankle and foot: Secondary | ICD-10-CM | POA: Diagnosis not present

## 2017-08-31 DIAGNOSIS — K219 Gastro-esophageal reflux disease without esophagitis: Secondary | ICD-10-CM | POA: Diagnosis not present

## 2017-08-31 DIAGNOSIS — M25572 Pain in left ankle and joints of left foot: Secondary | ICD-10-CM | POA: Diagnosis not present

## 2017-08-31 DIAGNOSIS — Z8619 Personal history of other infectious and parasitic diseases: Secondary | ICD-10-CM | POA: Diagnosis not present

## 2017-08-31 DIAGNOSIS — G8929 Other chronic pain: Secondary | ICD-10-CM | POA: Diagnosis not present

## 2017-08-31 DIAGNOSIS — Z981 Arthrodesis status: Secondary | ICD-10-CM | POA: Diagnosis not present

## 2017-08-31 DIAGNOSIS — I10 Essential (primary) hypertension: Secondary | ICD-10-CM | POA: Diagnosis not present

## 2017-09-01 ENCOUNTER — Other Ambulatory Visit: Payer: Self-pay | Admitting: Internal Medicine

## 2017-09-01 NOTE — Telephone Encounter (Signed)
Please advise 

## 2017-09-01 NOTE — Telephone Encounter (Signed)
Copied from [place] [id number]. Topic: Quick Communication - See Telephone Encounter >> Sep 01, 2017  4:36 PM Ivar Drape wrote: CRM for notification. See Telephone encounter for: 09/01/17. Patient would like to know if Dr. Alain Marion would like for her to stay on the rosuvastatin (CRESTOR) 10 MG tablet medication or not.  She stated that at one time he had taken her off of the medication.  Please advise. H: [phone] C: [id number]

## 2017-09-01 NOTE — Telephone Encounter (Signed)
LMTCB

## 2017-09-02 NOTE — Telephone Encounter (Signed)
She can take 5 mg/d Thx

## 2017-09-03 MED ORDER — ROSUVASTATIN CALCIUM 5 MG PO TABS: 5.0000 mg | ORAL_TABLET | Freq: Every day | ORAL | 3 refills | Status: DC

## 2017-09-03 NOTE — Telephone Encounter (Signed)
Please advise about refill

## 2017-09-03 NOTE — Telephone Encounter (Signed)
Sorry, I would not feel comfortable with a rx refill for opana since last refill was 2015

## 2017-09-03 NOTE — Telephone Encounter (Signed)
Please advise about opana RX in Dr. Enis Slipper absence. Pt asked about RX while on phone

## 2017-09-09 ENCOUNTER — Telehealth: Payer: Self-pay

## 2017-09-09 DIAGNOSIS — R413 Other amnesia: Secondary | ICD-10-CM

## 2017-09-09 NOTE — Telephone Encounter (Signed)
Please advise  Copied from [place] [phone]. Topic: Referral - Request >> Sep 09, 2017  3:15 PM Scherrie Gerlach wrote: Reason for CRM: husband called to request referral to neurologist for a base line of memory loss. They would like to see Dr Rexene Alberts. [facility]

## 2017-09-13 NOTE — Telephone Encounter (Signed)
Ok done Thx 

## 2017-09-29 DIAGNOSIS — M5416 Radiculopathy, lumbar region: Secondary | ICD-10-CM | POA: Diagnosis not present

## 2017-09-29 DIAGNOSIS — M5136 Other intervertebral disc degeneration, lumbar region: Secondary | ICD-10-CM | POA: Diagnosis not present

## 2017-09-29 DIAGNOSIS — M5412 Radiculopathy, cervical region: Secondary | ICD-10-CM | POA: Diagnosis not present

## 2017-09-29 DIAGNOSIS — M542 Cervicalgia: Secondary | ICD-10-CM | POA: Diagnosis not present

## 2017-10-05 ENCOUNTER — Ambulatory Visit: Payer: Medicare Other | Admitting: Neurology

## 2017-10-07 ENCOUNTER — Other Ambulatory Visit: Payer: Self-pay

## 2017-10-07 ENCOUNTER — Telehealth: Payer: Self-pay | Admitting: Internal Medicine

## 2017-10-07 MED ORDER — POTASSIUM CHLORIDE ER 10 MEQ PO TBCR
10.0000 meq | EXTENDED_RELEASE_TABLET | Freq: Every day | ORAL | 3 refills | Status: DC
Start: 2017-10-07 — End: 2018-07-05

## 2017-10-07 MED ORDER — PAROXETINE HCL 40 MG PO TABS: 40.0000 mg | ORAL_TABLET | Freq: Two times a day (BID) | ORAL | 3 refills | Status: DC

## 2017-10-07 NOTE — Telephone Encounter (Signed)
Reviewed chart pt is up-to-date sent refills to [facility].../lmb  

## 2017-10-07 NOTE — Telephone Encounter (Signed)
Copied from [place] [id number]. Topic: Quick Communication - See Telephone Encounter >> Oct 07, 2017 11:08 AM Conception Chancy, NT wrote: CRM for notification. See Telephone encounter for: 10/07/17.  Patient is calling and states that Dr. Alain Marion put her on a 30 day supply of potassium and she would like to know if he would like for her to continue taking this. She will need a refill if so.   [facility] [address] [place] [address] Phone: [phone] Fax: [phone]

## 2017-10-13 DIAGNOSIS — M76822 Posterior tibial tendinitis, left leg: Secondary | ICD-10-CM | POA: Diagnosis not present

## 2017-10-13 DIAGNOSIS — M810 Age-related osteoporosis without current pathological fracture: Secondary | ICD-10-CM | POA: Diagnosis not present

## 2017-11-09 ENCOUNTER — Telehealth: Payer: Self-pay | Admitting: *Deleted

## 2017-11-09 NOTE — Telephone Encounter (Signed)
Paroxetine 40 mg PA initiated by phone. Will receive insurance determination via fax/phone. Pt ID: [id number]

## 2017-11-10 NOTE — Telephone Encounter (Signed)
Reason for CRM: [employer] is calling stating that PARoxetine (PAXIL) 40 MG tablet is approved

## 2017-11-11 ENCOUNTER — Other Ambulatory Visit: Payer: Self-pay

## 2017-11-11 MED ORDER — PANTOPRAZOLE SODIUM 40 MG PO TBEC: 40.0000 mg | DELAYED_RELEASE_TABLET | Freq: Two times a day (BID) | ORAL | 3 refills | Status: DC

## 2017-11-23 ENCOUNTER — Ambulatory Visit (INDEPENDENT_AMBULATORY_CARE_PROVIDER_SITE_OTHER): Payer: Medicare Other | Admitting: Neurology

## 2017-11-23 ENCOUNTER — Encounter: Payer: Self-pay | Admitting: Psychology

## 2017-11-23 ENCOUNTER — Telehealth: Payer: Self-pay | Admitting: Neurology

## 2017-11-23 ENCOUNTER — Encounter: Payer: Self-pay | Admitting: Neurology

## 2017-11-23 VITALS — BP 138/74 | HR 65 | Ht 65.5 in | Wt 135.0 lb

## 2017-11-23 DIAGNOSIS — R4181 Age-related cognitive decline: Secondary | ICD-10-CM

## 2017-11-23 DIAGNOSIS — H35362 Drusen (degenerative) of macula, left eye: Secondary | ICD-10-CM | POA: Diagnosis not present

## 2017-11-23 DIAGNOSIS — H35371 Puckering of macula, right eye: Secondary | ICD-10-CM | POA: Diagnosis not present

## 2017-11-23 DIAGNOSIS — H35341 Macular cyst, hole, or pseudohole, right eye: Secondary | ICD-10-CM | POA: Diagnosis not present

## 2017-11-23 DIAGNOSIS — R419 Unspecified symptoms and signs involving cognitive functions and awareness: Secondary | ICD-10-CM | POA: Diagnosis not present

## 2017-11-23 NOTE — Telephone Encounter (Signed)
Medicare/[employer] order sent to [facility] . No auth they will reach out to the pt to schedule.

## 2017-11-23 NOTE — Patient Instructions (Addendum)
You have complaints of memory loss: memory loss or changes in cognitive function can have many reasons and does not always mean you have dementia. Conditions that can contribute to subjective or objective memory loss include: depression, stress, poor sleep from insomnia or sleep apnea, dehydration, fluctuation in blood sugar values, thyroid or electrolyte dysfunction and certain vitamin deficiencies. Dementia can be caused by stroke, brain atherosclerosis or brain vascular disease due to vascular risk factors (smoking, high blood pressure, high cholesterol, obesity and uncontrolled diabetes), certain degenerative brain disorders (including Parkinson's disease and Multiple sclerosis) and by Alzheimer's disease or other, more rare and sometimes hereditary causes. We will do some additional testing: blood work (some of which has been done by your PCP in the past 6 month) and we will do a brain scan. We won't start medication as yet. We will also request a formal cognitive test called neuropsychological evaluation which is done by a licensed neuropsychologist. We will make a referral in that regard. We will call you with brain scan test results and monitor your symptoms. Your memory loss is rather mild at this point, which, of course is reassuring.

## 2017-11-23 NOTE — Progress Notes (Signed)
Subjective:    Patient ID: Bianca Haley is a 67 y.o. female.  HPI     Star Age, MD, PhD [facility] [address]O. [place], [place] [address]   Dear Dr. Alain Marion,   I saw your patient, Bianca Haley, upon your kind request in my neurologic clinic today for initial consultation of her memory loss. Patient is accompanied by her husband today. As you know, [name] is a 67 year old right-handed woman with an underlying medical history of hypertension, history of migraines, reflux disease, back pain, on chronic narcotic pain medications, arthritis, anxiety, depression and vitamin D deficiency, who reports short term memory loss for the past few months and maybe a couple of years. Her daughter has voiced concern. Patient reports difficulty remembering names all her life. Mom had dementia in the context of N[facility].  I reviewed your office note from 05/14/2017. She had a brain MRI with and without contrast on 09/27/2013 and I reviewed the results: IMPRESSION: 1. No focal lesion along the planum sphenoidale. The abnormality on CT is likely partial voluming. 2. Normal MRI appearance of the brain for age. 3. Mild diffuse sinus disease as described. The left maxillary sinus is completely opacified. She has a Hx of anxiety. She lost her first husband at age 9 from prostate cancer. She has one daughter and one son from her first husband, ages 39 and 50, daughter lives in [place] and has 2 kids, son lives in [place] state and has no children. Sister lives in [place]. She lives with her long-term boyfriend whom I have seen before as well. She is a nonsmoker and does not drink alcohol, drinks caffeine in limitation, 1-2 cups in the mornings typically. She follows with pain management through her neurosurgeon's office. She is using her pain medication cautiously and only if needed. She is retired. She has a long-standing history of anxiety, dating back to  even childhood as she recalls. She does not have a history of ADD or ADHD as far she knows. She had left foot surgery in March last year and needed subsequent surgeries due to infection and need for hardware removal and bone graft was placed eventually.  Her Past Medical History Is Significant For: Past Medical History:  Diagnosis Date  . Anemia   . Anxiety   . Arthritis   . Back pain   . Chronic renal failure    patient denies   . GERD (gastroesophageal reflux disease)   . H/O tinnitus    left  . Headache(784.0)   . History of endometriosis   . History of uterine fibroid   . History of UTI   . Hypertension   . Migraine   . Recurrent pneumonia     Her Past Surgical History Is Significant For: Past Surgical History:  Procedure Laterality Date  . ABDOMINAL HYSTERECTOMY    . BACK SURGERY    . BLADDER SUSPENSION    . COLONOSCOPY  02/16/2008   normal  . laparoscopy     for evauation of endometriosis  . TOTAL KNEE ARTHROPLASTY Left 07/15/2015   Procedure: TOTAL LEFT KNEE ARTHROPLASTY;  Surgeon: Paralee Cancel, MD;  Location: [facility];  Service: Orthopedics;  Laterality: Left;    Her Family History Is Significant For: Family History  Problem Relation Age of Onset  . Emphysema Mother   . Heart disease Mother 87       MI    Her Social History Is Significant For: Social History  Socioeconomic History  . Marital status: Widowed    Spouse name: Not on file  . Number of children: 2  . Years of education: 69  . Highest education level: Not on file  Occupational History  . Occupation: Futures trader  Social Needs  . Financial resource strain: Not on file  . Food insecurity:    Worry: Not on file    Inability: Not on file  . Transportation needs:    Medical: Not on file    Non-medical: Not on file  Tobacco Use  . Smoking status: Never Smoker  . Smokeless tobacco: Never Used  Substance and Sexual Activity  . Alcohol use: No    Alcohol/week: 0.0 oz    Comment: no  drinking for sevral months  . Drug use: No  . Sexual activity: Yes    Partners: Male  Lifestyle  . Physical activity:    Days per week: Not on file    Minutes per session: Not on file  . Stress: Not on file  Relationships  . Social connections:    Talks on phone: Not on file    Gets together: Not on file    Attends religious service: Not on file    Active member of club or organization: Not on file    Attends meetings of clubs or organizations: Not on file    Relationship status: Not on file  Other Topics Concern  . Not on file  Social History Narrative   HSG, [employer] BS interior design. Married '79 - 62 years/divorced. Now engaged. 1 dtr - '84 - lawyer, 1 son '89. No h/o abuse.    Her Allergies Are:  Allergies  Allergen Reactions  . Penicillins Shortness Of Breath and Rash    .Marland KitchenHas patient had a PCN reaction causing immediate rash, facial/tongue/throat swelling, SOB or lightheadedness with hypotension: No Has patient had a PCN reaction causing severe rash involving mucus membranes or skin necrosis: No Has patient had a PCN reaction that required hospitalization No Has patient had a PCN reaction occurring within the last 10 years: No If all of the above answers are "NO", then may proceed with Cephalosporin use.   [name] Morphine And Related Swelling    Sedation/Swelling described like edema/bloating/doesn't help the pain Morphine only; tolerates other opioids.   . Cefepime Rash    Reaction while taking both cefepime and vancomycin at [facility] (after 3 days)  . Vancomycin Rash    Reaction while taking both cefepime and vancomycin at [facility] (after 3 days)  :   Her Current Medications Are:  Outpatient Encounter Medications as of 11/23/2017  Medication Sig  . acetaminophen (TYLENOL) 325 MG tablet Take 1-2 tablets (325-650 mg total) by mouth every 6 (six) hours as needed for mild pain or moderate pain.  [name] albuterol (PROVENTIL HFA;VENTOLIN HFA) 108 (90 Base) MCG/ACT inhaler Inhale 2 puffs  into the lungs every 4 (four) hours as needed for wheezing or shortness of breath.  [name] aspirin 325 MG tablet Take 325 mg by mouth 2 (two) times daily.  [name] atenolol (TENORMIN) 100 MG tablet TAKE 1 TABLET (100 MG) BY MOUTH EVERY DAY  . b complex vitamins tablet Take 1 tablet daily by mouth.  . benzonatate (TESSALON) 200 MG capsule One every  8 hours as needed  . Cholecalciferol (VITAMIN D) 2000 units tablet Take 2,000 Units by mouth daily.  . diazepam (VALIUM) 5 MG tablet Take 5 mg by mouth as needed for anxiety or muscle spasms.   [name]  diltiazem (CARDIZEM CD) 180 MG 24 hr capsule Take 1 capsule (180 mg total) by mouth daily.  [name] gabapentin (NEURONTIN) 300 MG capsule Take 600 capsules by mouth 3 (three) times daily.   . Multiple Vitamin (MULTIVITAMIN WITH MINERALS) TABS tablet Take 1 tablet by mouth daily.  [name] OVER THE COUNTER MEDICATION Place 1 application into both eyes at bedtime as needed (dry eyes). Over the counter lubricant eye gel  . oxymorphone (OPANA) 10 MG tablet Take 5 mg by mouth as needed for pain. Reported on 09/25/2015  . pantoprazole (PROTONIX) 40 MG tablet Take 1 tablet (40 mg total) by mouth 2 (two) times daily before a meal.  . PARoxetine (PAXIL) 40 MG tablet Take 1 tablet (40 mg total) by mouth 2 (two) times daily.  . polyethylene glycol (MIRALAX / GLYCOLAX) packet Take 17 g by mouth 2 (two) times daily. (Patient taking differently: Take 17 g by mouth daily as needed (constipation). Mix in 8 oz liquid and drink)  . potassium chloride (KLOR-CON 10) 10 MEQ tablet Take 1 tablet (10 mEq total) by mouth daily.  . rosuvastatin (CRESTOR) 5 MG tablet Take 1 tablet (5 mg total) by mouth daily.  . SUMAtriptan (IMITREX) 100 MG tablet Take 1 tablet (100 mg total) by mouth daily as needed for migraine. Take 1 tablet (100 mg) by mouth as needed for migraine. May repeat in 2 hours if headache persists or recurs.  . traZODone (DESYREL) 100 MG tablet Take 1 tablet (100 mg total) at bedtime by mouth.  .  [DISCONTINUED] sulfamethoxazole-trimethoprim (BACTRIM DS,SEPTRA DS) 800-160 MG tablet Take 1 tablet by mouth 2 (two) times daily.   No facility-administered encounter medications on file as of 11/23/2017.    Review of Systems:  Out of a complete 14 point review of systems, all are reviewed and negative with the exception of these symptoms as listed below:  Review of Systems  Neurological:       Pt presents today to discuss her lifelong short term memory difficulty. Pt has difficulty remembering names. Pt is asking for a general neurologic exam.    Objective:  Neurological Exam  Physical Exam Physical Examination:   Vitals:   11/23/17 0956  BP: 138/74  Pulse: 65    General Examination: The patient is a very pleasant 67 y.o. female in no acute distress. She appears well-developed and well-nourished and well groomed.   HEENT: Normocephalic, atraumatic, pupils are equal, round and reactive to light and accommodation. Corrective eye glasses. Extraocular tracking is good without limitation to gaze excursion or nystagmus noted. Normal smooth pursuit is noted. Hearing is grossly intact. Face is symmetric with normal facial animation and normal facial sensation. Speech is clear with no dysarthria noted. There is no hypophonia. There is no lip, neck/head, jaw or voice tremor. Neck is supple with full range of passive and active motion. There are no carotid bruits on auscultation. Oropharynx exam reveals: moderate mouth dryness, adequate dental hygiene. Tongue protrudes centrally and palate elevates symmetrically.   Chest: Clear to auscultation without wheezing, rhonchi or crackles noted.  Heart: S1+S2+0, regular and normal without murmurs, rubs or gallops noted.   Abdomen: Soft, non-tender and non-distended with normal bowel sounds appreciated on auscultation.  Extremities: There is trace pitting edema in the L ankle only.   Skin: Warm and dry without trophic changes  noted.  Musculoskeletal: exam reveals no obvious joint deformities, tenderness or joint swelling or erythema.   Neurologically:  Mental status: The patient is awake, alert and  oriented except for exact date. Her immediate and remote memory, attention, language skills and fund of knowledge are fairly appropriate. There is no evidence of aphasia, agnosia, apraxia or anomia. Speech is clear with normal prosody and enunciation. Thought process is linear. Mood is normal and affect is normal.   On 11/23/2017: MMSE: 25/30, she declined serial sevens and could not fully spelled the word world backwards. She was not fully oriented to exact date. CDT: 4/4, AFT: 10/min.  Cranial nerves II - XII are as described above under HEENT exam. In addition: shoulder shrug is normal with equal shoulder height noted. Motor exam: Normal bulk, strength and tone is noted. There is no drift, tremor or rebound. Romberg is negative. Reflexes are 1+ throughout. Fine motor skills and coordination: intact with normal finger taps, normal hand movements, normal rapid alternating patting, normal foot taps and normal foot agility.  Cerebellar testing: No dysmetria or intention tremor on finger to nose testing. Heel to shin is unremarkable bilaterally. There is no truncal or gait ataxia.  Sensory exam: intact to light touch in the upper and lower extremities.  Gait, station and balance: She stands easily. No veering to one side is noted. No leaning to one side is noted. Posture is slightly stooped, mild scoliosis as possible. She walks cautiously and slowly, slight limp on the left. Assessment and Plan:   In summary, SHALEE PAOLO is a very pleasant 67 y.o.-year old female with an underlying medical history of hypertension, history of migraines, reflux disease, back pain, on chronic narcotic pain medications, arthritis, anxiety, depression and vitamin D deficiency, who presents for evaluation of her short-term memory loss. Findings are  generally quite benign, her memory loss is in the mild range, could be in the realm of age-appropriate memory loss, exacerbated by stress/anxiety, and mild cognitive impairment could be in the differential as well. She does not have significant vascular risk factors. We talked about my findings and the diagnosis of memory loss and dementia, its prognosis and treatment options. We mutually agreed to not start a new medication quite yet. I would like to proceed with further testing in the form of blood work, brain MRI and neuropsychological evaluation with a neuropsychologist. I made a referral in that regard. She was in agreement with the plan. We talked about non-pharmacological approaches. We talked about maintaining a healthy lifestyle in general and staying active mentally and physically. I encouraged the patient to eat healthy, exercise daily and keep well hydrated, to keep a scheduled bedtime and wake time routine, to not skip any meals and eat healthy snacks in between meals and to have protein with every meal. I stressed the importance of regular exercise, within of course the patient's own mobility limitations. Suggested a six-month follow-up routinely with one of her nurse practitioners, we will keep her posted as to her test results in the interim. I answered all her questions today and she was in agreement. Thank you very much for allowing me to participate in the care of this nice patient. If I can be of any further assistance to you please do not hesitate to call me at [phone].  Sincerely,   Star Age, MD, PhD

## 2017-11-24 LAB — COMPREHENSIVE METABOLIC PANEL
ALBUMIN: 4.5 g/dL (ref 3.6–4.8)
ALT: 6 IU/L (ref 0–32)
AST: 18 IU/L (ref 0–40)
Albumin/Globulin Ratio: 2 (ref 1.2–2.2)
Alkaline Phosphatase: 110 IU/L (ref 39–117)
BILIRUBIN TOTAL: 0.2 mg/dL (ref 0.0–1.2)
BUN / CREAT RATIO: 22 (ref 12–28)
BUN: 20 mg/dL (ref 8–27)
CALCIUM: 9.6 mg/dL (ref 8.7–10.3)
CHLORIDE: 98 mmol/L (ref 96–106)
CO2: 29 mmol/L (ref 20–29)
Creatinine, Ser: 0.92 mg/dL (ref 0.57–1.00)
GFR calc Af Amer: 73 mL/min/{1.73_m2} (ref >59)
GFR calc non Af Amer: 64 mL/min/{1.73_m2} (ref >59)
GLOBULIN, TOTAL: 2.3 g/dL (ref 1.5–4.5)
GLUCOSE: 68 mg/dL (ref 65–99)
Potassium: 5 mmol/L (ref 3.5–5.2)
SODIUM: 142 mmol/L (ref 134–144)
TOTAL PROTEIN: 6.8 g/dL (ref 6.0–8.5)

## 2017-11-24 LAB — B12 AND FOLATE PANEL
Folate: >20 ng/mL (ref >3.0)
Vitamin B-12: 820 pg/mL (ref 232–1245)

## 2017-11-24 LAB — HGB A1C W/O EAG: Hgb A1c MFr Bld: 5.9 % — ABNORMAL HIGH (ref 4.8–5.6)

## 2017-11-24 LAB — TSH: TSH: 3.57 u[IU]/mL (ref 0.450–4.500)

## 2017-11-24 LAB — RPR: RPR: NONREACTIVE

## 2017-11-25 ENCOUNTER — Telehealth: Payer: Self-pay

## 2017-11-25 NOTE — Telephone Encounter (Signed)
I called pt, advised her that her recent labs were within normal limits, except her HgA1C was a little elevated, in the pre-diabetes range, for which pt should discuss this with her PCP. Pt asked that I send a copy of these results to Dr. Alain Marion. Pt verbalized understanding of results. Pt had no questions at this time but was encouraged to call back if questions arise.

## 2017-11-25 NOTE — Telephone Encounter (Signed)
-----   Message from Star Age, MD sent at 11/25/2017  8:03 AM EDT ----- Please call and advise the patient that the recent labs we checked were within normal limits with the exception of A1c/diabetes marker in the pre-diabetes range. No further action is required on these tests at this time other than dietary changes to reduce risk for diabetes, she can talk to PCP about this at next visit too.   Thanks,  Star Age, MD, PhD

## 2017-11-25 NOTE — Progress Notes (Signed)
Please call and advise the patient that the recent labs we checked were within normal limits with the exception of A1c/diabetes marker in the pre-diabetes range. No further action is required on these tests at this time other than dietary changes to reduce risk for diabetes, she can talk to PCP about this at next visit too.   Thanks,  Star Age, MD, PhD

## 2017-12-13 ENCOUNTER — Telehealth: Payer: Self-pay | Admitting: Pulmonary Disease

## 2017-12-13 MED ORDER — DOXYCYCLINE HYCLATE 100 MG PO TABS: 100.0000 mg | ORAL_TABLET | Freq: Two times a day (BID) | ORAL | 0 refills | Status: DC

## 2017-12-13 NOTE — Telephone Encounter (Signed)
Doxy 100 bid x 7 ds

## 2017-12-13 NOTE — Telephone Encounter (Signed)
Pt c/o increased prod cough with green mucus, crackles in lungs X4 days.   Denies fever, chest pain, chills, body aches.   Has been taking mucinex, aspirin, and tessalon perles.  Requesting additional recs.   Pharmacy: [facility] on [address].   RA please advise on recs.  Thanks!

## 2017-12-13 NOTE — Telephone Encounter (Signed)
Pt aware of recs.  rx sent to preferred pharmacy.  Nothing further needed.  

## 2017-12-14 ENCOUNTER — Telehealth: Payer: Self-pay | Admitting: Pulmonary Disease

## 2017-12-14 NOTE — Telephone Encounter (Signed)
Spoke with pt, clarified recs from yesterday.  Nothing further needed.

## 2017-12-15 ENCOUNTER — Other Ambulatory Visit: Payer: Self-pay | Admitting: *Deleted

## 2017-12-15 MED ORDER — ATENOLOL 100 MG PO TABS
ORAL_TABLET | ORAL | 1 refills | Status: DC
Start: 2017-12-15 — End: 2018-06-09

## 2017-12-24 ENCOUNTER — Ambulatory Visit: Payer: Medicare Other | Admitting: Adult Health

## 2017-12-28 ENCOUNTER — Ambulatory Visit
Admission: RE | Admit: 2017-12-28 | Discharge: 2017-12-28 | Disposition: A | Discharge status: Home / Self Care | Payer: Medicare Other | Source: Ambulatory Visit | Attending: Neurology | Admitting: Neurology

## 2017-12-28 DIAGNOSIS — R419 Unspecified symptoms and signs involving cognitive functions and awareness: Secondary | ICD-10-CM | POA: Diagnosis not present

## 2017-12-28 MED ORDER — GADOBENATE DIMEGLUMINE 529 MG/ML IV SOLN
12.0000 mL | Freq: Once | INTRAVENOUS | Status: AC | PRN
  Administered 2017-12-28: 12 mL via INTRAVENOUS

## 2017-12-29 ENCOUNTER — Other Ambulatory Visit: Payer: Self-pay | Admitting: Internal Medicine

## 2017-12-29 MED ORDER — DILTIAZEM HCL ER COATED BEADS 180 MG PO CP24: 180.0000 mg | ORAL_CAPSULE | Freq: Every day | ORAL | 1 refills | Status: DC

## 2017-12-29 NOTE — Telephone Encounter (Signed)
Rx refill request: Cardizem CD 180 mg          Last filled: 06/03/17  LOV: 05/14/17 patient to f/u 3 months  PCP:  [place]nokov  Pharmacy: verified

## 2017-12-29 NOTE — Telephone Encounter (Signed)
Reviewed chart pt is up-to-date sent refills to [facility].../lmb  

## 2017-12-29 NOTE — Telephone Encounter (Signed)
Copied from [place] [phone] [id number]. Topic: Quick Communication - Rx Refill/Question >> Dec 29, 2017 11:14 AM Margot Ables wrote: Medication: diltiazem (CARDIZEM CD) 180 MG 24 hr capsule  - pt is out - pt contacted pharmacy for refill as well  Has the patient contacted their pharmacy? Yes - pharmacy advised pt to call to expedite Preferred Pharmacy (with phone number or street name): [facility] [place], [place] [phone] (Phone) [phone] (Fax)

## 2017-12-30 ENCOUNTER — Telehealth: Payer: Self-pay

## 2017-12-30 DIAGNOSIS — M542 Cervicalgia: Secondary | ICD-10-CM | POA: Diagnosis not present

## 2017-12-30 DIAGNOSIS — M5412 Radiculopathy, cervical region: Secondary | ICD-10-CM | POA: Diagnosis not present

## 2017-12-30 DIAGNOSIS — I10 Essential (primary) hypertension: Secondary | ICD-10-CM | POA: Diagnosis not present

## 2017-12-30 DIAGNOSIS — M5136 Other intervertebral disc degeneration, lumbar region: Secondary | ICD-10-CM | POA: Diagnosis not present

## 2017-12-30 NOTE — Telephone Encounter (Signed)
I called pt and discussed her MRI results. Pt is agreeable to pushing her appt out past 06/20/17 and a new appt was made with Dr. Rexene Alberts for 06/28/17 at 1:00pm. Pt verbalized understanding of results. Pt had no questions at this time but was encouraged to call back if questions arise.

## 2017-12-30 NOTE — Telephone Encounter (Signed)
-----   Message from Star Age, MD sent at 12/30/2017  1:35 PM EDT ----- Please call patient regarding the recent brain MRI: The brain scan showed a normal structure of the brain and mild volume loss which we call atrophy. There were changes in the deeper structures of the brain, which we call white matter changes or microvascular changes. These were reported as mild in Her case. These are tiny white spots, that occur with time and are seen in a variety of conditions, including with normal aging, chronic hypertension, chronic headaches, especially migraine HAs, chronic diabetes, chronic hyperlipidemia. These are not strokes and no mass or lesion or contrast enhancement was seen which is reassuring. Again, there were no acute findings, such as a stroke, or mass or blood products. No further action is required on this test at this time, other than re-enforcing the importance of good blood pressure control, good cholesterol control, good blood sugar control, and weight management.   Overall, fairly expected and age appropriate findings.  She has neuropsychological testing in January 2020. She has follow-up appointment with Cecille Rubin shortly before that. Since we will probably have to reschedule Carolyn's appointment anyway, please offer her an appointment after 06/20/2017 as she will have her neuropsychological appointments completed at that point.   Thanks,  Star Age, MD, PhD

## 2017-12-30 NOTE — Progress Notes (Signed)
Please call patient regarding the recent brain MRI: The brain scan showed a normal structure of the brain and mild volume loss which we call atrophy. There were changes in the deeper structures of the brain, which we call white matter changes or microvascular changes. These were reported as mild in Her case. These are tiny white spots, that occur with time and are seen in a variety of conditions, including with normal aging, chronic hypertension, chronic headaches, especially migraine HAs, chronic diabetes, chronic hyperlipidemia. These are not strokes and no mass or lesion or contrast enhancement was seen which is reassuring. Again, there were no acute findings, such as a stroke, or mass or blood products. No further action is required on this test at this time, other than re-enforcing the importance of good blood pressure control, good cholesterol control, good blood sugar control, and weight management.   Overall, fairly expected and age appropriate findings.  She has neuropsychological testing in January 2020. She has follow-up appointment with Cecille Rubin shortly before that. Since we will probably have to reschedule Carolyn's appointment anyway, please offer her an appointment after 06/20/2017 as she will have her neuropsychological appointments completed at that point.   Thanks,  Star Age, MD, PhD

## 2017-12-31 ENCOUNTER — Encounter: Payer: Self-pay | Admitting: *Deleted

## 2018-01-04 ENCOUNTER — Encounter: Payer: Self-pay | Admitting: Primary Care

## 2018-01-04 ENCOUNTER — Ambulatory Visit (INDEPENDENT_AMBULATORY_CARE_PROVIDER_SITE_OTHER): Payer: Medicare Other | Admitting: Primary Care

## 2018-01-04 ENCOUNTER — Other Ambulatory Visit: Payer: Self-pay

## 2018-01-04 DIAGNOSIS — J189 Pneumonia, unspecified organism: Secondary | ICD-10-CM | POA: Diagnosis not present

## 2018-01-04 DIAGNOSIS — Z23 Encounter for immunization: Secondary | ICD-10-CM

## 2018-01-04 NOTE — Assessment & Plan Note (Addendum)
CT chest wo contrast 12/21/14- showed some persistent scattered sub 9mm pulmonary nodules and areas of bronchietasis. In low risk patients, no follow up needed. This recommendation follows the consensus statement: Guidelines for Management of Small Pulmonary Nodules Detected on CT Scans: A Statement from the [place] as published in Radiology 2005; 237:395-400.  Patient has never smoked, considered low risk

## 2018-01-04 NOTE — Assessment & Plan Note (Addendum)
-   Doing well today, has occasional productive cough with sputum production requiring abx .  - Most recently completed doxy course August 19th - Continues PPI - Received high dose influenza vaccine today

## 2018-01-04 NOTE — Progress Notes (Signed)
@Patient  ID: Bianca Haley, female    DOB: 12/05/1950, 67 y.o.   MRN: [id number]  Chief Complaint  Patient presents with  . Follow-up    6 month follow up    Referring provider: Plotnikov, [name], MD  HPI: 67 year old female, never smoked. PMH recurrent PNA ?aspiration, esophageal dysmotility. Patient of Dr. Elsworth Soho, last seen Feb 20th 2019. Completed Levaquin and prednisone course end of feb after office visit for persistent cough. CXR during that time showed no active cardiopulmonary disease. Most recently called office on 08/12 with reports of productive cough with green mucus, wheezing in lungs. Started on Doxycycline x 7 days.   01/04/2018  Presents today for 6 month office visit. States that her sister was sick with URI symptoms back in February and believes she got sick from contact with her. She is feeling well today. 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 No other complaints. Received influenza vaccine today.   Of note, when patient refers to "crackles" she means wheezing   Significant tests/ events >> 02/2013 CT chest showed RML/RLL pneumonia and what appeared to be some progression of bronchiectasis (first noted 2011)  >> Esophagram was negative. IgG,IgaA and IgM levels were low, but not immunosuppressed.  >> 09/2013 admitted for recurrent PNA /aspiration pneumonitis . CXR showed R>L consolidation c/w multifocal PNA.  >> MBS >> dysphagia but raised concerns of possible primary esophageal dysmotility as barium pill stopped mid esophagus requiring additional thin barium to facilitate transit to stomach , PPI to BID prior to discharge.  >> 04/2014 EGD showed mild esophagitis. >> 2015 BL severe pneumonia when visiting son in [place]  >> 08/2014 admitted for pneumonia >> 12/21/2014 - CT Chest wo contrast- Improved CT appearance of the lungs ith resolution of infiltrates and inflammation. There are some persistent scattered sub 32mm pulmonary  nodules and areas of bronchiectasis. In low risk patients, no follow up needed.  .   Allergies  Allergen Reactions  . Penicillins Shortness Of Breath and Rash    .Marland KitchenHas patient had a PCN reaction causing immediate rash, facial/tongue/throat swelling, SOB or lightheadedness with hypotension: No Has patient had a PCN reaction causing severe rash involving mucus membranes or skin necrosis: No Has patient had a PCN reaction that required hospitalization No Has patient had a PCN reaction occurring within the last 10 years: No If all of the above answers are "NO", then may proceed with Cephalosporin use.   [name] Morphine And Related Swelling    Sedation/Swelling described like edema/bloating/doesn't help the pain Morphine only; tolerates other opioids.   . Cefepime Rash    Reaction while taking both cefepime and vancomycin at [facility] (after 3 days)  . Vancomycin Rash    Reaction while taking both cefepime and vancomycin at [facility] (after 3 days)    Immunization History  Administered Date(s) Administered  . Influenza Split 02/27/2015  . Influenza Whole 01/30/2009  . Influenza, High Dose Seasonal PF 02/08/2017, 01/04/2018  . Influenza, Seasonal, Injecte, Preservative Fre 02/27/2015  . Influenza,inj,Quad PF,6+ Mos 01/26/2013, 01/26/2014, 02/21/2016  . Influenza-Unspecified 01/26/2013, 01/26/2014, 02/21/2016, 02/09/2017  . Pneumococcal Conjugate-13 01/26/2013  . Pneumococcal Polysaccharide-23 03/23/2013  . Td 01/30/2009    Past Medical History:  Diagnosis Date  . Anemia   . Anxiety   . Arthritis   . Back pain   . Chronic renal failure    patient denies   . GERD (gastroesophageal reflux disease)   . H/O tinnitus  left  . Headache(784.0)   . History of endometriosis   . History of uterine fibroid   . History of UTI   . Hypertension   . Migraine   . Recurrent pneumonia     Tobacco History: Social History   Tobacco Use  Smoking Status Never Smoker  Smokeless Tobacco Never Used    Counseling given: Not Answered   Outpatient Medications Prior to Visit  Medication Sig Dispense Refill  . acetaminophen (TYLENOL) 325 MG tablet Take 1-2 tablets (325-650 mg total) by mouth every 6 (six) hours as needed for mild pain or moderate pain.    [name] albuterol (PROVENTIL HFA;VENTOLIN HFA) 108 (90 Base) MCG/ACT inhaler Inhale 2 puffs into the lungs every 4 (four) hours as needed for wheezing or shortness of breath. 18 g 2  . aspirin 325 MG tablet Take 325 mg by mouth 2 (two) times daily.    [name] atenolol (TENORMIN) 100 MG tablet TAKE 1 TABLET (100 MG) BY MOUTH EVERY DAY 90 tablet 1  . b complex vitamins tablet Take 1 tablet daily by mouth. 100 tablet 3  . benzonatate (TESSALON) 200 MG capsule One every  8 hours as needed 45 capsule 2  . Cholecalciferol (VITAMIN D) 2000 units tablet Take 2,000 Units by mouth daily.    . diazepam (VALIUM) 5 MG tablet Take 5 mg by mouth as needed for anxiety or muscle spasms.   2  . diltiazem (CARDIZEM CD) 180 MG 24 hr capsule Take 1 capsule (180 mg total) by mouth daily. 90 capsule 1  . gabapentin (NEURONTIN) 300 MG capsule Take 600 capsules by mouth 3 (three) times daily.     . Multiple Vitamin (MULTIVITAMIN WITH MINERALS) TABS tablet Take 1 tablet by mouth daily.    [name] OVER THE COUNTER MEDICATION Place 1 application into both eyes at bedtime as needed (dry eyes). Over the counter lubricant eye gel    . oxymorphone (OPANA) 10 MG tablet Take 5 mg by mouth as needed for pain. Reported on 09/25/2015    . pantoprazole (PROTONIX) 40 MG tablet Take 1 tablet (40 mg total) by mouth 2 (two) times daily before a meal. 180 tablet 3  . PARoxetine (PAXIL) 40 MG tablet Take 1 tablet (40 mg total) by mouth 2 (two) times daily. 180 tablet 3  . polyethylene glycol (MIRALAX / GLYCOLAX) packet Take 17 g by mouth 2 (two) times daily. (Patient taking differently: Take 17 g by mouth daily as needed (constipation). Mix in 8 oz liquid and drink) 14 each 0  . potassium chloride  (KLOR-CON 10) 10 MEQ tablet Take 1 tablet (10 mEq total) by mouth daily. 30 tablet 3  . rosuvastatin (CRESTOR) 5 MG tablet Take 1 tablet (5 mg total) by mouth daily. 90 tablet 3  . SUMAtriptan (IMITREX) 100 MG tablet Take 1 tablet (100 mg total) by mouth daily as needed for migraine. Take 1 tablet (100 mg) by mouth as needed for migraine. May repeat in 2 hours if headache persists or recurs. 12 tablet 5  . traZODone (DESYREL) 100 MG tablet Take 1 tablet (100 mg total) at bedtime by mouth. 90 tablet 1  . doxycycline (VIBRA-TABS) 100 MG tablet Take 1 tablet (100 mg total) by mouth 2 (two) times daily. 14 tablet 0   No facility-administered medications prior to visit.     Review of Systems  Review of Systems  Constitutional: Negative.   HENT: Negative.   Respiratory: Negative.   Cardiovascular: Negative.   Gastrointestinal: Negative.  Allergic/Immunologic: Negative.     Physical Exam  BP 122/82 (BP Location: Left Arm, Cuff Size: Normal)   Pulse (!) 55   Ht 5\' 4"  (1.626 m)   Wt 136 lb 12.8 oz (62.1 kg)   SpO2 92%   BMI 23.48 kg/m  Physical Exam  Constitutional: She is oriented to person, place, and time. She appears well-developed and well-nourished.  HENT:  Head: Normocephalic and atraumatic.  Eyes: Pupils are equal, round, and reactive to light. EOM are normal.  Neck: Normal range of motion. Neck supple.  Cardiovascular: Normal rate, regular rhythm and normal heart sounds.  No murmur heard. Pulmonary/Chest: Effort normal and breath sounds normal. No respiratory distress. She has no wheezes.  Abdominal: Soft. Bowel sounds are normal. There is no tenderness.  Neurological: She is alert and oriented to person, place, and time.  Skin: Skin is warm and dry. No rash noted. No erythema.  Psychiatric: She has a normal mood and affect. Her behavior is normal. Judgment normal.     Lab Results:  CBC    Component Value Date/Time   WBC 5.1 05/14/2017 1355   RBC 4.51 05/14/2017  1355   HGB 12.4 05/14/2017 1355   HCT 37.2 05/14/2017 1355   PLT 275.0 05/14/2017 1355   MCV 82.4 05/14/2017 1355   MCH 28.5 01/25/2017 1120   MCHC 33.3 05/14/2017 1355   RDW 14.4 05/14/2017 1355   LYMPHSABS 1.2 05/14/2017 1355   MONOABS 0.3 05/14/2017 1355   EOSABS 0.2 05/14/2017 1355   BASOSABS 0.0 05/14/2017 1355    BMET    Component Value Date/Time   NA 142 11/23/2017 1106   K 5.0 11/23/2017 1106   CL 98 11/23/2017 1106   CO2 29 11/23/2017 1106   GLUCOSE 68 11/23/2017 1106   GLUCOSE 108 (H) 05/14/2017 1355   BUN 20 11/23/2017 1106   CREATININE 0.92 11/23/2017 1106   CALCIUM 9.6 11/23/2017 1106   GFRNONAA 64 11/23/2017 1106   GFRAA 73 11/23/2017 1106    BNP No results found for: BNP  ProBNP No results found for: PROBNP  Imaging: Mr Jeri Cos MW Contrast  Result Date: 12/30/2017 [facility] NEURO[facility] REPORT STUDY DATE: 12/28/17 PATIENT NAME: NIMSI MALES DOB: January 18, 1951 MRN: [id number] ORDERING CLINICIAN: Star Age, MD PhD CLINICAL HISTORY: 67 year old female with memory loss. EXAM: MRI brain (with and without) TECHNIQUE: MRI of the brain with and without contrast was obtained utilizing 5 mm axial slices with T1, T2, T2 flair, SWI and diffusion weighted views.  T1 sagittal, T2 coronal and postcontrast views in the axial and coronal plane were obtained. CONTRAST: 68ml multihance COMPARISON: 09/27/13 IMAGING SITE: [facility] [address] (1.5 Tesla MRI)  FINDINGS: No abnormal lesions are seen on diffusion-weighted views to suggest acute ischemia. The cortical sulci, fissures and cisterns are notable for mild perisylvian atrophy. Lateral, third and fourth ventricle are normal in size and appearance. No extra-axial fluid collections are seen. No evidence of mass effect or midline shift.  Mild periventricular and subcortical chronic small vessel ischemic disease. No abnormal lesions are seen on post contrast views.  On sagittal views the  posterior fossa, pituitary gland and corpus callosum are unremarkable. No evidence of intracranial hemorrhage on SWI views. The orbits and their contents, paranasal sinuses and calvarium are notable for bilateral lens extractions.  Intracranial flow voids are present.   MRI brain (with and without) demonstrating: - Mild perisylvian atrophy. - Mild chronic small vessel ischemic disease. INTERPRETING PHYSICIAN:  Penni Bombard, MD Certified in Neurology, Neurophysiology and Neuroimaging [facility] [address] [place],  [address] [phone]     Assessment & Plan:   Recurrent pneumonia - Doing well today, has occasional productive cough with sputum production requiring abx .  - Most recently completed doxy course August 19th - Continues PPI - Received high dose influenza vaccine today   PULMONARY NODULE CT chest wo contrast 12/21/14- showed some persistent scattered sub 33mm pulmonary nodules and areas of bronchietasis. In low risk patients, no follow up needed. This recommendation follows the consensus statement: Guidelines for Management of Small Pulmonary Nodules Detected on CT Scans: A Statement from the [employer] as published in Radiology 2005; 237:395-400.  Patient has never smoked, considered low risk      Martyn Ehrich, NP 01/04/2018

## 2018-01-04 NOTE — Patient Instructions (Signed)
You looked well today on exam Received influenza vaccine today  Return in 6 months with Dr. Elsworth Soho

## 2018-01-19 ENCOUNTER — Other Ambulatory Visit: Payer: Self-pay | Admitting: Emergency Medicine

## 2018-01-19 DIAGNOSIS — Z981 Arthrodesis status: Secondary | ICD-10-CM | POA: Diagnosis not present

## 2018-01-19 DIAGNOSIS — M19172 Post-traumatic osteoarthritis, left ankle and foot: Secondary | ICD-10-CM | POA: Diagnosis not present

## 2018-01-19 DIAGNOSIS — M25572 Pain in left ankle and joints of left foot: Secondary | ICD-10-CM | POA: Diagnosis not present

## 2018-01-19 MED ORDER — TRAZODONE HCL 100 MG PO TABS: 100.0000 mg | ORAL_TABLET | Freq: Every day | ORAL | 0 refills | Status: DC

## 2018-01-31 ENCOUNTER — Encounter: Payer: Self-pay | Admitting: Internal Medicine

## 2018-01-31 ENCOUNTER — Ambulatory Visit (INDEPENDENT_AMBULATORY_CARE_PROVIDER_SITE_OTHER): Payer: Medicare Other | Admitting: Internal Medicine

## 2018-01-31 DIAGNOSIS — R413 Other amnesia: Secondary | ICD-10-CM | POA: Diagnosis not present

## 2018-01-31 DIAGNOSIS — I10 Essential (primary) hypertension: Secondary | ICD-10-CM | POA: Diagnosis not present

## 2018-01-31 DIAGNOSIS — F411 Generalized anxiety disorder: Secondary | ICD-10-CM

## 2018-01-31 DIAGNOSIS — N183 Chronic kidney disease, stage 3 unspecified: Secondary | ICD-10-CM

## 2018-01-31 MED ORDER — ZOSTER VAC RECOMB ADJUVANTED 50 MCG/0.5ML IM SUSR: 0.5000 mL | Freq: Once | INTRAMUSCULAR | 1 refills | Status: AC

## 2018-01-31 MED ORDER — TRAZODONE HCL 150 MG PO TABS: 150.0000 mg | ORAL_TABLET | Freq: Every day | ORAL | 3 refills | Status: DC

## 2018-01-31 NOTE — Progress Notes (Signed)
Subjective:  Patient ID: Bianca Haley, female    DOB: [birth year]  Age: 67 y.o. MRN: [id number]  CC: No chief complaint on file.   HPI [name] presents for a 6 mo f/u GERD C/o insomnia - worse, anxiety, chronic pain Moving to [place]  Outpatient Medications Prior to Visit  Medication Sig Dispense Refill  . acetaminophen (TYLENOL) 325 MG tablet Take 1-2 tablets (325-650 mg total) by mouth every 6 (six) hours as needed for mild pain or moderate pain.    [name] albuterol (PROVENTIL HFA;VENTOLIN HFA) 108 (90 Base) MCG/ACT inhaler Inhale 2 puffs into the lungs every 4 (four) hours as needed for wheezing or shortness of breath. 18 g 2  . aspirin 325 MG tablet Take 325 mg by mouth 2 (two) times daily.    [name] atenolol (TENORMIN) 100 MG tablet TAKE 1 TABLET (100 MG) BY MOUTH EVERY DAY 90 tablet 1  . b complex vitamins tablet Take 1 tablet daily by mouth. 100 tablet 3  . benzonatate (TESSALON) 200 MG capsule One every  8 hours as needed 45 capsule 2  . Cholecalciferol (VITAMIN D) 2000 units tablet Take 2,000 Units by mouth daily.    . diazepam (VALIUM) 5 MG tablet Take 5 mg by mouth as needed for anxiety or muscle spasms.   2  . diltiazem (CARDIZEM CD) 180 MG 24 hr capsule Take 1 capsule (180 mg total) by mouth daily. 90 capsule 1  . gabapentin (NEURONTIN) 300 MG capsule Take 600 capsules by mouth 3 (three) times daily.     . Multiple Vitamin (MULTIVITAMIN WITH MINERALS) TABS tablet Take 1 tablet by mouth daily.    [name] OVER THE COUNTER MEDICATION Place 1 application into both eyes at bedtime as needed (dry eyes). Over the counter lubricant eye gel    . oxymorphone (OPANA) 10 MG tablet Take 5 mg by mouth as needed for pain. Reported on 09/25/2015    . pantoprazole (PROTONIX) 40 MG tablet Take 1 tablet (40 mg total) by mouth 2 (two) times daily before a meal. 180 tablet 3  . PARoxetine (PAXIL) 40 MG tablet Take 1 tablet (40 mg total) by mouth 2 (two) times daily. 180 tablet 3  . polyethylene  glycol (MIRALAX / GLYCOLAX) packet Take 17 g by mouth 2 (two) times daily. (Patient taking differently: Take 17 g by mouth daily as needed (constipation). Mix in 8 oz liquid and drink) 14 each 0  . potassium chloride (KLOR-CON 10) 10 MEQ tablet Take 1 tablet (10 mEq total) by mouth daily. 30 tablet 3  . rosuvastatin (CRESTOR) 5 MG tablet Take 1 tablet (5 mg total) by mouth daily. 90 tablet 3  . SUMAtriptan (IMITREX) 100 MG tablet Take 1 tablet (100 mg total) by mouth daily as needed for migraine. Take 1 tablet (100 mg) by mouth as needed for migraine. May repeat in 2 hours if headache persists or recurs. 12 tablet 5  . traZODone (DESYREL) 100 MG tablet Take 1 tablet (100 mg total) by mouth at bedtime. 90 tablet 0   No facility-administered medications prior to visit.     ROS: Review of Systems  Constitutional: Positive for fatigue. Negative for activity change, appetite change, chills and unexpected weight change.  HENT: Negative for congestion, mouth sores and sinus pressure.   Eyes: Negative for visual disturbance.  Respiratory: Negative for cough and chest tightness.   Gastrointestinal: Negative for abdominal pain and nausea.  Genitourinary: Negative for difficulty urinating, frequency and vaginal pain.  Musculoskeletal: Positive for arthralgias and back pain. Negative for gait problem.  Skin: Negative for pallor and rash.  Neurological: Negative for dizziness, tremors, weakness, numbness and headaches.  Psychiatric/Behavioral: Positive for decreased concentration and sleep disturbance. Negative for confusion and suicidal ideas. The patient is nervous/anxious.     Objective:  BP 124/76 (BP Location: Left Arm, Patient Position: Sitting, Cuff Size: Normal)   Pulse 65   Temp 98.5 F (36.9 C) (Oral)   Ht 5\' 4"  (1.626 m)   Wt 142 lb (64.4 kg)   SpO2 90%   BMI 24.37 kg/m   BP Readings from Last 3 Encounters:  01/31/18 124/76  01/04/18 122/82  11/23/17 138/74    Wt Readings from  Last 3 Encounters:  01/31/18 142 lb (64.4 kg)  01/04/18 136 lb 12.8 oz (62.1 kg)  11/23/17 135 lb (61.2 kg)    Physical Exam  Constitutional: She appears well-developed. No distress.  HENT:  Head: Normocephalic.  Right Ear: External ear normal.  Left Ear: External ear normal.  Nose: Nose normal.  Mouth/Throat: Oropharynx is clear and moist.  Eyes: Pupils are equal, round, and reactive to light. Conjunctivae are normal. Right eye exhibits no discharge. Left eye exhibits no discharge.  Neck: Normal range of motion. Neck supple. No JVD present. No tracheal deviation present. No thyromegaly present.  Cardiovascular: Normal rate, regular rhythm and normal heart sounds.  Pulmonary/Chest: No stridor. No respiratory distress. She has no wheezes.  Abdominal: Soft. Bowel sounds are normal. She exhibits no distension and no mass. There is no tenderness. There is no rebound and no guarding.  Musculoskeletal: She exhibits tenderness. She exhibits no edema.  Lymphadenopathy:    She has no cervical adenopathy.  Neurological: She displays normal reflexes. No cranial nerve deficit. She exhibits normal muscle tone. Coordination abnormal.  Skin: No rash noted. No erythema.  Psychiatric: Her behavior is normal. Judgment and thought content normal.  ataxic  Lab Results  Component Value Date   WBC 5.1 05/14/2017   HGB 12.4 05/14/2017   HCT 37.2 05/14/2017   PLT 275.0 05/14/2017   GLUCOSE 68 11/23/2017   CHOL 262 (H) 09/26/2015   TRIG 71.0 09/26/2015   HDL 61.60 09/26/2015   LDLCALC 186 (H) 09/26/2015   ALT 6 11/23/2017   AST 18 11/23/2017   NA 142 11/23/2017   K 5.0 11/23/2017   CL 98 11/23/2017   CREATININE 0.92 11/23/2017   BUN 20 11/23/2017   CO2 29 11/23/2017   TSH 3.570 11/23/2017   INR 0.98 07/04/2015   HGBA1C 5.9 (H) 11/23/2017    Mr Brain W VV Contrast  Result Date: 12/30/2017 [facility] NEURO[facility]AG[facility] REPORT STUDY DATE: 12/28/17 PATIENT NAME: Bianca Haley  DOB: May 18, 1950 MRN: [id number] ORDERING CLINICIAN: Star Age, MD PhD CLINICAL HISTORY: 67 year old female with memory loss. EXAM: MRI brain (with and without) TECHNIQUE: MRI of the brain with and without contrast was obtained utilizing 5 mm axial slices with T1, T2, T2 flair, SWI and diffusion weighted views.  T1 sagittal, T2 coronal and postcontrast views in the axial and coronal plane were obtained. CONTRAST: 26ml multihance COMPARISON: 09/27/13 IMAGING SITE: [facility] [address] (1.5 Tesla MRI)  FINDINGS: No abnormal lesions are seen on diffusion-weighted views to suggest acute ischemia. The cortical sulci, fissures and cisterns are notable for mild perisylvian atrophy. Lateral, third and fourth ventricle are normal in size and appearance. No extra-axial fluid collections are seen. No evidence of mass effect or midline shift.  Mild periventricular and subcortical chronic small vessel ischemic disease. No abnormal lesions are seen on post contrast views.  On sagittal views the posterior fossa, pituitary gland and corpus callosum are unremarkable. No evidence of intracranial hemorrhage on SWI views. The orbits and their contents, paranasal sinuses and calvarium are notable for bilateral lens extractions.  Intracranial flow voids are present.   MRI brain (with and without) demonstrating: - Mild perisylvian atrophy. - Mild chronic small vessel ischemic disease. INTERPRETING PHYSICIAN: Penni Bombard, MD Certified in Neurology, Neurophysiology and Neuroimaging [facility] [address] [place], [place] [address] [phone]    Assessment & Plan:   There are no diagnoses linked to this encounter.   No orders of the defined types were placed in this encounter.    Follow-up: No follow-ups on file.  [name], MD

## 2018-01-31 NOTE — Assessment & Plan Note (Signed)
Multifactorial  Try to cut back on pain killers very slowly Neurology referral offered

## 2018-01-31 NOTE — Patient Instructions (Signed)
MC well w/Jill 

## 2018-01-31 NOTE — Assessment & Plan Note (Signed)
Labs

## 2018-01-31 NOTE — Assessment & Plan Note (Signed)
Worse Paxil Trazodone - we can try to increase the dose Risks

## 2018-01-31 NOTE — Assessment & Plan Note (Signed)
Atenolol, Diltiazem Added Maxzide  

## 2018-03-14 ENCOUNTER — Other Ambulatory Visit: Payer: Self-pay | Admitting: Internal Medicine

## 2018-03-15 DIAGNOSIS — Z780 Asymptomatic menopausal state: Secondary | ICD-10-CM | POA: Diagnosis not present

## 2018-03-15 DIAGNOSIS — Z130 Encounter for screening for diseases of the blood and blood-forming organs and certain disorders involving the immune mechanism: Secondary | ICD-10-CM | POA: Diagnosis not present

## 2018-03-15 DIAGNOSIS — Z1231 Encounter for screening mammogram for malignant neoplasm of breast: Secondary | ICD-10-CM | POA: Diagnosis not present

## 2018-03-15 DIAGNOSIS — Z6822 Body mass index (BMI) 22.0-22.9, adult: Secondary | ICD-10-CM | POA: Diagnosis not present

## 2018-03-15 DIAGNOSIS — Z1389 Encounter for screening for other disorder: Secondary | ICD-10-CM | POA: Diagnosis not present

## 2018-03-15 DIAGNOSIS — Z01419 Encounter for gynecological examination (general) (routine) without abnormal findings: Secondary | ICD-10-CM | POA: Diagnosis not present

## 2018-03-17 ENCOUNTER — Telehealth: Payer: Self-pay | Admitting: Internal Medicine

## 2018-03-17 NOTE — Telephone Encounter (Signed)
Pt requests Restasis but this is not on her medication list; contacted the pt regarding this request; pt says that the original prescription was written by Dr Herbert Deaner, opthalmology; pt instructed to contact this provider to refill this medication; she verbalized understanding; the pt is normally seen by Dr Alain Marion, [facility]; will route to office for notification of this encounter.

## 2018-03-17 NOTE — Telephone Encounter (Signed)
Copied from Rew [id number]. Topic: Quick Communication - Rx Refill/Question >> Mar 17, 2018 10:12 AM Selinda Flavin B, NT wrote: **Patient states that she just moved and has misplaced the medications. Would like a refill sent to the pharmacy. Please advise.**  Medication: Restasis 0.05%  (single use eye drops for dry eye)  Has the patient contacted their pharmacy? Yes.   (Agent: If no, request that the patient contact the pharmacy for the refill.) (Agent: If yes, when and what did the pharmacy advise?)  Preferred Pharmacy (with phone number or street name): [id number] - [place], [place]: Please be advised that RX refills may take up to 3 business days. We ask that you follow-up with your pharmacy.

## 2018-03-21 DIAGNOSIS — I10 Essential (primary) hypertension: Secondary | ICD-10-CM | POA: Diagnosis not present

## 2018-03-21 DIAGNOSIS — M5412 Radiculopathy, cervical region: Secondary | ICD-10-CM | POA: Diagnosis not present

## 2018-03-21 DIAGNOSIS — M5441 Lumbago with sciatica, right side: Secondary | ICD-10-CM | POA: Diagnosis not present

## 2018-03-21 DIAGNOSIS — M542 Cervicalgia: Secondary | ICD-10-CM | POA: Diagnosis not present

## 2018-03-21 DIAGNOSIS — M5442 Lumbago with sciatica, left side: Secondary | ICD-10-CM | POA: Diagnosis not present

## 2018-03-21 DIAGNOSIS — G8929 Other chronic pain: Secondary | ICD-10-CM | POA: Diagnosis not present

## 2018-04-29 DIAGNOSIS — Z0279 Encounter for issue of other medical certificate: Secondary | ICD-10-CM

## 2018-05-01 ENCOUNTER — Other Ambulatory Visit: Payer: Self-pay | Admitting: Internal Medicine

## 2018-05-01 DIAGNOSIS — Z022 Encounter for examination for admission to residential institution: Secondary | ICD-10-CM

## 2018-05-05 ENCOUNTER — Ambulatory Visit: Payer: Medicare Other | Admitting: Internal Medicine

## 2018-05-05 DIAGNOSIS — Z0289 Encounter for other administrative examinations: Secondary | ICD-10-CM

## 2018-05-11 ENCOUNTER — Other Ambulatory Visit: Payer: Medicare Other

## 2018-05-11 DIAGNOSIS — Z022 Encounter for examination for admission to residential institution: Secondary | ICD-10-CM

## 2018-05-13 LAB — QUANTIFERON-TB GOLD PLUS
MITOGEN-NIL: 8.57 [IU]/mL
NIL: 0.02 [IU]/mL
QuantiFERON-TB Gold Plus: NEGATIVE
TB1-NIL: 0 IU/mL
TB2-NIL: 0 [IU]/mL

## 2018-05-24 DIAGNOSIS — R2681 Unsteadiness on feet: Secondary | ICD-10-CM | POA: Diagnosis not present

## 2018-05-24 DIAGNOSIS — R2689 Other abnormalities of gait and mobility: Secondary | ICD-10-CM | POA: Diagnosis not present

## 2018-05-24 DIAGNOSIS — M545 Low back pain: Secondary | ICD-10-CM | POA: Diagnosis not present

## 2018-05-24 DIAGNOSIS — M6281 Muscle weakness (generalized): Secondary | ICD-10-CM | POA: Diagnosis not present

## 2018-05-26 DIAGNOSIS — R2689 Other abnormalities of gait and mobility: Secondary | ICD-10-CM | POA: Diagnosis not present

## 2018-05-26 DIAGNOSIS — M6281 Muscle weakness (generalized): Secondary | ICD-10-CM | POA: Diagnosis not present

## 2018-05-26 DIAGNOSIS — M545 Low back pain: Secondary | ICD-10-CM | POA: Diagnosis not present

## 2018-05-26 DIAGNOSIS — R2681 Unsteadiness on feet: Secondary | ICD-10-CM | POA: Diagnosis not present

## 2018-05-26 DIAGNOSIS — H16223 Keratoconjunctivitis sicca, not specified as Sjogren's, bilateral: Secondary | ICD-10-CM | POA: Diagnosis not present

## 2018-05-26 DIAGNOSIS — H04123 Dry eye syndrome of bilateral lacrimal glands: Secondary | ICD-10-CM | POA: Diagnosis not present

## 2018-05-27 ENCOUNTER — Ambulatory Visit: Payer: Medicare Other | Admitting: Nurse Practitioner

## 2018-05-27 ENCOUNTER — Telehealth: Payer: Self-pay | Admitting: Neurology

## 2018-05-27 NOTE — Telephone Encounter (Signed)
Pt was told that re: the appointment with Kandis Nab, PsyD.  That provider is no longer with the practice and she needs a referral to someone else

## 2018-05-30 ENCOUNTER — Encounter: Payer: Medicare Other | Admitting: Psychology

## 2018-05-30 DIAGNOSIS — M545 Low back pain: Secondary | ICD-10-CM | POA: Diagnosis not present

## 2018-05-30 DIAGNOSIS — R2681 Unsteadiness on feet: Secondary | ICD-10-CM | POA: Diagnosis not present

## 2018-05-30 DIAGNOSIS — M6281 Muscle weakness (generalized): Secondary | ICD-10-CM | POA: Diagnosis not present

## 2018-05-30 DIAGNOSIS — R2689 Other abnormalities of gait and mobility: Secondary | ICD-10-CM | POA: Diagnosis not present

## 2018-05-31 NOTE — Telephone Encounter (Signed)
Follow up to make sure Dr. Ferne Coe office received.

## 2018-06-01 DIAGNOSIS — R2689 Other abnormalities of gait and mobility: Secondary | ICD-10-CM | POA: Diagnosis not present

## 2018-06-01 DIAGNOSIS — R2681 Unsteadiness on feet: Secondary | ICD-10-CM | POA: Diagnosis not present

## 2018-06-01 DIAGNOSIS — M6281 Muscle weakness (generalized): Secondary | ICD-10-CM | POA: Diagnosis not present

## 2018-06-01 DIAGNOSIS — M545 Low back pain: Secondary | ICD-10-CM | POA: Diagnosis not present

## 2018-06-03 DIAGNOSIS — R2689 Other abnormalities of gait and mobility: Secondary | ICD-10-CM | POA: Diagnosis not present

## 2018-06-03 DIAGNOSIS — M6281 Muscle weakness (generalized): Secondary | ICD-10-CM | POA: Diagnosis not present

## 2018-06-03 DIAGNOSIS — M545 Low back pain: Secondary | ICD-10-CM | POA: Diagnosis not present

## 2018-06-03 DIAGNOSIS — R2681 Unsteadiness on feet: Secondary | ICD-10-CM | POA: Diagnosis not present

## 2018-06-03 NOTE — Telephone Encounter (Signed)
I called their office to follow up. They stated that the referral coordinator was out of the office but they took a message to have her call me back on Monday and let me know.

## 2018-06-06 DIAGNOSIS — M545 Low back pain: Secondary | ICD-10-CM | POA: Diagnosis not present

## 2018-06-06 DIAGNOSIS — R2681 Unsteadiness on feet: Secondary | ICD-10-CM | POA: Diagnosis not present

## 2018-06-06 DIAGNOSIS — R2689 Other abnormalities of gait and mobility: Secondary | ICD-10-CM | POA: Diagnosis not present

## 2018-06-06 DIAGNOSIS — M6281 Muscle weakness (generalized): Secondary | ICD-10-CM | POA: Diagnosis not present

## 2018-06-08 DIAGNOSIS — R2681 Unsteadiness on feet: Secondary | ICD-10-CM | POA: Diagnosis not present

## 2018-06-08 DIAGNOSIS — R2689 Other abnormalities of gait and mobility: Secondary | ICD-10-CM | POA: Diagnosis not present

## 2018-06-08 DIAGNOSIS — M545 Low back pain: Secondary | ICD-10-CM | POA: Diagnosis not present

## 2018-06-08 DIAGNOSIS — M6281 Muscle weakness (generalized): Secondary | ICD-10-CM | POA: Diagnosis not present

## 2018-06-09 ENCOUNTER — Other Ambulatory Visit: Payer: Self-pay | Admitting: Internal Medicine

## 2018-06-13 DIAGNOSIS — R2681 Unsteadiness on feet: Secondary | ICD-10-CM | POA: Diagnosis not present

## 2018-06-13 DIAGNOSIS — M545 Low back pain: Secondary | ICD-10-CM | POA: Diagnosis not present

## 2018-06-13 DIAGNOSIS — M6281 Muscle weakness (generalized): Secondary | ICD-10-CM | POA: Diagnosis not present

## 2018-06-13 DIAGNOSIS — R2689 Other abnormalities of gait and mobility: Secondary | ICD-10-CM | POA: Diagnosis not present

## 2018-06-14 DIAGNOSIS — M545 Low back pain: Secondary | ICD-10-CM | POA: Diagnosis not present

## 2018-06-14 DIAGNOSIS — M6281 Muscle weakness (generalized): Secondary | ICD-10-CM | POA: Diagnosis not present

## 2018-06-14 DIAGNOSIS — R2689 Other abnormalities of gait and mobility: Secondary | ICD-10-CM | POA: Diagnosis not present

## 2018-06-14 DIAGNOSIS — R2681 Unsteadiness on feet: Secondary | ICD-10-CM | POA: Diagnosis not present

## 2018-06-16 DIAGNOSIS — M6281 Muscle weakness (generalized): Secondary | ICD-10-CM | POA: Diagnosis not present

## 2018-06-16 DIAGNOSIS — R2681 Unsteadiness on feet: Secondary | ICD-10-CM | POA: Diagnosis not present

## 2018-06-16 DIAGNOSIS — M545 Low back pain: Secondary | ICD-10-CM | POA: Diagnosis not present

## 2018-06-16 DIAGNOSIS — R2689 Other abnormalities of gait and mobility: Secondary | ICD-10-CM | POA: Diagnosis not present

## 2018-06-20 ENCOUNTER — Encounter: Payer: Medicare Other | Admitting: Psychology

## 2018-06-20 DIAGNOSIS — M6281 Muscle weakness (generalized): Secondary | ICD-10-CM | POA: Diagnosis not present

## 2018-06-20 DIAGNOSIS — M545 Low back pain: Secondary | ICD-10-CM | POA: Diagnosis not present

## 2018-06-20 DIAGNOSIS — R2689 Other abnormalities of gait and mobility: Secondary | ICD-10-CM | POA: Diagnosis not present

## 2018-06-20 DIAGNOSIS — R2681 Unsteadiness on feet: Secondary | ICD-10-CM | POA: Diagnosis not present

## 2018-06-21 DIAGNOSIS — R2689 Other abnormalities of gait and mobility: Secondary | ICD-10-CM | POA: Diagnosis not present

## 2018-06-21 DIAGNOSIS — R2681 Unsteadiness on feet: Secondary | ICD-10-CM | POA: Diagnosis not present

## 2018-06-21 DIAGNOSIS — M6281 Muscle weakness (generalized): Secondary | ICD-10-CM | POA: Diagnosis not present

## 2018-06-21 DIAGNOSIS — M542 Cervicalgia: Secondary | ICD-10-CM | POA: Diagnosis not present

## 2018-06-21 DIAGNOSIS — M5441 Lumbago with sciatica, right side: Secondary | ICD-10-CM | POA: Diagnosis not present

## 2018-06-21 DIAGNOSIS — M25561 Pain in right knee: Secondary | ICD-10-CM | POA: Diagnosis not present

## 2018-06-21 DIAGNOSIS — I10 Essential (primary) hypertension: Secondary | ICD-10-CM | POA: Diagnosis not present

## 2018-06-21 DIAGNOSIS — M545 Low back pain: Secondary | ICD-10-CM | POA: Diagnosis not present

## 2018-06-23 ENCOUNTER — Ambulatory Visit (INDEPENDENT_AMBULATORY_CARE_PROVIDER_SITE_OTHER): Payer: Medicare Other | Admitting: Internal Medicine

## 2018-06-23 ENCOUNTER — Other Ambulatory Visit (INDEPENDENT_AMBULATORY_CARE_PROVIDER_SITE_OTHER): Payer: Medicare Other

## 2018-06-23 ENCOUNTER — Encounter: Payer: Self-pay | Admitting: Internal Medicine

## 2018-06-23 VITALS — BP 128/82 | HR 61 | Temp 97.8°F | Ht 64.0 in | Wt 140.0 lb

## 2018-06-23 DIAGNOSIS — N183 Chronic kidney disease, stage 3 unspecified: Secondary | ICD-10-CM

## 2018-06-23 DIAGNOSIS — G8929 Other chronic pain: Secondary | ICD-10-CM

## 2018-06-23 DIAGNOSIS — R413 Other amnesia: Secondary | ICD-10-CM

## 2018-06-23 DIAGNOSIS — E876 Hypokalemia: Secondary | ICD-10-CM | POA: Diagnosis not present

## 2018-06-23 DIAGNOSIS — F1120 Opioid dependence, uncomplicated: Secondary | ICD-10-CM

## 2018-06-23 DIAGNOSIS — Z23 Encounter for immunization: Secondary | ICD-10-CM | POA: Diagnosis not present

## 2018-06-23 DIAGNOSIS — K219 Gastro-esophageal reflux disease without esophagitis: Secondary | ICD-10-CM

## 2018-06-23 DIAGNOSIS — M542 Cervicalgia: Secondary | ICD-10-CM

## 2018-06-23 DIAGNOSIS — R739 Hyperglycemia, unspecified: Secondary | ICD-10-CM

## 2018-06-23 DIAGNOSIS — E785 Hyperlipidemia, unspecified: Secondary | ICD-10-CM

## 2018-06-23 DIAGNOSIS — I10 Essential (primary) hypertension: Secondary | ICD-10-CM

## 2018-06-23 DIAGNOSIS — M549 Dorsalgia, unspecified: Secondary | ICD-10-CM

## 2018-06-23 LAB — HEPATIC FUNCTION PANEL
ALK PHOS: 93 U/L (ref 39–117)
ALT: 9 U/L (ref 0–35)
AST: 19 U/L (ref 0–37)
Albumin: 4.4 g/dL (ref 3.5–5.2)
Bilirubin, Direct: 0.1 mg/dL (ref 0.0–0.3)
TOTAL PROTEIN: 7.2 g/dL (ref 6.0–8.3)
Total Bilirubin: 0.4 mg/dL (ref 0.2–1.2)

## 2018-06-23 LAB — BASIC METABOLIC PANEL
BUN: 14 mg/dL (ref 6–23)
CALCIUM: 9.2 mg/dL (ref 8.4–10.5)
CO2: 34 mEq/L — ABNORMAL HIGH (ref 19–32)
Chloride: 102 mEq/L (ref 96–112)
Creatinine, Ser: 0.89 mg/dL (ref 0.40–1.20)
GFR: 62.73 mL/min (ref >60.00)
GLUCOSE: 85 mg/dL (ref 70–99)
POTASSIUM: 4.5 meq/L (ref 3.5–5.1)
Sodium: 140 mEq/L (ref 135–145)

## 2018-06-23 LAB — HEMOGLOBIN A1C: Hgb A1c MFr Bld: 6.1 % (ref 4.6–6.5)

## 2018-06-23 MED ORDER — DIAZEPAM 5 MG PO TABS: 5.0000 mg | ORAL_TABLET | ORAL | 2 refills | Status: DC | PRN

## 2018-06-23 NOTE — Assessment & Plan Note (Signed)
[facility] Clinic said they will not cont to Rx Diazepam for spasms - I was asked to rx Diazepam. The pt understands risks. Will not take together w/oxymorphone...  Potential benefits of a long term benzodiazepines  use as well as potential risks  and complications were explained to the patient and were aknowledged.

## 2018-06-23 NOTE — Patient Instructions (Signed)
If you have medicare related insurance (such as traditional Medicare, Blue Cross Medicare, United HealthCare Medicare, or similar), Please make an appointment at the scheduling desk with Jill, the Wellness Health Coach, for your Wellness visit in this office, which is a benefit with your insurance.  

## 2018-06-23 NOTE — Assessment & Plan Note (Signed)
Labs

## 2018-06-23 NOTE — Addendum Note (Signed)
Addended by: Karren Cobble on: 06/23/2018 02:24 PM   Modules accepted: Orders

## 2018-06-23 NOTE — Assessment & Plan Note (Addendum)
[facility]  [facility] said they will not cont to Rx Diazepam for spasms - I was asked to rx Diazepam. The pt understands risks. Will not take together w/oxymorphone...  Potential benefits of a long term benzodiazepines  use as well as potential risks  and complications were explained to the patient and were aknowledged.

## 2018-06-23 NOTE — Assessment & Plan Note (Signed)
Atenolol, Diltiazem Added Maxzide

## 2018-06-23 NOTE — Progress Notes (Signed)
Subjective:  Patient ID: Bianca Haley, female    DOB: December 21, 1950  Age: 68 y.o. MRN: [id number]  CC: No chief complaint on file.   HPI LESSA HUGE presents for chronic pain, HTN, depression f/u. Pain Clinic said they will not cont to Rx Diazepam - I was asked to rx Diazepam. The pt understands risks. Will not take together w/oxymorphone...  Outpatient Medications Prior to Visit  Medication Sig Dispense Refill  . acetaminophen (TYLENOL) 325 MG tablet Take 1-2 tablets (325-650 mg total) by mouth every 6 (six) hours as needed for mild pain or moderate pain.    [name] albuterol (PROVENTIL HFA;VENTOLIN HFA) 108 (90 Base) MCG/ACT inhaler Inhale 2 puffs into the lungs every 4 (four) hours as needed for wheezing or shortness of breath. 18 g 2  . aspirin 325 MG tablet Take 325 mg by mouth 2 (two) times daily.    [name] atenolol (TENORMIN) 100 MG tablet TAKE 1 TABLET(100 MG) BY MOUTH EVERY DAY 90 tablet 3  . b complex vitamins tablet Take 1 tablet daily by mouth. 100 tablet 3  . benzonatate (TESSALON) 200 MG capsule One every  8 hours as needed 45 capsule 2  . Cholecalciferol (VITAMIN D) 2000 units tablet Take 2,000 Units by mouth daily.    . diazepam (VALIUM) 5 MG tablet Take 5 mg by mouth as needed for anxiety or muscle spasms.   2  . diltiazem (CARDIZEM CD) 180 MG 24 hr capsule Take 1 capsule (180 mg total) by mouth daily. 90 capsule 1  . gabapentin (NEURONTIN) 300 MG capsule Take 600 capsules by mouth 3 (three) times daily.     . Multiple Vitamin (MULTIVITAMIN WITH MINERALS) TABS tablet Take 1 tablet by mouth daily.    [name] OVER THE COUNTER MEDICATION Place 1 application into both eyes at bedtime as needed (dry eyes). Over the counter lubricant eye gel    . oxymorphone (OPANA) 10 MG tablet Take 5 mg by mouth as needed for pain. Reported on 09/25/2015    . pantoprazole (PROTONIX) 40 MG tablet Take 1 tablet (40 mg total) by mouth 2 (two) times daily before a meal. 180 tablet 3  . PARoxetine (PAXIL) 40 MG  tablet Take 1 tablet (40 mg total) by mouth 2 (two) times daily. 180 tablet 3  . polyethylene glycol (MIRALAX / GLYCOLAX) packet Take 17 g by mouth 2 (two) times daily. (Patient taking differently: Take 17 g by mouth daily as needed (constipation). Mix in 8 oz liquid and drink) 14 each 0  . potassium chloride (KLOR-CON 10) 10 MEQ tablet Take 1 tablet (10 mEq total) by mouth daily. 30 tablet 3  . rosuvastatin (CRESTOR) 5 MG tablet Take 1 tablet (5 mg total) by mouth daily. 90 tablet 3  . SUMAtriptan (IMITREX) 100 MG tablet TAKE 1 TABLET BY MOUTH DAILY AS NEEDED FOR MIGRAINE, MAY REPEAT IN 2 HOURS IF HEADACHE PERSISTS OR RECURS 12 tablet 0  . traZODone (DESYREL) 150 MG tablet Take 1 tablet (150 mg total) by mouth at bedtime. 90 tablet 3   No facility-administered medications prior to visit.     ROS: Review of Systems  Constitutional: Positive for fatigue. Negative for activity change, appetite change, chills and unexpected weight change.  HENT: Negative for congestion, mouth sores and sinus pressure.   Eyes: Negative for visual disturbance.  Respiratory: Negative for cough and chest tightness.   Gastrointestinal: Negative for abdominal pain and nausea.  Genitourinary: Negative for difficulty urinating, frequency and vaginal  pain.  Musculoskeletal: Positive for back pain and gait problem.  Skin: Negative for pallor and rash.  Neurological: Negative for dizziness, tremors, weakness, numbness and headaches.  Psychiatric/Behavioral: Positive for dysphoric mood. Negative for confusion, sleep disturbance and suicidal ideas. The patient is nervous/anxious.     Objective:  BP 128/82 (BP Location: Left Arm, Patient Position: Sitting, Cuff Size: Normal)   Pulse 61   Temp 97.8 F (36.6 C) (Oral)   Ht 5\' 4"  (1.626 m)   Wt 140 lb (63.5 kg)   SpO2 92%   BMI 24.03 kg/m   BP Readings from Last 3 Encounters:  06/23/18 128/82  01/31/18 124/76  01/04/18 122/82    Wt Readings from Last 3  Encounters:  06/23/18 140 lb (63.5 kg)  01/31/18 142 lb (64.4 kg)  01/04/18 136 lb 12.8 oz (62.1 kg)    Physical Exam Constitutional:      General: She is not in acute distress.    Appearance: She is well-developed.  HENT:     Head: Normocephalic.     Right Ear: External ear normal.     Left Ear: External ear normal.     Nose: Nose normal.  Eyes:     General:        Right eye: No discharge.        Left eye: No discharge.     Conjunctiva/sclera: Conjunctivae normal.     Pupils: Pupils are equal, round, and reactive to light.  Neck:     Musculoskeletal: Normal range of motion and neck supple.     Thyroid: No thyromegaly.     Vascular: No JVD.     Trachea: No tracheal deviation.  Cardiovascular:     Rate and Rhythm: Normal rate and regular rhythm.     Heart sounds: Normal heart sounds.  Pulmonary:     Effort: No respiratory distress.     Breath sounds: No stridor. No wheezing.  Abdominal:     General: Bowel sounds are normal. There is no distension.     Palpations: Abdomen is soft. There is no mass.     Tenderness: There is no abdominal tenderness. There is no guarding or rebound.  Musculoskeletal:        General: Tenderness present.  Lymphadenopathy:     Cervical: No cervical adenopathy.  Skin:    Findings: No erythema or rash.  Neurological:     Cranial Nerves: No cranial nerve deficit.     Motor: No abnormal muscle tone.     Coordination: Coordination normal.     Deep Tendon Reflexes: Reflexes normal.  Psychiatric:        Behavior: Behavior normal.        Thought Content: Thought content normal.        Judgment: Judgment normal.   LS w/pain A/o/c  Lab Results  Component Value Date   WBC 5.1 05/14/2017   HGB 12.4 05/14/2017   HCT 37.2 05/14/2017   PLT 275.0 05/14/2017   GLUCOSE 68 11/23/2017   CHOL 262 (H) 09/26/2015   TRIG 71.0 09/26/2015   HDL 61.60 09/26/2015   LDLCALC 186 (H) 09/26/2015   ALT 6 11/23/2017   AST 18 11/23/2017   NA 142 11/23/2017     K 5.0 11/23/2017   CL 98 11/23/2017   CREATININE 0.92 11/23/2017   BUN 20 11/23/2017   CO2 29 11/23/2017   TSH 3.570 11/23/2017   INR 0.98 07/04/2015   HGBA1C 5.9 (H) 11/23/2017    Mr Brain W  Wo Contrast  Result Date: 12/30/2017 [facility] NEUROIM[facility] REPORT STUDY DATE: 12/28/17 PATIENT NAME: ARYANA WONNACOTT DOB: March 02, 1951 MRN: [id number] ORDERING CLINICIAN: Star Age, MD PhD CLINICAL HISTORY: 68 year old female with memory loss. EXAM: MRI brain (with and without) TECHNIQUE: MRI of the brain with and without contrast was obtained utilizing 5 mm axial slices with T1, T2, T2 flair, SWI and diffusion weighted views.  T1 sagittal, T2 coronal and postcontrast views in the axial and coronal plane were obtained. CONTRAST: 22ml multihance COMPARISON: 09/27/13 IMAGING SITE: [facility] [address] (1.5 Tesla MRI)  FINDINGS: No abnormal lesions are seen on diffusion-weighted views to suggest acute ischemia. The cortical sulci, fissures and cisterns are notable for mild perisylvian atrophy. Lateral, third and fourth ventricle are normal in size and appearance. No extra-axial fluid collections are seen. No evidence of mass effect or midline shift.  Mild periventricular and subcortical chronic small vessel ischemic disease. No abnormal lesions are seen on post contrast views.  On sagittal views the posterior fossa, pituitary gland and corpus callosum are unremarkable. No evidence of intracranial hemorrhage on SWI views. The orbits and their contents, paranasal sinuses and calvarium are notable for bilateral lens extractions.  Intracranial flow voids are present.   MRI brain (with and without) demonstrating: - Mild perisylvian atrophy. - Mild chronic small vessel ischemic disease. INTERPRETING PHYSICIAN: Penni Bombard, MD Certified in Neurology, Neurophysiology and Neuroimaging [facility] [address] [place], [place] [address] [phone]     Assessment & Plan:   There are no diagnoses linked to this encounter.   No orders of the defined types were placed in this encounter.    Follow-up: No follow-ups on file.  [name], MD

## 2018-06-23 NOTE — Assessment & Plan Note (Signed)
-   Crestor 

## 2018-06-23 NOTE — Assessment & Plan Note (Signed)
Bed head elevation and Protonix bid

## 2018-06-23 NOTE — Assessment & Plan Note (Signed)
Better  

## 2018-06-24 DIAGNOSIS — R2689 Other abnormalities of gait and mobility: Secondary | ICD-10-CM | POA: Diagnosis not present

## 2018-06-24 DIAGNOSIS — R2681 Unsteadiness on feet: Secondary | ICD-10-CM | POA: Diagnosis not present

## 2018-06-24 DIAGNOSIS — M545 Low back pain: Secondary | ICD-10-CM | POA: Diagnosis not present

## 2018-06-24 DIAGNOSIS — M6281 Muscle weakness (generalized): Secondary | ICD-10-CM | POA: Diagnosis not present

## 2018-06-27 DIAGNOSIS — R2681 Unsteadiness on feet: Secondary | ICD-10-CM | POA: Diagnosis not present

## 2018-06-27 DIAGNOSIS — M545 Low back pain: Secondary | ICD-10-CM | POA: Diagnosis not present

## 2018-06-27 DIAGNOSIS — M6281 Muscle weakness (generalized): Secondary | ICD-10-CM | POA: Diagnosis not present

## 2018-06-27 DIAGNOSIS — R2689 Other abnormalities of gait and mobility: Secondary | ICD-10-CM | POA: Diagnosis not present

## 2018-06-27 NOTE — Telephone Encounter (Signed)
I called pt to discuss if her neuro psych testing has been completed. No answer, left a message asking her to call me back.

## 2018-06-28 ENCOUNTER — Ambulatory Visit (INDEPENDENT_AMBULATORY_CARE_PROVIDER_SITE_OTHER): Payer: Medicare Other | Admitting: Neurology

## 2018-06-28 ENCOUNTER — Encounter: Payer: Self-pay | Admitting: Neurology

## 2018-06-28 VITALS — BP 184/84 | HR 57 | Ht 65.5 in | Wt 140.0 lb

## 2018-06-28 DIAGNOSIS — R419 Unspecified symptoms and signs involving cognitive functions and awareness: Secondary | ICD-10-CM

## 2018-06-28 DIAGNOSIS — R413 Other amnesia: Secondary | ICD-10-CM

## 2018-06-28 NOTE — Patient Instructions (Signed)
Your memory is stable.  You can follow up in 6 months routinely.  We will follow through with the neuropsychological test (aka cognitive testing) for your memory complaints. This requires a referral to a trained and licensed neuropsychologist and will be a separate appointment at a different clinic.

## 2018-06-28 NOTE — Telephone Encounter (Signed)
I called to confirm they received the referral. Patient can call to schedule [phone]. I called to make the patient aware and gave her the number.

## 2018-06-28 NOTE — Telephone Encounter (Signed)
Pt presented to her appt today. She has not been contacted by Dr. Ferne Coe office for her neuro psych testing. Please resend referral to that office and let us know if a new referral is needed.

## 2018-06-28 NOTE — Progress Notes (Signed)
Subjective:    Patient ID: Bianca Haley is a 68 y.o. female.  HPI     Interim history:   Ms. Bianca Haley is a 68-year-old right-handed woman with an underlying medical history of hypertension, history of migraines, reflux disease, low back pain, s/p surgery, on chronic narcotic pain medications, arthritis, s/p multiple LLE orthopedic surgeries, anxiety, depression and vitamin D deficiency, who presents for follow-up consultation of her memory loss. The patient is unaccompanied today. I first met her on 11/23/2017 at the request of her primary care physician, at which time she reported a several month history or 2 year history of short-term memory issues. Her MMSE was 25 at the time. I suggested workup in the form of blood tests, neuropsychological evaluation and brain scan. Her blood test showed normal RPR, TSH, B12, folate, CMP, A1c was in the prediabetes range at 5.9. She had a brain MRI with and without contrast on 12/28/2017 and I reviewed the results: IMPRESSION:    MRI brain (with and without) demonstrating: - Mild perisylvian atrophy. - Mild chronic small vessel ischemic disease.   We called her with her test results.  Today, 04/28/2019: She reports feeling stable. No recent acute issues. Still on pain meds, through pain management in [place]. Has been driving. She did not have an appointment with neuropsychology. She still takes the trazodone for sleep. Takes gabapentin at night only, 900 mg. Depression and anxiety stable now, but flared up with the recent move. She moved in November to [place], in the main building, independent living, 2 bedrooms, with long-term BF.   The patient's allergies, current medications, family history, past medical history, past social history, past surgical history and problem list were reviewed and updated as appropriate.   Previously:   11/23/2017: (She) reports short term memory loss for the past few months and maybe a couple of years. Her daughter  has voiced concern. Patient reports difficulty remembering names all her life. Mom had dementia in the context of [facility].  I reviewed your office note from 05/14/2017. She had a brain MRI with and without contrast on 09/27/2013 and I reviewed the results: IMPRESSION: 1. No focal lesion along the planum sphenoidale. The abnormality on CT is likely partial voluming. 2. Normal MRI appearance of the brain for age. 3. Mild diffuse sinus disease as described. The left maxillary sinus is completely opacified.   She has a Hx of anxiety. She lost her first husband at age 47 from prostate cancer. She has one daughter and one son from her first husband, ages 38 and 32, daughter lives in [place] and has 2 kids, son lives in [place] and has no children. Sister lives in [place]. She lives with her long-term boyfriend whom I have seen before as well. She is a nonsmoker and does not drink alcohol, drinks caffeine in limitation, 1-2 cups in the mornings typically. She follows with pain management through her neurosurgeon's office. She is using her pain medication cautiously and only if needed. She is retired. She has a long-standing history of anxiety, dating back to even childhood as she recalls. She does not have a history of ADD or ADHD as far she knows. She had left foot surgery in March last year and needed subsequent surgeries due to infection and need for hardware removal and bone graft was placed eventually.  Her Past Medical History Is Significant For: Past Medical History:  Diagnosis Date  . Anemia   . Anxiety   . Arthritis   . Back   pain   . Chronic renal failure    patient denies   . GERD (gastroesophageal reflux disease)   . H/O tinnitus    left  . Headache(784.0)   . History of endometriosis   . History of uterine fibroid   . History of UTI   . Hypertension   . Migraine   . Recurrent pneumonia     Her Past Surgical History Is Significant For: Past Surgical History:  Procedure  Laterality Date  . ABDOMINAL HYSTERECTOMY    . BACK SURGERY    . BLADDER SUSPENSION    . COLONOSCOPY  02/16/2008   normal  . laparoscopy     for evauation of endometriosis  . TOTAL KNEE ARTHROPLASTY Left 07/15/2015   Procedure: TOTAL LEFT KNEE ARTHROPLASTY;  Surgeon: Matthew Olin, MD;  Location: [facility];  Service: Orthopedics;  Laterality: Left;    Her Family History Is Significant For: Family History  Problem Relation Age of Onset  . Emphysema Mother   . Heart disease Mother 78       MI    Her Social History Is Significant For: Social History   Socioeconomic History  . Marital status: Widowed    Spouse name: Not on file  . Number of children: 2  . Years of education: 16  . Highest education level: Not on file  Occupational History  . Occupation: interior designer  Social Needs  . Financial resource strain: Not on file  . Food insecurity:    Worry: Not on file    Inability: Not on file  . Transportation needs:    Medical: Not on file    Non-medical: Not on file  Tobacco Use  . Smoking status: Never Smoker  . Smokeless tobacco: Never Used  Substance and Sexual Activity  . Alcohol use: No    Alcohol/week: 0.0 standard drinks    Comment: no drinking for sevral months  . Drug use: No  . Sexual activity: Yes    Partners: Male  Lifestyle  . Physical activity:    Days per week: Not on file    Minutes per session: Not on file  . Stress: Not on file  Relationships  . Social connections:    Talks on phone: Not on file    Gets together: Not on file    Attends religious service: Not on file    Active member of club or organization: Not on file    Attends meetings of clubs or organizations: Not on file    Relationship status: Not on file  Other Topics Concern  . Not on file  Social History Narrative   HSG, [employer] BS interior design. Married '79 - 20 years/divorced. Now engaged. 1 dtr - '84 - lawyer, 1 son '89. No h/o abuse.    Her Allergies Are:  Allergies   Allergen Reactions  . Penicillins Shortness Of Breath and Rash    ..Has patient had a PCN reaction causing immediate rash, facial/tongue/throat swelling, SOB or lightheadedness with hypotension: No Has patient had a PCN reaction causing severe rash involving mucus membranes or skin necrosis: No Has patient had a PCN reaction that required hospitalization No Has patient had a PCN reaction occurring within the last 10 years: No If all of the above answers are "NO", then may proceed with Cephalosporin use.   . Morphine And Related Swelling    Sedation/Swelling described like edema/bloating/doesn't help the pain Morphine only; tolerates other opioids.   . Cefepime Rash    Reaction while   taking both cefepime and vancomycin at [facility] (after 3 days)  . Vancomycin Rash    Reaction while taking both cefepime and vancomycin at [facility] (after 3 days)  :   Her Current Medications Are:  Outpatient Encounter Medications as of 06/28/2018  Medication Sig  . acetaminophen (TYLENOL) 325 MG tablet Take 1-2 tablets (325-650 mg total) by mouth every 6 (six) hours as needed for mild pain or moderate pain.  [name] albuterol (PROVENTIL HFA;VENTOLIN HFA) 108 (90 Base) MCG/ACT inhaler Inhale 2 puffs into the lungs every 4 (four) hours as needed for wheezing or shortness of breath.  [name] aspirin 325 MG tablet Take 325 mg by mouth 2 (two) times daily.  [name] atenolol (TENORMIN) 100 MG tablet TAKE 1 TABLET(100 MG) BY MOUTH EVERY DAY  . b complex vitamins tablet Take 1 tablet daily by mouth.  . benzonatate (TESSALON) 200 MG capsule One every  8 hours as needed  . Cholecalciferol (VITAMIN D) 2000 units tablet Take 2,000 Units by mouth daily.  . diazepam (VALIUM) 5 MG tablet Take 1 tablet (5 mg total) by mouth as needed for anxiety or muscle spasms.  [name] diltiazem (CARDIZEM CD) 180 MG 24 hr capsule Take 1 capsule (180 mg total) by mouth daily.  [name] gabapentin (NEURONTIN) 300 MG capsule Take 600 capsules by mouth 3 (three) times daily.    . Multiple Vitamin (MULTIVITAMIN WITH MINERALS) TABS tablet Take 1 tablet by mouth daily.  [name] OVER THE COUNTER MEDICATION Place 1 application into both eyes at bedtime as needed (dry eyes). Over the counter lubricant eye gel  . oxymorphone (OPANA) 10 MG tablet Take 5 mg by mouth as needed for pain. Reported on 09/25/2015  . pantoprazole (PROTONIX) 40 MG tablet Take 1 tablet (40 mg total) by mouth 2 (two) times daily before a meal.  . PARoxetine (PAXIL) 40 MG tablet Take 1 tablet (40 mg total) by mouth 2 (two) times daily.  . polyethylene glycol (MIRALAX / GLYCOLAX) packet Take 17 g by mouth 2 (two) times daily. (Patient taking differently: Take 17 g by mouth daily as needed (constipation). Mix in 8 oz liquid and drink)  . potassium chloride (KLOR-CON 10) 10 MEQ tablet Take 1 tablet (10 mEq total) by mouth daily.  . rosuvastatin (CRESTOR) 5 MG tablet Take 1 tablet (5 mg total) by mouth daily.  . SUMAtriptan (IMITREX) 100 MG tablet TAKE 1 TABLET BY MOUTH DAILY AS NEEDED FOR MIGRAINE, MAY REPEAT IN 2 HOURS IF HEADACHE PERSISTS OR RECURS  . traZODone (DESYREL) 150 MG tablet Take 1 tablet (150 mg total) by mouth at bedtime.   No facility-administered encounter medications on file as of 06/28/2018.   :  Review of Systems:  Out of a complete 14 point review of systems, all are reviewed and negative with the exception of these symptoms as listed below:  Review of Systems  Neurological:       Pt presents today to discuss her memory. Pt reports that she believes that her memory is ok but her daughter is concerned that pt is forgetting things. Pt has not completed neuro psych testing and reports that she was never contacted.    Objective:  Neurological Exam  Physical Exam Physical Examination:   Vitals:   06/28/18 1256  BP: (!) 184/84  Pulse: (!) 57    General Examination: The patient is a very pleasant 68 y.o. female in no acute distress. She appears well-developed and well-nourished and well  groomed.   HEENT: Normocephalic, atraumatic, pupils  are equal, round and reactive to light and accommodation. Corrective eye glasses. Extraocular tracking is good without limitation to gaze excursion or nystagmus noted. Normal smooth pursuit is noted. Hearing is grossly intact. Face is symmetric with normal facial animation and normal facial sensation. Speech is clear with no dysarthria noted. There is no hypophonia. There is no lip, neck/head, jaw or voice tremor. Neck is supple with full range of passive and active motion. There are no carotid bruits on auscultation. Oropharynx exam reveals: moderate mouth dryness, adequate dental hygiene. Tongue protrudes centrally and palate elevates symmetrically.   Chest: Clear to auscultation without wheezing, rhonchi or crackles noted.  Heart: S1+S2+0, regular and normal without murmurs, rubs or gallops noted.   Abdomen: Soft, non-tender and non-distended with normal bowel sounds appreciated on auscultation.  Extremities: There is trace pitting edema in the L ankle only, limited mobility in the left ankle.   Skin: Warm and dry without trophic changes noted.  Musculoskeletal: exam reveals no obvious joint deformities, tenderness or joint swelling or erythema. Low back stiffness.   Neurologically:  Mental status: The patient is awake, alert and oriented except for exact date. Her immediate and remote memory, attention, language skills and fund of knowledge are fairly appropriate. There is no evidence of aphasia, agnosia, apraxia or anomia. Speech is clear with normal prosody and enunciation. Thought process is linear. Mood is normal and affect is normal.   On 11/23/2017: MMSE: 25/30, she declined serial sevens and could not fully spell the word world backwards. She was not fully oriented to exact date. CDT: 4/4, AFT: 10/min.   On 06/28/2018: MMSE: 29/30, she declined serial 7s, CDT: 3/4, AFT: 13/min.  Cranial nerves II - XII are as described above  under HEENT exam.  Motor exam: Normal bulk, strength and tone is noted. There is no tremor. Fine motor skills and coordination: grossly intact.  Cerebellar testing: No dysmetria or intention tremor. There is no truncal or gait ataxia.  Sensory exam: intact to light touch in the upper and lower extremities.  Gait, station and balance: She stands easily. No veering to one side is noted. No leaning to one side is noted. Posture is slightly stooped, mild scoliosis possibly. She walks cautiously and slowly, slight limp on the left, unchanged.  Assessment and Plan:  In summary, Edra L Ekdahl is a very pleasant 67-year old female with an underlying medical history of hypertension, history of migraines, reflux disease, back pain, on chronic narcotic pain medications, arthritis, anxiety, depression and vitamin D deficiency, who presents for follow up consultation of her memory loss. Findings are generally quite benign, her memory loss is in the mild range, could be in the realm of age-appropriate memory loss, may be exacerbated by stress/anxiety, and mild cognitive impairment could be in the differential as well. She pupils were in July 2019 and a brain MRI which showed mostly age-appropriate findings, mild perisylvian atrophy noted. She is advised to follow through with a neuropsychological evaluation, I had made a referral in that regard and we will resend referral. We again talked about maintaining a healthy lifestyle in general and staying active mentally and physically. I suggested a six-month follow-up routinely, sooner if needed. I answered all her questions today and she was in agreement.  I spent 25 minutes in total face-to-face time with the patient, more than 50% of which was spent in counseling and coordination of care, reviewing test results, reviewing medication and discussing or reviewing the diagnosis of memory loss, its prognosis   and treatment options. Pertinent laboratory and imaging test results  that were available during this visit with the patient were reviewed by me and considered in my medical decision making (see chart for details).

## 2018-06-29 DIAGNOSIS — R2681 Unsteadiness on feet: Secondary | ICD-10-CM | POA: Diagnosis not present

## 2018-06-29 DIAGNOSIS — M545 Low back pain: Secondary | ICD-10-CM | POA: Diagnosis not present

## 2018-06-29 DIAGNOSIS — M6281 Muscle weakness (generalized): Secondary | ICD-10-CM | POA: Diagnosis not present

## 2018-06-29 DIAGNOSIS — R2689 Other abnormalities of gait and mobility: Secondary | ICD-10-CM | POA: Diagnosis not present

## 2018-06-30 ENCOUNTER — Encounter: Payer: Medicare Other | Attending: Psychology | Admitting: Psychology

## 2018-06-30 DIAGNOSIS — R419 Unspecified symptoms and signs involving cognitive functions and awareness: Secondary | ICD-10-CM

## 2018-06-30 DIAGNOSIS — R2689 Other abnormalities of gait and mobility: Secondary | ICD-10-CM | POA: Diagnosis not present

## 2018-06-30 DIAGNOSIS — R413 Other amnesia: Secondary | ICD-10-CM | POA: Diagnosis not present

## 2018-06-30 DIAGNOSIS — M545 Low back pain: Secondary | ICD-10-CM | POA: Diagnosis not present

## 2018-06-30 DIAGNOSIS — R2681 Unsteadiness on feet: Secondary | ICD-10-CM | POA: Diagnosis not present

## 2018-06-30 DIAGNOSIS — M6281 Muscle weakness (generalized): Secondary | ICD-10-CM | POA: Diagnosis not present

## 2018-07-03 DIAGNOSIS — J189 Pneumonia, unspecified organism: Secondary | ICD-10-CM

## 2018-07-03 HISTORY — DX: Pneumonia, unspecified organism: J18.9

## 2018-07-04 DIAGNOSIS — M6281 Muscle weakness (generalized): Secondary | ICD-10-CM | POA: Diagnosis not present

## 2018-07-04 DIAGNOSIS — R2681 Unsteadiness on feet: Secondary | ICD-10-CM | POA: Diagnosis not present

## 2018-07-04 DIAGNOSIS — M545 Low back pain: Secondary | ICD-10-CM | POA: Diagnosis not present

## 2018-07-04 DIAGNOSIS — R2689 Other abnormalities of gait and mobility: Secondary | ICD-10-CM | POA: Diagnosis not present

## 2018-07-05 ENCOUNTER — Other Ambulatory Visit: Payer: Self-pay | Admitting: Internal Medicine

## 2018-07-06 DIAGNOSIS — R2689 Other abnormalities of gait and mobility: Secondary | ICD-10-CM | POA: Diagnosis not present

## 2018-07-06 DIAGNOSIS — R2681 Unsteadiness on feet: Secondary | ICD-10-CM | POA: Diagnosis not present

## 2018-07-06 DIAGNOSIS — M6281 Muscle weakness (generalized): Secondary | ICD-10-CM | POA: Diagnosis not present

## 2018-07-06 DIAGNOSIS — M545 Low back pain: Secondary | ICD-10-CM | POA: Diagnosis not present

## 2018-07-08 DIAGNOSIS — M6281 Muscle weakness (generalized): Secondary | ICD-10-CM | POA: Diagnosis not present

## 2018-07-08 DIAGNOSIS — M545 Low back pain: Secondary | ICD-10-CM | POA: Diagnosis not present

## 2018-07-08 DIAGNOSIS — R2689 Other abnormalities of gait and mobility: Secondary | ICD-10-CM | POA: Diagnosis not present

## 2018-07-08 DIAGNOSIS — R2681 Unsteadiness on feet: Secondary | ICD-10-CM | POA: Diagnosis not present

## 2018-07-12 ENCOUNTER — Inpatient Hospital Stay (HOSPITAL_COMMUNITY)
Admission: EM | Admit: 2018-07-12 | Discharge: 2018-07-13 | DRG: 193 | Disposition: A | Discharge status: Home / Self Care | Payer: Medicare Other | Source: Skilled Nursing Facility | Attending: Internal Medicine | Admitting: Internal Medicine

## 2018-07-12 ENCOUNTER — Emergency Department (HOSPITAL_COMMUNITY): Payer: Medicare Other

## 2018-07-12 ENCOUNTER — Encounter (HOSPITAL_COMMUNITY): Payer: Self-pay

## 2018-07-12 ENCOUNTER — Inpatient Hospital Stay (HOSPITAL_COMMUNITY): Payer: Medicare Other

## 2018-07-12 ENCOUNTER — Other Ambulatory Visit: Payer: Self-pay

## 2018-07-12 DIAGNOSIS — G9341 Metabolic encephalopathy: Secondary | ICD-10-CM | POA: Diagnosis not present

## 2018-07-12 DIAGNOSIS — K8689 Other specified diseases of pancreas: Secondary | ICD-10-CM | POA: Diagnosis present

## 2018-07-12 DIAGNOSIS — Z90710 Acquired absence of both cervix and uterus: Secondary | ICD-10-CM

## 2018-07-12 DIAGNOSIS — Z888 Allergy status to other drugs, medicaments and biological substances status: Secondary | ICD-10-CM

## 2018-07-12 DIAGNOSIS — I1310 Hypertensive heart and chronic kidney disease without heart failure, with stage 1 through stage 4 chronic kidney disease, or unspecified chronic kidney disease: Secondary | ICD-10-CM | POA: Diagnosis present

## 2018-07-12 DIAGNOSIS — G8929 Other chronic pain: Secondary | ICD-10-CM | POA: Diagnosis present

## 2018-07-12 DIAGNOSIS — Z8249 Family history of ischemic heart disease and other diseases of the circulatory system: Secondary | ICD-10-CM | POA: Diagnosis not present

## 2018-07-12 DIAGNOSIS — F329 Major depressive disorder, single episode, unspecified: Secondary | ICD-10-CM | POA: Diagnosis present

## 2018-07-12 DIAGNOSIS — N189 Chronic kidney disease, unspecified: Secondary | ICD-10-CM | POA: Diagnosis present

## 2018-07-12 DIAGNOSIS — Z96652 Presence of left artificial knee joint: Secondary | ICD-10-CM | POA: Diagnosis present

## 2018-07-12 DIAGNOSIS — R0602 Shortness of breath: Secondary | ICD-10-CM | POA: Diagnosis not present

## 2018-07-12 DIAGNOSIS — R6520 Severe sepsis without septic shock: Secondary | ICD-10-CM | POA: Diagnosis not present

## 2018-07-12 DIAGNOSIS — R509 Fever, unspecified: Secondary | ICD-10-CM | POA: Diagnosis not present

## 2018-07-12 DIAGNOSIS — Z880 Allergy status to penicillin: Secondary | ICD-10-CM | POA: Diagnosis not present

## 2018-07-12 DIAGNOSIS — K838 Other specified diseases of biliary tract: Secondary | ICD-10-CM | POA: Diagnosis present

## 2018-07-12 DIAGNOSIS — B9729 Other coronavirus as the cause of diseases classified elsewhere: Secondary | ICD-10-CM | POA: Diagnosis present

## 2018-07-12 DIAGNOSIS — N809 Endometriosis, unspecified: Secondary | ICD-10-CM | POA: Diagnosis present

## 2018-07-12 DIAGNOSIS — R05 Cough: Secondary | ICD-10-CM | POA: Diagnosis not present

## 2018-07-12 DIAGNOSIS — M549 Dorsalgia, unspecified: Secondary | ICD-10-CM | POA: Diagnosis present

## 2018-07-12 DIAGNOSIS — K219 Gastro-esophageal reflux disease without esophagitis: Secondary | ICD-10-CM | POA: Diagnosis not present

## 2018-07-12 DIAGNOSIS — Z87440 Personal history of urinary (tract) infections: Secondary | ICD-10-CM

## 2018-07-12 DIAGNOSIS — E611 Iron deficiency: Secondary | ICD-10-CM | POA: Diagnosis present

## 2018-07-12 DIAGNOSIS — R935 Abnormal findings on diagnostic imaging of other abdominal regions, including retroperitoneum: Secondary | ICD-10-CM | POA: Diagnosis not present

## 2018-07-12 DIAGNOSIS — Z881 Allergy status to other antibiotic agents status: Secondary | ICD-10-CM

## 2018-07-12 DIAGNOSIS — Z8701 Personal history of pneumonia (recurrent): Secondary | ICD-10-CM | POA: Diagnosis not present

## 2018-07-12 DIAGNOSIS — M5440 Lumbago with sciatica, unspecified side: Secondary | ICD-10-CM | POA: Diagnosis not present

## 2018-07-12 DIAGNOSIS — F419 Anxiety disorder, unspecified: Secondary | ICD-10-CM | POA: Diagnosis not present

## 2018-07-12 DIAGNOSIS — J9601 Acute respiratory failure with hypoxia: Secondary | ICD-10-CM

## 2018-07-12 DIAGNOSIS — Z79899 Other long term (current) drug therapy: Secondary | ICD-10-CM | POA: Diagnosis not present

## 2018-07-12 DIAGNOSIS — J1289 Other viral pneumonia: Secondary | ICD-10-CM | POA: Diagnosis not present

## 2018-07-12 DIAGNOSIS — J129 Viral pneumonia, unspecified: Secondary | ICD-10-CM | POA: Diagnosis not present

## 2018-07-12 DIAGNOSIS — Z885 Allergy status to narcotic agent status: Secondary | ICD-10-CM | POA: Diagnosis not present

## 2018-07-12 DIAGNOSIS — E785 Hyperlipidemia, unspecified: Secondary | ICD-10-CM | POA: Diagnosis not present

## 2018-07-12 DIAGNOSIS — K8020 Calculus of gallbladder without cholecystitis without obstruction: Secondary | ICD-10-CM

## 2018-07-12 DIAGNOSIS — R404 Transient alteration of awareness: Secondary | ICD-10-CM | POA: Diagnosis not present

## 2018-07-12 DIAGNOSIS — R231 Pallor: Secondary | ICD-10-CM | POA: Diagnosis not present

## 2018-07-12 DIAGNOSIS — I440 Atrioventricular block, first degree: Secondary | ICD-10-CM | POA: Diagnosis not present

## 2018-07-12 DIAGNOSIS — I10 Essential (primary) hypertension: Secondary | ICD-10-CM | POA: Diagnosis not present

## 2018-07-12 DIAGNOSIS — A419 Sepsis, unspecified organism: Secondary | ICD-10-CM | POA: Diagnosis not present

## 2018-07-12 DIAGNOSIS — Z825 Family history of asthma and other chronic lower respiratory diseases: Secondary | ICD-10-CM

## 2018-07-12 DIAGNOSIS — I351 Nonrheumatic aortic (valve) insufficiency: Secondary | ICD-10-CM | POA: Diagnosis not present

## 2018-07-12 DIAGNOSIS — R0902 Hypoxemia: Secondary | ICD-10-CM | POA: Diagnosis not present

## 2018-07-12 DIAGNOSIS — Z87442 Personal history of urinary calculi: Secondary | ICD-10-CM

## 2018-07-12 DIAGNOSIS — I4430 Unspecified atrioventricular block: Secondary | ICD-10-CM | POA: Diagnosis not present

## 2018-07-12 LAB — CBC WITH DIFFERENTIAL/PLATELET
Abs Immature Granulocytes: 0.01 10*3/uL (ref 0.00–0.07)
Basophils Absolute: 0 10*3/uL (ref 0.0–0.1)
Basophils Relative: 0 %
Eosinophils Absolute: 0.1 10*3/uL (ref 0.0–0.5)
Eosinophils Relative: 1 %
HCT: 42.3 % (ref 36.0–46.0)
Hemoglobin: 12.7 g/dL (ref 12.0–15.0)
Immature Granulocytes: 0 %
Lymphocytes Relative: 19 %
Lymphs Abs: 0.8 10*3/uL (ref 0.7–4.0)
MCH: 25.8 pg — ABNORMAL LOW (ref 26.0–34.0)
MCHC: 30 g/dL (ref 30.0–36.0)
MCV: 86 fL (ref 80.0–100.0)
Monocytes Absolute: 0.3 10*3/uL (ref 0.1–1.0)
Monocytes Relative: 7 %
NEUTROS PCT: 73 %
Neutro Abs: 3.3 10*3/uL (ref 1.7–7.7)
Platelets: 160 10*3/uL (ref 150–400)
RBC: 4.92 MIL/uL (ref 3.87–5.11)
RDW: 15.4 % (ref 11.5–15.5)
WBC: 4.5 10*3/uL (ref 4.0–10.5)
nRBC: 0 % (ref 0.0–0.2)

## 2018-07-12 LAB — URINALYSIS, ROUTINE W REFLEX MICROSCOPIC
Bilirubin Urine: NEGATIVE
Glucose, UA: NEGATIVE mg/dL
Ketones, ur: NEGATIVE mg/dL
Leukocytes,Ua: NEGATIVE
Nitrite: NEGATIVE
Protein, ur: 30 mg/dL — AB
Specific Gravity, Urine: 1.026 (ref 1.005–1.030)
pH: 5 (ref 5.0–8.0)

## 2018-07-12 LAB — COMPREHENSIVE METABOLIC PANEL
ALT: 15 U/L (ref 0–44)
AST: 23 U/L (ref 15–41)
Albumin: 3.6 g/dL (ref 3.5–5.0)
Alkaline Phosphatase: 85 U/L (ref 38–126)
Anion gap: 10 (ref 5–15)
BUN: 21 mg/dL (ref 8–23)
CO2: 27 mmol/L (ref 22–32)
Calcium: 9.1 mg/dL (ref 8.9–10.3)
Chloride: 99 mmol/L (ref 98–111)
Creatinine, Ser: 1.16 mg/dL — ABNORMAL HIGH (ref 0.44–1.00)
GFR calc non Af Amer: 48 mL/min — ABNORMAL LOW (ref >60)
GFR, EST AFRICAN AMERICAN: 56 mL/min — AB (ref >60)
Glucose, Bld: 95 mg/dL (ref 70–99)
Potassium: 4.1 mmol/L (ref 3.5–5.1)
Sodium: 136 mmol/L (ref 135–145)
Total Bilirubin: 0.8 mg/dL (ref 0.3–1.2)
Total Protein: 6.8 g/dL (ref 6.5–8.1)

## 2018-07-12 LAB — RESPIRATORY PANEL BY PCR
Adenovirus: NOT DETECTED
BORDETELLA PERTUSSIS-RVPCR: NOT DETECTED
Chlamydophila pneumoniae: NOT DETECTED
Coronavirus 229E: DETECTED — AB
Coronavirus HKU1: NOT DETECTED
Coronavirus NL63: NOT DETECTED
Coronavirus OC43: NOT DETECTED
Influenza A: NOT DETECTED
Influenza B: NOT DETECTED
Metapneumovirus: NOT DETECTED
Mycoplasma pneumoniae: NOT DETECTED
Parainfluenza Virus 1: NOT DETECTED
Parainfluenza Virus 2: NOT DETECTED
Parainfluenza Virus 3: NOT DETECTED
Parainfluenza Virus 4: NOT DETECTED
Respiratory Syncytial Virus: NOT DETECTED
Rhinovirus / Enterovirus: NOT DETECTED

## 2018-07-12 LAB — CBG MONITORING, ED: Glucose-Capillary: 88 mg/dL (ref 70–99)

## 2018-07-12 LAB — LACTIC ACID, PLASMA: Lactic Acid, Venous: 0.6 mmol/L (ref 0.5–1.9)

## 2018-07-12 MED ORDER — BISACODYL 5 MG PO TBEC: 5.0000 mg | DELAYED_RELEASE_TABLET | Freq: Every day | ORAL | Status: DC | PRN

## 2018-07-12 MED ORDER — VITAMIN D 25 MCG (1000 UNIT) PO TABS
2000.0000 [IU] | ORAL_TABLET | Freq: Every day | ORAL | Status: DC
  Administered 2018-07-13: 2000 [IU] via ORAL
  Filled 2018-07-12: qty 2

## 2018-07-12 MED ORDER — GADOBUTROL 1 MMOL/ML IV SOLN
6.0000 mL | Freq: Once | INTRAVENOUS | Status: AC | PRN
  Administered 2018-07-12: 6 mL via INTRAVENOUS

## 2018-07-12 MED ORDER — SODIUM CHLORIDE 0.9 % IV SOLN
500.0000 mg | Freq: Once | INTRAVENOUS | Status: AC
  Administered 2018-07-12: 500 mg via INTRAVENOUS
  Filled 2018-07-12: qty 500

## 2018-07-12 MED ORDER — ACETAMINOPHEN 650 MG RE SUPP: 650.0000 mg | Freq: Four times a day (QID) | RECTAL | Status: DC | PRN

## 2018-07-12 MED ORDER — ACETAMINOPHEN 325 MG PO TABS
650.0000 mg | ORAL_TABLET | Freq: Once | ORAL | Status: AC
  Administered 2018-07-12: 650 mg via ORAL
  Filled 2018-07-12: qty 2

## 2018-07-12 MED ORDER — ROSUVASTATIN CALCIUM 5 MG PO TABS
5.0000 mg | ORAL_TABLET | Freq: Every day | ORAL | Status: DC
  Administered 2018-07-13: 5 mg via ORAL
  Filled 2018-07-12: qty 1

## 2018-07-12 MED ORDER — TRAZODONE HCL 150 MG PO TABS
150.0000 mg | ORAL_TABLET | Freq: Every day | ORAL | Status: DC
  Administered 2018-07-13: 150 mg via ORAL
  Filled 2018-07-12: qty 1

## 2018-07-12 MED ORDER — ATENOLOL 100 MG PO TABS
100.0000 mg | ORAL_TABLET | Freq: Every day | ORAL | Status: DC
  Administered 2018-07-13: 100 mg via ORAL
  Filled 2018-07-12: qty 1

## 2018-07-12 MED ORDER — FENTANYL CITRATE (PF) 100 MCG/2ML IJ SOLN
25.0000 ug | Freq: Three times a day (TID) | INTRAMUSCULAR | Status: DC | PRN
  Administered 2018-07-13: 25 ug via INTRAVENOUS
  Filled 2018-07-12: qty 2

## 2018-07-12 MED ORDER — LACTATED RINGERS IV BOLUS (SEPSIS)
1000.0000 mL | Freq: Once | INTRAVENOUS | Status: AC
  Administered 2018-07-12: 1000 mL via INTRAVENOUS

## 2018-07-12 MED ORDER — ENOXAPARIN SODIUM 40 MG/0.4ML ~~LOC~~ SOLN
40.0000 mg | SUBCUTANEOUS | Status: DC
  Administered 2018-07-13: 40 mg via SUBCUTANEOUS
  Filled 2018-07-12: qty 0.4

## 2018-07-12 MED ORDER — B COMPLEX PO TABS: 1.0000 | ORAL_TABLET | Freq: Every day | ORAL | Status: DC

## 2018-07-12 MED ORDER — SODIUM CHLORIDE 0.9 % IV SOLN
1.0000 g | Freq: Once | INTRAVENOUS | Status: AC
  Administered 2018-07-12: 1 g via INTRAVENOUS
  Filled 2018-07-12: qty 10

## 2018-07-12 MED ORDER — ONDANSETRON HCL 4 MG PO TABS: 4.0000 mg | ORAL_TABLET | Freq: Four times a day (QID) | ORAL | Status: DC | PRN

## 2018-07-12 MED ORDER — PANTOPRAZOLE SODIUM 40 MG PO TBEC
40.0000 mg | DELAYED_RELEASE_TABLET | Freq: Two times a day (BID) | ORAL | Status: DC
  Administered 2018-07-13: 40 mg via ORAL
  Filled 2018-07-12: qty 1

## 2018-07-12 MED ORDER — METRONIDAZOLE IN NACL 5-0.79 MG/ML-% IV SOLN
500.0000 mg | Freq: Three times a day (TID) | INTRAVENOUS | Status: DC
  Administered 2018-07-12 – 2018-07-13 (×3): 500 mg via INTRAVENOUS
  Filled 2018-07-12 (×3): qty 100

## 2018-07-12 MED ORDER — IOHEXOL 350 MG/ML SOLN
60.0000 mL | Freq: Once | INTRAVENOUS | Status: AC | PRN
  Administered 2018-07-12: 60 mL via INTRAVENOUS

## 2018-07-12 MED ORDER — LEVOFLOXACIN IN D5W 500 MG/100ML IV SOLN: 500.0000 mg | INTRAVENOUS | Status: DC

## 2018-07-12 MED ORDER — BENZONATATE 100 MG PO CAPS: 200.0000 mg | ORAL_CAPSULE | Freq: Three times a day (TID) | ORAL | Status: DC | PRN

## 2018-07-12 MED ORDER — ALBUTEROL SULFATE (2.5 MG/3ML) 0.083% IN NEBU: 2.5000 mg | INHALATION_SOLUTION | RESPIRATORY_TRACT | Status: DC | PRN

## 2018-07-12 MED ORDER — POTASSIUM CHLORIDE ER 10 MEQ PO TBCR
10.0000 meq | EXTENDED_RELEASE_TABLET | Freq: Every day | ORAL | Status: DC
  Administered 2018-07-13: 10 meq via ORAL
  Filled 2018-07-12 (×2): qty 1

## 2018-07-12 MED ORDER — IOHEXOL 300 MG/ML  SOLN
80.0000 mL | Freq: Once | INTRAMUSCULAR | Status: AC | PRN
  Administered 2018-07-12: 80 mL via INTRAVENOUS

## 2018-07-12 MED ORDER — ACETAMINOPHEN 325 MG PO TABS
650.0000 mg | ORAL_TABLET | Freq: Four times a day (QID) | ORAL | Status: DC | PRN
  Administered 2018-07-13: 650 mg via ORAL
  Filled 2018-07-12: qty 2

## 2018-07-12 MED ORDER — POLYETHYLENE GLYCOL 3350 17 G PO PACK: 17.0000 g | PACK | Freq: Every day | ORAL | Status: DC | PRN

## 2018-07-12 MED ORDER — ADULT MULTIVITAMIN W/MINERALS CH
1.0000 | ORAL_TABLET | Freq: Every day | ORAL | Status: DC
  Administered 2018-07-13: 1 via ORAL
  Filled 2018-07-12: qty 1

## 2018-07-12 MED ORDER — GABAPENTIN 300 MG PO CAPS
300.0000 mg | ORAL_CAPSULE | Freq: Three times a day (TID) | ORAL | Status: DC
  Administered 2018-07-13 (×2): 300 mg via ORAL
  Filled 2018-07-12 (×2): qty 1

## 2018-07-12 MED ORDER — ACETAMINOPHEN 325 MG PO TABS: 325.0000 mg | ORAL_TABLET | Freq: Four times a day (QID) | ORAL | Status: DC | PRN

## 2018-07-12 MED ORDER — ONDANSETRON HCL 4 MG/2ML IJ SOLN: 4.0000 mg | Freq: Four times a day (QID) | INTRAMUSCULAR | Status: DC | PRN

## 2018-07-12 MED ORDER — DILTIAZEM HCL ER COATED BEADS 180 MG PO CP24
180.0000 mg | ORAL_CAPSULE | Freq: Every day | ORAL | Status: DC
  Administered 2018-07-13: 180 mg via ORAL
  Filled 2018-07-12: qty 1

## 2018-07-12 MED ORDER — PAROXETINE HCL 20 MG PO TABS
40.0000 mg | ORAL_TABLET | Freq: Two times a day (BID) | ORAL | Status: DC
  Administered 2018-07-13 (×2): 40 mg via ORAL
  Filled 2018-07-12 (×3): qty 2

## 2018-07-12 NOTE — ED Notes (Signed)
Patient transported to CT 

## 2018-07-12 NOTE — ED Provider Notes (Signed)
Assumed care of patient by Dr. Maryan Rued at 4 PM.  Patient with fever, fatigue, diarrhea, viral symptoms.  Given IV antibiotics as concern for sepsis.  No obvious source at this time.  Awaiting CT abdomen and pelvis as concern for possible colitis.  Patient with no leukocytosis, no lactic acidosis.  No urinary tract infection.  Patient lives at assisted living where multiple people sick with similar symptoms.  Patient has had hypoxia and is on 2 L of oxygen.  No history of lung disease.  No obvious pneumonia was seen on CT scan.  Patient with blood cultures collected.  Will likely need admission for further sepsis care.  RVP is pending.  Patient incidentally found to have common bile duct and pancreatic duct dilation but no obvious signs of obstruction.  Gallbladder and liver labs unremarkable.  Lipase has been added.  Patient with sepsis from likely viral source.  Talked with the hospitalist who would like infectious disease to make recommendations about possible coronavirus testing given fever, hypoxia with possible groundglass opacities in the lung which per radiology are favored to be fluid.  Patient does live in assisted living.  Patient does not have any abdominal pain.  Is overall improved on 2 L of oxygen.  RVP has been ordered.  Will touch base with infectious disease.  We will also talk to GI about patient's need for MRCP versus ERCP however this can likely be done on a nonemergent basis.  Patient admitted to medicine for further care, awaiting consults to call back.  Gastroenterology recommends ordering an MRCP and will see the patient in the morning.  ID doctor on call recommends: Infection prevention who is in charge of coronavirus testing at this time.  Made nursing staff aware that we should maintain isolation until I am able to talk with infection prevention.  Infection prevention states that they will have a different infectious disease doctor call me to discuss case.  Infectious disease  doctors states that patient does not meet criteria for testing.  No precautions are needed.  Patient admitted to hospitalist service in good condition.  This chart was dictated using voice recognition software.  Despite best efforts to proofread,  errors can occur which can change the documentation meaning.    Lennice Sites, DO 07/12/18 1840

## 2018-07-12 NOTE — ED Notes (Signed)
Pt to MRI at this time.

## 2018-07-12 NOTE — Progress Notes (Signed)
-   patient has no known risk factors for COVID-19 disease - discussed with ED physician, that she does not appear to be a candidate for testing - if she clinically worsens, we will re-evaluate for testing  Josmar Messimer B. [facility] for Infectious Diseases [phone]

## 2018-07-12 NOTE — H&P (Addendum)
History and Physical    DOA: 07/12/2018  PCP: [name], MD  Patient coming from: Independent living facility  Chief Complaint: Altered mental status and fever  HPI: Bianca Haley is a 68 y.o. female with history h/o chronic back pain, arthritis, gastroesophageal reflux disease, hypertension, migraines, recurrent pneumonias brought in by husband this morning and concern for altered mental status and fever.  Patient tells me she has severe reflux disease due to which she sleeps at an incline of 45 to 90 degrees.  She lives in an independent living facility where apparently there has been an outbreak of gastroenteritis secondary to norovirus for which there was a health department survey conducted on Monday and patients with GI symptoms were quarantined with food being brought to the rooms.  Patient states she had diarrhea on Sunday 1-2 episodes of loose stools which resolved spontaneously.  She felt tired on Monday but woke up this morning confused.  Husband checked her temperature which was at 101F.  There is no report of cough or shortness of breath or abdominal pain although she reports feeling nauseous with poor appetite over the last 2 days.  Patient also on multiple medications for back pain (Neurontin, Opana as needed) and for anxiety (Valium as needed, Paxil and trazodone).  Patient reports taking 1 dose of Valium on Monday night and 1 dose of Opana 10 mg last night.  Husband was concerned that patient might have pneumonia given prior episodes of confusion in the setting of pneumonia requiring hospitalizations and brought her to ED for further evaluation. Denies any history of heart disease, recent leg swellings and reports good exercise tolerance with no recent changes.  Work-up in the ED revealed fever of 102 Fahrenheit, hypoxia on room air, normal white count, CT chest negative for PE but showing patchy bilateral infiltrates/edema, CT abdomen showing dilated biliary ducts and  possible gallbladder inflammation.  She received IV Rocephin/azithromycin.  Respiratory bio fire has been sent and pending at this time.  Patient is awake alert oriented x3 currently and able to participate in history and physical.    Review of Systems: As per HPI otherwise 10 point review of systems negative.    Past Medical History:  Diagnosis Date  . Anemia   . Anxiety   . Arthritis   . Back pain   . Chronic renal failure    patient denies   . GERD (gastroesophageal reflux disease)   . H/O tinnitus    left  . Headache(784.0)   . History of endometriosis   . History of uterine fibroid   . History of UTI   . Hypertension   . Migraine   . Recurrent pneumonia     Past Surgical History:  Procedure Laterality Date  . ABDOMINAL HYSTERECTOMY    . BACK SURGERY    . BLADDER SUSPENSION    . COLONOSCOPY  02/16/2008   normal  . laparoscopy     for evauation of endometriosis  . TOTAL KNEE ARTHROPLASTY Left 07/15/2015   Procedure: TOTAL LEFT KNEE ARTHROPLASTY;  Surgeon: Paralee Cancel, MD;  Location: [facility];  Service: Orthopedics;  Laterality: Left;    Social history:  reports that she has never smoked. She has never used smokeless tobacco. She reports that she does not drink alcohol or use drugs.   Allergies  Allergen Reactions  . Penicillins Shortness Of Breath and Rash    .Marland KitchenHas patient had a PCN reaction causing immediate rash, facial/tongue/throat swelling, SOB or lightheadedness with hypotension:  No Has patient had a PCN reaction causing severe rash involving mucus membranes or skin necrosis: No Has patient had a PCN reaction that required hospitalization No Has patient had a PCN reaction occurring within the last 10 years: No If all of the above answers are "NO", then may proceed with Cephalosporin use.   [name] Morphine And Related Swelling    Sedation/Swelling described like edema/bloating/doesn't help the pain Morphine only; tolerates other opioids.   . Cefepime Rash     Reaction while taking both cefepime and vancomycin at [facility] (after 3 days)  . Vancomycin Rash    Reaction while taking both cefepime and vancomycin at [facility] (after 3 days)    Family History  Problem Relation Age of Onset  . Emphysema Mother   . Heart disease Mother 58       MI      Prior to Admission medications   Medication Sig Start Date End Date Taking? Authorizing Provider  acetaminophen (TYLENOL) 325 MG tablet Take 1-2 tablets (325-650 mg total) by mouth every 6 (six) hours as needed for mild pain or moderate pain. 07/18/15  Yes Babish, Rodman Key, PA-C  albuterol (PROVENTIL HFA;VENTOLIN HFA) 108 (90 Base) MCG/ACT inhaler Inhale 2 puffs into the lungs every 4 (four) hours as needed for wheezing or shortness of breath. 08/07/17  Yes Midge Minium, MD  atenolol (TENORMIN) 100 MG tablet TAKE 1 TABLET(100 MG) BY MOUTH EVERY DAY Patient taking differently: Take 100 mg by mouth daily.  06/09/18  Yes Plotnikov, [name], MD  b complex vitamins tablet Take 1 tablet daily by mouth. 03/12/17  Yes Plotnikov, [name], MD  benzonatate (TESSALON) 200 MG capsule One every  8 hours as needed Patient taking differently: Take 200 mg by mouth 3 (three) times daily as needed for cough.  08/07/17  Yes Midge Minium, MD  Cholecalciferol (VITAMIN D) 2000 units tablet Take 2,000 Units by mouth daily.   Yes [provider]  diazepam (VALIUM) 5 MG tablet Take 1 tablet (5 mg total) by mouth as needed for anxiety or muscle spasms. 06/23/18  Yes Plotnikov, [name], MD  diltiazem (CARDIZEM CD) 180 MG 24 hr capsule Take 1 capsule (180 mg total) by mouth daily. 12/29/17  Yes Plotnikov, [name], MD  gabapentin (NEURONTIN) 300 MG capsule Take 600 capsules by mouth 3 (three) times daily.  01/09/14  Yes [provider]  Multiple Vitamin (MULTIVITAMIN WITH MINERALS) TABS tablet Take 1 tablet by mouth daily.   Yes [provider]  OVER THE COUNTER MEDICATION Place 1 application into both eyes  at bedtime as needed (dry eyes). Over the counter lubricant eye gel   Yes [provider]  oxymorphone (OPANA) 10 MG tablet Take 10 mg by mouth 2 (two) times daily as needed for pain.  09/29/13  Yes Samella Parr, NP  pantoprazole (PROTONIX) 40 MG tablet Take 1 tablet (40 mg total) by mouth 2 (two) times daily before a meal. 11/11/17  Yes Plotnikov, [name], MD  PARoxetine (PAXIL) 40 MG tablet Take 1 tablet (40 mg total) by mouth 2 (two) times daily. 10/07/17  Yes Plotnikov, [name], MD  polyethylene glycol (MIRALAX / GLYCOLAX) packet Take 17 g by mouth 2 (two) times daily. Patient taking differently: Take 17 g by mouth daily as needed (constipation). Mix in 8 oz liquid and drink 07/16/15  Yes Babish, Rodman Key, PA-C  potassium chloride (K-DUR) 10 MEQ tablet TAKE 1 TABLET(10 MEQ) BY MOUTH DAILY Patient taking differently:  Take 10 mEq by mouth daily.  07/05/18  Yes Plotnikov, [name], MD  rosuvastatin (CRESTOR) 5 MG tablet Take 1 tablet (5 mg total) by mouth daily. 09/03/17  Yes Plotnikov, [name], MD  SUMAtriptan (IMITREX) 100 MG tablet TAKE 1 TABLET BY MOUTH DAILY AS NEEDED FOR MIGRAINE, MAY REPEAT IN 2 HOURS IF HEADACHE PERSISTS OR RECURS Patient taking differently: Take 100 mg by mouth every 2 (two) hours as needed for migraine.  03/14/18  Yes Burns, Claudina Lick, MD  traZODone (DESYREL) 150 MG tablet Take 1 tablet (150 mg total) by mouth at bedtime. 01/31/18  Yes Plotnikov, [name], MD    Physical Exam: Vitals:   07/12/18 1157 07/12/18 1200 07/12/18 1240 07/12/18 1458  BP:  (!) 122/54    Pulse:  60    Resp:  14    Temp:  99.8 F (37.7 C) (!) 102 F (38.9 C) 98.7 F (37.1 C)  TempSrc:  Oral Rectal Oral  SpO2: 97% 98%      Constitutional: NAD, calm, comfortable Eyes: PERRL, lids and conjunctivae normal, anicteric ENMT: Mucous membranes are moist. Posterior pharynx clear of any exudate or lesions.Normal dentition.  Neck: normal, supple, no masses, no thyromegaly Respiratory:  clear to auscultation bilaterally, no wheezing, no crackles. Normal respiratory effort. No accessory muscle use.  Cardiovascular: Regular rate and rhythm, no murmurs / rubs / gallops. No extremity edema. 2+ pedal pulses. No carotid bruits.  Abdomen: Right upper quadrant tenderness without guarding or rebound., no masses palpated. No hepatosplenomegaly. Bowel sounds positive.  Musculoskeletal: no clubbing / cyanosis. No joint deformity upper and lower extremities. Good ROM, no contractures. Normal muscle tone.  Neurologic: CN 2-12 grossly intact. Sensation intact, DTR normal. Strength 5/5 in all 4.  Psychiatric: Normal judgment and insight. Alert and oriented x 3. Normal mood.  SKIN/catheters: no rashes, lesions, ulcers. No induration  Labs on Admission: I have personally reviewed following labs and imaging studies  CBC: Recent Labs  Lab 07/12/18 1233  WBC 4.5  NEUTROABS 3.3  HGB 12.7  HCT 42.3  MCV 86.0  PLT 161   Basic Metabolic Panel: Recent Labs  Lab 07/12/18 1233  NA 136  K 4.1  CL 99  CO2 27  GLUCOSE 95  BUN 21  CREATININE 1.16*  CALCIUM 9.1   GFR: CrCl cannot be calculated (Unknown ideal weight.). Liver Function Tests: Recent Labs  Lab 07/12/18 1233  AST 23  ALT 15  ALKPHOS 85  BILITOT 0.8  PROT 6.8  ALBUMIN 3.6   No results for input(s): LIPASE, AMYLASE in the last 168 hours. No results for input(s): AMMONIA in the last 168 hours. Coagulation Profile: No results for input(s): INR, PROTIME in the last 168 hours. Cardiac Enzymes: No results for input(s): CKTOTAL, CKMB, CKMBINDEX, TROPONINI in the last 168 hours. BNP (last 3 results) No results for input(s): PROBNP in the last 8760 hours. HbA1C: No results for input(s): HGBA1C in the last 72 hours. CBG: Recent Labs  Lab 07/12/18 1201  GLUCAP 88   Lipid Profile: No results for input(s): CHOL, HDL, LDLCALC, TRIG, CHOLHDL, LDLDIRECT in the last 72 hours. Thyroid Function Tests: No results for  input(s): TSH, T4TOTAL, FREET4, T3FREE, THYROIDAB in the last 72 hours. Anemia Panel: No results for input(s): VITAMINB12, FOLATE, FERRITIN, TIBC, IRON, RETICCTPCT in the last 72 hours. Urine analysis:    Component Value Date/Time   COLORURINE AMBER (A) 07/12/2018 1320   APPEARANCEUR HAZY (A) 07/12/2018 1320   LABSPEC 1.026 07/12/2018 1320  PHURINE 5.0 07/12/2018 1320   GLUCOSEU NEGATIVE 07/12/2018 1320   GLUCOSEU NEGATIVE 09/16/2016 1207   HGBUR SMALL (A) 07/12/2018 1320   BILIRUBINUR NEGATIVE 07/12/2018 1320   KETONESUR NEGATIVE 07/12/2018 1320   PROTEINUR 30 (A) 07/12/2018 1320   UROBILINOGEN 0.2 09/16/2016 1207   NITRITE NEGATIVE 07/12/2018 1320   LEUKOCYTESUR NEGATIVE 07/12/2018 1320    Radiological Exams on Admission: Dg Chest 2 View  Result Date: 07/12/2018 CLINICAL DATA:  Hypoxia and fever.  Cough. EXAM: CHEST - 2 VIEW COMPARISON:  June 29, 2017 FINDINGS: The heart size and mediastinal contours are within normal limits. Both lungs are clear. The visualized skeletal structures are unremarkable. IMPRESSION: No active cardiopulmonary disease. Electronically Signed   By: Dorise Bullion III M.D   On: 07/12/2018 13:43   Ct Angio Chest Pe W And/or Wo Contrast  Result Date: 07/12/2018 CLINICAL DATA:  68 year old female with history of dehydration and fever. EXAM: CT ANGIOGRAPHY CHEST WITH CONTRAST TECHNIQUE: Multidetector CT imaging of the chest was performed using the standard protocol during bolus administration of intravenous contrast. Multiplanar CT image reconstructions and MIPs were obtained to evaluate the vascular anatomy. CONTRAST:  38mL OMNIPAQUE IOHEXOL 350 MG/ML SOLN COMPARISON:  Chest CT 07/16/2015. FINDINGS: Cardiovascular: There are no filling defects within the pulmonary arterial tree to suggest underlying pulmonary embolism. Heart size is mildly enlarged. There is no significant pericardial fluid, thickening or pericardial calcification. Aortic atherosclerosis. No  coronary artery calcifications. Mediastinum/Nodes: No pathologically enlarged mediastinal or hilar lymph nodes. Esophagus is unremarkable in appearance. No axillary lymphadenopathy. Lungs/Pleura: Patchy areas of very subtle ground-glass attenuation are noted throughout the lungs bilaterally, favored to reflect a background of very mild interstitial pulmonary edema. Linear scarring in the inferior segment of the lingula. No acute consolidative airspace disease. No pleural effusions. No definite suspicious appearing pulmonary nodules or masses are noted. Upper Abdomen: Large mass-like area in the region of the upper abdomen extending toward the hepatic hilum, incompletely imaged (best appreciated on axial image 136 of series 6), highly concerning for neoplasm. Musculoskeletal: There are no aggressive appearing lytic or blastic lesions noted in the visualized portions of the skeleton. Review of the MIP images confirms the above findings. IMPRESSION: 1. No evidence of pulmonary embolism. 2. Mass-like area in the upper abdomen incompletely imaged, concerning for neoplasm. Further evaluation with dedicated CT the abdomen and pelvis with IV contrast is recommended at this time. 3. Mild cardiomegaly with evidence of very mild interstitial pulmonary edema in the lungs. 4. Aortic atherosclerosis. Aortic Atherosclerosis (ICD10-I70.0). Electronically Signed   By: Vinnie Langton M.D.   On: 07/12/2018 15:11   Ct Abdomen Pelvis W Contrast  Result Date: 07/12/2018 CLINICAL DATA:  68 year old female with abdominal pain and indeterminate low-density area in the UPPER abdomen on recent chest CT. EXAM: CT ABDOMEN AND PELVIS WITH CONTRAST TECHNIQUE: Multidetector CT imaging of the abdomen and pelvis was performed using the standard protocol following bolus administration of intravenous contrast. CONTRAST:  23mL OMNIPAQUE IOHEXOL 300 MG/ML  SOLN COMPARISON:  07/12/2018 chest CT and 08/17/2014 abdominal/pelvic CT. FINDINGS: Lower  chest: No acute abnormality Hepatobiliary: The gallbladder is distended with equivocal wall thickening and adjacent inflammation. There is CBD, mild intrahepatic and pancreatic ductal dilatation. CBD measures up to 11 mm with dilatation to the near the ampulla noted without definite obstructing cause. Gallbladder and biliary dilatation accounts for the abnormality on earlier chest CT. No other hepatic abnormalities identified. Pancreas: Pancreatic ductal dilatation noted without definite mass Spleen: Unremarkable Adrenals/Urinary Tract: The  kidneys, adrenal glands and bladder are unremarkable. Stomach/Bowel: Stomach is within normal limits. Appendix appears normal. No evidence of bowel wall thickening, distention, or inflammatory changes. Vascular/Lymphatic: Aortic atherosclerosis. No enlarged abdominal or pelvic lymph nodes. Reproductive: Status post hysterectomy. No adnexal masses. Other: No ascites, abscess or pneumoperitoneum. Musculoskeletal: No acute or suspicious bony abnormalities noted. Lumbar spine degenerative changes and facet arthropathy noted. IMPRESSION: 1. CBD and pancreatic ductal dilatation to the region of the ampulla without definite obstructing cause identified. GI consultation and MRCP/ERCP recommended. No pancreatic mass identified. 2. Distended gallbladder equivocal gallbladder wall thickening and adjacent inflammation which may represent acute cholecystitis. Gallbladder and biliary dilatation accounts for the abnormality on chest CT or earlier today. 3.  Aortic Atherosclerosis (ICD10-I70.0). Electronically Signed   By: Margarette Canada M.D.   On: 07/12/2018 16:09    EKG: Independently reviewed.  Normal sinus rhythm with LVH pattern and QTC of 436 ms.     Assessment and Plan:   1.  Altered mental status: Secondary to metabolic encephalopathy/infection versus toxic encephalopathy/medications.  Patient currently awake alert oriented x3.  She does report taking Valium and Opana in addition  to other scheduled psych medications in the last 2 days. She denies any headache or focal symptoms, unlikely to be stroke. U/A -ve.   2.  Fever/acute hypoxic respiratory failure: CT chest negative for PE but shows bilateral patchy infiltrates.  Viral/atypical pneumonia versus aspiration in the setting of multiple sedatives/severe reflux disease.  Patient currently saturating 88 to 91% on room air but does not appear to be symptomatic.  O2 2 L nasal cannula, titrate as tolerated.  EKG suggestive of LVH pattern.  Will check echo.  Empiric antibiotics to cover atypical organisms and aspiration pneumonia (changed to Levaquin /flagyl) .  Follow-up bio fire results and treat with Tamiflu if needed.  Possibility of coronavirus infection was raised and discussed with ID on call.  At this time patient does not meet criteria for screening per ID.   3.  Biliary dilatation: CT chest done initially to rule out PE raised the possibility of right upper quadrant mass and CT abdomen was obtained as a follow-up.  CT abdomen findings as described above showing hepatic and biliary ductal dilatation which appears to be chronic since 2016.  It also raises the possibility of gallbladder pathology.  Patient does have some right upper quadrant tenderness on exam and reports feeling nauseous for last 2 days.  Case was discussed by ED physician with GI who recommended MRCP which has been ordered.  Will keep n.p.o after MN. with IV fluids. Empiric abx (Levaquin/flagyl) and GI will evaluate in am  4.  Hypertension: Resume atenolol  5.  Chronic back pain: Patient agreeable with IV fentanyl as needed while here as Opana nonformulary.  She reports intolerance to morphine p.o,  resumed Neurontin dosage  6.  Anxiety/depression: Resume home medications.  Hold Valium for now.  Can give as needed if needed for MRCP premedication.  7. GERD: PPI.   DVT prophylaxis:    Lovenox  Code Status: Full code  Family Communication: Discussed  with patient. Health care proxy would be her husband Consults called: GI Admission status:  I certify that at the point of admission it is my clinical judgment that the patient will require inpatient hospital care spanning beyond 2 midnights from the point of admission due to high intensity of service and high frequency of surveillance required.Inpatient status is judged to be reasonable and necessary in order to provide the  required intensity of service to ensure the patient's safety. The patient's presenting symptoms, physical exam findings, and initial radiographic and laboratory data in the context of their chronic comorbidities is felt to place them at high risk for further clinical deterioration. The following factors support the patient status of inpatient : Hypoxic respiratory failure, metabolic encephalopathy, infectious process requiring IV antibiotics.  Guilford Shi MD [facility] Pager [phone]  If 7PM-7AM, please contact night-coverage [email or web address] Password [facility]  07/12/2018, 7:58 PM

## 2018-07-12 NOTE — ED Provider Notes (Addendum)
[facility] EMERGENCY DEPARTMENT Provider Note   CSN: [id number] Arrival date & time: 07/12/18  1156    History   Chief Complaint Chief Complaint  Patient presents with  . Altered Mental Status  . Diarrhea    HPI Bianca Haley is a 68 y.o. female.     Patient is a 68 year old female with a history of chronic kidney disease, UTIs, anemia who lives with her husband at an assisted living facility being brought in today for confusion and hypoxia.  Husband states that where they live everybody has been on lockdown because of the GI bug.  They have not been leaving their apartment and they have been bringing all the food to them.  Patient herself has had diarrhea since Saturday and had general malaise.  She has had multiple episodes of diarrhea every day that is been nonbloody.  She has not had any nausea or vomiting but has had decreased oral intake.  Also she is noticed a mild dry cough but denies any shortness of breath.  Patient states last night she did not feel great but had a bowl of ice cream and went to bed.  Husband states when she woke up this morning she was extremely confused and doing very unusual things.  He called 911 and she was found to be hypoxic with oxygen saturation of 78% on room air.  She does not normally use oxygen at home but does use an inhaler.  When she was given oxygen she started to act more normal.  She currently denies any shortness of breath.  She states she has some mild lower abdominal pain but otherwise feels fine.  She has not had any antibiotics in the last few months and denies any recent medication changes.  The history is provided by the patient and the spouse.  Altered Mental Status  Presenting symptoms: confusion and disorientation   Severity:  Severe Most recent episode:  Today Episode history:  Single Duration:  1 hour Timing:  Constant Progression:  Resolved Chronicity:  New Context: recent illness and recent infection     Associated symptoms: abdominal pain   Associated symptoms comment:  Diarrhea, cough and congestion. Diarrhea  Associated symptoms: abdominal pain     Past Medical History:  Diagnosis Date  . Anemia   . Anxiety   . Arthritis   . Back pain   . Chronic renal failure    patient denies   . GERD (gastroesophageal reflux disease)   . H/O tinnitus    left  . Headache(784.0)   . History of endometriosis   . History of uterine fibroid   . History of UTI   . Hypertension   . Migraine   . Recurrent pneumonia     Patient Active Problem List   Diagnosis Date Noted  . Hyperglycemia 06/23/2018  . Upper airway cough syndrome 06/12/2017  . Memory loss 03/12/2017  . Left foot infection 02/08/2017  . Deep incisional surgical site infection 11/09/2016  . Allergic reaction caused by a drug 11/06/2016  . Fever 09/16/2016  . Body aches 09/16/2016  . Confusion 09/16/2016  . Cough 09/16/2016  . Edema 09/15/2016  . Urinary tract infection 08/14/2016  . Cerumen impaction 06/11/2016  . Posterior tibial tendon dysfunction (PTTD) of left lower extremity 04/13/2016  . Dyslipidemia 09/25/2015  . S/P left TKA 07/15/2015  . S/P knee replacement 07/15/2015  . Esophageal dysphagia 09/18/2014  . GERD (gastroesophageal reflux disease) 09/04/2014  . Chronic narcotic dependence ([place])  09/04/2014  . Gross hematuria 08/31/2014  . Hemorrhagic cystitis 08/24/2014  . Bilateral lower extremity edema 04/23/2014  . Osteoarthritis of left knee 10/25/2013  . Knee pain, left 10/13/2013  . Left leg swelling 10/13/2013  . Chronic neck and back pain 09/28/2013  . Hypokalemia 09/28/2013  . Abnormal CT scan, head 09/27/2013  . Diastolic dysfunction- grade I 02/24/2013  . Recurrent pneumonia 02/21/2013  . Chronic cough 01/04/2013  . Contracture of palmar fascia (Dupuytren's) 08/04/2011  . VENOUS INSUFFICIENCY, LEFT LEG 05/14/2010  . CRF (chronic renal failure) 12/02/2009  . INGUINAL PAIN, RIGHT 12/02/2009  .  UNSPECIFIED ANEMIA 11/28/2009  . PULMONARY NODULE 05/10/2008  . FIBROIDS, UTERUS 08/03/2007  . Anxiety state 08/03/2007  . MIGRAINE HEADACHE 08/03/2007  . TINNITUS, LEFT 08/03/2007  . Essential hypertension 08/03/2007  . ENDOMETRIOSIS 08/03/2007  . ALCOHOL ABUSE, HX OF 08/03/2007  . MEASLES, HX OF 08/03/2007  . Personal history of urinary disorder 08/03/2007    Past Surgical History:  Procedure Laterality Date  . ABDOMINAL HYSTERECTOMY    . BACK SURGERY    . BLADDER SUSPENSION    . COLONOSCOPY  02/16/2008   normal  . laparoscopy     for evauation of endometriosis  . TOTAL KNEE ARTHROPLASTY Left 07/15/2015   Procedure: TOTAL LEFT KNEE ARTHROPLASTY;  Surgeon: Paralee Cancel, MD;  Location: [facility];  Service: Orthopedics;  Laterality: Left;     OB History    Gravida  2   Para  2   Term  2   Preterm      AB  0   Living  2     SAB  0   TAB  0   Ectopic  0   Multiple  0   Live Births               Home Medications    Prior to Admission medications   Medication Sig Start Date End Date Taking? Authorizing Provider  acetaminophen (TYLENOL) 325 MG tablet Take 1-2 tablets (325-650 mg total) by mouth every 6 (six) hours as needed for mild pain or moderate pain. 07/18/15   Danae Orleans, PA-C  albuterol (PROVENTIL HFA;VENTOLIN HFA) 108 (90 Base) MCG/ACT inhaler Inhale 2 puffs into the lungs every 4 (four) hours as needed for wheezing or shortness of breath. 08/07/17   Midge Minium, MD  aspirin 325 MG tablet Take 325 mg by mouth 2 (two) times daily.    [provider]  atenolol (TENORMIN) 100 MG tablet TAKE 1 TABLET(100 MG) BY MOUTH EVERY DAY 06/09/18   Plotnikov, [name], MD  b complex vitamins tablet Take 1 tablet daily by mouth. 03/12/17   Plotnikov, [name], MD  benzonatate (TESSALON) 200 MG capsule One every  8 hours as needed 08/07/17   Midge Minium, MD  Cholecalciferol (VITAMIN D) 2000 units tablet Take 2,000 Units by mouth daily.     [provider]  diazepam (VALIUM) 5 MG tablet Take 1 tablet (5 mg total) by mouth as needed for anxiety or muscle spasms. 06/23/18   Plotnikov, [name], MD  diltiazem (CARDIZEM CD) 180 MG 24 hr capsule Take 1 capsule (180 mg total) by mouth daily. 12/29/17   Plotnikov, [name], MD  gabapentin (NEURONTIN) 300 MG capsule Take 600 capsules by mouth 3 (three) times daily.  01/09/14   [provider]  Multiple Vitamin (MULTIVITAMIN WITH MINERALS) TABS tablet Take 1 tablet by mouth daily.    [provider]  OVER THE  COUNTER MEDICATION Place 1 application into both eyes at bedtime as needed (dry eyes). Over the counter lubricant eye gel    [provider]  oxymorphone (OPANA) 10 MG tablet Take 5 mg by mouth as needed for pain. Reported on 09/25/2015 09/29/13   Samella Parr, NP  pantoprazole (PROTONIX) 40 MG tablet Take 1 tablet (40 mg total) by mouth 2 (two) times daily before a meal. 11/11/17   Plotnikov, [name], MD  PARoxetine (PAXIL) 40 MG tablet Take 1 tablet (40 mg total) by mouth 2 (two) times daily. 10/07/17   Plotnikov, [name], MD  polyethylene glycol (MIRALAX / GLYCOLAX) packet Take 17 g by mouth 2 (two) times daily. Patient taking differently: Take 17 g by mouth daily as needed (constipation). Mix in 8 oz liquid and drink 07/16/15   Danae Orleans, PA-C  potassium chloride (K-DUR) 10 MEQ tablet TAKE 1 TABLET(10 MEQ) BY MOUTH DAILY 07/05/18   Plotnikov, [name], MD  rosuvastatin (CRESTOR) 5 MG tablet Take 1 tablet (5 mg total) by mouth daily. 09/03/17   Plotnikov, [name], MD  SUMAtriptan (IMITREX) 100 MG tablet TAKE 1 TABLET BY MOUTH DAILY AS NEEDED FOR MIGRAINE, MAY REPEAT IN 2 HOURS IF HEADACHE PERSISTS OR RECURS 03/14/18   Binnie Rail, MD  traZODone (DESYREL) 150 MG tablet Take 1 tablet (150 mg total) by mouth at bedtime. 01/31/18   Plotnikov, [name], MD    Family History Family History  Problem Relation Age of Onset  . Emphysema Mother   .  Heart disease Mother 55       MI    Social History Social History   Tobacco Use  . Smoking status: Never Smoker  . Smokeless tobacco: Never Used  Substance Use Topics  . Alcohol use: No    Alcohol/week: 0.0 standard drinks    Comment: no drinking for sevral months  . Drug use: No     Allergies   Penicillins; Morphine and related; Cefepime; and Vancomycin   Review of Systems Review of Systems  Gastrointestinal: Positive for abdominal pain and diarrhea.  Psychiatric/Behavioral: Positive for confusion.  All other systems reviewed and are negative.    Physical Exam Updated Vital Signs BP (!) 122/54   Pulse 60   Temp (!) 102 F (38.9 C) (Rectal)   Resp 14   SpO2 98%   Physical Exam Vitals signs and nursing note reviewed.  Constitutional:      General: She is not in acute distress.    Appearance: She is well-developed.  HENT:     Head: Normocephalic and atraumatic.     Right Ear: Tympanic membrane normal.     Left Ear: Tympanic membrane normal.     Mouth/Throat:     Mouth: Mucous membranes are dry.  Eyes:     Pupils: Pupils are equal, round, and reactive to light.  Cardiovascular:     Rate and Rhythm: Normal rate and regular rhythm.     Heart sounds: Normal heart sounds. No murmur. No friction rub.  Pulmonary:     Effort: Pulmonary effort is normal.     Breath sounds: Rales present. No wheezing.     Comments: Fine crackles heard at the bases bilaterally Abdominal:     General: Bowel sounds are normal. There is no distension.     Palpations: Abdomen is soft.     Tenderness: There is no abdominal tenderness. There is no guarding or rebound.  Musculoskeletal: Normal range of motion.  General: No tenderness.     Right lower leg: No edema.     Left lower leg: No edema.     Comments: No edema  Skin:    General: Skin is warm and dry.     Findings: No rash.  Neurological:     General: No focal deficit present.     Mental Status: She is alert and  oriented to person, place, and time. Mental status is at baseline.     Cranial Nerves: No cranial nerve deficit.  Psychiatric:        Mood and Affect: Mood normal.        Behavior: Behavior normal.        Thought Content: Thought content normal.      ED Treatments / Results  Labs (all labs ordered are listed, but only abnormal results are displayed) Labs Reviewed  COMPREHENSIVE METABOLIC PANEL - Abnormal; Notable for the following components:      Result Value   Creatinine, Ser 1.16 (*)    GFR calc non Af Amer 48 (*)    GFR calc Af Amer 56 (*)    All other components within normal limits  CBC WITH DIFFERENTIAL/PLATELET - Abnormal; Notable for the following components:   MCH 25.8 (*)    All other components within normal limits  URINALYSIS, ROUTINE W REFLEX MICROSCOPIC - Abnormal; Notable for the following components:   Color, Urine AMBER (*)    APPearance HAZY (*)    Hgb urine dipstick SMALL (*)    Protein, ur 30 (*)    Bacteria, UA RARE (*)    All other components within normal limits  CULTURE, BLOOD (ROUTINE X 2)  CULTURE, BLOOD (ROUTINE X 2)  URINE CULTURE  RESPIRATORY PANEL BY PCR  LACTIC ACID, PLASMA  LACTIC ACID, PLASMA  CBG MONITORING, ED    EKG EKG Interpretation  Date/Time:  Tuesday July 12 2018 11:58:07 EDT Ventricular Rate:  62 PR Interval:    QRS Duration: 96 QT Interval:  429 QTC Calculation: 436 R Axis:   45 Text Interpretation:  Sinus rhythm Borderline prolonged PR interval Consider left atrial enlargement Probable left ventricular hypertrophy No significant change since last tracing Confirmed by Blanchie Dessert [phone]) on 07/12/2018 12:13:25 PM   Radiology Dg Chest 2 View  Result Date: 07/12/2018 CLINICAL DATA:  Hypoxia and fever.  Cough. EXAM: CHEST - 2 VIEW COMPARISON:  June 29, 2017 FINDINGS: The heart size and mediastinal contours are within normal limits. Both lungs are clear. The visualized skeletal structures are unremarkable.  IMPRESSION: No active cardiopulmonary disease. Electronically Signed   By: Dorise Bullion III M.D   On: 07/12/2018 13:43   Ct Angio Chest Pe W And/or Wo Contrast  Result Date: 07/12/2018 CLINICAL DATA:  68 year old female with history of dehydration and fever. EXAM: CT ANGIOGRAPHY CHEST WITH CONTRAST TECHNIQUE: Multidetector CT imaging of the chest was performed using the standard protocol during bolus administration of intravenous contrast. Multiplanar CT image reconstructions and MIPs were obtained to evaluate the vascular anatomy. CONTRAST:  4mL OMNIPAQUE IOHEXOL 350 MG/ML SOLN COMPARISON:  Chest CT 07/16/2015. FINDINGS: Cardiovascular: There are no filling defects within the pulmonary arterial tree to suggest underlying pulmonary embolism. Heart size is mildly enlarged. There is no significant pericardial fluid, thickening or pericardial calcification. Aortic atherosclerosis. No coronary artery calcifications. Mediastinum/Nodes: No pathologically enlarged mediastinal or hilar lymph nodes. Esophagus is unremarkable in appearance. No axillary lymphadenopathy. Lungs/Pleura: Patchy areas of very subtle ground-glass attenuation are noted throughout the lungs  bilaterally, favored to reflect a background of very mild interstitial pulmonary edema. Linear scarring in the inferior segment of the lingula. No acute consolidative airspace disease. No pleural effusions. No definite suspicious appearing pulmonary nodules or masses are noted. Upper Abdomen: Large mass-like area in the region of the upper abdomen extending toward the hepatic hilum, incompletely imaged (best appreciated on axial image 136 of series 6), highly concerning for neoplasm. Musculoskeletal: There are no aggressive appearing lytic or blastic lesions noted in the visualized portions of the skeleton. Review of the MIP images confirms the above findings. IMPRESSION: 1. No evidence of pulmonary embolism. 2. Mass-like area in the upper abdomen  incompletely imaged, concerning for neoplasm. Further evaluation with dedicated CT the abdomen and pelvis with IV contrast is recommended at this time. 3. Mild cardiomegaly with evidence of very mild interstitial pulmonary edema in the lungs. 4. Aortic atherosclerosis. Aortic Atherosclerosis (ICD10-I70.0). Electronically Signed   By: Vinnie Langton M.D.   On: 07/12/2018 15:11    Procedures Procedures (including critical care time)  Medications Ordered in ED Medications  lactated ringers bolus 1,000 mL (1,000 mLs Intravenous New Bag/Given 07/12/18 1300)    And  lactated ringers bolus 1,000 mL (has no administration in time range)  acetaminophen (TYLENOL) tablet 650 mg (has no administration in time range)     Initial Impression / Assessment and Plan / ED Course  I have reviewed the triage vital signs and the nursing notes.  Pertinent labs & imaging results that were available during my care of the patient were reviewed by me and considered in my medical decision making (see chart for details).       Patient presenting today with hypoxia, confusion and a temperature of 102.  Concern for sepsis with patient's recent GI illness.  However with the hypoxia and new cough with prior history of pneumonia concern for new developing pneumonia as her source.  Sepsis labs pending.  Patient given 30/kg bolus and Tylenol for fever. 3:50 PM Labs without significant findings and all mostly within normal normal limits.  EKG without acute findings.  Chest x-ray within normal limits.  CT showed some ground glass patchiness in bilateral lungs which initially they said could be pulmonary edema but I suspect most likely infectious etiology.  Also they show concern for neoplasm in the abdomen and a CT was done.  Given patient's hypoxia fever and confusion earlier today feel that she will need to be admitted for ongoing antibiotics.  CRITICAL CARE Performed by: Sherwood Castilla Total critical care time: 30  minutes Critical care time was exclusive of separately billable procedures and treating other patients. Critical care was necessary to treat or prevent imminent or life-threatening deterioration. Critical care was time spent personally by me on the following activities: development of treatment plan with patient and/or surrogate as well as nursing, discussions with consultants, evaluation of patient's response to treatment, examination of patient, obtaining history from patient or surrogate, ordering and performing treatments and interventions, ordering and review of laboratory studies, ordering and review of radiographic studies, pulse oximetry and re-evaluation of patient's condition.   Final Clinical Impressions(s) / ED Diagnoses   Final diagnoses:  None    ED Discharge Orders    None       Blanchie Dessert, MD 07/12/18 1550    Blanchie Dessert, MD 07/25/18 2143

## 2018-07-12 NOTE — ED Triage Notes (Addendum)
Pt arrives via [facility] from [facility]. Pt endorses diarrhea since Saturday (EMS states bug going around facility). On EMS arrival pt was more lethargic/confused than baseline starting upon awakening. Husband endorses similar presentation in the past with dehydration and fever. Pt was initially 78% on RA at rest. Pt placed on 4L [place] and up to 98%. Pt reports she does not feel as confused. Pt alert and oriented x4. Hx of a fib, took diltiazem today.

## 2018-07-13 ENCOUNTER — Encounter (HOSPITAL_COMMUNITY): Payer: Self-pay | Admitting: Physician Assistant

## 2018-07-13 ENCOUNTER — Inpatient Hospital Stay (HOSPITAL_COMMUNITY): Payer: Medicare Other

## 2018-07-13 DIAGNOSIS — K838 Other specified diseases of biliary tract: Secondary | ICD-10-CM

## 2018-07-13 DIAGNOSIS — I351 Nonrheumatic aortic (valve) insufficiency: Secondary | ICD-10-CM

## 2018-07-13 DIAGNOSIS — E785 Hyperlipidemia, unspecified: Secondary | ICD-10-CM

## 2018-07-13 DIAGNOSIS — J129 Viral pneumonia, unspecified: Secondary | ICD-10-CM

## 2018-07-13 DIAGNOSIS — I10 Essential (primary) hypertension: Secondary | ICD-10-CM

## 2018-07-13 LAB — BRAIN NATRIURETIC PEPTIDE: B Natriuretic Peptide: 247.4 pg/mL — ABNORMAL HIGH (ref 0.0–100.0)

## 2018-07-13 LAB — CBC
HCT: 35.6 % — ABNORMAL LOW (ref 36.0–46.0)
Hemoglobin: 11.4 g/dL — ABNORMAL LOW (ref 12.0–15.0)
MCH: 26.8 pg (ref 26.0–34.0)
MCHC: 32 g/dL (ref 30.0–36.0)
MCV: 83.6 fL (ref 80.0–100.0)
Platelets: 120 10*3/uL — ABNORMAL LOW (ref 150–400)
RBC: 4.26 MIL/uL (ref 3.87–5.11)
RDW: 15.2 % (ref 11.5–15.5)
WBC: 4.6 10*3/uL (ref 4.0–10.5)
nRBC: 0 % (ref 0.0–0.2)

## 2018-07-13 LAB — COMPREHENSIVE METABOLIC PANEL
ALT: 13 U/L (ref 0–44)
AST: 20 U/L (ref 15–41)
Albumin: 3.2 g/dL — ABNORMAL LOW (ref 3.5–5.0)
Alkaline Phosphatase: 73 U/L (ref 38–126)
Anion gap: 9 (ref 5–15)
BUN: 12 mg/dL (ref 8–23)
CALCIUM: 8.8 mg/dL — AB (ref 8.9–10.3)
CO2: 26 mmol/L (ref 22–32)
Chloride: 104 mmol/L (ref 98–111)
Creatinine, Ser: 0.74 mg/dL (ref 0.44–1.00)
GFR calc Af Amer: >60 mL/min (ref >60)
GFR calc non Af Amer: >60 mL/min (ref >60)
Glucose, Bld: 82 mg/dL (ref 70–99)
Potassium: 3.5 mmol/L (ref 3.5–5.1)
Sodium: 139 mmol/L (ref 135–145)
TOTAL PROTEIN: 6 g/dL — AB (ref 6.5–8.1)
Total Bilirubin: 0.8 mg/dL (ref 0.3–1.2)

## 2018-07-13 LAB — ECHOCARDIOGRAM COMPLETE
Height: 65.5 in
Weight: 2235.2 oz

## 2018-07-13 LAB — LACTIC ACID, PLASMA: LACTIC ACID, VENOUS: 0.7 mmol/L (ref 0.5–1.9)

## 2018-07-13 LAB — LIPASE, BLOOD: Lipase: 21 U/L (ref 11–51)

## 2018-07-13 MED ORDER — GABAPENTIN 300 MG PO CAPS: 300.0000 mg | ORAL_CAPSULE | Freq: Three times a day (TID) | ORAL | 0 refills | Status: DC

## 2018-07-13 NOTE — Discharge Summary (Addendum)
Physician Discharge Summary  Bianca Haley TML:[id number] DOB: 09-18-1950 DOA: 07/12/2018  PCP: [name], MD  Admit date: 07/12/2018 Discharge date: 07/13/2018  Admitted From: Independent living facility Disposition: Independently in facility  Recommendations for Outpatient Follow-up and new medication changes:  1. Follow up with Dr. Alain Marion in 7 days.  2. Follow up with GI clinic in 4 to 6 weeks for EUS or ERCP. 3. Pending echocardiogram report. 4. Decreased gabapentin to 300 mg tid to prevent side affects.   Patient had a significant clinical improvement, before the expected time anticipated on admission. She will be discharged before the 2 midnight stay, due to a remarkable not expected rapid improvement.    Home Health: no   Equipment/Devices: no   Discharge Condition: stable  CODE STATUS: full  Diet recommendation: Heart healthy   Brief/Interim Summary: 68 year old female who presented with altered mentation and fever.  She does have the significant past medical history for chronic back pain, arthritis, gastroesophageal reflux disease, hypertension and migraines.  Patient reported diarrhea 4 hours prior to hospitalization, feeling fatigue and malaise, on the day of hospitalization her temperature reached 101 F, she was noted to be altered and disoriented.  On her initial physical examination her temperature was 102 F, her blood pressure was 122/54, heart rate 60, respiratory 14, oxygen saturation 88 to 91%.  She had moist mucous membranes, lungs were clear to auscultation bilaterally, heart S1-S2 present rhythmic, the abdomen was tender in the right upper quadrant without guarding or rebound, no lower extremity edema.  Sodium 136, potassium 4.1, chloride 99, bicarb 27, glucose 95, BUN 21, creatinine 1.16, white cell count 4.5, hemoglobin 12.7, hematocrit 42.3, platelets 160, her urinalysis had 0-5 white cells, 30 protein, specific gravity 1.026.  Respiratory viral panel  was positive for coronavirus 229 E, CT of the abdomen with common bile duct and pancreatic duct dilatation to the region of the ampulla without definite obstructive cause identified.  Positive distended gallbladder with equivocal gallbladder wall thickening and adjacent inflammation.  Chest x-ray with left rotation, no infiltrates, CT chest no pulmonary embolism, faint bilateral groundglass infiltrates.  Her EKG was normal sinus rhythm, normal axis, normal intervals, normal conduction, no significant ST segment or T wave changes.  Patient was admitted to the hospital with a working diagnosis of acute metabolic encephalopathy likely related to coronavirus infection, complicated by acute hypoxic respiratory failure.  1.  Acute metabolic encephalopathy due to coronavirus infection 229E. (Present on admission) (no clinical signs of sepsis).  It was admitted to the hospital, neuro check monitoring, supportive medical therapy with intravenous fluids and empiric IV antibiotics.  She responded well, found to be stable for discharge March 11.  No signs of bacterial infections, antibiotics were discontinued.   2.  Acute hypoxic respiratory failure due to viral pneumonia (present on admission).  Patient received supportive medical therapy, supplemental oxygen per nasal cannula and oximetry monitoring.  No signs of bacterial pneumonia, likely groundglass opacities may represent a viral pneumonia.  At discharge oxygen saturation on room air 99%.  No further antibiotic needed.  3.  Common bile duct and pancreatic duct dilatation.  No clinical signs of cholecystitis, patient underwent further work-up with MRCP, showing normal liver, no focal lesions, gallbladder distention, no gallstones.  Mild intra-and prominent extrahepatic bile duct dilatation.  Diffuse pancreatic ductal dilatation.  Unable to rule out occult ampullary lesion.  Gastroenterology was consulted and patient will have outpatient ERCP versus endoscopic  ultrasound.  4.  Hypertension.  Pressures  been well controlled, continue atenolol.  5.  Chronic back pain.  Continue analgesics, oxymorphone,   6.  Anxiety/depression.  Patient will resume his psychotropic agents. Paroxetine, trazodone, and diazepam.   7. HTN. Continue atenolol, and diltiazem.  8. Dyslipidemia. Continue rosuvastatin.   Discharge Diagnoses:  Active Problems:   Metabolic encephalopathy    Discharge Instructions   Allergies as of 07/13/2018      Reactions   Penicillins Shortness Of Breath, Rash   .Marland KitchenHas patient had a PCN reaction causing immediate rash, facial/tongue/throat swelling, SOB or lightheadedness with hypotension: No Has patient had a PCN reaction causing severe rash involving mucus membranes or skin necrosis: No Has patient had a PCN reaction that required hospitalization No Has patient had a PCN reaction occurring within the last 10 years: No If all of the above answers are "NO", then may proceed with Cephalosporin use.   Morphine And Related Swelling   Sedation/Swelling described like edema/bloating/doesn't help the pain Morphine only; tolerates other opioids.    Cefepime Rash   Reaction while taking both cefepime and vancomycin at [facility] (after 3 days)   Vancomycin Rash   Reaction while taking both cefepime and vancomycin at [facility] (after 3 days)      Medication List    TAKE these medications   acetaminophen 325 MG tablet Commonly known as:  TYLENOL Take 1-2 tablets (325-650 mg total) by mouth every 6 (six) hours as needed for mild pain or moderate pain.   albuterol 108 (90 Base) MCG/ACT inhaler Commonly known as:  PROVENTIL HFA;VENTOLIN HFA Inhale 2 puffs into the lungs every 4 (four) hours as needed for wheezing or shortness of breath.   atenolol 100 MG tablet Commonly known as:  TENORMIN TAKE 1 TABLET(100 MG) BY MOUTH EVERY DAY What changed:    how much to take  how to take this  when to take this  additional instructions   b  complex vitamins tablet Take 1 tablet daily by mouth.   benzonatate 200 MG capsule Commonly known as:  TESSALON One every  8 hours as needed What changed:    how much to take  how to take this  when to take this  reasons to take this  additional instructions   diazepam 5 MG tablet Commonly known as:  VALIUM Take 1 tablet (5 mg total) by mouth as needed for anxiety or muscle spasms.   diltiazem 180 MG 24 hr capsule Commonly known as:  CARDIZEM CD Take 1 capsule (180 mg total) by mouth daily.   gabapentin 300 MG capsule Commonly known as:  NEURONTIN Take 1 capsule (300 mg total) by mouth 3 (three) times daily for 30 days. What changed:  how much to take   multivitamin with minerals Tabs tablet Take 1 tablet by mouth daily.   OVER THE COUNTER MEDICATION Place 1 application into both eyes at bedtime as needed (dry eyes). Over the counter lubricant eye gel   oxymorphone 10 MG tablet Commonly known as:  OPANA Take 10 mg by mouth 2 (two) times daily as needed for pain.   pantoprazole 40 MG tablet Commonly known as:  PROTONIX Take 1 tablet (40 mg total) by mouth 2 (two) times daily before a meal.   PARoxetine 40 MG tablet Commonly known as:  PAXIL Take 1 tablet (40 mg total) by mouth 2 (two) times daily.   polyethylene glycol packet Commonly known as:  MIRALAX / GLYCOLAX Take 17 g by mouth 2 (two) times daily. What changed:  when to take this  reasons to take this  additional instructions   potassium chloride 10 MEQ tablet Commonly known as:  K-DUR TAKE 1 TABLET(10 MEQ) BY MOUTH DAILY What changed:  See the new instructions.   rosuvastatin 5 MG tablet Commonly known as:  CRESTOR Take 1 tablet (5 mg total) by mouth daily.   SUMAtriptan 100 MG tablet Commonly known as:  IMITREX TAKE 1 TABLET BY MOUTH DAILY AS NEEDED FOR MIGRAINE, MAY REPEAT IN 2 HOURS IF HEADACHE PERSISTS OR RECURS What changed:  See the new instructions.   traZODone 150 MG  tablet Commonly known as:  DESYREL Take 1 tablet (150 mg total) by mouth at bedtime.   Vitamin D 50 MCG (2000 UT) tablet Take 2,000 Units by mouth daily.       Allergies  Allergen Reactions  . Penicillins Shortness Of Breath and Rash    .Marland KitchenHas patient had a PCN reaction causing immediate rash, facial/tongue/throat swelling, SOB or lightheadedness with hypotension: No Has patient had a PCN reaction causing severe rash involving mucus membranes or skin necrosis: No Has patient had a PCN reaction that required hospitalization No Has patient had a PCN reaction occurring within the last 10 years: No If all of the above answers are "NO", then may proceed with Cephalosporin use.   [name] Morphine And Related Swelling    Sedation/Swelling described like edema/bloating/doesn't help the pain Morphine only; tolerates other opioids.   . Cefepime Rash    Reaction while taking both cefepime and vancomycin at [facility] (after 3 days)  . Vancomycin Rash    Reaction while taking both cefepime and vancomycin at [facility] (after 3 days)    Consultations:  Gastroenterology    Procedures/Studies: Dg Chest 2 View  Result Date: 07/12/2018 CLINICAL DATA:  Hypoxia and fever.  Cough. EXAM: CHEST - 2 VIEW COMPARISON:  June 29, 2017 FINDINGS: The heart size and mediastinal contours are within normal limits. Both lungs are clear. The visualized skeletal structures are unremarkable. IMPRESSION: No active cardiopulmonary disease. Electronically Signed   By: Dorise Bullion III M.D   On: 07/12/2018 13:43   Ct Angio Chest Pe W And/or Wo Contrast  Result Date: 07/12/2018 CLINICAL DATA:  68 year old female with history of dehydration and fever. EXAM: CT ANGIOGRAPHY CHEST WITH CONTRAST TECHNIQUE: Multidetector CT imaging of the chest was performed using the standard protocol during bolus administration of intravenous contrast. Multiplanar CT image reconstructions and MIPs were obtained to evaluate the vascular anatomy.  CONTRAST:  90mL OMNIPAQUE IOHEXOL 350 MG/ML SOLN COMPARISON:  Chest CT 07/16/2015. FINDINGS: Cardiovascular: There are no filling defects within the pulmonary arterial tree to suggest underlying pulmonary embolism. Heart size is mildly enlarged. There is no significant pericardial fluid, thickening or pericardial calcification. Aortic atherosclerosis. No coronary artery calcifications. Mediastinum/Nodes: No pathologically enlarged mediastinal or hilar lymph nodes. Esophagus is unremarkable in appearance. No axillary lymphadenopathy. Lungs/Pleura: Patchy areas of very subtle ground-glass attenuation are noted throughout the lungs bilaterally, favored to reflect a background of very mild interstitial pulmonary edema. Linear scarring in the inferior segment of the lingula. No acute consolidative airspace disease. No pleural effusions. No definite suspicious appearing pulmonary nodules or masses are noted. Upper Abdomen: Large mass-like area in the region of the upper abdomen extending toward the hepatic hilum, incompletely imaged (best appreciated on axial image 136 of series 6), highly concerning for neoplasm. Musculoskeletal: There are no aggressive appearing lytic or blastic lesions noted in the visualized portions of the skeleton. Review of the MIP  images confirms the above findings. IMPRESSION: 1. No evidence of pulmonary embolism. 2. Mass-like area in the upper abdomen incompletely imaged, concerning for neoplasm. Further evaluation with dedicated CT the abdomen and pelvis with IV contrast is recommended at this time. 3. Mild cardiomegaly with evidence of very mild interstitial pulmonary edema in the lungs. 4. Aortic atherosclerosis. Aortic Atherosclerosis (ICD10-I70.0). Electronically Signed   By: Vinnie Langton M.D.   On: 07/12/2018 15:11   Ct Abdomen Pelvis W Contrast  Result Date: 07/12/2018 CLINICAL DATA:  68 year old female with abdominal pain and indeterminate low-density area in the UPPER abdomen on  recent chest CT. EXAM: CT ABDOMEN AND PELVIS WITH CONTRAST TECHNIQUE: Multidetector CT imaging of the abdomen and pelvis was performed using the standard protocol following bolus administration of intravenous contrast. CONTRAST:  22mL OMNIPAQUE IOHEXOL 300 MG/ML  SOLN COMPARISON:  07/12/2018 chest CT and 08/17/2014 abdominal/pelvic CT. FINDINGS: Lower chest: No acute abnormality Hepatobiliary: The gallbladder is distended with equivocal wall thickening and adjacent inflammation. There is CBD, mild intrahepatic and pancreatic ductal dilatation. CBD measures up to 11 mm with dilatation to the near the ampulla noted without definite obstructing cause. Gallbladder and biliary dilatation accounts for the abnormality on earlier chest CT. No other hepatic abnormalities identified. Pancreas: Pancreatic ductal dilatation noted without definite mass Spleen: Unremarkable Adrenals/Urinary Tract: The kidneys, adrenal glands and bladder are unremarkable. Stomach/Bowel: Stomach is within normal limits. Appendix appears normal. No evidence of bowel wall thickening, distention, or inflammatory changes. Vascular/Lymphatic: Aortic atherosclerosis. No enlarged abdominal or pelvic lymph nodes. Reproductive: Status post hysterectomy. No adnexal masses. Other: No ascites, abscess or pneumoperitoneum. Musculoskeletal: No acute or suspicious bony abnormalities noted. Lumbar spine degenerative changes and facet arthropathy noted. IMPRESSION: 1. CBD and pancreatic ductal dilatation to the region of the ampulla without definite obstructing cause identified. GI consultation and MRCP/ERCP recommended. No pancreatic mass identified. 2. Distended gallbladder equivocal gallbladder wall thickening and adjacent inflammation which may represent acute cholecystitis. Gallbladder and biliary dilatation accounts for the abnormality on chest CT or earlier today. 3.  Aortic Atherosclerosis (ICD10-I70.0). Electronically Signed   By: Margarette Canada M.D.   On:  07/12/2018 16:09   Mr 3d Recon At Scanner  Result Date: 07/12/2018 CLINICAL DATA:  Diarrhea since Saturday. Lethargy and confusion. CT demonstrated bile duct dilatation. EXAM: MRI ABDOMEN WITHOUT AND WITH CONTRAST (INCLUDING MRCP) TECHNIQUE: Multiplanar multisequence MR imaging of the abdomen was performed both before and after the administration of intravenous contrast. Heavily T2-weighted images of the biliary and pancreatic ducts were obtained, and three-dimensional MRCP images were rendered by post processing. CONTRAST:  6 mL Gadavist COMPARISON:  CT abdomen and pelvis 07/12/2018 FINDINGS: Lower chest: Lung bases are clear. Hepatobiliary: Normal homogeneous liver parenchyma. No focal liver lesions. The gallbladder is significantly distended. No gallstones are identified. Mild gallbladder wall thickening could indicate inflammatory process. There is mild intra and prominent extrahepatic bile duct dilatation. No common duct stone or obstructing lesion is identified. Pancreas: Diffuse pancreatic ductal dilatation. No stone or obstructing lesion is identified. No pancreatic parenchymal atrophy or inflammatory changes. Spleen:  Within normal limits in size and appearance. Adrenals/Urinary Tract: No masses identified. No evidence of hydronephrosis. Stomach/Bowel: Visualized portions within the abdomen are unremarkable. Vascular/Lymphatic: No pathologically enlarged lymph nodes identified. No abdominal aortic aneurysm demonstrated. Other:  No free fluid in the abdomen. Musculoskeletal: No suspicious bone lesions identified. IMPRESSION: Diffuse gallbladder distention. Intra and extrahepatic bile duct dilatation. Pancreatic ductal dilatation. No obstructing stone or lesion is identified. Occult ampullary lesion  is not excluded. Possible chronic inflammatory process. Gastroenterology consultation recommended. Correlation with liver function studies and consider radionuclide biliary scan to evaluate for gallbladder  function/biliary obstruction. Electronically Signed   By: Lucienne Capers M.D.   On: 07/12/2018 22:54   Mr Abdomen Mrcp Moise Boring Contast  Result Date: 07/12/2018 CLINICAL DATA:  Diarrhea since Saturday. Lethargy and confusion. CT demonstrated bile duct dilatation. EXAM: MRI ABDOMEN WITHOUT AND WITH CONTRAST (INCLUDING MRCP) TECHNIQUE: Multiplanar multisequence MR imaging of the abdomen was performed both before and after the administration of intravenous contrast. Heavily T2-weighted images of the biliary and pancreatic ducts were obtained, and three-dimensional MRCP images were rendered by post processing. CONTRAST:  6 mL Gadavist COMPARISON:  CT abdomen and pelvis 07/12/2018 FINDINGS: Lower chest: Lung bases are clear. Hepatobiliary: Normal homogeneous liver parenchyma. No focal liver lesions. The gallbladder is significantly distended. No gallstones are identified. Mild gallbladder wall thickening could indicate inflammatory process. There is mild intra and prominent extrahepatic bile duct dilatation. No common duct stone or obstructing lesion is identified. Pancreas: Diffuse pancreatic ductal dilatation. No stone or obstructing lesion is identified. No pancreatic parenchymal atrophy or inflammatory changes. Spleen:  Within normal limits in size and appearance. Adrenals/Urinary Tract: No masses identified. No evidence of hydronephrosis. Stomach/Bowel: Visualized portions within the abdomen are unremarkable. Vascular/Lymphatic: No pathologically enlarged lymph nodes identified. No abdominal aortic aneurysm demonstrated. Other:  No free fluid in the abdomen. Musculoskeletal: No suspicious bone lesions identified. IMPRESSION: Diffuse gallbladder distention. Intra and extrahepatic bile duct dilatation. Pancreatic ductal dilatation. No obstructing stone or lesion is identified. Occult ampullary lesion is not excluded. Possible chronic inflammatory process. Gastroenterology consultation recommended. Correlation with  liver function studies and consider radionuclide biliary scan to evaluate for gallbladder function/biliary obstruction. Electronically Signed   By: Lucienne Capers M.D.   On: 07/12/2018 22:54       Subjective: Patient is feeling better, no nausea or vomiting, no chest pain or dyspnea. Feeling weak.   Discharge Exam: Vitals:   07/13/18 0516 07/13/18 0845  BP: (!) 121/54 (!) 141/67  Pulse: 65 62  Resp: 17 18  Temp: 98.1 F (36.7 C) 97.8 F (36.6 C)  SpO2: 96% 99%   Vitals:   07/12/18 2315 07/13/18 0022 07/13/18 0516 07/13/18 0845  BP: (!) 133/52 (!) 146/71 (!) 121/54 (!) 141/67  Pulse: (!) 59 65 65 62  Resp: 18 18 17 18   Temp:  98.7 F (37.1 C) 98.1 F (36.7 C) 97.8 F (36.6 C)  TempSrc:  Oral Oral Oral  SpO2: (!) 89% 90% 96% 99%  Weight:  63.4 kg    Height:  5' 5.5" (1.664 m)      General: Not in pain or dyspnea Neurology: Awake and alert, non focal  E ENT: mild pallor, no icterus, oral mucosa moist Cardiovascular: No JVD. S1-S2 present, rhythmic, no gallops, rubs, or murmurs. No lower extremity edema. Pulmonary: positive  breath sounds bilaterally, adequate air movement, no wheezing, rhonchi or rales. Gastrointestinal. Abdomen with no organomegaly, non tender, no rebound or guarding Skin. No rashes Musculoskeletal: no joint deformities   The results of significant diagnostics from this hospitalization (including imaging, microbiology, ancillary and laboratory) are listed below for reference.     Microbiology: Recent Results (from the past 240 hour(s))  Blood Culture (routine x 2)     Status: None (Preliminary result)   Collection Time: 07/12/18 12:50 PM  Result Value Ref Range Status   Specimen Description BLOOD LEFT ARM  Final   Special Requests  Final    BOTTLES DRAWN AEROBIC AND ANAEROBIC Blood Culture results may not be optimal due to an excessive volume of blood received in culture bottles   Culture   Final    NO GROWTH < 24 HOURS Performed at [facility] [address] [place], [place] [address]    Report Status PENDING  Incomplete  Urine culture     Status: Abnormal (Preliminary result)   Collection Time: 07/12/18  1:20 PM  Result Value Ref Range Status   Specimen Description URINE, CLEAN CATCH  Final   Special Requests Normal  Final   Culture (A)  Final    30,000 COLONIES/mL PROTEUS MIRABILIS SUSCEPTIBILITIES TO FOLLOW Performed at [facility], [place] [address], [place], [place] [address]    Report Status PENDING  Incomplete  Blood Culture (routine x 2)     Status: None (Preliminary result)   Collection Time: 07/12/18  2:16 PM  Result Value Ref Range Status   Specimen Description BLOOD RIGHT HAND  Final   Special Requests   Final    BOTTLES DRAWN AEROBIC AND ANAEROBIC Blood Culture results may not be optimal due to an excessive volume of blood received in culture bottles   Culture   Final    NO GROWTH < 24 HOURS Performed at [facility], [place] [address], [place], [place] [address]    Report Status PENDING  Incomplete  Respiratory Panel by PCR     Status: Abnormal   Collection Time: 07/12/18  5:09 PM  Result Value Ref Range Status   Adenovirus NOT DETECTED NOT DETECTED Final   Coronavirus 229E DETECTED (A) NOT DETECTED Final    Comment: (NOTE) The Coronavirus on the Respiratory Panel, DOES NOT test for the novel  Coronavirus (2019 nCoV)    Coronavirus HKU1 NOT DETECTED NOT DETECTED Final   Coronavirus NL63 NOT DETECTED NOT DETECTED Final   Coronavirus OC43 NOT DETECTED NOT DETECTED Final   Metapneumovirus NOT DETECTED NOT DETECTED Final   Rhinovirus / Enterovirus NOT DETECTED NOT DETECTED Final   Influenza A NOT DETECTED NOT DETECTED Final   Influenza B NOT DETECTED NOT DETECTED Final   Parainfluenza Virus 1 NOT DETECTED NOT DETECTED Final   Parainfluenza Virus 2 NOT DETECTED NOT DETECTED Final   Parainfluenza Virus 3 NOT DETECTED NOT DETECTED Final   Parainfluenza Virus 4 NOT DETECTED NOT  DETECTED Final   Respiratory Syncytial Virus NOT DETECTED NOT DETECTED Final   Bordetella pertussis NOT DETECTED NOT DETECTED Final   Chlamydophila pneumoniae NOT DETECTED NOT DETECTED Final   Mycoplasma pneumoniae NOT DETECTED NOT DETECTED Final    Comment: Performed at [facility], [address], [place], [place] [address]     Labs: BNP (last 3 results) Recent Labs    07/13/18 0035  BNP 694.8*   Basic Metabolic Panel: Recent Labs  Lab 07/12/18 1233 07/13/18 0035  NA 136 139  K 4.1 3.5  CL 99 104  CO2 27 26  GLUCOSE 95 82  BUN 21 12  CREATININE 1.16* 0.74  CALCIUM 9.1 8.8*   Liver Function Tests: Recent Labs  Lab 07/12/18 1233 07/13/18 0035  AST 23 20  ALT 15 13  ALKPHOS 85 73  BILITOT 0.8 0.8  PROT 6.8 6.0*  ALBUMIN 3.6 3.2*   Recent Labs  Lab 07/13/18 0035  LIPASE 21   No results for input(s): AMMONIA in the last 168 hours. CBC: Recent Labs  Lab 07/12/18 1233 07/13/18  0035  WBC 4.5 4.6  NEUTROABS 3.3  --   HGB 12.7 11.4*  HCT 42.3 35.6*  MCV 86.0 83.6  PLT 160 120*   Cardiac Enzymes: No results for input(s): CKTOTAL, CKMB, CKMBINDEX, TROPONINI in the last 168 hours. BNP: Invalid input(s): POCBNP CBG: Recent Labs  Lab 07/12/18 1201  GLUCAP 88   D-Dimer No results for input(s): DDIMER in the last 72 hours. Hgb A1c No results for input(s): HGBA1C in the last 72 hours. Lipid Profile No results for input(s): CHOL, HDL, LDLCALC, TRIG, CHOLHDL, LDLDIRECT in the last 72 hours. Thyroid function studies No results for input(s): TSH, T4TOTAL, T3FREE, THYROIDAB in the last 72 hours.  Invalid input(s): FREET3 Anemia work up No results for input(s): VITAMINB12, FOLATE, FERRITIN, TIBC, IRON, RETICCTPCT in the last 72 hours. Urinalysis    Component Value Date/Time   COLORURINE AMBER (A) 07/12/2018 1320   APPEARANCEUR HAZY (A) 07/12/2018 1320   LABSPEC 1.026 07/12/2018 1320   PHURINE 5.0 07/12/2018 1320   GLUCOSEU NEGATIVE 07/12/2018  1320   GLUCOSEU NEGATIVE 09/16/2016 1207   HGBUR SMALL (A) 07/12/2018 1320   BILIRUBINUR NEGATIVE 07/12/2018 1320   KETONESUR NEGATIVE 07/12/2018 1320   PROTEINUR 30 (A) 07/12/2018 1320   UROBILINOGEN 0.2 09/16/2016 1207   NITRITE NEGATIVE 07/12/2018 1320   LEUKOCYTESUR NEGATIVE 07/12/2018 1320   Sepsis Labs Invalid input(s): PROCALCITONIN,  WBC,  LACTICIDVEN Microbiology Recent Results (from the past 240 hour(s))  Blood Culture (routine x 2)     Status: None (Preliminary result)   Collection Time: 07/12/18 12:50 PM  Result Value Ref Range Status   Specimen Description BLOOD LEFT ARM  Final   Special Requests   Final    BOTTLES DRAWN AEROBIC AND ANAEROBIC Blood Culture results may not be optimal due to an excessive volume of blood received in culture bottles   Culture   Final    NO GROWTH < 24 HOURS Performed at [facility], [place] [address] [place], [place] [address]    Report Status PENDING  Incomplete  Urine culture     Status: Abnormal (Preliminary result)   Collection Time: 07/12/18  1:20 PM  Result Value Ref Range Status   Specimen Description URINE, CLEAN CATCH  Final   Special Requests Normal  Final   Culture (A)  Final    30,000 COLONIES/mL PROTEUS MIRABILIS SUSCEPTIBILITIES TO FOLLOW Performed at [facility], [place] [address], [place], [place] [address]    Report Status PENDING  Incomplete  Blood Culture (routine x 2)     Status: None (Preliminary result)   Collection Time: 07/12/18  2:16 PM  Result Value Ref Range Status   Specimen Description BLOOD RIGHT HAND  Final   Special Requests   Final    BOTTLES DRAWN AEROBIC AND ANAEROBIC Blood Culture results may not be optimal due to an excessive volume of blood received in culture bottles   Culture   Final    NO GROWTH < 24 HOURS Performed at [facility], [place] [address], [place],  [address]    Report Status PENDING  Incomplete  Respiratory Panel by PCR     Status: Abnormal   Collection  Time: 07/12/18  5:09 PM  Result Value Ref Range Status   Adenovirus NOT DETECTED NOT DETECTED Final   Coronavirus 229E DETECTED (A) NOT DETECTED Final    Comment: (NOTE) The Coronavirus on the Respiratory Panel, DOES NOT test for the novel  Coronavirus (2019 nCoV)  Coronavirus HKU1 NOT DETECTED NOT DETECTED Final   Coronavirus NL63 NOT DETECTED NOT DETECTED Final   Coronavirus OC43 NOT DETECTED NOT DETECTED Final   Metapneumovirus NOT DETECTED NOT DETECTED Final   Rhinovirus / Enterovirus NOT DETECTED NOT DETECTED Final   Influenza A NOT DETECTED NOT DETECTED Final   Influenza B NOT DETECTED NOT DETECTED Final   Parainfluenza Virus 1 NOT DETECTED NOT DETECTED Final   Parainfluenza Virus 2 NOT DETECTED NOT DETECTED Final   Parainfluenza Virus 3 NOT DETECTED NOT DETECTED Final   Parainfluenza Virus 4 NOT DETECTED NOT DETECTED Final   Respiratory Syncytial Virus NOT DETECTED NOT DETECTED Final   Bordetella pertussis NOT DETECTED NOT DETECTED Final   Chlamydophila pneumoniae NOT DETECTED NOT DETECTED Final   Mycoplasma pneumoniae NOT DETECTED NOT DETECTED Final    Comment: Performed at [facility], [place] [address], [place], [place] [address]     Time coordinating discharge: 45 minutes  SIGNED:   Tawni Millers, MD  [facility] 07/13/2018, 12:09 PM

## 2018-07-13 NOTE — Progress Notes (Signed)
Echocardiogram 2D Echocardiogram has been performed.  Bianca Haley 07/13/2018, 10:39 AM

## 2018-07-13 NOTE — Progress Notes (Signed)
Patient is in bed with no complains, she is oriented to the unit and showed how to use the call bell if she needs help. She refuses the bed alarm. She is educated about calling before she gets out of bed. Will continue to monitor.

## 2018-07-13 NOTE — Consult Note (Addendum)
[facility] Consult: 9:44 AM 07/13/2018  LOS: 1 day    Referring Provider: Dr Cathlean Sauer  Primary Care Physician:  Alain Marion [name], MD Primary Gastroenterologist:  Dr. Fuller Plan    Reason for Consultation:  Dilated bile and pancreatic ducts   HPI: Bianca Haley is a 68 y.o. female.   Iron deficient but not anemic 05/2017.   Htn. Kidney stones.  Recurrent PNAs.  Chronic back pain, prn Opana    Endometriosis, uterine fibroids.  Depression, anxiety.  GERD on PPI.  Arthritis.  Previous bladder, spine, knee surgeries and diagnostic laparoscopy (endometriosis) hysterectomy.    Colon polyps dating to 2004.   04/2014 Colonoscopy: 6 mm, sessile, sigmoid polyp (TA without HGD).  Transverse/ascending diverticulosis.  Int rrhoids.   08/2014 CT ab/pelvis w/o contrast: Suggestion of extrahepatic biliary ductal dilatation, and possible pancreatic ductal dilatation, incompletely characterized without contrast. Recommend correlation with LFTs. Nonemergent MRCP and MRI of the pancreas recommended further evaluation. LFTs dating back to 2014 with at most slight AST elevation only in May and October 2018.      Lives at assisted/independent living in [place] where there is an Norvirus outbreak, health dept has placed affected pt's in quarantine.  Pt had 1 or 2 diarrheal stools on Sunday 3/8, this resolved and has not recurred.  Accompanying nausea and anorexia persisted. No abdominal pain. 3/9 notable for fatigue.  Took Valium, Opana in PM 3/9, Opana in PM 3/9.  Confused on awakening 3/10, fever to 101F per spouse.  In past PNA caused AMS.  Brought by spouse to ED.    Fever of 102 F in ED.  On exam fully alert and able to participate in hx gathering.   No AMS or acute problems overnight. WBCs normal.  Hgb 11.4, platelets 120, MCV 83.    resp panel + for Coronovirus 229E (Not Covid 19!!)  Chest/Ab/Pelvis CT with contrast: ? Mass in upper abdomen.  Mild pulm edema and cardiomegaly.  Aortic atherosclerosis.  Dilated PD, intrahepatic ducts and CBD (63mm near ampulla) LFTs, BMET normal.  MRI/MRCP: Distended GB.  Dilated intra/extra hepatic and pancreatic ducts.  No lesions.  ? Chronic inflammatory process.  Consider HIDA scan for eval GB function.  CXR: no PNA   Receiving empiric Levaquin, Flagyl.    Generally patient has a good appetite.  Previous problems with reflux disease, resolved now that she takes PPI twice daily and sleeps on an incline.  Her weight is stable.  She exercises regularly.  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  She and her second husband of 10 years moved to assisted living at [facility] in 03/2018.  Patient does not consume alcohol.  No NSAIDs, they cause hypertension. Family history pertinent for stomach troubles in her father and brother, sounds like GERD.  No history of ulcers or bleeding.  No history of gallbladder, pancreas, bile duct pathologies.   Past Medical History:  Diagnosis Date  . Anemia   . Anxiety   . Arthritis   . Chronic renal failure  patient denies   . GERD (gastroesophageal reflux disease)   . H/O tinnitus    left  . Headache(784.0)   . History of endometriosis   . History of uterine fibroid   . History of UTI   . Hypertension   . Migraine   . Recurrent pneumonia     Past Surgical History:  Procedure Laterality Date  . ABDOMINAL HYSTERECTOMY    . BACK SURGERY    . BLADDER SUSPENSION    . COLONOSCOPY  02/16/2008   normal  . laparoscopy     for evauation of endometriosis  . TOTAL KNEE ARTHROPLASTY Left 07/15/2015   Procedure: TOTAL LEFT KNEE ARTHROPLASTY;  Surgeon: Paralee Cancel, MD;  Location: [facility];  Service: Orthopedics;  Laterality: Left;    Prior to Admission medications   Medication Sig Start Date End Date Taking?  Authorizing Provider  acetaminophen (TYLENOL) 325 MG tablet Take 1-2 tablets (325-650 mg total) by mouth every 6 (six) hours as needed for mild pain or moderate pain. 07/18/15  Yes Babish, Rodman Key, PA-C  albuterol (PROVENTIL HFA;VENTOLIN HFA) 108 (90 Base) MCG/ACT inhaler Inhale 2 puffs into the lungs every 4 (four) hours as needed for wheezing or shortness of breath. 08/07/17  Yes Midge Minium, MD  atenolol (TENORMIN) 100 MG tablet TAKE 1 TABLET(100 MG) BY MOUTH EVERY DAY Patient taking differently: Take 100 mg by mouth daily.  06/09/18  Yes Plotnikov, [name], MD  b complex vitamins tablet Take 1 tablet daily by mouth. 03/12/17  Yes Plotnikov, [name], MD  benzonatate (TESSALON) 200 MG capsule One every  8 hours as needed Patient taking differently: Take 200 mg by mouth 3 (three) times daily as needed for cough.  08/07/17  Yes Midge Minium, MD  Cholecalciferol (VITAMIN D) 2000 units tablet Take 2,000 Units by mouth daily.   Yes [provider]  diazepam (VALIUM) 5 MG tablet Take 1 tablet (5 mg total) by mouth as needed for anxiety or muscle spasms. 06/23/18  Yes Plotnikov, [name], MD  diltiazem (CARDIZEM CD) 180 MG 24 hr capsule Take 1 capsule (180 mg total) by mouth daily. 12/29/17  Yes Plotnikov, [name], MD  gabapentin (NEURONTIN) 300 MG capsule Take 600 capsules by mouth 3 (three) times daily.  01/09/14  Yes [provider]  Multiple Vitamin (MULTIVITAMIN WITH MINERALS) TABS tablet Take 1 tablet by mouth daily.   Yes [provider]  OVER THE COUNTER MEDICATION Place 1 application into both eyes at bedtime as needed (dry eyes). Over the counter lubricant eye gel   Yes [provider]  oxymorphone (OPANA) 10 MG tablet Take 10 mg by mouth 2 (two) times daily as needed for pain.  09/29/13  Yes Samella Parr, NP  pantoprazole (PROTONIX) 40 MG tablet Take 1 tablet (40 mg total) by mouth 2 (two) times daily before a meal. 11/11/17  Yes Plotnikov, [name], MD  PARoxetine (PAXIL) 40 MG tablet Take 1 tablet (40 mg total) by mouth 2 (two) times daily. 10/07/17  Yes Plotnikov, [name], MD  polyethylene glycol (MIRALAX / GLYCOLAX) packet Take 17 g by mouth 2 (two) times daily. Patient taking differently: Take 17 g by mouth daily as needed (constipation). Mix in 8 oz liquid and drink 07/16/15  Yes Babish, Rodman Key, PA-C  potassium chloride (K-DUR) 10 MEQ tablet TAKE 1 TABLET(10 MEQ) BY MOUTH DAILY Patient taking differently: Take 10 mEq by mouth daily.  07/05/18  Yes Plotnikov, [name], MD  rosuvastatin (  CRESTOR) 5 MG tablet Take 1 tablet (5 mg total) by mouth daily. 09/03/17  Yes Plotnikov, [name], MD  SUMAtriptan (IMITREX) 100 MG tablet TAKE 1 TABLET BY MOUTH DAILY AS NEEDED FOR MIGRAINE, MAY REPEAT IN 2 HOURS IF HEADACHE PERSISTS OR RECURS Patient taking differently: Take 100 mg by mouth every 2 (two) hours as needed for migraine.  03/14/18  Yes Burns, Claudina Lick, MD  traZODone (DESYREL) 150 MG tablet Take 1 tablet (150 mg total) by mouth at bedtime. 01/31/18  Yes Plotnikov, [name], MD    Scheduled Meds: . atenolol  100 mg Oral Daily  . cholecalciferol  2,000 Units Oral Daily  . diltiazem  180 mg Oral Daily  . enoxaparin (LOVENOX) injection  40 mg Subcutaneous Q24H  . gabapentin  300 mg Oral TID  . multivitamin with minerals  1 tablet Oral Daily  . pantoprazole  40 mg Oral BID AC  . PARoxetine  40 mg Oral BID  . potassium chloride  10 mEq Oral Daily  . rosuvastatin  5 mg Oral Daily  . traZODone  150 mg Oral QHS   Infusions: . levofloxacin (LEVAQUIN) IV    . metronidazole 500 mg (07/13/18 0513)   PRN Meds: acetaminophen **OR** acetaminophen, albuterol, benzonatate, bisacodyl, fentaNYL (SUBLIMAZE) injection, ondansetron **OR** ondansetron (ZOFRAN) IV, polyethylene glycol   Allergies as of 07/12/2018 - Review Complete 07/12/2018  Allergen Reaction Noted  . Penicillins Shortness Of Breath and Rash   . Morphine and related Swelling  02/14/2011  . Cefepime Rash 11/06/2016  . Vancomycin Rash 11/06/2016    Family History  Problem Relation Age of Onset  . Emphysema Mother   . Heart disease Mother 3       MI    Social History   Socioeconomic History  . Marital status: Married    Spouse name: Not on file  . Number of children: 2  . Years of education: 26  . Highest education level: Not on file  Occupational History  . Occupation: Futures trader  Social Needs  . Financial resource strain: Not on file  . Food insecurity:    Worry: Not on file    Inability: Not on file  . Transportation needs:    Medical: Not on file    Non-medical: Not on file  Tobacco Use  . Smoking status: Never Smoker  . Smokeless tobacco: Never Used  Substance and Sexual Activity  . Alcohol use: No    Alcohol/week: 0.0 standard drinks    Comment: As of 07/2018, it is been several years since she drank any alcohol.  . Drug use: No  . Sexual activity: Yes    Partners: Male  Lifestyle  . Physical activity:    Days per week: Not on file    Minutes per session: Not on file  . Stress: Not on file  Relationships  . Social connections:    Talks on phone: Not on file    Gets together: Not on file    Attends religious service: Not on file    Active member of club or organization: Not on file    Attends meetings of clubs or organizations: Not on file    Relationship status: Not on file  . Intimate partner violence:    Fear of current or ex partner: Not on file    Emotionally abused: Not on file    Physically abused: Not on file    Forced sexual activity: Not on file  Other Topics Concern  .  Not on file  Social History Narrative   HSG, [employer] BS interior design. Married 1977-08-15 - 47 years, her husband died in 08-15-97.  She has been with her current, second husband since around August 15, 2008.  1 dtr - 1982/08/16 - lawyer, 1 son 16-Aug-1987. No h/o abuse.    REVIEW OF SYSTEMS: Constitutional: Per HPI.  Works out at [employer] at least 3 times a week ENT:  No  nose bleeds Pulm: No shortness of breath.  No cough. CV:  No palpitations, no LE edema.  No chest pain GU:  No hematuria, no frequency GI: Per HPI. Heme: No unusual or excessive bleeding or bruising. Transfusions: None. Neuro:  No headaches, no peripheral tingling or numbness.   Derm:  No itching, no rash or sores.  Endocrine:  No sweats or chills.  No polyuria or dysuria Immunization:  Hx reviewed.  Current on flu and multiple other vaccnes Travel:  None beyond local counties in last few months.    PHYSICAL EXAM: Vital signs in last 24 hours: Vitals:   07/13/18 0516 07/13/18 0845  BP: (!) 121/54 (!) 141/67  Pulse: 65 62  Resp: 17 18  Temp: 98.1 F (36.7 C) 97.8 F (36.6 C)  SpO2: 96% 99%   Wt Readings from Last 3 Encounters:  07/13/18 63.4 kg  06/28/18 63.5 kg  06/23/18 63.5 kg    General: Pleasant, well-appearing other than looking tired WF. Head: No facial asymmetry or swelling.  No signs of head trauma. Eyes: No conjunctival pallor.  EOMI.  No icterus. Ears: Not hard of hearing Nose: No congestion or discharge. Mouth: Good dentition.  Tongue midline.  Oral mucosa moist, pink, clear. Neck: No JVD, no masses, no thyromegaly. Lungs: Clear bilaterally Heart: RRR.  2/6 diastolic murmur.  S1, S2 audible. Abdomen: Soft.  Not distended.  Minor RUQ discomfort, "gas" per pt.  Active bowel sounds.  No HSM, masses, bruits, hernias..   Rectal: Deferred Musc/Skeltl: No joint redness, swelling or significant deformities. Extremities: No CCE. Neurologic: Oriented x3.  Excellent historian.  No limb weakness.  No tremors.  No gross deficits. Skin: No rash, no sores, no telangiectasia. Tattoos: None Nodes: No cervical adenopathy. Psych: Does not, cooperative, calm.  Intake/Output from previous day: 03/10 0701 - 03/11 0700 In: 518.5 [I.V.:350; IV Piggyback:168.5] Out: 1300 [Urine:1300] Intake/Output this shift: No intake/output data recorded.  LAB RESULTS: Recent Labs     07/12/18 1233 07/13/18 0035  WBC 4.5 4.6  HGB 12.7 11.4*  HCT 42.3 35.6*  PLT 160 120*   BMET Lab Results  Component Value Date   NA 139 07/13/2018   NA 136 07/12/2018   NA 140 06/23/2018   K 3.5 07/13/2018   K 4.1 07/12/2018   K 4.5 06/23/2018   CL 104 07/13/2018   CL 99 07/12/2018   CL 102 06/23/2018   CO2 26 07/13/2018   CO2 27 07/12/2018   CO2 34 (H) 06/23/2018   GLUCOSE 82 07/13/2018   GLUCOSE 95 07/12/2018   GLUCOSE 85 06/23/2018   BUN 12 07/13/2018   BUN 21 07/12/2018   BUN 14 06/23/2018   CREATININE 0.74 07/13/2018   CREATININE 1.16 (H) 07/12/2018   CREATININE 0.89 06/23/2018   CALCIUM 8.8 (L) 07/13/2018   CALCIUM 9.1 07/12/2018   CALCIUM 9.2 06/23/2018   LFT Recent Labs    07/12/18 1233 07/13/18 0035  PROT 6.8 6.0*  ALBUMIN 3.6 3.2*  AST 23 20  ALT 15 13  ALKPHOS 85 73  BILITOT 0.8  0.8   PT/INR Lab Results  Component Value Date   INR 0.98 07/04/2015   INR 1.0 10/13/2013   INR 1.01 09/27/2013   Lipase     Component Value Date/Time   LIPASE 21 07/13/2018 0035    Drugs of Abuse     Component Value Date/Time   LABOPIA NONE DETECTED 09/27/2013 1504   COCAINSCRNUR NONE DETECTED 09/27/2013 1504   LABBENZ NONE DETECTED 09/27/2013 1504   AMPHETMU NONE DETECTED 09/27/2013 1504   THCU NONE DETECTED 09/27/2013 1504   LABBARB NONE DETECTED 09/27/2013 1504     RADIOLOGY STUDIES: Dg Chest 2 View  Result Date: 07/12/2018 CLINICAL DATA:  Hypoxia and fever.  Cough. EXAM: CHEST - 2 VIEW COMPARISON:  June 29, 2017 FINDINGS: The heart size and mediastinal contours are within normal limits. Both lungs are clear. The visualized skeletal structures are unremarkable. IMPRESSION: No active cardiopulmonary disease. Electronically Signed   By: Dorise Bullion III M.D   On: 07/12/2018 13:43   Ct Angio Chest Pe W And/or Wo Contrast  Result Date: 07/12/2018 CLINICAL DATA:  68 year old female with history of dehydration and fever. EXAM: CT ANGIOGRAPHY CHEST  WITH CONTRAST TECHNIQUE: Multidetector CT imaging of the chest was performed using the standard protocol during bolus administration of intravenous contrast. Multiplanar CT image reconstructions and MIPs were obtained to evaluate the vascular anatomy. CONTRAST:  62mL OMNIPAQUE IOHEXOL 350 MG/ML SOLN COMPARISON:  Chest CT 07/16/2015. FINDINGS: Cardiovascular: There are no filling defects within the pulmonary arterial tree to suggest underlying pulmonary embolism. Heart size is mildly enlarged. There is no significant pericardial fluid, thickening or pericardial calcification. Aortic atherosclerosis. No coronary artery calcifications. Mediastinum/Nodes: No pathologically enlarged mediastinal or hilar lymph nodes. Esophagus is unremarkable in appearance. No axillary lymphadenopathy. Lungs/Pleura: Patchy areas of very subtle ground-glass attenuation are noted throughout the lungs bilaterally, favored to reflect a background of very mild interstitial pulmonary edema. Linear scarring in the inferior segment of the lingula. No acute consolidative airspace disease. No pleural effusions. No definite suspicious appearing pulmonary nodules or masses are noted. Upper Abdomen: Large mass-like area in the region of the upper abdomen extending toward the hepatic hilum, incompletely imaged (best appreciated on axial image 136 of series 6), highly concerning for neoplasm. Musculoskeletal: There are no aggressive appearing lytic or blastic lesions noted in the visualized portions of the skeleton. Review of the MIP images confirms the above findings. IMPRESSION: 1. No evidence of pulmonary embolism. 2. Mass-like area in the upper abdomen incompletely imaged, concerning for neoplasm. Further evaluation with dedicated CT the abdomen and pelvis with IV contrast is recommended at this time. 3. Mild cardiomegaly with evidence of very mild interstitial pulmonary edema in the lungs. 4. Aortic atherosclerosis. Aortic Atherosclerosis  (ICD10-I70.0). Electronically Signed   By: Vinnie Langton M.D.   On: 07/12/2018 15:11   Ct Abdomen Pelvis W Contrast  Result Date: 07/12/2018 CLINICAL DATA:  68 year old female with abdominal pain and indeterminate low-density area in the UPPER abdomen on recent chest CT. EXAM: CT ABDOMEN AND PELVIS WITH CONTRAST TECHNIQUE: Multidetector CT imaging of the abdomen and pelvis was performed using the standard protocol following bolus administration of intravenous contrast. CONTRAST:  59mL OMNIPAQUE IOHEXOL 300 MG/ML  SOLN COMPARISON:  07/12/2018 chest CT and 08/17/2014 abdominal/pelvic CT. FINDINGS: Lower chest: No acute abnormality Hepatobiliary: The gallbladder is distended with equivocal wall thickening and adjacent inflammation. There is CBD, mild intrahepatic and pancreatic ductal dilatation. CBD measures up to 11 mm with dilatation to the near the ampulla  noted without definite obstructing cause. Gallbladder and biliary dilatation accounts for the abnormality on earlier chest CT. No other hepatic abnormalities identified. Pancreas: Pancreatic ductal dilatation noted without definite mass Spleen: Unremarkable Adrenals/Urinary Tract: The kidneys, adrenal glands and bladder are unremarkable. Stomach/Bowel: Stomach is within normal limits. Appendix appears normal. No evidence of bowel wall thickening, distention, or inflammatory changes. Vascular/Lymphatic: Aortic atherosclerosis. No enlarged abdominal or pelvic lymph nodes. Reproductive: Status post hysterectomy. No adnexal masses. Other: No ascites, abscess or pneumoperitoneum. Musculoskeletal: No acute or suspicious bony abnormalities noted. Lumbar spine degenerative changes and facet arthropathy noted. IMPRESSION: 1. CBD and pancreatic ductal dilatation to the region of the ampulla without definite obstructing cause identified. GI consultation and MRCP/ERCP recommended. No pancreatic mass identified. 2. gallbladder and biliary dilatation accounts for the  abnormality on chest CT or earlier today. 3.  Aortic Atherosclerosis (ICD10-I70.0). Electronically Signed   By: Margarette Canada M.D.   On: 07/12/2018 16:09   Mr 3d Recon At Scanner Mr Abdomen Mrcp Moise Boring Contast  Result Date: 07/12/2018 CLINICAL DATA:  Diarrhea since Saturday. Lethargy and confusion. CT demonstrated bile duct dilatation. EXAM: MRI ABDOMEN WITHOUT AND WITH CONTRAST (INCLUDING MRCP) TECHNIQUE: Multiplanar multisequence MR imaging of the abdomen was performed both before and after the administration of intravenous contrast. Heavily T2-weighted images of the biliary and pancreatic ducts were obtained, and three-dimensional MRCP images were rendered by post processing. CONTRAST:  6 mL Gadavist COMPARISON:  CT abdomen and pelvis 07/12/2018 FINDINGS: Lower chest: Lung bases are clear. Hepatobiliary: Normal homogeneous liver parenchyma. No focal liver lesions. The gallbladder is significantly distended. No gallstones are identified. Mild gallbladder wall thickening could indicate inflammatory process. There is mild intra and prominent extrahepatic bile duct dilatation. No common duct stone or obstructing lesion is identified. Pancreas: Diffuse pancreatic ductal dilatation. No stone or obstructing lesion is identified. No pancreatic parenchymal atrophy or inflammatory changes. Spleen:  Within normal limits in size and appearance. Adrenals/Urinary Tract: No masses identified. No evidence of hydronephrosis. Stomach/Bowel: Visualized portions within the abdomen are unremarkable. Vascular/Lymphatic: No pathologically enlarged lymph nodes identified. No abdominal aortic aneurysm demonstrated. Other:  No free fluid in the abdomen. Musculoskeletal: No suspicious bone lesions identified. IMPRESSION: Diffuse gallbladder distention. Intra and extrahepatic bile duct dilatation. Pancreatic ductal dilatation. No obstructing stone or lesion is identified. Occult ampullary lesion is not excluded. Possible chronic  inflammatory process. Gastroenterology consultation recommended. Correlation with liver function studies and consider radionuclide biliary scan to evaluate for gallbladder function/biliary obstruction. Electronically Signed   By: Lucienne Capers M.D.   On: 07/12/2018 22:54     IMPRESSION:   *   Dilated biliary ducts and PD without obvious cause, changes date back to non-con CT in 2016.  MRCP suggest GB distention, chronic inflammatory process, yet LFTs normal and she has no abd pain or chronic GI sxs.      *   Fever.  Self limited diarrhea 4 d ago, nausea, anorexia and malaise since.   Coronovirus 229E detected on resp panel.  No active pulmonary dz on CT or xray and no active resp sxs.  On airborne and contact precautions.      *   AMS.  Resolved.  No brain imaging pursued.    *   Chronic pain, anxiety.  On anxiolytics and opiates.       PLAN:     *   Case d/w Dr Rush Landmark.  He would pursue outpt EUS/ERCP in next 4 to 6 weeks.  Pt can return  to GI office in meantime vs go directly to EUS.  Her primary GI is Dr Fuller Plan.      Azucena Freed  07/13/2018, 9:44 AM Phone [phone]

## 2018-07-13 NOTE — ED Notes (Signed)
ED TO INPATIENT HANDOFF REPORT  ED Nurse Name and Phone #: Daralene Milch, RN [phone]  S Name/Age/Gender Bianca Haley 68 y.o. female Room/Bed: 047C/047C  Code Status   Code Status: Full Code  Home/SNF/Other Home Patient oriented to: Pt oriented x's 4 Is this baseline? Yes   Triage Complete: Triage complete  Chief Complaint stomach bug/hypoxia   Triage Note Pt arrives via [facility] from [place]. Pt endorses diarrhea since Saturday (EMS states bug going around facility). On EMS arrival pt was more lethargic/confused than baseline starting upon awakening. Husband endorses similar presentation in the past with dehydration and fever. Pt was initially 78% on RA at rest. Pt placed on 4L [place] and up to 98%. Pt reports she does not feel as confused. Pt alert and oriented x4. Hx of a fib, took diltiazem today.   Allergies Allergies  Allergen Reactions  . Penicillins Shortness Of Breath and Rash    .Marland KitchenHas patient had a PCN reaction causing immediate rash, facial/tongue/throat swelling, SOB or lightheadedness with hypotension: No Has patient had a PCN reaction causing severe rash involving mucus membranes or skin necrosis: No Has patient had a PCN reaction that required hospitalization No Has patient had a PCN reaction occurring within the last 10 years: No If all of the above answers are "NO", then may proceed with Cephalosporin use.   [name] Morphine And Related Swelling    Sedation/Swelling described like edema/bloating/doesn't help the pain Morphine only; tolerates other opioids.   . Cefepime Rash    Reaction while taking both cefepime and vancomycin at [facility] (after 3 days)  . Vancomycin Rash    Reaction while taking both cefepime and vancomycin at [facility] (after 3 days)    Level of Care/Admitting Diagnosis ED Disposition    ED Disposition Condition [facility] Area: [place] [[id number]]  Level of Care: Medical Telemetry [104]  Diagnosis:  Metabolic encephalopathy [086.76.ICD-9-CM]  Admitting Physician: Guilford Shi [[id number]]  Attending Physician: Guilford Shi [[id number]]  Estimated length of stay: past midnight tomorrow  Certification:: I certify this patient will need inpatient services for at least 2 midnights  PT Class (Do Not Modify): Inpatient [101]  PT Acc Code (Do Not Modify): Private [1]       B Medical/Surgery History Past Medical History:  Diagnosis Date  . Anemia   . Anxiety   . Arthritis   . Back pain   . Chronic renal failure    patient denies   . GERD (gastroesophageal reflux disease)   . H/O tinnitus    left  . Headache(784.0)   . History of endometriosis   . History of uterine fibroid   . History of UTI   . Hypertension   . Migraine   . Recurrent pneumonia    Past Surgical History:  Procedure Laterality Date  . ABDOMINAL HYSTERECTOMY    . BACK SURGERY    . BLADDER SUSPENSION    . COLONOSCOPY  02/16/2008   normal  . laparoscopy     for evauation of endometriosis  . TOTAL KNEE ARTHROPLASTY Left 07/15/2015   Procedure: TOTAL LEFT KNEE ARTHROPLASTY;  Surgeon: Paralee Cancel, MD;  Location: [facility];  Service: Orthopedics;  Laterality: Left;     A IV Location/Drains/Wounds Patient Lines/Drains/Airways Status   Active Line/Drains/Airways    Name:   Placement date:   Placement time:   Site:   Days:   Peripheral IV 07/12/18 Left Antecubital   07/12/18    1157  Antecubital   less than 1          Intake/Output Last 24 hours  Intake/Output Summary (Last 24 hours) at 07/12/2018 2355 Last data filed at 07/12/2018 1458 Gross per 24 hour  Intake 450 ml  Output -  Net 450 ml    Labs/Imaging Results for orders placed or performed during the hospital encounter of 07/12/18 (from the past 48 hour(s))  CBG monitoring, ED     Status: None   Collection Time: 07/12/18 12:01 PM  Result Value Ref Range   Glucose-Capillary 88 70 - 99 mg/dL  Lactic acid, plasma     Status: None    Collection Time: 07/12/18 12:33 PM  Result Value Ref Range   Lactic Acid, Venous 0.6 0.5 - 1.9 mmol/L    Comment: Performed at [facility], [place] [address], [place], [place] [address]  Comprehensive metabolic panel     Status: Abnormal   Collection Time: 07/12/18 12:33 PM  Result Value Ref Range   Sodium 136 135 - 145 mmol/L   Potassium 4.1 3.5 - 5.1 mmol/L   Chloride 99 98 - 111 mmol/L   CO2 27 22 - 32 mmol/L   Glucose, Bld 95 70 - 99 mg/dL   BUN 21 8 - 23 mg/dL   Creatinine, Ser 1.16 (H) 0.44 - 1.00 mg/dL   Calcium 9.1 8.9 - 10.3 mg/dL   Total Protein 6.8 6.5 - 8.1 g/dL   Albumin 3.6 3.5 - 5.0 g/dL   AST 23 15 - 41 U/L   ALT 15 0 - 44 U/L   Alkaline Phosphatase 85 38 - 126 U/L   Total Bilirubin 0.8 0.3 - 1.2 mg/dL   GFR calc non Af Amer 48 (L) >60 mL/min   GFR calc Af Amer 56 (L) >60 mL/min   Anion gap 10 5 - 15    Comment: Performed at [facility] [address], [place], [place] [address]  CBC WITH DIFFERENTIAL     Status: Abnormal   Collection Time: 07/12/18 12:33 PM  Result Value Ref Range   WBC 4.5 4.0 - 10.5 K/uL   RBC 4.92 3.87 - 5.11 MIL/uL   Hemoglobin 12.7 12.0 - 15.0 g/dL   HCT 42.3 36.0 - 46.0 %   MCV 86.0 80.0 - 100.0 fL   MCH 25.8 (L) 26.0 - 34.0 pg   MCHC 30.0 30.0 - 36.0 g/dL   RDW 15.4 11.5 - 15.5 %   Platelets 160 150 - 400 K/uL   nRBC 0.0 0.0 - 0.2 %   Neutrophils Relative % 73 %   Neutro Abs 3.3 1.7 - 7.7 K/uL   Lymphocytes Relative 19 %   Lymphs Abs 0.8 0.7 - 4.0 K/uL   Monocytes Relative 7 %   Monocytes Absolute 0.3 0.1 - 1.0 K/uL   Eosinophils Relative 1 %   Eosinophils Absolute 0.1 0.0 - 0.5 K/uL   Basophils Relative 0 %   Basophils Absolute 0.0 0.0 - 0.1 K/uL   WBC Morphology See Note     Comment: >10% reactive, Benign Lymphocytes.   Immature Granulocytes 0 %   Abs Immature Granulocytes 0.01 0.00 - 0.07 K/uL    Comment: Performed at [facility], [place] [address] [place], [place] [address]  Urinalysis, Routine w reflex  microscopic     Status: Abnormal   Collection Time: 07/12/18  1:20 PM  Result Value Ref Range   Color, Urine AMBER (A) YELLOW    Comment:  BIOCHEMICALS MAY BE AFFECTED BY COLOR   APPearance HAZY (A) CLEAR   Specific Gravity, Urine 1.026 1.005 - 1.030   pH 5.0 5.0 - 8.0   Glucose, UA NEGATIVE NEGATIVE mg/dL   Hgb urine dipstick SMALL (A) NEGATIVE   Bilirubin Urine NEGATIVE NEGATIVE   Ketones, ur NEGATIVE NEGATIVE mg/dL   Protein, ur 30 (A) NEGATIVE mg/dL   Nitrite NEGATIVE NEGATIVE   Leukocytes,Ua NEGATIVE NEGATIVE   RBC / HPF 6-10 0 - 5 RBC/hpf   WBC, UA 0-5 0 - 5 WBC/hpf   Bacteria, UA RARE (A) NONE SEEN   Squamous Epithelial / LPF 0-5 0 - 5   Mucus PRESENT    Hyaline Casts, UA PRESENT     Comment: Performed at [facility] [address] [place], [place] [address]  Respiratory Panel by PCR     Status: Abnormal   Collection Time: 07/12/18  5:09 PM  Result Value Ref Range   Adenovirus NOT DETECTED NOT DETECTED   Coronavirus 229E DETECTED (A) NOT DETECTED    Comment: (NOTE) The Coronavirus on the Respiratory Panel, DOES NOT test for the novel  Coronavirus (2019 nCoV)    Coronavirus HKU1 NOT DETECTED NOT DETECTED   Coronavirus NL63 NOT DETECTED NOT DETECTED   Coronavirus OC43 NOT DETECTED NOT DETECTED   Metapneumovirus NOT DETECTED NOT DETECTED   Rhinovirus / Enterovirus NOT DETECTED NOT DETECTED   Influenza A NOT DETECTED NOT DETECTED   Influenza B NOT DETECTED NOT DETECTED   Parainfluenza Virus 1 NOT DETECTED NOT DETECTED   Parainfluenza Virus 2 NOT DETECTED NOT DETECTED   Parainfluenza Virus 3 NOT DETECTED NOT DETECTED   Parainfluenza Virus 4 NOT DETECTED NOT DETECTED   Respiratory Syncytial Virus NOT DETECTED NOT DETECTED   Bordetella pertussis NOT DETECTED NOT DETECTED   Chlamydophila pneumoniae NOT DETECTED NOT DETECTED   Mycoplasma pneumoniae NOT DETECTED NOT DETECTED    Comment: Performed at [facility], [place] [address], [place], [place] [address]    Dg Chest 2 View  Result Date: 07/12/2018 CLINICAL DATA:  Hypoxia and fever.  Cough. EXAM: CHEST - 2 VIEW COMPARISON:  June 29, 2017 FINDINGS: The heart size and mediastinal contours are within normal limits. Both lungs are clear. The visualized skeletal structures are unremarkable. IMPRESSION: No active cardiopulmonary disease. Electronically Signed   By: Dorise Bullion III M.D   On: 07/12/2018 13:43   Ct Angio Chest Pe W And/or Wo Contrast  Result Date: 07/12/2018 CLINICAL DATA:  68 year old female with history of dehydration and fever. EXAM: CT ANGIOGRAPHY CHEST WITH CONTRAST TECHNIQUE: Multidetector CT imaging of the chest was performed using the standard protocol during bolus administration of intravenous contrast. Multiplanar CT image reconstructions and MIPs were obtained to evaluate the vascular anatomy. CONTRAST:  82mL OMNIPAQUE IOHEXOL 350 MG/ML SOLN COMPARISON:  Chest CT 07/16/2015. FINDINGS: Cardiovascular: There are no filling defects within the pulmonary arterial tree to suggest underlying pulmonary embolism. Heart size is mildly enlarged. There is no significant pericardial fluid, thickening or pericardial calcification. Aortic atherosclerosis. No coronary artery calcifications. Mediastinum/Nodes: No pathologically enlarged mediastinal or hilar lymph nodes. Esophagus is unremarkable in appearance. No axillary lymphadenopathy. Lungs/Pleura: Patchy areas of very subtle ground-glass attenuation are noted throughout the lungs bilaterally, favored to reflect a background of very mild interstitial pulmonary edema. Linear scarring in the inferior segment of the lingula. No acute consolidative airspace disease. No pleural effusions. No definite suspicious appearing pulmonary nodules or masses are noted. Upper Abdomen: Large mass-like  area in the region of the upper abdomen extending toward the hepatic hilum, incompletely imaged (best appreciated on axial image 136 of series 6), highly concerning  for neoplasm. Musculoskeletal: There are no aggressive appearing lytic or blastic lesions noted in the visualized portions of the skeleton. Review of the MIP images confirms the above findings. IMPRESSION: 1. No evidence of pulmonary embolism. 2. Mass-like area in the upper abdomen incompletely imaged, concerning for neoplasm. Further evaluation with dedicated CT the abdomen and pelvis with IV contrast is recommended at this time. 3. Mild cardiomegaly with evidence of very mild interstitial pulmonary edema in the lungs. 4. Aortic atherosclerosis. Aortic Atherosclerosis (ICD10-I70.0). Electronically Signed   By: Vinnie Langton M.D.   On: 07/12/2018 15:11   Ct Abdomen Pelvis W Contrast  Result Date: 07/12/2018 CLINICAL DATA:  68 year old female with abdominal pain and indeterminate low-density area in the UPPER abdomen on recent chest CT. EXAM: CT ABDOMEN AND PELVIS WITH CONTRAST TECHNIQUE: Multidetector CT imaging of the abdomen and pelvis was performed using the standard protocol following bolus administration of intravenous contrast. CONTRAST:  13mL OMNIPAQUE IOHEXOL 300 MG/ML  SOLN COMPARISON:  07/12/2018 chest CT and 08/17/2014 abdominal/pelvic CT. FINDINGS: Lower chest: No acute abnormality Hepatobiliary: The gallbladder is distended with equivocal wall thickening and adjacent inflammation. There is CBD, mild intrahepatic and pancreatic ductal dilatation. CBD measures up to 11 mm with dilatation to the near the ampulla noted without definite obstructing cause. Gallbladder and biliary dilatation accounts for the abnormality on earlier chest CT. No other hepatic abnormalities identified. Pancreas: Pancreatic ductal dilatation noted without definite mass Spleen: Unremarkable Adrenals/Urinary Tract: The kidneys, adrenal glands and bladder are unremarkable. Stomach/Bowel: Stomach is within normal limits. Appendix appears normal. No evidence of bowel wall thickening, distention, or inflammatory changes.  Vascular/Lymphatic: Aortic atherosclerosis. No enlarged abdominal or pelvic lymph nodes. Reproductive: Status post hysterectomy. No adnexal masses. Other: No ascites, abscess or pneumoperitoneum. Musculoskeletal: No acute or suspicious bony abnormalities noted. Lumbar spine degenerative changes and facet arthropathy noted. IMPRESSION: 1. CBD and pancreatic ductal dilatation to the region of the ampulla without definite obstructing cause identified. GI consultation and MRCP/ERCP recommended. No pancreatic mass identified. 2. Distended gallbladder equivocal gallbladder wall thickening and adjacent inflammation which may represent acute cholecystitis. Gallbladder and biliary dilatation accounts for the abnormality on chest CT or earlier today. 3.  Aortic Atherosclerosis (ICD10-I70.0). Electronically Signed   By: Margarette Canada M.D.   On: 07/12/2018 16:09   Mr 3d Recon At Scanner  Result Date: 07/12/2018 CLINICAL DATA:  Diarrhea since Saturday. Lethargy and confusion. CT demonstrated bile duct dilatation. EXAM: MRI ABDOMEN WITHOUT AND WITH CONTRAST (INCLUDING MRCP) TECHNIQUE: Multiplanar multisequence MR imaging of the abdomen was performed both before and after the administration of intravenous contrast. Heavily T2-weighted images of the biliary and pancreatic ducts were obtained, and three-dimensional MRCP images were rendered by post processing. CONTRAST:  6 mL Gadavist COMPARISON:  CT abdomen and pelvis 07/12/2018 FINDINGS: Lower chest: Lung bases are clear. Hepatobiliary: Normal homogeneous liver parenchyma. No focal liver lesions. The gallbladder is significantly distended. No gallstones are identified. Mild gallbladder wall thickening could indicate inflammatory process. There is mild intra and prominent extrahepatic bile duct dilatation. No common duct stone or obstructing lesion is identified. Pancreas: Diffuse pancreatic ductal dilatation. No stone or obstructing lesion is identified. No pancreatic  parenchymal atrophy or inflammatory changes. Spleen:  Within normal limits in size and appearance. Adrenals/Urinary Tract: No masses identified. No evidence of hydronephrosis. Stomach/Bowel: Visualized portions within the abdomen  are unremarkable. Vascular/Lymphatic: No pathologically enlarged lymph nodes identified. No abdominal aortic aneurysm demonstrated. Other:  No free fluid in the abdomen. Musculoskeletal: No suspicious bone lesions identified. IMPRESSION: Diffuse gallbladder distention. Intra and extrahepatic bile duct dilatation. Pancreatic ductal dilatation. No obstructing stone or lesion is identified. Occult ampullary lesion is not excluded. Possible chronic inflammatory process. Gastroenterology consultation recommended. Correlation with liver function studies and consider radionuclide biliary scan to evaluate for gallbladder function/biliary obstruction. Electronically Signed   By: Lucienne Capers M.D.   On: 07/12/2018 22:54   Mr Abdomen Mrcp Moise Boring Contast  Result Date: 07/12/2018 CLINICAL DATA:  Diarrhea since Saturday. Lethargy and confusion. CT demonstrated bile duct dilatation. EXAM: MRI ABDOMEN WITHOUT AND WITH CONTRAST (INCLUDING MRCP) TECHNIQUE: Multiplanar multisequence MR imaging of the abdomen was performed both before and after the administration of intravenous contrast. Heavily T2-weighted images of the biliary and pancreatic ducts were obtained, and three-dimensional MRCP images were rendered by post processing. CONTRAST:  6 mL Gadavist COMPARISON:  CT abdomen and pelvis 07/12/2018 FINDINGS: Lower chest: Lung bases are clear. Hepatobiliary: Normal homogeneous liver parenchyma. No focal liver lesions. The gallbladder is significantly distended. No gallstones are identified. Mild gallbladder wall thickening could indicate inflammatory process. There is mild intra and prominent extrahepatic bile duct dilatation. No common duct stone or obstructing lesion is identified. Pancreas: Diffuse  pancreatic ductal dilatation. No stone or obstructing lesion is identified. No pancreatic parenchymal atrophy or inflammatory changes. Spleen:  Within normal limits in size and appearance. Adrenals/Urinary Tract: No masses identified. No evidence of hydronephrosis. Stomach/Bowel: Visualized portions within the abdomen are unremarkable. Vascular/Lymphatic: No pathologically enlarged lymph nodes identified. No abdominal aortic aneurysm demonstrated. Other:  No free fluid in the abdomen. Musculoskeletal: No suspicious bone lesions identified. IMPRESSION: Diffuse gallbladder distention. Intra and extrahepatic bile duct dilatation. Pancreatic ductal dilatation. No obstructing stone or lesion is identified. Occult ampullary lesion is not excluded. Possible chronic inflammatory process. Gastroenterology consultation recommended. Correlation with liver function studies and consider radionuclide biliary scan to evaluate for gallbladder function/biliary obstruction. Electronically Signed   By: Lucienne Capers M.D.   On: 07/12/2018 22:54    Pending Labs Unresulted Labs (From admission, onward)    Start     Ordered   07/19/18 0500  Creatinine, serum  (enoxaparin (LOVENOX)    CrCl >/= 30 ml/min)  Weekly,   R    Comments:  while on enoxaparin therapy    07/12/18 1949   07/13/18 0500  Comprehensive metabolic panel  Tomorrow morning,   R     07/12/18 1949   07/13/18 0500  CBC  Tomorrow morning,   R     07/12/18 1949   07/12/18 2218  Legionella Pneumophila Serogp 1 Ur Ag  Once,   R     07/12/18 2217   07/12/18 2218  Strep pneumoniae urinary antigen  Once,   R     07/12/18 2217   07/12/18 2026  Brain natriuretic peptide  Once,   R     07/12/18 2025   07/12/18 1946  HIV antibody (Routine Testing)  Once,   R     07/12/18 1949   07/12/18 1946  CBC  (enoxaparin (LOVENOX)    CrCl >/= 30 ml/min)  Once,   R    Comments:  Baseline for enoxaparin therapy IF NOT ALREADY DRAWN.  Notify MD if PLT < 100 K.    07/12/18  1949   07/12/18 1946  Creatinine, serum  (enoxaparin (LOVENOX)    CrCl >/=  30 ml/min)  Once,   R    Comments:  Baseline for enoxaparin therapy IF NOT ALREADY DRAWN.    07/12/18 1949   07/12/18 1617  Lipase, blood  ONCE - STAT,   STAT     07/12/18 1616   07/12/18 1233  Lactic acid, plasma  Now then every 2 hours,   STAT     07/12/18 1233   07/12/18 1233  Blood Culture (routine x 2)  BLOOD CULTURE X 2,   STAT    Question:  Patient immune status  Answer:  Normal   07/12/18 1233   07/12/18 1233  Urine culture  ONCE - STAT,   STAT    Question:  Patient immune status  Answer:  Normal   07/12/18 1233          Vitals/Pain Today's Vitals   07/12/18 2015 07/12/18 2017 07/12/18 2030 07/12/18 2100  BP: (!) 147/51  (!) 141/52 (!) 142/68  Pulse: (!) 59  (!) 58 60  Resp: 20  (!) 21 (!) 22  Temp:      TempSrc:      SpO2: 93%  90% 91%  PainSc:  0-No pain      Isolation Precautions Droplet and Contact precautions  Medications Medications  levofloxacin (LEVAQUIN) IVPB 500 mg (has no administration in time range)  metroNIDAZOLE (FLAGYL) IVPB 500 mg (0 mg Intravenous Stopped 07/12/18 2325)  fentaNYL (SUBLIMAZE) injection 25 mcg (has no administration in time range)  atenolol (TENORMIN) tablet 100 mg (has no administration in time range)  diltiazem (CARDIZEM CD) 24 hr capsule 180 mg (has no administration in time range)  rosuvastatin (CRESTOR) tablet 5 mg (has no administration in time range)  traZODone (DESYREL) tablet 150 mg (has no administration in time range)  PARoxetine (PAXIL) tablet 40 mg (has no administration in time range)  pantoprazole (PROTONIX) EC tablet 40 mg (has no administration in time range)  polyethylene glycol (MIRALAX / GLYCOLAX) packet 17 g (has no administration in time range)  gabapentin (NEURONTIN) capsule 300 mg (has no administration in time range)  cholecalciferol (VITAMIN D3) tablet 2,000 Units (has no administration in time range)  multivitamin with minerals  tablet 1 tablet (has no administration in time range)  potassium chloride (K-DUR) CR tablet 10 mEq (has no administration in time range)  albuterol (PROVENTIL) (2.5 MG/3ML) 0.083% nebulizer solution 2.5 mg (has no administration in time range)  benzonatate (TESSALON) capsule 200 mg (has no administration in time range)  enoxaparin (LOVENOX) injection 40 mg (has no administration in time range)  acetaminophen (TYLENOL) tablet 650 mg (has no administration in time range)    Or  acetaminophen (TYLENOL) suppository 650 mg (has no administration in time range)  ondansetron (ZOFRAN) tablet 4 mg (has no administration in time range)    Or  ondansetron (ZOFRAN) injection 4 mg (has no administration in time range)  bisacodyl (DULCOLAX) EC tablet 5 mg (has no administration in time range)  lactated ringers bolus 1,000 mL (0 mLs Intravenous Stopped 07/12/18 1516)    And  lactated ringers bolus 1,000 mL (0 mLs Intravenous Stopped 07/12/18 1817)  acetaminophen (TYLENOL) tablet 650 mg (650 mg Oral Given 07/12/18 1302)  cefTRIAXone (ROCEPHIN) 1 g in sodium chloride 0.9 % 100 mL IVPB (0 g Intravenous Stopped 07/12/18 1458)  azithromycin (ZITHROMAX) 500 mg in sodium chloride 0.9 % 250 mL IVPB (0 mg Intravenous Stopped 07/12/18 1516)  iohexol (OMNIPAQUE) 350 MG/ML injection 60 mL (60 mLs Intravenous Contrast Given 07/12/18 1441)  iohexol (  OMNIPAQUE) 300 MG/ML solution 80 mL (80 mLs Intravenous Contrast Given 07/12/18 1544)  gadobutrol (GADAVIST) 1 MMOL/ML injection 6 mL (6 mLs Intravenous Contrast Given 07/12/18 2219)    Mobility walks     Focused Assessments Pulmonary Assessment Handoff:  Lung sounds:   O2 Device: Nasal Cannula O2 Flow Rate (L/min): 4 L/min      R Recommendations: See Admitting Provider Note  Report given to:   Additional Notes

## 2018-07-14 ENCOUNTER — Ambulatory Visit: Payer: Medicare Other | Admitting: Pulmonary Disease

## 2018-07-14 ENCOUNTER — Telehealth: Payer: Self-pay | Admitting: *Deleted

## 2018-07-14 ENCOUNTER — Other Ambulatory Visit: Payer: Self-pay

## 2018-07-14 LAB — URINE CULTURE: Special Requests: NORMAL

## 2018-07-14 LAB — LEGIONELLA PNEUMOPHILA SEROGP 1 UR AG: L. pneumophila Serogp 1 Ur Ag: NEGATIVE

## 2018-07-14 NOTE — Telephone Encounter (Signed)
Transition Care Management Follow-up Telephone Call   Date discharged? 07/13/18   How have you been since you were released from the hospital? Pt states she is doing ok   Do you understand why you were in the hospital? YES   Do you understand the discharge instructions? YES   Where were you discharged to? Home   Items Reviewed:  Medications reviewed: YES  Allergies reviewed: YES  Dietary changes reviewed: YES, heart healthy  Referrals reviewed: YES, she states will see  GI 1st week of April   Functional Questionnaire:   Activities of Daily Living (ADLs):   She states she are independent in the following: ambulation, bathing and hygiene, feeding, continence, grooming, toileting and dressing States she doesn't require assistance    Any transportation issues/concerns?: NO   Any patient concerns? NO   Confirmed importance and date/time of follow-up visits scheduled YES, appt 07/19/18  Provider Appointment booked with Dr. Alain Marion  Confirmed with patient if condition begins to worsen call PCP or go to the ER.  Patient was given the office number and encouraged to call back with question or concerns.  : YES

## 2018-07-14 NOTE — Consult Note (Signed)
Patient was high risk score for unplanned readmissions. Assigned to General EMMI follow up.  Natividad Brood, RN BSN [facility] Liaison  [phone] business mobile phone Toll free office [phone]

## 2018-07-17 ENCOUNTER — Encounter: Payer: Self-pay | Admitting: Psychology

## 2018-07-17 LAB — CULTURE, BLOOD (ROUTINE X 2)
Culture: NO GROWTH
Culture: NO GROWTH

## 2018-07-17 NOTE — Progress Notes (Signed)
Neuropsychological Consultation   Patient:   Bianca Haley   DOB:   19-Sep-1950  MR Number:  [id number]  Location:  [facility] [place], [place] [id number] [place] [address] Dept: [phone]           Date of Service:   06/30/2018  Start Time:   4 PM End Time:   5 PM  Provider/Observer:  Ilean Skill, Psy.D.       Clinical Neuropsychologist       Billing Code/Service: 01751 4 Units  Chief Complaint:    Bianca Haley is a 68 year old female referred by Dr. Rexene Alberts for neuropsychological evaluation due to diagnostic concerns related to her experiencing increasing short-term memory issues.    Reason for Service:  Bianca Haley is a 68 year old female referred by Dr. Rexene Alberts for neuropsychological evaluation due to diagnostic concerns related to her experiencing increasing short-term memory issues.    The patient reports that her daughter was pushing for this evaluation as a daughter is told the patient she has seen significant worsening of short-term memory loss over the past year.  The patient reports that she has begun using calendars for appointments due to some changes in memory.  The patient reports that she has just moved to a graduated care facility not due to issues per se but to have the availability of increasing care over the years.  She reports that her husband is having cognitive difficulties.  The patient denies any geographic disorientation and denies any observed changes in expressive language.  The patient reports that her mother passed away at 22 and had some memory loss due to hydrocephalus and was treated with a shunt.  The patient recently had an MRI of her brain done suggesting mild parasylvian atrophy and mild chronic small vessel ischemic disease.  Current Status:  The patient reports that she has noted little change in her short-term memory but  greater concerns are being reported to her by her daughter.  The patient denies any geographic disorientation or changes in expressive language.  Reliability of Information: The information is derived from 1 hour face-to-face clinical interview with the patient as well as review of available medical records.  Behavioral Observation: [name]  presents as a 68 y.o.-year-old Right Caucasian Female who appeared her stated age. her dress was Appropriate and she was Well Groomed and her manners were Appropriate to the situation.  her participation was indicative of Appropriate and Attentive behaviors.  There were not any physical disabilities noted.  she displayed an appropriate level of cooperation and motivation.     Interactions:    Active Appropriate  Attention:   within normal limits and attention span and concentration were age appropriate  Memory:   abnormal; remote memory intact, recent memory impaired  Visuo-spatial:  not examined  Speech (Volume):  normal  Speech:   normal; normal  Thought Process:  Coherent and Relevant  Though Content:  WNL; not suicidal and not homicidal  Orientation:   person, place, time/date and situation  Judgment:   Good  Planning:   Good  Affect:    Appropriate  Mood:    Euthymic  Insight:   Good  Intelligence:   high  Marital Status/Living: The patient was born in [place] and has a 2 siblings.  She does acknowledge having some degree of anxiety and worry when she was a child.  The patient is  widowed and is currently living with her boyfriend of 10 years and has been living with him for the past 3 to 4 years.  The patient has a 42 year old daughter and 2 granddaughters as well as a 29 year old son.  Current Employment: The patient is retired.  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  Substance  Use:  No concerns of substance abuse are reported.    Education:   The patient graduated from college attending [employer] with a 3.5 GPA.  Medical History:   Past Medical History:  Diagnosis Date  . Anemia   . Anxiety   . Arthritis   . Chronic renal failure    patient denies   . GERD (gastroesophageal reflux disease)   . H/O tinnitus    left  . Headache(784.0)   . History of endometriosis   . History of uterine fibroid   . History of UTI   . Hypertension   . Migraine   . Recurrent pneumonia     Psychiatric History:  The patient denies any prior psychiatric history other than anxiety which she has had to some degree her entire life.  The patient denies any exacerbation of current anxiety levels.  Family Med/Psych History:  Family History  Problem Relation Age of Onset  . Emphysema Mother   . Heart disease Mother 80       MI    Risk of Suicide/Violence: low the patient denies any suicidal or homicidal ideation.  Impression/DX:  Bianca Haley is a 68 year old female referred by Dr. Rexene Alberts for neuropsychological evaluation due to diagnostic concerns related to her experiencing increasing short-term memory issues.    The patient reports that her daughter was pushing for this evaluation as a daughter is told the patient she has seen significant worsening of short-term memory loss over the past year.  The patient reports that she has begun using calendars for appointments due to some changes in memory.  The patient reports that she has just moved to a graduated care facility not due to issues per se but to have the availability of increasing care over the years.  She reports that her husband is having cognitive difficulties.  The patient denies any geographic disorientation and denies any observed changes in expressive language.  The patient reports that her mother passed away at 65 and had some memory loss due to hydrocephalus and was treated with a shunt.  The patient  recently had an MRI of her brain done suggesting mild parasylvian atrophy and mild chronic small vessel ischemic disease.  The patient reports that she has noted little change in her short-term memory but greater concerns are being reported to her by her daughter.  The patient denies any geographic disorientation or changes in expressive language.   Disposition/Plan:  The patient will be set up for formal neuropsychological testing including the Wechsler Adult Intelligence Scale-IV as well as the Wechsler Memory Scale- for older addition.  After these measures are completed a determination of whether we will need further neuropsychological testing will be determined.  Diagnosis:    Memory loss  Neurocognitive disorder         Electronically Signed   _______________________ Ilean Skill, Psy.D.

## 2018-07-19 ENCOUNTER — Inpatient Hospital Stay: Payer: Medicare Other | Admitting: Internal Medicine

## 2018-07-19 DIAGNOSIS — M6281 Muscle weakness (generalized): Secondary | ICD-10-CM | POA: Diagnosis not present

## 2018-07-19 DIAGNOSIS — M545 Low back pain: Secondary | ICD-10-CM | POA: Diagnosis not present

## 2018-07-19 DIAGNOSIS — R2681 Unsteadiness on feet: Secondary | ICD-10-CM | POA: Diagnosis not present

## 2018-07-19 DIAGNOSIS — R2689 Other abnormalities of gait and mobility: Secondary | ICD-10-CM | POA: Diagnosis not present

## 2018-07-21 DIAGNOSIS — M545 Low back pain: Secondary | ICD-10-CM | POA: Diagnosis not present

## 2018-07-21 DIAGNOSIS — R2681 Unsteadiness on feet: Secondary | ICD-10-CM | POA: Diagnosis not present

## 2018-07-21 DIAGNOSIS — R2689 Other abnormalities of gait and mobility: Secondary | ICD-10-CM | POA: Diagnosis not present

## 2018-07-21 DIAGNOSIS — M6281 Muscle weakness (generalized): Secondary | ICD-10-CM | POA: Diagnosis not present

## 2018-07-22 ENCOUNTER — Telehealth: Payer: Self-pay | Admitting: Internal Medicine

## 2018-07-22 DIAGNOSIS — R2681 Unsteadiness on feet: Secondary | ICD-10-CM | POA: Diagnosis not present

## 2018-07-22 DIAGNOSIS — M545 Low back pain: Secondary | ICD-10-CM | POA: Diagnosis not present

## 2018-07-22 DIAGNOSIS — M6281 Muscle weakness (generalized): Secondary | ICD-10-CM | POA: Diagnosis not present

## 2018-07-22 DIAGNOSIS — R2689 Other abnormalities of gait and mobility: Secondary | ICD-10-CM | POA: Diagnosis not present

## 2018-07-22 NOTE — Telephone Encounter (Signed)
Patient cancelled hospital fu for 07/19/18.  Please advise on reschedule.

## 2018-07-24 ENCOUNTER — Other Ambulatory Visit: Payer: Self-pay | Admitting: Internal Medicine

## 2018-07-26 DIAGNOSIS — R2681 Unsteadiness on feet: Secondary | ICD-10-CM | POA: Diagnosis not present

## 2018-07-26 DIAGNOSIS — M6281 Muscle weakness (generalized): Secondary | ICD-10-CM | POA: Diagnosis not present

## 2018-07-26 DIAGNOSIS — M545 Low back pain: Secondary | ICD-10-CM | POA: Diagnosis not present

## 2018-07-26 DIAGNOSIS — R2689 Other abnormalities of gait and mobility: Secondary | ICD-10-CM | POA: Diagnosis not present

## 2018-07-28 NOTE — Telephone Encounter (Signed)
Virtual visit 

## 2018-07-28 NOTE — Telephone Encounter (Signed)
I have left patient a vm to call back if she is interested in scheduling V visit.  Please transfer call to office.

## 2018-07-29 DIAGNOSIS — M545 Low back pain: Secondary | ICD-10-CM | POA: Diagnosis not present

## 2018-07-29 DIAGNOSIS — R2689 Other abnormalities of gait and mobility: Secondary | ICD-10-CM | POA: Diagnosis not present

## 2018-07-29 DIAGNOSIS — R2681 Unsteadiness on feet: Secondary | ICD-10-CM | POA: Diagnosis not present

## 2018-07-29 DIAGNOSIS — M6281 Muscle weakness (generalized): Secondary | ICD-10-CM | POA: Diagnosis not present

## 2018-08-01 DIAGNOSIS — M545 Low back pain: Secondary | ICD-10-CM | POA: Diagnosis not present

## 2018-08-01 DIAGNOSIS — R2689 Other abnormalities of gait and mobility: Secondary | ICD-10-CM | POA: Diagnosis not present

## 2018-08-01 DIAGNOSIS — M6281 Muscle weakness (generalized): Secondary | ICD-10-CM | POA: Diagnosis not present

## 2018-08-01 DIAGNOSIS — R2681 Unsteadiness on feet: Secondary | ICD-10-CM | POA: Diagnosis not present

## 2018-08-02 DIAGNOSIS — M6281 Muscle weakness (generalized): Secondary | ICD-10-CM | POA: Diagnosis not present

## 2018-08-02 DIAGNOSIS — M545 Low back pain: Secondary | ICD-10-CM | POA: Diagnosis not present

## 2018-08-02 DIAGNOSIS — R2681 Unsteadiness on feet: Secondary | ICD-10-CM | POA: Diagnosis not present

## 2018-08-02 DIAGNOSIS — R2689 Other abnormalities of gait and mobility: Secondary | ICD-10-CM | POA: Diagnosis not present

## 2018-08-05 DIAGNOSIS — M545 Low back pain: Secondary | ICD-10-CM | POA: Diagnosis not present

## 2018-08-05 DIAGNOSIS — R2681 Unsteadiness on feet: Secondary | ICD-10-CM | POA: Diagnosis not present

## 2018-08-05 DIAGNOSIS — M6281 Muscle weakness (generalized): Secondary | ICD-10-CM | POA: Diagnosis not present

## 2018-08-05 DIAGNOSIS — R2689 Other abnormalities of gait and mobility: Secondary | ICD-10-CM | POA: Diagnosis not present

## 2018-08-07 ENCOUNTER — Other Ambulatory Visit: Payer: Self-pay | Admitting: Family Medicine

## 2018-08-07 DIAGNOSIS — J189 Pneumonia, unspecified organism: Secondary | ICD-10-CM

## 2018-08-08 ENCOUNTER — Other Ambulatory Visit: Payer: Self-pay | Admitting: Internal Medicine

## 2018-08-08 ENCOUNTER — Inpatient Hospital Stay: Payer: Medicare Other | Admitting: Internal Medicine

## 2018-08-09 ENCOUNTER — Ambulatory Visit: Payer: Medicare Other | Admitting: Gastroenterology

## 2018-08-09 ENCOUNTER — Encounter: Payer: Self-pay | Admitting: Internal Medicine

## 2018-08-09 ENCOUNTER — Ambulatory Visit (INDEPENDENT_AMBULATORY_CARE_PROVIDER_SITE_OTHER): Payer: Medicare Other | Admitting: Internal Medicine

## 2018-08-09 DIAGNOSIS — G9341 Metabolic encephalopathy: Secondary | ICD-10-CM | POA: Diagnosis not present

## 2018-08-09 DIAGNOSIS — I10 Essential (primary) hypertension: Secondary | ICD-10-CM | POA: Diagnosis not present

## 2018-08-09 DIAGNOSIS — K219 Gastro-esophageal reflux disease without esophagitis: Secondary | ICD-10-CM | POA: Diagnosis not present

## 2018-08-09 DIAGNOSIS — M545 Low back pain: Secondary | ICD-10-CM | POA: Diagnosis not present

## 2018-08-09 DIAGNOSIS — R2689 Other abnormalities of gait and mobility: Secondary | ICD-10-CM | POA: Diagnosis not present

## 2018-08-09 DIAGNOSIS — J069 Acute upper respiratory infection, unspecified: Secondary | ICD-10-CM

## 2018-08-09 DIAGNOSIS — R2681 Unsteadiness on feet: Secondary | ICD-10-CM | POA: Diagnosis not present

## 2018-08-09 DIAGNOSIS — M6281 Muscle weakness (generalized): Secondary | ICD-10-CM | POA: Diagnosis not present

## 2018-08-09 NOTE — Assessment & Plan Note (Signed)
COVID-229E infection - recovered

## 2018-08-09 NOTE — Assessment & Plan Note (Signed)
Resolved

## 2018-08-09 NOTE — Assessment & Plan Note (Signed)
Atenolol, Diltiazem Maxzide

## 2018-08-09 NOTE — Assessment & Plan Note (Signed)
Protonix bid

## 2018-08-09 NOTE — Progress Notes (Signed)
Virtual Visit via Telephone Note  I connected with Bianca Haley on 08/09/18 at  1:20 PM EDT by telephone and verified that I am speaking with the correct person using two identifiers.   I discussed the limitations, risks, security and privacy concerns of performing an evaluation and management service by telephone and the availability of in person appointments. I also discussed with the patient that there may be a patient responsible charge related to this service. The patient expressed understanding and agreed to proceed.   F/u chronic pain, HTN, GERD   History of Present Illness:  F/u post-hosp for:  Brief/Interim Summary: 68 year old female who presented with altered mentation and fever.  She does have the significant past medical history for chronic back pain, arthritis, gastroesophageal reflux disease, hypertension and migraines.  Patient reported diarrhea 4 hours prior to hospitalization, feeling fatigue and malaise, on the day of hospitalization her temperature reached 101 F, she was noted to be altered and disoriented.  On her initial physical examination her temperature was 102 F, her blood pressure was 122/54, heart rate 60, respiratory 14, oxygen saturation 88 to 91%.  She had moist mucous membranes, lungs were clear to auscultation bilaterally, heart S1-S2 present rhythmic, the abdomen was tender in the right upper quadrant without guarding or rebound, no lower extremity edema.  Sodium 136, potassium 4.1, chloride 99, bicarb 27, glucose 95, BUN 21, creatinine 1.16, white cell count 4.5, hemoglobin 12.7, hematocrit 42.3, platelets 160, her urinalysis had 0-5 white cells, 30 protein, specific gravity 1.026.  Respiratory viral panel was positive for coronavirus 229 E, CT of the abdomen with common bile duct and pancreatic duct dilatation to the region of the ampulla without definite obstructive cause identified.  Positive distended gallbladder with equivocal gallbladder wall thickening  and adjacent inflammation.  Chest x-ray with left rotation, no infiltrates, CT chest no pulmonary embolism, faint bilateral groundglass infiltrates.  Her EKG was normal sinus rhythm, normal axis, normal intervals, normal conduction, no significant ST segment or T wave changes.  Patient was admitted to the hospital with a working diagnosis of acute metabolic encephalopathy likely related to coronavirus infection, complicated by acute hypoxic respiratory failure.  1.  Acute metabolic encephalopathy due to coronavirus infection 229E. (Present on admission) (no clinical signs of sepsis).  It was admitted to the hospital, neuro check monitoring, supportive medical therapy with intravenous fluids and empiric IV antibiotics.  She responded well, found to be stable for discharge March 11.  No signs of bacterial infections, antibiotics were discontinued.   2.  Acute hypoxic respiratory failure due to viral pneumonia (present on admission).  Patient received supportive medical therapy, supplemental oxygen per nasal cannula and oximetry monitoring.  No signs of bacterial pneumonia, likely groundglass opacities may represent a viral pneumonia.  At discharge oxygen saturation on room air 99%.  No further antibiotic needed.  3.  Common bile duct and pancreatic duct dilatation.  No clinical signs of cholecystitis, patient underwent further work-up with MRCP, showing normal liver, no focal lesions, gallbladder distention, no gallstones.  Mild intra-and prominent extrahepatic bile duct dilatation.  Diffuse pancreatic ductal dilatation.  Unable to rule out occult ampullary lesion.  Gastroenterology was consulted and patient will have outpatient ERCP versus endoscopic ultrasound.  4.  Hypertension.  Pressures been well controlled, continue atenolol.  5.  Chronic back pain.  Continue analgesics, oxymorphone,   6.  Anxiety/depression.  Patient will resume his psychotropic agents. Paroxetine, trazodone, and diazepam.    7. HTN. Continue atenolol, and  diltiazem.  8. Dyslipidemia. Continue rosuvastatin.     Observations/Objective:   Assessment and Plan:   Follow Up Instructions:    I discussed the assessment and treatment plan with the patient. The patient was provided an opportunity to ask questions and all were answered. The patient agreed with the plan and demonstrated an understanding of the instructions.   The patient was advised to call back or seek an in-person evaluation if the symptoms worsen or if the condition fails to improve as anticipated.  I provided 20 minutes of non-face-to-face time during this encounter.   [name], MD

## 2018-08-11 DIAGNOSIS — M545 Low back pain: Secondary | ICD-10-CM | POA: Diagnosis not present

## 2018-08-11 DIAGNOSIS — R2681 Unsteadiness on feet: Secondary | ICD-10-CM | POA: Diagnosis not present

## 2018-08-11 DIAGNOSIS — R2689 Other abnormalities of gait and mobility: Secondary | ICD-10-CM | POA: Diagnosis not present

## 2018-08-11 DIAGNOSIS — M6281 Muscle weakness (generalized): Secondary | ICD-10-CM | POA: Diagnosis not present

## 2018-08-15 DIAGNOSIS — M6281 Muscle weakness (generalized): Secondary | ICD-10-CM | POA: Diagnosis not present

## 2018-08-15 DIAGNOSIS — R2681 Unsteadiness on feet: Secondary | ICD-10-CM | POA: Diagnosis not present

## 2018-08-15 DIAGNOSIS — M545 Low back pain: Secondary | ICD-10-CM | POA: Diagnosis not present

## 2018-08-15 DIAGNOSIS — R2689 Other abnormalities of gait and mobility: Secondary | ICD-10-CM | POA: Diagnosis not present

## 2018-08-18 DIAGNOSIS — M6281 Muscle weakness (generalized): Secondary | ICD-10-CM | POA: Diagnosis not present

## 2018-08-18 DIAGNOSIS — R2681 Unsteadiness on feet: Secondary | ICD-10-CM | POA: Diagnosis not present

## 2018-08-18 DIAGNOSIS — R2689 Other abnormalities of gait and mobility: Secondary | ICD-10-CM | POA: Diagnosis not present

## 2018-08-18 DIAGNOSIS — M545 Low back pain: Secondary | ICD-10-CM | POA: Diagnosis not present

## 2018-08-23 DIAGNOSIS — L738 Other specified follicular disorders: Secondary | ICD-10-CM | POA: Diagnosis not present

## 2018-08-24 DIAGNOSIS — M545 Low back pain: Secondary | ICD-10-CM | POA: Diagnosis not present

## 2018-08-24 DIAGNOSIS — R2689 Other abnormalities of gait and mobility: Secondary | ICD-10-CM | POA: Diagnosis not present

## 2018-08-24 DIAGNOSIS — R2681 Unsteadiness on feet: Secondary | ICD-10-CM | POA: Diagnosis not present

## 2018-08-24 DIAGNOSIS — M6281 Muscle weakness (generalized): Secondary | ICD-10-CM | POA: Diagnosis not present

## 2018-08-26 DIAGNOSIS — R2681 Unsteadiness on feet: Secondary | ICD-10-CM | POA: Diagnosis not present

## 2018-08-26 DIAGNOSIS — R2689 Other abnormalities of gait and mobility: Secondary | ICD-10-CM | POA: Diagnosis not present

## 2018-08-26 DIAGNOSIS — M6281 Muscle weakness (generalized): Secondary | ICD-10-CM | POA: Diagnosis not present

## 2018-08-26 DIAGNOSIS — M545 Low back pain: Secondary | ICD-10-CM | POA: Diagnosis not present

## 2018-08-29 DIAGNOSIS — M545 Low back pain: Secondary | ICD-10-CM | POA: Diagnosis not present

## 2018-08-29 DIAGNOSIS — R2681 Unsteadiness on feet: Secondary | ICD-10-CM | POA: Diagnosis not present

## 2018-08-29 DIAGNOSIS — R2689 Other abnormalities of gait and mobility: Secondary | ICD-10-CM | POA: Diagnosis not present

## 2018-08-29 DIAGNOSIS — M6281 Muscle weakness (generalized): Secondary | ICD-10-CM | POA: Diagnosis not present

## 2018-08-31 DIAGNOSIS — R2689 Other abnormalities of gait and mobility: Secondary | ICD-10-CM | POA: Diagnosis not present

## 2018-08-31 DIAGNOSIS — M6281 Muscle weakness (generalized): Secondary | ICD-10-CM | POA: Diagnosis not present

## 2018-08-31 DIAGNOSIS — R2681 Unsteadiness on feet: Secondary | ICD-10-CM | POA: Diagnosis not present

## 2018-08-31 DIAGNOSIS — M545 Low back pain: Secondary | ICD-10-CM | POA: Diagnosis not present

## 2018-09-01 ENCOUNTER — Telehealth: Payer: Self-pay | Admitting: Pulmonary Disease

## 2018-09-01 ENCOUNTER — Other Ambulatory Visit: Payer: Self-pay | Admitting: Pulmonary Disease

## 2018-09-01 DIAGNOSIS — J189 Pneumonia, unspecified organism: Secondary | ICD-10-CM

## 2018-09-01 MED ORDER — ALBUTEROL SULFATE HFA 108 (90 BASE) MCG/ACT IN AERS: INHALATION_SPRAY | RESPIRATORY_TRACT | 2 refills | Status: DC

## 2018-09-01 NOTE — Telephone Encounter (Signed)
Returned call to patient to make aware Ventolin has been sent to pharmacy. Nothing further needed

## 2018-09-05 DIAGNOSIS — M6281 Muscle weakness (generalized): Secondary | ICD-10-CM | POA: Diagnosis not present

## 2018-09-07 DIAGNOSIS — M6281 Muscle weakness (generalized): Secondary | ICD-10-CM | POA: Diagnosis not present

## 2018-09-13 DIAGNOSIS — M6281 Muscle weakness (generalized): Secondary | ICD-10-CM | POA: Diagnosis not present

## 2018-09-15 DIAGNOSIS — M6281 Muscle weakness (generalized): Secondary | ICD-10-CM | POA: Diagnosis not present

## 2018-09-20 DIAGNOSIS — Z981 Arthrodesis status: Secondary | ICD-10-CM | POA: Diagnosis not present

## 2018-09-20 DIAGNOSIS — M6281 Muscle weakness (generalized): Secondary | ICD-10-CM | POA: Diagnosis not present

## 2018-09-20 DIAGNOSIS — M5441 Lumbago with sciatica, right side: Secondary | ICD-10-CM | POA: Diagnosis not present

## 2018-09-21 ENCOUNTER — Ambulatory Visit: Payer: Medicare Other | Admitting: Gastroenterology

## 2018-09-21 ENCOUNTER — Encounter: Payer: Self-pay | Admitting: Gastroenterology

## 2018-09-21 ENCOUNTER — Other Ambulatory Visit (INDEPENDENT_AMBULATORY_CARE_PROVIDER_SITE_OTHER): Payer: Medicare Other

## 2018-09-21 ENCOUNTER — Other Ambulatory Visit: Payer: Self-pay

## 2018-09-21 ENCOUNTER — Ambulatory Visit (INDEPENDENT_AMBULATORY_CARE_PROVIDER_SITE_OTHER): Payer: Medicare Other | Admitting: Gastroenterology

## 2018-09-21 VITALS — Ht 65.5 in | Wt 139.0 lb

## 2018-09-21 DIAGNOSIS — R935 Abnormal findings on diagnostic imaging of other abdominal regions, including retroperitoneum: Secondary | ICD-10-CM

## 2018-09-21 DIAGNOSIS — K838 Other specified diseases of biliary tract: Secondary | ICD-10-CM | POA: Diagnosis not present

## 2018-09-21 DIAGNOSIS — K8689 Other specified diseases of pancreas: Secondary | ICD-10-CM

## 2018-09-21 LAB — CBC WITH DIFFERENTIAL/PLATELET
Basophils Absolute: 0 10*3/uL (ref 0.0–0.1)
Basophils Relative: 0.6 % (ref 0.0–3.0)
Eosinophils Absolute: 0.2 10*3/uL (ref 0.0–0.7)
Eosinophils Relative: 4.3 % (ref 0.0–5.0)
HCT: 37.1 % (ref 36.0–46.0)
Hemoglobin: 12.5 g/dL (ref 12.0–15.0)
Lymphocytes Relative: 31.1 % (ref 12.0–46.0)
Lymphs Abs: 1.5 10*3/uL (ref 0.7–4.0)
MCHC: 33.8 g/dL (ref 30.0–36.0)
MCV: 83.9 fl (ref 78.0–100.0)
Monocytes Absolute: 0.4 10*3/uL (ref 0.1–1.0)
Monocytes Relative: 8 % (ref 3.0–12.0)
Neutro Abs: 2.7 10*3/uL (ref 1.4–7.7)
Neutrophils Relative %: 56 % (ref 43.0–77.0)
Platelets: 204 10*3/uL (ref 150.0–400.0)
RBC: 4.42 Mil/uL (ref 3.87–5.11)
RDW: 14.6 % (ref 11.5–15.5)
WBC: 4.9 10*3/uL (ref 4.0–10.5)

## 2018-09-21 LAB — COMPREHENSIVE METABOLIC PANEL
ALT: 11 U/L (ref 0–35)
AST: 21 U/L (ref 0–37)
Albumin: 4.3 g/dL (ref 3.5–5.2)
Alkaline Phosphatase: 89 U/L (ref 39–117)
BUN: 17 mg/dL (ref 6–23)
CO2: 35 mEq/L — ABNORMAL HIGH (ref 19–32)
Calcium: 9.2 mg/dL (ref 8.4–10.5)
Chloride: 101 mEq/L (ref 96–112)
Creatinine, Ser: 0.96 mg/dL (ref 0.40–1.20)
GFR: 57.44 mL/min — ABNORMAL LOW (ref >60.00)
Glucose, Bld: 87 mg/dL (ref 70–99)
Potassium: 4.3 mEq/L (ref 3.5–5.1)
Sodium: 141 mEq/L (ref 135–145)
Total Bilirubin: 0.3 mg/dL (ref 0.2–1.2)
Total Protein: 7.2 g/dL (ref 6.0–8.3)

## 2018-09-21 LAB — PROTIME-INR
INR: 1 ratio (ref 0.8–1.0)
Prothrombin Time: 11.6 s (ref 9.6–13.1)

## 2018-09-21 LAB — AMYLASE: Amylase: 39 U/L (ref 27–131)

## 2018-09-21 LAB — LIPASE: Lipase: 31 U/L (ref 11.0–59.0)

## 2018-09-21 NOTE — Progress Notes (Signed)
History of Present Illness: This is a 68 year old female who was hospitalized on March 10 with a viral pneumonia felt secondary to coronavirus 229E by nasopharyngeal swab testing however Covid-19 testing was apparently not available at that time.  CT scan of the chest revealed an upper abdominal abnormality so CT AP was performed that showed common bile duct and pancreatic duct dilation to the region of the ampulla without a definite obstructing cause identified.  The gallbladder was distended with equivocal wall thickening and adjacent inflammation.  MRI / MRCP was performed showing diffuse gallbladder distention, intra-and extrahepatic biliary dilatation, pancreatic duct dilatation, no obstructing stone or lesion identified.  Occult ampullary lesion not excluded.  Liver function tests were normal.  She states she fully recovered from her viral pneumonia and feels well.  She has no gastrointestinal complaints. Denies weight loss, abdominal pain, constipation, diarrhea, change in stool caliber, melena, hematochezia, nausea, vomiting, dysphagia, reflux symptoms, chest pain.  Colonoscopy 04/2014: Small colon polyp (tubluar adenoma), diverticulosis, hemorrhoids EGD 04/2014: LA Class A erosive esophagitis, small hiatal hernia, prepyloric gastritis   Allergies  Allergen Reactions  . Penicillins Shortness Of Breath and Rash    .Marland KitchenHas patient had a PCN reaction causing immediate rash, facial/tongue/throat swelling, SOB or lightheadedness with hypotension: No Has patient had a PCN reaction causing severe rash involving mucus membranes or skin necrosis: No Has patient had a PCN reaction that required hospitalization No Has patient had a PCN reaction occurring within the last 10 years: No If all of the above answers are "NO", then may proceed with Cephalosporin use.   [name] Morphine And Related Swelling    Sedation/Swelling described like edema/bloating/doesn't help the pain Morphine only; tolerates other  opioids.   . Cefepime Rash    Reaction while taking both cefepime and vancomycin at [facility] (after 3 days)  . Vancomycin Rash    Reaction while taking both cefepime and vancomycin at [facility] (after 3 days)   Outpatient Medications Prior to Visit  Medication Sig Dispense Refill  . acetaminophen (TYLENOL) 325 MG tablet Take 1-2 tablets (325-650 mg total) by mouth every 6 (six) hours as needed for mild pain or moderate pain.    [name] albuterol (VENTOLIN HFA) 108 (90 Base) MCG/ACT inhaler INHALE 2 PUFFS INTO THE LUNGS EVERY 4 HOURS AS NEEDED FOR WHEEZING OR SHORTNESS OF BREATH 18 g 2  . atenolol (TENORMIN) 100 MG tablet TAKE 1 TABLET(100 MG) BY MOUTH EVERY DAY (Patient taking differently: Take 100 mg by mouth daily. ) 90 tablet 3  . b complex vitamins tablet Take 1 tablet daily by mouth. 100 tablet 3  . benzonatate (TESSALON) 200 MG capsule One every  8 hours as needed (Patient taking differently: Take 200 mg by mouth 3 (three) times daily as needed for cough. ) 45 capsule 2  . Cholecalciferol (VITAMIN D) 2000 units tablet Take 2,000 Units by mouth daily.    . diazepam (VALIUM) 5 MG tablet Take 1 tablet (5 mg total) by mouth as needed for anxiety or muscle spasms. 30 tablet 2  . diltiazem (CARDIZEM CD) 180 MG 24 hr capsule TAKE 1 CAPSULE(180 MG) BY MOUTH DAILY 90 capsule 1  . Multiple Vitamin (MULTIVITAMIN WITH MINERALS) TABS tablet Take 1 tablet by mouth daily.    [name] OVER THE COUNTER MEDICATION Place 1 application into both eyes at bedtime as needed (dry eyes). Over the counter lubricant eye gel    . oxymorphone (OPANA) 10 MG tablet Take 10 mg by mouth 2 (two)  times daily as needed for pain.     . pantoprazole (PROTONIX) 40 MG tablet Take 1 tablet (40 mg total) by mouth 2 (two) times daily before a meal. 180 tablet 3  . PARoxetine (PAXIL) 40 MG tablet Take 1 tablet (40 mg total) by mouth 2 (two) times daily. 180 tablet 3  . polyethylene glycol (MIRALAX / GLYCOLAX) packet Take 17 g by mouth 2 (two) times  daily. (Patient taking differently: Take 17 g by mouth daily as needed (constipation). Mix in 8 oz liquid and drink) 14 each 0  . potassium chloride (K-DUR) 10 MEQ tablet TAKE 1 TABLET(10 MEQ) BY MOUTH DAILY (Patient taking differently: Take 10 mEq by mouth daily. ) 30 tablet 3  . rosuvastatin (CRESTOR) 5 MG tablet TAKE 1 TABLET(5 MG) BY MOUTH DAILY 90 tablet 3  . SUMAtriptan (IMITREX) 100 MG tablet TAKE 1 TABLET BY MOUTH DAILY AS NEEDED FOR MIGRAINE, MAY REPEAT IN 2 HOURS IF HEADACHE PERSISTS OR RECURS (Patient taking differently: Take 100 mg by mouth every 2 (two) hours as needed for migraine. ) 12 tablet 0  . traZODone (DESYREL) 150 MG tablet Take 1 tablet (150 mg total) by mouth at bedtime. 90 tablet 3  . gabapentin (NEURONTIN) 300 MG capsule Take 1 capsule (300 mg total) by mouth 3 (three) times daily for 30 days. 90 capsule 0   No facility-administered medications prior to visit.    Past Medical History:  Diagnosis Date  . Anemia   . Anxiety   . Arthritis   . Chronic renal failure    patient denies   . GERD (gastroesophageal reflux disease)   . H/O tinnitus    left  . Headache(784.0)   . History of endometriosis   . History of uterine fibroid   . History of UTI   . Hypertension   . Migraine   . Recurrent pneumonia    Past Surgical History:  Procedure Laterality Date  . ABDOMINAL HYSTERECTOMY    . BACK SURGERY    . BLADDER SUSPENSION    . COLONOSCOPY  02/16/2008   normal  . laparoscopy     for evauation of endometriosis  . TOTAL KNEE ARTHROPLASTY Left 07/15/2015   Procedure: TOTAL LEFT KNEE ARTHROPLASTY;  Surgeon: Paralee Cancel, MD;  Location: [facility];  Service: Orthopedics;  Laterality: Left;   Social History   Socioeconomic History  . Marital status: Married    Spouse name: Not on file  . Number of children: 2  . Years of education: 5  . Highest education level: Not on file  Occupational History  . Occupation: Futures trader  Social Needs  . Financial  resource strain: Not on file  . Food insecurity:    Worry: Not on file    Inability: Not on file  . Transportation needs:    Medical: Not on file    Non-medical: Not on file  Tobacco Use  . Smoking status: Never Smoker  . Smokeless tobacco: Never Used  Substance and Sexual Activity  . Alcohol use: No    Alcohol/week: 0.0 standard drinks    Comment: As of 07/2018, it is been several years since she drank any alcohol.  . Drug use: No  . Sexual activity: Yes    Partners: Male  Lifestyle  . Physical activity:    Days per week: Not on file    Minutes per session: Not on file  . Stress: Not on file  Relationships  . Social connections:    Talks  on phone: Not on file    Gets together: Not on file    Attends religious service: Not on file    Active member of club or organization: Not on file    Attends meetings of clubs or organizations: Not on file    Relationship status: Not on file  Other Topics Concern  . Not on file  Social History Narrative   HS[employer], [employer] BS interior design. Married 1977/08/11 - 33 years, her husband died in 1997/08/11.  She has been with her current, second husband since around 2008-08-11.  1 dtr - 08/12/1982 - lawyer, 1 son 1987/08/12. No h/o abuse.   Family History  Problem Relation Age of Onset  . Emphysema Mother   . Heart disease Mother 27       MI       Physical Exam: Telemedicine - not performed   Assessment and Recommendations:  1.  Dilated biliary tree and dilated pancreatic duct.  Suspected obstruction however no cause of obstruction identified on imaging studies in 12-Aug-2022. R/O pancreatic lesion, ampullary lesion, etc. CBC, CMP, amylase, lipase, PT/INR this week. Minimize Opana usage. Recommended she consider EUS to further evaluate.  Patient reluctant to come to hospital for a procedure during current pandemic. If LFTs are normal offered option of repeat MRI/MRCP at 3 months from her prior study.   2.  Personal history of adenomatous colon polyps.  A 5-year interval  surveillance colonoscopy is recommended in December 2020.  3.  GERD with history of LA class A esophagitis.  Follow standard antireflux measures and continue pantoprazole 40 mg twice daily.   These services were provided via telemedicine, audio only per patient request.  The patient was at home and the provider was in the office, alone.  We discussed the limitations of evaluation and management by telemedicine and the availability of in person appointments.  Patient consented for this telemedicine visit and is aware of possible charges for this service.  The other person participating in the telemedicine service was Marlon Pel, CMA who reviewed medications, allergies, past history and completed AVS.  Time spent on call: 16 minutes

## 2018-09-21 NOTE — H&P (View-Only) (Signed)
History of Present Illness: This is a 68 year old female who was hospitalized on March 10 with a viral pneumonia felt secondary to coronavirus 229E by nasopharyngeal swab testing however Covid-19 testing was apparently not available at that time.  CT scan of the chest revealed an upper abdominal abnormality so CT AP was performed that showed common bile duct and pancreatic duct dilation to the region of the ampulla without a definite obstructing cause identified.  The gallbladder was distended with equivocal wall thickening and adjacent inflammation.  MRI / MRCP was performed showing diffuse gallbladder distention, intra-and extrahepatic biliary dilatation, pancreatic duct dilatation, no obstructing stone or lesion identified.  Occult ampullary lesion not excluded.  Liver function tests were normal.  She states she fully recovered from her viral pneumonia and feels well.  She has no gastrointestinal complaints. Denies weight loss, abdominal pain, constipation, diarrhea, change in stool caliber, melena, hematochezia, nausea, vomiting, dysphagia, reflux symptoms, chest pain.  Colonoscopy 04/2014: Small colon polyp (tubluar adenoma), diverticulosis, hemorrhoids EGD 04/2014: LA Class A erosive esophagitis, small hiatal hernia, prepyloric gastritis   Allergies  Allergen Reactions  . Penicillins Shortness Of Breath and Rash    .Marland KitchenHas patient had a PCN reaction causing immediate rash, facial/tongue/throat swelling, SOB or lightheadedness with hypotension: No Has patient had a PCN reaction causing severe rash involving mucus membranes or skin necrosis: No Has patient had a PCN reaction that required hospitalization No Has patient had a PCN reaction occurring within the last 10 years: No If all of the above answers are "NO", then may proceed with Cephalosporin use.   [name] Morphine And Related Swelling    Sedation/Swelling described like edema/bloating/doesn't help the pain Morphine only; tolerates other  opioids.   . Cefepime Rash    Reaction while taking both cefepime and vancomycin at [facility] (after 3 days)  . Vancomycin Rash    Reaction while taking both cefepime and vancomycin at [facility] (after 3 days)   Outpatient Medications Prior to Visit  Medication Sig Dispense Refill  . acetaminophen (TYLENOL) 325 MG tablet Take 1-2 tablets (325-650 mg total) by mouth every 6 (six) hours as needed for mild pain or moderate pain.    [name] albuterol (VENTOLIN HFA) 108 (90 Base) MCG/ACT inhaler INHALE 2 PUFFS INTO THE LUNGS EVERY 4 HOURS AS NEEDED FOR WHEEZING OR SHORTNESS OF BREATH 18 g 2  . atenolol (TENORMIN) 100 MG tablet TAKE 1 TABLET(100 MG) BY MOUTH EVERY DAY (Patient taking differently: Take 100 mg by mouth daily. ) 90 tablet 3  . b complex vitamins tablet Take 1 tablet daily by mouth. 100 tablet 3  . benzonatate (TESSALON) 200 MG capsule One every  8 hours as needed (Patient taking differently: Take 200 mg by mouth 3 (three) times daily as needed for cough. ) 45 capsule 2  . Cholecalciferol (VITAMIN D) 2000 units tablet Take 2,000 Units by mouth daily.    . diazepam (VALIUM) 5 MG tablet Take 1 tablet (5 mg total) by mouth as needed for anxiety or muscle spasms. 30 tablet 2  . diltiazem (CARDIZEM CD) 180 MG 24 hr capsule TAKE 1 CAPSULE(180 MG) BY MOUTH DAILY 90 capsule 1  . Multiple Vitamin (MULTIVITAMIN WITH MINERALS) TABS tablet Take 1 tablet by mouth daily.    [name] OVER THE COUNTER MEDICATION Place 1 application into both eyes at bedtime as needed (dry eyes). Over the counter lubricant eye gel    . oxymorphone (OPANA) 10 MG tablet Take 10 mg by mouth 2 (two)  times daily as needed for pain.     . pantoprazole (PROTONIX) 40 MG tablet Take 1 tablet (40 mg total) by mouth 2 (two) times daily before a meal. 180 tablet 3  . PARoxetine (PAXIL) 40 MG tablet Take 1 tablet (40 mg total) by mouth 2 (two) times daily. 180 tablet 3  . polyethylene glycol (MIRALAX / GLYCOLAX) packet Take 17 g by mouth 2 (two) times  daily. (Patient taking differently: Take 17 g by mouth daily as needed (constipation). Mix in 8 oz liquid and drink) 14 each 0  . potassium chloride (K-DUR) 10 MEQ tablet TAKE 1 TABLET(10 MEQ) BY MOUTH DAILY (Patient taking differently: Take 10 mEq by mouth daily. ) 30 tablet 3  . rosuvastatin (CRESTOR) 5 MG tablet TAKE 1 TABLET(5 MG) BY MOUTH DAILY 90 tablet 3  . SUMAtriptan (IMITREX) 100 MG tablet TAKE 1 TABLET BY MOUTH DAILY AS NEEDED FOR MIGRAINE, MAY REPEAT IN 2 HOURS IF HEADACHE PERSISTS OR RECURS (Patient taking differently: Take 100 mg by mouth every 2 (two) hours as needed for migraine. ) 12 tablet 0  . traZODone (DESYREL) 150 MG tablet Take 1 tablet (150 mg total) by mouth at bedtime. 90 tablet 3  . gabapentin (NEURONTIN) 300 MG capsule Take 1 capsule (300 mg total) by mouth 3 (three) times daily for 30 days. 90 capsule 0   No facility-administered medications prior to visit.    Past Medical History:  Diagnosis Date  . Anemia   . Anxiety   . Arthritis   . Chronic renal failure    patient denies   . GERD (gastroesophageal reflux disease)   . H/O tinnitus    left  . Headache(784.0)   . History of endometriosis   . History of uterine fibroid   . History of UTI   . Hypertension   . Migraine   . Recurrent pneumonia    Past Surgical History:  Procedure Laterality Date  . ABDOMINAL HYSTERECTOMY    . BACK SURGERY    . BLADDER SUSPENSION    . COLONOSCOPY  02/16/2008   normal  . laparoscopy     for evauation of endometriosis  . TOTAL KNEE ARTHROPLASTY Left 07/15/2015   Procedure: TOTAL LEFT KNEE ARTHROPLASTY;  Surgeon: Paralee Cancel, MD;  Location: [facility];  Service: Orthopedics;  Laterality: Left;   Social History   Socioeconomic History  . Marital status: Married    Spouse name: Not on file  . Number of children: 2  . Years of education: 49  . Highest education level: Not on file  Occupational History  . Occupation: Futures trader  Social Needs  . Financial  resource strain: Not on file  . Food insecurity:    Worry: Not on file    Inability: Not on file  . Transportation needs:    Medical: Not on file    Non-medical: Not on file  Tobacco Use  . Smoking status: Never Smoker  . Smokeless tobacco: Never Used  Substance and Sexual Activity  . Alcohol use: No    Alcohol/week: 0.0 standard drinks    Comment: As of 07/2018, it is been several years since she drank any alcohol.  . Drug use: No  . Sexual activity: Yes    Partners: Male  Lifestyle  . Physical activity:    Days per week: Not on file    Minutes per session: Not on file  . Stress: Not on file  Relationships  . Social connections:    Talks  on phone: Not on file    Gets together: Not on file    Attends religious service: Not on file    Active member of club or organization: Not on file    Attends meetings of clubs or organizations: Not on file    Relationship status: Not on file  Other Topics Concern  . Not on file  Social History Narrative   HS[employer], [employer] BS interior design. Married 08/18/77 - 49 years, her husband died in 1997-08-18.  She has been with her current, second husband since around Aug 18, 2008.  1 dtr - 08-19-1982 - lawyer, 1 son August 19, 1987. No h/o abuse.   Family History  Problem Relation Age of Onset  . Emphysema Mother   . Heart disease Mother 40       MI       Physical Exam: Telemedicine - not performed   Assessment and Recommendations:  1.  Dilated biliary tree and dilated pancreatic duct.  Suspected obstruction however no cause of obstruction identified on imaging studies in 08-19-22. R/O pancreatic lesion, ampullary lesion, etc. CBC, CMP, amylase, lipase, PT/INR this week. Minimize Opana usage. Recommended she consider EUS to further evaluate.  Patient reluctant to come to hospital for a procedure during current pandemic. If LFTs are normal offered option of repeat MRI/MRCP at 3 months from her prior study.   2.  Personal history of adenomatous colon polyps.  A 5-year interval  surveillance colonoscopy is recommended in December 2020.  3.  GERD with history of LA class A esophagitis.  Follow standard antireflux measures and continue pantoprazole 40 mg twice daily.   These services were provided via telemedicine, audio only per patient request.  The patient was at home and the provider was in the office, alone.  We discussed the limitations of evaluation and management by telemedicine and the availability of in person appointments.  Patient consented for this telemedicine visit and is aware of possible charges for this service.  The other person participating in the telemedicine service was Marlon Pel, CMA who reviewed medications, allergies, past history and completed AVS.  Time spent on call: 16 minutes

## 2018-09-21 NOTE — Patient Instructions (Signed)
Your provider has requested that you go to the basement level for lab work. Press "B" on the elevator. The lab is located at the first door on the left as you exit the elevator.  Thank you for choosing me and [facility].  Pricilla Riffle. Dagoberto Ligas., MD., Marval Regal

## 2018-09-22 ENCOUNTER — Other Ambulatory Visit: Payer: Self-pay

## 2018-09-22 DIAGNOSIS — K838 Other specified diseases of biliary tract: Secondary | ICD-10-CM

## 2018-09-22 DIAGNOSIS — R935 Abnormal findings on diagnostic imaging of other abdominal regions, including retroperitoneum: Secondary | ICD-10-CM

## 2018-09-22 DIAGNOSIS — K8689 Other specified diseases of pancreas: Secondary | ICD-10-CM

## 2018-09-22 DIAGNOSIS — M6281 Muscle weakness (generalized): Secondary | ICD-10-CM | POA: Diagnosis not present

## 2018-09-22 NOTE — Progress Notes (Signed)
lft

## 2018-09-23 ENCOUNTER — Ambulatory Visit: Payer: Medicare Other | Admitting: Gastroenterology

## 2018-09-23 ENCOUNTER — Telehealth: Payer: Self-pay | Admitting: Gastroenterology

## 2018-09-23 ENCOUNTER — Other Ambulatory Visit: Payer: Self-pay | Admitting: Internal Medicine

## 2018-09-23 NOTE — Telephone Encounter (Signed)
Pt would like to know if her hx of GERD symptoms could affect her dilated bile duct.

## 2018-09-27 DIAGNOSIS — M6281 Muscle weakness (generalized): Secondary | ICD-10-CM | POA: Diagnosis not present

## 2018-09-27 NOTE — Telephone Encounter (Signed)
All questions answered

## 2018-09-27 NOTE — Telephone Encounter (Signed)
Patient notified that GERD will not cause CBD dilation.

## 2018-09-29 ENCOUNTER — Other Ambulatory Visit: Payer: Self-pay | Admitting: Gastroenterology

## 2018-09-29 ENCOUNTER — Telehealth: Payer: Self-pay | Admitting: Gastroenterology

## 2018-09-29 DIAGNOSIS — K838 Other specified diseases of biliary tract: Secondary | ICD-10-CM

## 2018-09-29 DIAGNOSIS — M6281 Muscle weakness (generalized): Secondary | ICD-10-CM | POA: Diagnosis not present

## 2018-09-29 DIAGNOSIS — K8689 Other specified diseases of pancreas: Secondary | ICD-10-CM

## 2018-09-29 NOTE — Telephone Encounter (Signed)
Pt return call °

## 2018-10-04 DIAGNOSIS — M6281 Muscle weakness (generalized): Secondary | ICD-10-CM | POA: Diagnosis not present

## 2018-10-06 DIAGNOSIS — M6281 Muscle weakness (generalized): Secondary | ICD-10-CM | POA: Diagnosis not present

## 2018-10-11 DIAGNOSIS — M6281 Muscle weakness (generalized): Secondary | ICD-10-CM | POA: Diagnosis not present

## 2018-10-13 DIAGNOSIS — M6281 Muscle weakness (generalized): Secondary | ICD-10-CM | POA: Diagnosis not present

## 2018-10-17 ENCOUNTER — Other Ambulatory Visit (HOSPITAL_COMMUNITY)
Admission: RE | Admit: 2018-10-17 | Discharge: 2018-10-17 | Disposition: A | Discharge status: Home / Self Care | Payer: Medicare Other | Source: Ambulatory Visit | Attending: Gastroenterology | Admitting: Gastroenterology

## 2018-10-17 DIAGNOSIS — Z1159 Encounter for screening for other viral diseases: Secondary | ICD-10-CM | POA: Insufficient documentation

## 2018-10-18 ENCOUNTER — Other Ambulatory Visit: Payer: Self-pay

## 2018-10-18 ENCOUNTER — Telehealth: Payer: Self-pay | Admitting: Gastroenterology

## 2018-10-18 ENCOUNTER — Encounter (HOSPITAL_COMMUNITY): Payer: Self-pay

## 2018-10-18 LAB — NOVEL CORONAVIRUS, NAA (HOSP ORDER, SEND-OUT TO REF LAB; TAT 18-24 HRS): SARS-CoV-2, NAA: NOT DETECTED

## 2018-10-18 NOTE — Telephone Encounter (Signed)
Spoke to patient. All questions answered. Patient voiced understanding. 

## 2018-10-18 NOTE — Telephone Encounter (Signed)
Patient would like to know where she needs to go for her procedure and if she needs to stop taking any medication

## 2018-10-20 ENCOUNTER — Ambulatory Visit (HOSPITAL_COMMUNITY)
Admission: RE | Admit: 2018-10-20 | Discharge: 2018-10-20 | Disposition: A | Discharge status: Home / Self Care | Payer: Medicare Other | Attending: Gastroenterology | Admitting: Gastroenterology

## 2018-10-20 ENCOUNTER — Ambulatory Visit (HOSPITAL_COMMUNITY): Payer: Medicare Other | Admitting: Certified Registered"

## 2018-10-20 ENCOUNTER — Encounter (HOSPITAL_COMMUNITY)
Admission: RE | Disposition: A | Discharge status: Home / Self Care | Payer: Self-pay | Source: Home / Self Care | Attending: Gastroenterology

## 2018-10-20 ENCOUNTER — Encounter (HOSPITAL_COMMUNITY): Payer: Self-pay | Admitting: Certified Registered"

## 2018-10-20 ENCOUNTER — Other Ambulatory Visit: Payer: Self-pay

## 2018-10-20 DIAGNOSIS — D649 Anemia, unspecified: Secondary | ICD-10-CM | POA: Diagnosis not present

## 2018-10-20 DIAGNOSIS — G43909 Migraine, unspecified, not intractable, without status migrainosus: Secondary | ICD-10-CM | POA: Diagnosis not present

## 2018-10-20 DIAGNOSIS — K5730 Diverticulosis of large intestine without perforation or abscess without bleeding: Secondary | ICD-10-CM | POA: Diagnosis not present

## 2018-10-20 DIAGNOSIS — K219 Gastro-esophageal reflux disease without esophagitis: Secondary | ICD-10-CM | POA: Diagnosis not present

## 2018-10-20 DIAGNOSIS — K8689 Other specified diseases of pancreas: Secondary | ICD-10-CM

## 2018-10-20 DIAGNOSIS — I129 Hypertensive chronic kidney disease with stage 1 through stage 4 chronic kidney disease, or unspecified chronic kidney disease: Secondary | ICD-10-CM | POA: Insufficient documentation

## 2018-10-20 DIAGNOSIS — F419 Anxiety disorder, unspecified: Secondary | ICD-10-CM | POA: Diagnosis not present

## 2018-10-20 DIAGNOSIS — Z825 Family history of asthma and other chronic lower respiratory diseases: Secondary | ICD-10-CM | POA: Insufficient documentation

## 2018-10-20 DIAGNOSIS — K449 Diaphragmatic hernia without obstruction or gangrene: Secondary | ICD-10-CM | POA: Insufficient documentation

## 2018-10-20 DIAGNOSIS — Z86010 Personal history of colonic polyps: Secondary | ICD-10-CM | POA: Diagnosis not present

## 2018-10-20 DIAGNOSIS — Z881 Allergy status to other antibiotic agents status: Secondary | ICD-10-CM | POA: Diagnosis not present

## 2018-10-20 DIAGNOSIS — Z79899 Other long term (current) drug therapy: Secondary | ICD-10-CM | POA: Insufficient documentation

## 2018-10-20 DIAGNOSIS — D631 Anemia in chronic kidney disease: Secondary | ICD-10-CM | POA: Insufficient documentation

## 2018-10-20 DIAGNOSIS — Z90710 Acquired absence of both cervix and uterus: Secondary | ICD-10-CM | POA: Insufficient documentation

## 2018-10-20 DIAGNOSIS — K210 Gastro-esophageal reflux disease with esophagitis: Secondary | ICD-10-CM | POA: Insufficient documentation

## 2018-10-20 DIAGNOSIS — Z8249 Family history of ischemic heart disease and other diseases of the circulatory system: Secondary | ICD-10-CM | POA: Insufficient documentation

## 2018-10-20 DIAGNOSIS — N189 Chronic kidney disease, unspecified: Secondary | ICD-10-CM | POA: Insufficient documentation

## 2018-10-20 DIAGNOSIS — K838 Other specified diseases of biliary tract: Secondary | ICD-10-CM

## 2018-10-20 DIAGNOSIS — Z96652 Presence of left artificial knee joint: Secondary | ICD-10-CM | POA: Diagnosis not present

## 2018-10-20 DIAGNOSIS — M1990 Unspecified osteoarthritis, unspecified site: Secondary | ICD-10-CM | POA: Insufficient documentation

## 2018-10-20 DIAGNOSIS — E785 Hyperlipidemia, unspecified: Secondary | ICD-10-CM | POA: Diagnosis not present

## 2018-10-20 DIAGNOSIS — R932 Abnormal findings on diagnostic imaging of liver and biliary tract: Secondary | ICD-10-CM | POA: Diagnosis not present

## 2018-10-20 DIAGNOSIS — Z885 Allergy status to narcotic agent status: Secondary | ICD-10-CM | POA: Diagnosis not present

## 2018-10-20 DIAGNOSIS — M1712 Unilateral primary osteoarthritis, left knee: Secondary | ICD-10-CM | POA: Diagnosis not present

## 2018-10-20 HISTORY — DX: Other seasonal allergic rhinitis: J30.2

## 2018-10-20 HISTORY — DX: Palmar fascial fibromatosis (dupuytren): M72.0

## 2018-10-20 HISTORY — DX: Diverticulosis of intestine, part unspecified, without perforation or abscess without bleeding: K57.90

## 2018-10-20 HISTORY — DX: Metabolic encephalopathy: G93.41

## 2018-10-20 HISTORY — DX: Other chronic pain: G89.29

## 2018-10-20 HISTORY — DX: Personal history of colon polyps, unspecified: Z86.0100

## 2018-10-20 HISTORY — DX: Presence of spectacles and contact lenses: Z97.3

## 2018-10-20 HISTORY — DX: Low back pain, unspecified: M54.50

## 2018-10-20 HISTORY — PX: EUS: SHX5427

## 2018-10-20 HISTORY — DX: Personal history of colonic polyps: Z86.010

## 2018-10-20 HISTORY — DX: Unspecified hemorrhoids: K64.9

## 2018-10-20 HISTORY — DX: Vitamin D deficiency, unspecified: E55.9

## 2018-10-20 HISTORY — DX: Personal history of other diseases of the digestive system: Z87.19

## 2018-10-20 HISTORY — PX: BIOPSY: SHX5522

## 2018-10-20 HISTORY — DX: Other amnesia: R41.3

## 2018-10-20 HISTORY — DX: Unspecified hearing loss, unspecified ear: H91.90

## 2018-10-20 HISTORY — PX: ESOPHAGOGASTRODUODENOSCOPY (EGD) WITH PROPOFOL: SHX5813

## 2018-10-20 LAB — COMPREHENSIVE METABOLIC PANEL
ALT: 15 U/L (ref 0–44)
AST: 26 U/L (ref 15–41)
Albumin: 4.5 g/dL (ref 3.5–5.0)
Alkaline Phosphatase: 121 U/L (ref 38–126)
Anion gap: 10 (ref 5–15)
BUN: 20 mg/dL (ref 8–23)
CO2: 31 mmol/L (ref 22–32)
Calcium: 9.5 mg/dL (ref 8.9–10.3)
Chloride: 104 mmol/L (ref 98–111)
Creatinine, Ser: 0.86 mg/dL (ref 0.44–1.00)
GFR calc Af Amer: >60 mL/min (ref >60)
GFR calc non Af Amer: >60 mL/min (ref >60)
Glucose, Bld: 117 mg/dL — ABNORMAL HIGH (ref 70–99)
Potassium: 5.3 mmol/L — ABNORMAL HIGH (ref 3.5–5.1)
Sodium: 145 mmol/L (ref 135–145)
Total Bilirubin: 0.3 mg/dL (ref 0.3–1.2)
Total Protein: 8.2 g/dL — ABNORMAL HIGH (ref 6.5–8.1)

## 2018-10-20 SURGERY — UPPER ENDOSCOPIC ULTRASOUND (EUS) RADIAL
Anesthesia: Monitor Anesthesia Care

## 2018-10-20 MED ORDER — PROPOFOL 10 MG/ML IV BOLUS
INTRAVENOUS | Status: DC | PRN
  Administered 2018-10-20: 20 mg via INTRAVENOUS

## 2018-10-20 MED ORDER — LIDOCAINE 2% (20 MG/ML) 5 ML SYRINGE
INTRAMUSCULAR | Status: DC | PRN
  Administered 2018-10-20: 40 mg via INTRAVENOUS

## 2018-10-20 MED ORDER — SODIUM CHLORIDE 0.9 % IV SOLN: INTRAVENOUS | Status: DC

## 2018-10-20 MED ORDER — PROPOFOL 500 MG/50ML IV EMUL
INTRAVENOUS | Status: DC | PRN
  Administered 2018-10-20: 150 ug/kg/min via INTRAVENOUS

## 2018-10-20 MED ORDER — PROPOFOL 10 MG/ML IV BOLUS
INTRAVENOUS | Status: AC
  Filled 2018-10-20: qty 40

## 2018-10-20 MED ORDER — LACTATED RINGERS IV SOLN
INTRAVENOUS | Status: DC
  Administered 2018-10-20: 1000 mL via INTRAVENOUS

## 2018-10-20 NOTE — Transfer of Care (Signed)
Immediate Anesthesia Transfer of Care Note  Patient: Bianca Haley  Procedure(s) Performed: UPPER ENDOSCOPIC ULTRASOUND (EUS) RADIAL (N/A ) BIOPSY  Patient Location: PACU  Anesthesia Type:MAC  Level of Consciousness: awake, alert  and oriented  Airway & Oxygen Therapy: Patient Spontanous Breathing and Patient connected to nasal cannula oxygen  Post-op Assessment: Report given to RN and Post -op Vital signs reviewed and stable  Post vital signs: Reviewed and stable  Last Vitals:  Vitals Value Taken Time  BP    Temp    Pulse    Resp    SpO2      Last Pain:  Vitals:   10/20/18 0655  TempSrc: Oral  PainSc: 0-No pain         Complications: No apparent anesthesia complications

## 2018-10-20 NOTE — Op Note (Signed)
[facility] Patient Name: Bianca Haley Procedure Date: 10/20/2018 MRN: [id number] Attending MD: [name] , MD Date of Birth: [birth year] CSN: [id number] Age: 68 Admit Type: Outpatient Procedure:                Upper EUS Indications:              Incidental CBD, main pancreatic duct, GB dilation                            on 07/2018 CT chest, followed by CT abd and MR abd;                            no obvious stones or masses on any imaging. LFTS                            normal. CT scan 2016 showed essentially the same                            findings. Former alcoholic, no weight loss, no FH                            of pancreatic or biliary cancer. Providers:                [name], MD, Cleda Daub, RN, Elspeth Cho Tech., Technician, [facility],                            CRNA Referring MD:             Lucio Edward, MD Medicines:                Monitored Anesthesia Care Complications:            No immediate complications. Estimated blood loss:                            None. Estimated Blood Loss:     Estimated blood loss: none. Procedure:                Pre-Anesthesia Assessment:                           - Prior to the procedure, a History and Physical                            was performed, and patient medications and                            allergies were reviewed. The patient's tolerance of                            previous anesthesia was also reviewed. The risks  and benefits of the procedure and the sedation                            options and risks were discussed with the patient.                            All questions were answered, and informed consent                            was obtained. Prior Anticoagulants: The patient has                            taken no previous anticoagulant or antiplatelet                            agents. ASA Grade  Assessment: II - A patient with                            mild systemic disease. After reviewing the risks                            and benefits, the patient was deemed in                            satisfactory condition to undergo the procedure.                           After obtaining informed consent, the endoscope was                            passed under direct vision. Throughout the                            procedure, the patient's blood pressure, pulse, and                            oxygen saturations were monitored continuously. The                            [id number] ([id number]) Olympus Radial EUS was                            introduced through the mouth, and advanced to the                            second part of duodenum. The upper EUS was                            accomplished without difficulty. The patient                            tolerated the procedure well. Scope In: Scope Out: Findings:      ENDOSCOPIC FINDING: :      The  examined esophagus was endoscopically normal.      The entire examined stomach was endoscopically normal.      The periampullary duodenum was very well visualized with side viewing       duodenoscope. The mucosa was slightly irregular but not overtly       neoplastic (see images). Following EUS examination, mucosal biopsies       taken and sent to path.      ENDOSONOGRAPHIC FINDING: :      1. The CBD was diffusely dilated (95mm maximally) and tapered smoothly       into the head of pancreas, periampullary region. No CBD stones apparent.       More proximal extrahepatic biliary tree (CHD) was dilated to 31mm.      2. Main pancreatic duct was slightly dilated (18mm in head, 2.80mm in       tail).      3. Pancreatic parenchyma was normal; no discrete masses and no signs of       chronic pancreatitis.      4. Gall bladder was slightly distended; no obvious stones within.      5. Limited views of the liver, spleen, portal and splenic  vessels. Impression:               - Chronic GB, CBD and main pancreatic duct dilation                            (dating at least back to 2016 by CT) with normal                            LFTS. No clear etiology on this EUS examination.                            Slightly irregular periampullary mucosa was                            biopsied with forceps (I have low suspicion of                            neoplasm however). Moderate Sedation:      Not Applicable - Patient had care per Anesthesia. Recommendation:           - Discharge patient to home.                           - Would follow clinically, periodic LFTs Procedure Code(s):        --- Professional ---                           [phone], Esophagogastroduodenoscopy, flexible,                            transoral; with endoscopic ultrasound examination                            limited to the esophagus, stomach or duodenum, and  adjacent structures                           43239, Esophagogastroduodenoscopy, flexible,                            transoral; with biopsy, single or multiple Diagnosis Code(s):        --- Professional ---                           K83.8, Other specified diseases of biliary tract                           R93.2, Abnormal findings on diagnostic imaging of                            liver and biliary tract CPT copyright 2019 American Medical Association. All rights reserved. The codes documented in this report are preliminary and upon coder review may  be revised to meet current compliance requirements. [name], MD 10/20/2018 8:06:46 AM This report has been signed electronically. Number of Addenda: 0

## 2018-10-20 NOTE — Anesthesia Postprocedure Evaluation (Signed)
Anesthesia Post Note  Patient: Bianca Haley  Procedure(s) Performed: UPPER ENDOSCOPIC ULTRASOUND (EUS) RADIAL (N/A ) BIOPSY     Patient location during evaluation: PACU Anesthesia Type: MAC Level of consciousness: awake and alert Pain management: pain level controlled Vital Signs Assessment: post-procedure vital signs reviewed and stable Respiratory status: spontaneous breathing, nonlabored ventilation, respiratory function stable and patient connected to nasal cannula oxygen Cardiovascular status: stable and blood pressure returned to baseline Postop Assessment: no apparent nausea or vomiting Anesthetic complications: no    Last Vitals:  Vitals:   10/20/18 0810 10/20/18 0820  BP: (!) 146/74 (!) 161/77  Pulse: (!) 58 (!) 56  Resp: 16 16  Temp:    SpO2: 100% 93%    Last Pain:  Vitals:   10/20/18 0759  TempSrc: Other (Comment)  PainSc:                  Eben Choinski S

## 2018-10-20 NOTE — Anesthesia Preprocedure Evaluation (Signed)
Anesthesia Evaluation  Patient identified by MRN, date of birth, ID band Patient awake    Reviewed: Allergy & Precautions, NPO status , Patient's Chart, lab work & pertinent test results  Airway Mallampati: II  TM Distance: >3 FB Neck ROM: Full    Dental no notable dental hx.    Pulmonary neg pulmonary ROS,    Pulmonary exam normal breath sounds clear to auscultation       Cardiovascular hypertension, Normal cardiovascular exam Rhythm:Regular Rate:Normal     Neuro/Psych negative neurological ROS  negative psych ROS   GI/Hepatic Neg liver ROS, GERD  ,  Endo/Other  negative endocrine ROS  Renal/GU negative Renal ROS  negative genitourinary   Musculoskeletal negative musculoskeletal ROS (+)   Abdominal   Peds negative pediatric ROS (+)  Hematology negative hematology ROS (+)   Anesthesia Other Findings   Reproductive/Obstetrics negative OB ROS                             Anesthesia Physical Anesthesia Plan  ASA: II  Anesthesia Plan: MAC   Post-op Pain Management:    Induction: Intravenous  PONV Risk Score and Plan: 0  Airway Management Planned: Simple Face Mask  Additional Equipment:   Intra-op Plan:   Post-operative Plan:   Informed Consent: I have reviewed the patients History and Physical, chart, labs and discussed the procedure including the risks, benefits and alternatives for the proposed anesthesia with the patient or authorized representative who has indicated his/her understanding and acceptance.     Dental advisory given  Plan Discussed with: CRNA and Surgeon  Anesthesia Plan Comments:         Anesthesia Quick Evaluation

## 2018-10-20 NOTE — Interval H&P Note (Signed)
History and Physical Interval Note:  10/20/2018 7:07 AM  Bianca Haley  has presented today for surgery, with the diagnosis of abnormal pancreatic duct abnormal bile duct.  The various methods of treatment have been discussed with the patient and family. After consideration of risks, benefits and other options for treatment, the patient has consented to  Procedure(s): UPPER ENDOSCOPIC ULTRASOUND (EUS) RADIAL (N/A) as a surgical intervention.  The patient's history has been reviewed, patient examined, no change in status, stable for surgery.  I have reviewed the patient's chart and labs.  Questions were answered to the patient's satisfaction.     [name]

## 2018-10-20 NOTE — Discharge Instructions (Signed)
YOU HAD AN ENDOSCOPIC PROCEDURE TODAY: Refer to the procedure report and other information in the discharge instructions given to you for any specific questions about what was found during the examination. If this information does not answer your questions, please call [place] office at [phone] to clarify.   YOU SHOULD EXPECT: Some feelings of bloating in the abdomen. Passage of more gas than usual. Walking can help get rid of the air that was put into your GI tract during the procedure and reduce the bloating. If you had a lower endoscopy (such as a colonoscopy or flexible sigmoidoscopy) you may notice spotting of blood in your stool or on the toilet paper. Some abdominal soreness may be present for a day or two, also.  DIET: Your first meal following the procedure should be a light meal and then it is ok to progress to your normal diet. A half-sandwich or bowl of soup is an example of a good first meal. Heavy or fried foods are harder to digest and may make you feel nauseous or bloated. Drink plenty of fluids but you should avoid alcoholic beverages for 24 hours. If you had a esophageal dilation, please see attached instructions for diet.    ACTIVITY: Your care partner should take you home directly after the procedure. You should plan to take it easy, moving slowly for the rest of the day. You can resume normal activity the day after the procedure however YOU SHOULD NOT DRIVE, use power tools, machinery or perform tasks that involve climbing or major physical exertion for 24 hours (because of the sedation medicines used during the test).   SYMPTOMS TO REPORT IMMEDIATELY: A gastroenterologist can be reached at any hour. Please call [phone]  for any of the following symptoms:   Following upper endoscopy (EGD, EUS, ERCP, esophageal dilation) Vomiting of blood or coffee ground material  New, significant abdominal pain  New, significant chest pain or pain under the shoulder blades  Painful or  persistently difficult swallowing  New shortness of breath  Black, tarry-looking or red, bloody stools  FOLLOW UP:  If any biopsies were taken you will be contacted by phone or by letter within the next 1-3 weeks. Call [phone]  if you have not heard about the biopsies in 3 weeks.  Please also call with any specific questions about appointments or follow up tests.  

## 2018-10-24 ENCOUNTER — Encounter (HOSPITAL_COMMUNITY): Payer: Self-pay | Admitting: Gastroenterology

## 2018-10-25 DIAGNOSIS — M6281 Muscle weakness (generalized): Secondary | ICD-10-CM | POA: Diagnosis not present

## 2018-10-28 ENCOUNTER — Other Ambulatory Visit: Payer: Self-pay | Admitting: Internal Medicine

## 2018-10-28 DIAGNOSIS — M6281 Muscle weakness (generalized): Secondary | ICD-10-CM | POA: Diagnosis not present

## 2018-11-01 DIAGNOSIS — M6281 Muscle weakness (generalized): Secondary | ICD-10-CM | POA: Diagnosis not present

## 2018-11-03 DIAGNOSIS — M6281 Muscle weakness (generalized): Secondary | ICD-10-CM | POA: Diagnosis not present

## 2018-11-04 ENCOUNTER — Other Ambulatory Visit: Payer: Self-pay | Admitting: Internal Medicine

## 2018-11-08 DIAGNOSIS — M6281 Muscle weakness (generalized): Secondary | ICD-10-CM | POA: Diagnosis not present

## 2018-11-09 ENCOUNTER — Other Ambulatory Visit: Payer: Self-pay | Admitting: Internal Medicine

## 2018-11-16 DIAGNOSIS — M6281 Muscle weakness (generalized): Secondary | ICD-10-CM | POA: Diagnosis not present

## 2018-11-18 ENCOUNTER — Other Ambulatory Visit: Payer: Self-pay | Admitting: Internal Medicine

## 2018-11-18 DIAGNOSIS — M6281 Muscle weakness (generalized): Secondary | ICD-10-CM | POA: Diagnosis not present

## 2018-11-24 DIAGNOSIS — M6281 Muscle weakness (generalized): Secondary | ICD-10-CM | POA: Diagnosis not present

## 2018-11-28 DIAGNOSIS — M6281 Muscle weakness (generalized): Secondary | ICD-10-CM | POA: Diagnosis not present

## 2018-12-06 DIAGNOSIS — M6281 Muscle weakness (generalized): Secondary | ICD-10-CM | POA: Diagnosis not present

## 2018-12-09 DIAGNOSIS — M6281 Muscle weakness (generalized): Secondary | ICD-10-CM | POA: Diagnosis not present

## 2018-12-13 DIAGNOSIS — M6281 Muscle weakness (generalized): Secondary | ICD-10-CM | POA: Diagnosis not present

## 2018-12-15 DIAGNOSIS — M6281 Muscle weakness (generalized): Secondary | ICD-10-CM | POA: Diagnosis not present

## 2018-12-16 ENCOUNTER — Telehealth: Payer: Self-pay | Admitting: *Deleted

## 2018-12-16 NOTE — Telephone Encounter (Signed)
Paroxetine 40 mg PA initiated via CoverMyMeds. Key: Lawerance Sabal

## 2018-12-19 DIAGNOSIS — M6281 Muscle weakness (generalized): Secondary | ICD-10-CM | POA: Diagnosis not present

## 2018-12-19 NOTE — Telephone Encounter (Signed)
Received fax from [facility] stating PA for Paroxetine 40 mg is approved until 12/16/19.

## 2018-12-20 DIAGNOSIS — Z6825 Body mass index (BMI) 25.0-25.9, adult: Secondary | ICD-10-CM | POA: Diagnosis not present

## 2018-12-20 DIAGNOSIS — I10 Essential (primary) hypertension: Secondary | ICD-10-CM | POA: Diagnosis not present

## 2018-12-20 DIAGNOSIS — M5441 Lumbago with sciatica, right side: Secondary | ICD-10-CM | POA: Diagnosis not present

## 2018-12-20 DIAGNOSIS — M542 Cervicalgia: Secondary | ICD-10-CM | POA: Diagnosis not present

## 2018-12-20 DIAGNOSIS — M25561 Pain in right knee: Secondary | ICD-10-CM | POA: Diagnosis not present

## 2018-12-22 DIAGNOSIS — M6281 Muscle weakness (generalized): Secondary | ICD-10-CM | POA: Diagnosis not present

## 2018-12-27 ENCOUNTER — Encounter: Payer: Self-pay | Admitting: Neurology

## 2018-12-27 ENCOUNTER — Other Ambulatory Visit: Payer: Self-pay

## 2018-12-27 ENCOUNTER — Ambulatory Visit (INDEPENDENT_AMBULATORY_CARE_PROVIDER_SITE_OTHER): Payer: Medicare Other | Admitting: Neurology

## 2018-12-27 VITALS — BP 152/70 | HR 59 | Ht 65.5 in | Wt 156.0 lb

## 2018-12-27 DIAGNOSIS — R351 Nocturia: Secondary | ICD-10-CM

## 2018-12-27 DIAGNOSIS — R51 Headache: Secondary | ICD-10-CM

## 2018-12-27 DIAGNOSIS — G479 Sleep disorder, unspecified: Secondary | ICD-10-CM | POA: Diagnosis not present

## 2018-12-27 DIAGNOSIS — R519 Headache, unspecified: Secondary | ICD-10-CM

## 2018-12-27 DIAGNOSIS — R419 Unspecified symptoms and signs involving cognitive functions and awareness: Secondary | ICD-10-CM

## 2018-12-27 DIAGNOSIS — G4700 Insomnia, unspecified: Secondary | ICD-10-CM

## 2018-12-27 NOTE — Patient Instructions (Signed)
You had an initial consultation with the neuropsychologist on 06/30/2018.  We will try to get you evaluated with the cognitive testing with Dr. Sima Matas.  In addition, I would like to exclude obstructive sleep apnea as a potential cause of your sleep disruption and as a contributor to cognitive issues.  Please continue to try to stay active mentally and physically and try to hydrate well with water, 6 to 8 cups of water per day are recommended.  I will follow up with you routinely in 6 months, sooner if needed.

## 2018-12-27 NOTE — Progress Notes (Signed)
Subjective:    Patient ID: Bianca Haley is a 68 y.o. female.  HPI      Interim history:   Bianca Haley is a 68 year old right-handed woman with an underlying medical history of hypertension, history of migraines, reflux disease, low back pain, s/p surgery, on chronic narcotic pain medications, arthritis, s/p multiple LLE orthopedic surgeries, anxiety, depression and vitamin D deficiency, who presents for follow-up consultation of her memory loss. The patient is unaccompanied today. I last saw her on 06/28/2018, at which time she reported feeling stable.  She was on pain medication, was taking gabapentin at night only, 900 mg, her mood was stable.  She had moved into [facility].  I suggested we seek the consultation with neuropsychology for memory evaluation.   She met with Dr. Sima Matas for initial consultation on 06/30/2018.  She has not had the testing yet.  Of note, she was admitted to the hospital in the interim in March with altered mental status and fever and she was treated for suspected viral pneumonia.  She was discharged with a reduced dose of gabapentin. She had a COVID-19 test negative in June.  She had a GI procedure recently.  Today, 12/27/2018: She reports that when she moved to [facility] independent living in November 2019, she was not doing very well, she was having a lot of stress.  She has since then settled in well.  She has had some physical therapy, she has graduated that and recently the gym has reopened in her retirement community.  She has been trying to hydrate well.  She requires about 8 to 9 hours of sleep.  She does not sleep very well and currently takes trazodone 150 mg each night.  She also takes gabapentin.  She has trouble maintaining sleep.  She is not aware of any snoring.  She has had rare morning headaches and has nocturia typically in the early morning hours around 5 or 6 AM and then goes back to bed to start her day between 8 and 9.   Bedtime is around 10. Memory is stable, per her report.  Her Epworth sleepiness score is 11 out of 24, fatigue severity score is 28 out of 63.    The patient's allergies, current medications, family history, past medical history, past social history, past surgical history and problem list were reviewed and updated as appropriate.    Previously:  I first met her on 11/23/2017 at the request of her primary care physician, at which time she reported a several month history or 2 year history of short-term memory issues. Her MMSE was 25 at the time. I suggested workup in the form of blood tests, neuropsychological evaluation and brain scan. Her blood test showed normal RPR, TSH, B12, folate, CMP, A1c was in the prediabetes range at 5.9. She had a brain MRI with and without contrast on 12/28/2017 and I reviewed the results: IMPRESSION:    MRI brain (with and without) demonstrating: - Mild perisylvian atrophy. - Mild chronic small vessel ischemic disease.   We called her with her test results.      11/23/2017: (She) reports short term memory loss for the past few months and maybe a couple of years. Her daughter has voiced concern. Patient reports difficulty remembering names all her life. Mom had dementia in the context of [facility].  I reviewed your office note from 05/14/2017. She had a brain MRI with and without contrast on 09/27/2013 and I reviewed the results: IMPRESSION: 1.  No focal lesion along the planum sphenoidale. The abnormality on CT is likely partial voluming. 2. Normal MRI appearance of the brain for age. 3. Mild diffuse sinus disease as described. The left maxillary sinus is completely opacified.    She has a Hx of anxiety. She lost her first husband at age 89 from prostate cancer. She has one daughter and one son from her first husband, ages 60 and 106, daughter lives in [place] and has 2 kids, son lives in [place] state and has no children. Sister lives in [place]. She lives with  her long-term boyfriend whom I have seen before as well. She is a nonsmoker and does not drink alcohol, drinks caffeine in limitation, 1-2 cups in the mornings typically. She follows with pain management through her neurosurgeon's office. She is using her pain medication cautiously and only if needed. She is retired. She has a long-standing history of anxiety, dating back to even childhood as she recalls. She does not have a history of ADD or ADHD as far she knows. She had left foot surgery in March last year and needed subsequent surgeries due to infection and need for hardware removal and bone graft was placed eventually.  Her Past Medical History Is Significant For: Past Medical History:  Diagnosis Date  . Anemia    history of anemia  . Anxiety   . Arthritis   . Chronic low back pain   . Chronic renal failure    patient denies   . Diverticulosis   . Dupuytren contracture    Right hand, has received injection  . GERD (gastroesophageal reflux disease)   . H/O tinnitus    left  . Hearing loss    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  . Hemorrhoids   . History of colon polyps   . History of endometriosis   . History of hiatal hernia   . History of uterine fibroid   . History of UTI   . Hypertension   . Memory loss   . Metabolic encephalopathy    History of: resolved  . Migraine   . Recurrent pneumonia 07/2018  . Seasonal allergies   . Vitamin D deficiency   . Wears glasses     Her Past Surgical History Is Significant For: Past Surgical History:  Procedure Laterality Date  . ABDOMINAL HYSTERECTOMY    . BACK SURGERY    . BIOPSY  10/20/2018   Procedure: BIOPSY;  Surgeon: [name], MD;  Location: [facility];  Service: Endoscopy;;  . BLADDER SUSPENSION    . COLONOSCOPY  02/16/2008   normal  . DENTAL SURGERY    . ESOPHAGOGASTRODUODENOSCOPY (EGD) WITH PROPOFOL N/A 10/20/2018   Procedure: ESOPHAGOGASTRODUODENOSCOPY (EGD) WITH PROPOFOL;  Surgeon: [name], MD;  Location:  [facility];  Service: Endoscopy;  Laterality: N/A;  . EUS N/A 10/20/2018   Procedure: UPPER ENDOSCOPIC ULTRASOUND (EUS) RADIAL;  Surgeon: [name], MD;  Location: [facility];  Service: Endoscopy;  Laterality: N/A;  . FOOT SURGERY Left    x3  . laparoscopy     for evaluation of endometriosis  . TOTAL KNEE ARTHROPLASTY Left 07/15/2015   Procedure: TOTAL LEFT KNEE ARTHROPLASTY;  Surgeon: Paralee Cancel, MD;  Location: [facility];  Service: Orthopedics;  Laterality: Left;  . UPPER GI ENDOSCOPY      Her Family History Is Significant For: Family History  Problem Relation Age of Onset  . Emphysema Mother   . Heart disease Mother 55  MI    Her Social History Is Significant For: Social History   Socioeconomic History  . Marital status: Married    Spouse name: Not on file  . Number of children: 2  . Years of education: 69  . Highest education level: Not on file  Occupational History  . Occupation: Futures trader  Social Needs  . Financial resource strain: Not on file  . Food insecurity    Worry: Not on file    Inability: Not on file  . Transportation needs    Medical: Not on file    Non-medical: Not on file  Tobacco Use  . Smoking status: Never Smoker  . Smokeless tobacco: Never Used  Substance and Sexual Activity  . Alcohol use: No    Alcohol/week: 0.0 standard drinks    Comment: As of 07/2018, it is been several years since she drank any alcohol.  . Drug use: No  . Sexual activity: Yes    Partners: Male    Birth control/protection: Surgical  Lifestyle  . Physical activity    Days per week: Not on file    Minutes per session: Not on file  . Stress: Not on file  Relationships  . Social Herbalist on phone: Not on file    Gets together: Not on file    Attends religious service: Not on file    Active member of club or organization: Not on file    Attends meetings of clubs or organizations: Not on file    Relationship status: Not on file  Other  Topics Concern  . Not on file  Social History Narrative   HSG, [employer] BS interior design. Married July 18, 1977 - 35 years, her husband died in Jul 18, 1997.  She has been with her current, second husband since around July 18, 2008.  1 dtr - 07/18/82 - lawyer, 1 son 07-19-87. No h/o abuse.    Her Allergies Are:  Allergies  Allergen Reactions  . Penicillins Shortness Of Breath and Rash    Did it involve swelling of the face/tongue/throat, SOB, or low BP? No Did it involve sudden or severe rash/hives, skin peeling, or any reaction on the inside of your mouth or nose? Yes Did you need to seek medical attention at a hospital or doctor's office? No When did it last happen?10 + years If all above answers are "NO", may proceed with cephalosporin use.    [name] Morphine And Related Swelling    Sedation/Swelling described like edema/bloating/doesn't help the pain Morphine only; tolerates other opioids.   . Cefepime Rash    Reaction while taking both cefepime and vancomycin at [facility] (after 3 days)  . Vancomycin Rash    Reaction while taking both cefepime and vancomycin at [facility] (after 3 days)  :   Her Current Medications Are:  Outpatient Encounter Medications as of 12/27/2018  Medication Sig  . acetaminophen (TYLENOL) 650 MG CR tablet Take 1,300 mg by mouth every 8 (eight) hours as needed for pain.  [name] albuterol (VENTOLIN HFA) 108 (90 Base) MCG/ACT inhaler INHALE 2 PUFFS INTO THE LUNGS EVERY 4 HOURS AS NEEDED FOR WHEEZING OR SHORTNESS OF BREATH (Patient taking differently: Inhale 2 puffs into the lungs every 4 (four) hours as needed for wheezing or shortness of breath. )  . atenolol (TENORMIN) 100 MG tablet TAKE 1 TABLET(100 MG) BY MOUTH EVERY DAY (Patient taking differently: Take 100 mg by mouth daily. )  . Carboxymethylcellulose Sodium (RETAINE [facility] OP) Place 1 application into both eyes at  bedtime.  . Cholecalciferol (VITAMIN D) 2000 units tablet Take 2,000 Units by mouth at bedtime.   . diazepam (VALIUM) 5 MG tablet TAKE 1 TABLET  BY MOUTH AS NEEDED FOR ANXIETY OR MUSCLE SPASMS  . diltiazem (CARDIZEM CD) 180 MG 24 hr capsule TAKE 1 CAPSULE(180 MG) BY MOUTH DAILY (Patient taking differently: Take 180 mg by mouth daily. )  . fluticasone (FLONASE) 50 MCG/ACT nasal spray Place 2 sprays into both nostrils daily as needed for allergies or rhinitis.  . Multiple Vitamin (MULTIVITAMIN WITH MINERALS) TABS tablet Take 2 tablets by mouth daily.   [name] oxymorphone (OPANA) 10 MG tablet Take 10 mg by mouth 2 (two) times daily as needed for pain.   . pantoprazole (PROTONIX) 40 MG tablet TAKE 1 TABLET(40 MG) BY MOUTH TWICE DAILY BEFORE A MEAL  . PARoxetine (PAXIL) 40 MG tablet TAKE 1 TABLET(40 MG) BY MOUTH TWICE DAILY  . Polyethyl Glycol-Propyl Glycol (SYSTANE OP) Place 1 drop into both eyes daily as needed (dry eyes).  . polyethylene glycol (MIRALAX / GLYCOLAX) packet Take 17 g by mouth 2 (two) times daily. (Patient taking differently: Take 17 g by mouth daily as needed (constipation). Mix in 8 oz liquid and drink)  . potassium chloride (K-DUR) 10 MEQ tablet TAKE 1 TABLET(10 MEQ) BY MOUTH DAILY  . Probiotic CAPS Take 1 capsule by mouth daily.  . rosuvastatin (CRESTOR) 5 MG tablet TAKE 1 TABLET(5 MG) BY MOUTH DAILY (Patient taking differently: Take 5 mg by mouth daily. )  . SUMAtriptan (IMITREX) 100 MG tablet TAKE 1 TABLET BY MOUTH DAILY AS NEEDED FOR MIGRAINE. MAY REPEAT IN 2 HOURS IF HEADACHE PERSISTS OR RECURS (Patient taking differently: Take 100 mg by mouth every 2 (two) hours as needed for migraine. )  . traZODone (DESYREL) 150 MG tablet Take 1 tablet (150 mg total) by mouth at bedtime.  . vitamin B-12 (CYANOCOBALAMIN) 500 MCG tablet Take 500 mcg by mouth daily.  [name] gabapentin (NEURONTIN) 300 MG capsule Take 1 capsule (300 mg total) by mouth 3 (three) times daily for 30 days. (Patient taking differently: Take 600-900 mg by mouth at bedtime. )   No facility-administered encounter medications on file as of 12/27/2018.   :  Review of  Systems:  Out of a complete 14 point review of systems, all are reviewed and negative with the exception of these symptoms as listed below:  Review of Systems  Neurological:       Pt presents today to follow up on her memory. Pt did not complete neuropsych testing because he was busy moving. Pt is complaining of sleep problems.    Objective:  Neurological Exam  Physical Exam Physical Examination:   Vitals:   12/27/18 1309  BP: (!) 152/70  Pulse: (!) 59    General Examination: The patient is a very pleasant 68 y.o. female in no acute distress. She appears well-developed and well-nourished and well groomed.   HEENT:Normocephalic, atraumatic, pupils are equal, round and reactive to light and accommodation. Corrective eye glasses in place. Extraocular tracking is good without limitation to gaze excursion or nystagmus noted. Normal smooth pursuit is noted. Hearing is grossly intact. Face is symmetric with normal facial animation and normal facial sensation. Speech is clear with no dysarthria noted. There is no hypophonia. There is no lip, neck/head, jaw or voice tremor. Neck is supple with full range of passive and active motion. There are no carotid bruits on auscultation.   Chest:Clear to auscultation without wheezing, rhonchi or crackles noted.  Heart:S1+S2+0, regular and normal without murmurs, rubs or gallops noted.   Abdomen:Soft, non-tender and non-distended with normal bowel sounds appreciated on auscultation.  Extremities:There is no obvious edema.   Skin: Warm and dry without trophic changes noted.  Musculoskeletal: exam reveals no obvious joint deformities, tenderness or joint swelling or erythema.   Neurologically:  Mental status: The patient is awake, alert and orientedexcept for exact date.Herimmediate and remote memory, attention, language skills and fund of knowledge are fairlyappropriate. There is no evidence of aphasia, agnosia, apraxia or anomia.  Speech is clear with normal prosody and enunciation. Thought process is linear. Mood isnormaland affect is normal.  On7/23/2019: MMSE: 25/30,she declined serial sevens and could not fully spell the word world backwards. She was not fully oriented to exact date. CDT: 4/4, AFT: 10/min.  On 06/28/2018: MMSE: 29/30, she declined serial 7s, CDT: 3/4, AFT: 13/min.  On 12/27/2018: MMSE: 27/30. CDT: 4/4, AFT: 14/min.  Cranial nerves II - XII are as described above under HEENT exam.  Motor exam: Normal bulk, strength and tone is noted. There is no tremor. Fine motor skills and coordination: grossly intact.  Cerebellar testing: No dysmetria or intention tremor. There is no truncal or gait ataxia.  Sensory exam: intact to light touch in the upper and lower extremities.  Gait, station and balance:Shestands easily. No veering to one side is noted. No leaning to one side is noted. Posture isslightly stooped, mild scoliosis possibly. She walks cautiously and slowly.  Assessmentand Plan:  In summary,Bianca L Croftis a very pleasant 66 year oldfemalewith an underlying mecal history of hypertension, history of migraines, reflux disease, back pain, on chronic narcotic pain medications, arthritis, anxiety, depression and vitamin D deficiency, whopresents for follow up consultation of her memory loss. Findings are generally quite benign, her memory loss is in the mild range, and scores are stable. She had neuropsychological consultation with Dr. Sima Matas in February and is advised to try to schedule for the testing appointment whenever possible.  In addition, she has had sleep difficulties.  I would like to rule out obstructive sleep apnea as a potential cause of her cognitive complaints as sleep apnea can cause issues with memory and focus as well.  She is agreeable.  To that end, we will seek sleep study testing and complete her neuropsychological evaluation with Dr. Sima Matas if possible.  She is  advised to continue to maintain a healthy lifestyle, try to stay active mentally and physically and stay well-hydrated.  She is advised to routinely follow-up in 6 months, sooner if needed.  I answered all her questions today and she was in agreement.  I spent 25 minutes in total face-to-face time with the patient, more than 50% of which was spent in counseling and coordination of care, reviewing test results, reviewing medication and discussing or reviewing the diagnosis of memory loss, its prognosis and treatment options. Pertinent laboratory and imaging test results that were available during this visit with the patient were reviewed by me and considered in my medical decision making (see chart for details).

## 2019-01-06 ENCOUNTER — Encounter: Payer: Self-pay | Admitting: Psychology

## 2019-01-06 ENCOUNTER — Encounter: Payer: Medicare Other | Attending: Internal Medicine | Admitting: Psychology

## 2019-01-06 ENCOUNTER — Other Ambulatory Visit: Payer: Self-pay

## 2019-01-06 DIAGNOSIS — R419 Unspecified symptoms and signs involving cognitive functions and awareness: Secondary | ICD-10-CM | POA: Insufficient documentation

## 2019-01-06 DIAGNOSIS — R351 Nocturia: Secondary | ICD-10-CM | POA: Diagnosis not present

## 2019-01-06 DIAGNOSIS — G479 Sleep disorder, unspecified: Secondary | ICD-10-CM | POA: Insufficient documentation

## 2019-01-06 DIAGNOSIS — R413 Other amnesia: Secondary | ICD-10-CM

## 2019-01-06 DIAGNOSIS — R51 Headache: Secondary | ICD-10-CM | POA: Insufficient documentation

## 2019-01-06 NOTE — Progress Notes (Signed)
The patient arrived on time to her 13:00 testing appointment, which lasted 210 minutes.  Behavioral Observations:  Appearance: Casually and appropriately dressed with good hygiene. Gait: Ambulated independently without assistance.  Speech:  Coherent, normal rate, tone & volume Thought process:  Organized, logical, & linear.  Mood/Affect: Slightly anxious but mostly euthymic, appropriate  Interpersonal: Polite and appropriate. Orientation: Oriented x 4 Effort/Motivation: Excellent   She had mild difficulty hearing some test items and required questions (e.g. word problems) to be repeated. She exhibited good frustration tolerance on questions she did not know or tasks that were more difficult. She was cooperative with all assigned tasks and persisted well overall (e.g. did not require many breaks).   Tests Administered: . Animal Naming  . [place] (BNT) . Controlled Oral Word Association Test (COWAT) . Clock Drawing Test . Wechsler Adult Intelligence Scale, 4th Edition (WAIS-IV) . Wechsler Memory Scale, 4th edition, Older Adult Battery (WMS-IV-OA)  Results:  Animal Naming (Total=15, T=41, 18th %)  [place] Naming Test (Total=55/60, T=46, 34%)   Clock Drawing Test (10/10; WNL)  COWAT (FAS Total=29, T=40, 16th)  WAIS-IV  Composite Score Summary  Scale Sum of Scaled Scores Composite Score Percentile Rank 95% Conf. Interval Qualitative Description  Verbal Comprehension 32 VCI 103 58 97-109 Average  Perceptual Reasoning 24 PRI 88 21 82-95 Low Average  Working Memory 18 WMI 95 37 89-102 Average  Processing Speed 15 PSI 86 18 79-96 Low Average  Full Scale 89 FSIQ 92 30 88-96 Average  General Ability 56 GAI 96 39 91-101 Average   Index Level Discrepancy Comparisons  Comparison Score 1 Score 2 Difference Critical Value .05 Significant Difference Y/N Base Rate by Overall Sample  VCI - PRI 103 88 15 8.31 Y 13.0  VCI - WMI 103 95 8 9.30 N 27.1  VCI - PSI 103 86 17 10.19  Y 14.6  PRI - WMI 88 95 -7 10.18 N 31.8  PRI - PSI 88 86 2 11.00 N 45.0  WMI - PSI 95 86 9 11.76 N 26.2  FSIQ - GAI 92 96 -4 3.61 Y 23.8   Differences Between Subtest and Overall Mean of Subtest Scores  Subtest Subtest Scaled Score Mean Scaled Score Difference Critical Value .05 Strength or Weakness Base Rate  Block Design 8 8.90 -0.90 2.85  >25%  Similarities 12 8.90 3.10 2.82 S 10-15%  Digit Span 9 8.90 0.10 2.22  >25%  Matrix Reasoning 8 8.90 -0.90 2.54  >25%  Vocabulary 12 8.90 3.10 2.03 S 5-10%  Arithmetic 9 8.90 0.10 2.73  >25%  Symbol Search 7 8.90 -1.90 3.42  >25%  Visual Puzzles 8 8.90 -0.90 2.71  >25%  Information 8 8.90 -0.90 2.19  >25%  Coding 8 8.90 -0.90 2.97  >25%   Index Score Summary  Index Sum of Scaled Scores Index Score Percentile Rank 95% Confidence Interval Qualitative Descriptor  Auditory Memory (AMI) 50 115 84 108-120 High Average  Visual Memory (VMI) 21 102 55 97-107 Average  Immediate Memory (IMI) 37 115 84 108-120 High Average  Delayed Memory (DMI) 34 108 70 100-115 Average   Primary Subtest Scaled Score Summary  Subtest Domain Raw Score Scaled Score Percentile Rank  Logical Memory I AM 40 14 91  Logical Memory II AM 23 12 75  Verbal Paired Associates I AM 29 13 84  Verbal [employer] II AM 7 11 63  Visual Reproduction I VM 29 10 50  Visual Reproduction II VM 20 11 63  Symbol Span VWM 18 11 63   PROCESS SCORE CONVERSIONS  Auditory Memory Process Score Summary  Process Score Raw Score Scaled Score Percentile Rank Cumulative Percentage (Base Rate)  LM II Recognition 20 - - >75%  VPA II Recognition 29 - - >75%   Visual Memory Process Score Summary  Process Score Raw Score Scaled Score Percentile Rank Cumulative Percentage (Base Rate)  VR II Recognition 6 - - >75%

## 2019-01-07 ENCOUNTER — Other Ambulatory Visit: Payer: Self-pay | Admitting: Internal Medicine

## 2019-01-15 ENCOUNTER — Ambulatory Visit (INDEPENDENT_AMBULATORY_CARE_PROVIDER_SITE_OTHER): Payer: Medicare Other | Admitting: Neurology

## 2019-01-15 DIAGNOSIS — G4734 Idiopathic sleep related nonobstructive alveolar hypoventilation: Secondary | ICD-10-CM

## 2019-01-15 DIAGNOSIS — G4720 Circadian rhythm sleep disorder, unspecified type: Secondary | ICD-10-CM

## 2019-01-15 DIAGNOSIS — R351 Nocturia: Secondary | ICD-10-CM

## 2019-01-15 DIAGNOSIS — R519 Headache, unspecified: Secondary | ICD-10-CM

## 2019-01-15 DIAGNOSIS — G479 Sleep disorder, unspecified: Secondary | ICD-10-CM

## 2019-01-15 DIAGNOSIS — G4700 Insomnia, unspecified: Secondary | ICD-10-CM

## 2019-01-15 DIAGNOSIS — R419 Unspecified symptoms and signs involving cognitive functions and awareness: Secondary | ICD-10-CM

## 2019-01-16 ENCOUNTER — Encounter (HOSPITAL_BASED_OUTPATIENT_CLINIC_OR_DEPARTMENT_OTHER): Payer: Medicare Other | Admitting: Psychology

## 2019-01-16 ENCOUNTER — Other Ambulatory Visit: Payer: Self-pay

## 2019-01-16 DIAGNOSIS — R51 Headache: Secondary | ICD-10-CM | POA: Diagnosis not present

## 2019-01-16 DIAGNOSIS — G479 Sleep disorder, unspecified: Secondary | ICD-10-CM | POA: Diagnosis not present

## 2019-01-16 DIAGNOSIS — R419 Unspecified symptoms and signs involving cognitive functions and awareness: Secondary | ICD-10-CM | POA: Diagnosis not present

## 2019-01-16 DIAGNOSIS — R413 Other amnesia: Secondary | ICD-10-CM

## 2019-01-16 DIAGNOSIS — R351 Nocturia: Secondary | ICD-10-CM | POA: Diagnosis not present

## 2019-01-16 NOTE — Progress Notes (Signed)
Patient:  Bianca Haley   DOB: Sep 16, 1950  MR Number: [id number]  Location: [facility] PHYSICAL MEDICINE AND REHABILITATION [address], [id number] [place] [id number] Dept: [phone]  Start: 3 PM End: 4 PM  Provider/Observer:     Edgardo Roys PsyD  Chief Complaint:      Chief Complaint  Patient presents with   Memory Loss    Reason For Service:      Bianca Haley is a 68 year old female referred by Dr. Rexene Alberts for neuropsychological evaluation due to diagnostic concerns related to her experiencing increasing short-term memory issues.    The patient reports that her daughter was pushing for this evaluation as a daughter has told the patient the daughter has seen significant worsening of short-term memory loss over the past year.  The patient reports that she has begun using calendars for appointments due to some changes in memory.  The patient reports that she has just moved to a graduated care facility not due to issues per se but to have the availability of increasing care over the years.  She reports that her husband has having cognitive difficulties.  The patient denies any geographic disorientation and denies any observed changes in expressive language.  The patient reports that her mother passed away at 55 and had some memory loss due to hydrocephalus and was treated with a shunt.  The patient recently had an MRI of her brain done suggesting mild parasylvian atrophy and mild chronic small vessel ischemic disease.  The patient reports that she has noted little change in her short-term memory but greater concerns are being reported to her by her daughter.  The patient denies any geographic disorientation or changes in expressive language.  Behavioral Observations: Appearance:Casually and appropriately dressed with good hygiene. Gait:Ambulated independently without assistance.  Speech:  Coherent, normal rate, tone & volume Thought process: Organized, logical, & linear.  Mood/Affect:Slightly anxious but mostly euthymic, appropriate  Interpersonal: Polite and appropriate. Orientation: Oriented x 4 Effort/Motivation: Excellent   She had mild difficulty hearing some test items and required questions (e.g. word problems) to be repeated. She exhibited good frustration tolerance on questions she did not know or tasks that were more difficult. She was cooperative with all assigned tasks and persisted well overall (e.g. did not require many breaks).   Tests Administered:  Animal Naming   [place] (BNT)  Controlled Oral Word Association Test (COWAT)  Clock Drawing Test  Wechsler Adult Intelligence Scale, 4th Edition (WAIS-IV)  Wechsler Memory Scale, 4th edition, Older Adult Battery (WMS-IV-OA)  Test Results:   The patient approach the current testing situation environment in a straightforward manner and logical manner and neither attempted to exaggerate or minimize any symptomatology or difficulty.  The patient clearly appeared to try her hardest and this does appear to be a fair and valid assessment of the patient's current cognitive functioning.  Composite Score Summary  Scale Sum of Scaled Scores Composite Score Percentile Rank 95% Conf. Interval Qualitative Description  Verbal Comprehension 32 VCI 103 58 97-109 Average  Perceptual Reasoning 24 PRI 88 21 82-95 Low Average  Working Memory 18 WMI 95 37 89-102 Average  Processing Speed 15 PSI 86 18 79-96 Low Average  Full Scale 89 FSIQ 92 30 88-96 Average  General Ability 56 GAI 96 39 91-101 Average   Initially, the patient was administered the Wechsler Adult Intelligence Scale-IV.  The patient produced a full scale IQ  score of 92 which falls at the 30th percentile and is in the average range of overall functioning relative to a normative comparison group.  This global measure of current cognitive  functioning does appear to be below predictions of where she should be based on premorbid factors such as education and occupational history.  I would predict that she should have been performing somewhere around 105-115 as far as her full-scale IQ score.  The patient's general abilities index score was also calculated.  This is a measure that reduces the reliance on measures letter more vulnerable to acute changes such as working memory and information processing speed.  The patient produced a general abilities index score of 96 which falls at the 39th percentile and is also in the average range and likely represents a 10-20 point performance below premorbid expectations.  Verbal Comprehension Subtests Summary  Subtest Raw Score Scaled Score Percentile Rank Reference Group Scaled Score SEM  Similarities 27 12 75 11 0.95  Vocabulary 44 12 75 13 0.67  Information 11 8 25 9  0.73    The patient produced a verbal comprehension index score of 103 which falls at the 58th percentile and is in the average range.  This measure is closer to predicted levels based on her educational and occupational history.  The patient did show some scatter and overall measures making up this index where she produced performances in the high average range with regard to her verbal reasoning and problem-solving abilities, vocabulary knowledge with average performance related to her general fund of information.    Perceptual Reasoning Subtests Summary  Subtest Raw Score Scaled Score Percentile Rank Reference Group Scaled Score SEM  Block Design 24 8 25 6  0.99  Matrix Reasoning 9 8 25 4  0.90  Visual Puzzles 9 8 25 6  0.99  (Picture Completion) 11 11 63 8 0.99   The patient produced a perceptual reasoning index score of 88 which falls at the 21st percentile and is in the low average range.  There was general consistency and measures where the patient produced average range performances on measures of visual analysis and visual  organizational abilities, visual reasoning and problem-solving, visual estimation and judgment as well as the ability to identify anomalies within a visual gestalt.  Clock Drawing Test (10/10; WNL): The patient was also administered the clock drawing test to further analyze her visual-spatial and constructional abilities.  The patient performed within normal limits getting 10 of 10 items correct showed no visual-spatial constructional deficits on this measure.  Working Doctor, general practice Raw Score Scaled Score Percentile Rank Reference Group Scaled Score SEM  Digit Span 22 9 37 7 0.73  Arithmetic 12 9 37 9 1.20    The patient produced a working memory index score of 95 which falls at the 37th percentile and is in the average range.  There was consistency on subtest making up this measure following in the average range.  The patient showed adequate auditory encoding and the ability to process this initially encoded information.   Processing Speed Subtests Summary  Subtest Raw Score Scaled Score Percentile Rank Reference Group Scaled Score SEM  Symbol Search 16 7 16 4  1.12  Coding 40 8 25 5  1.12     The patient produced a processing speed index score of 86 which falls at the 18th percentile and is in the low average range.  This pattern does suggest some weakness with regard to visual scanning, visual searching and overall speed of mental  operations relative to premorbid expectations.    Index Score Summary  Index Sum of Scaled Scores Index Score Percentile Rank 95% Confidence Interval Qualitative Descriptor  Auditory Memory (AMI) 50 115 84 108-120 High Average  Visual Memory (VMI) 21 102 55 97-107 Average  Immediate Memory (IMI) 37 115 84 108-120 High Average  Delayed Memory (DMI) 34 108 70 100-115 Average   The patient was then administered the Wechsler Memory Scale-IV for older adults.  The patient did show some significant difference between auditory versus visual  memory components where she produced auditory memory index score of 115 which falls at the 84th percentile and is in the high average range.  Visual memory performance produced an index score of 102 which falls in the average range.  This is a 13 point difference that it is a significant difference between auditory versus visual memory.  Breaking down immediate versus delayed memory functions the patient produced an immediate memory index score of 115 which falls at the 84th percentile and is in the high average range.  This performance is consistent with premorbid estimations.  The patient produced a delayed memory index score of 108 which falls at the [facility] and is in the average range.  These performances suggest that once the information is encoded it is available for organization, storage and later retrieval.  There was no significant deterioration of performance as a function of time and the patient was able to effectively recall most of the information that she was able to recall after brief period of delay.   ABILITY-MEMORY ANALYSIS  Ability Score:  GAI: 96 Date of Testing:  WAIS-IV; WMS-IV 2019/01/06  Predicted Difference Method   Index Predicted WMS-IV Index Score Actual WMS-IV Index Score Difference Critical Value  Significant Difference Y/N Base Rate  Auditory Memory 98 115 -17 10.41 Y 10%  Visual Memory 98 102 -4 7.89 N   Immediate Memory 97 115 -18 9.97 Y 5-10%  Delayed Memory 98 108 -10 12.33 N   Statistical significance (critical value) at the .01 level.    To further analyze the patient's memory and learning capacity we perform the ability-memory analysis where the patient's general abilities index score is used to produce a predicted score on various memory indices.  This predictive scores then compared to the actual achieved score to further determine whether there are objective findings of memory deficits.  Overall, the patient performed consistent with to  significantly better than predicted levels based on her general abilities index score.  The patient's performance on visual memory was predicted to be a 98 and she produced an achieve score of 102 which is a four-point difference and not statistically significant difference.  The patient also produced a less than significant difference between delayed memory prediction and achieved score.  However, the patient did much better on both auditory memory index score and immediate memory index score than predicted levels and her performances were well within normative expectations.  Expressive language functions:   Animal Naming (Total=15, T=41, 18th %)  [place] Naming Test (Total=55/60, T=46, 34%)   COWAT (FAS Total=29, T=40, 16th)  The patient was given a series of various expressive language measures to objectively assess both her verbal fluency, word finding abilities as well as challenge naming types of test.  The patient generally performed in the lower end of the average to average range.  The patient did better on challenge/targeted naming where there was a cueing effect having the patient shown a picture of a  common object in her coming up with the name.  On both of the self generated naming task including animal naming and controlled oral Word Association test the patient performed in the 16th the 18th percentile which is below predicted levels to some degree but not to a significant degree and only suggest some mild difficulties with verbal fluency and word finding.  Summary of Results:   Overall, there were no significant findings of neuropsychological or cognitive deficits although the patient did perform from a global standpoint below predicted levels.  This would indicate some mild weaknesses that were primarily identified for issues related to information processing speed and mild weaknesses with regard to visual-spatial and visual analysis/visual reasoning abilities.  There were also some  mild issues with verbal fluency but no indications of other significant expressive language deficits and the patient did quite well on targeted naming and word finding.  The patient did fine on measures of verbal comprehension measures.  The patient also did quite well on measures of auditory learning and memory.  The patient showed normal auditory encoding abilities and high average functions with regard to auditory learning and memory both immediate and delayed.  The patient did not do quite as well on visual memory task but they were still performed in the average range with no indication of significant deficits with regard to visual memory and learning.  Impression/Diagnosis:   Overall, the results of the current neuropsychological evaluation are not indicative of any significant cognitive deficits or significant deterioration in overall neuropsychological status.  The patient showed some mild weaknesses relative to premorbid estimations for measures of visual analysis and visual reasoning and some mild weaknesses with regard to overall information processing speed.  However, she did quite well on measures of auditory learning and memory, targeted naming abilities and visual constructional abilities.  This pattern is not consistent with those typically seen with degenerative dementias but the overall slowing of information processing time and potentially the subjective reports by her daughter regarding difficulty with free recall of information could be explained by the mild chronic small vessel disease and mild cortical atrophy.  While it is possible that we are very early in the assessment process for a particular degenerative dementia beyond those due to mild small vessel disease and aging it would be important for repeat testing in approximately 9 months to be able to make more sensitive direct comparisons to the patient's own performance.  Again, however, there are no clear indications of any  significant cognitive deficits or memory deficits beyond mild difficulties in information processing speed and visual reasoning/visual analysis functions.  I will provide feedback regarding the current results to the patient.  While the appointment for this feedback is not until November we will try to move this appointment up sooner.  I will also make sure that this evaluation is available for the patient and her my chart.  Diagnosis:    Axis I: Age-related memory disorder   Ilean Skill, Psy.D. Neuropsychologist        WAIS-IV Wechsler Adult Intelligence Scale-Fourth Edition Score Report   Examinee Name Bianca Haley  Date of Report 2019/01/16  Justice Rocher [id number]  Years of Education   Date of Birth 08-Nov-1950  Primary Language   Gender Female  Handedness   Race/Ethnicity   Examiner Name Ilean Skill  Date of Testing 2019/01/06  Age at Testing 75 years 4 months  Retest? No   Comments: Composite Score Summary  Scale Sum of Scaled Scores Composite Score  Percentile Rank 95% Conf. Interval Qualitative Description  Verbal Comprehension 32 VCI 103 58 97-109 Average  Perceptual Reasoning 24 PRI 88 21 82-95 Low Average  Working Memory 18 WMI 95 37 89-102 Average  Processing Speed 15 PSI 86 18 79-96 Low Average  Full Scale 89 FSIQ 92 30 88-96 Average  General Ability 56 GAI 96 39 91-101 Average  Confidence Intervals are based on the Overall Average SEMs. The [facility] is an optional composite summary score that is less sensitive to the influence of working memory and processing speed. Because working memory and processing speed are vital to a comprehensive evaluation of cognitive ability, it should be noted that the [facility] does not have the breadth of construct coverage as the FSIQ.   ANALYSIS   Index Level Discrepancy Comparisons  Comparison Score 1 Score 2 Difference Critical Value .05 Significant Difference Y/N Base Rate by Overall Sample  VCI - PRI 103 88 15 8.31 Y  13.0  VCI - WMI 103 95 8 9.30 N 27.1  VCI - PSI 103 86 17 10.19 Y 14.6  PRI - WMI 88 95 -7 10.18 N 31.8  PRI - PSI 88 86 2 11.00 N 45.0  WMI - PSI 95 86 9 11.76 N 26.2  FSIQ - GAI 92 96 -4 3.61 Y 23.8  Base Rate by Overall Sample. Statistical significance (critical value) at the .05 level.  Verbal Comprehension Subtests Summary  Subtest Raw Score Scaled Score Percentile Rank Reference Group Scaled Score SEM  Similarities 27 12 75 11 0.95  Vocabulary 44 12 75 13 0.67  Information 11 8 25 9  0.73   Perceptual Reasoning Subtests Summary  Subtest Raw Score Scaled Score Percentile Rank Reference Group Scaled Score SEM  Block Design 24 8 25 6  0.99  Matrix Reasoning 9 8 25 4  0.90  Visual Puzzles 9 8 25 6  0.99  (Picture Completion) 11 11 63 8 0.99   Working Doctor, general practice Raw Score Scaled Score Percentile Rank Reference Group Scaled Score SEM  Digit Span 22 9 37 7 0.73  Arithmetic 12 9 37 9 1.20   Processing Speed Subtests Summary  Subtest Raw Score Scaled Score Percentile Rank Reference Group Scaled Score SEM  Symbol Search 16 7 16 4  1.12  Coding 40 8 25 5  1.12   Subtest Level Discrepancy Comparisons  Subtest Comparison Score 1 Score 2 Difference Critical Value .05 Significant Difference Y/N Base Rate  Digit Span - Arithmetic 9 9 0 2.57 N   Symbol Search - Coding 7 8 -1 3.41 N 40.10     DETERMINING STRENGTHS AND WEAKNESSES   Differences Between Subtest and Overall Mean of Subtest Scores  Subtest Subtest Scaled Score Mean Scaled Score Difference Critical Value .05 Strength or Weakness Base Rate  Block Design 8 8.90 -0.90 2.85  >25%  Similarities 12 8.90 3.10 2.82 S 10-15%  Digit Span  8.90 0.10 2.22  >25%  Matrix Reasoning 8 8.90 -0.90 2.54  >25%  Vocabulary 12 8.90 3.10 2.03 S 5-10%  Arithmetic 9 8.90 0.10 2.73  >25%  Symbol Search 7 8.90 -1.90 3.42  >25%  Visual Puzzles 8 8.90 -0.90 2.71  >25%  Information 8 8.90 -0.90 2.19  >25%  Coding 8 8.90  -0.90 2.97  >25%  Overall: Mean = 8.90, Scatter =  5, Base rate =  85.4 Base Rate for Intersubtest Scatter is reported for 10 Subtests. Statistical significance (critical value) at the .05 level.   PROCESS ANALYSIS  Perceptual Reasoning Process Score Summary  Process Score Raw Score Scaled Score Percentile Rank SEM  Block Design No Time Bonus 24 8 25  1.04    Working Memory Process Score Summary  Process Score Raw Score Scaled Score Percentile Rank Base Rate SEM  Digit Span Forward 8 8 25  -- 1.04  Digit Span Backward 6 8 25  -- 1.37  Digit Span Sequencing 8 11 63 -- 1.12  Longest Digit Span Forward 6 -- -- 68.0 --  Longest Digit Span Backward 3 -- -- 95.0 --  Longest Digit Span Sequence 6 -- -- 52.0 --    Process Level Discrepancy Comparisons  Process Comparison Score 1 Score 2 Difference Critical Value .05 Significant Difference Y/N Base Rate  Block Design - Block Design No Time Bonus 8 8 0 3.08 N   Digit Span Forward - Digit Span Backward 8 8 0 3.65 N   Digit Span Forward - Digit Span Sequencing 8 11 -3 3.60 N 20.9  Digit Span Backward - Digit Span Sequencing 8 11 -3 3.56 N 17.3  Longest Digit Span Forward - Longest Digit Span Backward 6 3 3  -- -- 34.0  Longest Digit Span Forward - Longest Digit Span Sequence 6 6 0 -- --   Longest Digit Span Backward - Longest Digit Span Sequence 3 6 -3 -- -- 13.0  Statistical significance (critical value) at the .05 level.   Raw Scores  Subtest Score Range Raw Score  Process Score Range Raw Score  Block Design 0-66 24  Block Design No Time Bonus 0-48 24  Similarities 0-36 27  Digit Span Forward 0-16 8  Digit Span 0-48 22  Digit Span Backward 0-16 6  Matrix Reasoning 0-26 9  Digit Span Sequencing 0-16 8  Vocabulary 0-57 44  Longest Digit Span Forward 0, 2-9 6  Arithmetic 0-22 12  Longest Digit Span Backward 0, 2-8 3  Symbol Search 0-60 16  Longest Digit Span Sequence 0, 2-9 6  Visual Puzzles 0-26 9  Longest Letter-Number  Seq. 0, 2-8   Information 0-26 11      Coding 0-135 40      Letter-Number Seq. 0-30       Figure Weights 0-27       Comprehension 0-36       Cancellation 0-72       Picture Completion 0-24 11          WMS-IV Wechsler Memory Scale-Fourth Edition Score Report   Examinee Name Bianca Haley  Date of Report 2019/01/16  Kingsley Spittle ID [id number]  Years of Education Not Specified  Date of Birth July 14, 1950  Home Language   Gender Female  Handedness Not Specified  Race/Ethnicity   Examiner Name Ilean Skill  Date of Testing 2019/01/06  Age at Testing 79 years 4 months  Retest? No   Comments: Index Score Summary  Index Sum of Scaled Scores Index Score Percentile Rank 95% Confidence Interval Qualitative Descriptor  Auditory Memory (AMI) 50 115 84 108-120 High Average  Visual Memory (VMI) 21 102 55 97-107 Average  Immediate Memory (IMI) 37 115 84 108-120 High Average  Delayed Memory (DMI) 34 108 70 100-115 Average    Index Score Profile  The vertical bars represent the standard error of measurement (SEM).   Primary Subtest Scaled Score Summary  Subtest Domain Raw Score Scaled Score Percentile Rank  Logical Memory I AM 40 14 91  Logical Memory II AM 23 12 75  Verbal Paired Associates I AM 29 13 84  Verbal Paired Associates II AM 7 11 63  Visual Reproduction I VM 29 10 50  Visual Reproduction II VM 20 11 63  Symbol Span VWM 18 11 63   Primary Subtest Scaled Score Profile    PROCESS SCORE CONVERSIONS  Auditory Memory Process Score Summary  Process Score Raw Score Scaled Score Percentile Rank Cumulative Percentage (Base Rate)  LM II Recognition 20 - - >75%  VPA II Recognition 29 - - >75%   Visual Memory Process Score Summary  Process Score Raw Score Scaled Score Percentile Rank Cumulative Percentage (Base Rate)  VR II Recognition 6 - - >75%    SUBTEST-LEVEL DIFFERENCES WITHIN INDEXES  Auditory Memory Index  Subtest Scaled Score AMI Mean Score Difference from  Mean Critical Value Base Rate  Logical Memory I 14 12.50 1.50 2.45 >25%  Logical Memory II 12 12.50 -0.50 2.38 >25%  Verbal Paired Associates I 13 12.50 0.50 2.04 >25%  Verbal Paired Associates II 11 12.50 -1.50 3.06 >25%  Statistical significance (critical value) at the .05 level.   Immediate Memory Index  Subtest Scaled Score IMI Mean Score Difference from Mean Critical Value Base Rate  Logical Memory I 14 12.33 1.67 2.01 >25%  Verbal Paired Associates I 13 12.33 0.67 1.71 >25%  Visual Reproduction I 10 12.33 -2.33 1.71 25%  Statistical significance (critical value) at the .05 level.   Delayed Memory Index  Subtest Scaled Score DMI Mean Score Difference from Mean Critical Value Base Rate  Logical Memory II 12 11.33 0.67 2.15 >25%  Verbal Paired Associates II 11 11.33 -0.33 2.63 >25%  Visual Reproduction II 11 11.33 -0.33 1.77 >25%  Statistical significance (critical value) at the .05 level.    SUBTEST DISCREPANCY COMPARISON  Comparison Score 1 Score 2 Difference Critical Value Base Rate  Visual Reproduction I - Visual Reproduction II 10 11 -1 1.99 81.0  Statistical significance (critical value) at the .05 level.    SUBTEST-LEVEL CONTRAST SCALED SCORES  Logical Memory  Score Score 1 Score 2 Contrast Scaled Score  LM II Recognition vs. Delayed Recall >75% 12 10  LM Immediate Recall vs. Delayed Recall 14 12 7    Verbal Paired Associates  Score Score 1 Score 2 Contrast Scaled Score  VPA II Recognition vs. Delayed Recall >75% 11 9  VPA Immediate Recall vs. Delayed Recall 13 11 6    Visual Reproduction  Score Score 1 Score 2 Contrast Scaled Score  VR II Recognition vs. Delayed Recall >75% 11 10  VR Immediate Recall vs. Delayed Recall 10 11 11     INDEX-LEVEL CONTRAST SCALED SCORES  WMS-IV Indexes  Score Score 1 Score 2 Contrast Scaled Score  Auditory Memory Index vs. Visual Memory Index 115 102 10  Immediate Memory Index vs. Delayed Memory Index 115 108 7    RAW  SCORES  Subtest Score Range Adult Score Range Older Adult Raw Score  Brief Cognitive Status Exam 0-58 0-58   Logical Memory I 0-50 0-53 40  Logical Memory II 0-50 0-39 23  Verbal Paired Associates I 0-56 0-40 29  Verbal Paired Associates II 0-14 0-10 7  CVLT-II Trials 1-5 5-95 5-95   CVLT-II Long-Delay -5-5 -5-5   Designs I 0-120    Designs II 0-120    Visual Reproduction I 0-43 0-43 29  Visual Reproduction II 0-43 0-43 20  Spatial Addition 0-24    Symbol Span 0-50 0-50 18  Process Score Range Adult Score Range Older Adult Raw Score  LM II Recognition 0-30  0-23 20  VPA II Recognition 0-40 0-30 29  VPA II Word Recall 0-28 0-20   DE I Content 0-48    DE I Spatial 0-24    DE II Content 0-48    DE II Spatial 0-24    DE II Recognition 0-24    VR II Recognition 0-7 0-7 6  VR II Copy 0-43 0-43     ABILITY-MEMORY ANALYSIS  Ability Score:  GAI: 96 Date of Testing:  WAIS-IV; WMS-IV 2019/01/06  Predicted Difference Method   Index Predicted WMS-IV Index Score Actual WMS-IV Index Score Difference Critical Value  Significant Difference Y/N Base Rate  Auditory Memory 98 115 -17 10.41 Y 10%  Visual Memory 98 102 -4 7.89 N   Immediate Memory 97 115 -18 9.97 Y 5-10%  Delayed Memory 98 108 -10 12.33 N   Statistical significance (critical value) at the .01 level.   Contrast Scaled Scores  Score Score 1 Score 2 Contrast Scaled Score  General Ability Index vs. Auditory Memory Index 96 115 14  General Ability Index vs. Visual Memory Index 96 102 11  General Ability Index vs. Immediate Memory Index 96 115 14  General Ability Index vs. Delayed Memory Index 96 108 13  Verbal Comprehension Index vs. Auditory Memory Index 103 115 14  Perceptual Reasoning Index vs. Visual Memory Index 88 102 13  Working Memory Index vs. Auditory Memory Index 95 115 14    End of Report

## 2019-01-19 ENCOUNTER — Telehealth: Payer: Self-pay

## 2019-01-19 NOTE — Progress Notes (Signed)
Patient last seen for memory complaints on 12/27/18, PSG on 01/15/19.   Please call and notify the patient that the recent sleep study did not demonstrate any significant obstructive or central sleep disordered breathing and only soft, intermittent snoring. The absence of REM sleep may have underestimated her sleep disordered breathing. The patient had lower oxygen saturations at night, even during wakefulness and eventually was given O2 1 lpm. I would like for her to discuss with PCP potential causes of her low O2 sats, averaged around [id number] prior to O2 application. She slept with HOB at 45 degrees elevation. She may benefit from seeking consultation with a pulmonologist to look for an underlying pulmonary cause. Narcotic pain medication use and the use of sedating medication in general should be minimized and opioid mediation should be avoided at bedtime.  She has an inhaler on her med list, but I don't believe, she has a formal Dx of asthma or COPD.  She can FU with me as scheduled.   Star Age, MD, PhD [facility])

## 2019-01-19 NOTE — Telephone Encounter (Signed)
Pt returned call. Please call back when available. 

## 2019-01-19 NOTE — Telephone Encounter (Signed)
-----   Message from Star Age, MD sent at 01/19/2019  8:35 AM EDT ----- Patient last seen for memory complaints on 12/27/18, PSG on 01/15/19.   Please call and notify the patient that the recent sleep study did not demonstrate any significant obstructive or central sleep disordered breathing and only soft, intermittent snoring. The absence of REM sleep may have underestimated her sleep disordered breathing. The patient had lower oxygen saturations at night, even during wakefulness and eventually was given O2 1 lpm. I would like for her to discuss with PCP potential causes of her low O2 sats, averaged around [id number] prior to O2 application. She slept with HOB at 45 degrees elevation. She may benefit from seeking consultation with a pulmonologist to look for an underlying pulmonary cause. Narcotic pain medication use and the use of sedating medication in general should be minimized and opioid mediation should be avoided at bedtime.  She has an inhaler on her med list, but I don't believe, she has a formal Dx of asthma or COPD.  She can FU with me as scheduled.   Star Age, MD, PhD [facility])

## 2019-01-19 NOTE — Telephone Encounter (Signed)
I called pt to discuss her sleep study results. No answer, left a message asking her to call me back. 

## 2019-01-19 NOTE — Procedures (Signed)
PATIENT'S NAME:  Bianca Haley, Bianca Haley DOB:      [birth year]      MR#:    [id number]     DATE OF RECORDING: 01/15/2019 REFERRING M.D.:  Lew Dawes, MD Study Performed:   Baseline Polysomnogram HISTORY: 68 year old woman with a history of hypertension, history of migraines, reflux disease, low back pain, s/p surgery, on chronic narcotic pain medications, arthritis, s/p multiple LLE orthopedic surgeries, anxiety, depression and vitamin D deficiency, who presents for evaluation of her sleep disturbance. She has had cognitive complaints. The patient endorsed the Epworth Sleepiness Scale at 11 points. The patient's weight 157 pounds with a height of 65 (inches), resulting in a BMI of 26.1 kg/m2. The patient's neck circumference measured 14.5 inches.  CURRENT MEDICATIONS: Tylenol, Ventolin, Tenormin, Valium, Cardizem, Flonase, Opana, Protonix, Paxil, Crestor, Imitrex, Desyrel, Neurontin.   PROCEDURE:  This is a multichannel digital polysomnogram utilizing the Somnostar 11.2 system.  Electrodes and sensors were applied and monitored per AASM Specifications.   EEG, EOG, Chin and Limb EMG, were sampled at 200 Hz.  ECG, Snore and Nasal Pressure, Thermal Airflow, Respiratory Effort, CPAP Flow and Pressure, Oximetry was sampled at 50 Hz. Digital video and audio were recorded.      BASELINE STUDY  Lights Out was at 21:40 and Lights On at 05:00.  Total recording time (TRT) was 441 minutes, with a total sleep time (TST) of 328 minutes.   The patient's sleep latency was 64 minutes, which is delayed. REM sleep was absent. The sleep efficiency was 74.4%, which is reduced.     SLEEP ARCHITECTURE: WASO (Wake after sleep onset) was 53 minutes with mild to moderate sleep fragmentation noted. There were 24.5 minutes in Stage N1, 121.5 minutes Stage N2, 182 minutes Stage N3 and 0 minutes in Stage REM.  The percentage of Stage N1 was 7.5%, Stage N2 was 37.%, which is reduced, Stage N3 was 55.5%, which is markedly increased, and  Stage R (REM sleep) was absent. The arousals were noted as: 39 were spontaneous, 0 were associated with PLMs, 3 were associated with respiratory events.  RESPIRATORY ANALYSIS:  There were a total of 8 respiratory events:  0 obstructive apneas, 0 central apneas and 0 mixed apneas with a total of 0 apneas and an apnea index (AI) of 0 /hour. There were 8 hypopneas with a hypopnea index of 1.5 /hour. The patient also had 0 respiratory event related arousals (RERAs).      The total APNEA/HYPOPNEA INDEX (AHI) was 1.5 /hour and the total RESPIRATORY DISTURBANCE INDEX was 1.5 /hour.  0 events occurred in REM sleep and 16 events in NREM. The REM AHI was 0 /hour, versus a non-REM AHI of 1.5. The patient spent 328 minutes of total sleep time in the supine position. Of note, she slept elevated with the head of bed around 45 degrees. The supine AHI was 1.5 versus a non-supine AHI of 0.0.  OXYGEN SATURATION & C02:  The Wake baseline 02 saturation was 87% during wakefulness and sleep. Supplemental O2 was added at 1 lpm via [place] on 03:35, after which her average O2 saturations were 91%. Lowest O2 was 79%. Time spent below 89% saturation equaled 276 minutes.  PERIODIC LIMB MOVEMENTS: The patient had a total of 0 Periodic Limb Movements.  The Periodic Limb Movement (PLM) index was 0 and the PLM Arousal index was 0/hour.  Audio and video analysis did not show any abnormal or unusual movements, behaviors, phonations or vocalizations.  The patient took no bathroom breaks.  Soft, intermittent snoring was noted. The EKG was in keeping with normal sinus rhythm (NSR).  IMPRESSION:  1. Nocturnal hypoxemia 2. Dysfunctions associated with sleep stages or arousals from sleep   RECOMMENDATIONS:  1. This study does not demonstrate any significant obstructive or central sleep disordered breathing and only soft, intermittent snoring. The absence of REM sleep may underestimate her sleep disordered breathing.  2. The patient had  lower oxygen saturations at night. She will be advised to seek consultation with a pulmonologist to look for an underlying pulmonary cause. Narcotic pain medication use and the use of sedating medication in general should be minimized and opioid mediation should be avoided at bedtime.  3. This study does not support an intrinsic sleep disorder as a cause of the patient's symptoms. Other causes, including circadian rhythm disturbances, an underlying mood disorder, medication effect and/or an underlying medical problem cannot be ruled out. 4. This study shows delay in sleep onset and mild to moderate sleep fragmentation with abnormal sleep stage percentages; these are nonspecific findings and per se do not signify an intrinsic sleep disorder or a cause for the patient's sleep-related symptoms. Causes include (but are not limited to) the first night effect of the sleep study, circadian rhythm disturbances, medication effect or an underlying mood disorder or medical problem.  5. The patient should be cautioned not to drive, work at heights, or operate dangerous or heavy equipment when tired or sleepy. Review and reiteration of good sleep hygiene measures should be pursued with any patient. 6. The patient will be advised to follow up at [facility] as scheduled.   I certify that I have reviewed the entire raw data recording prior to the issuance of this report in accordance with the Standards of Accreditation of the American Academy of Sleep Medicine (AASM)  Star Age, MD, PhD Diplomat, American Board of Neurology and Sleep Medicine (Neurology and Sleep Medicine)

## 2019-01-19 NOTE — Telephone Encounter (Signed)
I called pt, had an extended conversation with pt regarding her sleep study results and recommendations. Pt will follow up with her PCP regarding the lower O2 and the possible need for a pulmonology referral. Pt verbalized understanding of results. Pt had no further questions at this time but was encouraged to call back if questions arise.

## 2019-01-30 ENCOUNTER — Encounter: Payer: Self-pay | Admitting: Internal Medicine

## 2019-01-30 ENCOUNTER — Ambulatory Visit (INDEPENDENT_AMBULATORY_CARE_PROVIDER_SITE_OTHER): Payer: Medicare Other | Admitting: Internal Medicine

## 2019-01-30 ENCOUNTER — Other Ambulatory Visit (INDEPENDENT_AMBULATORY_CARE_PROVIDER_SITE_OTHER): Payer: Medicare Other

## 2019-01-30 ENCOUNTER — Other Ambulatory Visit: Payer: Self-pay

## 2019-01-30 VITALS — BP 146/80 | HR 56 | Temp 98.2°F | Ht 65.5 in | Wt 156.0 lb

## 2019-01-30 DIAGNOSIS — R0609 Other forms of dyspnea: Secondary | ICD-10-CM | POA: Diagnosis not present

## 2019-01-30 DIAGNOSIS — H6123 Impacted cerumen, bilateral: Secondary | ICD-10-CM

## 2019-01-30 DIAGNOSIS — Z23 Encounter for immunization: Secondary | ICD-10-CM

## 2019-01-30 DIAGNOSIS — I10 Essential (primary) hypertension: Secondary | ICD-10-CM

## 2019-01-30 DIAGNOSIS — G479 Sleep disorder, unspecified: Secondary | ICD-10-CM | POA: Diagnosis not present

## 2019-01-30 LAB — BASIC METABOLIC PANEL
BUN: 16 mg/dL (ref 6–23)
CO2: 32 mEq/L (ref 19–32)
Calcium: 9.6 mg/dL (ref 8.4–10.5)
Chloride: 103 mEq/L (ref 96–112)
Creatinine, Ser: 0.99 mg/dL (ref 0.40–1.20)
GFR: 55.38 mL/min — ABNORMAL LOW (ref >60.00)
Glucose, Bld: 79 mg/dL (ref 70–99)
Potassium: 4.5 mEq/L (ref 3.5–5.1)
Sodium: 144 mEq/L (ref 135–145)

## 2019-01-30 LAB — CBC WITH DIFFERENTIAL/PLATELET
Basophils Absolute: 0 10*3/uL (ref 0.0–0.1)
Basophils Relative: 0.6 % (ref 0.0–3.0)
Eosinophils Absolute: 0.2 10*3/uL (ref 0.0–0.7)
Eosinophils Relative: 3.5 % (ref 0.0–5.0)
HCT: 40.1 % (ref 36.0–46.0)
Hemoglobin: 13.3 g/dL (ref 12.0–15.0)
Lymphocytes Relative: 29.8 % (ref 12.0–46.0)
Lymphs Abs: 1.5 10*3/uL (ref 0.7–4.0)
MCHC: 33.2 g/dL (ref 30.0–36.0)
MCV: 85.6 fl (ref 78.0–100.0)
Monocytes Absolute: 0.4 10*3/uL (ref 0.1–1.0)
Monocytes Relative: 7 % (ref 3.0–12.0)
Neutro Abs: 3 10*3/uL (ref 1.4–7.7)
Neutrophils Relative %: 59.1 % (ref 43.0–77.0)
Platelets: 218 10*3/uL (ref 150.0–400.0)
RBC: 4.68 Mil/uL (ref 3.87–5.11)
RDW: 14.6 % (ref 11.5–15.5)
WBC: 5.1 10*3/uL (ref 4.0–10.5)

## 2019-01-30 LAB — URINALYSIS
Bilirubin Urine: NEGATIVE
Hgb urine dipstick: NEGATIVE
Ketones, ur: NEGATIVE
Leukocytes,Ua: NEGATIVE
Nitrite: NEGATIVE
Specific Gravity, Urine: 1.025 (ref 1.000–1.030)
Total Protein, Urine: NEGATIVE
Urine Glucose: NEGATIVE
Urobilinogen, UA: 0.2 (ref 0.0–1.0)
pH: 5.5 (ref 5.0–8.0)

## 2019-01-30 LAB — HEPATIC FUNCTION PANEL
ALT: 11 U/L (ref 0–35)
AST: 20 U/L (ref 0–37)
Albumin: 4.4 g/dL (ref 3.5–5.2)
Alkaline Phosphatase: 94 U/L (ref 39–117)
Bilirubin, Direct: 0.1 mg/dL (ref 0.0–0.3)
Total Bilirubin: 0.5 mg/dL (ref 0.2–1.2)
Total Protein: 7.5 g/dL (ref 6.0–8.3)

## 2019-01-30 LAB — TSH: TSH: 0.87 u[IU]/mL (ref 0.35–4.50)

## 2019-01-30 MED ORDER — DIAZEPAM 5 MG PO TABS: ORAL_TABLET | ORAL | 3 refills | Status: DC

## 2019-01-30 NOTE — Assessment & Plan Note (Signed)
Atenolol, Diltiazem Added Maxzide

## 2019-01-30 NOTE — Addendum Note (Signed)
Addended by: Karren Cobble on: 01/30/2019 04:34 PM   Modules accepted: Orders

## 2019-01-30 NOTE — Progress Notes (Addendum)
Subjective:  Patient ID: Bianca Haley, female    DOB: 04/10/1951  Age: 68 y.o. MRN: [id number]  CC: No chief complaint on file.   HPI MAEBRY MITNICK presents for a hearing loss F/u HTN, depression C/o SOB - DOE; wt gain. No CP C/o insomnia: taking 1800 mg Gabapentin at hs and Trazodone!...  Outpatient Medications Prior to Visit  Medication Sig Dispense Refill  . acetaminophen (TYLENOL) 650 MG CR tablet Take 1,300 mg by mouth every 8 (eight) hours as needed for pain.    [name] albuterol (VENTOLIN HFA) 108 (90 Base) MCG/ACT inhaler INHALE 2 PUFFS INTO THE LUNGS EVERY 4 HOURS AS NEEDED FOR WHEEZING OR SHORTNESS OF BREATH (Patient taking differently: Inhale 2 puffs into the lungs every 4 (four) hours as needed for wheezing or shortness of breath. ) 18 g 2  . atenolol (TENORMIN) 100 MG tablet TAKE 1 TABLET(100 MG) BY MOUTH EVERY DAY (Patient taking differently: Take 100 mg by mouth daily. ) 90 tablet 3  . Carboxymethylcellulose Sodium (RETAINE CMC OP) Place 1 application into both eyes at bedtime.    . Cholecalciferol (VITAMIN D) 2000 units tablet Take 2,000 Units by mouth at bedtime.     . diazepam (VALIUM) 5 MG tablet TAKE 1 TABLET BY MOUTH AS NEEDED FOR ANXIETY OR MUSCLE SPASMS 30 tablet 3  . diltiazem (CARDIZEM CD) 180 MG 24 hr capsule TAKE 1 CAPSULE(180 MG) BY MOUTH DAILY 90 capsule 1  . fluticasone (FLONASE) 50 MCG/ACT nasal spray Place 2 sprays into both nostrils daily as needed for allergies or rhinitis.    . Multiple Vitamin (MULTIVITAMIN WITH MINERALS) TABS tablet Take 2 tablets by mouth daily.     [name] oxymorphone (OPANA) 10 MG tablet Take 10 mg by mouth 2 (two) times daily as needed for pain.     . pantoprazole (PROTONIX) 40 MG tablet TAKE 1 TABLET(40 MG) BY MOUTH TWICE DAILY BEFORE A MEAL 180 tablet 3  . PARoxetine (PAXIL) 40 MG tablet TAKE 1 TABLET(40 MG) BY MOUTH TWICE DAILY 180 tablet 3  . Polyethyl Glycol-Propyl Glycol (SYSTANE OP) Place 1 drop into both eyes daily as needed (dry  eyes).    . polyethylene glycol (MIRALAX / GLYCOLAX) packet Take 17 g by mouth 2 (two) times daily. (Patient taking differently: Take 17 g by mouth daily as needed (constipation). Mix in 8 oz liquid and drink) 14 each 0  . potassium chloride (K-DUR) 10 MEQ tablet TAKE 1 TABLET(10 MEQ) BY MOUTH DAILY 30 tablet 3  . Probiotic CAPS Take 1 capsule by mouth daily.    . rosuvastatin (CRESTOR) 5 MG tablet TAKE 1 TABLET(5 MG) BY MOUTH DAILY (Patient taking differently: Take 5 mg by mouth daily. ) 90 tablet 3  . SUMAtriptan (IMITREX) 100 MG tablet TAKE 1 TABLET BY MOUTH DAILY AS NEEDED FOR MIGRAINE. MAY REPEAT IN 2 HOURS IF HEADACHE PERSISTS OR RECURS (Patient taking differently: Take 100 mg by mouth every 2 (two) hours as needed for migraine. ) 12 tablet 3  . traZODone (DESYREL) 150 MG tablet Take 1 tablet (150 mg total) by mouth at bedtime. 90 tablet 3  . vitamin B-12 (CYANOCOBALAMIN) 500 MCG tablet Take 500 mcg by mouth daily.    [name] gabapentin (NEURONTIN) 300 MG capsule Take 1 capsule (300 mg total) by mouth 3 (three) times daily for 30 days. (Patient taking differently: Take 600-900 mg by mouth at bedtime. ) 90 capsule 0   No facility-administered medications prior to visit.  ROS: Review of Systems  Constitutional: Positive for unexpected weight change. Negative for activity change, appetite change, chills and fatigue.  HENT: Positive for hearing loss. Negative for congestion, mouth sores and sinus pressure.   Eyes: Negative for visual disturbance.  Respiratory: Positive for shortness of breath. Negative for cough and chest tightness.   Gastrointestinal: Negative for abdominal pain and nausea.  Genitourinary: Negative for difficulty urinating, frequency and vaginal pain.  Musculoskeletal: Negative for back pain and gait problem.  Skin: Negative for pallor and rash.  Neurological: Negative for dizziness, tremors, weakness, numbness and headaches.  Psychiatric/Behavioral: Negative for confusion,  sleep disturbance and suicidal ideas.    Objective:  BP (!) 146/80 (BP Location: Left Arm, Patient Position: Sitting, Cuff Size: Normal)   Pulse (!) 56   Temp 98.2 F (36.8 C) (Oral)   Ht 5' 5.5" (1.664 m)   Wt 156 lb (70.8 kg)   SpO2 93%   BMI 25.56 kg/m   BP Readings from Last 3 Encounters:  01/30/19 (!) 146/80  12/27/18 (!) 152/70  10/20/18 (!) 161/77    Wt Readings from Last 3 Encounters:  01/30/19 156 lb (70.8 kg)  12/27/18 156 lb (70.8 kg)  10/20/18 147 lb 14.9 oz (67.1 kg)    Physical Exam Constitutional:      General: She is not in acute distress.    Appearance: She is well-developed. She is obese.  HENT:     Head: Normocephalic.     Right Ear: External ear normal. There is impacted cerumen.     Left Ear: External ear normal. There is impacted cerumen.     Nose: Nose normal.  Eyes:     General:        Right eye: No discharge.        Left eye: No discharge.     Conjunctiva/sclera: Conjunctivae normal.     Pupils: Pupils are equal, round, and reactive to light.  Neck:     Musculoskeletal: Normal range of motion and neck supple.     Thyroid: No thyromegaly.     Vascular: No JVD.     Trachea: No tracheal deviation.  Cardiovascular:     Rate and Rhythm: Normal rate and regular rhythm.     Heart sounds: Normal heart sounds.  Pulmonary:     Effort: No respiratory distress.     Breath sounds: No stridor. No wheezing.  Abdominal:     General: Bowel sounds are normal. There is no distension.     Palpations: Abdomen is soft. There is no mass.     Tenderness: There is no abdominal tenderness. There is no guarding or rebound.  Musculoskeletal:        General: No tenderness.  Lymphadenopathy:     Cervical: No cervical adenopathy.  Skin:    Findings: No erythema or rash.  Neurological:     Cranial Nerves: No cranial nerve deficit.     Motor: No abnormal muscle tone.     Coordination: Coordination normal.     Deep Tendon Reflexes: Reflexes normal.   Psychiatric:        Behavior: Behavior normal.        Thought Content: Thought content normal.        Judgment: Judgment normal.     Procedure Note :     Procedure :  Ear irrigation right and left ears   Indication:  Cerumen impaction right and left ears   Risks, including pain, dizziness, eardrum perforation, bleeding, infection and others as  well as benefits were explained to the patient in detail. Verbal consent was obtained and the patient agreed to proceed.    We used "The Elephant Ear Irrigation Device" filled with lukewarm water for irrigation. A large amount wax was recovered from both ears. Procedure has also required manual wax removal/instrumentation with an ear wax curette and ear forceps on the right and left ears.   Tolerated well. Complications: None.   Postprocedure instructions :  Call if problems.   Lab Results  Component Value Date   WBC 4.9 09/21/2018   HGB 12.5 09/21/2018   HCT 37.1 09/21/2018   PLT 204.0 09/21/2018   GLUCOSE 117 (H) 10/20/2018   CHOL 262 (H) 09/26/2015   TRIG 71.0 09/26/2015   HDL 61.60 09/26/2015   LDLCALC 186 (H) 09/26/2015   ALT 15 10/20/2018   AST 26 10/20/2018   NA 145 10/20/2018   K 5.3 (H) 10/20/2018   CL 104 10/20/2018   CREATININE 0.86 10/20/2018   BUN 20 10/20/2018   CO2 31 10/20/2018   TSH 3.570 11/23/2017   INR 1.0 09/21/2018   HGBA1C 6.1 06/23/2018    No results found.  Assessment & Plan:   There are no diagnoses linked to this encounter.   No orders of the defined types were placed in this encounter.    Follow-up: No follow-ups on file.  [name], MD

## 2019-01-30 NOTE — Assessment & Plan Note (Signed)
?  deconditioning and wt gain Labs Re-condition

## 2019-01-30 NOTE — Progress Notes (Signed)
Patient consent obtained. Irrigation with water and peroxide performed. Full view of tympanic membranes after procedure.  Patient tolerated procedure well.   

## 2019-01-30 NOTE — Assessment & Plan Note (Signed)
See Dr Amada Kingfisher blanket

## 2019-01-30 NOTE — Assessment & Plan Note (Signed)
See procedure 

## 2019-01-30 NOTE — Patient Instructions (Signed)
Weighted blanket    These suggestions will probably help you to improve your metabolism if you are not overweight and to lose weight if you are overweight: 1.  Reduce your consumption of sugars and starches.  Eliminate high fructose corn syrup from your diet.  Reduce your consumption of processed foods.  For desserts try to have seasonal fruits, berries, nuts, cheeses or dark chocolate with more than 70% cacao. 2.  Do not snack 3.  You do not have to eat breakfast.  If you choose to have breakfast-eat plain greek yogurt, eggs, oatmeal (without sugar) 4.  Drink water, freshly brewed unsweetened tea (green, black or herbal) or coffee.  Do not drink sodas including diet sodas , juices, beverages sweetened with artificial sweeteners. 5.  Reduce your consumption of refined grains. 6.  Avoid protein drinks such as Optifast, Slim fast etc. Eat chicken, fish, meat, dairy and beans for your sources of protein 7.  Natural unprocessed fats like cold pressed virgin olive oil, butter, coconut oil are good for you.  Eat avocados 8.  Increase your consumption of fiber.  Fruits, berries, vegetables, whole grains, flaxseeds, Chia seeds, beans, popcorn, nuts, oatmeal are good sources of fiber 9.  Use vinegar in your diet, i.e. apple cider vinegar, red wine or balsamic vinegar 10.  You can try fasting.  For example you can skip breakfast and lunch every other day (24-hour fast) 11.  Stress reduction, good night sleep, relaxation, meditation, yoga and other physical activity is likely to help you to maintain low weight too. 12.  If you drink alcohol, limit your alcohol intake to no more than 2 drinks a day.

## 2019-01-31 ENCOUNTER — Telehealth: Payer: Self-pay | Admitting: Internal Medicine

## 2019-01-31 DIAGNOSIS — G4734 Idiopathic sleep related nonobstructive alveolar hypoventilation: Secondary | ICD-10-CM

## 2019-01-31 NOTE — Telephone Encounter (Signed)
Referral was placed for the [facility].  Sheena at the sleep center states pt had just recently had a sleep study done and pt was advised:   "I would like for her to discuss with PCP potential causes of her low O2 sats, averaged around [id number] prior to O2 application. She slept with HOB at 45 degrees elevation. She may benefit from seeking consultation with a pulmonologist to look for an underlying pulmonary cause."  Referral to sleep center has been closed.

## 2019-02-01 NOTE — Telephone Encounter (Signed)
Bianca Haley saw Dr. Elsworth Soho in the past.  Does she need a referral to see him again? Thanks

## 2019-02-01 NOTE — Telephone Encounter (Signed)
Probably so, if you would like her to be seen by Dr. Elsworth Soho. That way they will contact her to schedule.

## 2019-02-03 ENCOUNTER — Encounter: Payer: Medicare Other | Attending: Psychology | Admitting: Psychology

## 2019-02-03 ENCOUNTER — Other Ambulatory Visit: Payer: Self-pay

## 2019-02-03 ENCOUNTER — Other Ambulatory Visit: Payer: Self-pay | Admitting: Internal Medicine

## 2019-02-03 DIAGNOSIS — R413 Other amnesia: Secondary | ICD-10-CM | POA: Insufficient documentation

## 2019-02-03 DIAGNOSIS — R351 Nocturia: Secondary | ICD-10-CM | POA: Insufficient documentation

## 2019-02-03 DIAGNOSIS — R519 Headache, unspecified: Secondary | ICD-10-CM | POA: Insufficient documentation

## 2019-02-03 DIAGNOSIS — G479 Sleep disorder, unspecified: Secondary | ICD-10-CM | POA: Insufficient documentation

## 2019-02-03 DIAGNOSIS — R419 Unspecified symptoms and signs involving cognitive functions and awareness: Secondary | ICD-10-CM | POA: Insufficient documentation

## 2019-02-03 NOTE — Telephone Encounter (Signed)
Ok Thx 

## 2019-03-03 ENCOUNTER — Other Ambulatory Visit: Payer: Self-pay | Admitting: Internal Medicine

## 2019-03-07 NOTE — Progress Notes (Signed)
Today I provided feedback regarding the results of the recent neuropsychological evaluation.  The full report can be found in the patient's EMR dated 01/16/2019.  This was a 1 hour in person visit that consisted of myself and the patient in my outpatient clinic office.  Below you will find the summary and diagnosis/results of the current neuropsychological evaluation.    Summary of Results:                                    Overall, there were no significant findings of neuropsychological or cognitive deficits although the patient did perform from a global standpoint below predicted levels.  This would indicate some mild weaknesses that were primarily identified for issues related to information processing speed and mild weaknesses with regard to visual-spatial and visual analysis/visual reasoning abilities.  There were also some mild issues with verbal fluency but no indications of other significant expressive language deficits and the patient did quite well on targeted naming and word finding.  The patient did fine on measures of verbal comprehension measures.  The patient also did quite well on measures of auditory learning and memory.  The patient showed normal auditory encoding abilities and high average functions with regard to auditory learning and memory both immediate and delayed.  The patient did not do quite as well on visual memory task but they were still performed in the average range with no indication of significant deficits with regard to visual memory and learning.  Impression/Diagnosis:                                 Overall, the results of the current neuropsychological evaluation are not indicative of any significant cognitive deficits or significant deterioration in overall neuropsychological status.  The patient showed some mild weaknesses relative to premorbid estimations for measures of visual analysis and visual reasoning and some mild weaknesses with regard to overall information  processing speed.  However, she did quite well on measures of auditory learning and memory, targeted naming abilities and visual constructional abilities.  This pattern is not consistent with those typically seen with degenerative dementias but the overall slowing of information processing time and potentially the subjective reports by her daughter regarding difficulty with free recall of information could be explained by the mild chronic small vessel disease and mild cortical atrophy.  While it is possible that we are very early in the assessment process for a particular degenerative dementia beyond those due to mild small vessel disease and aging it would be important for repeat testing in approximately 9 months to be able to make more sensitive direct comparisons to the patient's own performance.  Again, however, there are no clear indications of any significant cognitive deficits or memory deficits beyond mild difficulties in information processing speed and visual reasoning/visual analysis functions.  I will provide feedback regarding the current results to the patient.  While the appointment for this feedback is not until November we will try to move this appointment up sooner.  I will also make sure that this evaluation is available for the patient and her my chart.  Diagnosis:                    Axis I: Age-related memory disorder   Ilean Skill, Psy.D. Neuropsychologist

## 2019-03-09 ENCOUNTER — Ambulatory Visit: Payer: Medicare Other | Admitting: Psychology

## 2019-03-21 DIAGNOSIS — M25561 Pain in right knee: Secondary | ICD-10-CM | POA: Diagnosis not present

## 2019-03-21 DIAGNOSIS — Z6825 Body mass index (BMI) 25.0-25.9, adult: Secondary | ICD-10-CM | POA: Diagnosis not present

## 2019-03-21 DIAGNOSIS — M5441 Lumbago with sciatica, right side: Secondary | ICD-10-CM | POA: Diagnosis not present

## 2019-03-21 DIAGNOSIS — I10 Essential (primary) hypertension: Secondary | ICD-10-CM | POA: Diagnosis not present

## 2019-03-21 DIAGNOSIS — M542 Cervicalgia: Secondary | ICD-10-CM | POA: Diagnosis not present

## 2019-03-23 ENCOUNTER — Ambulatory Visit: Payer: Medicare Other | Admitting: Psychology

## 2019-03-24 DIAGNOSIS — Z471 Aftercare following joint replacement surgery: Secondary | ICD-10-CM | POA: Diagnosis not present

## 2019-03-24 DIAGNOSIS — Z96652 Presence of left artificial knee joint: Secondary | ICD-10-CM | POA: Diagnosis not present

## 2019-04-03 ENCOUNTER — Encounter: Payer: Self-pay | Admitting: Gastroenterology

## 2019-04-04 ENCOUNTER — Ambulatory Visit: Payer: Medicare Other | Admitting: Psychology

## 2019-04-25 ENCOUNTER — Ambulatory Visit: Payer: Medicare Other | Admitting: Psychology

## 2019-04-29 ENCOUNTER — Other Ambulatory Visit: Payer: Self-pay | Admitting: Internal Medicine

## 2019-05-12 ENCOUNTER — Ambulatory Visit: Payer: Medicare Other | Admitting: Psychology

## 2019-05-25 DIAGNOSIS — Z1231 Encounter for screening mammogram for malignant neoplasm of breast: Secondary | ICD-10-CM | POA: Diagnosis not present

## 2019-05-25 DIAGNOSIS — Z23 Encounter for immunization: Secondary | ICD-10-CM | POA: Diagnosis not present

## 2019-05-25 DIAGNOSIS — Z01419 Encounter for gynecological examination (general) (routine) without abnormal findings: Secondary | ICD-10-CM | POA: Diagnosis not present

## 2019-05-25 DIAGNOSIS — Z780 Asymptomatic menopausal state: Secondary | ICD-10-CM | POA: Diagnosis not present

## 2019-06-08 DIAGNOSIS — B372 Candidiasis of skin and nail: Secondary | ICD-10-CM | POA: Diagnosis not present

## 2019-06-08 DIAGNOSIS — D485 Neoplasm of uncertain behavior of skin: Secondary | ICD-10-CM | POA: Diagnosis not present

## 2019-06-08 DIAGNOSIS — Z85828 Personal history of other malignant neoplasm of skin: Secondary | ICD-10-CM | POA: Diagnosis not present

## 2019-06-08 DIAGNOSIS — D235 Other benign neoplasm of skin of trunk: Secondary | ICD-10-CM | POA: Diagnosis not present

## 2019-06-15 DIAGNOSIS — M5441 Lumbago with sciatica, right side: Secondary | ICD-10-CM | POA: Diagnosis not present

## 2019-06-15 DIAGNOSIS — M5136 Other intervertebral disc degeneration, lumbar region: Secondary | ICD-10-CM | POA: Diagnosis not present

## 2019-06-15 DIAGNOSIS — Z981 Arthrodesis status: Secondary | ICD-10-CM | POA: Diagnosis not present

## 2019-06-15 DIAGNOSIS — I10 Essential (primary) hypertension: Secondary | ICD-10-CM | POA: Diagnosis not present

## 2019-06-15 DIAGNOSIS — Z6825 Body mass index (BMI) 25.0-25.9, adult: Secondary | ICD-10-CM | POA: Diagnosis not present

## 2019-06-22 DIAGNOSIS — Z23 Encounter for immunization: Secondary | ICD-10-CM | POA: Diagnosis not present

## 2019-06-29 ENCOUNTER — Ambulatory Visit (INDEPENDENT_AMBULATORY_CARE_PROVIDER_SITE_OTHER): Payer: Medicare Other | Admitting: Neurology

## 2019-06-29 ENCOUNTER — Encounter: Payer: Self-pay | Admitting: Neurology

## 2019-06-29 ENCOUNTER — Other Ambulatory Visit: Payer: Self-pay

## 2019-06-29 VITALS — BP 134/86 | HR 60 | Temp 97.6°F | Ht 65.5 in | Wt 155.0 lb

## 2019-06-29 DIAGNOSIS — R4181 Age-related cognitive decline: Secondary | ICD-10-CM

## 2019-06-29 DIAGNOSIS — R419 Unspecified symptoms and signs involving cognitive functions and awareness: Secondary | ICD-10-CM

## 2019-06-29 NOTE — Patient Instructions (Signed)
I do believe we Can continue to monitor your memory.  Please follow-up routinely in 1 year.  You had a reasonably benign result with formal neuropsychological evaluation with Dr. Jefm Miles.  We can revisit repeat testing next year. I still recommend that you consult with pulmonology.  Your sleep study did show lower Oxygen saturations.  You do not have sleep apnea and do not need to be on a CPAP machine.

## 2019-06-29 NOTE — Progress Notes (Signed)
Subjective:    Patient ID: Bianca Haley is a 69 y.o. female.  HPI     Interim history:   Bianca Haley is a 68 year old right-handed woman with an underlying medical history of hypertension, history of migraines, reflux disease, low back pain, s/p surgery, on chronic narcotic pain medications, arthritis, s/p multiple LLE orthopedic surgeries, anxiety, depression and vitamin D deficiency, who presents for follow-up consultation of her cognitive complaints.  The patient is unaccompanied today.  I last saw her on 12/27/2018, at which time she reported feeling fairly stable, she had settled into independent living.  She was not sleeping very well.  She was taking trazodone each night.  She was also taking gabapentin.  She was advised to proceed with a sleep study.  She was also advised to proceed with neuropsychological testing to complete her evaluation with Dr. Jefm Miles.  She had neuropsychological testing on 01/16/2019 and a feedback appointment afterwards. I reviewed the test results and recommendations: << Impression/Diagnosis:                                 Overall, the results of the current neuropsychological evaluation are not indicative of any significant cognitive deficits or significant deterioration in overall neuropsychological status.  The patient showed some mild weaknesses relative to premorbid estimations for measures of visual analysis and visual reasoning and some mild weaknesses with regard to overall information processing speed.  However, she did quite well on measures of auditory learning and memory, targeted naming abilities and visual constructional abilities.  This pattern is not consistent with those typically seen with degenerative dementias but the overall slowing of information processing time and potentially the subjective reports by her daughter regarding difficulty with free recall of information could be explained by the mild chronic small vessel disease and mild cortical  atrophy.   While it is possible that we are very early in the assessment process for a particular degenerative dementia beyond those due to mild small vessel disease and aging it would be important for repeat testing in approximately 9 months to be able to make more sensitive direct comparisons to the patient's own performance.  Again, however, there are no clear indications of any significant cognitive deficits or memory deficits beyond mild difficulties in information processing speed and visual reasoning/visual analysis functions.   I will provide feedback regarding the current results to the patient.  While the appointment for this feedback is not until November we will try to move this appointment up sooner.  I will also make sure that this evaluation is available for the patient and her my chart.   Diagnosis:                     Axis I: Age-related memory disorder>>  She had a baseline sleep study on 01/15/2019, which did not show any significant obstructive or central sleep disordered breathing and only soft, intermittent snoring.  However, she had absence of REM sleep.  She was also noted to have lower oxygen saturations at night. She was advised to seek consultation with a pulmonologist to look for an underlying pulmonary cause.  We called her with her test results.  Today, 06/29/2019: She reports Feeling stable, she has had no new issues, sleeps with the head of bed elevated around 45 degrees at home.  She has a history of severe nocturnal reflux disease and silent aspiration she states.  She has  not seen her pulmonology recently.  She got both her Covid shot successfully.  She has ongoing issues maintaining sleep.  She has started using CBD oil at bedtime.  She tries to be in bed around 9 and tries to be asleep around 10.  Feels that her memory is stable. She has a follow-up appointment with Dr. Jefm Miles but would like to push it out if possible.  The patient's allergies, current  medications, family history, past medical history, past social history, past surgical history and problem list were reviewed and updated as appropriate.    Previously:   I saw her on 06/28/2018, at which time she reported feeling stable.  She was on pain medication, was taking gabapentin at night only, 900 mg, her mood was stable.  She had moved into [facility] independent living.  I suggested we seek the consultation with neuropsychology for memory evaluation.    She met with Dr. Sima Matas for initial consultation on 06/30/2018.  She has not had the testing yet.  Of note, she was admitted to the hospital in the interim in March with altered mental status and fever and she was treated for suspected viral pneumonia.  She was discharged with a reduced dose of gabapentin. She had a COVID-19 test negative in June.  She had a GI procedure recently.    I first met her on 11/23/2017 at the request of her primary care physician, at which time she reported a several month history or 2 year history of short-term memory issues. Her MMSE was 25 at the time. I suggested workup in the form of blood tests, neuropsychological evaluation and brain scan. Her blood test showed normal RPR, TSH, B12, folate, CMP, A1c was in the prediabetes range at 5.9. She had a brain MRI with and without contrast on 12/28/2017 and I reviewed the results: IMPRESSION:    MRI brain (with and without) demonstrating: - Mild perisylvian atrophy. - Mild chronic small vessel ischemic disease.   We called her with her test results.   11/23/2017: (She) reports short term memory loss for the past few months and maybe a couple of years. Her daughter has voiced concern. Patient reports difficulty remembering names all her life. Mom had dementia in the context of N[facility].  I reviewed your office note from 05/14/2017. She had a brain MRI with and without contrast on 09/27/2013 and I reviewed the results: IMPRESSION: 1. No focal lesion along the  planum sphenoidale. The abnormality on CT is likely partial voluming. 2. Normal MRI appearance of the brain for age. 3. Mild diffuse sinus disease as described. The left maxillary sinus is completely opacified.    She has a Hx of anxiety. She lost her first husband at age 63 from prostate cancer. She has one daughter and one son from her first husband, ages 26 and 78, daughter lives in [place] and has 2 kids, son lives in [place] state and has no children. Sister lives in [place]. She lives with her long-term boyfriend whom I have seen before as well. She is a nonsmoker and does not drink alcohol, drinks caffeine in limitation, 1-2 cups in the mornings typically. She follows with pain management through her neurosurgeon's office. She is using her pain medication cautiously and only if needed. She is retired. She has a long-standing history of anxiety, dating back to even childhood as she recalls. She does not have a history of ADD or ADHD as far she knows. She had left foot surgery in March last  year and needed subsequent surgeries due to infection and need for hardware removal and bone graft was placed eventually.   Her Past Medical History Is Significant For: Past Medical History:  Diagnosis Date  . Anemia    history of anemia  . Anxiety   . Arthritis   . Chronic low back pain   . Chronic renal failure    patient denies   . Diverticulosis   . Dupuytren contracture    Right hand, has received injection  . GERD (gastroesophageal reflux disease)   . H/O tinnitus    left  . Hearing loss    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  . Hemorrhoids   . History of colon polyps   . History of endometriosis   . History of hiatal hernia   . History of uterine fibroid   . History of UTI   . Hypertension   . Memory loss   . Metabolic encephalopathy    History of: resolved  . Migraine   . Recurrent pneumonia 07/2018  . Seasonal allergies   . Vitamin D deficiency   . Wears glasses     Her Past  Surgical History Is Significant For: Past Surgical History:  Procedure Laterality Date  . ABDOMINAL HYSTERECTOMY    . BACK SURGERY    . BIOPSY  10/20/2018   Procedure: BIOPSY;  Surgeon: [name], MD;  Location: [facility];  Service: Endoscopy;;  . BLADDER SUSPENSION    . COLONOSCOPY  02/16/2008   normal  . DENTAL SURGERY    . ESOPHAGOGASTRODUODENOSCOPY (EGD) WITH PROPOFOL N/A 10/20/2018   Procedure: ESOPHAGOGASTRODUODENOSCOPY (EGD) WITH PROPOFOL;  Surgeon: [name], MD;  Location: [facility];  Service: Endoscopy;  Laterality: N/A;  . EUS N/A 10/20/2018   Procedure: UPPER ENDOSCOPIC ULTRASOUND (EUS) RADIAL;  Surgeon: [name], MD;  Location: [facility];  Service: Endoscopy;  Laterality: N/A;  . FOOT SURGERY Left    x3  . laparoscopy     for evaluation of endometriosis  . TOTAL KNEE ARTHROPLASTY Left 07/15/2015   Procedure: TOTAL LEFT KNEE ARTHROPLASTY;  Surgeon: Paralee Cancel, MD;  Location: [facility];  Service: Orthopedics;  Laterality: Left;  . UPPER GI ENDOSCOPY      Her Family History Is Significant For: Family History  Problem Relation Age of Onset  . Emphysema Mother   . Heart disease Mother 34       MI    Her Social History Is Significant For: Social History   Socioeconomic History  . Marital status: Married    Spouse name: Not on file  . Number of children: 2  . Years of education: 2  . Highest education level: Not on file  Occupational History  . Occupation: Futures trader  Tobacco Use  . Smoking status: Never Smoker  . Smokeless tobacco: Never Used  Substance and Sexual Activity  . Alcohol use: No    Alcohol/week: 0.0 standard drinks    Comment: As of 07/2018, it is been several years since she drank any alcohol.  . Drug use: No  . Sexual activity: Yes    Partners: Male    Birth control/protection: Surgical  Other Topics Concern  . Not on file  Social History Narrative   HSG, [employer] BS interior design. Married 03-Jul-1977 - 23 years,  her husband died in 07/03/1997.  She has been with her current, second husband since around 07-03-2008.  1 dtr - 07/03/82 - lawyer, 1 son 07-04-87. No h/o abuse.  Social Determinants of Health   Financial Resource Strain:   . Difficulty of Paying Living Expenses: Not on file  Food Insecurity:   . Worried About Charity fundraiser in the Last Year: Not on file  . Ran Out of Food in the Last Year: Not on file  Transportation Needs:   . Lack of Transportation (Medical): Not on file  . Lack of Transportation (Non-Medical): Not on file  Physical Activity:   . Days of Exercise per Week: Not on file  . Minutes of Exercise per Session: Not on file  Stress:   . Feeling of Stress : Not on file  Social Connections:   . Frequency of Communication with Friends and Family: Not on file  . Frequency of Social Gatherings with Friends and Family: Not on file  . Attends Religious Services: Not on file  . Active Member of Clubs or Organizations: Not on file  . Attends Archivist Meetings: Not on file  . Marital Status: Not on file    Her Allergies Are:  Allergies  Allergen Reactions  . Penicillins Shortness Of Breath and Rash    Did it involve swelling of the face/tongue/throat, SOB, or low BP? No Did it involve sudden or severe rash/hives, skin peeling, or any reaction on the inside of your mouth or nose? Yes Did you need to seek medical attention at a hospital or doctor's office? No When did it last happen?10 + years If all above answers are "NO", may proceed with cephalosporin use.    [name] Morphine And Related Swelling    Sedation/Swelling described like edema/bloating/doesn't help the pain Morphine only; tolerates other opioids.   . Cefepime Rash    Reaction while taking both cefepime and vancomycin at [facility] (after 3 days)  . Vancomycin Rash    Reaction while taking both cefepime and vancomycin at [facility] (after 3 days)  :   Her Current Medications Are:  Outpatient Encounter Medications as of  06/29/2019  Medication Sig  . acetaminophen (TYLENOL) 650 MG CR tablet Take 1,300 mg by mouth every 8 (eight) hours as needed for pain.  [name] albuterol (VENTOLIN HFA) 108 (90 Base) MCG/ACT inhaler INHALE 2 PUFFS INTO THE LUNGS EVERY 4 HOURS AS NEEDED FOR WHEEZING OR SHORTNESS OF BREATH (Patient taking differently: Inhale 2 puffs into the lungs every 4 (four) hours as needed for wheezing or shortness of breath. )  . atenolol (TENORMIN) 100 MG tablet TAKE 1 TABLET(100 MG) BY MOUTH EVERY DAY  . Carboxymethylcellulose Sodium (RETAINE CMC OP) Place 1 application into both eyes at bedtime.  . Cholecalciferol (VITAMIN D) 2000 units tablet Take 2,000 Units by mouth at bedtime.   . diazepam (VALIUM) 5 MG tablet TAKE 1 TABLET BY MOUTH AS NEEDED FOR ANXIETY OR MUSCLE SPASMS  . diltiazem (CARDIZEM CD) 180 MG 24 hr capsule TAKE 1 CAPSULE(180 MG) BY MOUTH DAILY  . fluticasone (FLONASE) 50 MCG/ACT nasal spray Place 2 sprays into both nostrils daily as needed for allergies or rhinitis.  . Multiple Vitamin (MULTIVITAMIN WITH MINERALS) TABS tablet Take 2 tablets by mouth daily.   [name] oxymorphone (OPANA) 10 MG tablet Take 10 mg by mouth 2 (two) times daily as needed for pain.   . pantoprazole (PROTONIX) 40 MG tablet TAKE 1 TABLET(40 MG) BY MOUTH TWICE DAILY BEFORE A MEAL  . PARoxetine (PAXIL) 40 MG tablet TAKE 1 TABLET(40 MG) BY MOUTH TWICE DAILY  . Polyethyl Glycol-Propyl Glycol (SYSTANE OP) Place 1 drop into both eyes daily  as needed (dry eyes).  . polyethylene glycol (MIRALAX / GLYCOLAX) packet Take 17 g by mouth 2 (two) times daily. (Patient taking differently: Take 17 g by mouth daily as needed (constipation). Mix in 8 oz liquid and drink)  . potassium chloride (KLOR-CON) 10 MEQ tablet TAKE 1 TABLET(10 MEQ) BY MOUTH DAILY  . Probiotic CAPS Take 1 capsule by mouth daily.  . rosuvastatin (CRESTOR) 5 MG tablet TAKE 1 TABLET(5 MG) BY MOUTH DAILY (Patient taking differently: Take 5 mg by mouth daily. )  . SUMAtriptan  (IMITREX) 100 MG tablet TAKE 1 TABLET BY MOUTH DAILY AS NEEDED FOR MIGRAINE. MAY REPEAT IN 2 HOURS IF HEADACHE PERSISTS OR RECURS (Patient taking differently: Take 100 mg by mouth every 2 (two) hours as needed for migraine. )  . traZODone (DESYREL) 150 MG tablet TAKE 1 TABLET(150 MG) BY MOUTH AT BEDTIME  . vitamin B-12 (CYANOCOBALAMIN) 500 MCG tablet Take 500 mcg by mouth daily.  [name] gabapentin (NEURONTIN) 300 MG capsule Take 1 capsule (300 mg total) by mouth 3 (three) times daily for 30 days. (Patient taking differently: Take 600-900 mg by mouth at bedtime. )   No facility-administered encounter medications on file as of 06/29/2019.  :  Review of Systems:  Out of a complete 14 point review of systems, all are reviewed and negative with the exception of these symptoms as listed below:   Review of Systems  Neurological:       Reports she has been stable since last visit.     Objective:  Neurological Exam  Physical Exam Physical Examination:   Vitals:   06/29/19 1311  BP: 134/86  Pulse: 60  Temp: 97.6 F (36.4 C)   General Examination: The patient is a very pleasant 69 y.o. female in no acute distress. She appears well-developed and well-nourished and well groomed.   HEENT:Normocephalic, atraumatic, pupils are equal, round and reactive to light and accommodation. Corrective eye glasses in place. Extraocular tracking is good without limitation to gaze excursion or nystagmus noted. Normal smooth pursuit is noted. Hearing is grossly intact. Face is symmetric with normal facial animation and normal facial sensation. Speech is clear with no dysarthria noted. There is no hypophonia. There is no lip, neck/head, jaw or voice tremor. Neck is supple with full range of passive and active motion. There are no carotid bruits on auscultation.   Chest:Clear to auscultation without wheezing, rhonchi or crackles noted.  Heart:S1+S2+0, regular and normal without murmurs, rubs or gallops noted.    Abdomen:Soft, non-tender and non-distended with normal bowel sounds appreciated on auscultation.  Extremities:There is no obvious edema.   Skin: Warm and dry without trophic changes noted.  Musculoskeletal: exam reveals no obvious joint deformities, tenderness or joint swelling or erythema.   Neurologically:  Mental status: The patient is awake, alert and orientedexcept for exact date.Herimmediate and remote memory, attention, language skills and fund of knowledge are fairlyappropriate. There is no evidence of aphasia, agnosia, apraxia or anomia. Speech is clear with normal prosody and enunciation. Thought process is linear. Mood isnormaland affect is normal.  On7/23/2019: MMSE: 25/30,she declined serial sevens and could not fully spell the word world backwards. She was not fully oriented to exact date. CDT: 4/4, AFT: 10/min.  On2/25/2020: MMSE: 29/30, she declined serial 7s, CDT: 3/4, AFT: 13/min.  On 12/27/2018: MMSE: 27/30. CDT: 4/4, AFT: 14/min.  On 06/29/2019: MMSE: 29/30, CDT: 4/4, AFT: 15/min.  Cranial nerves II - XII are as described above under HEENT exam.  Motor exam: Normal  bulk, strength and tone is noted. There is no tremor. Fine motor skills and coordination:grosslyintact.  Cerebellar testing: No dysmetria or intention tremor. There is no truncal or gait ataxia.  Sensory exam: intact to light touch in the upper and lower extremities.  Gait, station and balance:Shestands easily. No veering to one side is noted. No leaning to one side is noted. Posture isslightly stooped, mild scoliosis possibly. She walks cautiously and slowly, no change.  Assessmentand Plan:  In summary,Bianca Haley a very pleasant 65 year oldfemalewith an underlying mecal history of hypertension, history of migraines, reflux disease, back pain, on chronic narcotic pain medications, arthritis, anxiety, depression and vitamin D deficiency, whopresents forfollow up  consultation of hermemory loss. Findings are generally quite benign, her memory loss is in the mild range, and scores are stable. She had neuropsychological testing with Dr. Sima Matas With findings supportive of age-appropriate memory loss.  We can continue to monitor, her sleep study did not show any sleep apnea but she did have oxygen desaturations throughout the night.  She was encouraged again today to establish care again with pulmonology. We mutually agreed to have her follow-up routinely in 1 year for a recheck with me, she may benefit from reevaluation with her cognitive evaluation as well, we will consider it at the next visit. I answered all her questions today and she was in agreement. I spent 30 minutes in total face-to-face time and in reviewing records during pre-charting, more than 50% of which was spent in counseling and coordination of care, reviewing test results, reviewing medications and treatment regimen and/or in discussing or reviewing the diagnosis of memory loss, the prognosis and treatment options. Pertinent laboratory and imaging test results that were available during this visit with the patient were reviewed by me and considered in my medical decision making (see chart for details).

## 2019-07-24 DIAGNOSIS — M76892 Other specified enthesopathies of left lower limb, excluding foot: Secondary | ICD-10-CM | POA: Diagnosis not present

## 2019-07-24 DIAGNOSIS — Z96652 Presence of left artificial knee joint: Secondary | ICD-10-CM | POA: Diagnosis not present

## 2019-07-24 DIAGNOSIS — M25562 Pain in left knee: Secondary | ICD-10-CM | POA: Diagnosis not present

## 2019-07-28 ENCOUNTER — Other Ambulatory Visit: Payer: Self-pay | Admitting: Internal Medicine

## 2019-08-09 ENCOUNTER — Other Ambulatory Visit: Payer: Self-pay | Admitting: Internal Medicine

## 2019-08-09 NOTE — Telephone Encounter (Signed)
Check [place] registry last filled 07/09/2019../l,mb

## 2019-08-23 DIAGNOSIS — Z96652 Presence of left artificial knee joint: Secondary | ICD-10-CM | POA: Diagnosis not present

## 2019-08-23 DIAGNOSIS — M25561 Pain in right knee: Secondary | ICD-10-CM | POA: Diagnosis not present

## 2019-08-23 DIAGNOSIS — M1711 Unilateral primary osteoarthritis, right knee: Secondary | ICD-10-CM | POA: Diagnosis not present

## 2019-08-23 DIAGNOSIS — M7052 Other bursitis of knee, left knee: Secondary | ICD-10-CM | POA: Diagnosis not present

## 2019-08-28 ENCOUNTER — Ambulatory Visit: Payer: Medicare Other | Admitting: Primary Care

## 2019-09-07 DIAGNOSIS — M5136 Other intervertebral disc degeneration, lumbar region: Secondary | ICD-10-CM | POA: Diagnosis not present

## 2019-09-21 ENCOUNTER — Ambulatory Visit: Payer: Medicare Other | Admitting: Pulmonary Disease

## 2019-10-30 ENCOUNTER — Other Ambulatory Visit: Payer: Self-pay

## 2019-10-30 ENCOUNTER — Encounter: Payer: Self-pay | Admitting: Pulmonary Disease

## 2019-10-30 ENCOUNTER — Ambulatory Visit (INDEPENDENT_AMBULATORY_CARE_PROVIDER_SITE_OTHER): Payer: Medicare Other | Admitting: Pulmonary Disease

## 2019-10-30 VITALS — BP 138/80 | HR 88 | Temp 98.2°F | Ht 65.5 in | Wt 158.8 lb

## 2019-10-30 DIAGNOSIS — G4734 Idiopathic sleep related nonobstructive alveolar hypoventilation: Secondary | ICD-10-CM

## 2019-10-30 DIAGNOSIS — J189 Pneumonia, unspecified organism: Secondary | ICD-10-CM | POA: Diagnosis not present

## 2019-10-30 NOTE — Assessment & Plan Note (Signed)
Previously considered to be related to aspiration whether due to GERD , wonder if narcotics contributing.  She feels that she has cut down Opana usage significantly and as needed for chronic back pain.  She sleeps in a recliner at a 45 degree position so incidence of reflux has decreased significantly.  At any rate she has not had an episode of pneumonia in the last 2 years

## 2019-10-30 NOTE — Patient Instructions (Signed)
Nocturnal oximetry   If this shows oxygen drop less than 88% for significant part of the night, then you may benefit from using oxygen during sleep.  If so, we will schedule PFTs

## 2019-10-30 NOTE — Assessment & Plan Note (Signed)
Reviewed N PSG, she required 1 L of oxygen during the study for significant desaturations.  We will repeat Nocturnal oximetry   If this shows oxygen drop less than 88% for significant part of the night, then you may benefit from using oxygen during sleep.  If so, we will schedule PFTs Not sure what is the cause of her pulmonary disease, she does not seem to have significant obstructive lung disease or ILD.  Very likely related to recurrent pneumonias although her last CT scan does not show significant damage

## 2019-10-30 NOTE — Progress Notes (Signed)
   Subjective:    Patient ID: Bianca Haley, female    DOB: 06-Jun-1950, 69 y.o.   MRN: [id number]  HPI  69 yo never smoker with recurrent pneumonia attributed to aspiration ? due to narcotics & esophageal dysmotility -first seen for chronic cough x 40mnths on 02/21/13   She underwent sleep study by neurology and was found to have significant O2 desaturation, 1 L of oxygen was advised and use during the study.  She would like to know if there has been any permanent damage to her lungs from recurrent pneumonias  We reviewed last CT chest from 07/2018 she has had no further episodes of pneumonia since 2019 She remains on Opana, listed as 10 mg twice daily as needed but she uses only half tablet as needed and certain days he is able to go without this.  She remains on trazodone and Neurontin    Significant tests/ events reviewed  N PSG 01/2019 TST 328 minutes , weight 157 pounds, AHI 1.5/hour. Oxygen saturation less than 89% for 84% of the time, corrected by 1 L oxygen  CT angio 07/2018 Patchy areas of very subtle ground-glass attenuation are noted throughout the lungs bilaterally   02/2013 CT chest showed RML/RLL pneumonia and what appeared to be some progression of bronchiectasis (first noted 2011)   Esophagram was negative. IgG,IgaA and IgM levels were low, but not immunosuppressed.   09/2013 admitted for recurrent PNA /aspiration pneumonitis . CXR showed R>L consolidation c/w multifocal PNA.  MBS >> dysphagia but raised concerns of possible primary esophageal dysmotility as barium pill stopped mid esophagus requiring additional thin barium to facilitate transit to stomach , PPI to BID prior to discharge.    04/2014 EGD showed mild esophagitis.  2015 BL severe pneumonia when visiting son in [place] Patient denies significant dyspnea,cough, hemoptysis,  chest pain, palpitations, pedal edema, orthopnea, paroxysmal nocturnal dyspnea,  lightheadedness, nausea, vomiting, abdominal or  leg pains      Objective:   Physical Exam  Gen. Pleasant, well-nourished, in no distress ENT - no thrush, no pallor/icterus,no post nasal drip Neck: No JVD, no thyromegaly, no carotid bruits Lungs: no use of accessory muscles, no dullness to percussion, clear without rales or rhonchi  Cardiovascular: Rhythm regular, heart sounds  normal, no murmurs or gallops, no peripheral edema Musculoskeletal: No deformities, no cyanosis or clubbing        Assessment & Plan:

## 2019-11-03 ENCOUNTER — Ambulatory Visit: Payer: Medicare Other | Admitting: Psychology

## 2019-11-03 DIAGNOSIS — R262 Difficulty in walking, not elsewhere classified: Secondary | ICD-10-CM | POA: Diagnosis not present

## 2019-11-03 DIAGNOSIS — M6281 Muscle weakness (generalized): Secondary | ICD-10-CM | POA: Diagnosis not present

## 2019-11-03 DIAGNOSIS — M25562 Pain in left knee: Secondary | ICD-10-CM | POA: Diagnosis not present

## 2019-11-03 DIAGNOSIS — M545 Low back pain: Secondary | ICD-10-CM | POA: Diagnosis not present

## 2019-11-07 DIAGNOSIS — M25562 Pain in left knee: Secondary | ICD-10-CM | POA: Diagnosis not present

## 2019-11-07 DIAGNOSIS — R262 Difficulty in walking, not elsewhere classified: Secondary | ICD-10-CM | POA: Diagnosis not present

## 2019-11-07 DIAGNOSIS — M545 Low back pain: Secondary | ICD-10-CM | POA: Diagnosis not present

## 2019-11-07 DIAGNOSIS — M6281 Muscle weakness (generalized): Secondary | ICD-10-CM | POA: Diagnosis not present

## 2019-11-10 DIAGNOSIS — M6281 Muscle weakness (generalized): Secondary | ICD-10-CM | POA: Diagnosis not present

## 2019-11-10 DIAGNOSIS — M25562 Pain in left knee: Secondary | ICD-10-CM | POA: Diagnosis not present

## 2019-11-10 DIAGNOSIS — M545 Low back pain: Secondary | ICD-10-CM | POA: Diagnosis not present

## 2019-11-10 DIAGNOSIS — R262 Difficulty in walking, not elsewhere classified: Secondary | ICD-10-CM | POA: Diagnosis not present

## 2019-11-14 DIAGNOSIS — H43811 Vitreous degeneration, right eye: Secondary | ICD-10-CM | POA: Diagnosis not present

## 2019-11-14 DIAGNOSIS — M545 Low back pain: Secondary | ICD-10-CM | POA: Diagnosis not present

## 2019-11-14 DIAGNOSIS — H35371 Puckering of macula, right eye: Secondary | ICD-10-CM | POA: Diagnosis not present

## 2019-11-14 DIAGNOSIS — R262 Difficulty in walking, not elsewhere classified: Secondary | ICD-10-CM | POA: Diagnosis not present

## 2019-11-14 DIAGNOSIS — M25562 Pain in left knee: Secondary | ICD-10-CM | POA: Diagnosis not present

## 2019-11-14 DIAGNOSIS — H35362 Drusen (degenerative) of macula, left eye: Secondary | ICD-10-CM | POA: Diagnosis not present

## 2019-11-14 DIAGNOSIS — M6281 Muscle weakness (generalized): Secondary | ICD-10-CM | POA: Diagnosis not present

## 2019-11-14 DIAGNOSIS — H35033 Hypertensive retinopathy, bilateral: Secondary | ICD-10-CM | POA: Diagnosis not present

## 2019-11-15 ENCOUNTER — Encounter: Payer: Self-pay | Admitting: Pulmonary Disease

## 2019-11-15 DIAGNOSIS — R0902 Hypoxemia: Secondary | ICD-10-CM | POA: Diagnosis not present

## 2019-11-15 DIAGNOSIS — J449 Chronic obstructive pulmonary disease, unspecified: Secondary | ICD-10-CM | POA: Diagnosis not present

## 2019-11-16 DIAGNOSIS — M25562 Pain in left knee: Secondary | ICD-10-CM | POA: Diagnosis not present

## 2019-11-16 DIAGNOSIS — M545 Low back pain: Secondary | ICD-10-CM | POA: Diagnosis not present

## 2019-11-16 DIAGNOSIS — R262 Difficulty in walking, not elsewhere classified: Secondary | ICD-10-CM | POA: Diagnosis not present

## 2019-11-16 DIAGNOSIS — M6281 Muscle weakness (generalized): Secondary | ICD-10-CM | POA: Diagnosis not present

## 2019-11-21 DIAGNOSIS — M25562 Pain in left knee: Secondary | ICD-10-CM | POA: Diagnosis not present

## 2019-11-21 DIAGNOSIS — M545 Low back pain: Secondary | ICD-10-CM | POA: Diagnosis not present

## 2019-11-21 DIAGNOSIS — R262 Difficulty in walking, not elsewhere classified: Secondary | ICD-10-CM | POA: Diagnosis not present

## 2019-11-21 DIAGNOSIS — M6281 Muscle weakness (generalized): Secondary | ICD-10-CM | POA: Diagnosis not present

## 2019-11-23 DIAGNOSIS — R262 Difficulty in walking, not elsewhere classified: Secondary | ICD-10-CM | POA: Diagnosis not present

## 2019-11-23 DIAGNOSIS — M545 Low back pain: Secondary | ICD-10-CM | POA: Diagnosis not present

## 2019-11-23 DIAGNOSIS — M6281 Muscle weakness (generalized): Secondary | ICD-10-CM | POA: Diagnosis not present

## 2019-11-23 DIAGNOSIS — M25562 Pain in left knee: Secondary | ICD-10-CM | POA: Diagnosis not present

## 2019-11-24 ENCOUNTER — Other Ambulatory Visit: Payer: Self-pay | Admitting: Internal Medicine

## 2019-11-25 ENCOUNTER — Telehealth: Payer: Self-pay | Admitting: Pulmonary Disease

## 2019-11-25 NOTE — Telephone Encounter (Signed)
ONO showed desatn < 88% through the night Start 1 L O2 during sleep

## 2019-11-27 DIAGNOSIS — M545 Low back pain: Secondary | ICD-10-CM | POA: Diagnosis not present

## 2019-11-27 DIAGNOSIS — M25562 Pain in left knee: Secondary | ICD-10-CM | POA: Diagnosis not present

## 2019-11-27 DIAGNOSIS — R262 Difficulty in walking, not elsewhere classified: Secondary | ICD-10-CM | POA: Diagnosis not present

## 2019-11-27 DIAGNOSIS — M6281 Muscle weakness (generalized): Secondary | ICD-10-CM | POA: Diagnosis not present

## 2019-12-01 DIAGNOSIS — M25562 Pain in left knee: Secondary | ICD-10-CM | POA: Diagnosis not present

## 2019-12-01 DIAGNOSIS — M545 Low back pain: Secondary | ICD-10-CM | POA: Diagnosis not present

## 2019-12-01 DIAGNOSIS — M6281 Muscle weakness (generalized): Secondary | ICD-10-CM | POA: Diagnosis not present

## 2019-12-01 DIAGNOSIS — R262 Difficulty in walking, not elsewhere classified: Secondary | ICD-10-CM | POA: Diagnosis not present

## 2019-12-05 DIAGNOSIS — M25562 Pain in left knee: Secondary | ICD-10-CM | POA: Diagnosis not present

## 2019-12-05 DIAGNOSIS — R262 Difficulty in walking, not elsewhere classified: Secondary | ICD-10-CM | POA: Diagnosis not present

## 2019-12-05 DIAGNOSIS — M6281 Muscle weakness (generalized): Secondary | ICD-10-CM | POA: Diagnosis not present

## 2019-12-05 DIAGNOSIS — M545 Low back pain: Secondary | ICD-10-CM | POA: Diagnosis not present

## 2019-12-06 DIAGNOSIS — M5136 Other intervertebral disc degeneration, lumbar region: Secondary | ICD-10-CM | POA: Diagnosis not present

## 2019-12-06 DIAGNOSIS — Z981 Arthrodesis status: Secondary | ICD-10-CM | POA: Diagnosis not present

## 2019-12-06 DIAGNOSIS — M5441 Lumbago with sciatica, right side: Secondary | ICD-10-CM | POA: Diagnosis not present

## 2019-12-07 DIAGNOSIS — M545 Low back pain: Secondary | ICD-10-CM | POA: Diagnosis not present

## 2019-12-07 DIAGNOSIS — M25562 Pain in left knee: Secondary | ICD-10-CM | POA: Diagnosis not present

## 2019-12-07 DIAGNOSIS — M6281 Muscle weakness (generalized): Secondary | ICD-10-CM | POA: Diagnosis not present

## 2019-12-07 DIAGNOSIS — R262 Difficulty in walking, not elsewhere classified: Secondary | ICD-10-CM | POA: Diagnosis not present

## 2019-12-12 DIAGNOSIS — M6281 Muscle weakness (generalized): Secondary | ICD-10-CM | POA: Diagnosis not present

## 2019-12-12 DIAGNOSIS — M545 Low back pain: Secondary | ICD-10-CM | POA: Diagnosis not present

## 2019-12-12 DIAGNOSIS — R262 Difficulty in walking, not elsewhere classified: Secondary | ICD-10-CM | POA: Diagnosis not present

## 2019-12-12 DIAGNOSIS — M25562 Pain in left knee: Secondary | ICD-10-CM | POA: Diagnosis not present

## 2019-12-14 DIAGNOSIS — R262 Difficulty in walking, not elsewhere classified: Secondary | ICD-10-CM | POA: Diagnosis not present

## 2019-12-14 DIAGNOSIS — M6281 Muscle weakness (generalized): Secondary | ICD-10-CM | POA: Diagnosis not present

## 2019-12-14 DIAGNOSIS — M545 Low back pain: Secondary | ICD-10-CM | POA: Diagnosis not present

## 2019-12-14 DIAGNOSIS — M25562 Pain in left knee: Secondary | ICD-10-CM | POA: Diagnosis not present

## 2019-12-15 ENCOUNTER — Other Ambulatory Visit: Payer: Self-pay | Admitting: Pulmonary Disease

## 2019-12-15 DIAGNOSIS — G4734 Idiopathic sleep related nonobstructive alveolar hypoventilation: Secondary | ICD-10-CM

## 2019-12-15 NOTE — Telephone Encounter (Signed)
Pt was informed of ono results dme order sent to [facility]

## 2019-12-21 DIAGNOSIS — R262 Difficulty in walking, not elsewhere classified: Secondary | ICD-10-CM | POA: Diagnosis not present

## 2019-12-21 DIAGNOSIS — M545 Low back pain: Secondary | ICD-10-CM | POA: Diagnosis not present

## 2019-12-21 DIAGNOSIS — M6281 Muscle weakness (generalized): Secondary | ICD-10-CM | POA: Diagnosis not present

## 2019-12-21 DIAGNOSIS — M25562 Pain in left knee: Secondary | ICD-10-CM | POA: Diagnosis not present

## 2019-12-26 DIAGNOSIS — M25562 Pain in left knee: Secondary | ICD-10-CM | POA: Diagnosis not present

## 2019-12-26 DIAGNOSIS — R262 Difficulty in walking, not elsewhere classified: Secondary | ICD-10-CM | POA: Diagnosis not present

## 2019-12-26 DIAGNOSIS — M6281 Muscle weakness (generalized): Secondary | ICD-10-CM | POA: Diagnosis not present

## 2019-12-26 DIAGNOSIS — M545 Low back pain: Secondary | ICD-10-CM | POA: Diagnosis not present

## 2019-12-28 DIAGNOSIS — R262 Difficulty in walking, not elsewhere classified: Secondary | ICD-10-CM | POA: Diagnosis not present

## 2019-12-28 DIAGNOSIS — M545 Low back pain: Secondary | ICD-10-CM | POA: Diagnosis not present

## 2019-12-28 DIAGNOSIS — M6281 Muscle weakness (generalized): Secondary | ICD-10-CM | POA: Diagnosis not present

## 2019-12-28 DIAGNOSIS — M25562 Pain in left knee: Secondary | ICD-10-CM | POA: Diagnosis not present

## 2020-01-02 DIAGNOSIS — M25562 Pain in left knee: Secondary | ICD-10-CM | POA: Diagnosis not present

## 2020-01-02 DIAGNOSIS — R262 Difficulty in walking, not elsewhere classified: Secondary | ICD-10-CM | POA: Diagnosis not present

## 2020-01-02 DIAGNOSIS — M545 Low back pain: Secondary | ICD-10-CM | POA: Diagnosis not present

## 2020-01-02 DIAGNOSIS — M6281 Muscle weakness (generalized): Secondary | ICD-10-CM | POA: Diagnosis not present

## 2020-01-04 DIAGNOSIS — M545 Low back pain: Secondary | ICD-10-CM | POA: Diagnosis not present

## 2020-01-04 DIAGNOSIS — R262 Difficulty in walking, not elsewhere classified: Secondary | ICD-10-CM | POA: Diagnosis not present

## 2020-01-04 DIAGNOSIS — M6281 Muscle weakness (generalized): Secondary | ICD-10-CM | POA: Diagnosis not present

## 2020-01-04 DIAGNOSIS — M25562 Pain in left knee: Secondary | ICD-10-CM | POA: Diagnosis not present

## 2020-01-10 DIAGNOSIS — R262 Difficulty in walking, not elsewhere classified: Secondary | ICD-10-CM | POA: Diagnosis not present

## 2020-01-10 DIAGNOSIS — M6281 Muscle weakness (generalized): Secondary | ICD-10-CM | POA: Diagnosis not present

## 2020-01-10 DIAGNOSIS — M25562 Pain in left knee: Secondary | ICD-10-CM | POA: Diagnosis not present

## 2020-01-10 DIAGNOSIS — M545 Low back pain: Secondary | ICD-10-CM | POA: Diagnosis not present

## 2020-01-12 DIAGNOSIS — M6281 Muscle weakness (generalized): Secondary | ICD-10-CM | POA: Diagnosis not present

## 2020-01-12 DIAGNOSIS — M545 Low back pain: Secondary | ICD-10-CM | POA: Diagnosis not present

## 2020-01-12 DIAGNOSIS — M25562 Pain in left knee: Secondary | ICD-10-CM | POA: Diagnosis not present

## 2020-01-12 DIAGNOSIS — R262 Difficulty in walking, not elsewhere classified: Secondary | ICD-10-CM | POA: Diagnosis not present

## 2020-01-18 DIAGNOSIS — R262 Difficulty in walking, not elsewhere classified: Secondary | ICD-10-CM | POA: Diagnosis not present

## 2020-01-18 DIAGNOSIS — M6281 Muscle weakness (generalized): Secondary | ICD-10-CM | POA: Diagnosis not present

## 2020-01-18 DIAGNOSIS — M545 Low back pain: Secondary | ICD-10-CM | POA: Diagnosis not present

## 2020-01-18 DIAGNOSIS — M25562 Pain in left knee: Secondary | ICD-10-CM | POA: Diagnosis not present

## 2020-01-22 DIAGNOSIS — M5412 Radiculopathy, cervical region: Secondary | ICD-10-CM | POA: Diagnosis not present

## 2020-01-23 ENCOUNTER — Other Ambulatory Visit: Payer: Self-pay | Admitting: Internal Medicine

## 2020-01-23 DIAGNOSIS — M25562 Pain in left knee: Secondary | ICD-10-CM | POA: Diagnosis not present

## 2020-01-23 DIAGNOSIS — R262 Difficulty in walking, not elsewhere classified: Secondary | ICD-10-CM | POA: Diagnosis not present

## 2020-01-23 DIAGNOSIS — M6281 Muscle weakness (generalized): Secondary | ICD-10-CM | POA: Diagnosis not present

## 2020-01-23 DIAGNOSIS — M545 Low back pain: Secondary | ICD-10-CM | POA: Diagnosis not present

## 2020-01-24 ENCOUNTER — Other Ambulatory Visit: Payer: Self-pay | Admitting: Internal Medicine

## 2020-01-30 DIAGNOSIS — M6281 Muscle weakness (generalized): Secondary | ICD-10-CM | POA: Diagnosis not present

## 2020-01-30 DIAGNOSIS — R262 Difficulty in walking, not elsewhere classified: Secondary | ICD-10-CM | POA: Diagnosis not present

## 2020-01-30 DIAGNOSIS — M545 Low back pain: Secondary | ICD-10-CM | POA: Diagnosis not present

## 2020-01-30 DIAGNOSIS — M25562 Pain in left knee: Secondary | ICD-10-CM | POA: Diagnosis not present

## 2020-02-06 DIAGNOSIS — M25562 Pain in left knee: Secondary | ICD-10-CM | POA: Diagnosis not present

## 2020-02-06 DIAGNOSIS — R262 Difficulty in walking, not elsewhere classified: Secondary | ICD-10-CM | POA: Diagnosis not present

## 2020-02-06 DIAGNOSIS — M6281 Muscle weakness (generalized): Secondary | ICD-10-CM | POA: Diagnosis not present

## 2020-02-06 DIAGNOSIS — Z96652 Presence of left artificial knee joint: Secondary | ICD-10-CM | POA: Diagnosis not present

## 2020-02-08 ENCOUNTER — Other Ambulatory Visit: Payer: Self-pay | Admitting: Internal Medicine

## 2020-02-13 DIAGNOSIS — R262 Difficulty in walking, not elsewhere classified: Secondary | ICD-10-CM | POA: Diagnosis not present

## 2020-02-13 DIAGNOSIS — M25562 Pain in left knee: Secondary | ICD-10-CM | POA: Diagnosis not present

## 2020-02-13 DIAGNOSIS — M6281 Muscle weakness (generalized): Secondary | ICD-10-CM | POA: Diagnosis not present

## 2020-02-13 DIAGNOSIS — Z96652 Presence of left artificial knee joint: Secondary | ICD-10-CM | POA: Diagnosis not present

## 2020-03-04 ENCOUNTER — Other Ambulatory Visit: Payer: Self-pay | Admitting: Internal Medicine

## 2020-03-04 DIAGNOSIS — M5412 Radiculopathy, cervical region: Secondary | ICD-10-CM | POA: Diagnosis not present

## 2020-03-04 DIAGNOSIS — Z981 Arthrodesis status: Secondary | ICD-10-CM | POA: Diagnosis not present

## 2020-03-04 DIAGNOSIS — M542 Cervicalgia: Secondary | ICD-10-CM | POA: Diagnosis not present

## 2020-03-06 ENCOUNTER — Other Ambulatory Visit: Payer: Self-pay | Admitting: Internal Medicine

## 2020-03-15 DIAGNOSIS — Z23 Encounter for immunization: Secondary | ICD-10-CM | POA: Diagnosis not present

## 2020-03-20 ENCOUNTER — Other Ambulatory Visit: Payer: Self-pay | Admitting: Internal Medicine

## 2020-03-20 NOTE — Telephone Encounter (Signed)
Check [name] registry last filled 01/23/2020.Marland KitchenJohny Haley

## 2020-03-23 ENCOUNTER — Other Ambulatory Visit: Payer: Self-pay | Admitting: Internal Medicine

## 2020-03-26 ENCOUNTER — Other Ambulatory Visit: Payer: Self-pay | Admitting: Internal Medicine

## 2020-03-26 ENCOUNTER — Telehealth: Payer: Self-pay | Admitting: Internal Medicine

## 2020-03-26 MED ORDER — DILTIAZEM HCL ER COATED BEADS 180 MG PO CP24: ORAL_CAPSULE | ORAL | 0 refills | Status: DC

## 2020-03-26 NOTE — Telephone Encounter (Signed)
Per office policy sent 30 day to local pharmacy until appt.../lmb  

## 2020-03-26 NOTE — Telephone Encounter (Signed)
diltiazem (CARDIZEM CD) 180 MG 24 hr capsule [id number] - [place], [place] - [address] Phone:  [phone]  Fax:  [phone]     Requesting a refill Last apt- 9.28.20 Next apt- 12.01.21

## 2020-03-27 ENCOUNTER — Other Ambulatory Visit: Payer: Self-pay | Admitting: Internal Medicine

## 2020-04-02 ENCOUNTER — Other Ambulatory Visit: Payer: Self-pay

## 2020-04-03 ENCOUNTER — Encounter: Payer: Self-pay | Admitting: Internal Medicine

## 2020-04-03 ENCOUNTER — Ambulatory Visit (INDEPENDENT_AMBULATORY_CARE_PROVIDER_SITE_OTHER): Payer: Medicare Other | Admitting: Internal Medicine

## 2020-04-03 VITALS — BP 134/80 | HR 56 | Temp 98.9°F | Ht 65.5 in | Wt 150.4 lb

## 2020-04-03 DIAGNOSIS — R202 Paresthesia of skin: Secondary | ICD-10-CM | POA: Diagnosis not present

## 2020-04-03 DIAGNOSIS — N959 Unspecified menopausal and perimenopausal disorder: Secondary | ICD-10-CM

## 2020-04-03 DIAGNOSIS — E785 Hyperlipidemia, unspecified: Secondary | ICD-10-CM

## 2020-04-03 DIAGNOSIS — Z23 Encounter for immunization: Secondary | ICD-10-CM | POA: Diagnosis not present

## 2020-04-03 DIAGNOSIS — N1832 Chronic kidney disease, stage 3b: Secondary | ICD-10-CM

## 2020-04-03 DIAGNOSIS — I10 Essential (primary) hypertension: Secondary | ICD-10-CM

## 2020-04-03 DIAGNOSIS — E559 Vitamin D deficiency, unspecified: Secondary | ICD-10-CM | POA: Diagnosis not present

## 2020-04-03 LAB — COMPREHENSIVE METABOLIC PANEL
ALT: 12 U/L (ref 0–35)
AST: 21 U/L (ref 0–37)
Albumin: 4.3 g/dL (ref 3.5–5.2)
Alkaline Phosphatase: 82 U/L (ref 39–117)
BUN: 18 mg/dL (ref 6–23)
CO2: 34 mEq/L — ABNORMAL HIGH (ref 19–32)
Calcium: 9.1 mg/dL (ref 8.4–10.5)
Chloride: 101 mEq/L (ref 96–112)
Creatinine, Ser: 0.93 mg/dL (ref 0.40–1.20)
GFR: 61.78 mL/min (ref >60.00)
Glucose, Bld: 114 mg/dL — ABNORMAL HIGH (ref 70–99)
Potassium: 4.1 mEq/L (ref 3.5–5.1)
Sodium: 140 mEq/L (ref 135–145)
Total Bilirubin: 0.5 mg/dL (ref 0.2–1.2)
Total Protein: 7.1 g/dL (ref 6.0–8.3)

## 2020-04-03 LAB — CBC WITH DIFFERENTIAL/PLATELET
Basophils Absolute: 0 10*3/uL (ref 0.0–0.1)
Basophils Relative: 0.4 % (ref 0.0–3.0)
Eosinophils Absolute: 0.2 10*3/uL (ref 0.0–0.7)
Eosinophils Relative: 3.2 % (ref 0.0–5.0)
HCT: 40 % (ref 36.0–46.0)
Hemoglobin: 13.5 g/dL (ref 12.0–15.0)
Lymphocytes Relative: 29.2 % (ref 12.0–46.0)
Lymphs Abs: 1.4 10*3/uL (ref 0.7–4.0)
MCHC: 33.8 g/dL (ref 30.0–36.0)
MCV: 85.2 fl (ref 78.0–100.0)
Monocytes Absolute: 0.3 10*3/uL (ref 0.1–1.0)
Monocytes Relative: 5.6 % (ref 3.0–12.0)
Neutro Abs: 2.9 10*3/uL (ref 1.4–7.7)
Neutrophils Relative %: 61.6 % (ref 43.0–77.0)
Platelets: 179 10*3/uL (ref 150.0–400.0)
RBC: 4.7 Mil/uL (ref 3.87–5.11)
RDW: 14 % (ref 11.5–15.5)
WBC: 4.7 10*3/uL (ref 4.0–10.5)

## 2020-04-03 LAB — CK: Total CK: 48 U/L (ref 7–177)

## 2020-04-03 LAB — URINALYSIS
Bilirubin Urine: NEGATIVE
Hgb urine dipstick: NEGATIVE
Ketones, ur: NEGATIVE
Leukocytes,Ua: NEGATIVE
Nitrite: NEGATIVE
Specific Gravity, Urine: 1.025 (ref 1.000–1.030)
Total Protein, Urine: NEGATIVE
Urine Glucose: NEGATIVE
Urobilinogen, UA: 0.2 (ref 0.0–1.0)
pH: 6 (ref 5.0–8.0)

## 2020-04-03 LAB — LIPID PANEL
Cholesterol: 155 mg/dL (ref 0–200)
HDL: 61.4 mg/dL (ref >39.00)
LDL Cholesterol: 73 mg/dL (ref 0–99)
NonHDL: 93.51
Total CHOL/HDL Ratio: 3
Triglycerides: 102 mg/dL (ref 0.0–149.0)
VLDL: 20.4 mg/dL (ref 0.0–40.0)

## 2020-04-03 LAB — VITAMIN D 25 HYDROXY (VIT D DEFICIENCY, FRACTURES): VITD: 58.72 ng/mL (ref 30.00–100.00)

## 2020-04-03 LAB — TSH: TSH: 1.44 u[IU]/mL (ref 0.35–4.50)

## 2020-04-03 LAB — VITAMIN B12: Vitamin B-12: 822 pg/mL (ref 211–911)

## 2020-04-03 NOTE — Progress Notes (Signed)
Subjective:  Patient ID: Bianca Haley, female    DOB: 02-06-1951  Age: 69 y.o. MRN: [id number]  CC: Annual Exam (Flu shot & Pneumoccal Poly) and Neck Pain   HPI Bianca Haley presents for chronic pain - CBD balm is helping; depression, anxiety f/u  Outpatient Medications Prior to Visit  Medication Sig Dispense Refill  . acetaminophen (TYLENOL) 650 MG CR tablet Take 1,300 mg by mouth every 8 (eight) hours as needed for pain.    [name] albuterol (VENTOLIN HFA) 108 (90 Base) MCG/ACT inhaler INHALE 2 PUFFS INTO THE LUNGS EVERY 4 HOURS AS NEEDED FOR WHEEZING OR SHORTNESS OF BREATH (Patient taking differently: Inhale 2 puffs into the lungs every 4 (four) hours as needed for wheezing or shortness of breath. ) 18 g 2  . atenolol (TENORMIN) 100 MG tablet TAKE 1 TABLET(100 MG) BY MOUTH EVERY DAY 90 tablet 2  . Carboxymethylcellulose Sodium (RETAINE CMC OP) Place 1 application into both eyes at bedtime.    . Cholecalciferol (VITAMIN D) 2000 units tablet Take 2,000 Units by mouth at bedtime.     . diazepam (VALIUM) 5 MG tablet TAKE 1 TABLET BY MOUTH DAILY AS NEEDED FOR ANXIETY OR MUSCLE SPASMS 30 tablet 0  . diltiazem (CARDIZEM CD) 180 MG 24 hr capsule TAKE 1 CAPSULE(180 MG) BY MOUTH DAILY Annual appt is due in must see provider for future refills 30 capsule 0  . fluticasone (FLONASE) 50 MCG/ACT nasal spray Place 2 sprays into both nostrils daily as needed for allergies or rhinitis.    . Multiple Vitamin (MULTIVITAMIN WITH MINERALS) TABS tablet Take 2 tablets by mouth daily.     [name] oxymorphone (OPANA) 10 MG tablet Take 10 mg by mouth 2 (two) times daily as needed for pain.     . pantoprazole (PROTONIX) 40 MG tablet TAKE 1 TABLET(40 MG) BY MOUTH TWICE DAILY BEFORE A MEAL 180 tablet 0  . PARoxetine (PAXIL) 40 MG tablet TAKE 1 TABLET(40 MG) BY MOUTH TWICE DAILY 180 tablet 0  . Polyethyl Glycol-Propyl Glycol (SYSTANE OP) Place 1 drop into both eyes daily as needed (dry eyes).    . polyethylene glycol  (MIRALAX / GLYCOLAX) packet Take 17 g by mouth 2 (two) times daily. (Patient taking differently: Take 17 g by mouth daily as needed (constipation). Mix in 8 oz liquid and drink) 14 each 0  . potassium chloride (KLOR-CON) 10 MEQ tablet TAKE 1 TABLET BY MOUTH DAILY 90 tablet 0  . Probiotic CAPS Take 1 capsule by mouth daily.    . rosuvastatin (CRESTOR) 5 MG tablet TAKE 1 TABLET(5 MG) BY MOUTH DAILY 90 tablet 3  . SUMAtriptan (IMITREX) 100 MG tablet TAKE 1 TABLET BY MOUTH DAILY AS NEEDED FOR MIGRAINE. MAY REPEAT IN 2 HOURS IF HEADACHE PERSISTS OR RECURS 12 tablet 0  . traZODone (DESYREL) 150 MG tablet TAKE 1 TABLET BY MOUTH AT BEDTIME AS NEEDED FOR SLEEP 30 tablet 0  . vitamin B-12 (CYANOCOBALAMIN) 500 MCG tablet Take 500 mcg by mouth daily.    [name] gabapentin (NEURONTIN) 300 MG capsule Take 1 capsule (300 mg total) by mouth 3 (three) times daily for 30 days. (Patient taking differently: Take 600-900 mg by mouth at bedtime. ) 90 capsule 0   No facility-administered medications prior to visit.    ROS: Review of Systems  Constitutional: Negative for activity change, appetite change, chills, fatigue and unexpected weight change.  HENT: Negative for congestion, mouth sores and sinus pressure.   Eyes: Negative for visual  disturbance.  Respiratory: Negative for cough and chest tightness.   Gastrointestinal: Negative for abdominal pain and nausea.  Genitourinary: Negative for difficulty urinating, frequency and vaginal pain.  Musculoskeletal: Positive for arthralgias, back pain and gait problem.  Skin: Negative for pallor and rash.  Neurological: Positive for weakness. Negative for dizziness, tremors, numbness and headaches.  Psychiatric/Behavioral: Negative for confusion and sleep disturbance.    Objective:  BP 134/80 (BP Location: Left Arm)   Pulse (!) 56   Temp 98.9 F (37.2 C) (Oral)   Ht 5' 5.5" (1.664 m)   Wt 150 lb 6.4 oz (68.2 kg)   SpO2 95%   BMI 24.65 kg/m   BP Readings from Last 3  Encounters:  04/03/20 134/80  10/30/19 138/80  06/29/19 134/86    Wt Readings from Last 3 Encounters:  04/03/20 150 lb 6.4 oz (68.2 kg)  10/30/19 158 lb 12.8 oz (72 kg)  06/29/19 155 lb (70.3 kg)    Physical Exam Constitutional:      General: She is not in acute distress.    Appearance: She is well-developed.  HENT:     Head: Normocephalic.     Right Ear: External ear normal.     Left Ear: External ear normal.     Nose: Nose normal.  Eyes:     General:        Right eye: No discharge.        Left eye: No discharge.     Conjunctiva/sclera: Conjunctivae normal.     Pupils: Pupils are equal, round, and reactive to light.  Neck:     Thyroid: No thyromegaly.     Vascular: No JVD.     Trachea: No tracheal deviation.  Cardiovascular:     Rate and Rhythm: Normal rate and regular rhythm.     Heart sounds: Normal heart sounds.  Pulmonary:     Effort: No respiratory distress.     Breath sounds: No stridor. No wheezing.  Abdominal:     General: Bowel sounds are normal. There is no distension.     Palpations: Abdomen is soft. There is no mass.     Tenderness: There is no abdominal tenderness. There is no guarding or rebound.  Musculoskeletal:        General: Tenderness present.     Cervical back: Normal range of motion and neck supple.  Lymphadenopathy:     Cervical: No cervical adenopathy.  Skin:    Findings: No erythema or rash.  Neurological:     Mental Status: She is oriented to person, place, and time.     Cranial Nerves: No cranial nerve deficit.     Motor: No abnormal muscle tone.     Coordination: Coordination normal.     Deep Tendon Reflexes: Reflexes normal.  Psychiatric:        Behavior: Behavior normal.        Thought Content: Thought content normal.        Judgment: Judgment normal.   LS tender  Lab Results  Component Value Date   WBC 5.1 01/30/2019   HGB 13.3 01/30/2019   HCT 40.1 01/30/2019   PLT 218.0 01/30/2019   GLUCOSE 79 01/30/2019   CHOL 262  (H) 09/26/2015   TRIG 71.0 09/26/2015   HDL 61.60 09/26/2015   LDLCALC 186 (H) 09/26/2015   ALT 11 01/30/2019   AST 20 01/30/2019   NA 144 01/30/2019   K 4.5 01/30/2019   CL 103 01/30/2019   CREATININE 0.99 01/30/2019  BUN 16 01/30/2019   CO2 32 01/30/2019   TSH 0.87 01/30/2019   INR 1.0 09/21/2018   HGBA1C 6.1 06/23/2018    No results found.  Assessment & Plan:     Follow-up: No follow-ups on file.  [name], MD

## 2020-04-03 NOTE — Patient Instructions (Signed)
   B-complex with Niacin 100 mg    Lion's mane  

## 2020-04-03 NOTE — Addendum Note (Signed)
Addended by: Cresenciano Lick on: 04/03/2020 12:02 PM   Modules accepted: Orders

## 2020-04-03 NOTE — Assessment & Plan Note (Signed)
On Crestor 

## 2020-04-03 NOTE — Assessment & Plan Note (Signed)
labs

## 2020-04-03 NOTE — Assessment & Plan Note (Signed)
BP Readings from Last 3 Encounters:  04/03/20 134/80  10/30/19 138/80  06/29/19 134/86

## 2020-04-05 ENCOUNTER — Other Ambulatory Visit: Payer: Self-pay | Admitting: Internal Medicine

## 2020-04-15 ENCOUNTER — Other Ambulatory Visit: Payer: Self-pay

## 2020-04-15 ENCOUNTER — Other Ambulatory Visit: Payer: Self-pay | Admitting: Internal Medicine

## 2020-04-15 ENCOUNTER — Ambulatory Visit (INDEPENDENT_AMBULATORY_CARE_PROVIDER_SITE_OTHER)
Admission: RE | Admit: 2020-04-15 | Discharge: 2020-04-15 | Disposition: A | Discharge status: Home / Self Care | Payer: Medicare Other | Source: Ambulatory Visit | Attending: Internal Medicine | Admitting: Internal Medicine

## 2020-04-15 DIAGNOSIS — N959 Unspecified menopausal and perimenopausal disorder: Secondary | ICD-10-CM

## 2020-04-23 ENCOUNTER — Other Ambulatory Visit: Payer: Self-pay | Admitting: Internal Medicine

## 2020-04-25 ENCOUNTER — Telehealth: Payer: Self-pay | Admitting: Internal Medicine

## 2020-04-25 MED ORDER — PAROXETINE HCL 40 MG PO TABS: ORAL_TABLET | ORAL | 3 refills | Status: DC

## 2020-04-25 NOTE — Telephone Encounter (Signed)
Patient has no medication remaining  1.Medication Requested: PARoxetine (PAXIL) 40 MG tablet  2. Pharmacy (Name, Street, City):[facility] [place], [place]  3. On Med List: yes  4. Last Visit with PCP: 04/03/20  5. Next visit date with PCP: 10/02/20   Agent: Please be advised that RX refills may take up to 3 business days. We ask that you follow-up with your pharmacy.

## 2020-04-25 NOTE — Telephone Encounter (Signed)
Reviewed chart pt is up-to-date sent refills to [facility].../lmb  

## 2020-04-29 NOTE — Telephone Encounter (Signed)
   Patient calling to request prior auth for PARoxetine (PAXIL) 40 MG tablet

## 2020-05-01 ENCOUNTER — Telehealth: Payer: Self-pay

## 2020-05-01 NOTE — Telephone Encounter (Signed)
New PA has been submitted

## 2020-05-02 NOTE — Telephone Encounter (Signed)
Okay.  That is another strength covered, i.e. 20 mg?  Thanks

## 2020-05-06 ENCOUNTER — Other Ambulatory Visit: Payer: Self-pay

## 2020-05-06 ENCOUNTER — Encounter: Payer: Self-pay | Admitting: Adult Health

## 2020-05-06 ENCOUNTER — Ambulatory Visit (INDEPENDENT_AMBULATORY_CARE_PROVIDER_SITE_OTHER): Payer: Medicare Other | Admitting: Adult Health

## 2020-05-06 DIAGNOSIS — R058 Other specified cough: Secondary | ICD-10-CM | POA: Diagnosis not present

## 2020-05-06 DIAGNOSIS — G4734 Idiopathic sleep related nonobstructive alveolar hypoventilation: Secondary | ICD-10-CM | POA: Diagnosis not present

## 2020-05-06 DIAGNOSIS — J189 Pneumonia, unspecified organism: Secondary | ICD-10-CM | POA: Diagnosis not present

## 2020-05-06 DIAGNOSIS — K219 Gastro-esophageal reflux disease without esophagitis: Secondary | ICD-10-CM | POA: Diagnosis not present

## 2020-05-06 NOTE — Assessment & Plan Note (Signed)
Doing well  Albuterol As needed

## 2020-05-06 NOTE — Assessment & Plan Note (Signed)
Continue on GERD diet /aspiration precautions

## 2020-05-06 NOTE — Patient Instructions (Signed)
Albuterol As needed   Activity as tolerated.  Continue on Oxygen 1l/m At bedtime   Follow up with Dr. Vassie Loll  In 6 months and As needed

## 2020-05-06 NOTE — Assessment & Plan Note (Signed)
Continue on O2 1l/m At bedtime

## 2020-05-06 NOTE — Assessment & Plan Note (Signed)
No recent bouts Continue preventive precautions

## 2020-05-06 NOTE — Progress Notes (Signed)
@Patient  ID: Bianca Haley, female    DOB: [birth year], 70 y.o.   MRN: [id number]  Chief Complaint  Patient presents with  . Follow-up    Referring provider: [name], MD  HPI: 70 year old female never smoker followed for recurrent pneumonia felt secondary to aspiration with known chronic narcotic use and esophageal dysmotility History of chronic cough since 2014 Nocturnal hypoxemia on oxygen 1 L. Chronic back /neck pain on narcotics   TEST/EVENTS :  ONO 11/2019 ONO showed desatn < 88% through the night Start 1 L O2 during sleep  NPSG 01/2019 TST 328 minutes , weight 157 pounds, AHI 1.5/hour. Oxygen saturation less than 89% for 84% of the time, corrected by 1 L oxygen  CT angio 07/2018 Patchy areas of very subtle ground-glass attenuation are noted throughout the lungs bilaterally   02/2013 CT chest showed RML/RLL pneumonia and what appeared to be some progression of bronchiectasis (first noted 2011)   Esophagram was negative. IgG,IgaA and IgM levels were low, but not immunosuppressed.   09/2013 admitted for recurrent PNA /aspiration pneumonitis . CXR showed R>L consolidation c/w multifocal PNA.  MBS>>dysphagia but raised concerns of possible primary esophageal dysmotility as barium pill stopped mid esophagus requiring additional thin barium to facilitate transit to stomach , PPI to BID prior to discharge.    04/2014 EGD showed mild esophagitis.  2015 BL severe pneumonia when visiting son in [place]  05/06/2020 Follow up : Recurrent PNA , Chronic cough , Chronic respiratory failure on nocturnal O2 at 1l/m .  Returns for 1 year follow up, doing well . No flare of cough or congestion.  Very safe, fully vaccinated with covid vaccine x 3. No recent antibiotics. Rare Albuterol use.  Remains active, walks on regular basis . Some limitations for prolonged walking due to knee pain /stiffness.  Recently downsized to retirement center. Lives in [place]  living -apartment. Really likes it.   Overnight oximetry earlier this year showed hypoxia. Now on O2 at 1l/m At bedtime  . Doing well with this.    Allergies  Allergen Reactions  . Penicillins Shortness Of Breath and Rash    Did it involve swelling of the face/tongue/throat, SOB, or low BP? No Did it involve sudden or severe rash/hives, skin peeling, or any reaction on the inside of your mouth or nose? Yes Did you need to seek medical attention at a hospital or doctor's office? No When did it last happen?10 + years If all above answers are "NO", may proceed with cephalosporin use.    [name] Morphine And Related Swelling    Sedation/Swelling described like edema/bloating/doesn't help the pain Morphine only; tolerates other opioids.   . Cefepime Rash    Reaction while taking both cefepime and vancomycin at [facility] (after 3 days)  . Vancomycin Rash    Reaction while taking both cefepime and vancomycin at [facility] (after 3 days)    Immunization History  Administered Date(s) Administered  . Fluad Quad(high Dose 65+) 01/30/2019, 04/03/2020  . Influenza Split 02/27/2015  . Influenza Whole 01/30/2009  . Influenza, High Dose Seasonal PF 02/08/2017, 01/04/2018  . Influenza, Seasonal, Injecte, Preservative Fre 02/27/2015  . Influenza,inj,Quad PF,6+ Mos 01/26/2013, 01/26/2014, 02/21/2016  . Influenza-Unspecified 01/26/2013, 01/26/2014, 02/21/2016, 02/09/2017  . Moderna SARS-COV2 Booster Vaccination 04/05/2020  . Moderna Sars-Covid-2 Vaccination 05/25/2019, 06/22/2019  . Pneumococcal Conjugate-13 01/26/2013  . Pneumococcal Polysaccharide-23 03/23/2013, 04/03/2020  . Td 01/30/2009  . Tdap 06/23/2018  . Zoster Recombinat (Shingrix) 02/07/2018    Past Medical History:  Diagnosis Date  . Anemia    history of anemia  . Anxiety   . Arthritis   . Chronic low back pain   . Chronic renal failure    patient denies   . Diverticulosis   . Dupuytren contracture    Right hand, has received  injection  . GERD (gastroesophageal reflux disease)   . H/O tinnitus    left  . Hearing loss    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  . Hemorrhoids   . History of colon polyps   . History of endometriosis   . History of hiatal hernia   . History of uterine fibroid   . History of UTI   . Hypertension   . Memory loss   . Metabolic encephalopathy    History of: resolved  . Migraine   . Recurrent pneumonia 07/2018  . Seasonal allergies   . Vitamin D deficiency   . Wears glasses     Tobacco History: Social History   Tobacco Use  Smoking Status Never Smoker  Smokeless Tobacco Never Used   Counseling given: Not Answered   Outpatient Medications Prior to Visit  Medication Sig Dispense Refill  . acetaminophen (TYLENOL) 650 MG CR tablet Take 1,300 mg by mouth every 8 (eight) hours as needed for pain.    [name] albuterol (VENTOLIN HFA) 108 (90 Base) MCG/ACT inhaler INHALE 2 PUFFS INTO THE LUNGS EVERY 4 HOURS AS NEEDED FOR WHEEZING OR SHORTNESS OF BREATH (Patient taking differently: Inhale 2 puffs into the lungs every 4 (four) hours as needed for wheezing or shortness of breath.) 18 g 2  . atenolol (TENORMIN) 100 MG tablet TAKE 1 TABLET(100 MG) BY MOUTH EVERY DAY 90 tablet 3  . Carboxymethylcellulose Sodium (RETAINE CMC OP) Place 1 application into both eyes at bedtime.    . Cholecalciferol (VITAMIN D) 2000 units tablet Take 2,000 Units by mouth at bedtime.     . diazepam (VALIUM) 5 MG tablet TAKE 1 TABLET BY MOUTH DAILY AS NEEDED FOR ANXIETY OR MUSCLE SPASMS 30 tablet 0  . diltiazem (CARDIZEM CD) 180 MG 24 hr capsule TAKE 1 CAPSULE(180 MG) BY MOUTH DAILY 90 capsule 3  . fluticasone (FLONASE) 50 MCG/ACT nasal spray Place 2 sprays into both nostrils daily as needed for allergies or rhinitis.    . Multiple Vitamin (MULTIVITAMIN WITH MINERALS) TABS tablet Take 2 tablets by mouth daily.     [name] oxymorphone (OPANA) 10 MG tablet Take 10 mg by mouth 2 (two) times daily as needed for pain.     . pantoprazole  (PROTONIX) 40 MG tablet TAKE 1 TABLET(40 MG) BY MOUTH TWICE DAILY BEFORE A MEAL 180 tablet 3  . PARoxetine (PAXIL) 40 MG tablet TAKE 1 TABLET(40 MG) BY MOUTH TWICE DAILY 180 tablet 3  . Polyethyl Glycol-Propyl Glycol (SYSTANE OP) Place 1 drop into both eyes daily as needed (dry eyes).    . polyethylene glycol (MIRALAX / GLYCOLAX) packet Take 17 g by mouth 2 (two) times daily. (Patient taking differently: Take 17 g by mouth daily as needed (constipation). Mix in 8 oz liquid and drink) 14 each 0  . potassium chloride (KLOR-CON) 10 MEQ tablet TAKE 1 TABLET BY MOUTH DAILY 90 tablet 0  . Probiotic CAPS Take 1 capsule by mouth daily.    . rosuvastatin (CRESTOR) 5 MG tablet TAKE 1 TABLET(5 MG) BY MOUTH DAILY 90 tablet 3  . SUMAtriptan (IMITREX) 100 MG tablet TAKE 1 TABLET BY MOUTH DAILY AS NEEDED FOR MIGRAINE. MAY REPEAT IN  2 HOURS IF HEADACHE PERSISTS OR RECURS 12 tablet 0  . traZODone (DESYREL) 150 MG tablet TAKE 1 TABLET BY MOUTH AT BEDTIME AS NEEDED FOR SLEEP 30 tablet 0  . vitamin B-12 (CYANOCOBALAMIN) 500 MCG tablet Take 500 mcg by mouth daily.    [name] gabapentin (NEURONTIN) 300 MG capsule Take 1 capsule (300 mg total) by mouth 3 (three) times daily for 30 days. (Patient taking differently: Take 600-900 mg by mouth at bedtime. ) 90 capsule 0   No facility-administered medications prior to visit.     Review of Systems:   Constitutional:   No  weight loss, night sweats,  Fevers, chills, fatigue, or  lassitude.  HEENT:   No headaches,  Difficulty swallowing,  Tooth/dental problems, or  Sore throat,                No sneezing, itching, ear ache, nasal congestion, post nasal drip,   CV:  No chest pain,  Orthopnea, PND, swelling in lower extremities, anasarca, dizziness, palpitations, syncope.   GI  No heartburn, indigestion, abdominal pain, nausea, vomiting, diarrhea, change in bowel habits, loss of appetite, bloody stools.   Resp:  No excess mucus, no productive cough,  No non-productive cough,   No coughing up of blood.  No change in color of mucus.  No wheezing.  No chest wall deformity  Skin: no rash or lesions.  GU: no dysuria, change in color of urine, no urgency or frequency.  No flank pain, no hematuria   MS:  No joint pain or swelling.  No decreased range of motion.+knee pain     Physical Exam  BP 120/66 (BP Location: Left Arm, Patient Position: Sitting, Cuff Size: Normal)   Pulse 60   Temp (!) 97.4 F (36.3 C) (Temporal)   Ht 5' 5.5" (1.664 m)   Wt 158 lb 9.6 oz (71.9 kg)   SpO2 91%   BMI 25.99 kg/m   GEN: A/Ox3; pleasant , NAD, well nourished    HEENT:  [place]/AT,  EACs-clear, TMs-wnl, NOSE-clear, THROAT-clear, no lesions, no postnasal drip or exudate noted.   NECK:  Supple w/ fair ROM; no JVD; normal carotid impulses w/o bruits; no thyromegaly or nodules palpated; no lymphadenopathy.    RESP  Clear  P & A; w/o, wheezes/ rales/ or rhonchi. no accessory muscle use, no dullness to percussion  CARD:  RRR, no m/r/g, no peripheral edema, pulses intact, no cyanosis or clubbing.  GI:   Soft & nt; nml bowel sounds; no organomegaly or masses detected.   Musco: Warm bil, no deformities or joint swelling noted.   Neuro: alert, no focal deficits noted.    Skin: Warm, no lesions or rashes       ProBNP No results found for: PROBNP  Imaging:     No flowsheet data found.  No results found for: NITRICOXIDE      Assessment & Plan:   Nocturnal hypoxia Continue on O2 1l/m At bedtime     GERD (gastroesophageal reflux disease) Continue on GERD diet /aspiration precautions   Recurrent pneumonia No recent bouts Continue preventive precautions   Upper airway cough syndrome Doing well  Albuterol As needed        Rubye Oaks, NP 05/06/2020

## 2020-05-09 ENCOUNTER — Telehealth: Payer: Self-pay | Admitting: Adult Health

## 2020-05-09 DIAGNOSIS — J189 Pneumonia, unspecified organism: Secondary | ICD-10-CM

## 2020-05-09 MED ORDER — ALBUTEROL SULFATE HFA 108 (90 BASE) MCG/ACT IN AERS: INHALATION_SPRAY | RESPIRATORY_TRACT | 2 refills | Status: DC

## 2020-05-09 NOTE — Telephone Encounter (Signed)
Spoke with the pt   She states needing refill on her albuterol inhaler  Rx sent to pharm  Nothing further needed

## 2020-05-14 ENCOUNTER — Telehealth: Payer: Self-pay | Admitting: Internal Medicine

## 2020-05-14 NOTE — Telephone Encounter (Signed)
    Patient wants to discuss vitamins she should be taking She recently took a multivitamin that caused an allergic reaction She wants to know what specific vitamin her body needs so she can purchase that particular vitamin, not a multivitamin

## 2020-05-19 NOTE — Telephone Encounter (Signed)
Continue with vitamin D 2000 units daily.  Take calcium 500 mg twice a day.  Thanks

## 2020-05-30 ENCOUNTER — Other Ambulatory Visit: Payer: Self-pay | Admitting: Internal Medicine

## 2020-06-03 DIAGNOSIS — M5412 Radiculopathy, cervical region: Secondary | ICD-10-CM | POA: Diagnosis not present

## 2020-06-04 DIAGNOSIS — M5412 Radiculopathy, cervical region: Secondary | ICD-10-CM | POA: Diagnosis not present

## 2020-06-04 DIAGNOSIS — Z981 Arthrodesis status: Secondary | ICD-10-CM | POA: Diagnosis not present

## 2020-06-04 DIAGNOSIS — M542 Cervicalgia: Secondary | ICD-10-CM | POA: Diagnosis not present

## 2020-06-12 ENCOUNTER — Other Ambulatory Visit: Payer: Self-pay | Admitting: Internal Medicine

## 2020-06-24 ENCOUNTER — Ambulatory Visit: Payer: Medicare Other | Admitting: Neurology

## 2020-07-01 ENCOUNTER — Ambulatory Visit: Payer: Medicare Other | Admitting: Neurology

## 2020-07-08 DIAGNOSIS — Z1159 Encounter for screening for other viral diseases: Secondary | ICD-10-CM | POA: Diagnosis not present

## 2020-07-08 DIAGNOSIS — Z20828 Contact with and (suspected) exposure to other viral communicable diseases: Secondary | ICD-10-CM | POA: Diagnosis not present

## 2020-07-22 DIAGNOSIS — Z20828 Contact with and (suspected) exposure to other viral communicable diseases: Secondary | ICD-10-CM | POA: Diagnosis not present

## 2020-07-22 DIAGNOSIS — Z1159 Encounter for screening for other viral diseases: Secondary | ICD-10-CM | POA: Diagnosis not present

## 2020-07-25 ENCOUNTER — Inpatient Hospital Stay (HOSPITAL_COMMUNITY)
Admission: EM | Admit: 2020-07-25 | Discharge: 2020-07-27 | DRG: 092 | Disposition: A | Discharge status: Home Health | Payer: Medicare Other | Attending: Internal Medicine | Admitting: Internal Medicine

## 2020-07-25 ENCOUNTER — Other Ambulatory Visit: Payer: Self-pay

## 2020-07-25 ENCOUNTER — Emergency Department (HOSPITAL_COMMUNITY): Payer: Medicare Other

## 2020-07-25 ENCOUNTER — Encounter (HOSPITAL_COMMUNITY): Payer: Self-pay | Admitting: Emergency Medicine

## 2020-07-25 DIAGNOSIS — I10 Essential (primary) hypertension: Secondary | ICD-10-CM | POA: Diagnosis present

## 2020-07-25 DIAGNOSIS — Y92009 Unspecified place in unspecified non-institutional (private) residence as the place of occurrence of the external cause: Secondary | ICD-10-CM

## 2020-07-25 DIAGNOSIS — G894 Chronic pain syndrome: Secondary | ICD-10-CM | POA: Diagnosis present

## 2020-07-25 DIAGNOSIS — F419 Anxiety disorder, unspecified: Secondary | ICD-10-CM | POA: Diagnosis present

## 2020-07-25 DIAGNOSIS — Z743 Need for continuous supervision: Secondary | ICD-10-CM | POA: Diagnosis not present

## 2020-07-25 DIAGNOSIS — E860 Dehydration: Secondary | ICD-10-CM | POA: Diagnosis present

## 2020-07-25 DIAGNOSIS — Z8249 Family history of ischemic heart disease and other diseases of the circulatory system: Secondary | ICD-10-CM

## 2020-07-25 DIAGNOSIS — Z20822 Contact with and (suspected) exposure to covid-19: Secondary | ICD-10-CM | POA: Diagnosis present

## 2020-07-25 DIAGNOSIS — I739 Peripheral vascular disease, unspecified: Secondary | ICD-10-CM | POA: Diagnosis not present

## 2020-07-25 DIAGNOSIS — Z79899 Other long term (current) drug therapy: Secondary | ICD-10-CM

## 2020-07-25 DIAGNOSIS — N179 Acute kidney failure, unspecified: Secondary | ICD-10-CM | POA: Diagnosis present

## 2020-07-25 DIAGNOSIS — Z90710 Acquired absence of both cervix and uterus: Secondary | ICD-10-CM

## 2020-07-25 DIAGNOSIS — T40695A Adverse effect of other narcotics, initial encounter: Secondary | ICD-10-CM | POA: Diagnosis not present

## 2020-07-25 DIAGNOSIS — E876 Hypokalemia: Secondary | ICD-10-CM

## 2020-07-25 DIAGNOSIS — Z888 Allergy status to other drugs, medicaments and biological substances status: Secondary | ICD-10-CM

## 2020-07-25 DIAGNOSIS — E785 Hyperlipidemia, unspecified: Secondary | ICD-10-CM | POA: Diagnosis present

## 2020-07-25 DIAGNOSIS — R29810 Facial weakness: Secondary | ICD-10-CM | POA: Diagnosis not present

## 2020-07-25 DIAGNOSIS — R410 Disorientation, unspecified: Secondary | ICD-10-CM | POA: Diagnosis not present

## 2020-07-25 DIAGNOSIS — J3489 Other specified disorders of nose and nasal sinuses: Secondary | ICD-10-CM | POA: Diagnosis not present

## 2020-07-25 DIAGNOSIS — R402411 Glasgow coma scale score 13-15, in the field [EMT or ambulance]: Secondary | ICD-10-CM

## 2020-07-25 DIAGNOSIS — Z885 Allergy status to narcotic agent status: Secondary | ICD-10-CM

## 2020-07-25 DIAGNOSIS — G928 Other toxic encephalopathy: Secondary | ICD-10-CM | POA: Diagnosis not present

## 2020-07-25 DIAGNOSIS — J322 Chronic ethmoidal sinusitis: Secondary | ICD-10-CM | POA: Diagnosis not present

## 2020-07-25 DIAGNOSIS — G43909 Migraine, unspecified, not intractable, without status migrainosus: Secondary | ICD-10-CM | POA: Diagnosis present

## 2020-07-25 DIAGNOSIS — Z96652 Presence of left artificial knee joint: Secondary | ICD-10-CM | POA: Diagnosis present

## 2020-07-25 DIAGNOSIS — M545 Low back pain, unspecified: Secondary | ICD-10-CM | POA: Diagnosis present

## 2020-07-25 DIAGNOSIS — R4182 Altered mental status, unspecified: Secondary | ICD-10-CM | POA: Diagnosis not present

## 2020-07-25 DIAGNOSIS — T424X5A Adverse effect of benzodiazepines, initial encounter: Secondary | ICD-10-CM | POA: Diagnosis present

## 2020-07-25 DIAGNOSIS — G9340 Encephalopathy, unspecified: Secondary | ICD-10-CM | POA: Diagnosis present

## 2020-07-25 DIAGNOSIS — K219 Gastro-esophageal reflux disease without esophagitis: Secondary | ICD-10-CM | POA: Diagnosis present

## 2020-07-25 DIAGNOSIS — Z825 Family history of asthma and other chronic lower respiratory diseases: Secondary | ICD-10-CM

## 2020-07-25 DIAGNOSIS — Z880 Allergy status to penicillin: Secondary | ICD-10-CM

## 2020-07-25 DIAGNOSIS — F32A Depression, unspecified: Secondary | ICD-10-CM | POA: Diagnosis present

## 2020-07-25 DIAGNOSIS — E559 Vitamin D deficiency, unspecified: Secondary | ICD-10-CM | POA: Diagnosis present

## 2020-07-25 DIAGNOSIS — Z87440 Personal history of urinary (tract) infections: Secondary | ICD-10-CM

## 2020-07-25 DIAGNOSIS — R404 Transient alteration of awareness: Secondary | ICD-10-CM | POA: Diagnosis not present

## 2020-07-25 DIAGNOSIS — H9190 Unspecified hearing loss, unspecified ear: Secondary | ICD-10-CM | POA: Diagnosis present

## 2020-07-25 DIAGNOSIS — R0902 Hypoxemia: Secondary | ICD-10-CM | POA: Diagnosis not present

## 2020-07-25 LAB — URINALYSIS, ROUTINE W REFLEX MICROSCOPIC
Bacteria, UA: NONE SEEN
Bilirubin Urine: NEGATIVE
Glucose, UA: NEGATIVE mg/dL
Hgb urine dipstick: NEGATIVE
Ketones, ur: 20 mg/dL — AB
Leukocytes,Ua: NEGATIVE
Nitrite: NEGATIVE
Protein, ur: 100 mg/dL — AB
Specific Gravity, Urine: 1.023 (ref 1.005–1.030)
pH: 5 (ref 5.0–8.0)

## 2020-07-25 LAB — COMPREHENSIVE METABOLIC PANEL
ALT: 14 U/L (ref 0–44)
AST: 23 U/L (ref 15–41)
Albumin: 3.7 g/dL (ref 3.5–5.0)
Alkaline Phosphatase: 93 U/L (ref 38–126)
Anion gap: 11 (ref 5–15)
BUN: 20 mg/dL (ref 8–23)
CO2: 30 mmol/L (ref 22–32)
Calcium: 9.8 mg/dL (ref 8.9–10.3)
Chloride: 98 mmol/L (ref 98–111)
Creatinine, Ser: 1.5 mg/dL — ABNORMAL HIGH (ref 0.44–1.00)
GFR, Estimated: 37 mL/min — ABNORMAL LOW (ref >60)
Glucose, Bld: 89 mg/dL (ref 70–99)
Potassium: 3.3 mmol/L — ABNORMAL LOW (ref 3.5–5.1)
Sodium: 139 mmol/L (ref 135–145)
Total Bilirubin: 0.7 mg/dL (ref 0.3–1.2)
Total Protein: 8 g/dL (ref 6.5–8.1)

## 2020-07-25 LAB — CBC WITH DIFFERENTIAL/PLATELET
Abs Immature Granulocytes: 0.05 10*3/uL (ref 0.00–0.07)
Basophils Absolute: 0 10*3/uL (ref 0.0–0.1)
Basophils Relative: 1 %
Eosinophils Absolute: 0.2 10*3/uL (ref 0.0–0.5)
Eosinophils Relative: 2 %
HCT: 44.2 % (ref 36.0–46.0)
Hemoglobin: 14.5 g/dL (ref 12.0–15.0)
Immature Granulocytes: 1 %
Lymphocytes Relative: 17 %
Lymphs Abs: 1.1 10*3/uL (ref 0.7–4.0)
MCH: 29 pg (ref 26.0–34.0)
MCHC: 32.8 g/dL (ref 30.0–36.0)
MCV: 88.4 fL (ref 80.0–100.0)
Monocytes Absolute: 0.6 10*3/uL (ref 0.1–1.0)
Monocytes Relative: 9 %
Neutro Abs: 4.7 10*3/uL (ref 1.7–7.7)
Neutrophils Relative %: 70 %
Platelets: 278 10*3/uL (ref 150–400)
RBC: 5 MIL/uL (ref 3.87–5.11)
RDW: 13.1 % (ref 11.5–15.5)
WBC: 6.7 10*3/uL (ref 4.0–10.5)
nRBC: 0 % (ref 0.0–0.2)

## 2020-07-25 LAB — RESP PANEL BY RT-PCR (FLU A&B, COVID) ARPGX2
Influenza A by PCR: NEGATIVE
Influenza B by PCR: NEGATIVE
SARS Coronavirus 2 by RT PCR: NEGATIVE

## 2020-07-25 LAB — I-STAT VENOUS BLOOD GAS, ED
Acid-Base Excess: 5 mmol/L — ABNORMAL HIGH (ref 0.0–2.0)
Bicarbonate: 32.9 mmol/L — ABNORMAL HIGH (ref 20.0–28.0)
Calcium, Ion: 1.24 mmol/L (ref 1.15–1.40)
HCT: 40 % (ref 36.0–46.0)
Hemoglobin: 13.6 g/dL (ref 12.0–15.0)
O2 Saturation: 34 %
Potassium: 3.4 mmol/L — ABNORMAL LOW (ref 3.5–5.1)
Sodium: 141 mmol/L (ref 135–145)
TCO2: 35 mmol/L — ABNORMAL HIGH (ref 22–32)
pCO2, Ven: 63.7 mmHg — ABNORMAL HIGH (ref 44.0–60.0)
pH, Ven: 7.321 (ref 7.250–7.430)
pO2, Ven: 23 mmHg — CL (ref 32.0–45.0)

## 2020-07-25 LAB — TSH: TSH: 2.738 u[IU]/mL (ref 0.350–4.500)

## 2020-07-25 LAB — MAGNESIUM: Magnesium: 2 mg/dL (ref 1.7–2.4)

## 2020-07-25 MED ORDER — LACTATED RINGERS IV BOLUS
1000.0000 mL | Freq: Once | INTRAVENOUS | Status: AC
  Administered 2020-07-25: 1000 mL via INTRAVENOUS

## 2020-07-25 NOTE — ED Notes (Signed)
Pt removed L AC PIV. Area dressed, catheter intact.

## 2020-07-25 NOTE — ED Notes (Signed)
Lysbeth Galas, daughter, [phone] would like an update when available

## 2020-07-25 NOTE — ED Triage Notes (Signed)
Patient BIB [facility] from [place]. Complaint of confusion x5 days worse last night. Patient A&Ox2, normally A&Ox4. Also endorses painful urination. Hx of frequent UTI.

## 2020-07-25 NOTE — ED Provider Notes (Signed)
Bianca Haley   CSN: [id number] Arrival date & time: 07/25/20  1721     History Chief Complaint  Patient presents with  . Altered Mental Status    Bianca Haley is a 70 y.o. female.  Patient is a 70 year old female with a history of chronic pain, memory loss, frequent UTIs, recurrent pneumonia, depression anxiety who presents with altered mental status.  Patient states that she has been feeling unwell for the past few days.  She appears awake and alert, however is not sure why she is in the hospital.  Further history had to be obtained from patient's husband who lives with her.  He states that he has been acting strange for the past 4 days.  He reports that starting on Sunday patient wanted to stay in bed all day and not get up.  She was extremely tired and fatigued.  Monday night she stopped eating.  He states he has not seen her eat a full meal since Tuesday.  He states that today she was at her worst mental status and was tachypneic.  He called EMS to bring her to the hospital as she typically gets this way when she has urinary tract infection or pneumonia.  He denies any trauma or falls.    Past Medical History:  Diagnosis Date  . Anemia    history of anemia  . Anxiety   . Arthritis   . Chronic low back pain   . Chronic renal failure    patient denies   . Diverticulosis   . Dupuytren contracture    Right hand, has received injection  . GERD (gastroesophageal reflux disease)   . H/O tinnitus    left  . Hearing loss    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  . Hemorrhoids   . History of colon polyps   . History of endometriosis   . History of hiatal hernia   . History of uterine fibroid   . History of UTI   . Hypertension   . Memory loss   . Metabolic encephalopathy    History of: resolved  . Migraine   . Recurrent pneumonia 07/2018  . Seasonal allergies   . Vitamin D deficiency   . Wears glasses     Patient Active  Problem List   Diagnosis Date Noted  . Acute encephalopathy 07/25/2020  . DOE (dyspnea on exertion) 01/30/2019  . Sleep disorder 01/30/2019  . Dilated bile duct   . Pancreatic duct dilated   . Dilation of biliary tract   . Hyperglycemia 06/23/2018  . Upper airway cough syndrome 06/12/2017  . Memory loss 03/12/2017  . Left foot infection 02/08/2017  . Deep incisional surgical site infection 11/09/2016  . Allergic reaction caused by a drug 11/06/2016  . Fever 09/16/2016  . Body aches 09/16/2016  . Cough 09/16/2016  . Edema 09/15/2016  . Urinary tract infection 08/14/2016  . Cerumen impaction 06/11/2016  . Posterior tibial tendon dysfunction (PTTD) of left lower extremity 04/13/2016  . Dyslipidemia 09/25/2015  . S/P left TKA 07/15/2015  . S/P knee replacement 07/15/2015  . Esophageal dysphagia 09/18/2014  . GERD (gastroesophageal reflux disease) 09/04/2014  . Chronic narcotic dependence ([place]) 09/04/2014  . Gross hematuria 08/31/2014  . Hemorrhagic cystitis 08/24/2014  . Bilateral lower extremity edema 04/23/2014  . Osteoarthritis of left knee 10/25/2013  . Knee pain, left 10/13/2013  . Left leg swelling 10/13/2013  . Chronic neck and back pain 09/28/2013  . Hypokalemia  09/28/2013  . Abnormal CT scan, head 09/27/2013  . Nocturnal hypoxia 09/27/2013  . Diastolic dysfunction- grade I 02/24/2013  . Recurrent pneumonia 02/21/2013  . Chronic cough 01/04/2013  . Contracture of palmar fascia (Dupuytren's) 08/04/2011  . VENOUS INSUFFICIENCY, LEFT LEG 05/14/2010  . CRF (chronic renal failure) 12/02/2009  . INGUINAL PAIN, RIGHT 12/02/2009  . UNSPECIFIED ANEMIA 11/28/2009  . PULMONARY NODULE 05/10/2008  . FIBROIDS, UTERUS 08/03/2007  . Anxiety state 08/03/2007  . MIGRAINE HEADACHE 08/03/2007  . TINNITUS, LEFT 08/03/2007  . Essential hypertension 08/03/2007  . ENDOMETRIOSIS 08/03/2007  . ALCOHOL ABUSE, HX OF 08/03/2007  . MEASLES, HX OF 08/03/2007  . Personal history of urinary  disorder 08/03/2007    Past Surgical History:  Procedure Laterality Date  . ABDOMINAL HYSTERECTOMY    . BACK SURGERY    . BIOPSY  10/20/2018   Procedure: BIOPSY;  Surgeon: [name], MD;  Location: [facility];  Service: Endoscopy;;  . BLADDER SUSPENSION    . COLONOSCOPY  02/16/2008   normal  . DENTAL SURGERY    . ESOPHAGOGASTRODUODENOSCOPY (EGD) WITH PROPOFOL N/A 10/20/2018   Procedure: ESOPHAGOGASTRODUODENOSCOPY (EGD) WITH PROPOFOL;  Surgeon: [name], MD;  Location: [facility];  Service: Endoscopy;  Laterality: N/A;  . EUS N/A 10/20/2018   Procedure: UPPER ENDOSCOPIC ULTRASOUND (EUS) RADIAL;  Surgeon: [name], MD;  Location: [facility];  Service: Endoscopy;  Laterality: N/A;  . FOOT SURGERY Left    x3  . laparoscopy     for evaluation of endometriosis  . TOTAL KNEE ARTHROPLASTY Left 07/15/2015   Procedure: TOTAL LEFT KNEE ARTHROPLASTY;  Surgeon: Paralee Cancel, MD;  Location: [facility];  Service: Orthopedics;  Laterality: Left;  . UPPER GI ENDOSCOPY       OB History    Gravida  2   Para  2   Term  2   Preterm      AB  0   Living  2     SAB  0   IAB  0   Ectopic  0   Multiple  0   Live Births              Family History  Problem Relation Age of Onset  . Emphysema Mother   . Heart disease Mother 29       MI    Social History   Tobacco Use  . Smoking status: Never Smoker  . Smokeless tobacco: Never Used  Vaping Use  . Vaping Use: Never used  Substance Use Topics  . Alcohol use: No    Alcohol/week: 0.0 standard drinks    Comment: As of 07/2018, it is been several years since she drank any alcohol.  . Drug use: No    Home Medications Prior to Admission medications   Medication Sig Start Date End Date Taking? Authorizing Provider  acetaminophen (TYLENOL) 650 MG CR tablet Take 1,300 mg by mouth every 8 (eight) hours as needed for pain.    [provider]  albuterol (VENTOLIN HFA) 108 (90 Base) MCG/ACT inhaler  INHALE 2 PUFFS INTO THE LUNGS EVERY 4 HOURS AS NEEDED FOR WHEEZING OR SHORTNESS OF BREATH 05/09/20   Parrett, Tammy S, NP  atenolol (TENORMIN) 100 MG tablet TAKE 1 TABLET(100 MG) BY MOUTH EVERY DAY 04/23/20   Plotnikov, [name], MD  Carboxymethylcellulose Sodium (RETAINE [facility] OP) Place 1 application into both eyes at bedtime.    [provider]  Cholecalciferol (VITAMIN D) 2000 units tablet Take 2,000  Units by mouth at bedtime.     [provider]  diazepam (VALIUM) 5 MG tablet TAKE 1 TABLET BY MOUTH DAILY AS NEEDED FOR ANXIETY OR MUSCLE SPASMS 03/27/20   Plotnikov, [name], MD  diltiazem (CARDIZEM CD) 180 MG 24 hr capsule TAKE 1 CAPSULE(180 MG) BY MOUTH DAILY 04/15/20   Plotnikov, [name], MD  fluticasone (FLONASE) 50 MCG/ACT nasal spray Place 2 sprays into both nostrils daily as needed for allergies or rhinitis.    [provider]  gabapentin (NEURONTIN) 300 MG capsule Take 1 capsule (300 mg total) by mouth 3 (three) times daily for 30 days. Patient taking differently: Take 600-900 mg by mouth at bedtime.  07/13/18 10/17/18  Arrien, Jimmy Picket, MD  Multiple Vitamin (MULTIVITAMIN WITH MINERALS) TABS tablet Take 2 tablets by mouth daily.     [provider]  oxymorphone (OPANA) 10 MG tablet Take 10 mg by mouth 2 (two) times daily as needed for pain.  09/29/13   Samella Parr, NP  pantoprazole (PROTONIX) 40 MG tablet TAKE 1 TABLET(40 MG) BY MOUTH TWICE DAILY BEFORE A MEAL 04/23/20   Plotnikov, [name], MD  PARoxetine (PAXIL) 40 MG tablet TAKE 1 TABLET(40 MG) BY MOUTH TWICE DAILY 04/25/20   Plotnikov, [name], MD  Polyethyl Glycol-Propyl Glycol (SYSTANE OP) Place 1 drop into both eyes daily as needed (dry eyes).    [provider]  polyethylene glycol (MIRALAX / GLYCOLAX) packet Take 17 g by mouth 2 (two) times daily. Patient taking differently: Take 17 g by mouth daily as needed (constipation). Mix in 8 oz liquid and drink 07/16/15   Danae Orleans, PA-C  potassium chloride (KLOR-CON) 10 MEQ tablet TAKE 1 TABLET(10 MEQ) BY MOUTH DAILY 06/12/20   Plotnikov, [name], MD  Probiotic CAPS Take 1 capsule by mouth daily.    [provider]  rosuvastatin (CRESTOR) 5 MG tablet TAKE 1 TABLET(5 MG) BY MOUTH DAILY 05/30/20   Plotnikov, [name], MD  SUMAtriptan (IMITREX) 100 MG tablet TAKE 1 TABLET BY MOUTH DAILY AS NEEDED FOR MIGRAINE. MAY REPEAT IN 2 HOURS IF HEADACHE PERSISTS OR RECURS 04/05/20   Plotnikov, [name], MD  traZODone (DESYREL) 150 MG tablet TAKE 1 TABLET BY MOUTH AT BEDTIME AS NEEDED FOR SLEEP 05/30/20   Plotnikov, [name], MD  vitamin B-12 (CYANOCOBALAMIN) 500 MCG tablet Take 500 mcg by mouth daily.    [provider]    Allergies    Penicillins, Morphine and related, Cefepime, and Vancomycin  Review of Systems   Review of Systems  Constitutional: Negative for chills and fever.  HENT: Negative for ear pain and sore throat.   Eyes: Negative for pain and visual disturbance.  Respiratory: Positive for cough and shortness of breath.   Cardiovascular: Negative for chest pain and palpitations.  Gastrointestinal: Negative for abdominal pain and vomiting.  Genitourinary: Negative for dysuria and hematuria.  Musculoskeletal: Negative for arthralgias and back pain.  Skin: Negative for color change and rash.  Neurological: Negative for seizures and syncope.  All other systems reviewed and are negative.   Physical Exam Updated Vital Signs BP (!) 196/83   Pulse 90   Temp 98.6 F (37 C) (Oral)   Resp 18   SpO2 97%   Physical Exam Vitals and nursing Haley reviewed.  Constitutional:      General: She is not in acute distress.    Appearance: She is well-developed.  HENT:     Head: Normocephalic and atraumatic.  Right Ear: External ear normal.     Left Ear: External ear normal.     Nose: Nose normal.     Mouth/Throat:     Mouth: Mucous membranes are moist.     Pharynx: Oropharynx is clear.   Eyes:     Extraocular Movements: Extraocular movements intact.     Pupils: Pupils are equal, round, and reactive to light.  Cardiovascular:     Rate and Rhythm: Normal rate and regular rhythm.  Pulmonary:     Effort: Pulmonary effort is normal.     Breath sounds: Normal breath sounds. No wheezing or rhonchi.  Abdominal:     Palpations: Abdomen is soft.     Tenderness: There is no abdominal tenderness.  Musculoskeletal:     Cervical back: Neck supple.     Right lower leg: No edema.     Left lower leg: No edema.  Skin:    General: Skin is warm and dry.  Neurological:     General: No focal deficit present.     Mental Status: She is alert. She is disoriented.     Cranial Nerves: No cranial nerve deficit.     Motor: No weakness.  Psychiatric:        Mood and Affect: Mood normal.        Behavior: Behavior normal.     ED Results / Procedures / Treatments   Labs (all labs ordered are listed, but only abnormal results are displayed) Labs Reviewed  COMPREHENSIVE METABOLIC PANEL - Abnormal; Notable for the following components:      Result Value   Potassium 3.3 (*)    Creatinine, Ser 1.50 (*)    GFR, Estimated 37 (*)    All other components within normal limits  URINALYSIS, ROUTINE W REFLEX MICROSCOPIC - Abnormal; Notable for the following components:   Color, Urine AMBER (*)    APPearance CLOUDY (*)    Ketones, ur 20 (*)    Protein, ur 100 (*)    All other components within normal limits  I-STAT VENOUS BLOOD GAS, ED - Abnormal; Notable for the following components:   pCO2, Ven 63.7 (*)    pO2, Ven 23.0 (*)    Bicarbonate 32.9 (*)    TCO2 35 (*)    Acid-Base Excess 5.0 (*)    Potassium 3.4 (*)    All other components within normal limits  RESP PANEL BY RT-PCR (FLU A&B, COVID) ARPGX2  URINE CULTURE  CBC WITH DIFFERENTIAL/PLATELET  MAGNESIUM  TSH  I-STAT VENOUS BLOOD GAS, ED    EKG EKG Interpretation  Date/Time:  Thursday July 25 2020 17:31:17 EDT Ventricular  Rate:  81 PR Interval:    QRS Duration: 100 QT Interval:  423 QTC Calculation: 491 R Axis:   37 Text Interpretation: Sinus rhythm Left ventricular hypertrophy Non-specific ST-t changes Borderline prolonged QT interval Confirmed by Lajean Saver [phone]) on 07/25/2020 5:38:28 PM   Radiology CT Head Wo Contrast  Result Date: 07/25/2020 CLINICAL DATA:  Confusion/altered mental status EXAM: CT HEAD WITHOUT CONTRAST TECHNIQUE: Contiguous axial images were obtained from the base of the skull through the vertex without intravenous contrast. COMPARISON:  Sep 27, 2013 head CT; brain MRI December 28, 2017 FINDINGS: Brain: There is age related volume loss. There is no intracranial mass, hemorrhage, extra-axial fluid collection, or midline shift. There is slight decreased attenuation in the centra semiovale bilaterally. Elsewhere brain parenchyma appears unremarkable. No evident acute infarct. Vascular: No hyperdense vessel. No appreciable vascular calcification. Skull: The bony  calvarium appears intact. Sinuses/Orbits: There is slight mucosal thickening in several ethmoid air cells. Other paranasal sinuses are clear. Orbits appear symmetric bilaterally. Other: Mastoid air cells are clear. IMPRESSION: Age related volume loss with slight periventricular small vessel disease. No acute infarct. No mass or hemorrhage. Mild mucosal thickening noted in several ethmoid air cells. Electronically Signed   By: Lowella Grip III M.D.   On: 07/25/2020 20:07   DG Chest Portable 1 View  Result Date: 07/25/2020 CLINICAL DATA:  Altered mental status EXAM: PORTABLE CHEST 1 VIEW COMPARISON:  07/12/2018 FINDINGS: Heart is upper limits normal in size. Lungs are clear. No effusions or edema. No acute bony abnormality. IMPRESSION: No active disease. Electronically Signed   By: Rolm Baptise M.D.   On: 07/25/2020 18:59    Procedures Procedures   Medications Ordered in ED Medications  lactated ringers bolus 1,000 mL (1,000 mLs  Intravenous New Bag/Given 07/25/20 2138)    ED Course  I have reviewed the triage vital signs and the nursing notes.  Pertinent labs & imaging results that were available during my care of the patient were reviewed by me and considered in my medical decision making (see chart for details).    MDM Rules/Calculators/A&P                         Patient is a 70 year old female who presents with altered mental status.  It was reported the patient has a recent history of lying in bed for the past 4 days refusing meals.  She has had decreased p.o. intake.  She has had signs and symptoms of fatigue and tachypnea.  Patient has a history of urinary tract infections and pneumonia causing altered mental status.  Additionally, patient takes multiple medications possibly contributing to her mental status.  She is hemodynamically stable on my evaluation.  Saturating 94% on 1-1/2 L which is patient's baseline for the nighttime.  She is afebrile.  Her heart rate is appropriate.  Her blood pressure is mildly elevated but normal.  Patient's presentation most concerning for possible urinary tract infection, pneumonia, hypercarbia, thyroid disorder, electrolyte abnormality, given patient's recent reports of decreased p.o. intake concern for dehydration and AKI.  LR bolus ordered.  On evaluation, no evidence for intracranial abnormality as evidenced above by CT head.  No evidence for pneumonia or other acute pulmonary abnormality.  Potassium 3.3.  Creatinine 1.5 which is increased from 3 months ago when her creatinine was 0.93.  Urine with ketones.  Covid negative and flu negative.  TSH appropriate.  Patient with hypercarbia, however normal pH and elevated bicarb likely representing compensation in a more chronic process.  No evidence of urinary tract infection on UA.  On reevaluation, patient is resting in bed comfortably.  She is still mildly confused.  Her daughter who is from [place] drove down to be with her in  the hospital.  She is at bedside.  She states that patient is still not at her mental baseline.  Discussed the possibility that patient has been taking more anxiety medications than she should.  Patient reports taking Xanax this week intermittently.  Patient still feels extremely fatigued.  After discussion with patient, daughter, and me at bedside, we decided patient will come in for hydration and observation overnight.  Medicine consulted for admission for hydration and AKI.  Patient to be admitted to hospitalist for further work-up and evaluation of her presenting symptoms.   Final Clinical Impression(s) / ED Diagnoses Final  diagnoses:  None    Rx / DC Orders ED Discharge Orders    None       , [place], MD 07/25/20 [id number]    Lajean Saver, MD 07/27/20 1355

## 2020-07-26 ENCOUNTER — Encounter (HOSPITAL_COMMUNITY): Payer: Self-pay | Admitting: Internal Medicine

## 2020-07-26 DIAGNOSIS — F32A Depression, unspecified: Secondary | ICD-10-CM | POA: Diagnosis present

## 2020-07-26 DIAGNOSIS — E785 Hyperlipidemia, unspecified: Secondary | ICD-10-CM | POA: Diagnosis present

## 2020-07-26 DIAGNOSIS — M545 Low back pain, unspecified: Secondary | ICD-10-CM | POA: Diagnosis present

## 2020-07-26 DIAGNOSIS — G928 Other toxic encephalopathy: Secondary | ICD-10-CM | POA: Diagnosis present

## 2020-07-26 DIAGNOSIS — Y92009 Unspecified place in unspecified non-institutional (private) residence as the place of occurrence of the external cause: Secondary | ICD-10-CM | POA: Diagnosis not present

## 2020-07-26 DIAGNOSIS — T40695A Adverse effect of other narcotics, initial encounter: Secondary | ICD-10-CM | POA: Diagnosis present

## 2020-07-26 DIAGNOSIS — Z20822 Contact with and (suspected) exposure to covid-19: Secondary | ICD-10-CM | POA: Diagnosis present

## 2020-07-26 DIAGNOSIS — G43909 Migraine, unspecified, not intractable, without status migrainosus: Secondary | ICD-10-CM | POA: Diagnosis present

## 2020-07-26 DIAGNOSIS — N179 Acute kidney failure, unspecified: Secondary | ICD-10-CM | POA: Diagnosis not present

## 2020-07-26 DIAGNOSIS — Z880 Allergy status to penicillin: Secondary | ICD-10-CM | POA: Diagnosis not present

## 2020-07-26 DIAGNOSIS — H9190 Unspecified hearing loss, unspecified ear: Secondary | ICD-10-CM | POA: Diagnosis present

## 2020-07-26 DIAGNOSIS — G894 Chronic pain syndrome: Secondary | ICD-10-CM | POA: Diagnosis present

## 2020-07-26 DIAGNOSIS — Z90710 Acquired absence of both cervix and uterus: Secondary | ICD-10-CM | POA: Diagnosis not present

## 2020-07-26 DIAGNOSIS — I10 Essential (primary) hypertension: Secondary | ICD-10-CM | POA: Diagnosis present

## 2020-07-26 DIAGNOSIS — G9340 Encephalopathy, unspecified: Secondary | ICD-10-CM | POA: Diagnosis present

## 2020-07-26 DIAGNOSIS — Z885 Allergy status to narcotic agent status: Secondary | ICD-10-CM | POA: Diagnosis not present

## 2020-07-26 DIAGNOSIS — E559 Vitamin D deficiency, unspecified: Secondary | ICD-10-CM | POA: Diagnosis present

## 2020-07-26 DIAGNOSIS — T424X5A Adverse effect of benzodiazepines, initial encounter: Secondary | ICD-10-CM | POA: Diagnosis present

## 2020-07-26 DIAGNOSIS — Z8249 Family history of ischemic heart disease and other diseases of the circulatory system: Secondary | ICD-10-CM | POA: Diagnosis not present

## 2020-07-26 DIAGNOSIS — F419 Anxiety disorder, unspecified: Secondary | ICD-10-CM | POA: Diagnosis present

## 2020-07-26 DIAGNOSIS — K219 Gastro-esophageal reflux disease without esophagitis: Secondary | ICD-10-CM | POA: Diagnosis present

## 2020-07-26 DIAGNOSIS — E860 Dehydration: Secondary | ICD-10-CM | POA: Diagnosis present

## 2020-07-26 DIAGNOSIS — Z825 Family history of asthma and other chronic lower respiratory diseases: Secondary | ICD-10-CM | POA: Diagnosis not present

## 2020-07-26 DIAGNOSIS — Z888 Allergy status to other drugs, medicaments and biological substances status: Secondary | ICD-10-CM | POA: Diagnosis not present

## 2020-07-26 DIAGNOSIS — Z79899 Other long term (current) drug therapy: Secondary | ICD-10-CM | POA: Diagnosis not present

## 2020-07-26 DIAGNOSIS — Z96652 Presence of left artificial knee joint: Secondary | ICD-10-CM | POA: Diagnosis present

## 2020-07-26 LAB — COMPREHENSIVE METABOLIC PANEL
ALT: 12 U/L (ref 0–44)
AST: 27 U/L (ref 15–41)
Albumin: 3.4 g/dL — ABNORMAL LOW (ref 3.5–5.0)
Alkaline Phosphatase: 83 U/L (ref 38–126)
Anion gap: 12 (ref 5–15)
BUN: 18 mg/dL (ref 8–23)
CO2: 27 mmol/L (ref 22–32)
Calcium: 9.3 mg/dL (ref 8.9–10.3)
Chloride: 99 mmol/L (ref 98–111)
Creatinine, Ser: 1.07 mg/dL — ABNORMAL HIGH (ref 0.44–1.00)
GFR, Estimated: 56 mL/min — ABNORMAL LOW (ref >60)
Glucose, Bld: 84 mg/dL (ref 70–99)
Potassium: 4 mmol/L (ref 3.5–5.1)
Sodium: 138 mmol/L (ref 135–145)
Total Bilirubin: 1 mg/dL (ref 0.3–1.2)
Total Protein: 7.2 g/dL (ref 6.5–8.1)

## 2020-07-26 LAB — TSH: TSH: 1.092 u[IU]/mL (ref 0.350–4.500)

## 2020-07-26 LAB — AMMONIA: Ammonia: 26 umol/L (ref 9–35)

## 2020-07-26 LAB — CBC
HCT: 44.9 % (ref 36.0–46.0)
Hemoglobin: 14.6 g/dL (ref 12.0–15.0)
MCH: 29 pg (ref 26.0–34.0)
MCHC: 32.5 g/dL (ref 30.0–36.0)
MCV: 89.1 fL (ref 80.0–100.0)
Platelets: 297 10*3/uL (ref 150–400)
RBC: 5.04 MIL/uL (ref 3.87–5.11)
RDW: 13.2 % (ref 11.5–15.5)
WBC: 6.5 10*3/uL (ref 4.0–10.5)
nRBC: 0 % (ref 0.0–0.2)

## 2020-07-26 LAB — RAPID URINE DRUG SCREEN, HOSP PERFORMED
Amphetamines: NOT DETECTED
Barbiturates: NOT DETECTED
Benzodiazepines: POSITIVE — AB
Cocaine: NOT DETECTED
Opiates: POSITIVE — AB
Tetrahydrocannabinol: POSITIVE — AB

## 2020-07-26 LAB — VITAMIN B12: Vitamin B-12: 1886 pg/mL — ABNORMAL HIGH (ref 180–914)

## 2020-07-26 LAB — RPR: RPR Ser Ql: NONREACTIVE

## 2020-07-26 MED ORDER — PANTOPRAZOLE SODIUM 40 MG PO TBEC
40.0000 mg | DELAYED_RELEASE_TABLET | Freq: Two times a day (BID) | ORAL | Status: DC
Start: 2020-07-26 — End: 2020-07-27
  Administered 2020-07-26 – 2020-07-27 (×3): 40 mg via ORAL
  Filled 2020-07-26 (×3): qty 1

## 2020-07-26 MED ORDER — LACTATED RINGERS IV SOLN: INTRAVENOUS | Status: AC

## 2020-07-26 MED ORDER — ENOXAPARIN SODIUM 40 MG/0.4ML ~~LOC~~ SOLN
40.0000 mg | Freq: Every day | SUBCUTANEOUS | Status: DC
  Administered 2020-07-26: 40 mg via SUBCUTANEOUS
  Filled 2020-07-26 (×2): qty 0.4

## 2020-07-26 MED ORDER — CAMPHOR-MENTHOL 0.5-0.5 % EX LOTN
TOPICAL_LOTION | CUTANEOUS | Status: DC | PRN
  Filled 2020-07-26: qty 222

## 2020-07-26 MED ORDER — ROSUVASTATIN CALCIUM 5 MG PO TABS
5.0000 mg | ORAL_TABLET | Freq: Every day | ORAL | Status: DC
  Administered 2020-07-26 – 2020-07-27 (×2): 5 mg via ORAL
  Filled 2020-07-26 (×2): qty 1

## 2020-07-26 MED ORDER — ACETAMINOPHEN 650 MG RE SUPP: 650.0000 mg | Freq: Four times a day (QID) | RECTAL | Status: DC | PRN

## 2020-07-26 MED ORDER — GABAPENTIN 100 MG PO CAPS
100.0000 mg | ORAL_CAPSULE | Freq: Once | ORAL | Status: AC
  Administered 2020-07-26: 100 mg via ORAL
  Filled 2020-07-26: qty 1

## 2020-07-26 MED ORDER — ATENOLOL 50 MG PO TABS
100.0000 mg | ORAL_TABLET | Freq: Every day | ORAL | Status: DC
  Administered 2020-07-26 – 2020-07-27 (×2): 100 mg via ORAL
  Filled 2020-07-26 (×2): qty 2

## 2020-07-26 MED ORDER — VITAMIN B-12 1000 MCG PO TABS
500.0000 ug | ORAL_TABLET | Freq: Every day | ORAL | Status: DC
  Administered 2020-07-26 – 2020-07-27 (×2): 500 ug via ORAL
  Filled 2020-07-26 (×2): qty 1

## 2020-07-26 MED ORDER — DILTIAZEM HCL ER COATED BEADS 180 MG PO CP24
180.0000 mg | ORAL_CAPSULE | Freq: Every day | ORAL | Status: DC
  Administered 2020-07-26 – 2020-07-27 (×2): 180 mg via ORAL
  Filled 2020-07-26 (×2): qty 1

## 2020-07-26 MED ORDER — ACETAMINOPHEN 325 MG PO TABS
650.0000 mg | ORAL_TABLET | Freq: Four times a day (QID) | ORAL | Status: DC | PRN
  Administered 2020-07-26: 650 mg via ORAL
  Filled 2020-07-26: qty 2

## 2020-07-26 MED ORDER — TRAZODONE HCL 50 MG PO TABS
150.0000 mg | ORAL_TABLET | Freq: Every day | ORAL | Status: DC
  Administered 2020-07-26: 150 mg via ORAL
  Filled 2020-07-26: qty 1

## 2020-07-26 MED ORDER — ALBUTEROL SULFATE HFA 108 (90 BASE) MCG/ACT IN AERS: 2.0000 | INHALATION_SPRAY | RESPIRATORY_TRACT | Status: DC | PRN

## 2020-07-26 MED ORDER — SUMATRIPTAN SUCCINATE 100 MG PO TABS
100.0000 mg | ORAL_TABLET | ORAL | Status: DC | PRN
  Administered 2020-07-26: 100 mg via ORAL
  Filled 2020-07-26 (×2): qty 1

## 2020-07-26 NOTE — Progress Notes (Addendum)
PROGRESS NOTE        PATIENT DETAILS Name: Bianca Haley Age: 70 y.o. Sex: female Date of Birth: Sep 14, 1950 Admit Date: 07/25/2020 Admitting Physician Rise Patience, MD [id number], [name], MD  Brief Narrative: Patient is a 70 y.o. female with history of chronic pain (on narcotics), HTN, HLD, mild cognitive dysfunction-presented from independent living facility for evaluation of confusion-found to have AKI-thought to have encephalopathy secondary to AKI and narcotics/polypharmacy.  See below for further details.    Significant events: 3/24>> admit for evaluation of AKI/acute toxic metabolic encephalopathy.   Significant studies: 3/24>> CT head: No acute intracranial abnormalities. 3/24>> chest x-ray: No pneumonia  Antimicrobial therapy: None  Microbiology data: 3/24>> urine culture: Pending  Procedures : None  Consults: None  DVT Prophylaxis : enoxaparin (LOVENOX) injection 40 mg Start: 07/26/20 1000   Subjective: Awake-answering some of my questions appropriately.  Still not fluent.  Daughter at bedside-better than yesterday but still not at baseline.   Assessment/Plan: Acute toxic metabolic encephalopathy: Multifactorial AKI-narcotic (Opana)/benzo (Valium) use.  Per daughter-she does have history of having similar issues when she has a UTI-however patient denies any dysuria or fever.  UA equivocal-pending culture-but since asymptomatic-and mental status improving-monitoring off antimicrobial therapy.  Per daughter at bedside-mental status has improved compared to initial presentation-but still not yet at baseline. Suspect narcotics lingering in her system due to AKI-we will allow another day or so for the narcotic to be washed out from the system.  Reassess on 3/26-if not better-we will consider further imaging with MRI/EEG.  AKI: Hemodynamically mediated-improved with IV fluids.  HTN: BP stable-continue Cardizem/Toprol  Chronic  pain syndrome/chronic low back pain: Patient's daughter-she has been on narcotics for many years.  Holding narcotics given resolving encephalopathy.  Both patient and daughter counseled extensively-have asked him to follow-up with pain management/primary MD-and to consider weaning off narcotics in the outpatient setting.  Given risk of withdrawal symptoms-suspect may need to be resumed on a lower dosing of narcotics in the next 24 hours.  Anxiety/depression: Resume trazodone/Paxil in the next day or so if clinical improvement continues.  History of migraine headaches: On as needed sumatriptan.  Generalized weakness: Due to acute illness-await PT eval.   Diet: Diet Order            Diet Heart Room service appropriate? Yes; Fluid consistency: Thin  Diet effective now                  Code Status: Full code   Family Communication: Daughter at bedside   Disposition Plan: Home vs Home with Home health vs SNF when ready for discharge Status is: Observation  The patient will require care spanning > 2 midnights and should be moved to inpatient because: Inpatient level of care appropriate due to severity of illness  Dispo: The patient is from: Home              Anticipated d/c is to: Home              Patient currently is not medically stable to d/c.   Difficult to place patient No    Barriers to Discharge: AKI-resolving encephalopathy-not yet at baseline-needs another day of inpatient monitoring to ensure back to baseline and safe discharge.  Antimicrobial agents: Anti-infectives (From admission, onward)   None       Time spent:  25- minutes-Greater than 50% of this time was spent in counseling, explanation of diagnosis, planning of further management, and coordination of care.  MEDICATIONS: Scheduled Meds: . atenolol  100 mg Oral Daily  . diltiazem  180 mg Oral Daily  . enoxaparin (LOVENOX) injection  40 mg Subcutaneous Daily  . pantoprazole  40 mg Oral BID AC  .  rosuvastatin  5 mg Oral Daily  . vitamin B-12  500 mcg Oral Daily   Continuous Infusions: . lactated ringers 100 mL/hr at 07/26/20 0245   PRN Meds:.acetaminophen **OR** acetaminophen, albuterol, SUMAtriptan   PHYSICAL EXAM: Vital signs: Vitals:   07/26/20 0049 07/26/20 0450 07/26/20 0900 07/26/20 0922  BP:  (!) 148/81  (!) 176/105  Pulse:  88  87  Resp:  19  18  Temp:  98.9 F (37.2 C) 98.3 F (36.8 C)   TempSrc:  Oral Oral   SpO2:  98%  95%  Weight:  65 kg    Height: 5' 5.5" (1.664 m)      Filed Weights   07/26/20 0048 07/26/20 0450  Weight: 67.6 kg 65 kg   Body mass index is 23.48 kg/m.   Gen Exam:Alert awake-not in any distress HEENT:atraumatic, normocephalic Chest: B/L clear to auscultation anteriorly CVS:S1S2 regular Abdomen:soft non tender, non distended Extremities:no edema Neurology: Non focal Skin: no rash  I have personally reviewed following labs and imaging studies  LABORATORY DATA: CBC: Recent Labs  Lab 07/25/20 1900 07/25/20 1930 07/26/20 0209  WBC 6.7  --  6.5  NEUTROABS 4.7  --   --   HGB 14.5 13.6 14.6  HCT 44.2 40.0 44.9  MCV 88.4  --  89.1  PLT 278  --  976    Basic Metabolic Panel: Recent Labs  Lab 07/25/20 1900 07/25/20 1930 07/26/20 0209  NA 139 141 138  K 3.3* 3.4* 4.0  CL 98  --  99  CO2 30  --  27  GLUCOSE 89  --  84  BUN 20  --  18  CREATININE 1.50*  --  1.07*  CALCIUM 9.8  --  9.3  MG 2.0  --   --     GFR: Estimated Creatinine Clearance: 44.3 mL/min (A) (by C-G formula based on SCr of 1.07 mg/dL (H)).  Liver Function Tests: Recent Labs  Lab 07/25/20 1900 07/26/20 0209  AST 23 27  ALT 14 12  ALKPHOS 93 83  BILITOT 0.7 1.0  PROT 8.0 7.2  ALBUMIN 3.7 3.4*   No results for input(s): LIPASE, AMYLASE in the last 168 hours. Recent Labs  Lab 07/26/20 0209  AMMONIA 26    Coagulation Profile: No results for input(s): INR, PROTIME in the last 168 hours.  Cardiac Enzymes: No results for input(s):  CKTOTAL, CKMB, CKMBINDEX, TROPONINI in the last 168 hours.  BNP (last 3 results) No results for input(s): PROBNP in the last 8760 hours.  Lipid Profile: No results for input(s): CHOL, HDL, LDLCALC, TRIG, CHOLHDL, LDLDIRECT in the last 72 hours.  Thyroid Function Tests: Recent Labs    07/26/20 0209  TSH 1.092    Anemia Panel: Recent Labs    07/26/20 0139  VITAMINB12 1,886*    Urine analysis:    Component Value Date/Time   COLORURINE AMBER (A) 07/25/2020 2214   APPEARANCEUR CLOUDY (A) 07/25/2020 2214   LABSPEC 1.023 07/25/2020 2214   PHURINE 5.0 07/25/2020 [place] 07/25/2020 2214   GLUCOSEU NEGATIVE 04/03/2020 [place] 07/25/2020 2214  BILIRUBINUR NEGATIVE 07/25/2020 2214   KETONESUR 20 (A) 07/25/2020 2214   PROTEINUR 100 (A) 07/25/2020 2214   UROBILINOGEN 0.2 04/03/2020 1202   NITRITE NEGATIVE 07/25/2020 2214   LEUKOCYTESUR NEGATIVE 07/25/2020 2214    Sepsis Labs: Lactic Acid, Venous    Component Value Date/Time   LATICACIDVEN 0.7 07/13/2018 0035    MICROBIOLOGY: Recent Results (from the past 240 hour(s))  Resp Panel by RT-PCR (Flu A&B, Covid) Nasopharyngeal Swab     Status: None   Collection Time: 07/25/20  5:54 PM   Specimen: Nasopharyngeal Swab; Nasopharyngeal(NP) swabs in vial transport medium  Result Value Ref Range Status   SARS Coronavirus 2 by RT PCR NEGATIVE NEGATIVE Final    Comment: (NOTE) SARS-CoV-2 target nucleic acids are NOT DETECTED.  The SARS-CoV-2 RNA is generally detectable in upper respiratory specimens during the acute phase of infection. The lowest concentration of SARS-CoV-2 viral copies this assay can detect is 138 copies/mL. A negative result does not preclude SARS-Cov-2 infection and should not be used as the sole basis for treatment or other patient management decisions. A negative result may occur with  improper specimen collection/handling, submission of specimen other than nasopharyngeal swab,  presence of viral mutation(s) within the areas targeted by this assay, and inadequate number of viral copies(<138 copies/mL). A negative result must be combined with clinical observations, patient history, and epidemiological information. The expected result is Negative.  Fact Sheet for Patients:  EntrepreneurPulse.com.au  Fact Sheet for Healthcare Providers:  IncredibleEmployment.be  This test is no t yet approved or cleared by the [place] FDA and  has been authorized for detection and/or diagnosis of SARS-CoV-2 by FDA under an Emergency Use Authorization (EUA). This EUA will remain  in effect (meaning this test can be used) for the duration of the COVID-19 declaration under Section 564(b)(1) of the Act, 21 U.S.C.section 360bbb-3(b)(1), unless the authorization is terminated  or revoked sooner.       Influenza A by PCR NEGATIVE NEGATIVE Final   Influenza B by PCR NEGATIVE NEGATIVE Final    Comment: (NOTE) The Xpert Xpress SARS-CoV-2/FLU/RSV plus assay is intended as an aid in the diagnosis of influenza from Nasopharyngeal swab specimens and should not be used as a sole basis for treatment. Nasal washings and aspirates are unacceptable for Xpert Xpress SARS-CoV-2/FLU/RSV testing.  Fact Sheet for Patients: EntrepreneurPulse.com.au  Fact Sheet for Healthcare Providers: IncredibleEmployment.be  This test is not yet approved or cleared by the [place] FDA and has been authorized for detection and/or diagnosis of SARS-CoV-2 by FDA under an Emergency Use Authorization (EUA). This EUA will remain in effect (meaning this test can be used) for the duration of the COVID-19 declaration under Section 564(b)(1) of the Act, 21 U.S.C. section 360bbb-3(b)(1), unless the authorization is terminated or revoked.  Performed at [facility], [place] [address], [place], [place] [address]     RADIOLOGY  STUDIES/RESULTS: CT Head Wo Contrast  Result Date: 07/25/2020 CLINICAL DATA:  Confusion/altered mental status EXAM: CT HEAD WITHOUT CONTRAST TECHNIQUE: Contiguous axial images were obtained from the base of the skull through the vertex without intravenous contrast. COMPARISON:  Sep 27, 2013 head CT; brain MRI December 28, 2017 FINDINGS: Brain: There is age related volume loss. There is no intracranial mass, hemorrhage, extra-axial fluid collection, or midline shift. There is slight decreased attenuation in the centra semiovale bilaterally. Elsewhere brain parenchyma appears unremarkable. No evident acute infarct. Vascular: No hyperdense vessel. No appreciable vascular calcification. Skull: The bony calvarium appears  intact. Sinuses/Orbits: There is slight mucosal thickening in several ethmoid air cells. Other paranasal sinuses are clear. Orbits appear symmetric bilaterally. Other: Mastoid air cells are clear. IMPRESSION: Age related volume loss with slight periventricular small vessel disease. No acute infarct. No mass or hemorrhage. Mild mucosal thickening noted in several ethmoid air cells. Electronically Signed   By: Lowella Grip III M.D.   On: 07/25/2020 20:07   DG Chest Portable 1 View  Result Date: 07/25/2020 CLINICAL DATA:  Altered mental status EXAM: PORTABLE CHEST 1 VIEW COMPARISON:  07/12/2018 FINDINGS: Heart is upper limits normal in size. Lungs are clear. No effusions or edema. No acute bony abnormality. IMPRESSION: No active disease. Electronically Signed   By: Rolm Baptise M.D.   On: 07/25/2020 18:59     LOS: 0 days   Oren Binet, MD  [facility]    To contact the attending provider between 7A-7P or the covering provider during after hours 7P-7A, please log into the web site [email or web address] and access using universal [place] password for that web site. If you do not have the password, please call the hospital operator.  07/26/2020, 1:45 PM

## 2020-07-26 NOTE — Progress Notes (Signed)
New Admission Note: ? Arrival Method: Stretcher  Mental Orientation: AXOX3 Telemetry: Box 3 Assessment: Completed Skin: Refer to flowsheet IV: Left Antecubital  Pain: 0/10 Tubes: None  Safety Measures: Safety Fall Prevention Plan discussed with patient. Admission: Completed [facility] [place] Orientation: Patient has been orientated to the room, unit and the staff. Family: None  Orders have been reviewed and are being implemented. Will continue to monitor the patient. Call light has been placed within reach and bed alarm has been activated.  ? Milagros Loll, RN  Phone Number: [phone]

## 2020-07-26 NOTE — H&P (Signed)
History and Physical    Bianca Haley:[id number] DOB: 1950/08/31 DOA: 07/25/2020  PCP: [name], MD  [facility]  Chief Complaint: Confusion.  HPI: Bianca Haley is a 70 y.o. female with history of chronic pain, hypertension hyperlipidemia and memory issues was brought to the ER after patient was found to be increasingly confused and not eating well.  Most of the history was provided by patient's husband to the ER physician as patient was confused.  No report of any nausea vomiting abdominal pain diarrhea chest pain or shortness of breath or fever chills.  On my exam patient states she is not sure what medications he was taking and not sure if she took more than usual.  ED Course: In the ER patient appeared confused oriented to name only.  CT head was unremarkable.  Labs are significant for worsening renal function of creatinine increasing from 0.9 in December 2021 at this time it is around 1.5.  Potassium of 3.3.  UA is unremarkable the patient did complain of dysuria.  No fever chest x-ray was unremarkable Covid test was negative.  Suspect patient may have had some polypharmacy concerning for the acute confusional state in the setting of renal failure.  Patient admitted for acute renal failure and acute encephalopathy.  Review of Systems: As per HPI, rest all negative.   Past Medical History:  Diagnosis Date  . Anemia    history of anemia  . Anxiety   . Arthritis   . Chronic low back pain   . Chronic renal failure    patient denies   . Diverticulosis   . Dupuytren contracture    Right hand, has received injection  . GERD (gastroesophageal reflux disease)   . H/O tinnitus    left  . Hearing loss    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  . Hemorrhoids   . History of colon polyps   . History of endometriosis   . History of hiatal hernia   . History of uterine fibroid   . History of UTI   . Hypertension   . Memory loss   .  Metabolic encephalopathy    History of: resolved  . Migraine   . Recurrent pneumonia 07/2018  . Seasonal allergies   . Vitamin D deficiency   . Wears glasses     Past Surgical History:  Procedure Laterality Date  . ABDOMINAL HYSTERECTOMY    . BACK SURGERY    . BIOPSY  10/20/2018   Procedure: BIOPSY;  Surgeon: [name], MD;  Location: [facility];  Service: Endoscopy;;  . BLADDER SUSPENSION    . COLONOSCOPY  02/16/2008   normal  . DENTAL SURGERY    . ESOPHAGOGASTRODUODENOSCOPY (EGD) WITH PROPOFOL N/A 10/20/2018   Procedure: ESOPHAGOGASTRODUODENOSCOPY (EGD) WITH PROPOFOL;  Surgeon: [name], MD;  Location: [facility];  Service: Endoscopy;  Laterality: N/A;  . EUS N/A 10/20/2018   Procedure: UPPER ENDOSCOPIC ULTRASOUND (EUS) RADIAL;  Surgeon: [name], MD;  Location: [facility];  Service: Endoscopy;  Laterality: N/A;  . FOOT SURGERY Left    x3  . laparoscopy     for evaluation of endometriosis  . TOTAL KNEE ARTHROPLASTY Left 07/15/2015   Procedure: TOTAL LEFT KNEE ARTHROPLASTY;  Surgeon: Paralee Cancel, MD;  Location: [facility];  Service: Orthopedics;  Laterality: Left;  . UPPER GI ENDOSCOPY       reports that she has never smoked. She has never used smokeless tobacco.  She reports that she does not drink alcohol and does not use drugs.  Allergies  Allergen Reactions  . Penicillins Shortness Of Breath and Rash    Did it involve swelling of the face/tongue/throat, SOB, or low BP? No Did it involve sudden or severe rash/hives, skin peeling, or any reaction on the inside of your mouth or nose? Yes Did you need to seek medical attention at a hospital or doctor's office? No When did it last happen?10 + years If all above answers are "NO", may proceed with cephalosporin use.    [name] Morphine And Related Swelling    Sedation/Swelling described like edema/bloating/doesn't help the pain Morphine only; tolerates other opioids.   . Cefepime Rash    Reaction  while taking both cefepime and vancomycin at [facility] (after 3 days)  . Vancomycin Rash    Reaction while taking both cefepime and vancomycin at [facility] (after 3 days)    Family History  Problem Relation Age of Onset  . Emphysema Mother   . Heart disease Mother 54       MI    Prior to Admission medications   Medication Sig Start Date End Date Taking? Authorizing Provider  acetaminophen (TYLENOL) 650 MG CR tablet Take 1,300 mg by mouth every 8 (eight) hours as needed for pain.   Yes [provider]  albuterol (VENTOLIN HFA) 108 (90 Base) MCG/ACT inhaler INHALE 2 PUFFS INTO THE LUNGS EVERY 4 HOURS AS NEEDED FOR WHEEZING OR SHORTNESS OF BREATH 05/09/20  Yes Parrett, Tammy S, NP  atenolol (TENORMIN) 100 MG tablet TAKE 1 TABLET(100 MG) BY MOUTH EVERY DAY Patient taking differently: Take 100 mg by mouth daily. 04/23/20  Yes Plotnikov, [name], MD  Carboxymethylcellulose Sodium (RETAINE [facility] OP) Place 1 application into both eyes at bedtime.   Yes [provider]  Cholecalciferol (VITAMIN D) 2000 units tablet Take 2,000 Units by mouth at bedtime.    Yes [provider]  diazepam (VALIUM) 5 MG tablet TAKE 1 TABLET BY MOUTH DAILY AS NEEDED FOR ANXIETY OR MUSCLE SPASMS Patient taking differently: Take 5 mg by mouth daily as needed for anxiety or muscle spasms. 03/27/20  Yes Plotnikov, [name], MD  diltiazem (CARDIZEM CD) 180 MG 24 hr capsule TAKE 1 CAPSULE(180 MG) BY MOUTH DAILY 04/15/20  Yes Plotnikov, [name], MD  fluticasone (FLONASE) 50 MCG/ACT nasal spray Place 2 sprays into both nostrils daily as needed for allergies or rhinitis.   Yes [provider]  gabapentin (NEURONTIN) 300 MG capsule Take 1 capsule (300 mg total) by mouth 3 (three) times daily for 30 days. Patient taking differently: Take 600-900 mg by mouth at bedtime. 07/13/18 10/17/18 Yes Arrien, Jimmy Picket, MD  Multiple Vitamin (MULTIVITAMIN WITH MINERALS) TABS tablet Take 2 tablets by mouth daily.     Yes [provider]  oxymorphone (OPANA) 10 MG tablet Take 10 mg by mouth 2 (two) times daily as needed for pain.  09/29/13  Yes Samella Parr, NP  pantoprazole (PROTONIX) 40 MG tablet TAKE 1 TABLET(40 MG) BY MOUTH TWICE DAILY BEFORE A MEAL Patient taking differently: Take 40 mg by mouth 2 (two) times daily before a meal. 04/23/20  Yes Plotnikov, [name], MD  PARoxetine (PAXIL) 40 MG tablet TAKE 1 TABLET(40 MG) BY MOUTH TWICE DAILY 04/25/20  Yes Plotnikov, [name], MD  Polyethyl Glycol-Propyl Glycol (SYSTANE OP) Place 1 drop into both eyes daily as needed (dry eyes).   Yes [provider]  polyethylene glycol (MIRALAX /  GLYCOLAX) packet Take 17 g by mouth 2 (two) times daily. Patient taking differently: Take 17 g by mouth daily as needed (constipation). Mix in 8 oz liquid and drink 07/16/15  Yes Babish, Rodman Key, PA-C  potassium chloride (KLOR-CON) 10 MEQ tablet TAKE 1 TABLET(10 MEQ) BY MOUTH DAILY Patient taking differently: Take 10 mEq by mouth daily. 06/12/20  Yes Plotnikov, [name], MD  Probiotic CAPS Take 1 capsule by mouth daily.   Yes [provider]  rosuvastatin (CRESTOR) 5 MG tablet TAKE 1 TABLET(5 MG) BY MOUTH DAILY Patient taking differently: Take 5 mg by mouth daily. 05/20/2020  Yes Plotnikov, [name], MD  SUMAtriptan (IMITREX) 100 MG tablet TAKE 1 TABLET BY MOUTH DAILY AS NEEDED FOR MIGRAINE. MAY REPEAT IN 2 HOURS IF HEADACHE PERSISTS OR RECURS Patient taking differently: Take 100 mg by mouth every 2 (two) hours as needed for migraine. 04/05/20  Yes Plotnikov, [name], MD  traZODone (DESYREL) 150 MG tablet TAKE 1 TABLET BY MOUTH AT BEDTIME AS NEEDED FOR SLEEP Patient taking differently: Take 150 mg by mouth at bedtime as needed for sleep. 05/18/2020  Yes Plotnikov, [name], MD  vitamin B-12 (CYANOCOBALAMIN) 500 MCG tablet Take 500 mcg by mouth daily.   Yes [provider]    Physical Exam: Constitutional: Moderately built and  nourished. Vitals:   07/26/20 0000 07/26/20 0019 07/26/20 0048 07/26/20 0049  BP: (!) 178/94  (!) 156/75   Pulse: 89 90 84   Resp: 13  16   Temp:  99.2 F (37.3 C) 98.8 F (37.1 C)   TempSrc:   Oral   SpO2: 94% 96% 94%   Weight:   67.6 kg   Height:    5' 5.5" (1.664 m)   Eyes: Anicteric no pallor. ENMT: No discharge from the ears eyes nose or mouth. Neck: No mass felt.  No neck rigidity. Respiratory: No rhonchi or crepitations. Cardiovascular: S1-S2 heard. Abdomen: Soft nontender bowel sounds present. Musculoskeletal: No edema. Skin: No rash. Neurologic: Alert awake oriented to her name and place.  Moving all extremities. Psychiatric: Appears confused.   Labs on Admission: I have personally reviewed following labs and imaging studies  CBC: Recent Labs  Lab 07/25/20 1900 07/25/20 1930  WBC 6.7  --   NEUTROABS 4.7  --   HGB 14.5 13.6  HCT 44.2 40.0  MCV 88.4  --   PLT 278  --    Basic Metabolic Panel: Recent Labs  Lab 07/25/20 1900 07/25/20 1930  NA 139 141  K 3.3* 3.4*  CL 98  --   CO2 30  --   GLUCOSE 89  --   BUN 20  --   CREATININE 1.50*  --   CALCIUM 9.8  --   MG 2.0  --    GFR: Estimated Creatinine Clearance: 31.6 mL/min (A) (by C-G formula based on SCr of 1.5 mg/dL (H)). Liver Function Tests: Recent Labs  Lab 07/25/20 1900  AST 23  ALT 14  ALKPHOS 93  BILITOT 0.7  PROT 8.0  ALBUMIN 3.7   No results for input(s): LIPASE, AMYLASE in the last 168 hours. No results for input(s): AMMONIA in the last 168 hours. Coagulation Profile: No results for input(s): INR, PROTIME in the last 168 hours. Cardiac Enzymes: No results for input(s): CKTOTAL, CKMB, CKMBINDEX, TROPONINI in the last 168 hours. BNP (last 3 results) No results for input(s): PROBNP in the last 8760 hours. HbA1C: No results for input(s): HGBA1C in the last 72 hours. CBG:  No results for input(s): GLUCAP in the last 168 hours. Lipid Profile: No results for input(s): CHOL, HDL,  LDLCALC, TRIG, CHOLHDL, LDLDIRECT in the last 72 hours. Thyroid Function Tests: Recent Labs    07/25/20 1900  TSH 2.738   Anemia Panel: No results for input(s): VITAMINB12, FOLATE, FERRITIN, TIBC, IRON, RETICCTPCT in the last 72 hours. Urine analysis:    Component Value Date/Time   COLORURINE AMBER (A) 07/25/2020 2214   APPEARANCEUR CLOUDY (A) 07/25/2020 2214   LABSPEC 1.023 07/25/2020 2214   PHURINE 5.0 07/25/2020 2214   GLUCOSEU NEGATIVE 07/25/2020 2214   GLUCOSEU NEGATIVE 04/03/2020 1202   HGBUR NEGATIVE 07/25/2020 2214   BILIRUBINUR NEGATIVE 07/25/2020 2214   KETONESUR 20 (A) 07/25/2020 2214   PROTEINUR 100 (A) 07/25/2020 2214   UROBILINOGEN 0.2 04/03/2020 1202   NITRITE NEGATIVE 07/25/2020 2214   LEUKOCYTESUR NEGATIVE 07/25/2020 2214   Sepsis Labs: @LABRCNTIP (procalcitonin:4,lacticidven:4) ) Recent Results (from the past 240 hour(s))  Resp Panel by RT-PCR (Flu A&B, Covid) Nasopharyngeal Swab     Status: None   Collection Time: 07/25/20  5:54 PM   Specimen: Nasopharyngeal Swab; Nasopharyngeal(NP) swabs in vial transport medium  Result Value Ref Range Status   SARS Coronavirus 2 by RT PCR NEGATIVE NEGATIVE Final    Comment: (NOTE) SARS-CoV-2 target nucleic acids are NOT DETECTED.  The SARS-CoV-2 RNA is generally detectable in upper respiratory specimens during the acute phase of infection. The lowest concentration of SARS-CoV-2 viral copies this assay can detect is 138 copies/mL. A negative result does not preclude SARS-Cov-2 infection and should not be used as the sole basis for treatment or other patient management decisions. A negative result may occur with  improper specimen collection/handling, submission of specimen other than nasopharyngeal swab, presence of viral mutation(s) within the areas targeted by this assay, and inadequate number of viral copies(<138 copies/mL). A negative result must be combined with clinical observations, patient history, and  epidemiological information. The expected result is Negative.  Fact Sheet for Patients:  EntrepreneurPulse.com.au  Fact Sheet for Healthcare Providers:  IncredibleEmployment.be  This test is no t yet approved or cleared by the [place] FDA and  has been authorized for detection and/or diagnosis of SARS-CoV-2 by FDA under an Emergency Use Authorization (EUA). This EUA will remain  in effect (meaning this test can be used) for the duration of the COVID-19 declaration under Section 564(b)(1) of the Act, 21 U.S.C.section 360bbb-3(b)(1), unless the authorization is terminated  or revoked sooner.       Influenza A by PCR NEGATIVE NEGATIVE Final   Influenza B by PCR NEGATIVE NEGATIVE Final    Comment: (NOTE) The Xpert Xpress SARS-CoV-2/FLU/RSV plus assay is intended as an aid in the diagnosis of influenza from Nasopharyngeal swab specimens and should not be used as a sole basis for treatment. Nasal washings and aspirates are unacceptable for Xpert Xpress SARS-CoV-2/FLU/RSV testing.  Fact Sheet for Patients: EntrepreneurPulse.com.au  Fact Sheet for Healthcare Providers: IncredibleEmployment.be  This test is not yet approved or cleared by the [place] FDA and has been authorized for detection and/or diagnosis of SARS-CoV-2 by FDA under an Emergency Use Authorization (EUA). This EUA will remain in effect (meaning this test can be used) for the duration of the COVID-19 declaration under Section 564(b)(1) of the Act, 21 U.S.C. section 360bbb-3(b)(1), unless the authorization is terminated or revoked.  Performed at [facility], [place] [address], [place], [place] [address]      Radiological Exams on Admission: CT Head Wo  Contrast  Result Date: 07/25/2020 CLINICAL DATA:  Confusion/altered mental status EXAM: CT HEAD WITHOUT CONTRAST TECHNIQUE: Contiguous axial images were obtained from the base of  the skull through the vertex without intravenous contrast. COMPARISON:  Sep 27, 2013 head CT; brain MRI December 28, 2017 FINDINGS: Brain: There is age related volume loss. There is no intracranial mass, hemorrhage, extra-axial fluid collection, or midline shift. There is slight decreased attenuation in the centra semiovale bilaterally. Elsewhere brain parenchyma appears unremarkable. No evident acute infarct. Vascular: No hyperdense vessel. No appreciable vascular calcification. Skull: The bony calvarium appears intact. Sinuses/Orbits: There is slight mucosal thickening in several ethmoid air cells. Other paranasal sinuses are clear. Orbits appear symmetric bilaterally. Other: Mastoid air cells are clear. IMPRESSION: Age related volume loss with slight periventricular small vessel disease. No acute infarct. No mass or hemorrhage. Mild mucosal thickening noted in several ethmoid air cells. Electronically Signed   By: Lowella Grip III M.D.   On: 07/25/2020 20:07   DG Chest Portable 1 View  Result Date: 07/25/2020 CLINICAL DATA:  Altered mental status EXAM: PORTABLE CHEST 1 VIEW COMPARISON:  07/12/2018 FINDINGS: Heart is upper limits normal in size. Lungs are clear. No effusions or edema. No acute bony abnormality. IMPRESSION: No active disease. Electronically Signed   By: Rolm Baptise M.D.   On: 07/25/2020 18:59    EKG: Independently reviewed.  Normal sinus rhythm LVH.  Assessment/Plan Principal Problem:   Acute encephalopathy Active Problems:   Essential hypertension   ARF (acute renal failure) ([place])    1. Acute encephalopathy -could be multifactorial including acute renal failure with possible polypharmacy and also medications in the setting of renal failure.  At this time I am holding off patient's Opana, gabapentin and trazodone, paroxetine.  Will check ammonia levels, TSH RPR and B12 and urine drug screen.  Follow metabolic panel continue with hydration.  Restart medications slowly once  creatinine improves and mental status improves.  Patient is afebrile.   2. Acute renal failure likely could be from poor oral intake.  We will gently hydrate follow metabolic panel UA unremarkable. 3. Poor appetite -cause not clear.  Abdomen appears benign LFTs are normal.  Patient has had previous history of dilated bile ducts for which patient had followed with Dr. Ardis Hughs of gastroenterology.  We will continue to monitor closely. 4. Hypertension on Cardizem and Toprol. 5. History of chronic pain see #1 presently holding the medication due to encephalopathy in the setting of renal failure.   DVT prophylaxis: Lovenox. Code Status: Full code. Family Communication: We will need to discuss with patient's husband. Disposition Plan: Back to facility when stable. Consults called: None. Admission status: Observation.   Rise Patience MD [facility] Pager [phone].  If 7PM-7AM, please contact night-coverage [email or web address] Password [email or web address]  07/26/2020, 1:10 AM

## 2020-07-26 NOTE — ED Notes (Signed)
Pt's daughter notified that pt is going upstairs.

## 2020-07-26 NOTE — Plan of Care (Signed)
  Problem: Education: Goal: Knowledge of General Education information will improve Description Including pain rating scale, medication(s)/side effects and non-pharmacologic comfort measures Outcome: Progressing   

## 2020-07-27 DIAGNOSIS — G9340 Encephalopathy, unspecified: Secondary | ICD-10-CM | POA: Diagnosis not present

## 2020-07-27 LAB — BASIC METABOLIC PANEL
Anion gap: 10 (ref 5–15)
BUN: 7 mg/dL — ABNORMAL LOW (ref 8–23)
CO2: 29 mmol/L (ref 22–32)
Calcium: 8.8 mg/dL — ABNORMAL LOW (ref 8.9–10.3)
Chloride: 98 mmol/L (ref 98–111)
Creatinine, Ser: 0.81 mg/dL (ref 0.44–1.00)
GFR, Estimated: >60 mL/min (ref >60)
Glucose, Bld: 147 mg/dL — ABNORMAL HIGH (ref 70–99)
Potassium: 3.2 mmol/L — ABNORMAL LOW (ref 3.5–5.1)
Sodium: 137 mmol/L (ref 135–145)

## 2020-07-27 MED ORDER — POTASSIUM CHLORIDE CRYS ER 20 MEQ PO TBCR
40.0000 meq | EXTENDED_RELEASE_TABLET | Freq: Once | ORAL | Status: AC
  Administered 2020-07-27: 40 meq via ORAL
  Filled 2020-07-27: qty 2

## 2020-07-27 MED ORDER — POTASSIUM CHLORIDE 10 MEQ/100ML IV SOLN
10.0000 meq | INTRAVENOUS | Status: AC
  Administered 2020-07-27 (×2): 10 meq via INTRAVENOUS
  Filled 2020-07-27 (×2): qty 100

## 2020-07-27 MED ORDER — LEVOFLOXACIN 500 MG PO TABS
500.0000 mg | ORAL_TABLET | Freq: Every day | ORAL | Status: DC
  Administered 2020-07-27: 500 mg via ORAL
  Filled 2020-07-27: qty 1

## 2020-07-27 MED ORDER — POTASSIUM CHLORIDE CRYS ER 20 MEQ PO TBCR
20.0000 meq | EXTENDED_RELEASE_TABLET | Freq: Once | ORAL | Status: AC
  Administered 2020-07-27: 20 meq via ORAL
  Filled 2020-07-27: qty 1

## 2020-07-27 MED ORDER — OXYMORPHONE HCL 10 MG PO TABS: 10.0000 mg | ORAL_TABLET | Freq: Every day | ORAL | 0 refills | Status: DC | PRN

## 2020-07-27 MED ORDER — TRAZODONE HCL 100 MG PO TABS: 100.0000 mg | ORAL_TABLET | Freq: Every evening | ORAL | 0 refills | Status: DC | PRN

## 2020-07-27 MED ORDER — ONDANSETRON HCL 4 MG/2ML IJ SOLN
4.0000 mg | Freq: Four times a day (QID) | INTRAMUSCULAR | Status: DC | PRN
  Administered 2020-07-27: 4 mg via INTRAVENOUS

## 2020-07-27 MED ORDER — LEVOFLOXACIN 500 MG PO TABS: 500.0000 mg | ORAL_TABLET | Freq: Every day | ORAL | 0 refills | Status: DC

## 2020-07-27 NOTE — TOC Transition Note (Signed)
Transition of Care [facility]) - CM/SW Discharge Note   Patient Details  Name: Bianca Haley MRN: [id number] Date of Birth: [birth year]  [facility]) CM/SW Contact:  Bartholomew Crews, RN Phone Number: [phone] 07/27/2020, 11:29 AM   Clinical Narrative:     Spoke with patient at the bedside. PTA home with spouse at independent living, [facility]. Has all needed DME to include RW and rollator and transport chair. Sister to provide transport home. Discussed recommendations for [facility] RN, PT. Patient stated that this is provided through [facility]. HH orders faxed to [facility] at [phone]. No further TOC needs identified.   Final next level of care: [place] Barriers to Discharge: No Barriers Identified   Patient Goals and CMS Choice Patient states their goals for this hospitalization and ongoing recovery are:: return to independent living at [facility] with spouse CMS Medicare.gov Compare Post Acute Care list provided to:: Patient Choice offered to / list presented to : Patient  Discharge Placement                       Discharge Plan and Services                DME Arranged: N/A DME Agency: NA       HH Arranged: PT [place] Agency: Other - See comment Secondary school teacher)        Social Determinants of Health (SDOH) Interventions     Readmission Risk Interventions No flowsheet data found.

## 2020-07-27 NOTE — Discharge Summary (Signed)
Bianca Haley W[facility]:[id number] DOB: 11-Aug-1950 DOA: 07/25/2020  PCP: [name], MD  Admit date: 07/25/2020  Discharge date: 07/27/2020  Admitted From: Home   Disposition:  Home   Recommendations for Outpatient Follow-up:   Follow up with PCP in 1-2 weeks  PCP Please obtain BMP/CBC, 2 view CXR in 1week,  (see Discharge instructions)   PCP Please follow up on the following pending results: reduce sedating Meds if possible   Home Health: PT, RN if qualifies  Equipment/Devices: None  Consultations: None  Discharge Condition: Stable    CODE STATUS: Full    Diet Recommendation: Heart Healthy   Diet Order            Diet - low sodium heart healthy           Diet Heart Room service appropriate? Yes; Fluid consistency: Thin  Diet effective now                  Chief Complaint  Patient presents with  . Altered Mental Status     Brief history of present illness from the day of admission and additional interim summary    Patient is a 70 y.o. female with history of chronic pain (on narcotics), HTN, HLD, mild cognitive dysfunction-presented from independent living facility for evaluation of confusion-found to have AKI-thought to have encephalopathy secondary to AKI and narcotics/polypharmacy.  See below for further details.    Significant events: 3/24>> admit for evaluation of AKI/acute toxic metabolic encephalopathy.   Significant studies: 3/24>> CT head: No acute intracranial abnormalities. 3/24>> chest x-ray: No pneumonia                                                                 Hospital Course     Acute toxic metabolic encephalopathy: Multifactorial AKI-narcotic (Opana)/benzo (Valium) use.  Most of her symptoms are medication related, no focal deficits, head CT unremarkable, UA was  borderline at best, offending medications were held and mentation has improved, she is now close to her baseline, extensively counseled not to overuse sedating medications and to follow with PCP within a week to reduce offending medications as much as possible, also discussed with daughter over the phone on the day of discharge.  UA was borderline however since she is being discharged and cultures are not final yet we will give her 3 days of oral Levaquin total starting today, mentation is already back to baseline.  AKI: Due to dehydration resolved after hydration with IV fluids.  HTN: BP stable-continue Cardizem/Toprol  Chronic pain syndrome/chronic low back pain: Patient's daughter-she has been on narcotics for many years.  Holding narcotics given resolving encephalopathy.  Both patient and daughter counseled extensively-have asked him to follow-up with pain management/primary MD-and to consider weaning off narcotics in the outpatient setting.  Have dropped Opana dose.  Anxiety/depression: Resume trazodone/Paxil in the next day or so if clinical improvement continues.Dropped Trazadone dose.  History of migraine headaches: On as needed sumatriptan.  Generalized weakness: Due to acute illness- improved, HHPT if qualifies.  Discharge diagnosis     Principal Problem:   Acute encephalopathy Active Problems:   Essential hypertension   ARF (acute renal failure) (HCC)   Encephalopathy acute    Discharge instructions    Discharge Instructions    Diet - low sodium heart healthy   Complete by: As directed    Discharge instructions   Complete by: As directed    Follow with Primary MD Plotnikov, [name], MD in 7 days   Get CBC, CMP, 2 view Chest X ray -  checked next visit within 1 week by Primary MD    Activity: As tolerated with Full fall precautions use walker/cane & assistance as needed  Disposition Home    Diet: Heart Healthy   Special Instructions: If you have smoked or  chewed Tobacco  in the last 2 yrs please stop smoking, stop any regular Alcohol  and or any Recreational drug use.  On your next visit with your primary care physician please Get Medicines reviewed and adjusted.  Please request your Prim.MD to go over all Hospital Tests and Procedure/Radiological results at the follow up, please get all Hospital records sent to your Prim MD by signing hospital release before you go home.  If you experience worsening of your admission symptoms, develop shortness of breath, life threatening emergency, suicidal or homicidal thoughts you must seek medical attention immediately by calling 911 or calling your MD immediately  if symptoms less severe.  You Must read complete instructions/literature along with all the possible adverse reactions/side effects for all the Medicines you take and that have been prescribed to you. Take any new Medicines after you have completely understood and accpet all the possible adverse reactions/side effects.   Do not drive, operate heavy machinery, perform activities at heights, swimming or participation in water activities or provide baby sitting services if your were admitted for syncope or siezures until you have seen by Primary MD or a Neurologist and advised to do so again.  Do not drive when taking Pain medications.  Do not take more than prescribed Pain, Sleep and Anxiety Medications   Increase activity slowly   Complete by: As directed       Discharge Medications   Allergies as of 07/27/2020      Reactions   Penicillins Shortness Of Breath, Rash   Did it involve swelling of the face/tongue/throat, SOB, or low BP? No Did it involve sudden or severe rash/hives, skin peeling, or any reaction on the inside of your mouth or nose? Yes Did you need to seek medical attention at a hospital or doctor's office? No When did it last happen?10 + years If all above answers are "NO", may proceed with cephalosporin use.   Morphine  And Related Swelling   Sedation/Swelling described like edema/bloating/doesn't help the pain Morphine only; tolerates other opioids.    Cefepime Rash   Reaction while taking both cefepime and vancomycin at [facility] (after 3 days)   Vancomycin Rash   Reaction while taking both cefepime and vancomycin at [facility] (after 3 days)      Medication List    TAKE these medications   acetaminophen 650 MG CR tablet Commonly known as: TYLENOL Take 1,300 mg by mouth every 8 (eight) hours as needed for pain.  albuterol 108 (90 Base) MCG/ACT inhaler Commonly known as: Ventolin HFA INHALE 2 PUFFS INTO THE LUNGS EVERY 4 HOURS AS NEEDED FOR WHEEZING OR SHORTNESS OF BREATH   atenolol 100 MG tablet Commonly known as: TENORMIN TAKE 1 TABLET(100 MG) BY MOUTH EVERY DAY What changed: See the new instructions.   diazepam 5 MG tablet Commonly known as: VALIUM TAKE 1 TABLET BY MOUTH DAILY AS NEEDED FOR ANXIETY OR MUSCLE SPASMS What changed: See the new instructions.   diltiazem 180 MG 24 hr capsule Commonly known as: CARDIZEM CD TAKE 1 CAPSULE(180 MG) BY MOUTH DAILY   fluticasone 50 MCG/ACT nasal spray Commonly known as: FLONASE Place 2 sprays into both nostrils daily as needed for allergies or rhinitis.   gabapentin 300 MG capsule Commonly known as: NEURONTIN Take 1 capsule (300 mg total) by mouth 3 (three) times daily for 30 days. What changed:   how much to take  when to take this   levofloxacin 500 MG tablet Commonly known as: LEVAQUIN Take 1 tablet (500 mg total) by mouth daily.   multivitamin with minerals Tabs tablet Take 2 tablets by mouth daily.   oxymorphone 10 MG tablet Commonly known as: OPANA Take 1 tablet (10 mg total) by mouth daily as needed for pain. What changed: when to take this   pantoprazole 40 MG tablet Commonly known as: PROTONIX TAKE 1 TABLET(40 MG) BY MOUTH TWICE DAILY BEFORE A MEAL What changed: See the new instructions.   PARoxetine 40 MG tablet Commonly  known as: PAXIL TAKE 1 TABLET(40 MG) BY MOUTH TWICE DAILY   polyethylene glycol 17 g packet Commonly known as: MIRALAX / GLYCOLAX Take 17 g by mouth 2 (two) times daily. What changed:   when to take this  reasons to take this  additional instructions   potassium chloride 10 MEQ tablet Commonly known as: KLOR-CON TAKE 1 TABLET(10 MEQ) BY MOUTH DAILY What changed: See the new instructions.   Probiotic Caps Take 1 capsule by mouth daily.   RETAINE [facility] OP Place 1 application into both eyes at bedtime.   rosuvastatin 5 MG tablet Commonly known as: CRESTOR TAKE 1 TABLET(5 MG) BY MOUTH DAILY What changed: See the new instructions.   SUMAtriptan 100 MG tablet Commonly known as: IMITREX TAKE 1 TABLET BY MOUTH DAILY AS NEEDED FOR MIGRAINE. MAY REPEAT IN 2 HOURS IF HEADACHE PERSISTS OR RECURS What changed: See the new instructions.   SYSTANE OP Place 1 drop into both eyes daily as needed (dry eyes).   traZODone 100 MG tablet Commonly known as: DESYREL Take 1 tablet (100 mg total) by mouth at bedtime as needed for sleep. for sleep What changed:   medication strength  how much to take  reasons to take this   vitamin B-12 500 MCG tablet Commonly known as: CYANOCOBALAMIN Take 500 mcg by mouth daily.   Vitamin D 50 MCG (2000 UT) tablet Take 2,000 Units by mouth at bedtime.        Follow-up Information    Plotnikov, [name], MD. Schedule an appointment as soon as possible for a visit in 1 week(s).   Specialty: Internal Medicine Contact information: [place] [address] [phone]               Major procedures and Radiology Reports - PLEASE review detailed and final reports thoroughly  -       CT Head Wo Contrast  Result Date: 07/25/2020 CLINICAL DATA:  Confusion/altered mental status EXAM: CT HEAD  WITHOUT CONTRAST TECHNIQUE: Contiguous axial images were obtained from the base of the skull through the vertex without intravenous  contrast. COMPARISON:  Sep 27, 2013 head CT; brain MRI December 28, 2017 FINDINGS: Brain: There is age related volume loss. There is no intracranial mass, hemorrhage, extra-axial fluid collection, or midline shift. There is slight decreased attenuation in the centra semiovale bilaterally. Elsewhere brain parenchyma appears unremarkable. No evident acute infarct. Vascular: No hyperdense vessel. No appreciable vascular calcification. Skull: The bony calvarium appears intact. Sinuses/Orbits: There is slight mucosal thickening in several ethmoid air cells. Other paranasal sinuses are clear. Orbits appear symmetric bilaterally. Other: Mastoid air cells are clear. IMPRESSION: Age related volume loss with slight periventricular small vessel disease. No acute infarct. No mass or hemorrhage. Mild mucosal thickening noted in several ethmoid air cells. Electronically Signed   By: Lowella Grip III M.D.   On: 07/25/2020 20:07   DG Chest Portable 1 View  Result Date: 07/25/2020 CLINICAL DATA:  Altered mental status EXAM: PORTABLE CHEST 1 VIEW COMPARISON:  07/12/2018 FINDINGS: Heart is upper limits normal in size. Lungs are clear. No effusions or edema. No acute bony abnormality. IMPRESSION: No active disease. Electronically Signed   By: Rolm Baptise M.D.   On: 07/25/2020 18:59    Micro Results     Recent Results (from the past 240 hour(s))  Resp Panel by RT-PCR (Flu A&B, Covid) Nasopharyngeal Swab     Status: None   Collection Time: 07/25/20  5:54 PM   Specimen: Nasopharyngeal Swab; Nasopharyngeal(NP) swabs in vial transport medium  Result Value Ref Range Status   SARS Coronavirus 2 by RT PCR NEGATIVE NEGATIVE Final    Comment: (NOTE) SARS-CoV-2 target nucleic acids are NOT DETECTED.  The SARS-CoV-2 RNA is generally detectable in upper respiratory specimens during the acute phase of infection. The lowest concentration of SARS-CoV-2 viral copies this assay can detect is 138 copies/mL. A negative result  does not preclude SARS-Cov-2 infection and should not be used as the sole basis for treatment or other patient management decisions. A negative result may occur with  improper specimen collection/handling, submission of specimen other than nasopharyngeal swab, presence of viral mutation(s) within the areas targeted by this assay, and inadequate number of viral copies(<138 copies/mL). A negative result must be combined with clinical observations, patient history, and epidemiological information. The expected result is Negative.  Fact Sheet for Patients:  EntrepreneurPulse.com.au  Fact Sheet for Healthcare Providers:  IncredibleEmployment.be  This test is no t yet approved or cleared by the [place] FDA and  has been authorized for detection and/or diagnosis of SARS-CoV-2 by FDA under an Emergency Use Authorization (EUA). This EUA will remain  in effect (meaning this test can be used) for the duration of the COVID-19 declaration under Section 564(b)(1) of the Act, 21 U.S.C.section 360bbb-3(b)(1), unless the authorization is terminated  or revoked sooner.       Influenza A by PCR NEGATIVE NEGATIVE Final   Influenza B by PCR NEGATIVE NEGATIVE Final    Comment: (NOTE) The Xpert Xpress SARS-CoV-2/FLU/RSV plus assay is intended as an aid in the diagnosis of influenza from Nasopharyngeal swab specimens and should not be used as a sole basis for treatment. Nasal washings and aspirates are unacceptable for Xpert Xpress SARS-CoV-2/FLU/RSV testing.  Fact Sheet for Patients: EntrepreneurPulse.com.au  Fact Sheet for Healthcare Providers: IncredibleEmployment.be  This test is not yet approved or cleared by the [place] FDA and has been authorized for detection and/or diagnosis of SARS-CoV-2 by  FDA under an Emergency Use Authorization (EUA). This EUA will remain in effect (meaning this test can be used) for  the duration of the COVID-19 declaration under Section 564(b)(1) of the Act, 21 U.S.C. section 360bbb-3(b)(1), unless the authorization is terminated or revoked.  Performed at [facility], [place] [address] [place], [place] [address]     Today   Subjective    Caedence Snowden today has no headache,no chest abdominal pain,no new weakness tingling or numbness, feels much better wants to go home today.    Objective   Blood pressure 124/60, pulse 72, temperature 98.3 F (36.8 C), resp. rate 18, height 5' 5.5" (1.664 m), weight 65 kg, SpO2 97 %.   Intake/Output Summary (Last 24 hours) at 07/27/2020 0854 Last data filed at 07/26/2020 2300 Gross per 24 hour  Intake 2399.14 ml  Output 600 ml  Net 1799.14 ml    Exam  Awake Alert, No new F.N deficits, Normal affect .AT,PERRAL Supple Neck,No JVD, No cervical lymphadenopathy appriciated.  Symmetrical Chest wall movement, Good air movement bilaterally, CTAB RRR,No Gallops,Rubs or new Murmurs, No Parasternal Heave +ve B.Sounds, Abd Soft, Non tender, No organomegaly appriciated, No rebound -guarding or rigidity. No Cyanosis, Clubbing or edema, No new Rash or bruise   Data Review   CBC w Diff:  Lab Results  Component Value Date   WBC 6.5 07/26/2020   HGB 14.6 07/26/2020   HCT 44.9 07/26/2020   PLT 297 07/26/2020   LYMPHOPCT 17 07/25/2020   MONOPCT 9 07/25/2020   EOSPCT 2 07/25/2020   BASOPCT 1 07/25/2020    CMP:  Lab Results  Component Value Date   NA 137 07/27/2020   NA 142 11/23/2017   K 3.2 (L) 07/27/2020   CL 98 07/27/2020   CO2 29 07/27/2020   BUN 7 (L) 07/27/2020   BUN 20 11/23/2017   CREATININE 0.81 07/27/2020   PROT 7.2 07/26/2020   PROT 6.8 11/23/2017   ALBUMIN 3.4 (L) 07/26/2020   ALBUMIN 4.5 11/23/2017   BILITOT 1.0 07/26/2020   BILITOT 0.2 11/23/2017   ALKPHOS 83 07/26/2020   AST 27 07/26/2020   ALT 12 07/26/2020  .   Total Time in preparing paper work, data evaluation and todays exam - 49  minutes  Lala Lund M.D on 07/27/2020 at 8:54 AM  [facility]

## 2020-07-27 NOTE — Progress Notes (Signed)
DISCHARGE NOTE HOME Bianca Haley to be discharged to home per MD order. Discussed prescriptions and follow up appointments with the patient. Prescriptions given to patient; medication list explained in detail. Patient verbalized understanding.  Skin clean, dry and intact without evidence of skin break down, no evidence of skin tears noted. IV catheter discontinued intact. Site without signs and symptoms of complications. Dressing and pressure applied. Pt denies pain at the site currently. No complaints noted.  Patient free of lines, drains, and wounds.   An After Visit Summary (AVS) was printed and given to the patient. Patient escorted via wheelchair, and discharged home via private auto.  [place], Zenon Mayo, RN

## 2020-07-27 NOTE — Evaluation (Signed)
Physical Therapy Evaluation Patient Details Name: Bianca Haley MRN: [id number] DOB: [birth year] Today's Date: 07/27/2020   History of Present Illness  70 y.o. female with history of chronic pain (on narcotics), HTN, HLD, mild cognitive dysfunction-presented from independent living facility for evaluation of confusion-found to have AKI-thought to have encephalopathy secondary to AKI and narcotics/polypharmacy.    Clinical Impression  PT eval complete. Pt independent bed mobility, transfers, and in room ambulation. Supervision provided for hallway ambulation 250' without AD. Steady gait. No LOB noted. No further PT intervention indicated. PT signing off.     Follow Up Recommendations No PT follow up    Equipment Recommendations  None recommended by PT    Recommendations for Other Services       Precautions / Restrictions Precautions Precautions: None Restrictions Weight Bearing Restrictions: No      Mobility  Bed Mobility Overal bed mobility: Independent                  Transfers Overall transfer level: Independent Equipment used: None                Ambulation/Gait Ambulation/Gait assistance: Supervision Gait Distance (Feet): 250 Feet Assistive device: None Gait Pattern/deviations: Step-through pattern;Decreased stride length Gait velocity: decreased Gait velocity interpretation: 1.31 - 2.62 ft/sec, indicative of limited community ambulator General Gait Details: supervision for safety, no physical assist, no LOB noted  Stairs            Wheelchair Mobility    Modified Rankin (Stroke Patients Only)       Balance Overall balance assessment: Mild deficits observed, not formally tested                                           Pertinent Vitals/Pain Pain Assessment: No/denies pain    Home Living Family/patient expects to be discharged to:: Private residence Living Arrangements: Spouse/significant other Available Help  at Discharge: Family;Friend(s) Type of Home: Independent living facility Home Access: Level entry     Home Layout: One level Home Equipment: Walker - 2 wheels;Shower seat - built in;Grab bars - toilet;Grab bars - tub/shower      Prior Function Level of Independence: Independent               Hand Dominance   Dominant Hand: Right    Extremity/Trunk Assessment   Upper Extremity Assessment Upper Extremity Assessment: Overall WFL for tasks assessed    Lower Extremity Assessment Lower Extremity Assessment: Overall WFL for tasks assessed    Cervical / Trunk Assessment Cervical / Trunk Assessment: Normal  Communication   Communication: No difficulties  Cognition Arousal/Alertness: Awake/alert Behavior During Therapy: WFL for tasks assessed/performed Overall Cognitive Status: Within Functional Limits for tasks assessed                                        General Comments      Exercises     Assessment/Plan    PT Assessment Patent does not need any further PT services  PT Problem List         PT Treatment Interventions      PT Goals (Current goals can be found in the Care Plan section)  Acute Rehab PT Goals Patient Stated Goal: home PT Goal Formulation: All assessment and education complete,  DC therapy    Frequency     Barriers to discharge        Co-evaluation               AM-PAC PT "6 Clicks" Mobility  Outcome Measure Help needed turning from your back to your side while in a flat bed without using bedrails?: None Help needed moving from lying on your back to sitting on the side of a flat bed without using bedrails?: None Help needed moving to and from a bed to a chair (including a wheelchair)?: None Help needed standing up from a chair using your arms (e.g., wheelchair or bedside chair)?: None Help needed to walk in hospital room?: None Help needed climbing 3-5 steps with a railing? : A Little 6 Click Score: 23    End  of Session   Activity Tolerance: Patient tolerated treatment well Patient left: in bed;with call bell/phone within reach Nurse Communication: Mobility status PT Visit Diagnosis: Difficulty in walking, not elsewhere classified (R26.2)    Time: 5521-7471 PT Time Calculation (min) (ACUTE ONLY): 18 min   Charges:   PT Evaluation $PT Eval Low Complexity: 1 Low          Lorrin Goodell, PT  Office # [phone] Pager [phone]   Lorriane Shire 07/27/2020, 10:58 AM

## 2020-07-27 NOTE — Discharge Instructions (Signed)
Follow with Primary MD Plotnikov, [name], MD in 7 days   Get CBC, CMP, 2 view Chest X ray -  checked next visit within 1 week by Primary MD    Activity: As tolerated with Full fall precautions use walker/cane & assistance as needed  Disposition Home    Diet: Heart Healthy   Special Instructions: If you have smoked or chewed Tobacco  in the last 2 yrs please stop smoking, stop any regular Alcohol  and or any Recreational drug use.  On your next visit with your primary care physician please Get Medicines reviewed and adjusted.  Please request your Prim.MD to go over all Hospital Tests and Procedure/Radiological results at the follow up, please get all Hospital records sent to your Prim MD by signing hospital release before you go home.  If you experience worsening of your admission symptoms, develop shortness of breath, life threatening emergency, suicidal or homicidal thoughts you must seek medical attention immediately by calling 911 or calling your MD immediately  if symptoms less severe.  You Must read complete instructions/literature along with all the possible adverse reactions/side effects for all the Medicines you take and that have been prescribed to you. Take any new Medicines after you have completely understood and accpet all the possible adverse reactions/side effects.   Do not drive, operate heavy machinery, perform activities at heights, swimming or participation in water activities or provide baby sitting services if your were admitted for syncope or siezures until you have seen by Primary MD or a Neurologist and advised to do so again.  Do not drive when taking Pain medications.  Do not take more than prescribed Pain, Sleep and Anxiety Medications

## 2020-07-28 LAB — URINE CULTURE: Culture: 10000 — AB

## 2020-07-29 ENCOUNTER — Telehealth: Payer: Self-pay

## 2020-07-29 DIAGNOSIS — Z1159 Encounter for screening for other viral diseases: Secondary | ICD-10-CM | POA: Diagnosis not present

## 2020-07-29 DIAGNOSIS — Z20828 Contact with and (suspected) exposure to other viral communicable diseases: Secondary | ICD-10-CM | POA: Diagnosis not present

## 2020-07-29 NOTE — Telephone Encounter (Signed)
Transition Care Management Follow-up Telephone Call  Date of discharge and from where: 07/27/2020 from [facility]  How have you been since you were released from the hospital? Feeling good, but still a little weak  Any questions or concerns? No  Items Reviewed:  Did the pt receive and understand the discharge instructions provided? Yes   Medications obtained and verified? Yes   Other? Yes   Any new allergies since your discharge? No   Dietary orders reviewed? Heart Healthy Diet  Do you have support at home? Yes  (husband)  Home Care and Equipment/Supplies: Were home health services ordered? no If so, what is the name of the agency? n/a  Has the agency set up a time to come to the patient's home? not applicable Were any new equipment or medical supplies ordered?  No What is the name of the medical supply agency? n/a Were you able to get the supplies/equipment? not applicable Do you have any questions related to the use of the equipment or supplies? No  Functional Questionnaire: (I = Independent and D = Dependent) ADLs: I  Bathing/Dressing- I  Meal Prep- I  Eating- I  Maintaining continence- I  Transferring/Ambulation- I, with walker  Managing Meds- I  Follow up appointments reviewed:   PCP Hospital f/u appt confirmed? Yes  Scheduled to see Lew Dawes, MD on 07/31/2020 @ 11:20 am.  [facility] f/u appt confirmed? Yes  Scheduled to see Star Age, MD on 08/05/2020 @ 1:00 pm.  Are transportation arrangements needed? No   If their condition worsens, is the pt aware to call PCP or go to the Emergency Dept.? Yes  Was the patient provided with contact information for the PCP's office or ED? Yes  Was to pt encouraged to call back with questions or concerns? Yes

## 2020-07-30 DIAGNOSIS — R2681 Unsteadiness on feet: Secondary | ICD-10-CM | POA: Diagnosis not present

## 2020-07-30 DIAGNOSIS — M6281 Muscle weakness (generalized): Secondary | ICD-10-CM | POA: Diagnosis not present

## 2020-07-30 DIAGNOSIS — M5451 Vertebrogenic low back pain: Secondary | ICD-10-CM | POA: Diagnosis not present

## 2020-07-30 DIAGNOSIS — R262 Difficulty in walking, not elsewhere classified: Secondary | ICD-10-CM | POA: Diagnosis not present

## 2020-07-31 ENCOUNTER — Ambulatory Visit (INDEPENDENT_AMBULATORY_CARE_PROVIDER_SITE_OTHER): Payer: Medicare Other | Admitting: Internal Medicine

## 2020-07-31 ENCOUNTER — Encounter: Payer: Self-pay | Admitting: Internal Medicine

## 2020-07-31 ENCOUNTER — Other Ambulatory Visit: Payer: Self-pay

## 2020-07-31 DIAGNOSIS — G8929 Other chronic pain: Secondary | ICD-10-CM

## 2020-07-31 DIAGNOSIS — L570 Actinic keratosis: Secondary | ICD-10-CM | POA: Diagnosis not present

## 2020-07-31 DIAGNOSIS — M549 Dorsalgia, unspecified: Secondary | ICD-10-CM | POA: Diagnosis not present

## 2020-07-31 DIAGNOSIS — M542 Cervicalgia: Secondary | ICD-10-CM | POA: Diagnosis not present

## 2020-07-31 DIAGNOSIS — R413 Other amnesia: Secondary | ICD-10-CM | POA: Diagnosis not present

## 2020-07-31 MED ORDER — DIAZEPAM 5 MG PO TABS: 5.0000 mg | ORAL_TABLET | Freq: Every day | ORAL | 1 refills | Status: DC | PRN

## 2020-07-31 MED ORDER — METHYLPREDNISOLONE 4 MG PO TBPK: ORAL_TABLET | ORAL | 0 refills | Status: DC

## 2020-07-31 NOTE — Assessment & Plan Note (Addendum)
See Cryo Skin bx if not gone in 2 mo

## 2020-07-31 NOTE — Progress Notes (Signed)
Subjective:  Patient ID: Bianca Haley, female    DOB: Sep 28, 1950  Age: 70 y.o. MRN: [id number]  CC: Hospitalization Follow-up (Pt is here to follow up from her ER visit. )   HPI Bianca Haley presents for cervical pain and R arm pain- on injections ([place] [facility]). Seeing Dr Bianca Haley - shots q 3 mo. Diazepam helps C/o skin lesion   Outpatient Medications Prior to Visit  Medication Sig Dispense Refill  . acetaminophen (TYLENOL) 650 MG CR tablet Take 1,300 mg by mouth every 8 (eight) hours as needed for pain.    [name] albuterol (VENTOLIN HFA) 108 (90 Base) MCG/ACT inhaler INHALE 2 PUFFS INTO THE LUNGS EVERY 4 HOURS AS NEEDED FOR WHEEZING OR SHORTNESS OF BREATH 18 g 2  . atenolol (TENORMIN) 100 MG tablet TAKE 1 TABLET(100 MG) BY MOUTH EVERY DAY (Patient taking differently: Take 100 mg by mouth daily.) 90 tablet 3  . Carboxymethylcellulose Sodium (RETAINE CMC OP) Place 1 application into both eyes at bedtime.    . Cholecalciferol (VITAMIN D) 2000 units tablet Take 2,000 Units by mouth at bedtime.     . diazepam (VALIUM) 5 MG tablet TAKE 1 TABLET BY MOUTH DAILY AS NEEDED FOR ANXIETY OR MUSCLE SPASMS (Patient taking differently: Take 5 mg by mouth daily as needed for anxiety or muscle spasms.) 30 tablet 0  . diltiazem (CARDIZEM CD) 180 MG 24 hr capsule TAKE 1 CAPSULE(180 MG) BY MOUTH DAILY 90 capsule 3  . fluticasone (FLONASE) 50 MCG/ACT nasal spray Place 2 sprays into both nostrils daily as needed for allergies or rhinitis.    [name] gabapentin (NEURONTIN) 300 MG capsule Take 1 capsule (300 mg total) by mouth 3 (three) times daily for 30 days. (Patient taking differently: Take 600-900 mg by mouth at bedtime.) 90 capsule 0  . levofloxacin (LEVAQUIN) 500 MG tablet Take 1 tablet (500 mg total) by mouth daily. 2 tablet 0  . Multiple Vitamin (MULTIVITAMIN WITH MINERALS) TABS tablet Take 2 tablets by mouth daily.     [name] oxymorphone (OPANA) 10 MG tablet Take 1 tablet (10 mg total) by mouth daily as needed  for pain. 1 tablet 0  . pantoprazole (PROTONIX) 40 MG tablet TAKE 1 TABLET(40 MG) BY MOUTH TWICE DAILY BEFORE A MEAL (Patient taking differently: Take 40 mg by mouth 2 (two) times daily before a meal.) 180 tablet 3  . PARoxetine (PAXIL) 40 MG tablet TAKE 1 TABLET(40 MG) BY MOUTH TWICE DAILY 180 tablet 3  . Polyethyl Glycol-Propyl Glycol (SYSTANE OP) Place 1 drop into both eyes daily as needed (dry eyes).    . polyethylene glycol (MIRALAX / GLYCOLAX) packet Take 17 g by mouth 2 (two) times daily. (Patient taking differently: Take 17 g by mouth daily as needed (constipation). Mix in 8 oz liquid and drink) 14 each 0  . potassium chloride (KLOR-CON) 10 MEQ tablet TAKE 1 TABLET(10 MEQ) BY MOUTH DAILY (Patient taking differently: Take 10 mEq by mouth daily.) 90 tablet 2  . Probiotic CAPS Take 1 capsule by mouth daily.    . rosuvastatin (CRESTOR) 5 MG tablet TAKE 1 TABLET(5 MG) BY MOUTH DAILY (Patient taking differently: Take 5 mg by mouth daily.) 90 tablet 3  . SUMAtriptan (IMITREX) 100 MG tablet TAKE 1 TABLET BY MOUTH DAILY AS NEEDED FOR MIGRAINE. MAY REPEAT IN 2 HOURS IF HEADACHE PERSISTS OR RECURS (Patient taking differently: Take 100 mg by mouth every 2 (two) hours as needed for migraine.) 12 tablet 0  . traZODone (  DESYREL) 100 MG tablet Take 1 tablet (100 mg total) by mouth at bedtime as needed for sleep. for sleep 15 tablet 0  . vitamin B-12 (CYANOCOBALAMIN) 500 MCG tablet Take 500 mcg by mouth daily.     No facility-administered medications prior to visit.    ROS: Review of Systems  Constitutional: Negative for activity change, appetite change, chills, fatigue and unexpected weight change.  HENT: Negative for congestion, mouth sores and sinus pressure.   Eyes: Negative for visual disturbance.  Respiratory: Negative for cough and chest tightness.   Gastrointestinal: Negative for abdominal pain and nausea.  Genitourinary: Negative for difficulty urinating, frequency and vaginal pain.   Musculoskeletal: Positive for neck pain and neck stiffness. Negative for back pain and gait problem.  Skin: Negative for pallor and rash.  Neurological: Negative for dizziness, tremors, weakness, numbness and headaches.  Psychiatric/Behavioral: Negative for confusion and sleep disturbance.    Objective:  BP 120/82 (BP Location: Left Arm, Patient Position: Sitting, Cuff Size: Normal)   Pulse 66   Temp 99.7 F (37.6 C) (Oral)   Ht 5\' 5"  (1.651 m)   Wt 153 lb (69.4 kg)   SpO2 97%   BMI 25.46 kg/m   BP Readings from Last 3 Encounters:  07/31/20 120/82  07/27/20 124/60  05/06/20 120/66    Wt Readings from Last 3 Encounters:  07/31/20 153 lb (69.4 kg)  07/26/20 143 lb 4.8 oz (65 kg)  05/06/20 158 lb 9.6 oz (71.9 kg)    Physical Exam Constitutional:      General: She is not in acute distress.    Appearance: She is well-developed.  HENT:     Head: Normocephalic.     Right Ear: External ear normal.     Left Ear: External ear normal.     Nose: Nose normal.  Eyes:     General:        Right eye: No discharge.        Left eye: No discharge.     Conjunctiva/sclera: Conjunctivae normal.     Pupils: Pupils are equal, round, and reactive to light.  Neck:     Thyroid: No thyromegaly.     Vascular: No JVD.     Trachea: No tracheal deviation.  Cardiovascular:     Rate and Rhythm: Normal rate and regular rhythm.     Heart sounds: Normal heart sounds.  Pulmonary:     Effort: No respiratory distress.     Breath sounds: No stridor. No wheezing.  Abdominal:     General: Bowel sounds are normal. There is no distension.     Palpations: Abdomen is soft. There is no mass.     Tenderness: There is no abdominal tenderness. There is no guarding or rebound.  Musculoskeletal:        General: Tenderness present.     Cervical back: Normal range of motion and neck supple.  Lymphadenopathy:     Cervical: No cervical adenopathy.  Skin:    Findings: No erythema or rash.  Neurological:      Cranial Nerves: No cranial nerve deficit.     Motor: No abnormal muscle tone.     Coordination: Coordination normal.     Deep Tendon Reflexes: Reflexes normal.  Psychiatric:        Behavior: Behavior normal.        Thought Content: Thought content normal.        Judgment: Judgment normal.       Procedure Note :  Procedure : Cryosurgery   Indication:  Actinic keratosis vs skin cancer   Risks including unsuccessful procedure , bleeding, infection, bruising, scar, a need for a repeat  procedure and others were explained to the patient in detail as well as the benefits. Informed consent was obtained verbally.   1  lesion(s)  on L wrist   was/were treated with liquid nitrogen on a Q-tip in a usual fasion . Band-Aid was applied and antibiotic ointment was given for a later use.   Tolerated well. Complications none.   Postprocedure instructions :     Keep the wounds clean. You can wash them with liquid soap and water. Pat dry with gauze or a Kleenex tissue  Before applying antibiotic ointment and a Band-Aid.   You need to report immediately  if  any signs of infection develop.    Lab Results  Component Value Date   WBC 6.5 07/26/2020   HGB 14.6 07/26/2020   HCT 44.9 07/26/2020   PLT 297 07/26/2020   GLUCOSE 147 (H) 07/27/2020   CHOL 155 04/03/2020   TRIG 102.0 04/03/2020   HDL 61.40 04/03/2020   LDLCALC 73 04/03/2020   ALT 12 07/26/2020   AST 27 07/26/2020   NA 137 07/27/2020   K 3.2 (L) 07/27/2020   CL 98 07/27/2020   CREATININE 0.81 07/27/2020   BUN 7 (L) 07/27/2020   CO2 29 07/27/2020   TSH 1.092 07/26/2020   INR 1.0 09/21/2018   HGBA1C 6.1 06/23/2018    CT Head Wo Contrast  Result Date: 07/25/2020 CLINICAL DATA:  Confusion/altered mental status EXAM: CT HEAD WITHOUT CONTRAST TECHNIQUE: Contiguous axial images were obtained from the base of the skull through the vertex without intravenous contrast. COMPARISON:  Sep 27, 2013 head CT; brain MRI December 28, 2017  FINDINGS: Brain: There is age related volume loss. There is no intracranial mass, hemorrhage, extra-axial fluid collection, or midline shift. There is slight decreased attenuation in the centra semiovale bilaterally. Elsewhere brain parenchyma appears unremarkable. No evident acute infarct. Vascular: No hyperdense vessel. No appreciable vascular calcification. Skull: The bony calvarium appears intact. Sinuses/Orbits: There is slight mucosal thickening in several ethmoid air cells. Other paranasal sinuses are clear. Orbits appear symmetric bilaterally. Other: Mastoid air cells are clear. IMPRESSION: Age related volume loss with slight periventricular small vessel disease. No acute infarct. No mass or hemorrhage. Mild mucosal thickening noted in several ethmoid air cells. Electronically Signed   By: Lowella Grip III M.D.   On: 07/25/2020 20:07   DG Chest Portable 1 View  Result Date: 07/25/2020 CLINICAL DATA:  Altered mental status EXAM: PORTABLE CHEST 1 VIEW COMPARISON:  07/12/2018 FINDINGS: Heart is upper limits normal in size. Lungs are clear. No effusions or edema. No acute bony abnormality. IMPRESSION: No active disease. Electronically Signed   By: Rolm Baptise M.D.   On: 07/25/2020 18:59    Assessment & Plan:   There are no diagnoses linked to this encounter.   No orders of the defined types were placed in this encounter.    Follow-up: No follow-ups on file.  Bianca Kehr, MD

## 2020-07-31 NOTE — Patient Instructions (Signed)
Check with Dr Davy Pique about cervical traction

## 2020-08-04 ENCOUNTER — Encounter: Payer: Self-pay | Admitting: Internal Medicine

## 2020-08-04 NOTE — Assessment & Plan Note (Signed)
Worse.  Follow-up with Dr. Davy Pique.  Should check with Dr. Davy Pique if she could use neck traction.  Diazepam as needed.  Medrol Dosepak if worse.  Potential benefits of a long term benzodiazepines  use as well as potential risks  and complications were explained to the patient and were aknowledged.

## 2020-08-04 NOTE — Assessment & Plan Note (Signed)
On [place]

## 2020-08-05 ENCOUNTER — Ambulatory Visit: Payer: Medicare Other | Admitting: Neurology

## 2020-08-06 DIAGNOSIS — R2681 Unsteadiness on feet: Secondary | ICD-10-CM | POA: Diagnosis not present

## 2020-08-06 DIAGNOSIS — M5451 Vertebrogenic low back pain: Secondary | ICD-10-CM | POA: Diagnosis not present

## 2020-08-06 DIAGNOSIS — R262 Difficulty in walking, not elsewhere classified: Secondary | ICD-10-CM | POA: Diagnosis not present

## 2020-08-06 DIAGNOSIS — M6281 Muscle weakness (generalized): Secondary | ICD-10-CM | POA: Diagnosis not present

## 2020-08-07 DIAGNOSIS — R3915 Urgency of urination: Secondary | ICD-10-CM | POA: Diagnosis not present

## 2020-08-08 DIAGNOSIS — R2681 Unsteadiness on feet: Secondary | ICD-10-CM | POA: Diagnosis not present

## 2020-08-08 DIAGNOSIS — M5451 Vertebrogenic low back pain: Secondary | ICD-10-CM | POA: Diagnosis not present

## 2020-08-08 DIAGNOSIS — R262 Difficulty in walking, not elsewhere classified: Secondary | ICD-10-CM | POA: Diagnosis not present

## 2020-08-08 DIAGNOSIS — M6281 Muscle weakness (generalized): Secondary | ICD-10-CM | POA: Diagnosis not present

## 2020-08-12 DIAGNOSIS — Z1159 Encounter for screening for other viral diseases: Secondary | ICD-10-CM | POA: Diagnosis not present

## 2020-08-12 DIAGNOSIS — Z20828 Contact with and (suspected) exposure to other viral communicable diseases: Secondary | ICD-10-CM | POA: Diagnosis not present

## 2020-08-13 ENCOUNTER — Telehealth: Payer: Self-pay | Admitting: *Deleted

## 2020-08-13 NOTE — Telephone Encounter (Signed)
Rec'd fax for PA on Paroxetine 40 mg. Completed vis cover-my-meds w/ Key: [id number]. Waiting on approval status.Marland KitchenJohny Haley

## 2020-08-14 DIAGNOSIS — M5451 Vertebrogenic low back pain: Secondary | ICD-10-CM | POA: Diagnosis not present

## 2020-08-14 DIAGNOSIS — M6281 Muscle weakness (generalized): Secondary | ICD-10-CM | POA: Diagnosis not present

## 2020-08-14 DIAGNOSIS — R2681 Unsteadiness on feet: Secondary | ICD-10-CM | POA: Diagnosis not present

## 2020-08-14 DIAGNOSIS — R262 Difficulty in walking, not elsewhere classified: Secondary | ICD-10-CM | POA: Diagnosis not present

## 2020-08-14 NOTE — Telephone Encounter (Signed)
Check cover-my-meds rec'd msg astating " Approved today Effective from 08/13/2020 through 08/13/2021". Faxed approval to pof.Marland KitchenJohny Chess

## 2020-08-16 DIAGNOSIS — M6281 Muscle weakness (generalized): Secondary | ICD-10-CM | POA: Diagnosis not present

## 2020-08-16 DIAGNOSIS — R262 Difficulty in walking, not elsewhere classified: Secondary | ICD-10-CM | POA: Diagnosis not present

## 2020-08-16 DIAGNOSIS — R2681 Unsteadiness on feet: Secondary | ICD-10-CM | POA: Diagnosis not present

## 2020-08-16 DIAGNOSIS — M5451 Vertebrogenic low back pain: Secondary | ICD-10-CM | POA: Diagnosis not present

## 2020-08-19 DIAGNOSIS — Z20828 Contact with and (suspected) exposure to other viral communicable diseases: Secondary | ICD-10-CM | POA: Diagnosis not present

## 2020-08-19 DIAGNOSIS — Z1159 Encounter for screening for other viral diseases: Secondary | ICD-10-CM | POA: Diagnosis not present

## 2020-08-19 DIAGNOSIS — R262 Difficulty in walking, not elsewhere classified: Secondary | ICD-10-CM | POA: Diagnosis not present

## 2020-08-19 DIAGNOSIS — M6281 Muscle weakness (generalized): Secondary | ICD-10-CM | POA: Diagnosis not present

## 2020-08-19 DIAGNOSIS — M5451 Vertebrogenic low back pain: Secondary | ICD-10-CM | POA: Diagnosis not present

## 2020-08-19 DIAGNOSIS — R2681 Unsteadiness on feet: Secondary | ICD-10-CM | POA: Diagnosis not present

## 2020-08-21 DIAGNOSIS — R262 Difficulty in walking, not elsewhere classified: Secondary | ICD-10-CM | POA: Diagnosis not present

## 2020-08-21 DIAGNOSIS — M6281 Muscle weakness (generalized): Secondary | ICD-10-CM | POA: Diagnosis not present

## 2020-08-21 DIAGNOSIS — M5451 Vertebrogenic low back pain: Secondary | ICD-10-CM | POA: Diagnosis not present

## 2020-08-21 DIAGNOSIS — R2681 Unsteadiness on feet: Secondary | ICD-10-CM | POA: Diagnosis not present

## 2020-08-22 DIAGNOSIS — R3915 Urgency of urination: Secondary | ICD-10-CM | POA: Diagnosis not present

## 2020-08-22 DIAGNOSIS — Z6824 Body mass index (BMI) 24.0-24.9, adult: Secondary | ICD-10-CM | POA: Diagnosis not present

## 2020-08-22 DIAGNOSIS — Z1231 Encounter for screening mammogram for malignant neoplasm of breast: Secondary | ICD-10-CM | POA: Diagnosis not present

## 2020-08-22 DIAGNOSIS — Z01419 Encounter for gynecological examination (general) (routine) without abnormal findings: Secondary | ICD-10-CM | POA: Diagnosis not present

## 2020-08-28 DIAGNOSIS — M6281 Muscle weakness (generalized): Secondary | ICD-10-CM | POA: Diagnosis not present

## 2020-08-28 DIAGNOSIS — R262 Difficulty in walking, not elsewhere classified: Secondary | ICD-10-CM | POA: Diagnosis not present

## 2020-08-28 DIAGNOSIS — M5451 Vertebrogenic low back pain: Secondary | ICD-10-CM | POA: Diagnosis not present

## 2020-08-28 DIAGNOSIS — R2681 Unsteadiness on feet: Secondary | ICD-10-CM | POA: Diagnosis not present

## 2020-08-30 ENCOUNTER — Other Ambulatory Visit: Payer: Self-pay | Admitting: Internal Medicine

## 2020-08-30 DIAGNOSIS — M6281 Muscle weakness (generalized): Secondary | ICD-10-CM | POA: Diagnosis not present

## 2020-08-30 DIAGNOSIS — M5451 Vertebrogenic low back pain: Secondary | ICD-10-CM | POA: Diagnosis not present

## 2020-08-30 DIAGNOSIS — R262 Difficulty in walking, not elsewhere classified: Secondary | ICD-10-CM | POA: Diagnosis not present

## 2020-08-30 DIAGNOSIS — R2681 Unsteadiness on feet: Secondary | ICD-10-CM | POA: Diagnosis not present

## 2020-09-05 DIAGNOSIS — M25562 Pain in left knee: Secondary | ICD-10-CM | POA: Diagnosis not present

## 2020-09-05 DIAGNOSIS — R262 Difficulty in walking, not elsewhere classified: Secondary | ICD-10-CM | POA: Diagnosis not present

## 2020-09-05 DIAGNOSIS — R2681 Unsteadiness on feet: Secondary | ICD-10-CM | POA: Diagnosis not present

## 2020-09-05 DIAGNOSIS — M5451 Vertebrogenic low back pain: Secondary | ICD-10-CM | POA: Diagnosis not present

## 2020-09-09 DIAGNOSIS — Z20828 Contact with and (suspected) exposure to other viral communicable diseases: Secondary | ICD-10-CM | POA: Diagnosis not present

## 2020-09-09 DIAGNOSIS — Z1159 Encounter for screening for other viral diseases: Secondary | ICD-10-CM | POA: Diagnosis not present

## 2020-09-16 DIAGNOSIS — Z20828 Contact with and (suspected) exposure to other viral communicable diseases: Secondary | ICD-10-CM | POA: Diagnosis not present

## 2020-09-16 DIAGNOSIS — Z1159 Encounter for screening for other viral diseases: Secondary | ICD-10-CM | POA: Diagnosis not present

## 2020-09-20 DIAGNOSIS — R2681 Unsteadiness on feet: Secondary | ICD-10-CM | POA: Diagnosis not present

## 2020-09-20 DIAGNOSIS — M25562 Pain in left knee: Secondary | ICD-10-CM | POA: Diagnosis not present

## 2020-09-20 DIAGNOSIS — R262 Difficulty in walking, not elsewhere classified: Secondary | ICD-10-CM | POA: Diagnosis not present

## 2020-09-20 DIAGNOSIS — M5451 Vertebrogenic low back pain: Secondary | ICD-10-CM | POA: Diagnosis not present

## 2020-09-23 DIAGNOSIS — M5136 Other intervertebral disc degeneration, lumbar region: Secondary | ICD-10-CM | POA: Diagnosis not present

## 2020-09-23 DIAGNOSIS — Z981 Arthrodesis status: Secondary | ICD-10-CM | POA: Diagnosis not present

## 2020-09-23 DIAGNOSIS — M25561 Pain in right knee: Secondary | ICD-10-CM | POA: Diagnosis not present

## 2020-09-23 DIAGNOSIS — M542 Cervicalgia: Secondary | ICD-10-CM | POA: Diagnosis not present

## 2020-09-24 ENCOUNTER — Other Ambulatory Visit: Payer: Self-pay | Admitting: Neurosurgery

## 2020-09-24 DIAGNOSIS — M542 Cervicalgia: Secondary | ICD-10-CM

## 2020-09-25 ENCOUNTER — Telehealth: Payer: Self-pay

## 2020-09-25 ENCOUNTER — Other Ambulatory Visit: Payer: Self-pay | Admitting: Neurosurgery

## 2020-09-25 DIAGNOSIS — M542 Cervicalgia: Secondary | ICD-10-CM

## 2020-09-25 DIAGNOSIS — Z981 Arthrodesis status: Secondary | ICD-10-CM

## 2020-09-25 NOTE — Progress Notes (Signed)
Phone call to patient to verify medication list and allergies for myelogram procedure. Pt instructed to hold Paxil, Imitrex, and Trazodone for 48hrs prior to myelogram appointment time and 24 hours after appointment. Pt also instructed to have a driver the day of the procedure, the procedure would take around 2 hours, and discharge instructions discussed. Pt verbalized understanding.

## 2020-09-27 DIAGNOSIS — R2681 Unsteadiness on feet: Secondary | ICD-10-CM | POA: Diagnosis not present

## 2020-09-27 DIAGNOSIS — M5451 Vertebrogenic low back pain: Secondary | ICD-10-CM | POA: Diagnosis not present

## 2020-09-27 DIAGNOSIS — R262 Difficulty in walking, not elsewhere classified: Secondary | ICD-10-CM | POA: Diagnosis not present

## 2020-09-27 DIAGNOSIS — M25562 Pain in left knee: Secondary | ICD-10-CM | POA: Diagnosis not present

## 2020-10-01 DIAGNOSIS — R350 Frequency of micturition: Secondary | ICD-10-CM | POA: Diagnosis not present

## 2020-10-01 DIAGNOSIS — N3946 Mixed incontinence: Secondary | ICD-10-CM | POA: Diagnosis not present

## 2020-10-01 DIAGNOSIS — R351 Nocturia: Secondary | ICD-10-CM | POA: Diagnosis not present

## 2020-10-02 ENCOUNTER — Other Ambulatory Visit: Payer: Self-pay

## 2020-10-02 ENCOUNTER — Encounter: Payer: Self-pay | Admitting: Internal Medicine

## 2020-10-02 ENCOUNTER — Ambulatory Visit (INDEPENDENT_AMBULATORY_CARE_PROVIDER_SITE_OTHER): Payer: Medicare Other | Admitting: Internal Medicine

## 2020-10-02 DIAGNOSIS — G8929 Other chronic pain: Secondary | ICD-10-CM

## 2020-10-02 DIAGNOSIS — M542 Cervicalgia: Secondary | ICD-10-CM | POA: Diagnosis not present

## 2020-10-02 DIAGNOSIS — Z8701 Personal history of pneumonia (recurrent): Secondary | ICD-10-CM | POA: Diagnosis not present

## 2020-10-02 DIAGNOSIS — M25562 Pain in left knee: Secondary | ICD-10-CM | POA: Diagnosis not present

## 2020-10-02 DIAGNOSIS — K219 Gastro-esophageal reflux disease without esophagitis: Secondary | ICD-10-CM

## 2020-10-02 DIAGNOSIS — L570 Actinic keratosis: Secondary | ICD-10-CM | POA: Diagnosis not present

## 2020-10-02 DIAGNOSIS — N183 Chronic kidney disease, stage 3 unspecified: Secondary | ICD-10-CM

## 2020-10-02 DIAGNOSIS — M549 Dorsalgia, unspecified: Secondary | ICD-10-CM | POA: Diagnosis not present

## 2020-10-02 DIAGNOSIS — I700 Atherosclerosis of aorta: Secondary | ICD-10-CM | POA: Diagnosis not present

## 2020-10-02 DIAGNOSIS — F419 Anxiety disorder, unspecified: Secondary | ICD-10-CM

## 2020-10-02 DIAGNOSIS — N2889 Other specified disorders of kidney and ureter: Secondary | ICD-10-CM

## 2020-10-02 DIAGNOSIS — I10 Essential (primary) hypertension: Secondary | ICD-10-CM | POA: Diagnosis not present

## 2020-10-02 DIAGNOSIS — J189 Pneumonia, unspecified organism: Secondary | ICD-10-CM

## 2020-10-02 MED ORDER — LIDOCAINE 5 % EX OINT: 1.0000 "application " | TOPICAL_OINTMENT | Freq: Three times a day (TID) | CUTANEOUS | 1 refills | Status: DC | PRN

## 2020-10-02 MED ORDER — DIAZEPAM 5 MG PO TABS: 5.0000 mg | ORAL_TABLET | Freq: Every day | ORAL | 2 refills | Status: DC | PRN

## 2020-10-02 NOTE — Assessment & Plan Note (Signed)
Stable. Cont w/Atenolol, Diltiazem,  Maxzide 

## 2020-10-02 NOTE — Assessment & Plan Note (Addendum)
2022: GFR 37-60 Cont w/good hydration Monitor GFR

## 2020-10-02 NOTE — Assessment & Plan Note (Signed)
Worse Try Lido oint

## 2020-10-02 NOTE — Assessment & Plan Note (Signed)
Stable. Cont w/bed head elevation and continue Protonix bid

## 2020-10-02 NOTE — Assessment & Plan Note (Addendum)
Stable Cont w/Paxil

## 2020-10-02 NOTE — Assessment & Plan Note (Signed)
L arm - cryo 

## 2020-10-02 NOTE — Assessment & Plan Note (Signed)
Worse Medrol pack has helped. CT - pending Diazepam prn

## 2020-10-02 NOTE — Assessment & Plan Note (Signed)
No relapse 

## 2020-10-02 NOTE — Progress Notes (Addendum)
Subjective:  Patient ID: Bianca Haley, female    DOB: 1950-08-16  Age: 70 y.o. MRN: [id number]  CC: Follow-up (6 month f/u- Pt states she has a mole on (L) arm she want MD to evaluate)   HPI Bianca Haley presents for HTN, anxiety, anxiety  F/u cervical pain, LBP (worse) - CTs are pending C/o more stress w/Steve - cancer is back  Outpatient Medications Prior to Visit  Medication Sig Dispense Refill   acetaminophen (TYLENOL) 650 MG CR tablet Take 1,300 mg by mouth every 8 (eight) hours as needed for pain.     albuterol (VENTOLIN HFA) 108 (90 Base) MCG/ACT inhaler INHALE 2 PUFFS INTO THE LUNGS EVERY 4 HOURS AS NEEDED FOR WHEEZING OR SHORTNESS OF BREATH 18 g 2   atenolol (TENORMIN) 100 MG tablet TAKE 1 TABLET(100 MG) BY MOUTH EVERY DAY (Patient taking differently: Take 100 mg by mouth daily.) 90 tablet 3   Carboxymethylcellulose Sodium (RETAINE CMC OP) Place 1 application into both eyes at bedtime.     Cholecalciferol (VITAMIN D) 2000 units tablet Take 2,000 Units by mouth at bedtime.      diazepam (VALIUM) 5 MG tablet Take 1 tablet (5 mg total) by mouth daily as needed for anxiety or muscle spasms. 30 tablet 1   diltiazem (CARDIZEM CD) 180 MG 24 hr capsule TAKE 1 CAPSULE(180 MG) BY MOUTH DAILY 90 capsule 3   fluticasone (FLONASE) 50 MCG/ACT nasal spray Place 2 sprays into both nostrils daily as needed for allergies or rhinitis.     Multiple Vitamin (MULTIVITAMIN WITH MINERALS) TABS tablet Take 2 tablets by mouth daily.      oxymorphone (OPANA) 10 MG tablet Take 1 tablet (10 mg total) by mouth daily as needed for pain. 1 tablet 0   pantoprazole (PROTONIX) 40 MG tablet TAKE 1 TABLET(40 MG) BY MOUTH TWICE DAILY BEFORE A MEAL (Patient taking differently: Take 40 mg by mouth 2 (two) times daily before a meal.) 180 tablet 3   PARoxetine (PAXIL) 40 MG tablet TAKE 1 TABLET(40 MG) BY MOUTH TWICE DAILY 180 tablet 3   Polyethyl Glycol-Propyl Glycol (SYSTANE OP) Place 1 drop into both eyes daily as  needed (dry eyes).     polyethylene glycol (MIRALAX / GLYCOLAX) packet Take 17 g by mouth 2 (two) times daily. (Patient taking differently: Take 17 g by mouth daily as needed (constipation). Mix in 8 oz liquid and drink) 14 each 0   potassium chloride (KLOR-CON) 10 MEQ tablet TAKE 1 TABLET(10 MEQ) BY MOUTH DAILY (Patient taking differently: Take 10 mEq by mouth daily.) 90 tablet 2   Probiotic CAPS Take 1 capsule by mouth daily.     rosuvastatin (CRESTOR) 5 MG tablet TAKE 1 TABLET(5 MG) BY MOUTH DAILY 90 tablet 3   SUMAtriptan (IMITREX) 100 MG tablet TAKE 1 TABLET BY MOUTH DAILY AS NEEDED FOR MIGRAINE. MAY REPEAT IN 2 HOURS IF HEADACHE PERSISTS OR RECURS 12 tablet 0   traZODone (DESYREL) 100 MG tablet Take 1 tablet (100 mg total) by mouth at bedtime as needed for sleep. for sleep 15 tablet 0   vitamin B-12 (CYANOCOBALAMIN) 500 MCG tablet Take 500 mcg by mouth daily.     gabapentin (NEURONTIN) 300 MG capsule Take 1 capsule (300 mg total) by mouth 3 (three) times daily for 30 days. (Patient taking differently: Take 600-900 mg by mouth at bedtime.) 90 capsule 0   levofloxacin (LEVAQUIN) 500 MG tablet Take 1 tablet (500 mg total) by mouth daily. (Patient not  taking: No sig reported) 2 tablet 0   methylPREDNISolone (MEDROL DOSEPAK) 4 MG TBPK tablet As directed (Patient not taking: No sig reported) 21 tablet 0   No facility-administered medications prior to visit.    ROS: Review of Systems  Constitutional: Negative for activity change, appetite change, chills, fatigue and unexpected weight change.  HENT: Negative for congestion, mouth sores and sinus pressure.   Eyes: Negative for visual disturbance.  Respiratory: Negative for cough and chest tightness.   Gastrointestinal: Negative for abdominal pain and nausea.  Genitourinary: Negative for difficulty urinating, frequency and vaginal pain.  Musculoskeletal: Positive for arthralgias, back pain, neck pain and neck stiffness. Negative for gait problem.   Skin: Negative for pallor and rash.  Neurological: Negative for dizziness, tremors, weakness, numbness and headaches.  Psychiatric/Behavioral: Negative for confusion and sleep disturbance.    Objective:  BP 132/72 (BP Location: Left Arm)   Pulse (!) 58   Temp 98.4 F (36.9 C) (Oral)   Ht 5\' 5"  (1.651 m)   Wt 149 lb 12.8 oz (67.9 kg)   SpO2 95%   BMI 24.93 kg/m   BP Readings from Last 3 Encounters:  10/02/20 132/72  07/31/20 120/82  07/27/20 124/60    Wt Readings from Last 3 Encounters:  10/02/20 149 lb 12.8 oz (67.9 kg)  07/31/20 153 lb (69.4 kg)  07/26/20 143 lb 4.8 oz (65 kg)    Physical Exam Constitutional:      General: She is not in acute distress.    Appearance: She is well-developed.  HENT:     Head: Normocephalic.     Right Ear: External ear normal.     Left Ear: External ear normal.     Nose: Nose normal.  Eyes:     General:        Right eye: No discharge.        Left eye: No discharge.     Conjunctiva/sclera: Conjunctivae normal.     Pupils: Pupils are equal, round, and reactive to light.  Neck:     Thyroid: No thyromegaly.     Vascular: No JVD.     Trachea: No tracheal deviation.  Cardiovascular:     Rate and Rhythm: Normal rate and regular rhythm.     Heart sounds: Normal heart sounds.  Pulmonary:     Effort: No respiratory distress.     Breath sounds: No stridor. No wheezing.  Abdominal:     General: Bowel sounds are normal. There is no distension.     Palpations: Abdomen is soft. There is no mass.     Tenderness: There is no abdominal tenderness. There is no guarding or rebound.  Musculoskeletal:        General: No tenderness.     Cervical back: Normal range of motion and neck supple.  Lymphadenopathy:     Cervical: No cervical adenopathy.  Skin:    Findings: No erythema or rash.  Neurological:     Mental Status: She is oriented to person, place, and time.     Cranial Nerves: No cranial nerve deficit.     Motor: No abnormal muscle  tone.     Coordination: Coordination normal.     Deep Tendon Reflexes: Reflexes normal.  Psychiatric:        Behavior: Behavior normal.        Thought Content: Thought content normal.        Judgment: Judgment normal.   C spine, LS spine - painful Larm AK   Procedure  Note :     Procedure : Cryosurgery   Indication:  Actinic keratosis(es)   Risks including unsuccessful procedure , bleeding, infection, bruising, scar, a need for a repeat  procedure and others were explained to the patient in detail as well as the benefits. Informed consent was obtained verbally.    1 lesion(s)  on L arm was/were treated with liquid nitrogen on a Q-tip in a usual fasion . Band-Aid was applied and antibiotic ointment was given for a later use.   Tolerated well. Complications none.   Postprocedure instructions :     Keep the wounds clean. You can wash them with liquid soap and water. Pat dry with gauze or a Kleenex tissue  Before applying antibiotic ointment and a Band-Aid.   You need to report immediately  if  any signs of infection develop.    Lab Results  Component Value Date   WBC 6.5 07/26/2020   HGB 14.6 07/26/2020   HCT 44.9 07/26/2020   PLT 297 07/26/2020   GLUCOSE 147 (H) 07/27/2020   CHOL 155 04/03/2020   TRIG 102.0 04/03/2020   HDL 61.40 04/03/2020   LDLCALC 73 04/03/2020   ALT 12 07/26/2020   AST 27 07/26/2020   NA 137 07/27/2020   K 3.2 (L) 07/27/2020   CL 98 07/27/2020   CREATININE 0.81 07/27/2020   BUN 7 (L) 07/27/2020   CO2 29 07/27/2020   TSH 1.092 07/26/2020   INR 1.0 09/21/2018   HGBA1C 6.1 06/23/2018    CT Head Wo Contrast  Result Date: 07/25/2020 CLINICAL DATA:  Confusion/altered mental status EXAM: CT HEAD WITHOUT CONTRAST TECHNIQUE: Contiguous axial images were obtained from the base of the skull through the vertex without intravenous contrast. COMPARISON:  Sep 27, 2013 head CT; brain MRI December 28, 2017 FINDINGS: Brain: There is age related volume loss. There  is no intracranial mass, hemorrhage, extra-axial fluid collection, or midline shift. There is slight decreased attenuation in the centra semiovale bilaterally. Elsewhere brain parenchyma appears unremarkable. No evident acute infarct. Vascular: No hyperdense vessel. No appreciable vascular calcification. Skull: The bony calvarium appears intact. Sinuses/Orbits: There is slight mucosal thickening in several ethmoid air cells. Other paranasal sinuses are clear. Orbits appear symmetric bilaterally. Other: Mastoid air cells are clear. IMPRESSION: Age related volume loss with slight periventricular small vessel disease. No acute infarct. No mass or hemorrhage. Mild mucosal thickening noted in several ethmoid air cells. Electronically Signed   By: Lowella Grip III M.D.   On: 07/25/2020 20:07   DG Chest Portable 1 View  Result Date: 07/25/2020 CLINICAL DATA:  Altered mental status EXAM: PORTABLE CHEST 1 VIEW COMPARISON:  07/12/2018 FINDINGS: Heart is upper limits normal in size. Lungs are clear. No effusions or edema. No acute bony abnormality. IMPRESSION: No active disease. Electronically Signed   By: Rolm Baptise M.D.   On: 07/25/2020 18:59    Assessment & Plan:     Follow-up: No follow-ups on file.  [name], MD

## 2020-10-02 NOTE — Assessment & Plan Note (Signed)
On diet, exercise 

## 2020-10-03 ENCOUNTER — Ambulatory Visit
Admission: RE | Admit: 2020-10-03 | Discharge: 2020-10-03 | Disposition: A | Discharge status: Home / Self Care | Payer: Medicare Other | Source: Ambulatory Visit | Attending: Neurosurgery | Admitting: Neurosurgery

## 2020-10-03 DIAGNOSIS — M47816 Spondylosis without myelopathy or radiculopathy, lumbar region: Secondary | ICD-10-CM | POA: Diagnosis not present

## 2020-10-03 DIAGNOSIS — M5126 Other intervertebral disc displacement, lumbar region: Secondary | ICD-10-CM | POA: Diagnosis not present

## 2020-10-03 DIAGNOSIS — M4326 Fusion of spine, lumbar region: Secondary | ICD-10-CM | POA: Diagnosis not present

## 2020-10-03 DIAGNOSIS — M542 Cervicalgia: Secondary | ICD-10-CM

## 2020-10-03 DIAGNOSIS — M5136 Other intervertebral disc degeneration, lumbar region: Secondary | ICD-10-CM | POA: Diagnosis not present

## 2020-10-03 DIAGNOSIS — Z981 Arthrodesis status: Secondary | ICD-10-CM

## 2020-10-03 DIAGNOSIS — M50221 Other cervical disc displacement at C4-C5 level: Secondary | ICD-10-CM | POA: Diagnosis not present

## 2020-10-03 DIAGNOSIS — M5031 Other cervical disc degeneration,  high cervical region: Secondary | ICD-10-CM | POA: Diagnosis not present

## 2020-10-03 MED ORDER — DIAZEPAM 5 MG PO TABS
5.0000 mg | ORAL_TABLET | Freq: Once | ORAL | Status: AC
  Administered 2020-10-03: 5 mg via ORAL

## 2020-10-03 MED ORDER — IOPAMIDOL (ISOVUE-M 300) INJECTION 61%
10.0000 mL | Freq: Once | INTRAMUSCULAR | Status: AC
  Administered 2020-10-03: 10 mL via INTRATHECAL

## 2020-10-03 NOTE — Discharge Instructions (Signed)
Myelogram Discharge Instructions  1. Go home and rest quietly as needed. You may resume normal activities; however, do not exert yourself strongly or do any heavy lifting today and tomorrow.   2. DO NOT drive today.    3. You may resume your normal diet and medications unless otherwise indicated. Drink lots of extra fluids today and tomorrow.   4. The incidence of headache, nausea, or vomiting is about 5% (one in 20 patients).  If you develop a headache, lie flat for 24 hours and drink plenty of fluids until the headache goes away.  Caffeinated beverages may be helpful. If when you get up you still have a headache when standing, go back to bed and force fluids for another 24 hours.   5. If you develop severe nausea and vomiting or a headache that does not go away with the flat bedrest after 48 hours, please call [phone].   6. Call your physician for a follow-up appointment.  The results of your myelogram will be sent directly to your physician by the following day.  7. If you have any questions or if complications develop after you arrive home, please call [phone].  Discharge instructions have been explained to the patient.  The patient, or the person responsible for the patient, fully understands these instructions.   Thank you for visiting our office today.   YOU MAY RESUME YOUR PAXIL, IMITREX, AND TRAZODONE TOMORROW 10/04/20 AT 9:30 AM

## 2020-10-07 ENCOUNTER — Telehealth: Payer: Self-pay | Admitting: Internal Medicine

## 2020-10-07 NOTE — Telephone Encounter (Signed)
Patients husband called and said that the patient had a CT on 6/2. He said that the patient does not seem like her self. He said that she is not sleeping her normal hours, he said that she is hot and then cold. He said that she seems delirious. Patient told her husband not to call EMS. He said that she is fearful and does not want to be alone.  Patients husband is requesting a call back and can be reached at [phone]. Please advise

## 2020-10-09 NOTE — Telephone Encounter (Signed)
Please take Bianca Haley to ER for the described symptoms.  She needs to be evaluated to rule out infection, etc.  Thanks

## 2020-10-09 NOTE — Telephone Encounter (Signed)
Called husband to advise, no answer and VM was full. Unable to leave message, will call back.

## 2020-10-11 DIAGNOSIS — R262 Difficulty in walking, not elsewhere classified: Secondary | ICD-10-CM | POA: Diagnosis not present

## 2020-10-11 DIAGNOSIS — M25562 Pain in left knee: Secondary | ICD-10-CM | POA: Diagnosis not present

## 2020-10-11 DIAGNOSIS — R2681 Unsteadiness on feet: Secondary | ICD-10-CM | POA: Diagnosis not present

## 2020-10-11 DIAGNOSIS — M5451 Vertebrogenic low back pain: Secondary | ICD-10-CM | POA: Diagnosis not present

## 2020-10-14 DIAGNOSIS — Z1159 Encounter for screening for other viral diseases: Secondary | ICD-10-CM | POA: Diagnosis not present

## 2020-10-14 DIAGNOSIS — Z20828 Contact with and (suspected) exposure to other viral communicable diseases: Secondary | ICD-10-CM | POA: Diagnosis not present

## 2020-10-16 DIAGNOSIS — R262 Difficulty in walking, not elsewhere classified: Secondary | ICD-10-CM | POA: Diagnosis not present

## 2020-10-16 DIAGNOSIS — R2681 Unsteadiness on feet: Secondary | ICD-10-CM | POA: Diagnosis not present

## 2020-10-16 DIAGNOSIS — M25562 Pain in left knee: Secondary | ICD-10-CM | POA: Diagnosis not present

## 2020-10-16 DIAGNOSIS — M5451 Vertebrogenic low back pain: Secondary | ICD-10-CM | POA: Diagnosis not present

## 2020-10-19 ENCOUNTER — Other Ambulatory Visit: Payer: Self-pay | Admitting: Internal Medicine

## 2020-10-21 DIAGNOSIS — Z1159 Encounter for screening for other viral diseases: Secondary | ICD-10-CM | POA: Diagnosis not present

## 2020-10-21 DIAGNOSIS — Z20828 Contact with and (suspected) exposure to other viral communicable diseases: Secondary | ICD-10-CM | POA: Diagnosis not present

## 2020-10-23 DIAGNOSIS — M25562 Pain in left knee: Secondary | ICD-10-CM | POA: Diagnosis not present

## 2020-10-23 DIAGNOSIS — R262 Difficulty in walking, not elsewhere classified: Secondary | ICD-10-CM | POA: Diagnosis not present

## 2020-10-23 DIAGNOSIS — M5451 Vertebrogenic low back pain: Secondary | ICD-10-CM | POA: Diagnosis not present

## 2020-10-23 DIAGNOSIS — R2681 Unsteadiness on feet: Secondary | ICD-10-CM | POA: Diagnosis not present

## 2020-10-28 DIAGNOSIS — Z20828 Contact with and (suspected) exposure to other viral communicable diseases: Secondary | ICD-10-CM | POA: Diagnosis not present

## 2020-10-28 DIAGNOSIS — Z1159 Encounter for screening for other viral diseases: Secondary | ICD-10-CM | POA: Diagnosis not present

## 2020-10-30 ENCOUNTER — Telehealth: Payer: Self-pay | Admitting: Internal Medicine

## 2020-10-30 DIAGNOSIS — M542 Cervicalgia: Secondary | ICD-10-CM

## 2020-10-30 DIAGNOSIS — M25562 Pain in left knee: Secondary | ICD-10-CM | POA: Diagnosis not present

## 2020-10-30 DIAGNOSIS — M5451 Vertebrogenic low back pain: Secondary | ICD-10-CM | POA: Diagnosis not present

## 2020-10-30 DIAGNOSIS — R262 Difficulty in walking, not elsewhere classified: Secondary | ICD-10-CM | POA: Diagnosis not present

## 2020-10-30 DIAGNOSIS — R2681 Unsteadiness on feet: Secondary | ICD-10-CM | POA: Diagnosis not present

## 2020-10-30 NOTE — Telephone Encounter (Signed)
   Patient is requesting a referral be sent to [facility] for physical therapy for neck pain. Please advise   Phone: [phone] Fax: [phone]

## 2020-11-01 NOTE — Telephone Encounter (Signed)
Notified pt MD has placed the referral../lmb 

## 2020-11-01 NOTE — Telephone Encounter (Signed)
Ok Thx 

## 2020-11-04 DIAGNOSIS — Z1159 Encounter for screening for other viral diseases: Secondary | ICD-10-CM | POA: Diagnosis not present

## 2020-11-04 DIAGNOSIS — Z20828 Contact with and (suspected) exposure to other viral communicable diseases: Secondary | ICD-10-CM | POA: Diagnosis not present

## 2020-11-06 DIAGNOSIS — R350 Frequency of micturition: Secondary | ICD-10-CM | POA: Diagnosis not present

## 2020-11-06 DIAGNOSIS — N3946 Mixed incontinence: Secondary | ICD-10-CM | POA: Diagnosis not present

## 2020-11-18 DIAGNOSIS — Z20828 Contact with and (suspected) exposure to other viral communicable diseases: Secondary | ICD-10-CM | POA: Diagnosis not present

## 2020-11-18 DIAGNOSIS — Z1159 Encounter for screening for other viral diseases: Secondary | ICD-10-CM | POA: Diagnosis not present

## 2020-11-21 DIAGNOSIS — M5412 Radiculopathy, cervical region: Secondary | ICD-10-CM | POA: Diagnosis not present

## 2020-11-21 DIAGNOSIS — M5136 Other intervertebral disc degeneration, lumbar region: Secondary | ICD-10-CM | POA: Diagnosis not present

## 2020-11-21 DIAGNOSIS — Z981 Arthrodesis status: Secondary | ICD-10-CM | POA: Diagnosis not present

## 2020-11-21 DIAGNOSIS — M542 Cervicalgia: Secondary | ICD-10-CM | POA: Diagnosis not present

## 2020-11-25 ENCOUNTER — Telehealth: Payer: Self-pay | Admitting: Pulmonary Disease

## 2020-11-25 DIAGNOSIS — G4734 Idiopathic sleep related nonobstructive alveolar hypoventilation: Secondary | ICD-10-CM

## 2020-11-25 DIAGNOSIS — Z20828 Contact with and (suspected) exposure to other viral communicable diseases: Secondary | ICD-10-CM | POA: Diagnosis not present

## 2020-11-25 DIAGNOSIS — Z1159 Encounter for screening for other viral diseases: Secondary | ICD-10-CM | POA: Diagnosis not present

## 2020-11-25 NOTE — Telephone Encounter (Signed)
Order placed for nighttime oxygen.

## 2020-11-25 NOTE — Addendum Note (Signed)
Addended by: Amado Coe on: 11/25/2020 04:48 PM   Modules accepted: Orders

## 2020-11-27 DIAGNOSIS — M542 Cervicalgia: Secondary | ICD-10-CM | POA: Diagnosis not present

## 2020-12-02 DIAGNOSIS — Z20828 Contact with and (suspected) exposure to other viral communicable diseases: Secondary | ICD-10-CM | POA: Diagnosis not present

## 2020-12-02 DIAGNOSIS — Z1159 Encounter for screening for other viral diseases: Secondary | ICD-10-CM | POA: Diagnosis not present

## 2020-12-03 DIAGNOSIS — M542 Cervicalgia: Secondary | ICD-10-CM | POA: Diagnosis not present

## 2020-12-05 DIAGNOSIS — M542 Cervicalgia: Secondary | ICD-10-CM | POA: Diagnosis not present

## 2020-12-09 DIAGNOSIS — Z23 Encounter for immunization: Secondary | ICD-10-CM | POA: Diagnosis not present

## 2020-12-10 DIAGNOSIS — M542 Cervicalgia: Secondary | ICD-10-CM | POA: Diagnosis not present

## 2020-12-12 DIAGNOSIS — M542 Cervicalgia: Secondary | ICD-10-CM | POA: Diagnosis not present

## 2020-12-13 ENCOUNTER — Telehealth: Payer: Self-pay | Admitting: Internal Medicine

## 2020-12-13 NOTE — Chronic Care Management (AMB) (Signed)
  Chronic Care Management   Note  12/13/2020 Name: Bianca Haley MRN: [id number] DOB: 02/20/1951  [name] is a 70 y.o. year old female who is a primary care patient of Plotnikov, [name], MD. I reached out to [name] by phone today in response to a referral sent by Ms. Weston Brass Leng's PCP, Plotnikov, [name], MD.   Ms. Bretz was given information about Chronic Care Management services today including:  CCM service includes personalized support from designated clinical staff supervised by her physician, including individualized plan of care and coordination with other care providers 24/7 contact phone numbers for assistance for urgent and routine care needs. Service will only be billed when office clinical staff spend 20 minutes or more in a month to coordinate care. Only one practitioner may furnish and bill the service in a calendar month. The patient may stop CCM services at any time (effective at the end of the month) by phone call to the office staff.   Patient agreed to services and verbal consent obtained.   Follow up plan:   Tatjana Secretary/administrator

## 2020-12-16 DIAGNOSIS — M5412 Radiculopathy, cervical region: Secondary | ICD-10-CM | POA: Diagnosis not present

## 2020-12-16 DIAGNOSIS — Z20828 Contact with and (suspected) exposure to other viral communicable diseases: Secondary | ICD-10-CM | POA: Diagnosis not present

## 2020-12-17 DIAGNOSIS — M542 Cervicalgia: Secondary | ICD-10-CM | POA: Diagnosis not present

## 2020-12-19 DIAGNOSIS — M542 Cervicalgia: Secondary | ICD-10-CM | POA: Diagnosis not present

## 2020-12-23 DIAGNOSIS — Z20828 Contact with and (suspected) exposure to other viral communicable diseases: Secondary | ICD-10-CM | POA: Diagnosis not present

## 2020-12-24 DIAGNOSIS — M542 Cervicalgia: Secondary | ICD-10-CM | POA: Diagnosis not present

## 2020-12-27 DIAGNOSIS — M542 Cervicalgia: Secondary | ICD-10-CM | POA: Diagnosis not present

## 2020-12-30 DIAGNOSIS — Z8616 Personal history of COVID-19: Secondary | ICD-10-CM | POA: Diagnosis not present

## 2020-12-31 DIAGNOSIS — M542 Cervicalgia: Secondary | ICD-10-CM | POA: Diagnosis not present

## 2021-01-02 ENCOUNTER — Other Ambulatory Visit: Payer: Self-pay

## 2021-01-02 ENCOUNTER — Ambulatory Visit (INDEPENDENT_AMBULATORY_CARE_PROVIDER_SITE_OTHER): Payer: Medicare Other | Admitting: Internal Medicine

## 2021-01-02 ENCOUNTER — Encounter: Payer: Self-pay | Admitting: Internal Medicine

## 2021-01-02 DIAGNOSIS — G8929 Other chronic pain: Secondary | ICD-10-CM | POA: Diagnosis not present

## 2021-01-02 DIAGNOSIS — F419 Anxiety disorder, unspecified: Secondary | ICD-10-CM

## 2021-01-02 DIAGNOSIS — N183 Chronic kidney disease, stage 3 unspecified: Secondary | ICD-10-CM

## 2021-01-02 DIAGNOSIS — M542 Cervicalgia: Secondary | ICD-10-CM | POA: Diagnosis not present

## 2021-01-02 DIAGNOSIS — M549 Dorsalgia, unspecified: Secondary | ICD-10-CM

## 2021-01-02 DIAGNOSIS — L570 Actinic keratosis: Secondary | ICD-10-CM

## 2021-01-02 LAB — COMPREHENSIVE METABOLIC PANEL
ALT: 15 U/L (ref 0–35)
AST: 23 U/L (ref 0–37)
Albumin: 4.1 g/dL (ref 3.5–5.2)
Alkaline Phosphatase: 81 U/L (ref 39–117)
BUN: 17 mg/dL (ref 6–23)
CO2: 33 mEq/L — ABNORMAL HIGH (ref 19–32)
Calcium: 9.5 mg/dL (ref 8.4–10.5)
Chloride: 99 mEq/L (ref 96–112)
Creatinine, Ser: 1.14 mg/dL (ref 0.40–1.20)
GFR: 48.13 mL/min — ABNORMAL LOW (ref >60.00)
Glucose, Bld: 99 mg/dL (ref 70–99)
Potassium: 4.1 mEq/L (ref 3.5–5.1)
Sodium: 137 mEq/L (ref 135–145)
Total Bilirubin: 0.6 mg/dL (ref 0.2–1.2)
Total Protein: 6.9 g/dL (ref 6.0–8.3)

## 2021-01-02 MED ORDER — KERENDIA 10 MG PO TABS: 10.0000 mg | ORAL_TABLET | Freq: Every day | ORAL | 3 refills | Status: DC

## 2021-01-02 MED ORDER — SUMATRIPTAN SUCCINATE 100 MG PO TABS: ORAL_TABLET | ORAL | 5 refills | Status: DC

## 2021-01-02 NOTE — Addendum Note (Signed)
Addended by: Jacobo Forest on: 01/02/2021 02:07 PM   Modules accepted: Orders

## 2021-01-02 NOTE — Assessment & Plan Note (Signed)
Cont w/Paxil 

## 2021-01-02 NOTE — Progress Notes (Signed)
Subjective:  Patient ID: Bianca Haley, female    DOB: 01/23/1951  Age: 70 y.o. MRN: [id number]  CC: Follow-up (3 months f/u)   HPI Bianca Haley presents for anxiety, chronic pain C/o skin lesions on the L arm and wrist x2 F/u HTN, CRI [name] lost 10 lbs on diet Richardson Landry is sick w/cancer  Outpatient Medications Prior to Visit  Medication Sig Dispense Refill   acetaminophen (TYLENOL) 650 MG CR tablet Take 1,300 mg by mouth every 8 (eight) hours as needed for pain.     albuterol (VENTOLIN HFA) 108 (90 Base) MCG/ACT inhaler INHALE 2 PUFFS INTO THE LUNGS EVERY 4 HOURS AS NEEDED FOR WHEEZING OR SHORTNESS OF BREATH 18 g 2   atenolol (TENORMIN) 100 MG tablet TAKE 1 TABLET(100 MG) BY MOUTH EVERY DAY (Patient taking differently: Take 100 mg by mouth daily.) 90 tablet 3   Carboxymethylcellulose Sodium (RETAINE CMC OP) Place 1 application into both eyes at bedtime.     Cholecalciferol (VITAMIN D) 2000 units tablet Take 2,000 Units by mouth at bedtime.      diazepam (VALIUM) 5 MG tablet Take 1 tablet (5 mg total) by mouth daily as needed for anxiety or muscle spasms. 30 tablet 2   diltiazem (CARDIZEM CD) 180 MG 24 hr capsule TAKE 1 CAPSULE(180 MG) BY MOUTH DAILY 90 capsule 3   fluticasone (FLONASE) 50 MCG/ACT nasal spray Place 2 sprays into both nostrils daily as needed for allergies or rhinitis.     Multiple Vitamin (MULTIVITAMIN WITH MINERALS) TABS tablet Take 2 tablets by mouth daily.      oxymorphone (OPANA) 10 MG tablet Take 1 tablet (10 mg total) by mouth daily as needed for pain. 1 tablet 0   pantoprazole (PROTONIX) 40 MG tablet TAKE 1 TABLET(40 MG) BY MOUTH TWICE DAILY BEFORE A MEAL (Patient taking differently: Take 40 mg by mouth 2 (two) times daily before a meal.) 180 tablet 3   PARoxetine (PAXIL) 40 MG tablet TAKE 1 TABLET(40 MG) BY MOUTH TWICE DAILY 180 tablet 3   Polyethyl Glycol-Propyl Glycol (SYSTANE OP) Place 1 drop into both eyes daily as needed (dry eyes).     polyethylene  glycol (MIRALAX / GLYCOLAX) packet Take 17 g by mouth 2 (two) times daily. (Patient taking differently: Take 17 g by mouth daily as needed (constipation). Mix in 8 oz liquid and drink) 14 each 0   potassium chloride (KLOR-CON) 10 MEQ tablet TAKE 1 TABLET(10 MEQ) BY MOUTH DAILY (Patient taking differently: Take 10 mEq by mouth daily.) 90 tablet 2   Probiotic CAPS Take 1 capsule by mouth daily.     rosuvastatin (CRESTOR) 5 MG tablet TAKE 1 TABLET(5 MG) BY MOUTH DAILY 90 tablet 3   SUMAtriptan (IMITREX) 100 MG tablet TAKE 1 TABLET BY MOUTH DAILY AS NEEDED FOR MIGRAINE. MAY REPEAT IN 2 HOURS IF HEADACHE PERSISTS OR RECURS 12 tablet 0   traZODone (DESYREL) 100 MG tablet Take 1 tablet (100 mg total) by mouth at bedtime as needed for sleep. for sleep 15 tablet 0   traZODone (DESYREL) 150 MG tablet TAKE 1 TABLET BY MOUTH AT BEDTIME AS NEEDED FOR SLEEP 30 tablet 11   vitamin B-12 (CYANOCOBALAMIN) 500 MCG tablet Take 500 mcg by mouth daily.     gabapentin (NEURONTIN) 300 MG capsule Take 1 capsule (300 mg total) by mouth 3 (three) times daily for 30 days. (Patient taking differently: Take 600-900 mg by mouth at bedtime.) 90 capsule 0   lidocaine (XYLOCAINE) 5 %  ointment Apply 1 application topically 3 (three) times daily as needed for moderate pain. (Patient not taking: Reported on 01/02/2021) 240 g 1   No facility-administered medications prior to visit.    ROS: Review of Systems  Constitutional:  Positive for fatigue. Negative for activity change, appetite change, chills and unexpected weight change.  HENT:  Negative for congestion, mouth sores and sinus pressure.   Eyes:  Negative for visual disturbance.  Respiratory:  Negative for cough and chest tightness.   Gastrointestinal:  Negative for abdominal pain and nausea.  Genitourinary:  Negative for difficulty urinating, frequency and vaginal pain.  Musculoskeletal:  Positive for arthralgias, back pain, neck pain and neck stiffness. Negative for gait  problem.  Skin:  Negative for pallor and rash.  Neurological:  Negative for dizziness, tremors, weakness, numbness and headaches.  Psychiatric/Behavioral:  Negative for confusion, sleep disturbance and suicidal ideas. The patient is nervous/anxious.    Objective:  BP 140/82 (BP Location: Left Arm)   Pulse (!) 59   Temp 98.5 F (36.9 C) (Oral)   Ht '5\' 5"'$  (1.651 m)   Wt 139 lb 12.8 oz (63.4 kg)   SpO2 93%   BMI 23.26 kg/m   BP Readings from Last 3 Encounters:  01/02/21 140/82  10/03/20 (!) 159/66  10/02/20 132/72    Wt Readings from Last 3 Encounters:  01/02/21 139 lb 12.8 oz (63.4 kg)  10/02/20 149 lb 12.8 oz (67.9 kg)  07/31/20 153 lb (69.4 kg)    Physical Exam Constitutional:      General: She is not in acute distress.    Appearance: She is well-developed.  HENT:     Head: Normocephalic.     Right Ear: External ear normal.     Left Ear: External ear normal.     Nose: Nose normal.  Eyes:     General:        Right eye: No discharge.        Left eye: No discharge.     Conjunctiva/sclera: Conjunctivae normal.     Pupils: Pupils are equal, round, and reactive to light.  Neck:     Thyroid: No thyromegaly.     Vascular: No JVD.     Trachea: No tracheal deviation.  Cardiovascular:     Rate and Rhythm: Normal rate and regular rhythm.     Heart sounds: Normal heart sounds.  Pulmonary:     Effort: No respiratory distress.     Breath sounds: No stridor. No wheezing.  Abdominal:     General: Bowel sounds are normal. There is no distension.     Palpations: Abdomen is soft. There is no mass.     Tenderness: There is no abdominal tenderness. There is no guarding or rebound.  Musculoskeletal:        General: Tenderness present.     Cervical back: Normal range of motion and neck supple. No rigidity.  Lymphadenopathy:     Cervical: No cervical adenopathy.  Skin:    Findings: No erythema or rash.  Neurological:     Cranial Nerves: No cranial nerve deficit.     Motor: No  abnormal muscle tone.     Coordination: Coordination normal.     Deep Tendon Reflexes: Reflexes normal.  Psychiatric:        Behavior: Behavior normal.        Thought Content: Thought content normal.        Judgment: Judgment normal.   2 AK skin lesions on the L arm  and wrist F/u HTN   Procedure Note :     Procedure : Cryosurgery   Indication:  2 AK skin lesions on the L arm and wrist F/u HTN   Risks including unsuccessful procedure , bleeding, infection, bruising, scar, a need for a repeat  procedure and others were explained to the patient in detail as well as the benefits. Informed consent was obtained verbally.    2 skin lesions on the L arm and wrist were treated with liquid nitrogen on a Q-tip in a usual fasion . Band-Aid was applied and antibiotic ointment was given for a later use.   Tolerated well. Complications none.   Postprocedure instructions :     Keep the wounds clean. You can wash them with liquid soap and water. Pat dry with gauze or a Kleenex tissue  Before applying antibiotic ointment and a Band-Aid.   You need to report immediately  if  any signs of infection develop.   Lab Results  Component Value Date   WBC 6.5 07/26/2020   HGB 14.6 07/26/2020   HCT 44.9 07/26/2020   PLT 297 07/26/2020   GLUCOSE 147 (H) 07/27/2020   CHOL 155 04/03/2020   TRIG 102.0 04/03/2020   HDL 61.40 04/03/2020   LDLCALC 73 04/03/2020   ALT 12 07/26/2020   AST 27 07/26/2020   NA 137 07/27/2020   K 3.2 (L) 07/27/2020   CL 98 07/27/2020   CREATININE 0.81 07/27/2020   BUN 7 (L) 07/27/2020   CO2 29 07/27/2020   TSH 1.092 07/26/2020   INR 1.0 09/21/2018   HGBA1C 6.1 06/23/2018    CT CERVICAL SPINE W CONTRAST  Result Date: 10/03/2020 CLINICAL DATA:  Spondylosis without myelopathy. Neck pain and right shoulder pain. Low back pain without particular radiation. FLUOROSCOPY TIME:  1 minutes 44 seconds. 295.72 micro gray meter squared PROCEDURE: LUMBAR PUNCTURE FOR CERVICAL AND  LUMBAR MYELOGRAM CERVICAL AND LUMBAR MYELOGRAM CT CERVICAL MYELOGRAM CT LUMBAR MYELOGRAM After thorough discussion of risks and benefits of the procedure including bleeding, infection, injury to nerves, blood vessels, adjacent structures as well as headache and CSF leak, written and oral informed consent was obtained. Consent was obtained by Dr. Nelson Chimes. Patient was positioned prone on the fluoroscopy table. Local anesthesia was provided with 1% lidocaine without epinephrine after prepped and draped in the usual sterile fashion. Puncture was performed at L2-3 using a 3 1/2 inch 22-gauge spinal needle via left paramedian approach. Using a single pass through the dura, the needle was placed within the thecal sac, with return of clear CSF. 10 mL of Isovue M-300 was injected into the thecal sac, with normal opacification of the nerve roots and cauda equina consistent with free flow within the subarachnoid space. The patient was then moved to the trendelenburg position and contrast flowed into the Cervical spine region. I personally performed the lumbar puncture and administered the intrathecal contrast. I also personally performed acquisition of the myelogram images. TECHNIQUE: Contiguous axial images were obtained through the Cervical and Lumbar spine after the intrathecal infusion of infusion. Coronal and sagittal reconstructions were obtained of the axial image sets. FINDINGS: CERVICAL AND LUMBAR MYELOGRAM FINDINGS: Lumbar region: Thoracolumbar curvature convex to the left with the apex at L2. No anterolisthesis in the supine position or with standing flexion extension. The patient does not generate much bending. There is lateral recess narrowing at L2-3 and L3-4, right more than left. See results of CT for further detail. Cervical region: Apparent wide patency of the  spinal canal. No visible root sleeve compression. No significant anterior extradural defect. Small anterior extradural defect at C4-5. CT CERVICAL  MYELOGRAM FINDINGS: Foramen magnum is widely patent. Ordinary osteoarthritis at the C1-2 articulation without encroachment upon the neural structures. C2-3: Small uncovertebral osteophytes and facet joint osteophytes. No significant narrowing of the canal or foramina. C3-4: Mild bulging of the disc. Mild bilateral facet osteoarthritis. No compressive narrowing of the canal or foramina. C4-5: Degenerative spondylosis with disc space narrowing, endplate osteophytes and bulging of the disc. Mild facet osteoarthritis. Bilateral foraminal narrowing, right more than left. Right C5 nerve compression could occur. C5-6: Bilateral uncovertebral prominence and facet osteophytes. No central canal stenosis. Bilateral foraminal narrowing, worse on the right than the left. Either C6 nerve could be affected, more likely the right. C6-7: Minimal bulging of the disc. Mild bilateral facet degeneration. Mild bilateral foraminal narrowing, not likely compressive. C7-T1: No significant disc finding. Bilateral facet degeneration with osteophyte formation. Bilateral foraminal narrowing that could affect either C8 nerve. T1-2: Bilateral facet osteoarthritis right more than left. Right foraminal narrowing that could affect the right T1 nerve. Beginning at T1, there appears to be spinal fusion with sufficient patency of the canal and foramina at T2-3 and T3-4. CT LUMBAR MYELOGRAM FINDINGS: L5 is a transitional vertebra and is sacralized. This numbering terminology is identical to previous studies. Scoliotic curvature convex to the left the apex at L2. Mild, non-compressive disc bulges at T11-12, T12-L1 and L1-2. Facet degeneration and hypertrophy at the L1-2 level but no compressive stenosis at that level. L2-3: Chronic fusion at this level. Previous posterior decompression on the left. Chronic bony narrowing of the lateral recesses and foramina, but without likely neural compression. L3-4: Disc degeneration with endplate osteophytes and  bulging of the disc. Pronounced facet and ligamentous hypertrophy. Moderate multifactorial stenosis, most severe in the lateral recesses and neural foramina, left more than right. Neural compression could occur in these locations, particularly on the left. L4-5: Mild bulging of the disc. Bilateral facet degeneration and hypertrophy. Mild stenosis of the left lateral recess but without distinct neural compression. L5-S1: Transitional level with L5 being sacralized. Wide patency of the canal and foramina. Bilateral sacroiliac osteoarthritis is present. Findings at L3-4 and L4-5 of worsened since the previous exam. Fusion at the L2-3 level has occurred since 2013. IMPRESSION: Cervical region: Mild degenerative disc disease throughout the cervical region. Minor disc bulges C4-5. Facet osteoarthritis throughout the cervical region. There is foraminal narrowing that could possibly cause neural compression on the right at C4-5, the right at C5-6, and bilateral at C7-T1. Right foraminal stenosis also present in the upper thoracic region at T1-2. There is fusion at at least the region from T2 through T4. Lumbar region: L5 is transitional as described previously. Development of fusion across the L2-3 disc level. Chronic bony narrowing of the canal and foramina but without likely neural compression. L3-4: Disc degeneration and facet degeneration with moderate multifactorial stenosis, worse on the left than the right. Neural compression could occur at this level, particularly on the left. L4-5: Bulging of the disc. Facet degeneration and hypertrophy, left more than right. Mild stenosis of the left lateral recess, not grossly compressive. Electronically Signed   By: Nelson Chimes M.D.   On: 10/03/2020 11:24   CT LUMBAR SPINE W CONTRAST  Result Date: 10/03/2020 CLINICAL DATA:  Spondylosis without myelopathy. Neck pain and right shoulder pain. Low back pain without particular radiation. FLUOROSCOPY TIME:  1 minutes 44 seconds.  295.72 micro gray meter  squared PROCEDURE: LUMBAR PUNCTURE FOR CERVICAL AND LUMBAR MYELOGRAM CERVICAL AND LUMBAR MYELOGRAM CT CERVICAL MYELOGRAM CT LUMBAR MYELOGRAM After thorough discussion of risks and benefits of the procedure including bleeding, infection, injury to nerves, blood vessels, adjacent structures as well as headache and CSF leak, written and oral informed consent was obtained. Consent was obtained by Dr. Nelson Chimes. Patient was positioned prone on the fluoroscopy table. Local anesthesia was provided with 1% lidocaine without epinephrine after prepped and draped in the usual sterile fashion. Puncture was performed at L2-3 using a 3 1/2 inch 22-gauge spinal needle via left paramedian approach. Using a single pass through the dura, the needle was placed within the thecal sac, with return of clear CSF. 10 mL of Isovue M-300 was injected into the thecal sac, with normal opacification of the nerve roots and cauda equina consistent with free flow within the subarachnoid space. The patient was then moved to the trendelenburg position and contrast flowed into the Cervical spine region. I personally performed the lumbar puncture and administered the intrathecal contrast. I also personally performed acquisition of the myelogram images. TECHNIQUE: Contiguous axial images were obtained through the Cervical and Lumbar spine after the intrathecal infusion of infusion. Coronal and sagittal reconstructions were obtained of the axial image sets. FINDINGS: CERVICAL AND LUMBAR MYELOGRAM FINDINGS: Lumbar region: Thoracolumbar curvature convex to the left with the apex at L2. No anterolisthesis in the supine position or with standing flexion extension. The patient does not generate much bending. There is lateral recess narrowing at L2-3 and L3-4, right more than left. See results of CT for further detail. Cervical region: Apparent wide patency of the spinal canal. No visible root sleeve compression. No significant  anterior extradural defect. Small anterior extradural defect at C4-5. CT CERVICAL MYELOGRAM FINDINGS: Foramen magnum is widely patent. Ordinary osteoarthritis at the C1-2 articulation without encroachment upon the neural structures. C2-3: Small uncovertebral osteophytes and facet joint osteophytes. No significant narrowing of the canal or foramina. C3-4: Mild bulging of the disc. Mild bilateral facet osteoarthritis. No compressive narrowing of the canal or foramina. C4-5: Degenerative spondylosis with disc space narrowing, endplate osteophytes and bulging of the disc. Mild facet osteoarthritis. Bilateral foraminal narrowing, right more than left. Right C5 nerve compression could occur. C5-6: Bilateral uncovertebral prominence and facet osteophytes. No central canal stenosis. Bilateral foraminal narrowing, worse on the right than the left. Either C6 nerve could be affected, more likely the right. C6-7: Minimal bulging of the disc. Mild bilateral facet degeneration. Mild bilateral foraminal narrowing, not likely compressive. C7-T1: No significant disc finding. Bilateral facet degeneration with osteophyte formation. Bilateral foraminal narrowing that could affect either C8 nerve. T1-2: Bilateral facet osteoarthritis right more than left. Right foraminal narrowing that could affect the right T1 nerve. Beginning at T1, there appears to be spinal fusion with sufficient patency of the canal and foramina at T2-3 and T3-4. CT LUMBAR MYELOGRAM FINDINGS: L5 is a transitional vertebra and is sacralized. This numbering terminology is identical to previous studies. Scoliotic curvature convex to the left the apex at L2. Mild, non-compressive disc bulges at T11-12, T12-L1 and L1-2. Facet degeneration and hypertrophy at the L1-2 level but no compressive stenosis at that level. L2-3: Chronic fusion at this level. Previous posterior decompression on the left. Chronic bony narrowing of the lateral recesses and foramina, but without  likely neural compression. L3-4: Disc degeneration with endplate osteophytes and bulging of the disc. Pronounced facet and ligamentous hypertrophy. Moderate multifactorial stenosis, most severe in the lateral recesses and  neural foramina, left more than right. Neural compression could occur in these locations, particularly on the left. L4-5: Mild bulging of the disc. Bilateral facet degeneration and hypertrophy. Mild stenosis of the left lateral recess but without distinct neural compression. L5-S1: Transitional level with L5 being sacralized. Wide patency of the canal and foramina. Bilateral sacroiliac osteoarthritis is present. Findings at L3-4 and L4-5 of worsened since the previous exam. Fusion at the L2-3 level has occurred since 2013. IMPRESSION: Cervical region: Mild degenerative disc disease throughout the cervical region. Minor disc bulges C4-5. Facet osteoarthritis throughout the cervical region. There is foraminal narrowing that could possibly cause neural compression on the right at C4-5, the right at C5-6, and bilateral at C7-T1. Right foraminal stenosis also present in the upper thoracic region at T1-2. There is fusion at at least the region from T2 through T4. Lumbar region: L5 is transitional as described previously. Development of fusion across the L2-3 disc level. Chronic bony narrowing of the canal and foramina but without likely neural compression. L3-4: Disc degeneration and facet degeneration with moderate multifactorial stenosis, worse on the left than the right. Neural compression could occur at this level, particularly on the left. L4-5: Bulging of the disc. Facet degeneration and hypertrophy, left more than right. Mild stenosis of the left lateral recess, not grossly compressive. Electronically Signed   By: Nelson Chimes M.D.   On: 10/03/2020 11:24   DG MYELOGRAPHY LUMBAR INJ MULTI REGION  Result Date: 10/03/2020 CLINICAL DATA:  Spondylosis without myelopathy. Neck pain and right shoulder  pain. Low back pain without particular radiation. FLUOROSCOPY TIME:  1 minutes 44 seconds. 295.72 micro gray meter squared PROCEDURE: LUMBAR PUNCTURE FOR CERVICAL AND LUMBAR MYELOGRAM CERVICAL AND LUMBAR MYELOGRAM CT CERVICAL MYELOGRAM CT LUMBAR MYELOGRAM After thorough discussion of risks and benefits of the procedure including bleeding, infection, injury to nerves, blood vessels, adjacent structures as well as headache and CSF leak, written and oral informed consent was obtained. Consent was obtained by Dr. Nelson Chimes. Patient was positioned prone on the fluoroscopy table. Local anesthesia was provided with 1% lidocaine without epinephrine after prepped and draped in the usual sterile fashion. Puncture was performed at L2-3 using a 3 1/2 inch 22-gauge spinal needle via left paramedian approach. Using a single pass through the dura, the needle was placed within the thecal sac, with return of clear CSF. 10 mL of Isovue M-300 was injected into the thecal sac, with normal opacification of the nerve roots and cauda equina consistent with free flow within the subarachnoid space. The patient was then moved to the trendelenburg position and contrast flowed into the Cervical spine region. I personally performed the lumbar puncture and administered the intrathecal contrast. I also personally performed acquisition of the myelogram images. TECHNIQUE: Contiguous axial images were obtained through the Cervical and Lumbar spine after the intrathecal infusion of infusion. Coronal and sagittal reconstructions were obtained of the axial image sets. FINDINGS: CERVICAL AND LUMBAR MYELOGRAM FINDINGS: Lumbar region: Thoracolumbar curvature convex to the left with the apex at L2. No anterolisthesis in the supine position or with standing flexion extension. The patient does not generate much bending. There is lateral recess narrowing at L2-3 and L3-4, right more than left. See results of CT for further detail. Cervical region: Apparent  wide patency of the spinal canal. No visible root sleeve compression. No significant anterior extradural defect. Small anterior extradural defect at C4-5. CT CERVICAL MYELOGRAM FINDINGS: Foramen magnum is widely patent. Ordinary osteoarthritis at the C1-2 articulation without  encroachment upon the neural structures. C2-3: Small uncovertebral osteophytes and facet joint osteophytes. No significant narrowing of the canal or foramina. C3-4: Mild bulging of the disc. Mild bilateral facet osteoarthritis. No compressive narrowing of the canal or foramina. C4-5: Degenerative spondylosis with disc space narrowing, endplate osteophytes and bulging of the disc. Mild facet osteoarthritis. Bilateral foraminal narrowing, right more than left. Right C5 nerve compression could occur. C5-6: Bilateral uncovertebral prominence and facet osteophytes. No central canal stenosis. Bilateral foraminal narrowing, worse on the right than the left. Either C6 nerve could be affected, more likely the right. C6-7: Minimal bulging of the disc. Mild bilateral facet degeneration. Mild bilateral foraminal narrowing, not likely compressive. C7-T1: No significant disc finding. Bilateral facet degeneration with osteophyte formation. Bilateral foraminal narrowing that could affect either C8 nerve. T1-2: Bilateral facet osteoarthritis right more than left. Right foraminal narrowing that could affect the right T1 nerve. Beginning at T1, there appears to be spinal fusion with sufficient patency of the canal and foramina at T2-3 and T3-4. CT LUMBAR MYELOGRAM FINDINGS: L5 is a transitional vertebra and is sacralized. This numbering terminology is identical to previous studies. Scoliotic curvature convex to the left the apex at L2. Mild, non-compressive disc bulges at T11-12, T12-L1 and L1-2. Facet degeneration and hypertrophy at the L1-2 level but no compressive stenosis at that level. L2-3: Chronic fusion at this level. Previous posterior decompression on  the left. Chronic bony narrowing of the lateral recesses and foramina, but without likely neural compression. L3-4: Disc degeneration with endplate osteophytes and bulging of the disc. Pronounced facet and ligamentous hypertrophy. Moderate multifactorial stenosis, most severe in the lateral recesses and neural foramina, left more than right. Neural compression could occur in these locations, particularly on the left. L4-5: Mild bulging of the disc. Bilateral facet degeneration and hypertrophy. Mild stenosis of the left lateral recess but without distinct neural compression. L5-S1: Transitional level with L5 being sacralized. Wide patency of the canal and foramina. Bilateral sacroiliac osteoarthritis is present. Findings at L3-4 and L4-5 of worsened since the previous exam. Fusion at the L2-3 level has occurred since 2013. IMPRESSION: Cervical region: Mild degenerative disc disease throughout the cervical region. Minor disc bulges C4-5. Facet osteoarthritis throughout the cervical region. There is foraminal narrowing that could possibly cause neural compression on the right at C4-5, the right at C5-6, and bilateral at C7-T1. Right foraminal stenosis also present in the upper thoracic region at T1-2. There is fusion at at least the region from T2 through T4. Lumbar region: L5 is transitional as described previously. Development of fusion across the L2-3 disc level. Chronic bony narrowing of the canal and foramina but without likely neural compression. L3-4: Disc degeneration and facet degeneration with moderate multifactorial stenosis, worse on the left than the right. Neural compression could occur at this level, particularly on the left. L4-5: Bulging of the disc. Facet degeneration and hypertrophy, left more than right. Mild stenosis of the left lateral recess, not grossly compressive. Electronically Signed   By: Nelson Chimes M.D.   On: 10/03/2020 11:24    Assessment & Plan:    [name], MD

## 2021-01-02 NOTE — Patient Instructions (Signed)
   Postprocedure instructions :     Keep the wounds clean. You can wash them with liquid soap and water. Pat dry with gauze or a Kleenex tissue  Before applying antibiotic ointment and a Band-Aid.   You need to report immediately  if  any signs of infection develop.    

## 2021-01-02 NOTE — Assessment & Plan Note (Signed)
Options are discussed Will treat w/cryo

## 2021-01-02 NOTE — Assessment & Plan Note (Addendum)
Cont w/good hydration Monitor GFR Will try Kerendia 10 mg/d

## 2021-01-02 NOTE — Assessment & Plan Note (Signed)
[facility] Clinic said they will not cont to Rx Diazepam for spasms - I was asked to rx Diazepam. The pt understands risks. Will not take together w/oxymorphone...  Potential benefits of a long term benzodiazepines  use as well as potential risks  and complications were explained to the patient and were aknowledged.

## 2021-01-06 DIAGNOSIS — Z20828 Contact with and (suspected) exposure to other viral communicable diseases: Secondary | ICD-10-CM | POA: Diagnosis not present

## 2021-01-08 ENCOUNTER — Telehealth: Payer: Self-pay | Admitting: Internal Medicine

## 2021-01-08 NOTE — Telephone Encounter (Signed)
Patient says she is concerned about rx Finerenone (KERENDIA) 10 MG TABS provider recently prescribed at visit 09.01.22  Would like for provider or nurse to call her  Says she is concerned about her kidney function as well as the side effects of this rx  Please call patient [phone]

## 2021-01-10 DIAGNOSIS — M542 Cervicalgia: Secondary | ICD-10-CM | POA: Diagnosis not present

## 2021-01-10 NOTE — Telephone Encounter (Signed)
See below

## 2021-01-10 NOTE — Telephone Encounter (Signed)
Would recommend PCP to address Monday. Per her last labs her GFR is 48 which would be stage 3 chronic kidney disease.

## 2021-01-10 NOTE — Telephone Encounter (Signed)
Please advise as PCP is out and the pt has called with concerns about  Finerenone (KERENDIA) 10 MG TABS. Pt states she looked into the medication and seen it is to treat chronic kidney failure and wants to know does she have Kidney failure and is this medication needed?  **pt wants to know if she has kidney failure how severe and what is it that can be done to help?

## 2021-01-13 DIAGNOSIS — Z8616 Personal history of COVID-19: Secondary | ICD-10-CM | POA: Diagnosis not present

## 2021-01-13 NOTE — Telephone Encounter (Signed)
OK to start Bianca Haley

## 2021-01-16 ENCOUNTER — Ambulatory Visit: Payer: Medicare Other

## 2021-01-20 DIAGNOSIS — Z20828 Contact with and (suspected) exposure to other viral communicable diseases: Secondary | ICD-10-CM | POA: Diagnosis not present

## 2021-01-27 DIAGNOSIS — Z20828 Contact with and (suspected) exposure to other viral communicable diseases: Secondary | ICD-10-CM | POA: Diagnosis not present

## 2021-01-28 DIAGNOSIS — M542 Cervicalgia: Secondary | ICD-10-CM | POA: Diagnosis not present

## 2021-01-28 DIAGNOSIS — M25561 Pain in right knee: Secondary | ICD-10-CM | POA: Diagnosis not present

## 2021-01-28 DIAGNOSIS — Z981 Arthrodesis status: Secondary | ICD-10-CM | POA: Diagnosis not present

## 2021-01-28 DIAGNOSIS — M5412 Radiculopathy, cervical region: Secondary | ICD-10-CM | POA: Diagnosis not present

## 2021-02-03 DIAGNOSIS — Z8616 Personal history of COVID-19: Secondary | ICD-10-CM | POA: Diagnosis not present

## 2021-02-04 DIAGNOSIS — M542 Cervicalgia: Secondary | ICD-10-CM | POA: Diagnosis not present

## 2021-02-05 ENCOUNTER — Telehealth: Payer: Self-pay

## 2021-02-05 DIAGNOSIS — H43811 Vitreous degeneration, right eye: Secondary | ICD-10-CM | POA: Diagnosis not present

## 2021-02-05 DIAGNOSIS — H35033 Hypertensive retinopathy, bilateral: Secondary | ICD-10-CM | POA: Diagnosis not present

## 2021-02-05 DIAGNOSIS — H35363 Drusen (degenerative) of macula, bilateral: Secondary | ICD-10-CM | POA: Diagnosis not present

## 2021-02-05 DIAGNOSIS — H04123 Dry eye syndrome of bilateral lacrimal glands: Secondary | ICD-10-CM | POA: Diagnosis not present

## 2021-02-05 DIAGNOSIS — H35361 Drusen (degenerative) of macula, right eye: Secondary | ICD-10-CM | POA: Diagnosis not present

## 2021-02-05 NOTE — Telephone Encounter (Signed)
   For assistance with prior auth, Teacher, adult education Phone [phone]

## 2021-02-05 NOTE — Chronic Care Management (AMB) (Signed)
Chronic Care Management Pharmacy Assistant   Name: POPPY MCAFEE  MRN: [id number] DOB: 1950/08/31  Bianca Haley is an 70 y.o. year old female who presents for his initial CCM visit with the clinical pharmacist.  Recent office visits:  01/02/21-Aleksei V. Plotnikov, MD (PCP) 3 month follow up visit. Start on Finerenone [facility]) 10 MG TABS. Labs ordered. Follow up in 3 months. 10/23/20-Aleksei V. Plotnikov, MD (PCP) Notes not available. 10/16/20-Aleksei V. Plotnikov, MD (PCP) Notes not available. 10/11/20-Aleksei V. Plotnikov, MD (PCP) Notes not available. 10/02/20-Aleksei V. Plotnikov, MD (PCP) 6 month follow up. Follow up in 3 months. 09/27/20-Aleksei V. Plotnikov, MD (PCP) Notes not available. 09/20/20-Aleksei V. Plotnikov, MD (PCP) Notes not available. 05/05/822-Aleksei V. Plotnikov, MD (PCP) Notes not available. 08/30/20-Aleksei V. Plotnikov, MD (PCP) Notes not available. 08/28/20-Aleksei V. Plotnikov, MD (PCP) Notes not available. 08/21/20-Aleksei V. Plotnikov, MD (PCP) Notes not available. 08/19/20-Aleksei V. Plotnikov, MD (PCP) Notes not available. 08/16/20-Aleksei V. Plotnikov, MD (PCP) Notes not available. 08/14/20-Aleksei V. Plotnikov, MD (PCP) Notes not available. 08/08/20-Aleksei V. Plotnikov, MD (PCP) Notes not available.  Recent consult visits:  11/21/20-[name] Ardis Rowan, [facility]. Notes not available. 10/01/20-Scott MacDiarmid (Urology) Notes not available. 09/23/20-[name] Ardis Rowan, [facility] (Neurosurgery) Notes not available. 08/22/20-Todd Meisinger (OBGYN) Notes not available. 08/07/20-Shanti M. [place] (OBGYN) Notes not available.  Hospital visits:  None in previous 6 months  Medications: Outpatient Encounter Medications as of 02/05/2021  Medication Sig Note   acetaminophen (TYLENOL) 650 MG CR tablet Take 1,300 mg by mouth every 8 (eight) hours as needed for pain.    albuterol (VENTOLIN HFA) 108 (90 Base) MCG/ACT inhaler INHALE 2 PUFFS INTO THE LUNGS  EVERY 4 HOURS AS NEEDED FOR WHEEZING OR SHORTNESS OF BREATH    atenolol (TENORMIN) 100 MG tablet TAKE 1 TABLET(100 MG) BY MOUTH EVERY DAY (Patient taking differently: Take 100 mg by mouth daily.)    Carboxymethylcellulose Sodium (RETAINE CMC OP) Place 1 application into both eyes at bedtime.    Cholecalciferol (VITAMIN D) 2000 units tablet Take 2,000 Units by mouth at bedtime.     diazepam (VALIUM) 5 MG tablet Take 1 tablet (5 mg total) by mouth daily as needed for anxiety or muscle spasms.    diltiazem (CARDIZEM CD) 180 MG 24 hr capsule TAKE 1 CAPSULE(180 MG) BY MOUTH DAILY    Finerenone (KERENDIA) 10 MG TABS Take 10 mg by mouth daily.    fluticasone (FLONASE) 50 MCG/ACT nasal spray Place 2 sprays into both nostrils daily as needed for allergies or rhinitis.    gabapentin (NEURONTIN) 300 MG capsule Take 1 capsule (300 mg total) by mouth 3 (three) times daily for 30 days. (Patient taking differently: Take 600-900 mg by mouth at bedtime.) 10/17/2018: Based on the pain level   Multiple Vitamin (MULTIVITAMIN WITH MINERALS) TABS tablet Take 2 tablets by mouth daily.     oxymorphone (OPANA) 10 MG tablet Take 1 tablet (10 mg total) by mouth daily as needed for pain.    pantoprazole (PROTONIX) 40 MG tablet TAKE 1 TABLET(40 MG) BY MOUTH TWICE DAILY BEFORE A MEAL (Patient taking differently: Take 40 mg by mouth 2 (two) times daily before a meal.)    PARoxetine (PAXIL) 40 MG tablet TAKE 1 TABLET(40 MG) BY MOUTH TWICE DAILY    Polyethyl Glycol-Propyl Glycol (SYSTANE OP) Place 1 drop into both eyes daily as needed (dry eyes).    polyethylene glycol (MIRALAX / GLYCOLAX) packet Take 17 g by mouth 2 (two) times daily. (Patient taking differently: Take  17 g by mouth daily as needed (constipation). Mix in 8 oz liquid and drink)    potassium chloride (KLOR-CON) 10 MEQ tablet TAKE 1 TABLET(10 MEQ) BY MOUTH DAILY (Patient taking differently: Take 10 mEq by mouth daily.)    Probiotic CAPS Take 1 capsule by mouth daily.     rosuvastatin (CRESTOR) 5 MG tablet TAKE 1 TABLET(5 MG) BY MOUTH DAILY    SUMAtriptan (IMITREX) 100 MG tablet TAKE 1 TABLET BY MOUTH DAILY AS NEEDED FOR MIGRAINE. MAY REPEAT IN 2 HOURS IF HEADACHE PERSISTS OR RECURS    traZODone (DESYREL) 100 MG tablet Take 1 tablet (100 mg total) by mouth at bedtime as needed for sleep. for sleep    traZODone (DESYREL) 150 MG tablet TAKE 1 TABLET BY MOUTH AT BEDTIME AS NEEDED FOR SLEEP    vitamin B-12 (CYANOCOBALAMIN) 500 MCG tablet Take 500 mcg by mouth daily.    No facility-administered encounter medications on file as of 02/05/2021.   Acetaminophen (TYLENOL) 650 MG CR tablet Last filled:None noted Albuterol (VENTOLIN HFA) 108 (90 Base) MCG/ACT inhaler Last filled:05/11/20 16 DS Atenolol (TENORMIN) 100 MG tablet Last filled:11/15/20 90 DS Carboxymethylcellulose Sodium (RETAINE CMC OP) Last filled:None noted Cholecalciferol (VITAMIN D) 2000 units tablet Last filled:None noted Diazepam (VALIUM) 5 MG tablet Last filled:12/23/20 30 DS Diltiazem (CARDIZEM CD) 180 MG 24 hr capsule Last filled:01/10/21 90 DS Finerenone (KERENDIA) 10 MG TABS Last filled:01/13/21 30 DS Fluticasone (FLONASE) 50 MCG/ACT nasal spray Last filled:None noted Gabapentin (NEURONTIN) 300 MG capsule (Expired) Last filled:01/21/21 22 DS Oxymorphone (OPANA) 10 MG tablet Last filled:01/28/21 30 DS Pantoprazole (PROTONIX) 40 MG tablet Last filled:01/13/21 90 DS PARoxetine (PAXIL) 40 MG tablet Last filled:01/13/21 90 DS Polyethyl Glycol-Propyl Glycol (SYSTANE OP) Last filled:None noted Potassium chloride (KLOR-CON) 10 MEQ tablet Last filled:12/23/20 90 DS Rosuvastatin (CRESTOR) 5 MG tablet Last filled:12/23/20 90 DS SUMAtriptan (IMITREX) 100 MG tablet Last filled:01/02/21 30 DS TraZODone (DESYREL) 150 MG tablet Last filled:01/23/21 30 DS Vitamin B-12 (CYANOCOBALAMIN) 500 MCG tablet Last filled:None noted   Care Gaps: MAMMOGRAM:Overdue since 05/04/2016 Zoster Vaccines- Shingrix:Last  completed: Feb 07, 2018 COLONOSCOPY :Last completed: Apr 12, 2014 COVID-19 Vaccine:Last completed: Apr 05, 2020 INFLUENZA VACCINE:Last completed: Apr 03, 2020  Star Rating Drugs: Rosuvastatin (CRESTOR) 5 MG tablet Last filled:12/23/20 90 DS  Corrie Mckusick, [place]

## 2021-02-07 ENCOUNTER — Ambulatory Visit (INDEPENDENT_AMBULATORY_CARE_PROVIDER_SITE_OTHER): Payer: Medicare Other

## 2021-02-07 ENCOUNTER — Other Ambulatory Visit: Payer: Self-pay

## 2021-02-07 DIAGNOSIS — M542 Cervicalgia: Secondary | ICD-10-CM

## 2021-02-07 DIAGNOSIS — I10 Essential (primary) hypertension: Secondary | ICD-10-CM

## 2021-02-07 DIAGNOSIS — Z23 Encounter for immunization: Secondary | ICD-10-CM

## 2021-02-07 DIAGNOSIS — K219 Gastro-esophageal reflux disease without esophagitis: Secondary | ICD-10-CM

## 2021-02-07 DIAGNOSIS — M549 Dorsalgia, unspecified: Secondary | ICD-10-CM

## 2021-02-07 DIAGNOSIS — N183 Chronic kidney disease, stage 3 unspecified: Secondary | ICD-10-CM

## 2021-02-07 DIAGNOSIS — G8929 Other chronic pain: Secondary | ICD-10-CM

## 2021-02-07 NOTE — Patient Instructions (Signed)
[name],  It was great to talk to you today!  Please call me with any questions or concerns.  Visit Information   PATIENT GOALS:   Goals Addressed             This Visit's Progress    Prevent Falls and Broken Bones-Osteoporosis       Timeframe:  Long-Range Goal Priority:  High Start Date:  02/07/2021                           Expected End Date:  08/08/2021                     Follow Up Date 03/14/2021   - always use handrails on the stairs - always wear shoes or slippers with non-slip sole - get at least 10 minutes of activity every day - keep cell phone with me always - make an emergency alert plan in case I fall - pick up clutter from the floors - remove, or use a non-slip pad, with my throw rugs - wear low heeled or flat shoes with non-skid soles    Why is this important?   When you fall, there are 3 things that control if a bone breaks or not.  These are the fall itself, how hard and the direction that you fall and how fragile your bones are.  Preventing falls is very important for you because of fragile bones.       Track and Manage My Blood Pressure-Hypertension       Timeframe:  Long-Range Goal Priority:  High Start Date:  02/07/2021                           Expected End Date:  08/08/2021                     Follow Up Date 03/14/2021   - check blood pressure 3 times per week - choose a place to take my blood pressure (home, clinic or office, retail store) - write blood pressure results in a log or diary    Why is this important?   You won't feel high blood pressure, but it can still hurt your blood vessels.  High blood pressure can cause heart or kidney problems. It can also cause a stroke.  Making lifestyle changes like losing a little weight or eating less salt will help.  Checking your blood pressure at home and at different times of the day can help to control blood pressure.  If the doctor prescribes medicine remember to take it the way the doctor  ordered.  Call the office if you cannot afford the medicine or if there are questions about it.          Consent to CCM Services: Ms. Grumbine was given information about Chronic Care Management services including:  CCM service includes personalized support from designated clinical staff supervised by her physician, including individualized plan of care and coordination with other care providers 24/7 contact phone numbers for assistance for urgent and routine care needs. Service will only be billed when office clinical staff spend 20 minutes or more in a month to coordinate care. Only one practitioner may furnish and bill the service in a calendar month. The patient may stop CCM services at any time (effective at the end of the month) by phone call to the office staff. The  patient will be responsible for cost sharing (co-pay) of up to 20% of the service fee (after annual deductible is met).  Patient agreed to services and verbal consent obtained.   Patient verbalizes understanding of instructions provided today and agrees to view in [place].   Telephone follow up appointment with care management team member scheduled for: 1 month The patient has been provided with contact information for the care management team and has been advised to call with any health related questions or concerns.   Tomasa Blase, PharmD Clinical Pharmacist, [place]   CLINICAL CARE PLAN: Patient Care Plan: CCM Care Plan     Problem Identified: Hypertension, Hyperlipidemia, GERD, Chronic Kidney Disease, Depression, Osteoporosis, Overactive Bladder, and Chronic Pain   Priority: High  Onset Date: 02/07/2021     Long-Range Goal: Disease Management   Start Date: 02/07/2021  Expected End Date: 08/08/2021  This Visit's Progress: On track  Priority: High  Note:   Current Barriers:  Unable to independently monitor therapeutic efficacy  Pharmacist Clinical Goal(s):  Patient will achieve adherence to  monitoring guidelines and medication adherence to achieve therapeutic efficacy maintain control of LDL and as evidenced by next lipid panel / BP logs  through collaboration with PharmD and provider.   Interventions: 1:1 collaboration with Plotnikov, [name], MD regarding development and update of comprehensive plan of care as evidenced by provider attestation and co-signature Inter-disciplinary care team collaboration (see longitudinal plan of care) Comprehensive medication review performed; medication list updated in electronic medical record  Hypertension (BP goal <140/90) -Not ideally controlled -Current treatment: Diltiazem CD 24hr capsule - 179m daily  Atenolol 1069m- 1 tablet once daily  -Medications previously tried: HCTZ, Metoprolol tartrate, maxzide  -Current home readings: n/a BP Readings from Last 3 Encounters:  01/02/21 140/82  10/03/20 (!) 159/66  10/02/20 132/72  -Current dietary habits: notes she does lightly salt her food, drinks 2 cups of coffee every AM -Current exercise habits: home cardio workouts 30-45 minutes a day - follows with a trainer at her community 1-2 times weekly -Denies hypotensive/hypertensive symptoms -Educated on BP goals and benefits of medications for prevention of heart attack, stroke and kidney damage; Daily salt intake goal < 2300 mg; Exercise goal of 150 minutes per week; Importance of home blood pressure monitoring; Proper BP monitoring technique; Symptoms of hypotension and importance of maintaining adequate hydration; -Counseled to monitor BP at home 2-3 times weekly, document, and provide log at future appointments -Counseled on diet and exercise extensively Recommended for patient to split atenolol to 5070mwice daily   Hyperlipidemia: (LDL goal < 100) -Controlled Lab Results  Component Value Date   LDLCALC 73 04/03/2020  -Current treatment: Rosuvastatin 5mg56m1 tablet daily  -Medications previously tried: simvastatin    -Current dietary patterns: reports to seldom eating red meat/ fried or fatty meals  -Current exercise habits: home cardio workouts 30-45 minutes a day - follows with a trainer at her community 1-2 times weekly -Educated on Cholesterol goals;  Benefits of statin for ASCVD risk reduction; Importance of limiting foods high in cholesterol; Exercise goal of 150 minutes per week; -Counseled on diet and exercise extensively Recommended to continue current medication  Chronic Pain (Goal: Pain control) -Controlled -Current treatment  Oxymorphone 10mg62m/2 tablet daily as needed - Prescribed by SarahSimeon Craft Diazepam 5mg -27mtablet daily as needed for spasms  Gabapentin 300mg -65mapsules nightly at bedtime (for leg cramps) APAP 650mg - 63mblets every  8 hours as needed  - taking no more than 4 tablets daily  -Medications previously tried: celecoxib, meloxicam, norco, oxycodone, percocet, tramadol, tizanidine  -Recommended to continue current medication Educated on sedating / safety concerns with use of multiple CNS depressing medications, patient is using responsbily, has been reducing her dosages as well - discussed possibility of replacing diazepam with a muscle relaxer that is non-habit forming such as methocarbamol, patient willing to try in the future   GERD (Goal: Prevention / control of acid reflux) -Controlled -Current treatment  Pantoprazole 38m - 1 tablet twice daily  -Medications previously tried: n/a  -Counseled on diet and exercise extensively Recommended to continue current medication  Osteoporosis (Goal Prevention of fractures / disease progression) -Not ideally controlled -Last DEXA Scan: 04/15/2020   T-Score right femoral neck: -1.8  T-Score left femoral neck: -2.8  T-Score lumbar spine: -0.5  T-Score forearm radius: -4.1 -Patient is a candidate for pharmacologic treatment due to T-Score < -2.5 in femoral neck -Current treatment  N/a -Medications previously tried:  vitmain D3 2000 units daily   -Recommend [phone] units of vitamin D daily. Recommend 1200 mg of calcium daily from dietary and supplemental sources. Recommend weight-bearing and muscle strengthening exercises for building and maintaining bone density. -Counseled on diet and exercise extensively Recommended for patient to start vitamin D and calcium supplementation - likely will be a candidate for - current estimated CrCl = 44.32 mL/min - would like to have repeated CMP/ BMP to ensure stable kidney function as it has fluctuated recently    OAB (Goal: Reduction of urinary) -Controlled -Current treatment  Myrbetriq 52m- 1 tablet daily  -Medications previously tried: n/a  -Recommended to continue current medication   Chronic Kidney Disease (Goal: Prevention of disease progression) -Controlled - Last eGFR: 48.13 mL/min -Current treatment  Kerendia 1072m 1 tablet daily - started 01/08/2021 Potassium Chloride 74m40m 1 tablet daily  Last potassium level: 4.1 mEq/L - has not been rechecked since starting kerendia -Medications previously tried: n/a  -Recommended to continue current medication  Depression/ Anxiety/ Insomnia (Goal: Promotion of mood / quality sleep) -Controlled -Current treatment: Paroxetine 40mg75m tablet twice daily   Trazodone 150mg 72mtablet at bedtime -Medications previously tried/failed: alrpazolam -PHQ9-2: 0 -Educated on Benefits of medication for symptom control Benefits of cognitive-behavioral therapy with or without medication -Recommended to continue current medication  Health Maintenance -Vaccine gaps: shingles, COVID vaccine, and flu shot (scheduled today) -Current therapy:  Multivitamin - 1 tablet daily  Miralax - 17g daily as needed  Retaine ophthalmic cream - applied to both eyes nightly at bedtime  Systane ophthalmic solution - 1 drop into both eyes daily as needed  Vitamin B12 1000mcg 5mtablet daily  Probiotic - 1 capsule daily  Fluticasone  50mcg n68m spray - 2 sprays into each nostril daily as needed  Olly daily energy gummies (vitamin b12, CoQ10, Goji Berry) - 1 gummy daily  Lions Mane - 1 capsule daily  Sumatriptan 100mg - 181mlet daily as needed for migraines - may repeat dose in 2 hours if migraine persists  -Educated on Herbal supplement research is limited and benefits usually cannot be proven Cost vs benefit of each product must be carefully weighed by individual consumer -Patient is satisfied with current therapy and denies issues -Counseled on diet and exercise extensively Recommended to continue current medication  Patient Goals/Self-Care Activities Patient will:  - take medications as prescribed check blood pressure 2-3 times weekly, document, and provide  at future appointments collaborate with provider on medication access solutions target a minimum of 150 minutes of moderate intensity exercise weekly engage in dietary modifications by reducing sodium intake to <2082m daily   Follow Up Plan: Telephone follow up appointment with care management team member scheduled for: 1 month The patient has been provided with contact information for the care management team and has been advised to call with any health related questions or concerns.

## 2021-02-07 NOTE — Telephone Encounter (Signed)
Submitted PA via cover-my-meds w/ (Key: [id number]). Waiting on coverage determination.Marland KitchenJohny Chess

## 2021-02-07 NOTE — Progress Notes (Signed)
High dose flu vacc given w/o any complications.

## 2021-02-07 NOTE — Progress Notes (Addendum)
Chronic Care Management Pharmacy Note  02/07/2021 Name:  Bianca Haley MRN:  [id number] DOB:  January 04, 1951  Summary: - Patient reports that she has recently started kerendia 77m daily - notes that she has not had any issues since starting - due for labs following initiation next week -Patient has not been monitoring BP at home, does have a BP cuff, but denies any feeling of hypo/hypertension -Reports that pain has been better controlled, is trying to reduce pain medications to help reduce fatigue - has reduced to 595mof oxymorphone daily, and reports she is only using 1-2 tablets of diazepam monthly for her muscle spasms  -No issues with insomnia, taking trazodone 15064m also taking gabapentin 900m48m night for her leg cramps - effective, notes that taking gabapentin throughout the day makes her fatigued - following with pain management provider for adjustments (SarJustine Nullecommendations/Changes made from today's visit: - recommending for patient to split atenolol dosing and take 50mg60m for more consistent BP coverage throughout the day- patient agreeable to start monitoring blood pressure 2-3 times weekly -discussed with patient about trial of a different medication for her muscle spasms instead of diazepam, was open to suggestion - would recommend methocarbamol 500mg 74maily as needed  -Patient due to follow up labs to check serum potassium since starting kerendia last month - patient agreeable to have labs completed and follow up  -Based on latest dexa scan, recommending for patient to start vitamin D3 / Calcium supplementation 1000 units / 1200mg d60m - will discuss possible initiation of bisphosphonate with next visit   Subjective: [name]70 y.o.33ear old female who is a primary patient of Plotnikov, AlekseiEvie LacksThe CCM team was consulted for assistance with disease management and care coordination needs.    Engaged with patient face to face for initial visit  in response to provider referral for pharmacy case management and/or care coordination services.   Consent to Services:  The patient was given the following information about Chronic Care Management services today, agreed to services, and gave verbal consent: 1. CCM service includes personalized support from designated clinical staff supervised by the primary care provider, including individualized plan of care and coordination with other care providers 2. 24/7 contact phone numbers for assistance for urgent and routine care needs. 3. Service will only be billed when office clinical staff spend 20 minutes or more in a month to coordinate care. 4. Only one practitioner may furnish and bill the service in a calendar month. 5.The patient may stop CCM services at any time (effective at the end of the month) by phone call to the office staff. 6. The patient will be responsible for cost sharing (co-pay) of up to 20% of the service fee (after annual deductible is met). Patient agreed to services and consent obtained.  Patient Care Team: Plotnikov, AlekseiEvie Lacks PCP - General (Internal Medicine) Alva, RRigoberto Noel Consulting Physician (Pulmonary Disease) HarkinsClydell Hakimnactive) as Consulting Physician (Anesthesiology) Olin, MParalee Cancel Consulting Physician (Orthopedic Surgery) Athar, Star Age Attending Physician (Neurology) EichmanReece Agar Consulting Physician (Pain Medicine) Lorielle Boehning,Delice Bison Darnelle Maffuccis [facility] (Pharmacist)  Recent office visits:  01/02/21-Aleksei V. Plotnikov, MD (PCP) 3 month follow up visit for anxiety and chronic pain. [facility]. Labs ordered. Follow up in 3 months. Continued diazepam prescription for anxiety   10/02/20-Aleksei V. Plotnikov, MD (PCP) 6  month follow up. Recommended lidocaine ointment on let knee    Recent consult visits:  11/21/20-[name] Ardis Rowan, [facility] Notes not available. 10/01/20-Scott MacDiarmid  (Urology) Notes not available. 09/23/20-[name] Ardis Rowan, [facility] (Neurosurgery) Notes not available. 08/22/20-Todd Meisinger (OBGYN) Notes not available. 08/07/20-[address] (OBGYN) Notes not available.   Hospital visits:  To ED 07/25/2020 - Altered mental status - evaluation of confusion - found to have AKI - believed to have encephalopathy secondary to AKI and narcotic/polypharmacy - discharged 07/27/2020  Objective:  Lab Results  Component Value Date   CREATININE 1.14 01/02/2021   BUN 17 01/02/2021   GFR 48.13 (L) 01/02/2021   GFRNONAA >60 07/27/2020   GFRAA >60 10/20/2018   NA 137 01/02/2021   K 4.1 01/02/2021   CALCIUM 9.5 01/02/2021   CO2 33 (H) 01/02/2021   GLUCOSE 99 01/02/2021    Lab Results  Component Value Date/Time   HGBA1C 6.1 06/23/2018 02:05 PM   HGBA1C 5.9 (H) 11/23/2017 11:06 AM   GFR 48.13 (L) 01/02/2021 02:07 PM   GFR 61.78 04/03/2020 12:02 PM    Last diabetic Eye exam:  No results found for: HMDIABEYEEXA  Last diabetic Foot exam:  No results found for: HMDIABFOOTEX   Lab Results  Component Value Date   CHOL 155 04/03/2020   HDL 61.40 04/03/2020   LDLCALC 73 04/03/2020   TRIG 102.0 04/03/2020   CHOLHDL 3 04/03/2020    Hepatic Function Latest Ref Rng & Units 01/02/2021 07/26/2020 07/25/2020  Total Protein 6.0 - 8.3 g/dL 6.9 7.2 8.0  Albumin 3.5 - 5.2 g/dL 4.1 3.4(L) 3.7  AST 0 - 37 U/L _0 ALT 0 - 35 U/L _1 Alk Phosphatase 39 - 117 U/L 81 83 93  Total Bilirubin 0.2 - 1.2 mg/dL 0.6 1.0 0.7  Bilirubin, Direct 0.0 - 0.3 mg/dL - - -    Lab Results  Component Value Date/Time   TSH 1.092 07/26/2020 02:09 AM   TSH 2.738 07/25/2020 07:00 PM   TSH 1.44 04/03/2020 12:02 PM   TSH 0.87 01/30/2019 02:50 PM    CBC Latest Ref Rng & Units 07/26/2020 07/25/2020 07/25/2020  WBC 4.0 - 10.5 K/uL 6.5 - 6.7  Hemoglobin 12.0 - 15.0 g/dL 14.6 13.6 14.5  Hematocrit 36.0 - 46.0 % 44.9 40.0 44.2  Platelets 150 - 400 K/uL 297 - 278    Lab Results   Component Value Date/Time   VD25OH 58.72 04/03/2020 12:02 PM   VD25OH 51.84 09/26/2015 11:49 AM    Clinical ASCVD: No  The 10-year ASCVD risk score (Arnett DK, et al., 2019) is: 19.2%   Values used to calculate the score:     Age: 70 years     Sex: Female     Is Non-Hispanic African American: No     Diabetic: No     Tobacco smoker: No     Systolic Blood Pressure: 329 mmHg     Is BP treated: Yes     HDL Cholesterol: 61.4 mg/dL     Total Cholesterol: 155 mg/dL    Depression screen [facility] 2/9 02/07/2021 07/31/2020 02/08/2017  Decreased Interest 0 0 0  Down, Depressed, Hopeless 0 0 0  PHQ - 2 Score 0 0 0  Some recent data might be hidden    Social History   Tobacco Use  Smoking Status Never  Smokeless Tobacco Never   BP Readings from Last 3 Encounters:  01/02/21 140/82  10/03/20 (!) 159/66  10/02/20 132/72  Pulse Readings from Last 3 Encounters:  01/02/21 (!) 59  10/03/20 64  10/02/20 (!) 58   Wt Readings from Last 3 Encounters:  01/02/21 139 lb 12.8 oz (63.4 kg)  10/02/20 149 lb 12.8 oz (67.9 kg)  07/31/20 153 lb (69.4 kg)   BMI Readings from Last 3 Encounters:  01/02/21 23.26 kg/m  10/02/20 24.93 kg/m  07/31/20 25.46 kg/m    Assessment/Interventions: Review of patient past medical history, allergies, medications, health status, including review of consultants reports, laboratory and other test data, was performed as part of comprehensive evaluation and provision of chronic care management services.   SDOH:  (Social Determinants of Health) assessments and interventions performed: Yes  SDOH Screenings   Alcohol Screen: Not on file  Depression (PHQ2-9): Low Risk    PHQ-2 Score: 0  Financial Resource Strain: Low Risk    Difficulty of Paying Living Expenses: Not hard at all  Food Insecurity: Not on file  Housing: Not on file  Physical Activity: Not on file  Social Connections: Not on file  Stress: Not on file  Tobacco Use: Low Risk    Smoking Tobacco Use:  Never   Smokeless Tobacco Use: Never  Transportation Needs: Not on file    CCM Care Plan  Allergies  Allergen Reactions   Penicillins Shortness Of Breath and Rash    Did it involve swelling of the face/tongue/throat, SOB, or low BP? No Did it involve sudden or severe rash/hives, skin peeling, or any reaction on the inside of your mouth or nose? Yes Did you need to seek medical attention at a hospital or doctor's office? No When did it last happen?      10 + years If all above answers are "NO", may proceed with cephalosporin use.     Morphine And Related Swelling    Sedation/Swelling described like edema/bloating/doesn't help the pain Morphine only; tolerates other opioids.    Cefepime Rash    Reaction while taking both cefepime and vancomycin at [facility] (after 3 days)   Vancomycin Rash    Reaction while taking both cefepime and vancomycin at [facility] (after 3 days)    Medications Reviewed Today     Reviewed by Tomasa Blase, [facility] (Pharmacist) on 02/07/21 at 1658  Med List Status: <None>   Medication Order Taking? Sig Documenting Provider Last Dose Status Informant  acetaminophen (TYLENOL) 650 MG CR tablet [id number] Yes Take 1,300 mg by mouth every 8 (eight) hours as needed for pain. [provider] Taking Active Self  albuterol (VENTOLIN HFA) 108 (90 Base) MCG/ACT inhaler [id number] Yes INHALE 2 PUFFS INTO THE LUNGS EVERY 4 HOURS AS NEEDED FOR WHEEZING OR SHORTNESS OF BREATH Parrett, Tammy S, NP Taking Active Self  atenolol (TENORMIN) 100 MG tablet [id number] Yes TAKE 1 TABLET(100 MG) BY MOUTH EVERY DAY  Patient taking differently: Take 50 mg by mouth 2 (two) times daily.   Plotnikov, [name], MD Taking Active Self  Calcium Carb-Cholecalciferol (CALCIUM PLUS VITAMIN D3) 600-500 MG-UNIT CAPS [id number] Yes Take 2 tablets by mouth daily. [provider]  Active   Carboxymethylcellulose Sodium (RETAINE [place] OP) [id number] Yes Place 1 application into both eyes at bedtime.  [provider] Taking Active Self  Cholecalciferol (VITAMIN D) 2000 units tablet [id number] Yes Take 2,000 Units by mouth at bedtime.  [provider] Taking Active Self  cyanocobalamin 1000 MCG tablet [id number] Yes Take 1,000 mcg by mouth daily. [provider] Taking Active Self  diazepam (VALIUM) 5 MG tablet [id number]  Yes Take 1 tablet (5 mg total) by mouth daily as needed for anxiety or muscle spasms. Plotnikov, [name], MD Taking Active   diltiazem (CARDIZEM CD) 180 MG 24 hr capsule [id number] Yes TAKE 1 CAPSULE(180 MG) BY MOUTH DAILY Plotnikov, [name], MD Taking Active Self  Finerenone (KERENDIA) 10 MG TABS [id number] Yes Take 10 mg by mouth daily. Plotnikov, [name], MD Taking Active   fluticasone (FLONASE) 50 MCG/ACT nasal spray [id number] Yes Place 2 sprays into both nostrils daily as needed for allergies or rhinitis. [provider] Taking Active Self  gabapentin (NEURONTIN) 300 MG capsule [id number]  Take 1 capsule (300 mg total) by mouth 3 (three) times daily for 30 days.  Patient taking differently: Take 900 mg by mouth at bedtime.   Arrien, Jimmy Picket, MD  Expired 10/17/18 2359            Med Note Caryn Section, KYLE A   Mon Oct 17, 2018 11:25 AM) Based on the pain level  Misc Natural Products (SUPER ENERGY PO) [id number] Yes Take 1 capsule by mouth daily. Vitamin b12 - 262mg + CoQ10 330m+ Goji Berry 10041mrovider, Historical, MD Taking Active   Multiple Vitamin (MULTIVITAMIN WITH MINERALS) TABS tablet [id number] Take 2 tablets by mouth daily.  [provider] Taking Active Self  OVER THE COUNTER MEDICATION [id number] Take 1 capsule by mouth daily. LioSurgical Arts Centerovider, Historical, MD Taking Active   oxymorphone (OPANA) 10 MG tablet [id number] Take 1 tablet (10 mg total) by mouth daily as needed for pain.  Patient taking differently: Take 5 mg by mouth daily as needed for pain.   SinThurnell LoseD Taking Active    pantoprazole (PROTONIX) 40 MG tablet [id number] TAKE 1 TABLET(40 MG) BY MOUTH TWICE DAILY BEFORE A MEAL  Patient taking differently: Take 40 mg by mouth 2 (two) times daily before a meal.   Plotnikov, AleEvie LacksD Taking Active   PARoxetine (PAXIL) 40 MG tablet [id number] TAKE 1 TABLET(40 MG) BY MOUTH TWICE DAILY Plotnikov, AleEvie LacksD Taking Active Self  Polyethyl Glycol-Propyl Glycol (SYSTANE OP) [id number] Place 1 drop into both eyes daily as needed (dry eyes). [provider] Taking Active Self  polyethylene glycol (MIRALAX / GLYCOLAX) packet [id number] Take 17 g by mouth 2 (two) times daily.  Patient taking differently: Take 17 g by mouth daily as needed (constipation). Mix in 8 oz liquid and drink   BabDanae OrleansA-C Taking Active Self  potassium chloride (KLOR-CON) 10 MEQ tablet [id number] TAKE 1 TABLET(10 MEQ) BY MOUTH DAILY  Patient taking differently: Take 10 mEq by mouth daily.   Plotnikov, AleEvie LacksD Taking Active   Probiotic CAPS [id number] Take 1 capsule by mouth daily. [provider] Taking Active Self  rosuvastatin (CRESTOR) 5 MG tablet [id number] TAKE 1 TABLET(5 MG) BY MOUTH DAILY Plotnikov, AleEvie LacksD Taking Active   SUMAtriptan (IMITREX) 100 MG tablet [id number] TAKE 1 TABLET BY MOUTH DAILY AS NEEDED FOR MIGRAINE. MAY REPEAT IN 2 HOURS IF HEADACHE PERSISTS OR RECURS Plotnikov, AleEvie LacksD Taking Active   traZODone (DESYREL) 150 MG tablet [id number] TAKE 1 TABLET BY MOUTH AT BEDTIME AS NEEDED FOR SLEEP Plotnikov, AleEvie LacksD Taking Active             Patient Active Problem List   Diagnosis Date Noted   Anxiety disorder 10/02/2020   Atherosclerosis of aorta ([place]6/05/2020   Actinic  keratosis 07/31/2020   ARF (acute renal failure) (HCC) 07/26/2020   Encephalopathy acute 07/26/2020   Acute encephalopathy 07/25/2020   DOE (dyspnea on exertion) 01/30/2019   Sleep disorder 01/30/2019   Dilated bile duct     Pancreatic duct dilated    Dilation of biliary tract    Hyperglycemia 06/23/2018   Upper airway cough syndrome 06/12/2017   Memory loss 03/12/2017   Left foot infection 02/08/2017   Deep incisional surgical site infection 11/09/2016   Allergic reaction caused by a drug 11/06/2016   Fever 09/16/2016   Body aches 09/16/2016   Cough 09/16/2016   Edema 09/15/2016   Urinary tract infection 08/14/2016   Cerumen impaction 06/11/2016   Posterior tibial tendon dysfunction (PTTD) of left lower extremity 04/13/2016   Dyslipidemia 09/25/2015   S/P left TKA 07/15/2015   S/P knee replacement 07/15/2015   Esophageal dysphagia 09/18/2014   GERD (gastroesophageal reflux disease) 09/04/2014   Chronic narcotic dependence ([place]) 09/04/2014   Gross hematuria 08/31/2014   Hemorrhagic cystitis 08/24/2014   Bilateral lower extremity edema 04/23/2014   Osteoarthritis of left knee 10/25/2013   Knee pain, left 10/13/2013   Left leg swelling 10/13/2013   Chronic neck and back pain 09/28/2013   Hypokalemia 09/28/2013   Abnormal CT scan, head 09/27/2013   Nocturnal hypoxia 73/71/0626   Diastolic dysfunction- grade I 02/24/2013   Recurrent pneumonia 02/21/2013   Chronic cough 01/04/2013   Contracture of palmar fascia (Dupuytren's) 08/04/2011   VENOUS INSUFFICIENCY, LEFT LEG 05/14/2010   CRI (chronic renal insufficiency), stage 3 (moderate) ([place]) 12/02/2009   INGUINAL PAIN, RIGHT 12/02/2009   UNSPECIFIED ANEMIA 11/28/2009   PULMONARY NODULE 05/10/2008   FIBROIDS, UTERUS 08/03/2007   MIGRAINE HEADACHE 08/03/2007   TINNITUS, LEFT 08/03/2007   Essential hypertension 08/03/2007   ENDOMETRIOSIS 08/03/2007   ALCOHOL ABUSE, HX OF 08/03/2007   MEASLES, HX OF 08/03/2007   Personal history of urinary disorder 08/03/2007    Immunization History  Administered Date(s) Administered   Fluad Quad(high Dose 65+) 01/30/2019, 04/03/2020, 02/07/2021   Influenza Split 02/27/2015   Influenza Whole 01/30/2009    Influenza, High Dose Seasonal PF 02/08/2017, 01/04/2018   Influenza, Seasonal, Injecte, Preservative Fre 02/27/2015   Influenza,inj,Quad PF,6+ Mos 01/26/2013, 01/26/2014, 02/21/2016   Influenza-Unspecified 01/26/2013, 01/26/2014, 02/21/2016, 02/09/2017   Moderna SARS-COV2 Booster Vaccination 04/05/2020   Moderna Sars-Covid-2 Vaccination 05/25/2019, 06/22/2019   Pneumococcal Conjugate-13 01/26/2013   Pneumococcal Polysaccharide-23 03/23/2013, 04/03/2020   Td 01/30/2009   Tdap 06/23/2018   Zoster Recombinat (Shingrix) 02/07/2018    Conditions to be addressed/monitored:  Hypertension, Hyperlipidemia, GERD, Chronic Kidney Disease, Depression, Osteoporosis, Overactive Bladder, and Chronic Pain   Care Plan : CCM Care Plan  Updates made by Tomasa Blase, RPH since 02/07/2021 12:00 AM     Problem: Hypertension, Hyperlipidemia, GERD, Chronic Kidney Disease, Depression, Osteoporosis, Overactive Bladder, and Chronic Pain   Priority: High  Onset Date: 02/07/2021     Long-Range Goal: Disease Management   Start Date: 02/07/2021  Expected End Date: 08/08/2021  This Visit's Progress: On track  Priority: High  Note:   Current Barriers:  Unable to independently monitor therapeutic efficacy  Pharmacist Clinical Goal(s):  Patient will achieve adherence to monitoring guidelines and medication adherence to achieve therapeutic efficacy maintain control of LDL and as evidenced by next lipid panel / BP logs  through collaboration with PharmD and provider.   Interventions: 1:1 collaboration with Plotnikov, [name], MD regarding development and update of comprehensive plan of care as evidenced by provider  attestation and co-signature Inter-disciplinary care team collaboration (see longitudinal plan of care) Comprehensive medication review performed; medication list updated in electronic medical record  Hypertension (BP goal <140/90) -Not ideally controlled -Current treatment: Diltiazem CD 24hr  capsule - 135m daily  Atenolol 1076m- 1 tablet once daily  -Medications previously tried: HCTZ, Metoprolol tartrate, maxzide  -Current home readings: n/a BP Readings from Last 3 Encounters:  01/02/21 140/82  10/03/20 (!) 159/66  10/02/20 132/72  -Current dietary habits: notes she does lightly salt her food, drinks 2 cups of coffee every AM -Current exercise habits: home cardio workouts 30-45 minutes a day - follows with a trainer at her community 1-2 times weekly -Denies hypotensive/hypertensive symptoms -Educated on BP goals and benefits of medications for prevention of heart attack, stroke and kidney damage; Daily salt intake goal < 2300 mg; Exercise goal of 150 minutes per week; Importance of home blood pressure monitoring; Proper BP monitoring technique; Symptoms of hypotension and importance of maintaining adequate hydration; -Counseled to monitor BP at home 2-3 times weekly, document, and provide log at future appointments -Counseled on diet and exercise extensively Recommended for patient to split atenolol to 5065mwice daily   Hyperlipidemia: (LDL goal < 100) -Controlled Lab Results  Component Value Date   LDLCALC 73 04/03/2020  -Current treatment: Rosuvastatin 5mg109m1 tablet daily  -Medications previously tried: simvastatin   -Current dietary patterns: reports to seldom eating red meat/ fried or fatty meals  -Current exercise habits: home cardio workouts 30-45 minutes a day - follows with a trainer at her community 1-2 times weekly -Educated on Cholesterol goals;  Benefits of statin for ASCVD risk reduction; Importance of limiting foods high in cholesterol; Exercise goal of 150 minutes per week; -Counseled on diet and exercise extensively Recommended to continue current medication  Chronic Pain (Goal: Pain control) -Controlled -Current treatment  Oxymorphone 10mg13m/2 tablet daily as needed - Prescribed by SarahSimeon Craft Diazepam 5mg -20mtablet daily as needed  for spasms  Gabapentin 300mg -41mapsules nightly at bedtime (for leg cramps) APAP 650mg - 74mblets every 8 hours as needed  - taking no more than 4 tablets daily  -Medications previously tried: celecoxib, meloxicam, norco, oxycodone, percocet, tramadol, tizanidine  -Recommended to continue current medication Educated on sedating / safety concerns with use of multiple CNS depressing medications, patient is using responsbily, has been reducing her dosages as well - discussed possibility of replacing diazepam with a muscle relaxer that is non-habit forming such as methocarbamol, patient willing to try in the future   GERD (Goal: Prevention / control of acid reflux) -Controlled -Current treatment  Pantoprazole 40mg - 180mlet twice daily  -Medications previously tried: n/a  -Counseled on diet and exercise extensively Recommended to continue current medication  Osteoporosis (Goal Prevention of fractures / disease progression) -Not ideally controlled -Last DEXA Scan: 04/15/2020   T-Score right femoral neck: -1.8  T-Score left femoral neck: -2.8  T-Score lumbar spine: -0.5  T-Score forearm radius: -4.1 -Patient is a candidate for pharmacologic treatment due to T-Score < -2.5 in femoral neck -Current treatment  N/a -Medications previously tried: vitmain D3 2000 units daily   -Recommend [phone] units of vitamin D daily. Recommend 1200 mg of calcium daily from dietary and supplemental sources. Recommend weight-bearing and muscle strengthening exercises for building and maintaining bone density. -Counseled on diet and exercise extensively Recommended for patient to start vitamin D and calcium supplementation - likely will be a candidate for -  current estimated CrCl = 44.32 mL/min - would like to have repeated CMP/ BMP to ensure stable kidney function as it has fluctuated recently    OAB (Goal: Reduction of urinary) -Controlled -Current treatment  Myrbetriq 20m - 1 tablet daily   -Medications previously tried: n/a  -Recommended to continue current medication   Chronic Kidney Disease (Goal: Prevention of disease progression) -Controlled - Last eGFR: 48.13 mL/min -Current treatment  Kerendia 139m- 1 tablet daily - started 01/08/2021 Potassium Chloride 1027m- 1 tablet daily  Last potassium level: 4.1 mEq/L - has not been rechecked since starting kerendia -Medications previously tried: n/a  -Recommended to continue current medication  Depression/ Anxiety/ Insomnia (Goal: Promotion of mood / quality sleep) -Controlled -Current treatment: Paroxetine 35m73m1 tablet twice daily   Trazodone 150mg55m tablet at bedtime -Medications previously tried/failed: alrpazolam -PHQ9-2: 0 -Educated on Benefits of medication for symptom control Benefits of cognitive-behavioral therapy with or without medication -Recommended to continue current medication  Health Maintenance -Vaccine gaps: shingles, COVID vaccine, and flu shot (scheduled today) -Current therapy:  Multivitamin - 1 tablet daily  Miralax - 17g daily as needed  Retaine ophthalmic cream - applied to both eyes nightly at bedtime  Systane ophthalmic solution - 1 drop into both eyes daily as needed  Vitamin B12 1000mcg77m tablet daily  Probiotic - 1 capsule daily  Fluticasone 50mcg 22ml spray - 2 sprays into each nostril daily as needed  [place] daily energy gummies (vitamin b12, CoQ10, Goji Berry) - 1 gummy daily  Lions M[employer] Autopsule daily  Sumatriptan 100mg - 32mblet daily as needed for migraines - may repeat dose in 2 hours if migraine persists  -Educated on Herbal supplement research is limited and benefits usually cannot be proven Cost vs benefit of each product must be carefully weighed by individual consumer -Patient is satisfied with current therapy and denies issues -Counseled on diet and exercise extensively Recommended to continue current medication  Patient Goals/Self-Care Activities Patient  will:  - take medications as prescribed check blood pressure 2-3 times weekly, document, and provide at future appointments collaborate with provider on medication access solutions target a minimum of 150 minutes of moderate intensity exercise weekly engage in dietary modifications by reducing sodium intake to <2000mg dai26m Follow Up Plan: Telephone follow up appointment with care management team member scheduled for: 1 month The patient has been provided with contact information for the care management team and has been advised to call with any health related questions or concerns.        Medication Assistance: None required.  Patient affirms current coverage meets needs. At this time   [id number] [phone] box? Yes Pt endorses 100% compliance  Care Plan and Follow Up Patient Decision:  Patient agrees to Care Plan and Follow-up.  Plan: Telephone follow up appointment with care management team member scheduled for:  1 month and The patient has been provided with contact information for the care management team and has been advised to call with any health related questions or concerns.   Kelon Easom C Tomasa BlaseClinical Pharmacist, [place] examination/treatment/procedure(s) were performed by non-physician practitioner and as supervising physician I was immediately available for consultation/collaboration.  I agree  with above. Lew Dawes, MD

## 2021-02-10 DIAGNOSIS — Z8616 Personal history of COVID-19: Secondary | ICD-10-CM | POA: Diagnosis not present

## 2021-02-11 DIAGNOSIS — M542 Cervicalgia: Secondary | ICD-10-CM | POA: Diagnosis not present

## 2021-02-11 NOTE — Telephone Encounter (Signed)
Rec'd PA back med was denied. It states " Exception to PA request for Carrington Clamp for treatment of chronic Kidney disease is not associated w/ Type 2 diabetes. No alternative was given.Marland KitchenJohny Chess

## 2021-02-12 NOTE — Telephone Encounter (Signed)
Noted. Thx.

## 2021-02-13 NOTE — Telephone Encounter (Signed)
Joelene Millin called from [employer] [phone] , hours open 9am-6pm  They sent over an appeal letter on 10.10.22, if the MD would like to appeal or get their help, please call the number above

## 2021-02-13 NOTE — Telephone Encounter (Signed)
No appeal is needed.Marland KitchenJohny Haley

## 2021-02-17 DIAGNOSIS — M542 Cervicalgia: Secondary | ICD-10-CM | POA: Diagnosis not present

## 2021-02-18 ENCOUNTER — Other Ambulatory Visit: Payer: Self-pay | Admitting: Internal Medicine

## 2021-02-20 ENCOUNTER — Telehealth: Payer: Self-pay

## 2021-02-20 NOTE — Telephone Encounter (Signed)
Insurance is no willing to cover Finerenone. Patient request a different medication.

## 2021-02-21 NOTE — Telephone Encounter (Signed)
There is no alternatives in this family of drugs.  We will discuss other options at the next visit.  Thanks

## 2021-02-24 DIAGNOSIS — Z20828 Contact with and (suspected) exposure to other viral communicable diseases: Secondary | ICD-10-CM | POA: Diagnosis not present

## 2021-02-24 DIAGNOSIS — M542 Cervicalgia: Secondary | ICD-10-CM | POA: Diagnosis not present

## 2021-02-25 NOTE — Telephone Encounter (Signed)
Called pt there was no answer LMOM w/MD response../lmb 

## 2021-03-03 DIAGNOSIS — N183 Chronic kidney disease, stage 3 unspecified: Secondary | ICD-10-CM | POA: Diagnosis not present

## 2021-03-03 DIAGNOSIS — I10 Essential (primary) hypertension: Secondary | ICD-10-CM

## 2021-03-03 DIAGNOSIS — Z8616 Personal history of COVID-19: Secondary | ICD-10-CM | POA: Diagnosis not present

## 2021-03-05 ENCOUNTER — Telehealth: Payer: Self-pay | Admitting: Pulmonary Disease

## 2021-03-05 MED ORDER — LEVOFLOXACIN 500 MG PO TABS: 500.0000 mg | ORAL_TABLET | Freq: Every day | ORAL | 0 refills | Status: DC

## 2021-03-05 NOTE — Telephone Encounter (Signed)
LMTCB

## 2021-03-05 NOTE — Telephone Encounter (Signed)
Called and spoke to patient. She voiced understanding. Stated she will call if not better after the levaquin. Levaquin sent in to pharmacy. Nothing further needed at this time.

## 2021-03-05 NOTE — Telephone Encounter (Signed)
Pt states she believes she has either bronchitis or pnumonia. LOV was 05/06/20, however her husbadn is "in the hospital dying" and she "doesnt have the time to take care of herself." Pharmacy is [facility] on [address]. Would like an anitbiotic if possible as she is wheezing and crackling.

## 2021-03-05 NOTE — Telephone Encounter (Signed)
Called and spoke with pt who states she started out having cold symptoms which first began 3-4 days ago.  Pt said that she feels like her symptoms are moving into her chest and states that she is now coughing getting up yellow phlegm and also has some mild wheeze and crackling in her chest. Pt does have some increased SOB with exertion and also has some tightness in her chest. Pt denies any complaints of fever, has not checked temp but does not feel like she has a fever.  Asked pt if she has done a covid test and pt said she tested Monday 10/31 which was negative.  Pt has not had to use her rescue inhaler any. Pt said that she has tried OTC delsym and also tylenol extra strength.  Pt is requesting to have an abx called in for her as she believes this may either be bronchitis or pna with her history. Stated to pt that we might need to schedule her for a video visit but she wants to see if meds could go ahead and be prescribed for her instead.  Pt said that her husband is currently in the hospital and is not doing well.  Dr. Elsworth Soho, please advise.

## 2021-03-06 DIAGNOSIS — L309 Dermatitis, unspecified: Secondary | ICD-10-CM | POA: Diagnosis not present

## 2021-03-09 ENCOUNTER — Other Ambulatory Visit: Payer: Self-pay

## 2021-03-09 ENCOUNTER — Inpatient Hospital Stay (HOSPITAL_COMMUNITY)
Admission: EM | Admit: 2021-03-09 | Discharge: 2021-03-12 | DRG: 189 | Disposition: A | Discharge status: Home / Self Care | Payer: Medicare Other | Source: Ambulatory Visit | Attending: Internal Medicine | Admitting: Internal Medicine

## 2021-03-09 ENCOUNTER — Encounter (HOSPITAL_COMMUNITY): Payer: Self-pay | Admitting: Emergency Medicine

## 2021-03-09 ENCOUNTER — Emergency Department (HOSPITAL_COMMUNITY): Payer: Medicare Other

## 2021-03-09 DIAGNOSIS — I5032 Chronic diastolic (congestive) heart failure: Secondary | ICD-10-CM | POA: Diagnosis present

## 2021-03-09 DIAGNOSIS — Z825 Family history of asthma and other chronic lower respiratory diseases: Secondary | ICD-10-CM | POA: Diagnosis not present

## 2021-03-09 DIAGNOSIS — I10 Essential (primary) hypertension: Secondary | ICD-10-CM | POA: Diagnosis not present

## 2021-03-09 DIAGNOSIS — J189 Pneumonia, unspecified organism: Secondary | ICD-10-CM | POA: Diagnosis present

## 2021-03-09 DIAGNOSIS — K219 Gastro-esophageal reflux disease without esophagitis: Secondary | ICD-10-CM | POA: Diagnosis present

## 2021-03-09 DIAGNOSIS — G43909 Migraine, unspecified, not intractable, without status migrainosus: Secondary | ICD-10-CM | POA: Diagnosis present

## 2021-03-09 DIAGNOSIS — R0602 Shortness of breath: Secondary | ICD-10-CM | POA: Diagnosis not present

## 2021-03-09 DIAGNOSIS — Z20822 Contact with and (suspected) exposure to covid-19: Secondary | ICD-10-CM | POA: Diagnosis present

## 2021-03-09 DIAGNOSIS — E785 Hyperlipidemia, unspecified: Secondary | ICD-10-CM | POA: Diagnosis present

## 2021-03-09 DIAGNOSIS — Z8249 Family history of ischemic heart disease and other diseases of the circulatory system: Secondary | ICD-10-CM

## 2021-03-09 DIAGNOSIS — I110 Hypertensive heart disease with heart failure: Secondary | ICD-10-CM | POA: Diagnosis present

## 2021-03-09 DIAGNOSIS — J9601 Acute respiratory failure with hypoxia: Secondary | ICD-10-CM | POA: Diagnosis present

## 2021-03-09 DIAGNOSIS — E860 Dehydration: Secondary | ICD-10-CM | POA: Diagnosis present

## 2021-03-09 DIAGNOSIS — J205 Acute bronchitis due to respiratory syncytial virus: Secondary | ICD-10-CM | POA: Diagnosis present

## 2021-03-09 DIAGNOSIS — E871 Hypo-osmolality and hyponatremia: Secondary | ICD-10-CM | POA: Diagnosis present

## 2021-03-09 DIAGNOSIS — R0902 Hypoxemia: Secondary | ICD-10-CM | POA: Diagnosis not present

## 2021-03-09 DIAGNOSIS — F419 Anxiety disorder, unspecified: Secondary | ICD-10-CM | POA: Diagnosis present

## 2021-03-09 DIAGNOSIS — J9621 Acute and chronic respiratory failure with hypoxia: Principal | ICD-10-CM | POA: Diagnosis present

## 2021-03-09 DIAGNOSIS — R051 Acute cough: Secondary | ICD-10-CM

## 2021-03-09 DIAGNOSIS — I700 Atherosclerosis of aorta: Secondary | ICD-10-CM | POA: Diagnosis not present

## 2021-03-09 DIAGNOSIS — Z8701 Personal history of pneumonia (recurrent): Secondary | ICD-10-CM | POA: Diagnosis not present

## 2021-03-09 DIAGNOSIS — R059 Cough, unspecified: Secondary | ICD-10-CM | POA: Diagnosis not present

## 2021-03-09 LAB — BLOOD GAS, ARTERIAL
Acid-Base Excess: 4.2 mmol/L — ABNORMAL HIGH (ref 0.0–2.0)
Bicarbonate: 29.9 mmol/L — ABNORMAL HIGH (ref 20.0–28.0)
FIO2: 28
O2 Saturation: 94.9 %
Patient temperature: 98.6
pCO2 arterial: 51.7 mmHg — ABNORMAL HIGH (ref 32.0–48.0)
pH, Arterial: 7.38 (ref 7.350–7.450)
pO2, Arterial: 68.7 mmHg — ABNORMAL LOW (ref 83.0–108.0)

## 2021-03-09 LAB — CBC WITH DIFFERENTIAL/PLATELET
Abs Immature Granulocytes: 0.01 10*3/uL (ref 0.00–0.07)
Basophils Absolute: 0 10*3/uL (ref 0.0–0.1)
Basophils Relative: 1 %
Eosinophils Absolute: 0.1 10*3/uL (ref 0.0–0.5)
Eosinophils Relative: 3 %
HCT: 39.6 % (ref 36.0–46.0)
Hemoglobin: 13.6 g/dL (ref 12.0–15.0)
Immature Granulocytes: 0 %
Lymphocytes Relative: 35 %
Lymphs Abs: 1.1 10*3/uL (ref 0.7–4.0)
MCH: 30.4 pg (ref 26.0–34.0)
MCHC: 34.3 g/dL (ref 30.0–36.0)
MCV: 88.6 fL (ref 80.0–100.0)
Monocytes Absolute: 0.4 10*3/uL (ref 0.1–1.0)
Monocytes Relative: 12 %
Neutro Abs: 1.6 10*3/uL — ABNORMAL LOW (ref 1.7–7.7)
Neutrophils Relative %: 49 %
Platelets: 175 10*3/uL (ref 150–400)
RBC: 4.47 MIL/uL (ref 3.87–5.11)
RDW: 13.2 % (ref 11.5–15.5)
WBC: 3.2 10*3/uL — ABNORMAL LOW (ref 4.0–10.5)
nRBC: 0 % (ref 0.0–0.2)

## 2021-03-09 LAB — BASIC METABOLIC PANEL WITH GFR
Anion gap: 6 (ref 5–15)
BUN: 12 mg/dL (ref 8–23)
CO2: 31 mmol/L (ref 22–32)
Calcium: 9.1 mg/dL (ref 8.9–10.3)
Chloride: 92 mmol/L — ABNORMAL LOW (ref 98–111)
Creatinine, Ser: 0.68 mg/dL (ref 0.44–1.00)
GFR, Estimated: >60 mL/min
Glucose, Bld: 104 mg/dL — ABNORMAL HIGH (ref 70–99)
Potassium: 4.4 mmol/L (ref 3.5–5.1)
Sodium: 129 mmol/L — ABNORMAL LOW (ref 135–145)

## 2021-03-09 LAB — C-REACTIVE PROTEIN: CRP: 0.7 mg/dL (ref <1.0)

## 2021-03-09 LAB — RESP PANEL BY RT-PCR (FLU A&B, COVID) ARPGX2
Influenza A by PCR: NEGATIVE
Influenza B by PCR: NEGATIVE
SARS Coronavirus 2 by RT PCR: NEGATIVE

## 2021-03-09 LAB — BRAIN NATRIURETIC PEPTIDE: B Natriuretic Peptide: 86.4 pg/mL (ref 0.0–100.0)

## 2021-03-09 LAB — TROPONIN I (HIGH SENSITIVITY): Troponin I (High Sensitivity): 4 ng/L (ref <18)

## 2021-03-09 LAB — D-DIMER, QUANTITATIVE: D-Dimer, Quant: <0.27 ug/mL-FEU (ref 0.00–0.50)

## 2021-03-09 MED ORDER — VITAMIN D 25 MCG (1000 UNIT) PO TABS
2000.0000 [IU] | ORAL_TABLET | Freq: Every day | ORAL | Status: DC
  Administered 2021-03-09 – 2021-03-11 (×3): 2000 [IU] via ORAL
  Filled 2021-03-09 (×3): qty 2

## 2021-03-09 MED ORDER — MELATONIN 5 MG PO TABS
10.0000 mg | ORAL_TABLET | Freq: Every evening | ORAL | Status: DC | PRN
  Administered 2021-03-11 (×2): 10 mg via ORAL
  Filled 2021-03-09 (×2): qty 2

## 2021-03-09 MED ORDER — DILTIAZEM HCL ER COATED BEADS 180 MG PO CP24
180.0000 mg | ORAL_CAPSULE | Freq: Every morning | ORAL | Status: DC
  Administered 2021-03-10 – 2021-03-12 (×3): 180 mg via ORAL
  Filled 2021-03-09 (×3): qty 1

## 2021-03-09 MED ORDER — ENOXAPARIN SODIUM 40 MG/0.4ML IJ SOSY
40.0000 mg | PREFILLED_SYRINGE | INTRAMUSCULAR | Status: DC
  Administered 2021-03-10 – 2021-03-11 (×2): 40 mg via SUBCUTANEOUS
  Filled 2021-03-09 (×3): qty 0.4

## 2021-03-09 MED ORDER — TRAZODONE HCL 50 MG PO TABS
150.0000 mg | ORAL_TABLET | Freq: Every evening | ORAL | Status: DC | PRN
  Administered 2021-03-10: 150 mg via ORAL
  Filled 2021-03-09: qty 1

## 2021-03-09 MED ORDER — POLYETHYLENE GLYCOL 3350 17 G PO PACK: 17.0000 g | PACK | Freq: Every day | ORAL | Status: DC | PRN

## 2021-03-09 MED ORDER — MIRABEGRON ER 25 MG PO TB24
50.0000 mg | ORAL_TABLET | Freq: Every morning | ORAL | Status: DC
  Administered 2021-03-10 – 2021-03-12 (×3): 50 mg via ORAL
  Filled 2021-03-09 (×3): qty 2

## 2021-03-09 MED ORDER — ONDANSETRON HCL 4 MG PO TABS: 4.0000 mg | ORAL_TABLET | Freq: Four times a day (QID) | ORAL | Status: DC | PRN

## 2021-03-09 MED ORDER — METHYLPREDNISOLONE SODIUM SUCC 125 MG IJ SOLR
125.0000 mg | Freq: Once | INTRAMUSCULAR | Status: AC
  Administered 2021-03-09: 125 mg via INTRAVENOUS
  Filled 2021-03-09: qty 2

## 2021-03-09 MED ORDER — SODIUM CHLORIDE 0.9 % IV SOLN: INTRAVENOUS | Status: DC

## 2021-03-09 MED ORDER — ACETAMINOPHEN 650 MG RE SUPP: 650.0000 mg | Freq: Four times a day (QID) | RECTAL | Status: DC | PRN

## 2021-03-09 MED ORDER — GABAPENTIN 400 MG PO CAPS
1200.0000 mg | ORAL_CAPSULE | Freq: Every day | ORAL | Status: DC
  Administered 2021-03-09 – 2021-03-11 (×3): 1200 mg via ORAL
  Filled 2021-03-09: qty 4
  Filled 2021-03-09 (×2): qty 3

## 2021-03-09 MED ORDER — VITAMIN B-12 1000 MCG PO TABS
1000.0000 ug | ORAL_TABLET | Freq: Every day | ORAL | Status: DC
  Administered 2021-03-10 – 2021-03-12 (×3): 1000 ug via ORAL
  Filled 2021-03-09 (×3): qty 1

## 2021-03-09 MED ORDER — ALBUTEROL SULFATE (2.5 MG/3ML) 0.083% IN NEBU
2.5000 mg | INHALATION_SOLUTION | RESPIRATORY_TRACT | Status: DC | PRN
  Administered 2021-03-10: 2.5 mg via RESPIRATORY_TRACT

## 2021-03-09 MED ORDER — MORPHINE SULFATE 15 MG PO TABS
15.0000 mg | ORAL_TABLET | ORAL | Status: DC | PRN
  Administered 2021-03-10 – 2021-03-12 (×3): 15 mg via ORAL
  Filled 2021-03-09 (×3): qty 1

## 2021-03-09 MED ORDER — ATENOLOL 50 MG PO TABS
100.0000 mg | ORAL_TABLET | Freq: Every morning | ORAL | Status: DC
  Administered 2021-03-10 – 2021-03-12 (×3): 100 mg via ORAL
  Filled 2021-03-09: qty 2
  Filled 2021-03-09: qty 1
  Filled 2021-03-09: qty 2

## 2021-03-09 MED ORDER — PANTOPRAZOLE SODIUM 40 MG PO TBEC
40.0000 mg | DELAYED_RELEASE_TABLET | Freq: Two times a day (BID) | ORAL | Status: DC
  Administered 2021-03-10 – 2021-03-12 (×5): 40 mg via ORAL
  Filled 2021-03-09 (×5): qty 1

## 2021-03-09 MED ORDER — ROSUVASTATIN CALCIUM 5 MG PO TABS
5.0000 mg | ORAL_TABLET | Freq: Every morning | ORAL | Status: DC
  Administered 2021-03-10 – 2021-03-12 (×3): 5 mg via ORAL
  Filled 2021-03-09 (×3): qty 1

## 2021-03-09 MED ORDER — ALBUTEROL SULFATE (2.5 MG/3ML) 0.083% IN NEBU
5.0000 mg | INHALATION_SOLUTION | Freq: Once | RESPIRATORY_TRACT | Status: AC
  Administered 2021-03-09: 5 mg via RESPIRATORY_TRACT
  Filled 2021-03-09: qty 6

## 2021-03-09 MED ORDER — FLUTICASONE PROPIONATE 50 MCG/ACT NA SUSP: 2.0000 | Freq: Every day | NASAL | Status: DC | PRN

## 2021-03-09 MED ORDER — SUMATRIPTAN SUCCINATE 50 MG PO TABS
100.0000 mg | ORAL_TABLET | ORAL | Status: DC | PRN
  Administered 2021-03-10: 100 mg via ORAL
  Filled 2021-03-09 (×3): qty 2

## 2021-03-09 MED ORDER — ACETAMINOPHEN 325 MG PO TABS: 650.0000 mg | ORAL_TABLET | Freq: Four times a day (QID) | ORAL | Status: DC | PRN

## 2021-03-09 MED ORDER — ONDANSETRON HCL 4 MG/2ML IJ SOLN: 4.0000 mg | Freq: Four times a day (QID) | INTRAMUSCULAR | Status: DC | PRN

## 2021-03-09 MED ORDER — DIAZEPAM 5 MG PO TABS
5.0000 mg | ORAL_TABLET | Freq: Every evening | ORAL | Status: DC | PRN
  Administered 2021-03-10 – 2021-03-11 (×3): 5 mg via ORAL
  Filled 2021-03-09 (×3): qty 1

## 2021-03-09 MED ORDER — PAROXETINE HCL 20 MG PO TABS
40.0000 mg | ORAL_TABLET | Freq: Two times a day (BID) | ORAL | Status: DC
  Administered 2021-03-09 – 2021-03-12 (×6): 40 mg via ORAL
  Filled 2021-03-09 (×6): qty 2

## 2021-03-09 MED ORDER — CYCLOSPORINE 0.05 % OP EMUL
1.0000 [drp] | Freq: Two times a day (BID) | OPHTHALMIC | Status: DC
  Administered 2021-03-10 – 2021-03-12 (×4): 1 [drp] via OPHTHALMIC
  Filled 2021-03-09 (×7): qty 30

## 2021-03-09 NOTE — Assessment & Plan Note (Signed)
   Patient presenting with several day history of progressively worsening shortness of breath despite taking a course of levofloxacin for suspected community-acquired pneumonia over the past several days  On presentation patient is still exhibiting hypoxia requiring submental oxygen via nasal cannula with a persisting left lower lobe infiltrate on chest x-ray  Atypical presentation with complete absence of other SIRS criteria or other evidence of ongoing infection.  Will work-up further with procalcitonin, CRP and D-dimer.  If D-dimer is elevated we will proceed with CT angiogram of the chest.  If D-dimer is elevated will proceed with CTA chest.  If D-dimer is normal we will obtain CT chest with contrast  Continue to provide submental oxygen with target oxygen saturations between 93 to 96%.  As needed bronchodilator therapy for associated wheezing  Obtaining ABG on room air to confirm hypoxia and determine severity  Obtaining blood cultures, sputum culture, respiratory PCR panel

## 2021-03-09 NOTE — Assessment & Plan Note (Signed)
Continuing home regimen of daily PPI therapy.  

## 2021-03-09 NOTE — Assessment & Plan Note (Signed)
•   No clinical evidence of cardiogenic volume overload ° °

## 2021-03-09 NOTE — ED Provider Notes (Signed)
[facility] DEPT Provider Note   CSN: [id number] Arrival date & time: 03/09/21  1431     History Chief Complaint  Patient presents with   Cough    Bianca Haley is a 70 y.o. female.  HPI  70 year old female past medical history of HTN, HLD, migraines, GERD, nighttime hypoxia who wears 2 L at night presents the emergency department with ongoing cough and shortness of breath.  Patient states that she was diagnosed with pneumonia as an outpatient by her pulmonologist, completed her prescription of Levaquin.  She has not gotten any better.  Endorses fatigue and productive cough of yellow phlegm.  Denies any active chest pain, swelling of her lower extremities.  No GI symptoms.Uses albuterol inhaler at home as needed for chronic cough.  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   Hemorrhoids    History of colon polyps    History of endometriosis    History of hiatal hernia    History of uterine fibroid    History of UTI    Hypertension    Memory loss    Metabolic encephalopathy    History of: resolved   Migraine    Recurrent pneumonia 07/2018   Seasonal allergies    Vitamin D deficiency    Wears glasses     Patient Active Problem List   Diagnosis Date Noted   Anxiety disorder 10/02/2020   Atherosclerosis of aorta ([place]) 10/02/2020   Actinic keratosis 07/31/2020   ARF (acute renal failure) ([place]) 07/26/2020   Encephalopathy acute 07/26/2020   Acute encephalopathy 07/25/2020   DOE (dyspnea on exertion) 01/30/2019   Sleep disorder 01/30/2019   Dilated bile duct    Pancreatic duct dilated    Dilation of biliary tract     Hyperglycemia 06/23/2018   Upper airway cough syndrome 06/12/2017   Memory loss 03/12/2017   Left foot infection 02/08/2017   Deep incisional surgical site infection 11/09/2016   Allergic reaction caused by a drug 11/06/2016   Fever 09/16/2016   Body aches 09/16/2016   Cough 09/16/2016   Edema 09/15/2016   Urinary tract infection 08/14/2016   Cerumen impaction 06/11/2016   Posterior tibial tendon dysfunction (PTTD) of left lower extremity 04/13/2016   Dyslipidemia 09/25/2015   S/P left TKA 07/15/2015   S/P knee replacement 07/15/2015   Esophageal dysphagia 09/18/2014   GERD (gastroesophageal reflux disease) 09/04/2014   Chronic narcotic dependence ([place]) 09/04/2014   Gross hematuria 08/31/2014   Hemorrhagic cystitis 08/24/2014   Bilateral lower extremity edema 04/23/2014   Osteoarthritis of left knee 10/25/2013   Knee pain, left 10/13/2013   Left leg swelling 10/13/2013   Chronic neck and back pain 09/28/2013   Hypokalemia 09/28/2013   Abnormal CT scan, head 09/27/2013   Nocturnal hypoxia 72/01/4708   Diastolic dysfunction- grade I 02/24/2013   Recurrent pneumonia 02/21/2013   Chronic cough 01/04/2013   Contracture of palmar fascia (Dupuytren's) 08/04/2011   VENOUS INSUFFICIENCY, LEFT LEG 05/14/2010   CRI (chronic renal insufficiency), stage 3 (moderate) ([place]) 12/02/2009   INGUINAL  PAIN, RIGHT 12/02/2009   UNSPECIFIED ANEMIA 11/28/2009   PULMONARY NODULE 05/10/2008   FIBROIDS, UTERUS 08/03/2007   MIGRAINE HEADACHE 08/03/2007   TINNITUS, LEFT 08/03/2007   Essential hypertension 08/03/2007   ENDOMETRIOSIS 08/03/2007   ALCOHOL ABUSE, HX OF 08/03/2007   MEASLES, HX OF 08/03/2007   Personal history of urinary disorder 08/03/2007    Past Surgical History:  Procedure Laterality Date   ABDOMINAL HYSTERECTOMY     BACK SURGERY     BIOPSY  10/20/2018   Procedure: BIOPSY;  Surgeon: [name], MD;  Location: [facility];  Service: Endoscopy;;   BLADDER SUSPENSION      COLONOSCOPY  02/16/2008   normal   DENTAL SURGERY     ESOPHAGOGASTRODUODENOSCOPY (EGD) WITH PROPOFOL N/A 10/20/2018   Procedure: ESOPHAGOGASTRODUODENOSCOPY (EGD) WITH PROPOFOL;  Surgeon: [name], MD;  Location: [facility];  Service: Endoscopy;  Laterality: N/A;   EUS N/A 10/20/2018   Procedure: UPPER ENDOSCOPIC ULTRASOUND (EUS) RADIAL;  Surgeon: [name], MD;  Location: [facility];  Service: Endoscopy;  Laterality: N/A;   FOOT SURGERY Left    x3   laparoscopy     for evaluation of endometriosis   TOTAL KNEE ARTHROPLASTY Left 07/15/2015   Procedure: TOTAL LEFT KNEE ARTHROPLASTY;  Surgeon: Paralee Cancel, MD;  Location: [facility];  Service: Orthopedics;  Laterality: Left;   UPPER GI ENDOSCOPY       OB History     Gravida  2   Para  2   Term  2   Preterm      AB  0   Living  2      SAB  0   IAB  0   Ectopic  0   Multiple  0   Live Births              Family History  Problem Relation Age of Onset   Emphysema Mother    Heart disease Mother 64       MI    Social History   Tobacco Use   Smoking status: Never   Smokeless tobacco: Never  Vaping Use   Vaping Use: Never used  Substance Use Topics   Alcohol use: No    Alcohol/week: 0.0 standard drinks    Comment: As of 07/2018, it is been several years since she drank any alcohol.   Drug use: No    Home Medications Prior to Admission medications   Medication Sig Start Date End Date Taking? Authorizing Provider  acetaminophen (TYLENOL) 650 MG CR tablet Take 1,300 mg by mouth every 8 (eight) hours as needed for pain.    [provider]  albuterol (VENTOLIN HFA) 108 (90 Base) MCG/ACT inhaler INHALE 2 PUFFS INTO THE LUNGS EVERY 4 HOURS AS NEEDED FOR WHEEZING OR SHORTNESS OF BREATH 05/09/20   Parrett, Tammy S, NP  atenolol (TENORMIN) 100 MG tablet TAKE 1 TABLET(100 MG) BY MOUTH EVERY DAY Patient taking differently: Take 50 mg by mouth 2 (two) times daily. 04/23/20   Plotnikov, [name], MD   Calcium Carb-Cholecalciferol (CALCIUM PLUS VITAMIN D3) 600-500 MG-UNIT CAPS Take 2 tablets by mouth daily.    [provider]  Carboxymethylcellulose Sodium (RETAINE CMC OP) Place 1 application into both eyes at bedtime.    [provider]  Cholecalciferol (VITAMIN D) 2000 units tablet Take 2,000 Units by mouth at bedtime.     [provider]  cyanocobalamin 1000 MCG tablet Take 1,000 mcg by mouth daily.  [provider]  diazepam (VALIUM) 5 MG tablet Take 1 tablet (5 mg total) by mouth daily as needed for anxiety or muscle spasms. 10/02/20   Plotnikov, [name], MD  diltiazem (CARDIZEM CD) 180 MG 24 hr capsule TAKE 1 CAPSULE(180 MG) BY MOUTH DAILY 04/15/20   Plotnikov, [name], MD  Finerenone (KERENDIA) 10 MG TABS Take 10 mg by mouth daily. 01/02/21   Plotnikov, [name], MD  fluticasone (FLONASE) 50 MCG/ACT nasal spray Place 2 sprays into both nostrils daily as needed for allergies or rhinitis.    [provider]  gabapentin (NEURONTIN) 300 MG capsule Take 1 capsule (300 mg total) by mouth 3 (three) times daily for 30 days. Patient taking differently: Take 900 mg by mouth at bedtime. 07/13/18 10/17/18  Arrien, Jimmy Picket, MD  levofloxacin (LEVAQUIN) 500 MG tablet Take 1 tablet (500 mg total) by mouth daily for 5 days. 03/05/21 03/10/21  Rigoberto Noel, MD  Misc Natural Products (SUPER ENERGY PO) Take 1 capsule by mouth daily. Vitamin b12 - 23mcg + CoQ10 30mg  + Goji Berry 100mg     [provider]  Multiple Vitamin (MULTIVITAMIN WITH MINERALS) TABS tablet Take 2 tablets by mouth daily.     [provider]  OVER THE COUNTER MEDICATION Take 1 capsule by mouth daily. [facility]    [provider]  oxymorphone (OPANA) 10 MG tablet Take 1 tablet (10 mg total) by mouth daily as needed for pain. Patient taking differently: Take 5 mg by mouth daily as needed for pain. 07/27/20   Thurnell Lose, MD  pantoprazole (PROTONIX) 40 MG  tablet TAKE 1 TABLET(40 MG) BY MOUTH TWICE DAILY BEFORE A MEAL Patient taking differently: Take 40 mg by mouth 2 (two) times daily before a meal. 04/23/20   Plotnikov, [name], MD  PARoxetine (PAXIL) 40 MG tablet TAKE 1 TABLET(40 MG) BY MOUTH TWICE DAILY 04/25/20   Plotnikov, [name], MD  Polyethyl Glycol-Propyl Glycol (SYSTANE OP) Place 1 drop into both eyes daily as needed (dry eyes).    [provider]  polyethylene glycol (MIRALAX / GLYCOLAX) packet Take 17 g by mouth 2 (two) times daily. Patient taking differently: Take 17 g by mouth daily as needed (constipation). Mix in 8 oz liquid and drink 07/16/15   Danae Orleans, PA-C  potassium chloride (KLOR-CON) 10 MEQ tablet TAKE 1 TABLET(10 MEQ) BY MOUTH DAILY Patient taking differently: Take 10 mEq by mouth daily. 06/12/20   Plotnikov, [name], MD  Probiotic CAPS Take 1 capsule by mouth daily.    [provider]  rosuvastatin (CRESTOR) 5 MG tablet TAKE 1 TABLET(5 MG) BY MOUTH DAILY 05/30/20   Plotnikov, [name], MD  SUMAtriptan (IMITREX) 100 MG tablet TAKE 1 TABLET BY MOUTH DAILY AS NEEDED FOR MIGRAINE. MAY REPEAT IN 2 HOURS IF HEADACHE PERSISTS OR RECURS 02/19/21   Plotnikov, [name], MD  traZODone (DESYREL) 150 MG tablet TAKE 1 TABLET BY MOUTH AT BEDTIME AS NEEDED FOR SLEEP 10/21/20   Plotnikov, [name], MD    Allergies    Penicillins, Morphine and related, Cefepime, and Vancomycin  Review of Systems   Review of Systems  Constitutional:  Positive for appetite change and fatigue. Negative for chills and fever.  HENT:  Negative for congestion.   Eyes:  Negative for visual disturbance.  Respiratory:  Positive for cough and shortness of breath.   Cardiovascular:  Negative for chest pain and leg swelling.  Gastrointestinal:  Negative for abdominal pain, diarrhea and  vomiting.  Genitourinary:  Negative for dysuria.  Skin:  Negative for rash.  Neurological:  Negative for headaches.   Physical Exam Updated Vital  Signs BP (!) 176/80   Pulse 61   Temp 98.7 F (37.1 C) (Oral)   Resp 18   Ht 5\' 5"  (1.651 m)   Wt 64.4 kg   SpO2 95%   BMI 23.63 kg/m   Physical Exam Vitals and nursing note reviewed.  Constitutional:      Appearance: Normal appearance. She is ill-appearing. She is not diaphoretic.  HENT:     Head: Normocephalic.     Mouth/Throat:     Mouth: Mucous membranes are moist.  Cardiovascular:     Rate and Rhythm: Normal rate.  Pulmonary:     Effort: Pulmonary effort is normal.     Breath sounds: Wheezing and rales present.  Abdominal:     Palpations: Abdomen is soft.     Tenderness: There is no abdominal tenderness.  Skin:    General: Skin is warm.  Neurological:     Mental Status: She is alert and oriented to person, place, and time. Mental status is at baseline.  Psychiatric:        Mood and Affect: Mood normal.    ED Results / Procedures / Treatments   Labs (all labs ordered are listed, but only abnormal results are displayed) Labs Reviewed  RESP PANEL BY RT-PCR (FLU A&B, COVID) [id number]  BASIC METABOLIC PANEL  BRAIN NATRIURETIC PEPTIDE  CBC WITH DIFFERENTIAL/PLATELET  TROPONIN I (HIGH SENSITIVITY)    EKG None  Radiology DG Chest 2 View  Result Date: 03/09/2021 CLINICAL DATA:  Shortness of breath and cough for 4 days. EXAM: CHEST - 2 VIEW COMPARISON:  07/25/2020 FINDINGS: Heart size is within normal limits. Mild pulmonary hyperinflation is again seen. New band like opacity is seen in the medial left lower lobe, which may be due to atelectasis or infiltrate. Right lung remains clear. No evidence of pleural effusion. IMPRESSION: New atelectasis versus infiltrate in medial left lower lobe. Electronically Signed   By: Marlaine Hind M.D.   On: 03/09/2021 16:25    Procedures .Critical Care Performed by: Lorelle Gibbs, DO Authorized by: Lorelle Gibbs, DO   Critical care provider statement:    Critical care time (minutes):  45   Critical care time was exclusive  of:  Separately billable procedures and treating other patients   Critical care was necessary to treat or prevent imminent or life-threatening deterioration of the following conditions:  Respiratory failure   Critical care was time spent personally by me on the following activities:  Ordering and performing treatments and interventions, ordering and review of laboratory studies, ordering and review of radiographic studies, re-evaluation of patient's condition, review of old charts, examination of patient, evaluation of patient's response to treatment and pulse oximetry   I assumed direction of critical care for this patient from another provider in my specialty: no     Care discussed with: admitting provider     Medications Ordered in ED Medications  albuterol (PROVENTIL) (2.5 MG/3ML) 0.083% nebulizer solution 5 mg (has no administration in time range)  methylPREDNISolone sodium succinate (SOLU-MEDROL) 125 mg/2 mL injection 125 mg (125 mg Intravenous Given 03/09/21 1847)    ED Course  I have reviewed the triage vital signs and the nursing notes.  Pertinent labs & imaging results that were available during my care of the patient were reviewed by me and considered in my medical decision making (  see chart for details).    MDM Rules/Calculators/A&P                           70 year old female presents the emergency department with ongoing productive cough and shortness of breath.  Just completed an outpatient course of Levaquin for pneumonia without any improvement.  On arrival was 81% on room air, comfortable and saturating well on 2 L nasal cannula.  Blood work is reassuring.  Cardiac work-up is negative.  Chest x-ray reiterates concern for left-sided infiltrate/pneumonia.  Patient has failed outpatient therapy and is currently requiring supplemental oxygen.  Plan for admission, IV antibiotics and further care.  Patients evaluation and results requires admission for further treatment and care.  Patient agrees with admission plan, offers no new complaints and is stable/unchanged at time of admit.  Final Clinical Impression(s) / ED Diagnoses Final diagnoses:  None    Rx / DC Orders ED Discharge Orders     None        Lorelle Gibbs, DO 03/09/21 2040

## 2021-03-09 NOTE — Progress Notes (Signed)
ABG collected and send down to lab for analysis. Lab called.  

## 2021-03-09 NOTE — H&P (Signed)
History and Physical    Bianca Haley HQI:[id number] DOB: May 04, 1951 DOA: 03/09/2021  PCP: [name], MD  Patient coming from: Home / Urgent Care Clinic   Chief Complaint:  Chief Complaint  Patient presents with   Cough     HPI:    70 year old female with past medical history of hypertension, diastolic congestive heart failure (Echo 07/2018 EF greater than 65%),  anxiety disorder, migraine headaches peripheral neuropathy, gastroesophageal reflux disease, hyperlipidemia who presented with complaints of cough from a local urgent care clinic due to concerns for incompletely treated pneumonia.  Patient explains that approximately 4 days ago she began to develop cough, productive with tan-colored sputum.  This is associated with wheezing and shortness of breath.  Shortness of breath is moderate in intensity, worse with exertion and improved with rest.  Patient denies any associated fevers, chest pain, recent travel, sick contacts or contact with confirmed COVID-19 infection.  Patient initially contacted her pulmonologist at the onset of her symptoms who prescribed her with a course of oral levofloxacin.  Patient states that she took this levofloxacin 500 mg p.o. daily as instructed but did not experience any improvement in symptoms.  Due to patient's persisting symptoms patient presented to a local urgent care clinic earlier today and during that evaluation chest x-ray revealed concern for persisting pneumonia despite oral antibiotic therapy.  The patient was then sent to [facility] emergency department for further evaluation.  Upon evaluation in the emergency department patient was found to be hypoxic on arrival with initial saturations in the mid 80s requiring initiation of supplemental oxygen via nasal cannula at 2 liters per minute.  Chest x-ray was performed revealing evidence of an infiltrate in the left lower lobe initial work-up was negative for COVID-19 and influenza.   The hospitalist group was then called to have the patient for admission to the hospital.   Review of Systems:   Review of Systems  Respiratory:  Positive for cough, sputum production, shortness of breath and wheezing.   All other systems reviewed and are negative.  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   Hemorrhoids    History of colon polyps    History of endometriosis    History of hiatal hernia    History of uterine fibroid    History of UTI    Hypertension    Memory loss    Metabolic encephalopathy    History of: resolved   Migraine    Recurrent pneumonia 07/2018   Seasonal allergies    Vitamin D deficiency    Wears glasses     Past Surgical History:  Procedure Laterality Date   ABDOMINAL HYSTERECTOMY     BACK SURGERY     BIOPSY  10/20/2018   Procedure: BIOPSY;  Surgeon: [name], MD;  Location: [facility];  Service: Endoscopy;;   BLADDER SUSPENSION     COLONOSCOPY  02/16/2008   normal   DENTAL SURGERY     ESOPHAGOGASTRODUODENOSCOPY (EGD) WITH PROPOFOL N/A 10/20/2018   Procedure: ESOPHAGOGASTRODUODENOSCOPY (EGD) WITH PROPOFOL;  Surgeon: [name], MD;  Location: [facility];  Service: Endoscopy;  Laterality: N/A;   EUS N/A 10/20/2018   Procedure: UPPER ENDOSCOPIC ULTRASOUND (EUS) RADIAL;  Surgeon: [name], MD;  Location: [facility];  Service: Endoscopy;  Laterality: N/A;   FOOT SURGERY Left    x3   laparoscopy     for evaluation of endometriosis   TOTAL KNEE ARTHROPLASTY Left 07/15/2015   Procedure: TOTAL LEFT KNEE ARTHROPLASTY;  Surgeon: Paralee Cancel, MD;  Location: [facility];  Service: Orthopedics;  Laterality: Left;    UPPER GI ENDOSCOPY       reports that she has never smoked. She has never used smokeless tobacco. She reports that she does not drink alcohol and does not use drugs.  Allergies  Allergen Reactions   Penicillins Shortness Of Breath and Rash    Did it involve swelling of the face/tongue/throat, SOB, or low BP? No Did it involve sudden or severe rash/hives, skin peeling, or any reaction on the inside of your mouth or nose? Yes Did you need to seek medical attention at a hospital or doctor's office? No When did it last happen?      10 + years If all above answers are "NO", may proceed with cephalosporin use.     Morphine And Related Swelling    Sedation/Swelling described like edema/bloating/doesn't help the pain Morphine only; tolerates other opioids.    Cefepime Rash    Reaction while taking both cefepime and vancomycin at [facility] (after 3 days)   Vancomycin Rash    Reaction while taking both cefepime and vancomycin at [facility] (after 3 days)    Family History  Problem Relation Age of Onset   Emphysema Mother    Heart disease Mother 9       MI     Prior to Admission medications   Medication Sig Start Date End Date Taking? Authorizing Provider  albuterol (VENTOLIN HFA) 108 (90 Base) MCG/ACT inhaler INHALE 2 PUFFS INTO THE LUNGS EVERY 4 HOURS AS NEEDED FOR WHEEZING OR SHORTNESS OF BREATH Patient taking differently: Inhale 2 puffs into the lungs every 4 (four) hours as needed for wheezing or shortness of breath. 05/09/20  Yes Parrett, Tammy S, NP  atenolol (TENORMIN) 100 MG tablet TAKE 1 TABLET(100 MG) BY MOUTH EVERY DAY Patient taking differently: Take 100 mg by mouth every morning. 04/23/20  Yes Plotnikov, [name], MD  Calcium Carb-Cholecalciferol (CALCIUM PLUS VITAMIN D3) 600-500 MG-UNIT CAPS Take 2 tablets by mouth daily.   Yes [provider]  Cholecalciferol (VITAMIN D) 2000 units tablet Take 2,000 Units by mouth at bedtime.    Yes [provider]  cyanocobalamin  1000 MCG tablet Take 1,000 mcg by mouth daily. Vitamin b12   Yes [provider]  diazepam (VALIUM) 5 MG tablet Take 1 tablet (5 mg total) by mouth daily as needed for anxiety or muscle spasms. Patient taking differently: Take 5 mg by mouth at bedtime as needed for anxiety or muscle spasms. 10/02/20  Yes Plotnikov, [name], MD  diltiazem (CARDIZEM CD) 180 MG 24 hr capsule TAKE 1 CAPSULE(180 MG) BY MOUTH DAILY Patient taking differently: Take 180 mg by mouth every morning. 04/15/20  Yes Plotnikov, [name], MD  acetaminophen (TYLENOL) 650 MG CR tablet Take 1,300 mg by mouth every 8 (eight) hours as needed for pain.    [provider]  Finerenone (KERENDIA) 10 MG TABS Take 10 mg by mouth daily. 01/02/21   Plotnikov, [name], MD  fluticasone (FLONASE) 50 MCG/ACT nasal spray Place 2 sprays into both nostrils daily as needed for allergies or rhinitis.    [provider]  gabapentin (NEURONTIN) 300 MG capsule Take 1 capsule (300 mg total) by mouth 3 (three) times daily for 30 days. Patient taking differently: Take 900 mg by mouth at bedtime. 07/13/18 10/17/18  Arrien, Jimmy Picket, MD  levofloxacin (LEVAQUIN) 500 MG tablet Take 1 tablet (500 mg total) by mouth daily for 5 days. 03/05/21 03/10/21  Rigoberto Noel, MD  Misc Natural Products (SUPER ENERGY PO) Take 1 capsule by mouth daily. Vitamin b12 - 215mcg + CoQ10 30mg  + Goji Berry 100mg     [provider]  Multiple Vitamin (MULTIVITAMIN WITH MINERALS) TABS tablet Take 2 tablets by mouth daily.     [provider]  OVER THE COUNTER MEDICATION Take 1 capsule by mouth daily. [facility]    [provider]  oxymorphone (OPANA) 10 MG tablet Take 1 tablet (10 mg total) by mouth daily as needed for pain. Patient taking differently: Take 5 mg by mouth daily as needed for pain. 07/27/20   Thurnell Lose, MD  pantoprazole (PROTONIX) 40 MG tablet TAKE 1 TABLET(40 MG) BY MOUTH TWICE DAILY BEFORE A MEAL Patient  taking differently: Take 40 mg by mouth 2 (two) times daily before a meal. 04/23/20   Plotnikov, [name], MD  PARoxetine (PAXIL) 40 MG tablet TAKE 1 TABLET(40 MG) BY MOUTH TWICE DAILY 04/25/20   Plotnikov, [name], MD  Polyethyl Glycol-Propyl Glycol (SYSTANE OP) Place 1 drop into both eyes daily as needed (dry eyes).    [provider]  polyethylene glycol (MIRALAX / GLYCOLAX) packet Take 17 g by mouth 2 (two) times daily. Patient taking differently: Take 17 g by mouth daily as needed (constipation). Mix in 8 oz liquid and drink 07/16/15   Danae Orleans, PA-C  potassium chloride (KLOR-CON) 10 MEQ tablet TAKE 1 TABLET(10 MEQ) BY MOUTH DAILY Patient taking differently: Take 10 mEq by mouth daily. 06/12/20   Plotnikov, [name], MD  Probiotic CAPS Take 1 capsule by mouth daily.    [provider]  rosuvastatin (CRESTOR) 5 MG tablet TAKE 1 TABLET(5 MG) BY MOUTH DAILY 05/30/20   Plotnikov, [name], MD  SUMAtriptan (IMITREX) 100 MG tablet TAKE 1 TABLET BY MOUTH DAILY AS NEEDED FOR MIGRAINE. MAY REPEAT IN 2 HOURS IF HEADACHE PERSISTS OR RECURS 02/19/21   Plotnikov, [name], MD  traZODone (DESYREL) 150 MG tablet TAKE 1 TABLET BY MOUTH AT BEDTIME AS NEEDED FOR SLEEP 10/21/20   Plotnikov, [name], MD  valACYclovir (VALTREX) 500 MG tablet Take 500 mg by mouth See admin instructions. Take one tablet (500 mg) by mouth twice daily for 3-5 days as needed for outbreaks 03/06/21   [provider]    Physical Exam: Vitals:   03/09/21 1930 03/09/21 1945 03/09/21 2000 03/09/21 2015  BP: 140/90 (!) 162/75 (!) 180/127 (!) 178/69  Pulse: 66 67 71 72  Resp: (!) 29 11 18 19   Temp:      TempSrc:      SpO2: 95% 92% 91% 98%  Weight:      Height:        Constitutional: Awake alert and oriented x3, no associated distress.   Skin: no rashes, no lesions, poor skin turgor noted. Eyes: Pupils are equally reactive to light.  No evidence of scleral icterus or conjunctival pallor.  ENMT:  Dry mucous membranes noted.  Posterior pharynx clear of any  exudate or lesions.   Neck: normal, supple, no masses, no thyromegaly.  No evidence of jugular venous distension.   Respiratory: Scattered rhonchi bilaterally with notable rales in the left base.  Occasional mild intermittent expiratory wheezing noted.  Some increased respiratory effort without accessory muscle use.  Cardiovascular: Regular rate and rhythm, no murmurs / rubs / gallops. No extremity edema. 2+ pedal pulses. No carotid bruits.  Chest:   Nontender without crepitus or deformity.   Back:   Nontender without crepitus or deformity. Abdomen: Abdomen is soft and nontender.  No evidence of intra-abdominal masses.  Positive bowel sounds noted in all quadrants.   Musculoskeletal: No joint deformity upper and lower extremities. Good ROM, no contractures. Normal muscle tone.  Neurologic: CN 2-12 grossly intact. Sensation intact.  Patient moving all 4 extremities spontaneously.  Patient is following all commands.  Patient is responsive to verbal stimuli.   Psychiatric: Patient exhibits normal mood with appropriate affect.  Patient seems to possess insight as to their current situation.     Labs on Admission: I have personally reviewed following labs and imaging studies -   CBC: Recent Labs  Lab 03/09/21 1838  WBC 3.2*  NEUTROABS 1.6*  HGB 13.6  HCT 39.6  MCV 88.6  PLT 716   Basic Metabolic Panel: Recent Labs  Lab 03/09/21 1838  NA 129*  K 4.4  CL 92*  CO2 31  GLUCOSE 104*  BUN 12  CREATININE 0.68  CALCIUM 9.1   GFR: Estimated Creatinine Clearance: 57.2 mL/min (by C-G formula based on SCr of 0.68 mg/dL). Liver Function Tests: No results for input(s): AST, ALT, ALKPHOS, BILITOT, PROT, ALBUMIN in the last 168 hours. No results for input(s): LIPASE, AMYLASE in the last 168 hours. No results for input(s): AMMONIA in the last 168 hours. Coagulation Profile: No results for input(s): INR, PROTIME in the last 168  hours. Cardiac Enzymes: No results for input(s): CKTOTAL, CKMB, CKMBINDEX, TROPONINI in the last 168 hours. BNP (last 3 results) No results for input(s): PROBNP in the last 8760 hours. HbA1C: No results for input(s): HGBA1C in the last 72 hours. CBG: No results for input(s): GLUCAP in the last 168 hours. Lipid Profile: No results for input(s): CHOL, HDL, LDLCALC, TRIG, CHOLHDL, LDLDIRECT in the last 72 hours. Thyroid Function Tests: No results for input(s): TSH, T4TOTAL, FREET4, T3FREE, THYROIDAB in the last 72 hours. Anemia Panel: No results for input(s): VITAMINB12, FOLATE, FERRITIN, TIBC, IRON, RETICCTPCT in the last 72 hours. Urine analysis:    Component Value Date/Time   COLORURINE AMBER (A) 07/25/2020 2214   APPEARANCEUR CLOUDY (A) 07/25/2020 2214   LABSPEC 1.023 07/25/2020 2214   PHURINE 5.0 07/25/2020 2214   GLUCOSEU NEGATIVE 07/25/2020 2214   GLUCOSEU NEGATIVE 04/03/2020 1202   HGBUR NEGATIVE 07/25/2020 2214   BILIRUBINUR NEGATIVE 07/25/2020 2214   KETONESUR 20 (A) 07/25/2020 2214   PROTEINUR 100 (A) 07/25/2020 2214   UROBILINOGEN 0.2 04/03/2020 1202   NITRITE NEGATIVE 07/25/2020 2214   LEUKOCYTESUR NEGATIVE 07/25/2020 2214    Radiological Exams on Admission - Personally Reviewed: DG Chest 2 View  Result Date: 03/09/2021 CLINICAL DATA:  Shortness of breath and cough for 4 days. EXAM: CHEST - 2 VIEW COMPARISON:  07/25/2020 FINDINGS: Heart size is within normal limits. Mild pulmonary hyperinflation is again seen. New band like opacity is seen in the medial left lower lobe, which may be due to atelectasis or infiltrate. Right lung remains clear. No evidence of pleural effusion. IMPRESSION: New atelectasis versus infiltrate in  medial left lower lobe. Electronically Signed   By: Marlaine Hind M.D.   On: 03/09/2021 16:25    EKG: Personally reviewed.  Rhythm is normal sinus rhythm with heart rate of 62 bpm.  No dynamic ST segment changes appreciated.  Assessment/Plan  *  Acute on chronic respiratory failure with hypoxia [facility]) Patient presenting with several day history of progressively worsening shortness of breath despite taking a course of levofloxacin for suspected community-acquired pneumonia over the past several days On presentation patient is still exhibiting hypoxia requiring submental oxygen via nasal cannula with a persisting left lower lobe infiltrate on chest x-ray Atypical presentation with complete absence of other SIRS criteria or other evidence of ongoing infection. Will work-up further with procalcitonin, CRP and D-dimer.  If D-dimer is elevated we will proceed with CT angiogram of the chest. If D-dimer is elevated will proceed with CTA chest.  If D-dimer is normal we will obtain CT chest with contrast Continue to provide submental oxygen with target oxygen saturations between 93 to 96%. As needed bronchodilator therapy for associated wheezing Obtaining ABG on room air to confirm hypoxia and determine severity Obtaining blood cultures, sputum culture, respiratory PCR panel  Pneumonia of left lower lobe due to infectious organism Possible left lower lobe pneumonia that has failed outpatient oral antibiotic therapy Remainder of assessment and plan as above.  Hyponatremia Patient presenting with a mild hyponatremia, presumably multifactorial secondary to volume depletion and ongoing use of Finerenone  Temporarily holding Finerenone Gentle intravenous hydration with normal saline Monitoring sodium levels with serial chemistry If sodium fails to promptly correct we will expand work-up of hyponatremia.  GERD without esophagitis Continuing home regimen of daily PPI therapy.   Essential hypertension Resume patients home regimen of oral antihypertensives with exception of holding patient's home regimen of Finerenone due to hyponatremia.  Continuing home regimen of atenolol and diazepam. Titrate antihypertensive regimen as necessary to achieve  adequate BP control PRN intravenous antihypertensives for excessively elevated blood pressure    Chronic diastolic CHF (congestive heart failure) (HCC) No clinical evidence of cardiogenic volume overload      Code Status:  Full code  code status decision has been confirmed with: patient Family Communication: deferred   Status is: Inpatient  Remains inpatient appropriate because: Hypoxia with concern for acute hypoxic respiratory failure secondary to suspected persisting left lower lobe pneumonia with failure of outpatient oral antibiotic therapy requiring inpatient work-up with additional blood work and radiographic imaging, supplemental oxygen, bronchodilator therapy and  continued intravenous antibiotic therapy.        Vernelle Emerald MD [facility] Pager [phone]  If 7PM-7AM, please contact night-coverage [email or web address] Use universal [place] password for that web site. If you do not have the password, please call the hospital operator.  03/09/2021, 10:00 PM

## 2021-03-09 NOTE — ED Provider Notes (Signed)
Emergency Medicine Provider Triage Evaluation Note  Bianca Haley , a 70 y.o. female  was evaluated in triage.  Pt complains of cough x3 to 4 days.  Patient has been on Levaquin for pneumonia, shortness of breath is worsened today.  She is having to sleep with pillows and sitting upright, usually on 2 L of oxygen at night but not during the day.  On 81% on room air in triage.  Also endorses some chest pressure that started about 3 or 4 days ago as well..  Review of Systems  Positive: Chest pressure, shortness of breath, cough Negative:   Physical Exam  BP (!) 152/75 (BP Location: Left Arm)   Pulse 80   Temp 98.7 F (37.1 C) (Oral)   Resp 20   SpO2 96%  Gen:   Awake, no distress   Resp:  Normal effort  MSK:   Moves extremities without difficulty  Other:  81% on room air, 96% on 2 L supplemental oxygen.  Medical Decision Making  Medically screening exam initiated at 3:43 PM.  Appropriate orders placed.  [name] was informed that the remainder of the evaluation will be completed by another provider, this initial triage assessment does not replace that evaluation, and the importance of remaining in the ED until their evaluation is complete.  Needs room, hypoxia work-up.   Sherrill Raring, PA-C 03/09/21 [place]    Varney Biles, MD 03/09/21 1941

## 2021-03-09 NOTE — Assessment & Plan Note (Signed)
   Possible left lower lobe pneumonia that has failed outpatient oral antibiotic therapy  Remainder of assessment and plan as above.

## 2021-03-09 NOTE — Assessment & Plan Note (Signed)
.   Resume patients home regimen of oral antihypertensives with exception of holding patient's home regimen of Finerenone due to hyponatremia.  Continuing home regimen of atenolol and diazepam. . Titrate antihypertensive regimen as necessary to achieve adequate BP control . PRN intravenous antihypertensives for excessively elevated blood pressure

## 2021-03-09 NOTE — ED Triage Notes (Signed)
Patient here from [facility] reporting pnemonia +. Cough x4 days. Levaquin with no relief. Productive cough.

## 2021-03-09 NOTE — Assessment & Plan Note (Signed)
   Patient presenting with a mild hyponatremia, presumably multifactorial secondary to volume depletion and ongoing use of Finerenone   Temporarily holding Finerenone  Gentle intravenous hydration with normal saline  Monitoring sodium levels with serial chemistry  If sodium fails to promptly correct we will expand work-up of hyponatremia.

## 2021-03-10 ENCOUNTER — Encounter (HOSPITAL_COMMUNITY): Payer: Self-pay

## 2021-03-10 ENCOUNTER — Inpatient Hospital Stay (HOSPITAL_COMMUNITY): Payer: Medicare Other

## 2021-03-10 DIAGNOSIS — J9621 Acute and chronic respiratory failure with hypoxia: Secondary | ICD-10-CM | POA: Diagnosis not present

## 2021-03-10 DIAGNOSIS — I10 Essential (primary) hypertension: Secondary | ICD-10-CM | POA: Diagnosis not present

## 2021-03-10 DIAGNOSIS — I5032 Chronic diastolic (congestive) heart failure: Secondary | ICD-10-CM | POA: Diagnosis not present

## 2021-03-10 DIAGNOSIS — K219 Gastro-esophageal reflux disease without esophagitis: Secondary | ICD-10-CM | POA: Diagnosis not present

## 2021-03-10 LAB — CBC WITH DIFFERENTIAL/PLATELET
Abs Immature Granulocytes: 0.02 10*3/uL (ref 0.00–0.07)
Basophils Absolute: 0 10*3/uL (ref 0.0–0.1)
Basophils Relative: 0 %
Eosinophils Absolute: 0 10*3/uL (ref 0.0–0.5)
Eosinophils Relative: 0 %
HCT: 42.7 % (ref 36.0–46.0)
Hemoglobin: 14.5 g/dL (ref 12.0–15.0)
Immature Granulocytes: 0 %
Lymphocytes Relative: 11 %
Lymphs Abs: 0.9 10*3/uL (ref 0.7–4.0)
MCH: 30 pg (ref 26.0–34.0)
MCHC: 34 g/dL (ref 30.0–36.0)
MCV: 88.4 fL (ref 80.0–100.0)
Monocytes Absolute: 0.2 10*3/uL (ref 0.1–1.0)
Monocytes Relative: 3 %
Neutro Abs: 6.9 10*3/uL (ref 1.7–7.7)
Neutrophils Relative %: 86 %
Platelets: 233 10*3/uL (ref 150–400)
RBC: 4.83 MIL/uL (ref 3.87–5.11)
RDW: 13.1 % (ref 11.5–15.5)
WBC: 8 10*3/uL (ref 4.0–10.5)
nRBC: 0 % (ref 0.0–0.2)

## 2021-03-10 LAB — COMPREHENSIVE METABOLIC PANEL
ALT: 28 U/L (ref 0–44)
AST: 42 U/L — ABNORMAL HIGH (ref 15–41)
Albumin: 4.6 g/dL (ref 3.5–5.0)
Alkaline Phosphatase: 79 U/L (ref 38–126)
Anion gap: 10 (ref 5–15)
BUN: 20 mg/dL (ref 8–23)
CO2: 29 mmol/L (ref 22–32)
Calcium: 10 mg/dL (ref 8.9–10.3)
Chloride: 91 mmol/L — ABNORMAL LOW (ref 98–111)
Creatinine, Ser: 0.86 mg/dL (ref 0.44–1.00)
GFR, Estimated: >60 mL/min (ref >60)
Glucose, Bld: 177 mg/dL — ABNORMAL HIGH (ref 70–99)
Potassium: 4.7 mmol/L (ref 3.5–5.1)
Sodium: 130 mmol/L — ABNORMAL LOW (ref 135–145)
Total Bilirubin: 0.5 mg/dL (ref 0.3–1.2)
Total Protein: 8.3 g/dL — ABNORMAL HIGH (ref 6.5–8.1)

## 2021-03-10 LAB — RESPIRATORY PANEL BY PCR

## 2021-03-10 LAB — MAGNESIUM: Magnesium: 2 mg/dL (ref 1.7–2.4)

## 2021-03-10 LAB — PROCALCITONIN: Procalcitonin: <0.1 ng/mL

## 2021-03-10 MED ORDER — IOHEXOL 350 MG/ML SOLN
75.0000 mL | Freq: Once | INTRAVENOUS | Status: AC | PRN
  Administered 2021-03-10: 75 mL via INTRAVENOUS

## 2021-03-10 MED ORDER — HYDROCODONE BIT-HOMATROP MBR 5-1.5 MG/5ML PO SOLN
5.0000 mL | Freq: Two times a day (BID) | ORAL | Status: DC | PRN
  Administered 2021-03-10 – 2021-03-12 (×4): 5 mL via ORAL
  Filled 2021-03-10 (×4): qty 5

## 2021-03-10 MED ORDER — ALBUTEROL SULFATE (2.5 MG/3ML) 0.083% IN NEBU
2.5000 mg | INHALATION_SOLUTION | RESPIRATORY_TRACT | Status: AC
  Administered 2021-03-10 (×2): 2.5 mg via RESPIRATORY_TRACT
  Filled 2021-03-10 (×2): qty 3

## 2021-03-10 MED ORDER — IPRATROPIUM-ALBUTEROL 0.5-2.5 (3) MG/3ML IN SOLN
3.0000 mL | Freq: Four times a day (QID) | RESPIRATORY_TRACT | Status: DC | PRN
  Administered 2021-03-12: 3 mL via RESPIRATORY_TRACT
  Filled 2021-03-10: qty 3

## 2021-03-10 MED ORDER — HYDRALAZINE HCL 20 MG/ML IJ SOLN: 10.0000 mg | Freq: Four times a day (QID) | INTRAMUSCULAR | Status: DC | PRN

## 2021-03-10 MED ORDER — PREDNISONE 20 MG PO TABS
40.0000 mg | ORAL_TABLET | Freq: Every day | ORAL | Status: DC
  Administered 2021-03-10 – 2021-03-12 (×3): 40 mg via ORAL
  Filled 2021-03-10 (×3): qty 2

## 2021-03-10 NOTE — ED Notes (Signed)
Pt went back upstairs to visit husband who. Pt told nurse she was being discharged and patient is not being discharged. Patient will be returning to the ED and wait for admission bed.

## 2021-03-10 NOTE — ED Notes (Signed)
Pt. Went up stairs to see husband who is declining.

## 2021-03-10 NOTE — Plan of Care (Signed)
  Problem: Education: Goal: Knowledge of General Education information will improve Description: Including pain rating scale, medication(s)/side effects and non-pharmacologic comfort measures Outcome: Progressing   Problem: Clinical Measurements: Goal: Will remain free from infection Outcome: Progressing Goal: Diagnostic test results will improve Outcome: Progressing Goal: Respiratory complications will improve Outcome: Progressing   

## 2021-03-10 NOTE — ED Notes (Signed)
ED TO INPATIENT HANDOFF REPORT  Name/Age/Gender Bianca Haley 70 y.o. female  Code Status    Code Status Orders  (From admission, onward)           Start     Ordered   03/09/21 2139  Full code  Continuous        03/09/21 2141           Code Status History     Date Active Date Inactive Code Status Order ID Comments User Context   07/26/2020 0110 07/27/2020 1851 Full Code [id number]  Rise Patience, MD Inpatient   07/12/2018 1949 07/13/2018 1743 Full Code [id number]  Guilford Shi, MD ED   09/01/2014 0455 09/04/2014 1531 Full Code [id number]  Toy Baker, MD Inpatient   09/27/2013 1450 09/29/2013 1848 Full Code [id number]  Eugenie Filler, MD Inpatient   02/21/2013 1309 02/24/2013 1650 Full Code [id number]  Rigoberto Noel, MD Inpatient       Home/SNF/Other Home  Chief Complaint Lingular pneumonia [J18.9]  Level of Care/Admitting Diagnosis ED Disposition     ED Disposition  Admit   Condition  --   [facility]: [facility] [[id number]]  Level of Care: Telemetry [5]  Admit to tele based on following criteria: Monitor for Ischemic changes  May admit patient to [facility] or [facility] if equivalent level of care is available:: No  Covid Evaluation: Confirmed COVID Negative  Diagnosis: Lingular pneumonia [646803]  Admitting Physician: Vernelle Emerald [[id number]]  Attending Physician: Vernelle Emerald [[id number]]  Estimated length of stay: 3 - 4 days  Certification:: I certify this patient will need inpatient services for at least 2 midnights          Medical History Past Medical History:  Diagnosis Date   Anemia    history of anemia   Anxiety    Arthritis    Chronic low back pain    Chronic renal failure    patient denies    Diverticulosis    Dupuytren contracture    Right hand, has received injection   GERD (gastroesophageal reflux disease)    H/O tinnitus    left   Hearing loss    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 No Did it involve sudden or severe rash/hives, skin peeling, or any reaction on the inside of your mouth or nose? Yes Did you need to seek medical attention at a hospital or doctor's office? No When did it last happen?      10 + years If all above answers are "NO", may proceed with cephalosporin use.     Morphine And Related Swelling    Sedation/Swelling described like edema/bloating/doesn't help the pain Morphine only; tolerates other opioids.    Cefepime Rash    Reaction while taking both cefepime and vancomycin at [facility] (after 3  days)   Vancomycin Rash    Reaction while taking both cefepime and vancomycin at [facility] (after 3 days)    IV Location/Drains/Wounds Patient Lines/Drains/Airways Status     Active Line/Drains/Airways     Name Placement date Placement time Site Days   Peripheral IV 03/09/21 20 G Left Antecubital 03/09/21  1833  Antecubital  1            Labs/Imaging Results for orders placed or performed during the hospital encounter of 03/09/21 (from the past 48 hour(s))  Basic metabolic panel     Status: Abnormal   Collection Time: 03/09/21  6:38 PM  Result Value Ref Range   Sodium 129 (L) 135 - 145 mmol/L   Potassium 4.4 3.5 - 5.1 mmol/L   Chloride 92 (L) 98 - 111 mmol/L   CO2 31 22 - 32 mmol/L   Glucose, Bld 104 (H) 70 - 99 mg/dL    Comment: Glucose reference range applies only to samples taken after fasting for at least 8 hours.   BUN 12 8 - 23 mg/dL    Creatinine, Ser 0.68 0.44 - 1.00 mg/dL   Calcium 9.1 8.9 - 10.3 mg/dL   GFR, Estimated >60 >60 mL/min    Comment: (NOTE) Calculated using the CKD-EPI Creatinine Equation (2021)    Anion gap 6 5 - 15    Comment: Performed at [facility], [place] [address], [place], [place] [address]  Brain natriuretic peptide     Status: None   Collection Time: 03/09/21  6:38 PM  Result Value Ref Range   B Natriuretic Peptide 86.4 0.0 - 100.0 pg/mL    Comment: Performed at [facility], [place] [address] [place], [place] [address]  CBC with Differential     Status: Abnormal   Collection Time: 03/09/21  6:38 PM  Result Value Ref Range   WBC 3.2 (L) 4.0 - 10.5 K/uL   RBC 4.47 3.87 - 5.11 MIL/uL   Hemoglobin 13.6 12.0 - 15.0 g/dL   HCT 39.6 36.0 - 46.0 %   MCV 88.6 80.0 - 100.0 fL   MCH 30.4 26.0 - 34.0 pg   MCHC 34.3 30.0 - 36.0 g/dL   RDW 13.2 11.5 - 15.5 %   Platelets 175 150 - 400 K/uL   nRBC 0.0 0.0 - 0.2 %   Neutrophils Relative % 49 %   Neutro Abs 1.6 (L) 1.7 - 7.7 K/uL   Lymphocytes Relative 35 %   Lymphs Abs 1.1 0.7 - 4.0 K/uL   Monocytes Relative 12 %   Monocytes Absolute 0.4 0.1 - 1.0 K/uL   Eosinophils Relative 3 %   Eosinophils Absolute 0.1 0.0 - 0.5 K/uL   Basophils Relative 1 %   Basophils Absolute 0.0 0.0 - 0.1 K/uL   Immature Granulocytes 0 %   Abs Immature Granulocytes 0.01 0.00 - 0.07 K/uL    Comment: Performed at [facility], [place] [address] [place], [place] [address]  Troponin I (High Sensitivity)     Status: None   Collection Time: 03/09/21  6:38 PM  Result Value Ref Range   Troponin I (High Sensitivity) 4 <18 ng/L    Comment: (NOTE) Elevated high sensitivity troponin I (hsTnI) values and significant  changes across serial measurements may suggest ACS but many other  chronic and acute conditions are known to elevate hsTnI results.  Refer to the "Links" section for chest pain algorithms and additional   guidance. Performed at [facility], [place] [address], [place], [place] [address]   Resp Panel by RT-PCR (Flu A&B, Covid) Nasopharyngeal Swab     Status: None   Collection Time: 03/09/21  6:38 PM   Specimen: Nasopharyngeal Swab; Nasopharyngeal(NP) swabs in vial transport medium  Result Value Ref Range   SARS Coronavirus 2 by RT PCR NEGATIVE NEGATIVE    Comment: (NOTE) SARS-CoV-2 target nucleic acids are NOT DETECTED.  The SARS-CoV-2 RNA is generally detectable in upper respiratory specimens during the acute phase of infection. The lowest concentration of SARS-CoV-2 viral copies this assay can detect is 138 copies/mL. A negative result does not preclude SARS-Cov-2 infection and should not be used as the sole basis for treatment or other patient management decisions. A negative result may occur with  improper specimen collection/handling, submission of specimen other than nasopharyngeal swab, presence of viral mutation(s) within the areas targeted by this assay, and inadequate number of viral copies(<138 copies/mL). A negative result must be combined with clinical observations, patient history, and epidemiological information. The expected result is Negative.  Fact Sheet for Patients:  EntrepreneurPulse.com.au  Fact Sheet for Healthcare Providers:  IncredibleEmployment.be  This test is no t yet approved or cleared by the [place] FDA and  has been authorized for detection and/or diagnosis of SARS-CoV-2 by FDA under an Emergency Use Authorization (EUA). This EUA will remain  in effect (meaning this test can be used) for the duration of the COVID-19 declaration under Section 564(b)(1) of the Act, 21 U.S.C.section 360bbb-3(b)(1), unless the authorization is terminated  or revoked sooner.       Influenza A by PCR NEGATIVE NEGATIVE   Influenza B by PCR NEGATIVE NEGATIVE    Comment: (NOTE) The Xpert Xpress  SARS-CoV-2/FLU/RSV plus assay is intended as an aid in the diagnosis of influenza from Nasopharyngeal swab specimens and should not be used as a sole basis for treatment. Nasal washings and aspirates are unacceptable for Xpert Xpress SARS-CoV-2/FLU/RSV testing.  Fact Sheet for Patients: EntrepreneurPulse.com.au  Fact Sheet for Healthcare Providers: IncredibleEmployment.be  This test is not yet approved or cleared by the [place] FDA and has been authorized for detection and/or diagnosis of SARS-CoV-2 by FDA under an Emergency Use Authorization (EUA). This EUA will remain in effect (meaning this test can be used) for the duration of the COVID-19 declaration under Section 564(b)(1) of the Act, 21 U.S.C. section 360bbb-3(b)(1), unless the authorization is terminated or revoked.  Performed at [facility], [place] [address], [place], [place] [address]   Procalcitonin - Baseline     Status: None   Collection Time: 03/09/21  9:29 PM  Result Value Ref Range   Procalcitonin <0.10 ng/mL    Comment:        Interpretation: PCT (Procalcitonin) <= 0.5 ng/mL: Systemic infection (sepsis) is not likely. Local bacterial infection is possible. (NOTE)       Sepsis PCT Algorithm           Lower Respiratory Tract                                      Infection PCT Algorithm    ----------------------------     ----------------------------         PCT < 0.25 ng/mL                PCT < 0.10 ng/mL          Strongly  encourage             Strongly discourage   discontinuation of antibiotics    initiation of antibiotics    ----------------------------     -----------------------------       PCT 0.25 - 0.50 ng/mL            PCT 0.10 - 0.25 ng/mL               OR       >80% decrease in PCT            Discourage initiation of                                            antibiotics      Encourage discontinuation           of antibiotics     ----------------------------     -----------------------------         PCT >= 0.50 ng/mL              PCT 0.26 - 0.50 ng/mL               AND        <80% decrease in PCT             Encourage initiation of                                             antibiotics       Encourage continuation           of antibiotics    ----------------------------     -----------------------------        PCT >= 0.50 ng/mL                  PCT > 0.50 ng/mL               AND         increase in PCT                  Strongly encourage                                      initiation of antibiotics    Strongly encourage escalation           of antibiotics                                     -----------------------------                                           PCT <= 0.25 ng/mL                                                 OR                                        >  80% decrease in PCT                                      Discontinue / Do not initiate                                             antibiotics  Performed at [facility], [place] [address] [place], [place] [address]   C-reactive protein     Status: None   Collection Time: 03/09/21  9:29 PM  Result Value Ref Range   CRP 0.7 <1.0 mg/dL    Comment: Performed at [facility], [place] [address], [place], [place] [address]  D-dimer, quantitative     Status: None   Collection Time: 03/09/21  9:29 PM  Result Value Ref Range   D-Dimer, Quant <0.27 0.00 - 0.50 ug/mL-FEU    Comment: (NOTE) At the manufacturer cut-off value of 0.5 g/mL FEU, this assay has a negative predictive value of 95-100%.This assay is intended for use in conjunction with a clinical pretest probability (PTP) assessment model to exclude pulmonary embolism (PE) and deep venous thrombosis (DVT) in outpatients suspected of PE or DVT. Results should be correlated with clinical presentation. Performed at [facility], [place] [address] [place], [place] [address]   Blood gas, arterial     Status: Abnormal   Collection Time: 03/09/21  9:40 PM  Result Value Ref Range   FIO2 28.00    pH, Arterial 7.380 7.350 - 7.450   pCO2 arterial 51.7 (H) 32.0 - 48.0 mmHg   pO2, Arterial 68.7 (L) 83.0 - 108.0 mmHg   Bicarbonate 29.9 (H) 20.0 - 28.0 mmol/L   Acid-Base Excess 4.2 (H) 0.0 - 2.0 mmol/L   O2 Saturation 94.9 %   Patient temperature 98.6    Allens test (pass/fail) PASS PASS    Comment: Performed at [facility], [place] [address] [place], [place] [address]  Respiratory (~20 pathogens) panel by PCR     Status: Abnormal   Collection Time: 03/09/21  9:42 PM   Specimen: Respiratory  Result Value Ref Range   Adenovirus NOT DETECTED NOT DETECTED   Coronavirus 229E NOT DETECTED NOT DETECTED    Comment: (NOTE) The Coronavirus on the Respiratory Panel, DOES NOT test for the novel  Coronavirus (2019 nCoV)    Coronavirus HKU1 NOT DETECTED NOT DETECTED   Coronavirus NL63 NOT DETECTED NOT DETECTED   Coronavirus OC43 NOT DETECTED NOT DETECTED   Metapneumovirus NOT DETECTED NOT DETECTED   Rhinovirus / Enterovirus NOT DETECTED NOT DETECTED   Influenza A NOT DETECTED NOT DETECTED   Influenza B NOT DETECTED NOT DETECTED   Parainfluenza Virus 1 NOT DETECTED NOT DETECTED   Parainfluenza Virus 2 NOT DETECTED NOT DETECTED   Parainfluenza Virus 3 NOT DETECTED NOT DETECTED   Parainfluenza Virus 4 NOT DETECTED NOT DETECTED   Respiratory Syncytial Virus DETECTED (A) NOT DETECTED   Bordetella pertussis NOT DETECTED NOT DETECTED   Bordetella Parapertussis NOT DETECTED NOT DETECTED   Chlamydophila pneumoniae NOT DETECTED NOT DETECTED   Mycoplasma pneumoniae NOT DETECTED NOT DETECTED    Comment: Performed at [facility], [place] [address], [place], [place] [address]   DG Chest 2 View  Result Date: 03/09/2021 CLINICAL DATA:  Shortness of breath and cough for 4 days. EXAM: CHEST - 2 VIEW COMPARISON:  07/25/2020 FINDINGS:  Heart size is within normal limits. Mild pulmonary hyperinflation is again seen. New band like opacity is seen in the medial left lower lobe, which may be due to atelectasis or infiltrate. Right lung remains clear. No evidence of pleural effusion. IMPRESSION: New atelectasis versus infiltrate in medial left lower lobe. Electronically Signed   By: Marlaine Hind M.D.   On: 03/09/2021 16:25   CT CHEST W CONTRAST  Result Date: 03/10/2021 CLINICAL DATA:  Cough with incompletely treated pneumonia, initial encounter EXAM: CT CHEST WITH CONTRAST TECHNIQUE: Multidetector CT imaging of the chest was performed during intravenous contrast administration. CONTRAST:  44mL OMNIPAQUE IOHEXOL 350 MG/ML SOLN COMPARISON:  Chest x-ray from earlier in the same day, CT from 07/12/2018 FINDINGS: Cardiovascular: Thoracic aorta demonstrates atherosclerotic calcifications without aneurysmal dilatation. No dissection is identified. No cardiac enlargement is seen. No significant coronary calcifications are noted. Pulmonary artery was not timed for embolus evaluation although no central filling defect is seen. Mediastinum/Nodes: Thoracic inlet is within normal limits. No sizable hilar or mediastinal adenopathy is noted. Scattered small reactive lymph nodes are noted. Small sliding-type hiatal hernia is seen. Lungs/Pleura: Lungs are well aerated bilaterally. Some areas of scarring are noted in the right middle lobe and lateral aspect of the left lower lobe and lingula similar to that seen on a prior CT from 2020. new linear area of scarring is noted which corresponds to that seen on recent plain film. No acute infiltrate or sizable effusion is noted. No sizable parenchymal nodules are noted. Upper Abdomen: Visualized upper abdomen shows no acute abnormality. Musculoskeletal: Degenerative changes of the thoracic spine are noted. IMPRESSION: Areas of scarring in the bases bilaterally. One of which corresponds with the findings on recent plain  film examination. No acute infiltrate is seen. Aortic Atherosclerosis (ICD10-I70.0). Electronically Signed   By: Inez Catalina M.D.   On: 03/10/2021 00:58    Pending Labs Unresulted Labs (From admission, onward)     Start     Ordered   03/10/21 0500  CBC WITH DIFFERENTIAL  Tomorrow morning,   R        03/09/21 2141   03/10/21 0500  Comprehensive metabolic panel  Tomorrow morning,   R        03/09/21 2141   03/10/21 0500  Magnesium  Tomorrow morning,   R        03/09/21 2141   03/09/21 2142  Expectorated Sputum Assessment w Gram Stain, Rflx to Resp Cult  (COPD / Pneumonia / Cellulitis / Lower Extremity Wound)  Once,   R        03/09/21 2141   03/09/21 2128  Culture, blood (routine x 2)  BLOOD CULTURE X 2,   R (with TIMED occurrences)      03/09/21 2129            Vitals/Pain Today's Vitals   03/10/21 0000 03/10/21 0045 03/10/21 0100 03/10/21 0115  BP: (!) 153/67 (!) 125/113 (!) 153/81   Pulse: 71 78 73   Resp: 17 13 14 14   Temp:      TempSrc:      SpO2: 96% 92% 95%   Weight:      Height:      PainSc:        Isolation Precautions Droplet precaution  Medications Medications  enoxaparin (LOVENOX) injection 40 mg (40 mg Subcutaneous Given 03/10/21 0000)  acetaminophen (TYLENOL) tablet 650  mg (has no administration in time range)    Or  acetaminophen (TYLENOL) suppository 650 mg (has no administration in time range)  polyethylene glycol (MIRALAX / GLYCOLAX) packet 17 g (has no administration in time range)  ondansetron (ZOFRAN) tablet 4 mg (has no administration in time range)    Or  ondansetron (ZOFRAN) injection 4 mg (has no administration in time range)  melatonin tablet 10 mg (has no administration in time range)  0.9 %  sodium chloride infusion ( Intravenous New Bag/Given 03/09/21 2356)  atenolol (TENORMIN) tablet 100 mg (has no administration in time range)  cholecalciferol (VITAMIN D3) tablet 2,000 Units (2,000 Units Oral Given 03/09/21 2359)  vitamin B-12  (CYANOCOBALAMIN) tablet 1,000 mcg (has no administration in time range)  cycloSPORINE (RESTASIS) 0.05 % ophthalmic emulsion 1 drop (1 drop Both Eyes Given 03/10/21 0002)  diltiazem (CARDIZEM CD) 24 hr capsule 180 mg (has no administration in time range)  diazepam (VALIUM) tablet 5 mg (5 mg Oral Given 03/10/21 0110)  fluticasone (FLONASE) 50 MCG/ACT nasal spray 2 spray (has no administration in time range)  gabapentin (NEURONTIN) capsule 1,200 mg (1,200 mg Oral Given 03/09/21 2359)  mirabegron ER (MYRBETRIQ) tablet 50 mg (has no administration in time range)  morphine (MSIR) tablet 15 mg (has no administration in time range)  pantoprazole (PROTONIX) EC tablet 40 mg (has no administration in time range)  PARoxetine (PAXIL) tablet 40 mg (40 mg Oral Given 03/09/21 2359)  rosuvastatin (CRESTOR) tablet 5 mg (has no administration in time range)  SUMAtriptan (IMITREX) tablet 100 mg (has no administration in time range)  traZODone (DESYREL) tablet 150 mg (has no administration in time range)  albuterol (PROVENTIL) (2.5 MG/3ML) 0.083% nebulizer solution 2.5 mg (has no administration in time range)  hydrALAZINE (APRESOLINE) injection 10 mg (has no administration in time range)  predniSONE (DELTASONE) tablet 40 mg (has no administration in time range)  albuterol (PROVENTIL) (2.5 MG/3ML) 0.083% nebulizer solution 2.5 mg (has no administration in time range)  albuterol (PROVENTIL) (2.5 MG/3ML) 0.083% nebulizer solution 5 mg (5 mg Nebulization Given 03/09/21 1847)  methylPREDNISolone sodium succinate (SOLU-MEDROL) 125 mg/2 mL injection 125 mg (125 mg Intravenous Given 03/09/21 1847)  iohexol (OMNIPAQUE) 350 MG/ML injection 75 mL (75 mLs Intravenous Contrast Given 03/10/21 0018)    Mobility walks

## 2021-03-10 NOTE — ED Notes (Signed)
Patient is still with husband upstairs. Admitting doctor, Karleen Hampshire MD, notified.

## 2021-03-10 NOTE — Progress Notes (Signed)
PROGRESS NOTE    Bianca Haley  GYI:[id number] DOB: April 10, 1951 DOA: 03/09/2021 PCP: [name], MD    Chief Complaint  Patient presents with   Cough    Brief Narrative:   70 year old female with past medical history of hypertension, diastolic congestive heart failure (Echo 07/2018 EF greater than 65%),  anxiety disorder, migraine headaches peripheral neuropathy, gastroesophageal reflux disease, hyperlipidemia who presented with complaints of cough from a local urgent care clinic due to concerns for incompletely treated pneumonia. Ct chest with contrast showed Areas of scarring in the bases bilaterally. One of which corresponds with the findings on recent plain film examination. No acute infiltrate is seen Assessment & Plan:   Principal Problem:   Acute on chronic respiratory failure with hypoxia (HCC) Active Problems:   Essential hypertension   GERD without esophagitis   Pneumonia of left lower lobe due to infectious organism   Hyponatremia   Chronic diastolic CHF (congestive heart failure) (HCC)   Acute on chronic respiratory failure with hypoxia probably from viral infection.  - follow sputum cultures.  - [place] oxygen to keep sats greater than 90%.  - bronchodilators as needed.  - resp panel is positive for RSV.    Hyponatremia:  Probably from dehydration.     gERD.  Stable.    Hypertension:  Well controlled.     Chronic diastolic CHF.  She appears euvolemic.   Anxiety: Resume home meds.     DVT prophylaxis: (Lovenox/) Code Status: (Full code) Family Communication: none at bedside.  Disposition:   Status is: Inpatient  Remains inpatient appropriate because: hypoxic, requiring about 2lit of [place] oxygen.        Consultants:  None.   Procedures: none.   Antimicrobials:  Antibiotics Given (last 72 hours)     None         Subjective: Coughing,   Objective: Vitals:   03/10/21 0045 03/10/21 0100 03/10/21 0115 03/10/21 1144   BP: (!) 125/113 (!) 153/81  (!) 169/83  Pulse: 78 73  83  Resp: 13 14 14 16   Temp:    98 F (36.7 C)  TempSrc:    Oral  SpO2: 92% 95%  100%  Weight:      Height:       No intake or output data in the 24 hours ending 03/10/21 1259 Filed Weights   03/09/21 1834  Weight: 64.4 kg    Examination:  General exam: Appears calm and comfortable  Respiratory system: on 2 lit of [place] oxygen, tachypnea, and some scattered wheezing heard.  Cardiovascular system: S1 & S2 heard, RRR. No JVD,  No pedal edema. Gastrointestinal system: Abdomen is nondistended, soft and nontender.  Normal bowel sounds heard. Central nervous system: Alert and oriented. No focal neurological deficits. Extremities: Symmetric 5 x 5 power. Skin: No rashes, lesions or ulcers Psychiatry: Mood & affect appropriate.     Data Reviewed: I have personally reviewed following labs and imaging studies  CBC: Recent Labs  Lab 03/09/21 1838  WBC 3.2*  NEUTROABS 1.6*  HGB 13.6  HCT 39.6  MCV 88.6  PLT 350    Basic Metabolic Panel: Recent Labs  Lab 03/09/21 1838  NA 129*  K 4.4  CL 92*  CO2 31  GLUCOSE 104*  BUN 12  CREATININE 0.68  CALCIUM 9.1    GFR: Estimated Creatinine Clearance: 57.2 mL/min (by C-G formula based on SCr of 0.68 mg/dL).  Liver Function Tests: No results for input(s): AST, ALT, ALKPHOS, BILITOT, PROT,  ALBUMIN in the last 168 hours.  CBG: No results for input(s): GLUCAP in the last 168 hours.   Recent Results (from the past 240 hour(s))  Resp Panel by RT-PCR (Flu A&B, Covid) Nasopharyngeal Swab     Status: None   Collection Time: 03/09/21  6:38 PM   Specimen: Nasopharyngeal Swab; Nasopharyngeal(NP) swabs in vial transport medium  Result Value Ref Range Status   SARS Coronavirus 2 by RT PCR NEGATIVE NEGATIVE Final    Comment: (NOTE) SARS-CoV-2 target nucleic acids are NOT DETECTED.  The SARS-CoV-2 RNA is generally detectable in upper respiratory specimens during the acute phase  of infection. The lowest concentration of SARS-CoV-2 viral copies this assay can detect is 138 copies/mL. A negative result does not preclude SARS-Cov-2 infection and should not be used as the sole basis for treatment or other patient management decisions. A negative result may occur with  improper specimen collection/handling, submission of specimen other than nasopharyngeal swab, presence of viral mutation(s) within the areas targeted by this assay, and inadequate number of viral copies(<138 copies/mL). A negative result must be combined with clinical observations, patient history, and epidemiological information. The expected result is Negative.  Fact Sheet for Patients:  EntrepreneurPulse.com.au  Fact Sheet for Healthcare Providers:  IncredibleEmployment.be  This test is no t yet approved or cleared by the [place] FDA and  has been authorized for detection and/or diagnosis of SARS-CoV-2 by FDA under an Emergency Use Authorization (EUA). This EUA will remain  in effect (meaning this test can be used) for the duration of the COVID-19 declaration under Section 564(b)(1) of the Act, 21 U.S.C.section 360bbb-3(b)(1), unless the authorization is terminated  or revoked sooner.       Influenza A by PCR NEGATIVE NEGATIVE Final   Influenza B by PCR NEGATIVE NEGATIVE Final    Comment: (NOTE) The Xpert Xpress SARS-CoV-2/FLU/RSV plus assay is intended as an aid in the diagnosis of influenza from Nasopharyngeal swab specimens and should not be used as a sole basis for treatment. Nasal washings and aspirates are unacceptable for Xpert Xpress SARS-CoV-2/FLU/RSV testing.  Fact Sheet for Patients: EntrepreneurPulse.com.au  Fact Sheet for Healthcare Providers: IncredibleEmployment.be  This test is not yet approved or cleared by the [place] FDA and has been authorized for detection and/or diagnosis of  SARS-CoV-2 by FDA under an Emergency Use Authorization (EUA). This EUA will remain in effect (meaning this test can be used) for the duration of the COVID-19 declaration under Section 564(b)(1) of the Act, 21 U.S.C. section 360bbb-3(b)(1), unless the authorization is terminated or revoked.  Performed at [facility], [place] [address], [place], [place] [address]   Respiratory (~20 pathogens) panel by PCR     Status: Abnormal   Collection Time: 03/09/21  9:42 PM   Specimen: Respiratory  Result Value Ref Range Status   Adenovirus NOT DETECTED NOT DETECTED Final   Coronavirus 229E NOT DETECTED NOT DETECTED Final    Comment: (NOTE) The Coronavirus on the Respiratory Panel, DOES NOT test for the novel  Coronavirus (2019 nCoV)    Coronavirus HKU1 NOT DETECTED NOT DETECTED Final   Coronavirus NL63 NOT DETECTED NOT DETECTED Final   Coronavirus OC43 NOT DETECTED NOT DETECTED Final   Metapneumovirus NOT DETECTED NOT DETECTED Final   Rhinovirus / Enterovirus NOT DETECTED NOT DETECTED Final   Influenza A NOT DETECTED NOT DETECTED Final   Influenza B NOT DETECTED NOT DETECTED Final   Parainfluenza Virus 1 NOT DETECTED NOT DETECTED Final   Parainfluenza  Virus 2 NOT DETECTED NOT DETECTED Final   Parainfluenza Virus 3 NOT DETECTED NOT DETECTED Final   Parainfluenza Virus 4 NOT DETECTED NOT DETECTED Final   Respiratory Syncytial Virus DETECTED (A) NOT DETECTED Final   Bordetella pertussis NOT DETECTED NOT DETECTED Final   Bordetella Parapertussis NOT DETECTED NOT DETECTED Final   Chlamydophila pneumoniae NOT DETECTED NOT DETECTED Final   Mycoplasma pneumoniae NOT DETECTED NOT DETECTED Final    Comment: Performed at [facility], [place] [address] [place], [place] [address]         Radiology Studies: DG Chest 2 View  Result Date: 03/09/2021 CLINICAL DATA:  Shortness of breath and cough for 4 days. EXAM: CHEST - 2 VIEW COMPARISON:  07/25/2020 FINDINGS: Heart size is  within normal limits. Mild pulmonary hyperinflation is again seen. New band like opacity is seen in the medial left lower lobe, which may be due to atelectasis or infiltrate. Right lung remains clear. No evidence of pleural effusion. IMPRESSION: New atelectasis versus infiltrate in medial left lower lobe. Electronically Signed   By: Marlaine Hind M.D.   On: 03/09/2021 16:25   CT CHEST W CONTRAST  Result Date: 03/10/2021 CLINICAL DATA:  Cough with incompletely treated pneumonia, initial encounter EXAM: CT CHEST WITH CONTRAST TECHNIQUE: Multidetector CT imaging of the chest was performed during intravenous contrast administration. CONTRAST:  15mL OMNIPAQUE IOHEXOL 350 MG/ML SOLN COMPARISON:  Chest x-ray from earlier in the same day, CT from 07/12/2018 FINDINGS: Cardiovascular: Thoracic aorta demonstrates atherosclerotic calcifications without aneurysmal dilatation. No dissection is identified. No cardiac enlargement is seen. No significant coronary calcifications are noted. Pulmonary artery was not timed for embolus evaluation although no central filling defect is seen. Mediastinum/Nodes: Thoracic inlet is within normal limits. No sizable hilar or mediastinal adenopathy is noted. Scattered small reactive lymph nodes are noted. Small sliding-type hiatal hernia is seen. Lungs/Pleura: Lungs are well aerated bilaterally. Some areas of scarring are noted in the right middle lobe and lateral aspect of the left lower lobe and lingula similar to that seen on a prior CT from 2020. new linear area of scarring is noted which corresponds to that seen on recent plain film. No acute infiltrate or sizable effusion is noted. No sizable parenchymal nodules are noted. Upper Abdomen: Visualized upper abdomen shows no acute abnormality. Musculoskeletal: Degenerative changes of the thoracic spine are noted. IMPRESSION: Areas of scarring in the bases bilaterally. One of which corresponds with the findings on recent plain film  examination. No acute infiltrate is seen. Aortic Atherosclerosis (ICD10-I70.0). Electronically Signed   By: Inez Catalina M.D.   On: 03/10/2021 00:58        Scheduled Meds:  albuterol  2.5 mg Nebulization Q4H   atenolol  100 mg Oral q morning   cholecalciferol  2,000 Units Oral QHS   cycloSPORINE  1 drop Both Eyes BID   diltiazem  180 mg Oral q morning   enoxaparin (LOVENOX) injection  40 mg Subcutaneous Q24H   gabapentin  1,200 mg Oral QHS   mirabegron ER  50 mg Oral q morning   pantoprazole  40 mg Oral BID AC   PARoxetine  40 mg Oral BID   predniSONE  40 mg Oral Q breakfast   rosuvastatin  5 mg Oral q morning   cyanocobalamin  1,000 mcg Oral Daily   Continuous Infusions:   LOS: 1 day       Hosie Poisson, MD [facility]   To contact the attending provider between 7A-7P  or the covering provider during after hours 7P-7A, please log into the web site [email or web address] and access using universal [place] password for that web site. If you do not have the password, please call the hospital operator.  03/10/2021, 12:59 PM

## 2021-03-10 NOTE — ED Notes (Signed)
Patient back in room

## 2021-03-11 DIAGNOSIS — J9621 Acute and chronic respiratory failure with hypoxia: Secondary | ICD-10-CM | POA: Diagnosis not present

## 2021-03-11 DIAGNOSIS — K219 Gastro-esophageal reflux disease without esophagitis: Secondary | ICD-10-CM | POA: Diagnosis not present

## 2021-03-11 DIAGNOSIS — I5032 Chronic diastolic (congestive) heart failure: Secondary | ICD-10-CM | POA: Diagnosis not present

## 2021-03-11 DIAGNOSIS — I10 Essential (primary) hypertension: Secondary | ICD-10-CM | POA: Diagnosis not present

## 2021-03-11 LAB — OSMOLALITY, URINE: Osmolality, Ur: 549 mOsm/kg (ref 300–900)

## 2021-03-11 LAB — TSH: TSH: 0.213 u[IU]/mL — ABNORMAL LOW (ref 0.350–4.500)

## 2021-03-11 LAB — SODIUM, URINE, RANDOM: Sodium, Ur: <10 mmol/L

## 2021-03-11 LAB — OSMOLALITY: Osmolality: 287 mOsm/kg (ref 275–295)

## 2021-03-11 MED ORDER — POLYVINYL ALCOHOL 1.4 % OP SOLN: 1.0000 [drp] | OPHTHALMIC | Status: DC | PRN

## 2021-03-11 MED ORDER — POLYETHYL GLYCOL-PROPYL GLYCOL 0.4-0.3 % OP GEL: Freq: Two times a day (BID) | OPHTHALMIC | Status: DC

## 2021-03-11 MED ORDER — BENZONATATE 100 MG PO CAPS: 200.0000 mg | ORAL_CAPSULE | Freq: Three times a day (TID) | ORAL | Status: DC | PRN

## 2021-03-11 NOTE — Care Management Important Message (Signed)
Important Message  Patient Details IM Letter given to the Patient. Name: Bianca Haley MRN: [id number] Date of Birth: Apr 27, 1951   Medicare Important Message Given:  Yes     Kerin Salen 03/11/2021, 10:48 AM

## 2021-03-11 NOTE — TOC Initial Note (Signed)
Transition of Care [facility]) - Initial/Assessment Note    Patient Details  Name: Bianca Haley MRN: [id number] Date of Birth: Jan 07, 1951  [facility] of [facility]) CM/SW Contact:    Dessa Phi, RN Phone Number: 03/11/2021, 10:21 AM  Clinical Narrative: Patient from [facility]-d/c plan to return.                  Expected Discharge Plan: Home/Self Care Barriers to Discharge: Continued Medical Work up   Patient Goals and CMS Choice Patient states their goals for this hospitalization and ongoing recovery are:: return back to [place] CMS Medicare.gov Compare Post Acute Care list provided to:: Patient Choice offered to / list presented to : Patient  Expected Discharge Plan and Services Expected Discharge Plan: Home/Self Care   Discharge Planning Services: CM Consult   Living arrangements for the past 2 months: [place]                                      Prior Living Arrangements/Services Living arrangements for the past 2 months: [place] Lives with:: Self Patient language and need for interpreter reviewed:: Yes Do you feel safe going back to the place where you live?: Yes      Need for Family Participation in Patient Care: No (Comment) Care giver support system in place?: Yes (comment)   Criminal Activity/Legal Involvement Pertinent to Current Situation/Hospitalization: No - Comment as needed  Activities of Daily Living Home Assistive Devices/Equipment: Eyeglasses, Built-in shower seat, Grab bars in shower, Hand-held shower hose, Grab bars around toilet, Walker (specify type), Other (Comment), Oxygen (walk-in shower, handicap height toilet, front wheeled walker) ADL Screening (condition at time of admission) Patient's cognitive ability adequate to safely complete daily activities?: Yes Is the patient deaf or have difficulty hearing?: No Does the patient have difficulty seeing, even when wearing  glasses/contacts?: No Does the patient have difficulty concentrating, remembering, or making decisions?: No Patient able to express need for assistance with ADLs?: Yes Does the patient have difficulty dressing or bathing?: No Independently performs ADLs?: Yes (appropriate for developmental age) Does the patient have difficulty walking or climbing stairs?: Yes (secondary to coughing x 4 days) Weakness of Legs: Both Weakness of Arms/Hands: None  Permission Sought/Granted Permission sought to share information with : Case Manager Permission granted to share information with : Yes, Verbal Permission Granted  Share Information with NAME: Case Manager     Permission granted to share info w Relationship: Lysbeth Galas dtr [phone]     Emotional Assessment Appearance:: Appears stated age Attitude/Demeanor/Rapport: Gracious Affect (typically observed): Accepting Orientation: : Oriented to Self, Oriented to Place, Oriented to  Time, Oriented to Situation Alcohol / Substance Use: Not Applicable Psych Involvement: No (comment)  Admission diagnosis:  Hypoxia [R09.02] Lingular pneumonia [J18.9] Acute cough [R05.1] Patient Active Problem List   Diagnosis Date Noted   Pneumonia of left lower lobe due to infectious organism 03/09/2021   Hyponatremia 03/09/2021   Chronic diastolic CHF (congestive heart failure) ([place]) 03/09/2021   Acute on chronic respiratory failure with hypoxia ([place]) 03/09/2021   Anxiety disorder 10/02/2020   Atherosclerosis of aorta ([place]) 10/02/2020   Actinic keratosis 07/31/2020   ARF (acute renal failure) ([place]) 07/26/2020   Encephalopathy acute 07/26/2020   Acute encephalopathy 07/25/2020   DOE (dyspnea on exertion) 01/30/2019   Sleep disorder 01/30/2019   Dilated bile duct  Pancreatic duct dilated    History of lumbar fusion 09/20/2018   Dilation of biliary tract    Hyperglycemia 06/23/2018   Memory loss 03/12/2017   Left foot infection 02/08/2017   Deep  incisional surgical site infection 11/09/2016   Allergic reaction caused by a drug 11/06/2016   Fever 09/16/2016   Body aches 09/16/2016   Cough 09/16/2016   Edema 09/15/2016   Cerumen impaction 06/11/2016   Posterior tibial tendon dysfunction (PTTD) of left lower extremity 04/13/2016   Dyslipidemia 09/25/2015   S/P left TKA 07/15/2015   S/P knee replacement 07/15/2015   Esophageal dysphagia 09/18/2014   GERD without esophagitis 09/04/2014   Chronic narcotic dependence ([employer]) 09/04/2014   Gross hematuria 08/31/2014   Hemorrhagic cystitis 08/24/2014   Bilateral lower extremity edema 04/23/2014   Cervical radiculopathy 04/02/2014   Osteoarthritis of left knee 10/25/2013   Knee pain, left 10/13/2013   Left leg swelling 10/13/2013   Lumbar radiculopathy 10/11/2013   Chronic neck and back pain 09/28/2013   Hypokalemia 09/28/2013   Abnormal CT scan, head 09/27/2013   Nocturnal hypoxia 57/05/7791   Diastolic dysfunction- grade I 02/24/2013   Recurrent pneumonia 02/21/2013   Chronic cough 01/04/2013   Dupuytren's contracture 03/25/2012   Contracture of palmar fascia (Dupuytren's) 08/04/2011   VENOUS INSUFFICIENCY, LEFT LEG 05/14/2010   CRI (chronic renal insufficiency), stage 3 (moderate) ([place]) 12/02/2009   INGUINAL PAIN, RIGHT 12/02/2009   UNSPECIFIED ANEMIA 11/28/2009   PULMONARY NODULE 05/10/2008   FIBROIDS, UTERUS 08/03/2007   MIGRAINE HEADACHE 08/03/2007   TINNITUS, LEFT 08/03/2007   Essential hypertension 08/03/2007   ENDOMETRIOSIS 08/03/2007   ALCOHOL ABUSE, HX OF 08/03/2007   MEASLES, HX OF 08/03/2007   Personal history of urinary disorder 08/03/2007   PCP:  [name], MD Pharmacy:   [facility] [place], [place] AT [address] [place] [address] Phone: [phone] Fax: [phone]     Social Determinants of Health (SDOH) Interventions    Readmission Risk  Interventions No flowsheet data found.

## 2021-03-11 NOTE — Plan of Care (Signed)
  Problem: Education: Goal: Knowledge of General Education information will improve Description: Including pain rating scale, medication(s)/side effects and non-pharmacologic comfort measures Outcome: Completed/Met   Problem: Health Behavior/Discharge Planning: Goal: Ability to manage health-related needs will improve Outcome: Progressing   Problem: Clinical Measurements: Goal: Ability to maintain clinical measurements within normal limits will improve Outcome: Progressing Goal: Will remain free from infection Outcome: Progressing Goal: Diagnostic test results will improve Outcome: Progressing Goal: Respiratory complications will improve Outcome: Progressing Goal: Cardiovascular complication will be avoided Outcome: Completed/Met   Problem: Activity: Goal: Risk for activity intolerance will decrease Outcome: Completed/Met   Problem: Nutrition: Goal: Adequate nutrition will be maintained Outcome: Completed/Met   Problem: Coping: Goal: Level of anxiety will decrease Outcome: Adequate for Discharge   Problem: Elimination: Goal: Will not experience complications related to bowel motility Outcome: Adequate for Discharge Goal: Will not experience complications related to urinary retention Outcome: Completed/Met   Problem: Pain Managment: Goal: General experience of comfort will improve Outcome: Adequate for Discharge   Problem: Safety: Goal: Ability to remain free from injury will improve Outcome: Progressing   Problem: Skin Integrity: Goal: Risk for impaired skin integrity will decrease Outcome: Completed/Met

## 2021-03-11 NOTE — Progress Notes (Signed)
PROGRESS NOTE    Bianca Haley  RUE:[id number] DOB: [birth year] DOA: 03/09/2021 PCP: [name], MD    Chief Complaint  Patient presents with   Cough    Brief Narrative:   70 year old female with past medical history of hypertension, diastolic congestive heart failure (Echo 07/2018 EF greater than 65%),  anxiety disorder, migraine headaches peripheral neuropathy, gastroesophageal reflux disease, hyperlipidemia who presented with complaints of cough from a local urgent care clinic due to concerns for incompletely treated pneumonia. Ct chest with contrast showed Areas of scarring in the bases bilaterally. One of which corresponds with the findings on recent plain film examination. No acute infiltrate is seen Assessment & Plan:   Principal Problem:   Acute on chronic respiratory failure with hypoxia (HCC) Active Problems:   Essential hypertension   GERD without esophagitis   Pneumonia of left lower lobe due to infectious organism   Hyponatremia   Chronic diastolic CHF (congestive heart failure) (HCC)   Acute on chronic respiratory failure with hypoxia probably from viral infection.  - follow sputum cultures.  - [place] oxygen to keep sats greater than 90%. She is weaned off oxygen. She is still on [place] oxygen at night.  - resp panel is positive for RSV.  - continue with steroids and bronchodilators.    Hyponatremia:  Probably from dehydration. Improved to 130 today.  Check for SIADH.  Get TSH, urine osmo, serum osmo, am cortisol and urine sodium levels.     GERD Stable.    Hypertension:  Well controlled.     Chronic diastolic CHF.  She appears euvolemic.   Anxiety: Resume home meds.   PT 's husband is actively dying and is at [facility], she requested to be discharged tomorrow.   DVT prophylaxis: (Lovenox/) Code Status: (Full code) Family Communication: none at bedside.  Disposition:   Status is: Inpatient  Remains inpatient appropriate because: hypoxic,  requiring about 2lit of  oxygen.        Consultants:  None.   Procedures: none.   Antimicrobials:  Antibiotics Given (last 72 hours)     None         Subjective: Still coughing with some scattered wheezing.   Objective: Vitals:   03/10/21 2039 03/11/21 0026 03/11/21 0515 03/11/21 1300  BP: 127/77 133/62 (!) 154/62 (!) 144/78  Pulse: 71 61 67 64  Resp: 16 16 18 18   Temp: 98.4 F (36.9 C) 97.8 F (36.6 C) 98 F (36.7 C) 98.5 F (36.9 C)  TempSrc: Oral Oral Oral Oral  SpO2: 94% 92% 93% 92%  Weight:      Height:        Intake/Output Summary (Last 24 hours) at 03/11/2021 1415 Last data filed at 03/11/2021 1000 Gross per 24 hour  Intake 240 ml  Output --  Net 240 ml   Filed Weights   03/09/21 1834  Weight: 64.4 kg    Examination:  General exam: Appears calm and comfortable  Respiratory system: scattered wheezing posteriorly, on RA.  Cardiovascular system: S1 & S2 heard, RRR. No JVD,  No pedal edema. Gastrointestinal system: Abdomen is nondistended, soft and nontender. . Normal bowel sounds heard. Central nervous system: Alert and oriented. No focal neurological deficits. Extremities: Symmetric 5 x 5 power. Skin: No rashes, lesions or ulcers Psychiatry:  Mood & affect appropriate.      Data Reviewed: I have personally reviewed following labs and imaging studies  CBC: Recent Labs  Lab 03/09/21 1838 03/10/21 1750  WBC 3.2* 8.0  NEUTROABS 1.6* 6.9  HGB 13.6 14.5  HCT 39.6 42.7  MCV 88.6 88.4  PLT 175 233     Basic Metabolic Panel: Recent Labs  Lab 03/09/21 1838 03/10/21 1750  NA 129* 130*  K 4.4 4.7  CL 92* 91*  CO2 31 29  GLUCOSE 104* 177*  BUN 12 20  CREATININE 0.68 0.86  CALCIUM 9.1 10.0  MG  --  2.0     GFR: Estimated Creatinine Clearance: 53.2 mL/min (by C-G formula based on SCr of 0.86 mg/dL).  Liver Function Tests: Recent Labs  Lab 03/10/21 1750  AST 42*  ALT 28  ALKPHOS 79  BILITOT 0.5  PROT 8.3*  ALBUMIN  4.6    CBG: No results for input(s): GLUCAP in the last 168 hours.   Recent Results (from the past 240 hour(s))  Resp Panel by RT-PCR (Flu A&B, Covid) Nasopharyngeal Swab     Status: None   Collection Time: 03/09/21  6:38 PM   Specimen: Nasopharyngeal Swab; Nasopharyngeal(NP) swabs in vial transport medium  Result Value Ref Range Status   SARS Coronavirus 2 by RT PCR NEGATIVE NEGATIVE Final    Comment: (NOTE) SARS-CoV-2 target nucleic acids are NOT DETECTED.  The SARS-CoV-2 RNA is generally detectable in upper respiratory specimens during the acute phase of infection. The lowest concentration of SARS-CoV-2 viral copies this assay can detect is 138 copies/mL. A negative result does not preclude SARS-Cov-2 infection and should not be used as the sole basis for treatment or other patient management decisions. A negative result may occur with  improper specimen collection/handling, submission of specimen other than nasopharyngeal swab, presence of viral mutation(s) within the areas targeted by this assay, and inadequate number of viral copies(<138 copies/mL). A negative result must be combined with clinical observations, patient history, and epidemiological information. The expected result is Negative.  Fact Sheet for Patients:  EntrepreneurPulse.com.au  Fact Sheet for Healthcare Providers:  IncredibleEmployment.be  This test is no t yet approved or cleared by the [place] FDA and  has been authorized for detection and/or diagnosis of SARS-CoV-2 by FDA under an Emergency Use Authorization (EUA). This EUA will remain  in effect (meaning this test can be used) for the duration of the COVID-19 declaration under Section 564(b)(1) of the Act, 21 U.S.C.section 360bbb-3(b)(1), unless the authorization is terminated  or revoked sooner.       Influenza A by PCR NEGATIVE NEGATIVE Final   Influenza B by PCR NEGATIVE NEGATIVE Final    Comment:  (NOTE) The Xpert Xpress SARS-CoV-2/FLU/RSV plus assay is intended as an aid in the diagnosis of influenza from Nasopharyngeal swab specimens and should not be used as a sole basis for treatment. Nasal washings and aspirates are unacceptable for Xpert Xpress SARS-CoV-2/FLU/RSV testing.  Fact Sheet for Patients: EntrepreneurPulse.com.au  Fact Sheet for Healthcare Providers: IncredibleEmployment.be  This test is not yet approved or cleared by the [place] FDA and has been authorized for detection and/or diagnosis of SARS-CoV-2 by FDA under an Emergency Use Authorization (EUA). This EUA will remain in effect (meaning this test can be used) for the duration of the COVID-19 declaration under Section 564(b)(1) of the Act, 21 U.S.C. section 360bbb-3(b)(1), unless the authorization is terminated or revoked.  Performed at [facility], [place] [address] [place],  [address]   Culture, blood (routine x 2)     Status: None (Preliminary result)   Collection Time: 03/09/21  9:28 PM   Specimen: BLOOD  Result Value Ref Range  Status   Specimen Description   Final    BLOOD LEFT ANTECUBITAL Performed at [facility] [address] [place], [place] [address]    Special Requests   Final    BOTTLES DRAWN AEROBIC AND ANAEROBIC Blood Culture adequate volume Performed at [facility] [address] [place], [place] [address]    Culture   Final    NO GROWTH 1 DAY Performed at [facility], [place] [address], [place], [place] [address]    Report Status PENDING  Incomplete  Respiratory (~20 pathogens) panel by PCR     Status: Abnormal   Collection Time: 03/09/21  9:42 PM   Specimen: Respiratory  Result Value Ref Range Status   Adenovirus NOT DETECTED NOT DETECTED Final   Coronavirus 229E NOT DETECTED NOT DETECTED Final    Comment: (NOTE) The Coronavirus on the Respiratory Panel, DOES NOT  test for the novel  Coronavirus (2019 nCoV)    Coronavirus HKU1 NOT DETECTED NOT DETECTED Final   Coronavirus NL63 NOT DETECTED NOT DETECTED Final   Coronavirus OC43 NOT DETECTED NOT DETECTED Final   Metapneumovirus NOT DETECTED NOT DETECTED Final   Rhinovirus / Enterovirus NOT DETECTED NOT DETECTED Final   Influenza A NOT DETECTED NOT DETECTED Final   Influenza B NOT DETECTED NOT DETECTED Final   Parainfluenza Virus 1 NOT DETECTED NOT DETECTED Final   Parainfluenza Virus 2 NOT DETECTED NOT DETECTED Final   Parainfluenza Virus 3 NOT DETECTED NOT DETECTED Final   Parainfluenza Virus 4 NOT DETECTED NOT DETECTED Final   Respiratory Syncytial Virus DETECTED (A) NOT DETECTED Final   Bordetella pertussis NOT DETECTED NOT DETECTED Final   Bordetella Parapertussis NOT DETECTED NOT DETECTED Final   Chlamydophila pneumoniae NOT DETECTED NOT DETECTED Final   Mycoplasma pneumoniae NOT DETECTED NOT DETECTED Final    Comment: Performed at [facility], [place]. [address], [place], [place] [address]          Radiology Studies: DG Chest 2 View  Result Date: 03/09/2021 CLINICAL DATA:  Shortness of breath and cough for 4 days. EXAM: CHEST - 2 VIEW COMPARISON:  07/25/2020 FINDINGS: Heart size is within normal limits. Mild pulmonary hyperinflation is again seen. New band like opacity is seen in the medial left lower lobe, which may be due to atelectasis or infiltrate. Right lung remains clear. No evidence of pleural effusion. IMPRESSION: New atelectasis versus infiltrate in medial left lower lobe. Electronically Signed   By: Marlaine Hind M.D.   On: 03/09/2021 16:25   CT CHEST W CONTRAST  Result Date: 03/10/2021 CLINICAL DATA:  Cough with incompletely treated pneumonia, initial encounter EXAM: CT CHEST WITH CONTRAST TECHNIQUE: Multidetector CT imaging of the chest was performed during intravenous contrast administration. CONTRAST:  31mL OMNIPAQUE IOHEXOL 350 MG/ML SOLN COMPARISON:  Chest x-ray from  earlier in the same day, CT from 07/12/2018 FINDINGS: Cardiovascular: Thoracic aorta demonstrates atherosclerotic calcifications without aneurysmal dilatation. No dissection is identified. No cardiac enlargement is seen. No significant coronary calcifications are noted. Pulmonary artery was not timed for embolus evaluation although no central filling defect is seen. Mediastinum/Nodes: Thoracic inlet is within normal limits. No sizable hilar or mediastinal adenopathy is noted. Scattered small reactive lymph nodes are noted. Small sliding-type hiatal hernia is seen. Lungs/Pleura: Lungs are well aerated bilaterally. Some areas of scarring are noted in the right middle lobe and lateral aspect of the left lower lobe and lingula similar to that seen on a prior CT from  2020. new linear area of scarring is noted which corresponds to that seen on recent plain film. No acute infiltrate or sizable effusion is noted. No sizable parenchymal nodules are noted. Upper Abdomen: Visualized upper abdomen shows no acute abnormality. Musculoskeletal: Degenerative changes of the thoracic spine are noted. IMPRESSION: Areas of scarring in the bases bilaterally. One of which corresponds with the findings on recent plain film examination. No acute infiltrate is seen. Aortic Atherosclerosis (ICD10-I70.0). Electronically Signed   By: Inez Catalina M.D.   On: 03/10/2021 00:58        Scheduled Meds:  atenolol  100 mg Oral q morning   cholecalciferol  2,000 Units Oral QHS   cycloSPORINE  1 drop Both Eyes BID   diltiazem  180 mg Oral q morning   enoxaparin (LOVENOX) injection  40 mg Subcutaneous Q24H   gabapentin  1,200 mg Oral QHS   mirabegron ER  50 mg Oral q morning   pantoprazole  40 mg Oral BID AC   PARoxetine  40 mg Oral BID   predniSONE  40 mg Oral Q breakfast   rosuvastatin  5 mg Oral q morning   cyanocobalamin  1,000 mcg Oral Daily   Continuous Infusions:   LOS: 2 days       Hosie Poisson, MD [facility]   To contact the attending provider between 7A-7P or the covering provider during after hours 7P-7A, please log into the web site [email or web address] and access using universal [place] password for that web site. If you do not have the password, please call the hospital operator.  03/11/2021, 2:15 PM

## 2021-03-11 NOTE — Progress Notes (Signed)
Pt c/o dry eye which she states is chronic for her. She is requesting MD order her Systane and Retaine eye drops as she takes them at home. Message sent to Dr. Karleen Hampshire asking for order for each of these.

## 2021-03-11 NOTE — Progress Notes (Signed)
PT Cancellation Note  Patient Details Name: Bianca Haley MRN: [id number] DOB: 1950-12-08   Cancelled Treatment:    Reason Eval/Treat Not Completed: PT screened, no needs identified, will sign off  PT orders received and chart reviewed.  Found pt dressed and ambulating in hall.  She was able to carry on conversation and had no shortness of breath.  No apparent deficits - walking well.  No PT needs, pt agrees.    Abran Richard, PT Acute Rehab Services Pager [phone] [facility] 03/11/2021, 10:19 AM

## 2021-03-12 LAB — BASIC METABOLIC PANEL
Anion gap: 6 (ref 5–15)
BUN: 25 mg/dL — ABNORMAL HIGH (ref 8–23)
CO2: 30 mmol/L (ref 22–32)
Calcium: 8.8 mg/dL — ABNORMAL LOW (ref 8.9–10.3)
Chloride: 97 mmol/L — ABNORMAL LOW (ref 98–111)
Creatinine, Ser: 0.8 mg/dL (ref 0.44–1.00)
GFR, Estimated: >60 mL/min (ref >60)
Glucose, Bld: 154 mg/dL — ABNORMAL HIGH (ref 70–99)
Potassium: 3.9 mmol/L (ref 3.5–5.1)
Sodium: 133 mmol/L — ABNORMAL LOW (ref 135–145)

## 2021-03-12 LAB — CORTISOL-AM, BLOOD: Cortisol - AM: 4 ug/dL — ABNORMAL LOW (ref 6.7–22.6)

## 2021-03-12 MED ORDER — HYDROCODONE BIT-HOMATROP MBR 5-1.5 MG/5ML PO SOLN
5.0000 mL | Freq: Two times a day (BID) | ORAL | 0 refills | Status: DC | PRN
Start: 2021-03-12 — End: 2021-03-17

## 2021-03-12 MED ORDER — ALBUTEROL SULFATE HFA 108 (90 BASE) MCG/ACT IN AERS
2.0000 | INHALATION_SPRAY | Freq: Four times a day (QID) | RESPIRATORY_TRACT | Status: DC
  Administered 2021-03-12: 2 via RESPIRATORY_TRACT
  Filled 2021-03-12: qty 6.7

## 2021-03-12 MED ORDER — PREDNISONE 20 MG PO TABS: 40.0000 mg | ORAL_TABLET | Freq: Every day | ORAL | 0 refills | Status: DC

## 2021-03-12 MED ORDER — GUAIFENESIN ER 600 MG PO TB12: 600.0000 mg | ORAL_TABLET | Freq: Two times a day (BID) | ORAL | 2 refills | Status: DC

## 2021-03-12 MED ORDER — GABAPENTIN 300 MG PO CAPS: 1200.0000 mg | ORAL_CAPSULE | Freq: Every day | ORAL | 0 refills | Status: DC

## 2021-03-12 NOTE — Consult Note (Signed)
[facility] CM Inpatient Consult   03/12/2021  Bianca Haley June 11, 1950 [id number]  [facility] CM)   Primary Care Provider Office: [place]    Patient was screened for [facility] services due to high risk score for unplanned readmission. Per review, patient has been active with chronic care management and care coordination team at primary provider office.  Plan: Continue to follow for progression and disposition.  Of note, [facility] CM services does not replace or interfere with any services that are arranged by inpatient case management or social work.   Netta Cedars, MSN, RN [facility] [employer] [phone]  Toll free office [phone]

## 2021-03-13 ENCOUNTER — Telehealth: Payer: Self-pay

## 2021-03-13 NOTE — Discharge Summary (Addendum)
Physician Discharge Summary  Bianca Haley WJX:[id number] DOB: 07/21/1950 DOA: 03/09/2021  PCP: [name], MD  Admit date: 03/09/2021 Discharge date: 03/12/2021  Admitted From: Home Disposition: Home  Recommendations for Outpatient Follow-up:  Follow up with PCP in 1-2 weeks Please obtain BMP/CBC in one week Repeat cortisol level in the next 2 weeks on follow-up.  If remains low, can consider ACTH stimulation test.  She does not have any clinical evidence of adrenal insufficiency at this point.  Home Health: Equipment/Devices:  Discharge Condition: Stable CODE STATUS: Full code Diet recommendation: Heart healthy  Brief/Interim Summary: 70 year old female with past medical history of hypertension, diastolic congestive heart failure (Echo 07/2018 EF greater than 65%),  anxiety disorder, migraine headaches peripheral neuropathy, gastroesophageal reflux disease, hyperlipidemia who presented with complaints of cough from a local urgent care clinic due to concerns for incompletely treated pneumonia. Ct chest with contrast showed Areas of scarring in the bases bilaterally. One of which corresponds with the findings on recent plain film examination. No acute infiltrate is seen  Discharge Diagnoses:  Principal Problem:   Acute on chronic respiratory failure with hypoxia (HCC) Active Problems:   Essential hypertension   GERD without esophagitis    Acute bronchitis secondary to RSV   Hyponatremia   Chronic diastolic CHF (congestive heart failure) (HCC)  Acute on chronic respiratory failure with hypoxia related to RSV viral infection -Patient was weaned off oxygen and was breathing comfortably on room air - resp panel is positive for RSV.  -She was treated supportively with bronchodilators and steroids -She has been transitioned to prednisone taper     Hyponatremia:  Probably from dehydration. Improved to 133 on discharge Serum osmolality normal at 287 Urine sodium less  than 10, urine osmolality 549 Suspect this was related to decreased solute intake and dehydration -Overall labs have improved       GERD Stable.      Hypertension:  Well controlled.        Chronic diastolic CHF.  She appears euvolemic.    Anxiety: Resume home meds.   Discharge Instructions  Discharge Instructions     Diet - low sodium heart healthy   Complete by: As directed    Increase activity slowly   Complete by: As directed       Allergies as of 03/12/2021       Reactions   Penicillins Shortness Of Breath, Rash   Did it involve swelling of the face/tongue/throat, SOB, or low BP? No Did it involve sudden or severe rash/hives, skin peeling, or any reaction on the inside of your mouth or nose? Yes Did you need to seek medical attention at a hospital or doctor's office? No When did it last happen?      10 + years If all above answers are "NO", may proceed with cephalosporin use.   Morphine And Related Swelling   Sedation/Swelling described like edema/bloating/doesn't help the pain Morphine only; tolerates other opioids.    Cefepime Rash   Reaction while taking both cefepime and vancomycin at [facility] (after 3 days)   Vancomycin Rash   Reaction while taking both cefepime and vancomycin at [facility] (after 3 days)        Medication List     STOP taking these medications    Kerendia 10 MG Tabs Generic drug: Finerenone   levofloxacin 500 MG tablet Commonly known as: LEVAQUIN       TAKE these medications    acetaminophen 650 MG CR tablet Commonly known as:  TYLENOL Take 1,300 mg by mouth every 8 (eight) hours as needed for pain.   albuterol 108 (90 Base) MCG/ACT inhaler Commonly known as: Ventolin HFA INHALE 2 PUFFS INTO THE LUNGS EVERY 4 HOURS AS NEEDED FOR WHEEZING OR SHORTNESS OF BREATH What changed:  how much to take how to take this when to take this reasons to take this additional instructions   atenolol 100 MG tablet Commonly known as:  TENORMIN TAKE 1 TABLET(100 MG) BY MOUTH EVERY DAY What changed: See the new instructions.   Calcium Plus Vitamin D3 600-12.5 MG-MCG Caps Generic drug: Calcium Carb-Cholecalciferol Take 2 tablets by mouth daily.   cyanocobalamin 1000 MCG tablet Take 1,000 mcg by mouth daily. Vitamin b12   cycloSPORINE 0.05 % ophthalmic emulsion Commonly known as: RESTASIS Place 1 drop into both eyes 2 (two) times daily.   diazepam 5 MG tablet Commonly known as: VALIUM Take 1 tablet (5 mg total) by mouth daily as needed for anxiety or muscle spasms. What changed: when to take this   diltiazem 180 MG 24 hr capsule Commonly known as: CARDIZEM CD TAKE 1 CAPSULE(180 MG) BY MOUTH DAILY What changed:  how much to take how to take this when to take this additional instructions   fluticasone 50 MCG/ACT nasal spray Commonly known as: FLONASE Place 2 sprays into both nostrils daily as needed for allergies or rhinitis.   gabapentin 300 MG capsule Commonly known as: NEURONTIN Take 4 capsules (1,200 mg total) by mouth at bedtime.   guaiFENesin 600 MG 12 hr tablet Commonly known as: Mucinex Take 1 tablet (600 mg total) by mouth 2 (two) times daily.   HYDROcodone bit-homatropine 5-1.5 MG/5ML syrup Commonly known as: HYCODAN Take 5 mLs by mouth every 12 (twelve) hours as needed for cough.   Melatonin 10 MG Subl Place 10 mg under the tongue at bedtime.   mirabegron ER 50 MG Tb24 tablet Commonly known as: MYRBETRIQ Take 50 mg by mouth every morning.   multivitamin with minerals Tabs tablet Take 1 tablet by mouth 2 (two) times daily.   OVER THE COUNTER MEDICATION Take 2 capsules by mouth in the morning. [place]   OXYGEN Inhale 1 L into the lungs See admin instructions. Overnight   oxymorphone 10 MG tablet Commonly known as: OPANA Take 1 tablet (10 mg total) by mouth daily as needed for pain. What changed:  how much to take when to take this additional instructions   pantoprazole 40  MG tablet Commonly known as: PROTONIX TAKE 1 TABLET(40 MG) BY MOUTH TWICE DAILY BEFORE A MEAL What changed: See the new instructions.   PARoxetine 40 MG tablet Commonly known as: PAXIL TAKE 1 TABLET(40 MG) BY MOUTH TWICE DAILY What changed:  how much to take how to take this when to take this additional instructions   polyethylene glycol 17 g packet Commonly known as: MIRALAX / GLYCOLAX Take 17 g by mouth 2 (two) times daily. What changed:  when to take this reasons to take this additional instructions   potassium chloride 10 MEQ tablet Commonly known as: KLOR-CON TAKE 1 TABLET(10 MEQ) BY MOUTH DAILY What changed: See the new instructions.   predniSONE 20 MG tablet Commonly known as: DELTASONE Take 2 tablets (40 mg total) by mouth daily with breakfast.   Probiotic Caps Take 1 capsule by mouth every morning.   rosuvastatin 5 MG tablet Commonly known as: CRESTOR TAKE 1 TABLET(5 MG) BY MOUTH DAILY What changed: See the new instructions.   SUMAtriptan 100  MG tablet Commonly known as: IMITREX TAKE 1 TABLET BY MOUTH DAILY AS NEEDED FOR MIGRAINE. MAY REPEAT IN 2 HOURS IF HEADACHE PERSISTS OR RECURS What changed: See the new instructions.   SYSTANE OP Place 1 drop into both eyes 2 (two) times daily.   traZODone 150 MG tablet Commonly known as: DESYREL TAKE 1 TABLET BY MOUTH AT BEDTIME AS NEEDED FOR SLEEP What changed:  when to take this additional instructions   valACYclovir 500 MG tablet Commonly known as: VALTREX Take 500 mg by mouth See admin instructions. Take one tablet (500 mg) by mouth twice daily for 3-5 days as needed for outbreaks   VITAMIN C PO Take 1 tablet by mouth every morning.   Vitamin D 50 MCG (2000 UT) tablet Take 2,000 Units by mouth at bedtime.        Follow-up Information     Plotnikov, [name], MD Follow up.   Specialty: Internal Medicine Why: follow up in 2 weeks. Have repeat cortisol level checked on follow up Contact  information: [address] [place] [address] [phone]                Allergies  Allergen Reactions   Penicillins Shortness Of Breath and Rash    Did it involve swelling of the face/tongue/throat, SOB, or low BP? No Did it involve sudden or severe rash/hives, skin peeling, or any reaction on the inside of your mouth or nose? Yes Did you need to seek medical attention at a hospital or doctor's office? No When did it last happen?      10 + years If all above answers are "NO", may proceed with cephalosporin use.     Morphine And Related Swelling    Sedation/Swelling described like edema/bloating/doesn't help the pain Morphine only; tolerates other opioids.    Cefepime Rash    Reaction while taking both cefepime and vancomycin at [facility] (after 3 days)   Vancomycin Rash    Reaction while taking both cefepime and vancomycin at [facility] (after 3 days)    Consultations:    Procedures/Studies: DG Chest 2 View  Result Date: 03/09/2021 CLINICAL DATA:  Shortness of breath and cough for 4 days. EXAM: CHEST - 2 VIEW COMPARISON:  07/25/2020 FINDINGS: Heart size is within normal limits. Mild pulmonary hyperinflation is again seen. New band like opacity is seen in the medial left lower lobe, which may be due to atelectasis or infiltrate. Right lung remains clear. No evidence of pleural effusion. IMPRESSION: New atelectasis versus infiltrate in medial left lower lobe. Electronically Signed   By: Marlaine Hind M.D.   On: 03/09/2021 16:25   CT CHEST W CONTRAST  Result Date: 03/10/2021 CLINICAL DATA:  Cough with incompletely treated pneumonia, initial encounter EXAM: CT CHEST WITH CONTRAST TECHNIQUE: Multidetector CT imaging of the chest was performed during intravenous contrast administration. CONTRAST:  60mL OMNIPAQUE IOHEXOL 350 MG/ML SOLN COMPARISON:  Chest x-ray from earlier in the same day, CT from 07/12/2018 FINDINGS: Cardiovascular: Thoracic aorta demonstrates atherosclerotic  calcifications without aneurysmal dilatation. No dissection is identified. No cardiac enlargement is seen. No significant coronary calcifications are noted. Pulmonary artery was not timed for embolus evaluation although no central filling defect is seen. Mediastinum/Nodes: Thoracic inlet is within normal limits. No sizable hilar or mediastinal adenopathy is noted. Scattered small reactive lymph nodes are noted. Small sliding-type hiatal hernia is seen. Lungs/Pleura: Lungs are well aerated bilaterally. Some areas of scarring are noted in the right middle lobe and lateral aspect of  the left lower lobe and lingula similar to that seen on a prior CT from 2020. new linear area of scarring is noted which corresponds to that seen on recent plain film. No acute infiltrate or sizable effusion is noted. No sizable parenchymal nodules are noted. Upper Abdomen: Visualized upper abdomen shows no acute abnormality. Musculoskeletal: Degenerative changes of the thoracic spine are noted. IMPRESSION: Areas of scarring in the bases bilaterally. One of which corresponds with the findings on recent plain film examination. No acute infiltrate is seen. Aortic Atherosclerosis (ICD10-I70.0). Electronically Signed   By: Inez Catalina M.D.   On: 03/10/2021 00:58      Subjective: Reports nonproductive cough, overall breathing is better  Discharge Exam: Vitals:   03/12/21 0547 03/12/21 0840 03/12/21 0847 03/12/21 1446  BP: (!) 169/88   (!) 177/76  Pulse: 60   69  Resp: 14   20  Temp: 98.3 F (36.8 C)   98.2 F (36.8 C)  TempSrc: Oral   Oral  SpO2: 97% 97% 97% 94%  Weight:      Height:        General: Pt is alert, awake, not in acute distress Cardiovascular: RRR, S1/S2 +, no rubs, no gallops Respiratory: CTA bilaterally, no wheezing, no rhonchi Abdominal: Soft, NT, ND, bowel sounds + Extremities: no edema, no cyanosis    The results of significant diagnostics from this hospitalization (including imaging,  microbiology, ancillary and laboratory) are listed below for reference.     Microbiology: Recent Results (from the past 240 hour(s))  Resp Panel by RT-PCR (Flu A&B, Covid) Nasopharyngeal Swab     Status: None   Collection Time: 03/09/21  6:38 PM   Specimen: Nasopharyngeal Swab; Nasopharyngeal(NP) swabs in vial transport medium  Result Value Ref Range Status   SARS Coronavirus 2 by RT PCR NEGATIVE NEGATIVE Final    Comment: (NOTE) SARS-CoV-2 target nucleic acids are NOT DETECTED.  The SARS-CoV-2 RNA is generally detectable in upper respiratory specimens during the acute phase of infection. The lowest concentration of SARS-CoV-2 viral copies this assay can detect is 138 copies/mL. A negative result does not preclude SARS-Cov-2 infection and should not be used as the sole basis for treatment or other patient management decisions. A negative result may occur with  improper specimen collection/handling, submission of specimen other than nasopharyngeal swab, presence of viral mutation(s) within the areas targeted by this assay, and inadequate number of viral copies(<138 copies/mL). A negative result must be combined with clinical observations, patient history, and epidemiological information. The expected result is Negative.  Fact Sheet for Patients:  EntrepreneurPulse.com.au  Fact Sheet for Healthcare Providers:  IncredibleEmployment.be  This test is no t yet approved or cleared by the [place] FDA and  has been authorized for detection and/or diagnosis of SARS-CoV-2 by FDA under an Emergency Use Authorization (EUA). This EUA will remain  in effect (meaning this test can be used) for the duration of the COVID-19 declaration under Section 564(b)(1) of the Act, 21 U.S.C.section 360bbb-3(b)(1), unless the authorization is terminated  or revoked sooner.       Influenza A by PCR NEGATIVE NEGATIVE Final   Influenza B by PCR NEGATIVE NEGATIVE  Final    Comment: (NOTE) The Xpert Xpress SARS-CoV-2/FLU/RSV plus assay is intended as an aid in the diagnosis of influenza from Nasopharyngeal swab specimens and should not be used as a sole basis for treatment. Nasal washings and aspirates are unacceptable for Xpert Xpress SARS-CoV-2/FLU/RSV testing.  Fact Sheet for Patients: EntrepreneurPulse.com.au  Fact Sheet for Healthcare Providers: IncredibleEmployment.be  This test is not yet approved or cleared by the [place] FDA and has been authorized for detection and/or diagnosis of SARS-CoV-2 by FDA under an Emergency Use Authorization (EUA). This EUA will remain in effect (meaning this test can be used) for the duration of the COVID-19 declaration under Section 564(b)(1) of the Act, 21 U.S.C. section 360bbb-3(b)(1), unless the authorization is terminated or revoked.  Performed at [facility], [place] [address], [place], [place] [address]   Culture, blood (routine x 2)     Status: None (Preliminary result)   Collection Time: 03/09/21  9:28 PM   Specimen: BLOOD  Result Value Ref Range Status   Specimen Description   Final    BLOOD LEFT ANTECUBITAL Performed at [place] [address] [place], [place] [address]    Special Requests   Final    BOTTLES DRAWN AEROBIC AND ANAEROBIC Blood Culture adequate volume Performed at [place] [address], [place], [place] [address]    Culture   Final    NO GROWTH 3 DAYS Performed at [facility], [place] [address], [place], [place] [address]    Report Status PENDING  Incomplete  Respiratory (~20 pathogens) panel by PCR     Status: Abnormal   Collection Time: 03/09/21  9:42 PM   Specimen: Respiratory  Result Value Ref Range Status   Adenovirus NOT DETECTED NOT DETECTED Final   Coronavirus 229E NOT DETECTED NOT DETECTED Final    Comment: (NOTE) The Coronavirus on the  Respiratory Panel, DOES NOT test for the novel  Coronavirus (2019 nCoV)    Coronavirus HKU1 NOT DETECTED NOT DETECTED Final   Coronavirus NL63 NOT DETECTED NOT DETECTED Final   Coronavirus OC43 NOT DETECTED NOT DETECTED Final   Metapneumovirus NOT DETECTED NOT DETECTED Final   Rhinovirus / Enterovirus NOT DETECTED NOT DETECTED Final   Influenza A NOT DETECTED NOT DETECTED Final   Influenza B NOT DETECTED NOT DETECTED Final   Parainfluenza Virus 1 NOT DETECTED NOT DETECTED Final   Parainfluenza Virus 2 NOT DETECTED NOT DETECTED Final   Parainfluenza Virus 3 NOT DETECTED NOT DETECTED Final   Parainfluenza Virus 4 NOT DETECTED NOT DETECTED Final   Respiratory Syncytial Virus DETECTED (A) NOT DETECTED Final   Bordetella pertussis NOT DETECTED NOT DETECTED Final   Bordetella Parapertussis NOT DETECTED NOT DETECTED Final   Chlamydophila pneumoniae NOT DETECTED NOT DETECTED Final   Mycoplasma pneumoniae NOT DETECTED NOT DETECTED Final    Comment: Performed at [facility], [place]. [address], [place], [place] [address]     Labs: BNP (last 3 results) Recent Labs    03/09/21 1838  BNP 09.3   Basic Metabolic Panel: Recent Labs  Lab 03/09/21 1838 03/10/21 1750 03/12/21 0453  NA 129* 130* 133*  K 4.4 4.7 3.9  CL 92* 91* 97*  CO2 31 29 30   GLUCOSE 104* 177* 154*  BUN 12 20 25*  CREATININE 0.68 0.86 0.80  CALCIUM 9.1 10.0 8.8*  MG  --  2.0  --    Liver Function Tests: Recent Labs  Lab 03/10/21 1750  AST 42*  ALT 28  ALKPHOS 79  BILITOT 0.5  PROT 8.3*  ALBUMIN 4.6   No results for input(s): LIPASE, AMYLASE in the last 168 hours. No results for input(s): AMMONIA in the last 168 hours. CBC: Recent Labs  Lab 03/09/21 1838 03/10/21 1750  WBC 3.2* 8.0  NEUTROABS 1.6* 6.9  HGB 13.6 14.5  HCT 39.6 42.7  MCV 88.6 88.4  PLT 175 233   Cardiac Enzymes: No results for input(s): CKTOTAL, CKMB, CKMBINDEX, TROPONINI in the last 168 hours. BNP: Invalid input(s):  POCBNP CBG: No results for input(s): GLUCAP in the last 168 hours. D-Dimer No results for input(s): DDIMER in the last 72 hours. Hgb A1c No results for input(s): HGBA1C in the last 72 hours. Lipid Profile No results for input(s): CHOL, HDL, LDLCALC, TRIG, CHOLHDL, LDLDIRECT in the last 72 hours. Thyroid function studies Recent Labs    03/11/21 1439  TSH 0.213*   Anemia work up No results for input(s): VITAMINB12, FOLATE, FERRITIN, TIBC, IRON, RETICCTPCT in the last 72 hours. Urinalysis    Component Value Date/Time   COLORURINE AMBER (A) 07/25/2020 2214   APPEARANCEUR CLOUDY (A) 07/25/2020 2214   LABSPEC 1.023 07/25/2020 2214   PHURINE 5.0 07/25/2020 2214   GLUCOSEU NEGATIVE 07/25/2020 2214   GLUCOSEU NEGATIVE 04/03/2020 1202   HGBUR NEGATIVE 07/25/2020 2214   BILIRUBINUR NEGATIVE 07/25/2020 2214   KETONESUR 20 (A) 07/25/2020 2214   PROTEINUR 100 (A) 07/25/2020 2214   UROBILINOGEN 0.2 04/03/2020 1202   NITRITE NEGATIVE 07/25/2020 2214   LEUKOCYTESUR NEGATIVE 07/25/2020 2214   Sepsis Labs Invalid input(s): PROCALCITONIN,  WBC,  LACTICIDVEN Microbiology Recent Results (from the past 240 hour(s))  Resp Panel by RT-PCR (Flu A&B, Covid) Nasopharyngeal Swab     Status: None   Collection Time: 03/09/21  6:38 PM   Specimen: Nasopharyngeal Swab; Nasopharyngeal(NP) swabs in vial transport medium  Result Value Ref Range Status   SARS Coronavirus 2 by RT PCR NEGATIVE NEGATIVE Final    Comment: (NOTE) SARS-CoV-2 target nucleic acids are NOT DETECTED.  The SARS-CoV-2 RNA is generally detectable in upper respiratory specimens during the acute phase of infection. The lowest concentration of SARS-CoV-2 viral copies this assay can detect is 138 copies/mL. A negative result does not preclude SARS-Cov-2 infection and should not be used as the sole basis for treatment or other patient management decisions. A negative result may occur with  improper specimen collection/handling,  submission of specimen other than nasopharyngeal swab, presence of viral mutation(s) within the areas targeted by this assay, and inadequate number of viral copies(<138 copies/mL). A negative result must be combined with clinical observations, patient history, and epidemiological information. The expected result is Negative.  Fact Sheet for Patients:  EntrepreneurPulse.com.au  Fact Sheet for Healthcare Providers:  IncredibleEmployment.be  This test is no t yet approved or cleared by the [place] FDA and  has been authorized for detection and/or diagnosis of SARS-CoV-2 by FDA under an Emergency Use Authorization (EUA). This EUA will remain  in effect (meaning this test can be used) for the duration of the COVID-19 declaration under Section 564(b)(1) of the Act, 21 U.S.C.section 360bbb-3(b)(1), unless the authorization is terminated  or revoked sooner.       Influenza A by PCR NEGATIVE NEGATIVE Final   Influenza B by PCR NEGATIVE NEGATIVE Final    Comment: (NOTE) The Xpert Xpress SARS-CoV-2/FLU/RSV plus assay is intended as an aid in the diagnosis of influenza from Nasopharyngeal swab specimens and should not be used as a sole basis for treatment. Nasal washings and aspirates are unacceptable for Xpert Xpress SARS-CoV-2/FLU/RSV testing.  Fact Sheet for Patients: EntrepreneurPulse.com.au  Fact Sheet for Healthcare Providers: IncredibleEmployment.be  This test is not yet approved or cleared by the [place] FDA and has been authorized for detection and/or diagnosis of SARS-CoV-2  by FDA under an Emergency Use Authorization (EUA). This EUA will remain in effect (meaning this test can be used) for the duration of the COVID-19 declaration under Section 564(b)(1) of the Act, 21 U.S.C. section 360bbb-3(b)(1), unless the authorization is terminated or revoked.  Performed at [facility], [place] [address], [place], [place] [address]   Culture, blood (routine x 2)     Status: None (Preliminary result)   Collection Time: 03/09/21  9:28 PM   Specimen: BLOOD  Result Value Ref Range Status   Specimen Description   Final    BLOOD LEFT ANTECUBITAL Performed at [place] [address] [place], [place] [address]    Special Requests   Final    BOTTLES DRAWN AEROBIC AND ANAEROBIC Blood Culture adequate volume Performed at [place] [address], [place], [place] [address]    Culture   Final    NO GROWTH 3 DAYS Performed at [facility], [place] [address] [place], [place] [address]    Report Status PENDING  Incomplete  Respiratory (~20 pathogens) panel by PCR     Status: Abnormal   Collection Time: 03/09/21  9:42 PM   Specimen: Respiratory  Result Value Ref Range Status   Adenovirus NOT DETECTED NOT DETECTED Final   Coronavirus 229E NOT DETECTED NOT DETECTED Final    Comment: (NOTE) The Coronavirus on the Respiratory Panel, DOES NOT test for the novel  Coronavirus (2019 nCoV)    Coronavirus HKU1 NOT DETECTED NOT DETECTED Final   Coronavirus NL63 NOT DETECTED NOT DETECTED Final   Coronavirus OC43 NOT DETECTED NOT DETECTED Final   Metapneumovirus NOT DETECTED NOT DETECTED Final   Rhinovirus / Enterovirus NOT DETECTED NOT DETECTED Final   Influenza A NOT DETECTED NOT DETECTED Final   Influenza B NOT DETECTED NOT DETECTED Final   Parainfluenza Virus 1 NOT DETECTED NOT DETECTED Final   Parainfluenza Virus 2 NOT DETECTED NOT DETECTED Final   Parainfluenza Virus 3 NOT DETECTED NOT DETECTED Final   Parainfluenza Virus 4 NOT DETECTED NOT DETECTED Final   Respiratory Syncytial Virus DETECTED (A) NOT DETECTED Final   Bordetella pertussis NOT DETECTED NOT DETECTED Final   Bordetella Parapertussis NOT DETECTED NOT DETECTED Final   Chlamydophila pneumoniae NOT DETECTED NOT DETECTED Final   Mycoplasma pneumoniae NOT  DETECTED NOT DETECTED Final    Comment: Performed at [facility], [place]. [address], [place], [place] [address]     Time coordinating discharge: 17mins  SIGNED:   Kathie Dike, MD  [facility] 03/13/2021, 8:49 PM   If 7PM-7AM, please contact night-coverage [email or web address]

## 2021-03-13 NOTE — Telephone Encounter (Signed)
Transition Care Management Unsuccessful Follow-up Telephone Call  Date of discharge and from where:  03/12/21 Bianca Haley  Attempts:  1st Attempt  Reason for unsuccessful TCM follow-up call:  Left voice message

## 2021-03-14 NOTE — Telephone Encounter (Signed)
Transition Care Management Follow-up Telephone Call Date of discharge and from where: 29-Mar-2021 How have you been since you were released from the hospital? Pt states she is doing okay but still feels weak and tired. Pt taking prednisone, mucinex and hycodan for cough/congestion. Husband passed away on Mar 29, 2021 while also in the hospital. Pt aware to contact office for grief counseling resources if needed. Any questions or concerns? No  Items Reviewed: Did the pt receive and understand the discharge instructions provided? Yes  Medications obtained and verified? Yes  Other? No  Any new allergies since your discharge? No  Dietary orders reviewed? Yes Do you have support at home? Yes   Home Care and Equipment/Supplies: Were home health services ordered? no Were any new equipment or medical supplies ordered?  No - pt already had oxygen at home at night  Functional Questionnaire: (I = Independent and D = Dependent) ADLs: I  Bathing/Dressing- I  Meal Prep- I  Eating- I  Maintaining continence- I  Transferring/Ambulation- I  Managing Meds- I  Follow up appointments reviewed:  PCP Hospital f/u appt confirmed? Yes  Scheduled to see Dr. Jenny Reichmann on 03/17/21 @ 3:40. [facility] f/u appt confirmed? No   Are transportation arrangements needed? No  If their condition worsens, is the pt aware to call PCP or go to the Emergency Dept.? Yes Was the patient provided with contact information for the PCP's office or ED? Yes Was to pt encouraged to call back with questions or concerns? Yes

## 2021-03-14 NOTE — Telephone Encounter (Signed)
Noted. Thanks.

## 2021-03-15 LAB — CULTURE, BLOOD (ROUTINE X 2)
Culture: NO GROWTH
Special Requests: ADEQUATE

## 2021-03-17 ENCOUNTER — Encounter: Payer: Self-pay | Admitting: Internal Medicine

## 2021-03-17 ENCOUNTER — Ambulatory Visit (INDEPENDENT_AMBULATORY_CARE_PROVIDER_SITE_OTHER): Payer: Medicare Other | Admitting: Internal Medicine

## 2021-03-17 ENCOUNTER — Other Ambulatory Visit: Payer: Self-pay

## 2021-03-17 VITALS — BP 112/60 | HR 68 | Temp 99.1°F | Ht 65.0 in | Wt 143.0 lb

## 2021-03-17 DIAGNOSIS — F5101 Primary insomnia: Secondary | ICD-10-CM | POA: Diagnosis not present

## 2021-03-17 DIAGNOSIS — J9611 Chronic respiratory failure with hypoxia: Secondary | ICD-10-CM

## 2021-03-17 DIAGNOSIS — J189 Pneumonia, unspecified organism: Secondary | ICD-10-CM | POA: Diagnosis not present

## 2021-03-17 DIAGNOSIS — Z20822 Contact with and (suspected) exposure to covid-19: Secondary | ICD-10-CM | POA: Diagnosis not present

## 2021-03-17 DIAGNOSIS — F4321 Adjustment disorder with depressed mood: Secondary | ICD-10-CM

## 2021-03-17 MED ORDER — DOXYCYCLINE HYCLATE 100 MG PO TABS: 100.0000 mg | ORAL_TABLET | Freq: Two times a day (BID) | ORAL | 0 refills | Status: DC

## 2021-03-17 MED ORDER — DIAZEPAM 5 MG PO TABS: 5.0000 mg | ORAL_TABLET | Freq: Every day | ORAL | 0 refills | Status: DC | PRN

## 2021-03-17 MED ORDER — HYDROCODONE BIT-HOMATROP MBR 5-1.5 MG/5ML PO SOLN: 5.0000 mL | Freq: Two times a day (BID) | ORAL | 0 refills | Status: DC | PRN

## 2021-03-17 MED ORDER — PREDNISONE 10 MG PO TABS: ORAL_TABLET | ORAL | 0 refills | Status: DC

## 2021-03-17 MED ORDER — ESZOPICLONE 2 MG PO TABS: 2.0000 mg | ORAL_TABLET | Freq: Every evening | ORAL | 0 refills | Status: DC | PRN

## 2021-03-17 NOTE — Patient Instructions (Signed)
Please take all new medication as prescribed - the antibiotic, cough medicine, and prednisone  Please take all new medication as prescribed - the lunesta for sleep as needed as well  Please continue all other medications as before, and refills have been done if requested.  Please have the pharmacy call with any other refills you may need.  Please continue your efforts at being more active, low cholesterol diet, and weight control.  Please keep your appointments with your specialists as you may have planned  Please make an Appointment to return in 2 weeks to see Dr Alain Marion

## 2021-03-17 NOTE — Progress Notes (Signed)
Patient ID: Bianca Haley, female   DOB: [birth year], 70 y.o.   MRN: [id number]        Chief Complaint: follow up recent hospn -        HPI:  Bianca Haley is a 70 y.o. female here with above from nov 6 - 9 after presenting with complaints of cough from a local urgent care clinic due to concerns for incompletely treated pneumonia.  Ct chest with contrast showed Areas of scarring in the bases bilaterally. One of which corresponds with the findings on recent plain film examination. No acute infiltrate is seen.  Pt did have mild hypoxia due to RSV tx with o2 and steroid tx and bronchodilators and transitioned to prednisone taper which she is finishing. Still on home o2 1L qhs.    Initial low Na also improved to 133 at d/c.  Since d/c, Pt denies chest pain, increased sob or doe, wheezing, orthopnea, PND, increased LE swelling, palpitations, dizziness or syncope.   Pt denies polydipsia, polyuria, or new focal neuro s/s.  Grieving today as unfortunately husband died in the hospital while she was a patient there as well.  Asks for valium refill.  Denies worsening suicidal ideation, or panic;  Has still difficulty sleeping on 150 mg trazodone qhs, asks for change.         Wt Readings from Last 3 Encounters:  03/17/21 143 lb (64.9 kg)  03/09/21 142 lb (64.4 kg)  01/02/21 139 lb 12.8 oz (63.4 kg)   BP Readings from Last 3 Encounters:  03/17/21 112/60  03/12/21 (!) 177/76  01/02/21 140/82         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   Hemorrhoids    History of colon polyps    History of endometriosis    History of hiatal hernia    History of uterine fibroid    History of UTI     Hypertension    Memory loss    Metabolic encephalopathy    History of: resolved   Migraine    Recurrent pneumonia 07/2018   Seasonal allergies    Vitamin D deficiency    Wears glasses    Past Surgical History:  Procedure Laterality Date   ABDOMINAL HYSTERECTOMY     BACK SURGERY     BIOPSY  10/20/2018   Procedure: BIOPSY;  Surgeon: [name], MD;  Location: [facility];  Service: Endoscopy;;   BLADDER SUSPENSION     COLONOSCOPY  02/16/2008   normal   DENTAL SURGERY     ESOPHAGOGASTRODUODENOSCOPY (EGD) WITH PROPOFOL N/A 10/20/2018   Procedure: ESOPHAGOGASTRODUODENOSCOPY (EGD) WITH PROPOFOL;  Surgeon: [name], MD;  Location: [facility];  Service: Endoscopy;  Laterality: N/A;   EUS N/A 10/20/2018   Procedure: UPPER ENDOSCOPIC ULTRASOUND (EUS) RADIAL;  Surgeon: [name], MD;  Location: [facility];  Service: Endoscopy;  Laterality: N/A;   FOOT SURGERY Left    x3   laparoscopy     for evaluation of endometriosis   TOTAL KNEE ARTHROPLASTY Left 07/15/2015  Procedure: TOTAL LEFT KNEE ARTHROPLASTY;  Surgeon: Paralee Cancel, MD;  Location: [facility];  Service: Orthopedics;  Laterality: Left;   UPPER GI ENDOSCOPY      reports that she has never smoked. She has never used smokeless tobacco. She reports that she does not drink alcohol and does not use drugs. family history includes Emphysema in her mother; Heart disease (age of onset: 61) in her mother. Allergies  Allergen Reactions   Penicillins Shortness Of Breath and Rash    Did it involve swelling of the face/tongue/throat, SOB, or low BP? No Did it involve sudden or severe rash/hives, skin peeling, or any reaction on the inside of your mouth or nose? Yes Did you need to seek medical attention at a hospital or doctor's office? No When did it last happen?      10 + years If all above answers are "NO", may proceed with cephalosporin use.     Morphine And Related Swelling    Sedation/Swelling described like  edema/bloating/doesn't help the pain Morphine only; tolerates other opioids.    Cefepime Rash    Reaction while taking both cefepime and vancomycin at [facility] (after 3 days)   Vancomycin Rash    Reaction while taking both cefepime and vancomycin at [facility] (after 3 days)   Current Outpatient Medications on File Prior to Visit  Medication Sig Dispense Refill   acetaminophen (TYLENOL) 650 MG CR tablet Take 1,300 mg by mouth every 8 (eight) hours as needed for pain.     albuterol (VENTOLIN HFA) 108 (90 Base) MCG/ACT inhaler INHALE 2 PUFFS INTO THE LUNGS EVERY 4 HOURS AS NEEDED FOR WHEEZING OR SHORTNESS OF BREATH (Patient taking differently: Inhale 2 puffs into the lungs every 4 (four) hours as needed for wheezing or shortness of breath.) 18 g 2   Ascorbic Acid (VITAMIN C PO) Take 1 tablet by mouth every morning.     atenolol (TENORMIN) 100 MG tablet TAKE 1 TABLET(100 MG) BY MOUTH EVERY DAY (Patient taking differently: Take 100 mg by mouth every morning.) 90 tablet 3   Calcium Carb-Cholecalciferol (CALCIUM PLUS VITAMIN D3) 600-500 MG-UNIT CAPS Take 2 tablets by mouth daily.     Cholecalciferol (VITAMIN D) 2000 units tablet Take 2,000 Units by mouth at bedtime.      cyanocobalamin 1000 MCG tablet Take 1,000 mcg by mouth daily. Vitamin b12     cycloSPORINE (RESTASIS) 0.05 % ophthalmic emulsion Place 1 drop into both eyes 2 (two) times daily.     diltiazem (CARDIZEM CD) 180 MG 24 hr capsule TAKE 1 CAPSULE(180 MG) BY MOUTH DAILY (Patient taking differently: Take 180 mg by mouth every morning.) 90 capsule 3   fluticasone (FLONASE) 50 MCG/ACT nasal spray Place 2 sprays into both nostrils daily as needed for allergies or rhinitis.     gabapentin (NEURONTIN) 300 MG capsule Take 4 capsules (1,200 mg total) by mouth at bedtime. 120 capsule 0   guaiFENesin (MUCINEX) 600 MG 12 hr tablet Take 1 tablet (600 mg total) by mouth 2 (two) times daily. 60 tablet 2   Melatonin 10 MG SUBL Place 10 mg under the tongue at  bedtime.     mirabegron ER (MYRBETRIQ) 50 MG TB24 tablet Take 50 mg by mouth every morning.     Multiple Vitamin (MULTIVITAMIN WITH MINERALS) TABS tablet Take 1 tablet by mouth 2 (two) times daily.     OVER THE COUNTER MEDICATION Take 2 capsules by mouth in the morning. [employer]     OXYGEN Inhale  1 L into the lungs See admin instructions. Overnight     oxymorphone (OPANA) 10 MG tablet Take 1 tablet (10 mg total) by mouth daily as needed for pain. (Patient taking differently: Take 5 mg by mouth See admin instructions. Take 1/2 tablet (5 mg) by mouth every morning, take 1/2 tablet (5 mg) 2 hours later if still needed; may also take 1/2 tablet (5 mg) in the afternoon as needed for pain) 1 tablet 0   pantoprazole (PROTONIX) 40 MG tablet TAKE 1 TABLET(40 MG) BY MOUTH TWICE DAILY BEFORE A MEAL (Patient taking differently: Take 40 mg by mouth 2 (two) times daily before a meal.) 180 tablet 3   PARoxetine (PAXIL) 40 MG tablet TAKE 1 TABLET(40 MG) BY MOUTH TWICE DAILY (Patient taking differently: Take 40 mg by mouth 2 (two) times daily.) 180 tablet 3   Polyethyl Glycol-Propyl Glycol (SYSTANE OP) Place 1 drop into both eyes 2 (two) times daily.     polyethylene glycol (MIRALAX / GLYCOLAX) packet Take 17 g by mouth 2 (two) times daily. (Patient taking differently: Take 17 g by mouth daily as needed (constipation). Mix in 8 oz liquid and drink) 14 each 0   potassium chloride (KLOR-CON) 10 MEQ tablet TAKE 1 TABLET(10 MEQ) BY MOUTH DAILY (Patient taking differently: Take 10 mEq by mouth every morning.) 90 tablet 2   Probiotic CAPS Take 1 capsule by mouth every morning.     rosuvastatin (CRESTOR) 5 MG tablet TAKE 1 TABLET(5 MG) BY MOUTH DAILY (Patient taking differently: Take 5 mg by mouth every morning.) 90 tablet 3   SUMAtriptan (IMITREX) 100 MG tablet TAKE 1 TABLET BY MOUTH DAILY AS NEEDED FOR MIGRAINE. MAY REPEAT IN 2 HOURS IF HEADACHE PERSISTS OR RECURS (Patient taking differently: Take 100 mg by mouth See admin  instructions. Take one tablet (100 mg) by mouth at onset of migraine headache, may repeat in 2 hours if headache persists or recurs) 12 tablet 5   traZODone (DESYREL) 150 MG tablet TAKE 1 TABLET BY MOUTH AT BEDTIME AS NEEDED FOR SLEEP (Patient taking differently: Take 150 mg by mouth at bedtime.) 30 tablet 11   valACYclovir (VALTREX) 500 MG tablet Take 500 mg by mouth See admin instructions. Take one tablet (500 mg) by mouth twice daily for 3-5 days as needed for outbreaks     No current facility-administered medications on file prior to visit.        ROS:  All others reviewed and negative.  Objective        PE:  BP 112/60 (BP Location: Right Arm, Patient Position: Sitting, Cuff Size: Normal)   Pulse 68   Temp 99.1 F (37.3 C) (Oral)   Ht 5\' 5"  (1.651 m)   Wt 143 lb (64.9 kg)   SpO2 96%   BMI 23.80 kg/m                 Constitutional: Pt appears in NAD               HENT: Head: NCAT.                Right Ear: External ear normal.                 Left Ear: External ear normal.                Eyes: . Pupils are equal, round, and reactive to light. Conjunctivae and EOM are normal  Nose: without d/c or deformity               Neck: Neck supple. Gross normal ROM               Cardiovascular: Normal rate and regular rhythm.                 Pulmonary/Chest: Effort normal and breath sounds wit few LLL rales or wheezing.                Abd:  Soft, NT, ND, + BS, no organomegaly               Neurological: Pt is alert. At baseline orientation, motor grossly intact               Skin: Skin is warm. No rashes, no other new lesions, LE edema - none               Psychiatric: Pt behavior is normal without agitation   Micro: none  Cardiac tracings I have personally interpreted today:  none  Pertinent Radiological findings (summarize): none   Lab Results  Component Value Date   WBC 8.0 03/10/2021   HGB 14.5 03/10/2021   HCT 42.7 03/10/2021   PLT 233 03/10/2021   GLUCOSE 154  (H) 03/12/2021   CHOL 155 04/03/2020   TRIG 102.0 04/03/2020   HDL 61.40 04/03/2020   LDLCALC 73 04/03/2020   ALT 28 03/10/2021   AST 42 (H) 03/10/2021   NA 133 (L) 03/12/2021   K 3.9 03/12/2021   CL 97 (L) 03/12/2021   CREATININE 0.80 03/12/2021   BUN 25 (H) 03/12/2021   CO2 30 03/12/2021   TSH 0.213 (L) 03/11/2021   INR 1.0 09/21/2018   HGBA1C 6.1 06/23/2018   Assessment/Plan:  Bianca Haley is a 70 y.o. White or Caucasian [1] female with  has a past medical history of Anemia, Anxiety, Arthritis, Chronic low back pain, Chronic renal failure, Diverticulosis, Dupuytren contracture, GERD (gastroesophageal reflux disease), H/O tinnitus, Hearing loss, Hemorrhoids, History of colon polyps, History of endometriosis, History of hiatal hernia, History of uterine fibroid, History of UTI, Hypertension, Memory loss, Metabolic encephalopathy, Migraine, Recurrent pneumonia (07/2018), Seasonal allergies, Vitamin D deficiency, and Wears glasses.  Pneumonia of left lower lobe due to infectious organism Clinical diagnosis, with possible recurring - for doxy course, predpac, cough med prn  Chronic hypoxemic respiratory failure (HCC) Chronic, stabe, for home o2 1 L qhs to continue  Insomnia Uncontrolled now with grief as well - for trial change of trazodone to lunesta 2 mg qhs prn  Grief reaction Today for valium refill, declines need for counseling or psychiatric referral  Followup: Return in about 2 weeks (around 03/31/2021) for to PCP.  Cathlean Cower, MD 03/23/2021 8:42 PM [facility]

## 2021-03-18 ENCOUNTER — Telehealth: Payer: Self-pay | Admitting: Internal Medicine

## 2021-03-18 NOTE — Telephone Encounter (Signed)
Patient states pharmacy is requiring a prior auth on rx eszopiclone (LUNESTA) 2 MG TABS tablet    [place], [place]

## 2021-03-20 DIAGNOSIS — L905 Scar conditions and fibrosis of skin: Secondary | ICD-10-CM | POA: Diagnosis not present

## 2021-03-20 DIAGNOSIS — L821 Other seborrheic keratosis: Secondary | ICD-10-CM | POA: Diagnosis not present

## 2021-03-20 DIAGNOSIS — Z85828 Personal history of other malignant neoplasm of skin: Secondary | ICD-10-CM | POA: Diagnosis not present

## 2021-03-20 DIAGNOSIS — D1801 Hemangioma of skin and subcutaneous tissue: Secondary | ICD-10-CM | POA: Diagnosis not present

## 2021-03-20 DIAGNOSIS — D485 Neoplasm of uncertain behavior of skin: Secondary | ICD-10-CM | POA: Diagnosis not present

## 2021-03-20 DIAGNOSIS — D225 Melanocytic nevi of trunk: Secondary | ICD-10-CM | POA: Diagnosis not present

## 2021-03-20 DIAGNOSIS — L718 Other rosacea: Secondary | ICD-10-CM | POA: Diagnosis not present

## 2021-03-20 DIAGNOSIS — L570 Actinic keratosis: Secondary | ICD-10-CM | POA: Diagnosis not present

## 2021-03-20 DIAGNOSIS — L814 Other melanin hyperpigmentation: Secondary | ICD-10-CM | POA: Diagnosis not present

## 2021-03-23 ENCOUNTER — Encounter: Payer: Self-pay | Admitting: Internal Medicine

## 2021-03-23 DIAGNOSIS — G4700 Insomnia, unspecified: Secondary | ICD-10-CM | POA: Insufficient documentation

## 2021-03-23 DIAGNOSIS — J9611 Chronic respiratory failure with hypoxia: Secondary | ICD-10-CM | POA: Insufficient documentation

## 2021-03-23 DIAGNOSIS — F4321 Adjustment disorder with depressed mood: Secondary | ICD-10-CM | POA: Insufficient documentation

## 2021-03-23 NOTE — Assessment & Plan Note (Signed)
Today for valium refill, declines need for counseling or psychiatric referral

## 2021-03-23 NOTE — Assessment & Plan Note (Signed)
Uncontrolled now with grief as well - for trial change of trazodone to lunesta 2 mg qhs prn

## 2021-03-23 NOTE — Assessment & Plan Note (Signed)
Chronic, stabe, for home o2 1 L qhs to continue

## 2021-03-23 NOTE — Assessment & Plan Note (Signed)
Clinical diagnosis, with possible recurring - for doxy course, predpac, cough med prn

## 2021-03-24 DIAGNOSIS — Z20828 Contact with and (suspected) exposure to other viral communicable diseases: Secondary | ICD-10-CM | POA: Diagnosis not present

## 2021-03-25 NOTE — Telephone Encounter (Signed)
PA was submitted via cover my meds w/ Key: [id number] - Rx #: [id number]. Received msg stating "Med ha been approved  Effective from 03/21/2021 through 03/21/2022." ...Bianca Haley

## 2021-03-31 DIAGNOSIS — Z20828 Contact with and (suspected) exposure to other viral communicable diseases: Secondary | ICD-10-CM | POA: Diagnosis not present

## 2021-03-31 DIAGNOSIS — Z1159 Encounter for screening for other viral diseases: Secondary | ICD-10-CM | POA: Diagnosis not present

## 2021-04-01 DIAGNOSIS — M542 Cervicalgia: Secondary | ICD-10-CM | POA: Diagnosis not present

## 2021-04-03 ENCOUNTER — Ambulatory Visit (INDEPENDENT_AMBULATORY_CARE_PROVIDER_SITE_OTHER): Payer: Medicare Other | Admitting: Internal Medicine

## 2021-04-03 ENCOUNTER — Encounter: Payer: Self-pay | Admitting: Internal Medicine

## 2021-04-03 ENCOUNTER — Other Ambulatory Visit: Payer: Self-pay

## 2021-04-03 DIAGNOSIS — F419 Anxiety disorder, unspecified: Secondary | ICD-10-CM | POA: Diagnosis not present

## 2021-04-03 DIAGNOSIS — F5101 Primary insomnia: Secondary | ICD-10-CM | POA: Diagnosis not present

## 2021-04-03 DIAGNOSIS — F4321 Adjustment disorder with depressed mood: Secondary | ICD-10-CM | POA: Diagnosis not present

## 2021-04-03 DIAGNOSIS — I10 Essential (primary) hypertension: Secondary | ICD-10-CM

## 2021-04-03 DIAGNOSIS — F4320 Adjustment disorder, unspecified: Secondary | ICD-10-CM

## 2021-04-03 DIAGNOSIS — N183 Chronic kidney disease, stage 3 unspecified: Secondary | ICD-10-CM

## 2021-04-03 DIAGNOSIS — R413 Other amnesia: Secondary | ICD-10-CM

## 2021-04-03 MED ORDER — DIAZEPAM 5 MG PO TABS: 5.0000 mg | ORAL_TABLET | Freq: Every day | ORAL | 3 refills | Status: DC | PRN

## 2021-04-03 NOTE — Assessment & Plan Note (Signed)
Worse Lunesta prn - Dr Jenny Reichmann prescribed Richardson Landry died in March 30, 2023. Counseling was offered

## 2021-04-03 NOTE — Assessment & Plan Note (Signed)
Stable

## 2021-04-03 NOTE — Assessment & Plan Note (Signed)
Richardson Landry died in 11/22. Discussed Counseling was offered

## 2021-04-03 NOTE — Assessment & Plan Note (Signed)
Cont w/Atenolol, Diltiazem,  Maxzide 

## 2021-04-03 NOTE — Assessment & Plan Note (Signed)
Counseling was offered

## 2021-04-03 NOTE — Assessment & Plan Note (Signed)
Lunesta prn - Dr Jenny Reichmann prescribed Bianca Haley died in 04/23/23. Counseling was offered

## 2021-04-03 NOTE — Progress Notes (Signed)
Subjective:  Patient ID: Bianca Haley, female    DOB: 1951-02-08  Age: 70 y.o. MRN: [id number]  CC: Follow-up (3 month f/u)   HPI Bianca Haley presents for grief  - Richardson Landry died in 2023/03/27. F/u on CAP, anxiety, insomnia, LBP  Outpatient Medications Prior to Visit  Medication Sig Dispense Refill   acetaminophen (TYLENOL) 650 MG CR tablet Take 1,300 mg by mouth every 8 (eight) hours as needed for pain.     albuterol (VENTOLIN HFA) 108 (90 Base) MCG/ACT inhaler INHALE 2 PUFFS INTO THE LUNGS EVERY 4 HOURS AS NEEDED FOR WHEEZING OR SHORTNESS OF BREATH (Patient taking differently: Inhale 2 puffs into the lungs every 4 (four) hours as needed for wheezing or shortness of breath.) 18 g 2   Ascorbic Acid (VITAMIN C PO) Take 1 tablet by mouth every morning.     atenolol (TENORMIN) 100 MG tablet TAKE 1 TABLET(100 MG) BY MOUTH EVERY DAY (Patient taking differently: Take 100 mg by mouth every morning.) 90 tablet 3   Calcium Carb-Cholecalciferol (CALCIUM PLUS VITAMIN D3) 600-500 MG-UNIT CAPS Take 2 tablets by mouth daily.     Cholecalciferol (VITAMIN D) 2000 units tablet Take 2,000 Units by mouth at bedtime.      cyanocobalamin 1000 MCG tablet Take 1,000 mcg by mouth daily. Vitamin b12     cycloSPORINE (RESTASIS) 0.05 % ophthalmic emulsion Place 1 drop into both eyes 2 (two) times daily.     diazepam (VALIUM) 5 MG tablet Take 1 tablet (5 mg total) by mouth daily as needed for anxiety or muscle spasms. 30 tablet 0   diltiazem (CARDIZEM CD) 180 MG 24 hr capsule TAKE 1 CAPSULE(180 MG) BY MOUTH DAILY (Patient taking differently: Take 180 mg by mouth every morning.) 90 capsule 3   eszopiclone (LUNESTA) 2 MG TABS tablet Take 1 tablet (2 mg total) by mouth at bedtime as needed for sleep. Take immediately before bedtime 30 tablet 0   fluticasone (FLONASE) 50 MCG/ACT nasal spray Place 2 sprays into both nostrils daily as needed for allergies or rhinitis.     gabapentin (NEURONTIN) 300 MG capsule Take 4 capsules  (1,200 mg total) by mouth at bedtime. 120 capsule 0   guaiFENesin (MUCINEX) 600 MG 12 hr tablet Take 1 tablet (600 mg total) by mouth 2 (two) times daily. 60 tablet 2   Melatonin 10 MG SUBL Place 10 mg under the tongue at bedtime.     mirabegron ER (MYRBETRIQ) 50 MG TB24 tablet Take 50 mg by mouth every morning.     Multiple Vitamin (MULTIVITAMIN WITH MINERALS) TABS tablet Take 1 tablet by mouth 2 (two) times daily.     OVER THE COUNTER MEDICATION Take 2 capsules by mouth in the morning. [facility]     OXYGEN Inhale 1 L into the lungs See admin instructions. Overnight     oxymorphone (OPANA) 10 MG tablet Take 1 tablet (10 mg total) by mouth daily as needed for pain. (Patient taking differently: Take 5 mg by mouth See admin instructions. Take 1/2 tablet (5 mg) by mouth every morning, take 1/2 tablet (5 mg) 2 hours later if still needed; may also take 1/2 tablet (5 mg) in the afternoon as needed for pain) 1 tablet 0   pantoprazole (PROTONIX) 40 MG tablet TAKE 1 TABLET(40 MG) BY MOUTH TWICE DAILY BEFORE A MEAL (Patient taking differently: Take 40 mg by mouth 2 (two) times daily before a meal.) 180 tablet 3   PARoxetine (PAXIL) 40 MG tablet  TAKE 1 TABLET(40 MG) BY MOUTH TWICE DAILY (Patient taking differently: Take 40 mg by mouth 2 (two) times daily.) 180 tablet 3   Polyethyl Glycol-Propyl Glycol (SYSTANE OP) Place 1 drop into both eyes 2 (two) times daily.     polyethylene glycol (MIRALAX / GLYCOLAX) packet Take 17 g by mouth 2 (two) times daily. (Patient taking differently: Take 17 g by mouth daily as needed (constipation). Mix in 8 oz liquid and drink) 14 each 0   potassium chloride (KLOR-CON) 10 MEQ tablet TAKE 1 TABLET(10 MEQ) BY MOUTH DAILY (Patient taking differently: Take 10 mEq by mouth every morning.) 90 tablet 2   Probiotic CAPS Take 1 capsule by mouth every morning.     rosuvastatin (CRESTOR) 5 MG tablet TAKE 1 TABLET(5 MG) BY MOUTH DAILY (Patient taking differently: Take 5 mg by mouth every  morning.) 90 tablet 3   SUMAtriptan (IMITREX) 100 MG tablet TAKE 1 TABLET BY MOUTH DAILY AS NEEDED FOR MIGRAINE. MAY REPEAT IN 2 HOURS IF HEADACHE PERSISTS OR RECURS (Patient taking differently: Take 100 mg by mouth See admin instructions. Take one tablet (100 mg) by mouth at onset of migraine headache, may repeat in 2 hours if headache persists or recurs) 12 tablet 5   traZODone (DESYREL) 150 MG tablet TAKE 1 TABLET BY MOUTH AT BEDTIME AS NEEDED FOR SLEEP (Patient taking differently: Take 150 mg by mouth at bedtime.) 30 tablet 11   valACYclovir (VALTREX) 500 MG tablet Take 500 mg by mouth See admin instructions. Take one tablet (500 mg) by mouth twice daily for 3-5 days as needed for outbreaks     doxycycline (VIBRA-TABS) 100 MG tablet Take 1 tablet (100 mg total) by mouth 2 (two) times daily. (Patient not taking: Reported on 04/03/2021) 20 tablet 0   HYDROcodone bit-homatropine (HYCODAN) 5-1.5 MG/5ML syrup Take 5 mLs by mouth every 12 (twelve) hours as needed for cough. (Patient not taking: Reported on 04/03/2021) 180 mL 0   predniSONE (DELTASONE) 10 MG tablet 3 tabs by mouth per day for 3 days,2tabs per day for 3 days,1tab per day for 3 days (Patient not taking: Reported on 04/03/2021) 18 tablet 0   No facility-administered medications prior to visit.    ROS: Review of Systems  Constitutional:  Positive for fatigue. Negative for activity change, appetite change, chills and unexpected weight change.  HENT:  Negative for congestion, mouth sores and sinus pressure.   Eyes:  Negative for visual disturbance.  Respiratory:  Negative for cough and chest tightness.   Gastrointestinal:  Negative for abdominal pain and nausea.  Genitourinary:  Negative for difficulty urinating, frequency and vaginal pain.  Musculoskeletal:  Positive for gait problem. Negative for back pain.  Skin:  Negative for pallor and rash.  Neurological:  Negative for dizziness, tremors, weakness, numbness and headaches.   Psychiatric/Behavioral:  Positive for decreased concentration. Negative for confusion, sleep disturbance and suicidal ideas. The patient is nervous/anxious.    Objective:  BP 130/72 (BP Location: Left Arm)   Pulse 62   Temp 97.8 F (36.6 C) (Oral)   Ht 5\' 5"  (1.651 m)   Wt 147 lb 6.4 oz (66.9 kg)   SpO2 94%   BMI 24.53 kg/m   BP Readings from Last 3 Encounters:  04/03/21 130/72  03/17/21 112/60  03/12/21 (!) 177/76    Wt Readings from Last 3 Encounters:  04/03/21 147 lb 6.4 oz (66.9 kg)  03/17/21 143 lb (64.9 kg)  03/09/21 142 lb (64.4 kg)    Physical  Exam Constitutional:      General: She is not in acute distress.    Appearance: She is well-developed.  HENT:     Head: Normocephalic.     Right Ear: External ear normal.     Left Ear: External ear normal.     Nose: Nose normal.  Eyes:     General:        Right eye: No discharge.        Left eye: No discharge.     Conjunctiva/sclera: Conjunctivae normal.     Pupils: Pupils are equal, round, and reactive to light.  Neck:     Thyroid: No thyromegaly.     Vascular: No JVD.     Trachea: No tracheal deviation.  Cardiovascular:     Rate and Rhythm: Normal rate and regular rhythm.     Heart sounds: Normal heart sounds.  Pulmonary:     Effort: No respiratory distress.     Breath sounds: No stridor. No wheezing.  Abdominal:     General: Bowel sounds are normal. There is no distension.     Palpations: Abdomen is soft. There is no mass.     Tenderness: There is no abdominal tenderness. There is no guarding or rebound.  Musculoskeletal:        General: Tenderness present.     Cervical back: Normal range of motion and neck supple. No rigidity.  Lymphadenopathy:     Cervical: No cervical adenopathy.  Skin:    Findings: No erythema or rash.  Neurological:     Mental Status: She is oriented to person, place, and time.     Cranial Nerves: No cranial nerve deficit.     Motor: No abnormal muscle tone.     Coordination:  Coordination abnormal.     Gait: Gait abnormal.     Deep Tendon Reflexes: Reflexes normal.  Psychiatric:        Behavior: Behavior normal.        Thought Content: Thought content normal.        Judgment: Judgment normal.  Using a walker LS w/pain Tearful    Lab Results  Component Value Date   WBC 8.0 03/10/2021   HGB 14.5 03/10/2021   HCT 42.7 03/10/2021   PLT 233 03/10/2021   GLUCOSE 154 (H) 03/12/2021   CHOL 155 04/03/2020   TRIG 102.0 04/03/2020   HDL 61.40 04/03/2020   LDLCALC 73 04/03/2020   ALT 28 03/10/2021   AST 42 (H) 03/10/2021   NA 133 (L) 03/12/2021   K 3.9 03/12/2021   CL 97 (L) 03/12/2021   CREATININE 0.80 03/12/2021   BUN 25 (H) 03/12/2021   CO2 30 03/12/2021   TSH 0.213 (L) 03/11/2021   INR 1.0 09/21/2018   HGBA1C 6.1 06/23/2018    DG Chest 2 View  Result Date: 03/09/2021 CLINICAL DATA:  Shortness of breath and cough for 4 days. EXAM: CHEST - 2 VIEW COMPARISON:  07/25/2020 FINDINGS: Heart size is within normal limits. Mild pulmonary hyperinflation is again seen. New band like opacity is seen in the medial left lower lobe, which may be due to atelectasis or infiltrate. Right lung remains clear. No evidence of pleural effusion. IMPRESSION: New atelectasis versus infiltrate in medial left lower lobe. Electronically Signed   By: Marlaine Hind M.D.   On: 03/09/2021 16:25   CT CHEST W CONTRAST  Result Date: 03/10/2021 CLINICAL DATA:  Cough with incompletely treated pneumonia, initial encounter EXAM: CT CHEST WITH CONTRAST TECHNIQUE: Multidetector CT imaging  of the chest was performed during intravenous contrast administration. CONTRAST:  33mL OMNIPAQUE IOHEXOL 350 MG/ML SOLN COMPARISON:  Chest x-ray from earlier in the same day, CT from 07/12/2018 FINDINGS: Cardiovascular: Thoracic aorta demonstrates atherosclerotic calcifications without aneurysmal dilatation. No dissection is identified. No cardiac enlargement is seen. No significant coronary calcifications are  noted. Pulmonary artery was not timed for embolus evaluation although no central filling defect is seen. Mediastinum/Nodes: Thoracic inlet is within normal limits. No sizable hilar or mediastinal adenopathy is noted. Scattered small reactive lymph nodes are noted. Small sliding-type hiatal hernia is seen. Lungs/Pleura: Lungs are well aerated bilaterally. Some areas of scarring are noted in the right middle lobe and lateral aspect of the left lower lobe and lingula similar to that seen on a prior CT from 2020. new linear area of scarring is noted which corresponds to that seen on recent plain film. No acute infiltrate or sizable effusion is noted. No sizable parenchymal nodules are noted. Upper Abdomen: Visualized upper abdomen shows no acute abnormality. Musculoskeletal: Degenerative changes of the thoracic spine are noted. IMPRESSION: Areas of scarring in the bases bilaterally. One of which corresponds with the findings on recent plain film examination. No acute infiltrate is seen. Aortic Atherosclerosis (ICD10-I70.0). Electronically Signed   By: Inez Catalina M.D.   On: 03/10/2021 00:58    Assessment & Plan:   Problem List Items Addressed This Visit     Anxiety disorder    Lunesta prn - Dr Jenny Reichmann prescribed Richardson Landry died in 04-19-23. Counseling was offered       CRI (chronic renal insufficiency), stage 3 (moderate) (HCC)    Counseling was offered       Essential hypertension     Cont w/Atenolol, Diltiazem,  Maxzide      Grief reaction    Richardson Landry died in 19-Apr-2023. Discussed Counseling was offered       Insomnia    Worse Lunesta prn - Dr Jenny Reichmann prescribed Richardson Landry died in Apr 19, 2023. Counseling was offered       Memory loss    Stable         No orders of the defined types were placed in this encounter.     Follow-up: Return in about 3 months (around 07/02/2021) for a follow-up visit.  [name], MD

## 2021-04-07 DIAGNOSIS — Z20828 Contact with and (suspected) exposure to other viral communicable diseases: Secondary | ICD-10-CM | POA: Diagnosis not present

## 2021-04-07 DIAGNOSIS — Z1159 Encounter for screening for other viral diseases: Secondary | ICD-10-CM | POA: Diagnosis not present

## 2021-04-09 ENCOUNTER — Other Ambulatory Visit: Payer: Self-pay | Admitting: Internal Medicine

## 2021-04-09 DIAGNOSIS — M542 Cervicalgia: Secondary | ICD-10-CM | POA: Diagnosis not present

## 2021-04-11 DIAGNOSIS — M25562 Pain in left knee: Secondary | ICD-10-CM | POA: Diagnosis not present

## 2021-04-11 DIAGNOSIS — Z96652 Presence of left artificial knee joint: Secondary | ICD-10-CM | POA: Diagnosis not present

## 2021-04-14 DIAGNOSIS — Z1159 Encounter for screening for other viral diseases: Secondary | ICD-10-CM | POA: Diagnosis not present

## 2021-04-14 DIAGNOSIS — Z20828 Contact with and (suspected) exposure to other viral communicable diseases: Secondary | ICD-10-CM | POA: Diagnosis not present

## 2021-04-16 DIAGNOSIS — Z20828 Contact with and (suspected) exposure to other viral communicable diseases: Secondary | ICD-10-CM | POA: Diagnosis not present

## 2021-04-16 DIAGNOSIS — R262 Difficulty in walking, not elsewhere classified: Secondary | ICD-10-CM | POA: Diagnosis not present

## 2021-04-16 DIAGNOSIS — M25562 Pain in left knee: Secondary | ICD-10-CM | POA: Diagnosis not present

## 2021-04-16 DIAGNOSIS — M6281 Muscle weakness (generalized): Secondary | ICD-10-CM | POA: Diagnosis not present

## 2021-04-16 DIAGNOSIS — Z1159 Encounter for screening for other viral diseases: Secondary | ICD-10-CM | POA: Diagnosis not present

## 2021-04-21 DIAGNOSIS — Z20828 Contact with and (suspected) exposure to other viral communicable diseases: Secondary | ICD-10-CM | POA: Diagnosis not present

## 2021-04-21 DIAGNOSIS — Z1159 Encounter for screening for other viral diseases: Secondary | ICD-10-CM | POA: Diagnosis not present

## 2021-04-22 DIAGNOSIS — M6281 Muscle weakness (generalized): Secondary | ICD-10-CM | POA: Diagnosis not present

## 2021-04-22 DIAGNOSIS — R262 Difficulty in walking, not elsewhere classified: Secondary | ICD-10-CM | POA: Diagnosis not present

## 2021-04-22 DIAGNOSIS — M25562 Pain in left knee: Secondary | ICD-10-CM | POA: Diagnosis not present

## 2021-04-23 DIAGNOSIS — R262 Difficulty in walking, not elsewhere classified: Secondary | ICD-10-CM | POA: Diagnosis not present

## 2021-04-23 DIAGNOSIS — M25562 Pain in left knee: Secondary | ICD-10-CM | POA: Diagnosis not present

## 2021-04-23 DIAGNOSIS — M6281 Muscle weakness (generalized): Secondary | ICD-10-CM | POA: Diagnosis not present

## 2021-04-30 DIAGNOSIS — M25562 Pain in left knee: Secondary | ICD-10-CM | POA: Diagnosis not present

## 2021-04-30 DIAGNOSIS — R262 Difficulty in walking, not elsewhere classified: Secondary | ICD-10-CM | POA: Diagnosis not present

## 2021-04-30 DIAGNOSIS — M6281 Muscle weakness (generalized): Secondary | ICD-10-CM | POA: Diagnosis not present

## 2021-05-04 ENCOUNTER — Other Ambulatory Visit: Payer: Self-pay | Admitting: Internal Medicine

## 2021-05-07 ENCOUNTER — Telehealth: Payer: Self-pay | Admitting: Internal Medicine

## 2021-05-07 NOTE — Telephone Encounter (Signed)
1.Medication Requested: PARoxetine (PAXIL) 40 MG tablet   2. Pharmacy (Name, [place]): [id number] - [place], [address] [place]  Phone:  [phone] Fax:  [phone]   3. On Med List: yes  4. Last Visit with PCP: 12.01.22  5. Next visit date with PCP: 03.02.23   Agent: Please be advised that RX refills may take up to 3 business days. We ask that you follow-up with your pharmacy.

## 2021-05-07 NOTE — Telephone Encounter (Signed)
Paxil was already filled on 05/05/21 w/ 3 additional refills. Pt need to check pharmacy.Marland KitchenJohny Chess

## 2021-05-12 ENCOUNTER — Other Ambulatory Visit: Payer: Self-pay | Admitting: Internal Medicine

## 2021-05-28 ENCOUNTER — Telehealth: Payer: Self-pay | Admitting: Internal Medicine

## 2021-05-28 NOTE — Telephone Encounter (Signed)
1.Medication Requested: diazepam (VALIUM) 5 MG tablet  2. Pharmacy (Name, [address], [place]): [facility], [place]  3. On Med List: yes  4. Last Visit with PCP: 03-17-2021  5. Next visit date with PCP: 07-03-2021   Agent: Please be advised that RX refills may take up to 3 business days. We ask that you follow-up with your pharmacy.

## 2021-05-29 MED ORDER — DIAZEPAM 5 MG PO TABS: 5.0000 mg | ORAL_TABLET | Freq: Every day | ORAL | 3 refills | Status: DC | PRN

## 2021-05-29 NOTE — Telephone Encounter (Signed)
Okay.  Thanks.

## 2021-06-21 ENCOUNTER — Other Ambulatory Visit: Payer: Self-pay | Admitting: Internal Medicine

## 2021-07-03 ENCOUNTER — Ambulatory Visit: Payer: Medicare Other | Admitting: Internal Medicine

## 2021-07-06 ENCOUNTER — Other Ambulatory Visit: Payer: Self-pay | Admitting: Internal Medicine

## 2021-07-17 ENCOUNTER — Encounter: Payer: Self-pay | Admitting: Internal Medicine

## 2021-07-17 ENCOUNTER — Other Ambulatory Visit: Payer: Self-pay

## 2021-07-17 ENCOUNTER — Ambulatory Visit (INDEPENDENT_AMBULATORY_CARE_PROVIDER_SITE_OTHER): Payer: Medicare Other | Admitting: Internal Medicine

## 2021-07-17 DIAGNOSIS — F4321 Adjustment disorder with depressed mood: Secondary | ICD-10-CM | POA: Diagnosis not present

## 2021-07-17 DIAGNOSIS — R413 Other amnesia: Secondary | ICD-10-CM | POA: Diagnosis not present

## 2021-07-17 DIAGNOSIS — N183 Chronic kidney disease, stage 3 unspecified: Secondary | ICD-10-CM

## 2021-07-17 DIAGNOSIS — I10 Essential (primary) hypertension: Secondary | ICD-10-CM

## 2021-07-17 LAB — COMPREHENSIVE METABOLIC PANEL
ALT: 11 U/L (ref 0–35)
AST: 25 U/L (ref 0–37)
Albumin: 4.6 g/dL (ref 3.5–5.2)
Alkaline Phosphatase: 87 U/L (ref 39–117)
BUN: 16 mg/dL (ref 6–23)
CO2: 35 mEq/L — ABNORMAL HIGH (ref 19–32)
Calcium: 10 mg/dL (ref 8.4–10.5)
Chloride: 100 mEq/L (ref 96–112)
Creatinine, Ser: 0.83 mg/dL (ref 0.40–1.20)
GFR: 70.18 mL/min (ref >60.00)
Glucose, Bld: 88 mg/dL (ref 70–99)
Potassium: 4.5 mEq/L (ref 3.5–5.1)
Sodium: 139 mEq/L (ref 135–145)
Total Bilirubin: 0.4 mg/dL (ref 0.2–1.2)
Total Protein: 7.6 g/dL (ref 6.0–8.3)

## 2021-07-17 NOTE — Assessment & Plan Note (Signed)
Multifactorial  ?Improved ?

## 2021-07-17 NOTE — Assessment & Plan Note (Signed)
Stable. Cont w/Atenolol, Diltiazem,  Maxzide 

## 2021-07-17 NOTE — Assessment & Plan Note (Signed)
Bianca Haley moved from [employer] in 3/23. ?Coping ok ? ?

## 2021-07-17 NOTE — Assessment & Plan Note (Signed)
Will monitor GFR ?

## 2021-07-17 NOTE — Progress Notes (Signed)
? ?Subjective:  ?Patient ID: Bianca Haley, female    DOB: 1950-07-10  Age: 71 y.o. MRN: [id number] ? ?CC: No chief complaint on file. ? ? ?HPI ?Bianca Haley presents for grief, depression, LBP f/u ?[name] moved from [employer] in 3/23. ?Coping ok ? ?Outpatient Medications Prior to Visit  ?Medication Sig Dispense Refill  ? acetaminophen (TYLENOL) 650 MG CR tablet Take 1,300 mg by mouth every 8 (eight) hours as needed for pain.    ? albuterol (VENTOLIN HFA) 108 (90 Base) MCG/ACT inhaler INHALE 2 PUFFS INTO THE LUNGS EVERY 4 HOURS AS NEEDED FOR WHEEZING OR SHORTNESS OF BREATH (Patient taking differently: Inhale 2 puffs into the lungs every 4 (four) hours as needed for wheezing or shortness of breath.) 18 g 2  ? Ascorbic Acid (VITAMIN C PO) Take 1 tablet by mouth every morning.    ? atenolol (TENORMIN) 100 MG tablet Take 1 tablet (100 mg total) by mouth every morning. 90 tablet 3  ? Calcium Carb-Cholecalciferol (CALCIUM PLUS VITAMIN D3) 600-500 MG-UNIT CAPS Take 2 tablets by mouth daily.    ? Cholecalciferol (VITAMIN D) 2000 units tablet Take 2,000 Units by mouth at bedtime.     ? cyanocobalamin 1000 MCG tablet Take 1,000 mcg by mouth daily. Vitamin b12    ? cycloSPORINE (RESTASIS) 0.05 % ophthalmic emulsion Place 1 drop into both eyes 2 (two) times daily.    ? diazepam (VALIUM) 5 MG tablet Take 1 tablet (5 mg total) by mouth daily as needed for anxiety or muscle spasms. 30 tablet 3  ? diltiazem (CARDIZEM CD) 180 MG 24 hr capsule TAKE 1 CAPSULE(180 MG) BY MOUTH DAILY 90 capsule 3  ? eszopiclone (LUNESTA) 2 MG TABS tablet Take 1 tablet (2 mg total) by mouth at bedtime as needed for sleep. Take immediately before bedtime 30 tablet 0  ? fluticasone (FLONASE) 50 MCG/ACT nasal spray Place 2 sprays into both nostrils daily as needed for allergies or rhinitis.    ? guaiFENesin (MUCINEX) 600 MG 12 hr tablet Take 1 tablet (600 mg total) by mouth 2 (two) times daily. 60 tablet 2  ? Melatonin 10 MG SUBL Place 10 mg under the  tongue at bedtime.    ? mirabegron ER (MYRBETRIQ) 50 MG TB24 tablet Take 50 mg by mouth every morning.    ? Multiple Vitamin (MULTIVITAMIN WITH MINERALS) TABS tablet Take 1 tablet by mouth 2 (two) times daily.    ? OVER THE COUNTER MEDICATION Take 2 capsules by mouth in the morning. [facility]    ? OXYGEN Inhale 1 L into the lungs See admin instructions. Overnight    ? oxymorphone (OPANA) 10 MG tablet Take 1 tablet (10 mg total) by mouth daily as needed for pain. (Patient taking differently: Take 5 mg by mouth See admin instructions. Take 1/2 tablet (5 mg) by mouth every morning, take 1/2 tablet (5 mg) 2 hours later if still needed; may also take 1/2 tablet (5 mg) in the afternoon as needed for pain) 1 tablet 0  ? pantoprazole (PROTONIX) 40 MG tablet TAKE 1 TABLET(40 MG) BY MOUTH TWICE DAILY BEFORE A MEAL 180 tablet 3  ? PARoxetine (PAXIL) 40 MG tablet TAKE 1 TABLET(40 MG) BY MOUTH TWICE DAILY 180 tablet 3  ? Polyethyl Glycol-Propyl Glycol (SYSTANE OP) Place 1 drop into both eyes 2 (two) times daily.    ? polyethylene glycol (MIRALAX / GLYCOLAX) packet Take 17 g by mouth 2 (two) times daily. (Patient taking differently: Take 17 g  by mouth daily as needed (constipation). Mix in 8 oz liquid and drink) 14 each 0  ? potassium chloride (KLOR-CON) 10 MEQ tablet TAKE 1 TABLET BY MOUTH DAILY 90 tablet 1  ? Probiotic CAPS Take 1 capsule by mouth every morning.    ? rosuvastatin (CRESTOR) 5 MG tablet TAKE 1 TABLET(5 MG) BY MOUTH DAILY 90 tablet 3  ? SUMAtriptan (IMITREX) 100 MG tablet TAKE 1 TABLET BY MOUTH DAILY AS NEEDED FOR MIGRAINE. MAY REPEAT IN 2 HOURS IF HEADACHE PERSISTS OR RECURS (Patient taking differently: Take 100 mg by mouth See admin instructions. Take one tablet (100 mg) by mouth at onset of migraine headache, may repeat in 2 hours if headache persists or recurs) 12 tablet 5  ? traZODone (DESYREL) 150 MG tablet TAKE 1 TABLET BY MOUTH AT BEDTIME AS NEEDED FOR SLEEP (Patient taking differently: Take 150 mg by  mouth at bedtime.) 30 tablet 11  ? valACYclovir (VALTREX) 500 MG tablet Take 500 mg by mouth See admin instructions. Take one tablet (500 mg) by mouth twice daily for 3-5 days as needed for outbreaks    ? gabapentin (NEURONTIN) 300 MG capsule Take 4 capsules (1,200 mg total) by mouth at bedtime. 120 capsule 0  ? ?No facility-administered medications prior to visit.  ? ? ?ROS: ?Review of Systems  ?Constitutional:  Negative for activity change, appetite change, chills, fatigue and unexpected weight change.  ?HENT:  Negative for congestion, mouth sores and sinus pressure.   ?Eyes:  Negative for visual disturbance.  ?Respiratory:  Negative for cough and chest tightness.   ?Gastrointestinal:  Negative for abdominal pain and nausea.  ?Genitourinary:  Negative for difficulty urinating, frequency and vaginal pain.  ?Musculoskeletal:  Positive for arthralgias, back pain and gait problem.  ?Skin:  Negative for pallor and rash.  ?Neurological:  Negative for dizziness, tremors, weakness, numbness and headaches.  ?Psychiatric/Behavioral:  Positive for decreased concentration. Negative for confusion, sleep disturbance and suicidal ideas. The patient is not nervous/anxious.   ? ?Objective:  ?BP 122/70 (BP Location: Left Arm, Patient Position: Sitting, Cuff Size: Large)   Pulse (!) 55   Temp 98.6 ?F (37 ?C) (Oral)   Ht '5\' 5"'$  (1.651 m)   Wt 142 lb (64.4 kg)   SpO2 90%   BMI 23.63 kg/m?  ? ?BP Readings from Last 3 Encounters:  ?07/17/21 122/70  ?04/03/21 130/72  ?03/17/21 112/60  ? ? ?Wt Readings from Last 3 Encounters:  ?07/17/21 142 lb (64.4 kg)  ?04/03/21 147 lb 6.4 oz (66.9 kg)  ?03/17/21 143 lb (64.9 kg)  ? ? ?Physical Exam ?Constitutional:   ?   General: She is not in acute distress. ?   Appearance: She is well-developed.  ?HENT:  ?   Head: Normocephalic.  ?   Right Ear: External ear normal.  ?   Left Ear: External ear normal.  ?   Nose: Nose normal.  ?Eyes:  ?   General:     ?   Right eye: No discharge.     ?   Left eye:  No discharge.  ?   Conjunctiva/sclera: Conjunctivae normal.  ?   Pupils: Pupils are equal, round, and reactive to light.  ?Neck:  ?   Thyroid: No thyromegaly.  ?   Vascular: No JVD.  ?   Trachea: No tracheal deviation.  ?Cardiovascular:  ?   Rate and Rhythm: Normal rate and regular rhythm.  ?   Heart sounds: Normal heart sounds.  ?Pulmonary:  ?  Effort: No respiratory distress.  ?   Breath sounds: No stridor. No wheezing.  ?Abdominal:  ?   General: Bowel sounds are normal. There is no distension.  ?   Palpations: Abdomen is soft. There is no mass.  ?   Tenderness: There is no abdominal tenderness. There is no guarding or rebound.  ?Musculoskeletal:     ?   General: Tenderness present.  ?   Cervical back: Normal range of motion and neck supple. No rigidity.  ?Lymphadenopathy:  ?   Cervical: No cervical adenopathy.  ?Skin: ?   Findings: No erythema or rash.  ?Neurological:  ?   Mental Status: She is oriented to person, place, and time.  ?   Cranial Nerves: No cranial nerve deficit.  ?   Motor: No abnormal muscle tone.  ?   Coordination: Coordination abnormal.  ?   Gait: Gait abnormal.  ?   Deep Tendon Reflexes: Reflexes normal.  ?Psychiatric:     ?   Behavior: Behavior normal.     ?   Thought Content: Thought content normal.     ?   Judgment: Judgment normal.  ? ? ?Lab Results  ?Component Value Date  ? WBC 8.0 03/10/2021  ? HGB 14.5 03/10/2021  ? HCT 42.7 03/10/2021  ? PLT 233 03/10/2021  ? GLUCOSE 88 07/17/2021  ? CHOL 155 04/03/2020  ? TRIG 102.0 04/03/2020  ? HDL 61.40 04/03/2020  ? [place] 73 04/03/2020  ? ALT 11 07/17/2021  ? AST 25 07/17/2021  ? NA 139 07/17/2021  ? K 4.5 07/17/2021  ? CL 100 07/17/2021  ? CREATININE 0.83 07/17/2021  ? BUN 16 07/17/2021  ? CO2 35 (H) 07/17/2021  ? TSH 0.213 (L) 03/11/2021  ? INR 1.0 09/21/2018  ? HGBA1C 6.1 06/23/2018  ? ? ?DG Chest 2 View ? ?Result Date: 03/09/2021 ?CLINICAL DATA:  Shortness of breath and cough for 4 days. EXAM: CHEST - 2 VIEW COMPARISON:  07/25/2020 FINDINGS:  Heart size is within normal limits. Mild pulmonary hyperinflation is again seen. New band like opacity is seen in the medial left lower lobe, which may be due to atelectasis or infiltrate. Right lung remains

## 2021-09-02 ENCOUNTER — Ambulatory Visit (INDEPENDENT_AMBULATORY_CARE_PROVIDER_SITE_OTHER): Payer: Medicare Other | Admitting: Internal Medicine

## 2021-09-02 ENCOUNTER — Other Ambulatory Visit: Payer: Self-pay

## 2021-09-02 ENCOUNTER — Encounter (HOSPITAL_COMMUNITY): Payer: Self-pay

## 2021-09-02 ENCOUNTER — Inpatient Hospital Stay (HOSPITAL_COMMUNITY)
Admission: EM | Admit: 2021-09-02 | Discharge: 2021-09-06 | DRG: 287 | Disposition: A | Discharge status: Home Health | Payer: Medicare Other | Attending: Internal Medicine | Admitting: Internal Medicine

## 2021-09-02 ENCOUNTER — Encounter: Payer: Self-pay | Admitting: Internal Medicine

## 2021-09-02 ENCOUNTER — Emergency Department (HOSPITAL_COMMUNITY): Payer: Medicare Other

## 2021-09-02 VITALS — BP 132/68 | HR 129 | Temp 98.0°F | Ht 65.0 in | Wt 137.0 lb

## 2021-09-02 DIAGNOSIS — J40 Bronchitis, not specified as acute or chronic: Secondary | ICD-10-CM | POA: Diagnosis present

## 2021-09-02 DIAGNOSIS — R9431 Abnormal electrocardiogram [ECG] [EKG]: Secondary | ICD-10-CM

## 2021-09-02 DIAGNOSIS — E876 Hypokalemia: Secondary | ICD-10-CM

## 2021-09-02 DIAGNOSIS — R778 Other specified abnormalities of plasma proteins: Secondary | ICD-10-CM | POA: Diagnosis present

## 2021-09-02 DIAGNOSIS — J9601 Acute respiratory failure with hypoxia: Principal | ICD-10-CM

## 2021-09-02 DIAGNOSIS — T17928A Food in respiratory tract, part unspecified causing other injury, initial encounter: Secondary | ICD-10-CM | POA: Diagnosis not present

## 2021-09-02 DIAGNOSIS — I480 Paroxysmal atrial fibrillation: Secondary | ICD-10-CM

## 2021-09-02 DIAGNOSIS — R0600 Dyspnea, unspecified: Secondary | ICD-10-CM | POA: Diagnosis present

## 2021-09-02 DIAGNOSIS — K219 Gastro-esophageal reflux disease without esophagitis: Secondary | ICD-10-CM | POA: Diagnosis present

## 2021-09-02 DIAGNOSIS — F419 Anxiety disorder, unspecified: Secondary | ICD-10-CM | POA: Diagnosis present

## 2021-09-02 DIAGNOSIS — Z90710 Acquired absence of both cervix and uterus: Secondary | ICD-10-CM

## 2021-09-02 DIAGNOSIS — I10 Essential (primary) hypertension: Secondary | ICD-10-CM | POA: Diagnosis present

## 2021-09-02 DIAGNOSIS — Z96652 Presence of left artificial knee joint: Secondary | ICD-10-CM | POA: Diagnosis present

## 2021-09-02 DIAGNOSIS — G43909 Migraine, unspecified, not intractable, without status migrainosus: Secondary | ICD-10-CM | POA: Diagnosis present

## 2021-09-02 DIAGNOSIS — E559 Vitamin D deficiency, unspecified: Secondary | ICD-10-CM | POA: Diagnosis present

## 2021-09-02 DIAGNOSIS — N179 Acute kidney failure, unspecified: Secondary | ICD-10-CM | POA: Diagnosis not present

## 2021-09-02 DIAGNOSIS — Z885 Allergy status to narcotic agent status: Secondary | ICD-10-CM

## 2021-09-02 DIAGNOSIS — F1120 Opioid dependence, uncomplicated: Secondary | ICD-10-CM | POA: Diagnosis present

## 2021-09-02 DIAGNOSIS — J069 Acute upper respiratory infection, unspecified: Secondary | ICD-10-CM | POA: Diagnosis present

## 2021-09-02 DIAGNOSIS — R002 Palpitations: Secondary | ICD-10-CM | POA: Diagnosis not present

## 2021-09-02 DIAGNOSIS — Z881 Allergy status to other antibiotic agents status: Secondary | ICD-10-CM

## 2021-09-02 DIAGNOSIS — E785 Hyperlipidemia, unspecified: Secondary | ICD-10-CM | POA: Diagnosis not present

## 2021-09-02 DIAGNOSIS — M720 Palmar fascial fibromatosis [Dupuytren]: Secondary | ICD-10-CM | POA: Diagnosis present

## 2021-09-02 DIAGNOSIS — J189 Pneumonia, unspecified organism: Secondary | ICD-10-CM

## 2021-09-02 DIAGNOSIS — I083 Combined rheumatic disorders of mitral, aortic and tricuspid valves: Secondary | ICD-10-CM | POA: Diagnosis present

## 2021-09-02 DIAGNOSIS — I214 Non-ST elevation (NSTEMI) myocardial infarction: Secondary | ICD-10-CM | POA: Diagnosis not present

## 2021-09-02 DIAGNOSIS — Z79899 Other long term (current) drug therapy: Secondary | ICD-10-CM

## 2021-09-02 DIAGNOSIS — H9190 Unspecified hearing loss, unspecified ear: Secondary | ICD-10-CM | POA: Diagnosis present

## 2021-09-02 DIAGNOSIS — R000 Tachycardia, unspecified: Secondary | ICD-10-CM

## 2021-09-02 DIAGNOSIS — F39 Unspecified mood [affective] disorder: Secondary | ICD-10-CM | POA: Diagnosis present

## 2021-09-02 DIAGNOSIS — Z86010 Personal history of colonic polyps: Secondary | ICD-10-CM

## 2021-09-02 DIAGNOSIS — J029 Acute pharyngitis, unspecified: Secondary | ICD-10-CM

## 2021-09-02 DIAGNOSIS — G894 Chronic pain syndrome: Secondary | ICD-10-CM | POA: Diagnosis present

## 2021-09-02 DIAGNOSIS — Z8701 Personal history of pneumonia (recurrent): Secondary | ICD-10-CM

## 2021-09-02 DIAGNOSIS — Y92230 Patient room in hospital as the place of occurrence of the external cause: Secondary | ICD-10-CM | POA: Diagnosis not present

## 2021-09-02 DIAGNOSIS — Z8249 Family history of ischemic heart disease and other diseases of the circulatory system: Secondary | ICD-10-CM

## 2021-09-02 DIAGNOSIS — X58XXXA Exposure to other specified factors, initial encounter: Secondary | ICD-10-CM | POA: Diagnosis not present

## 2021-09-02 DIAGNOSIS — Z9981 Dependence on supplemental oxygen: Secondary | ICD-10-CM

## 2021-09-02 DIAGNOSIS — M545 Low back pain, unspecified: Secondary | ICD-10-CM | POA: Diagnosis present

## 2021-09-02 DIAGNOSIS — J302 Other seasonal allergic rhinitis: Secondary | ICD-10-CM | POA: Diagnosis present

## 2021-09-02 DIAGNOSIS — Z87440 Personal history of urinary (tract) infections: Secondary | ICD-10-CM

## 2021-09-02 DIAGNOSIS — Z880 Allergy status to penicillin: Secondary | ICD-10-CM

## 2021-09-02 DIAGNOSIS — Z20822 Contact with and (suspected) exposure to covid-19: Secondary | ICD-10-CM | POA: Diagnosis present

## 2021-09-02 DIAGNOSIS — M1990 Unspecified osteoarthritis, unspecified site: Secondary | ICD-10-CM | POA: Diagnosis present

## 2021-09-02 DIAGNOSIS — E860 Dehydration: Secondary | ICD-10-CM | POA: Diagnosis present

## 2021-09-02 DIAGNOSIS — E0590 Thyrotoxicosis, unspecified without thyrotoxic crisis or storm: Secondary | ICD-10-CM | POA: Diagnosis present

## 2021-09-02 DIAGNOSIS — G4733 Obstructive sleep apnea (adult) (pediatric): Secondary | ICD-10-CM | POA: Diagnosis present

## 2021-09-02 HISTORY — DX: Obstructive sleep apnea (adult) (pediatric): G47.33

## 2021-09-02 LAB — COMPREHENSIVE METABOLIC PANEL
ALT: 30 U/L (ref 0–44)
AST: 62 U/L — ABNORMAL HIGH (ref 15–41)
Albumin: 3.1 g/dL — ABNORMAL LOW (ref 3.5–5.0)
Alkaline Phosphatase: 83 U/L (ref 38–126)
Anion gap: 18 — ABNORMAL HIGH (ref 5–15)
BUN: 30 mg/dL — ABNORMAL HIGH (ref 8–23)
CO2: 22 mmol/L (ref 22–32)
Calcium: 9.3 mg/dL (ref 8.9–10.3)
Chloride: 98 mmol/L (ref 98–111)
Creatinine, Ser: 1.42 mg/dL — ABNORMAL HIGH (ref 0.44–1.00)
GFR, Estimated: 39 mL/min — ABNORMAL LOW (ref >60)
Glucose, Bld: 132 mg/dL — ABNORMAL HIGH (ref 70–99)
Potassium: 3 mmol/L — ABNORMAL LOW (ref 3.5–5.1)
Sodium: 138 mmol/L (ref 135–145)
Total Bilirubin: 1.2 mg/dL (ref 0.3–1.2)
Total Protein: 7.4 g/dL (ref 6.5–8.1)

## 2021-09-02 LAB — RESPIRATORY PANEL BY PCR

## 2021-09-02 LAB — CBC WITH DIFFERENTIAL/PLATELET
Abs Immature Granulocytes: 0.21 10*3/uL — ABNORMAL HIGH (ref 0.00–0.07)
Basophils Absolute: 0.1 10*3/uL (ref 0.0–0.1)
Basophils Relative: 0 %
Eosinophils Absolute: 0 10*3/uL (ref 0.0–0.5)
Eosinophils Relative: 0 %
HCT: 43.2 % (ref 36.0–46.0)
Hemoglobin: 14.5 g/dL (ref 12.0–15.0)
Immature Granulocytes: 2 %
Lymphocytes Relative: 7 %
Lymphs Abs: 0.8 10*3/uL (ref 0.7–4.0)
MCH: 28.9 pg (ref 26.0–34.0)
MCHC: 33.6 g/dL (ref 30.0–36.0)
MCV: 86.1 fL (ref 80.0–100.0)
Monocytes Absolute: 1 10*3/uL (ref 0.1–1.0)
Monocytes Relative: 9 %
Neutro Abs: 9.5 10*3/uL — ABNORMAL HIGH (ref 1.7–7.7)
Neutrophils Relative %: 82 %
Platelets: 321 10*3/uL (ref 150–400)
RBC: 5.02 MIL/uL (ref 3.87–5.11)
RDW: 13.1 % (ref 11.5–15.5)
WBC: 11.6 10*3/uL — ABNORMAL HIGH (ref 4.0–10.5)
nRBC: 0 % (ref 0.0–0.2)

## 2021-09-02 LAB — PROCALCITONIN: Procalcitonin: 0.38 ng/mL

## 2021-09-02 LAB — TROPONIN I (HIGH SENSITIVITY)
Troponin I (High Sensitivity): 173 ng/L (ref <18)
Troponin I (High Sensitivity): 186 ng/L (ref <18)

## 2021-09-02 LAB — RESP PANEL BY RT-PCR (FLU A&B, COVID) ARPGX2
Influenza A by PCR: NEGATIVE
Influenza B by PCR: NEGATIVE
SARS Coronavirus 2 by RT PCR: NEGATIVE

## 2021-09-02 LAB — MAGNESIUM: Magnesium: 2.2 mg/dL (ref 1.7–2.4)

## 2021-09-02 LAB — POCT RAPID STREP A (OFFICE): Rapid Strep A Screen: NEGATIVE

## 2021-09-02 LAB — POC COVID19 BINAXNOW: SARS Coronavirus 2 Ag: NEGATIVE

## 2021-09-02 MED ORDER — PANTOPRAZOLE SODIUM 40 MG PO TBEC
40.0000 mg | DELAYED_RELEASE_TABLET | Freq: Two times a day (BID) | ORAL | Status: DC
Start: 2021-09-03 — End: 2021-09-06
  Administered 2021-09-03 – 2021-09-06 (×7): 40 mg via ORAL
  Filled 2021-09-02 (×7): qty 1

## 2021-09-02 MED ORDER — OXYCODONE HCL 5 MG PO TABS
10.0000 mg | ORAL_TABLET | Freq: Three times a day (TID) | ORAL | Status: DC
  Filled 2021-09-02: qty 2

## 2021-09-02 MED ORDER — GUAIFENESIN-CODEINE 100-10 MG/5ML PO SOLN
10.0000 mL | ORAL | Status: DC | PRN
  Administered 2021-09-02 – 2021-09-06 (×5): 10 mL via ORAL
  Filled 2021-09-02 (×7): qty 10

## 2021-09-02 MED ORDER — CYCLOSPORINE 0.05 % OP EMUL
1.0000 [drp] | Freq: Every morning | OPHTHALMIC | Status: DC
  Administered 2021-09-03 – 2021-09-05 (×2): 1 [drp] via OPHTHALMIC
  Filled 2021-09-02 (×4): qty 30

## 2021-09-02 MED ORDER — METOPROLOL TARTRATE 50 MG PO TABS
50.0000 mg | ORAL_TABLET | Freq: Two times a day (BID) | ORAL | Status: DC
  Administered 2021-09-02 – 2021-09-06 (×8): 50 mg via ORAL
  Filled 2021-09-02 (×4): qty 1
  Filled 2021-09-02 (×2): qty 2
  Filled 2021-09-02 (×2): qty 1

## 2021-09-02 MED ORDER — ENOXAPARIN SODIUM 40 MG/0.4ML IJ SOSY: 40.0000 mg | PREFILLED_SYRINGE | INTRAMUSCULAR | Status: DC

## 2021-09-02 MED ORDER — HYDRALAZINE HCL 20 MG/ML IJ SOLN
5.0000 mg | INTRAMUSCULAR | Status: DC | PRN
  Administered 2021-09-04: 5 mg via INTRAVENOUS
  Filled 2021-09-02: qty 1

## 2021-09-02 MED ORDER — ACETAMINOPHEN 650 MG RE SUPP: 650.0000 mg | Freq: Four times a day (QID) | RECTAL | Status: DC | PRN

## 2021-09-02 MED ORDER — MAGNESIUM SULFATE 2 GM/50ML IV SOLN
2.0000 g | Freq: Once | INTRAVENOUS | Status: AC
  Administered 2021-09-02: 2 g via INTRAVENOUS
  Filled 2021-09-02: qty 50

## 2021-09-02 MED ORDER — ATENOLOL 25 MG PO TABS: 100.0000 mg | ORAL_TABLET | Freq: Every morning | ORAL | Status: DC

## 2021-09-02 MED ORDER — DIAZEPAM 5 MG PO TABS
5.0000 mg | ORAL_TABLET | Freq: Every day | ORAL | Status: DC | PRN
  Administered 2021-09-03 – 2021-09-05 (×2): 5 mg via ORAL
  Filled 2021-09-02 (×2): qty 1

## 2021-09-02 MED ORDER — ACETAMINOPHEN 325 MG PO TABS
650.0000 mg | ORAL_TABLET | Freq: Four times a day (QID) | ORAL | Status: DC | PRN
  Administered 2021-09-03: 650 mg via ORAL
  Filled 2021-09-02: qty 2

## 2021-09-02 MED ORDER — POTASSIUM CHLORIDE 10 MEQ/100ML IV SOLN
10.0000 meq | Freq: Once | INTRAVENOUS | Status: AC
  Administered 2021-09-02: 10 meq via INTRAVENOUS
  Filled 2021-09-02: qty 100

## 2021-09-02 MED ORDER — GUAIFENESIN ER 600 MG PO TB12
600.0000 mg | ORAL_TABLET | Freq: Two times a day (BID) | ORAL | Status: DC
  Administered 2021-09-02 – 2021-09-06 (×8): 600 mg via ORAL
  Filled 2021-09-02 (×8): qty 1

## 2021-09-02 MED ORDER — LACTATED RINGERS IV BOLUS
1000.0000 mL | Freq: Once | INTRAVENOUS | Status: AC
Start: 2021-09-02 — End: 2021-09-02
  Administered 2021-09-02: 1000 mL via INTRAVENOUS

## 2021-09-02 MED ORDER — DILTIAZEM HCL ER COATED BEADS 180 MG PO CP24
180.0000 mg | ORAL_CAPSULE | Freq: Every morning | ORAL | Status: DC
Start: 2021-09-03 — End: 2021-09-02

## 2021-09-02 MED ORDER — POLYETHYL GLYCOL-PROPYL GLYCOL 0.4-0.3 % OP GEL: 1.0000 "application " | Freq: Every day | OPHTHALMIC | Status: DC

## 2021-09-02 MED ORDER — SODIUM CHLORIDE 0.9% FLUSH
3.0000 mL | Freq: Two times a day (BID) | INTRAVENOUS | Status: DC
  Administered 2021-09-02 – 2021-09-06 (×4): 3 mL via INTRAVENOUS

## 2021-09-02 MED ORDER — TRAZODONE HCL 100 MG PO TABS
150.0000 mg | ORAL_TABLET | Freq: Every day | ORAL | Status: DC
  Administered 2021-09-02 – 2021-09-05 (×4): 150 mg via ORAL
  Filled 2021-09-02: qty 3
  Filled 2021-09-02 (×3): qty 1

## 2021-09-02 MED ORDER — GABAPENTIN 400 MG PO CAPS
1200.0000 mg | ORAL_CAPSULE | Freq: Every day | ORAL | Status: DC
Start: 2021-09-02 — End: 2021-09-06
  Administered 2021-09-02 – 2021-09-05 (×3): 1200 mg via ORAL
  Filled 2021-09-02 (×2): qty 3
  Filled 2021-09-02: qty 4
  Filled 2021-09-02: qty 3

## 2021-09-02 MED ORDER — DILTIAZEM HCL ER COATED BEADS 240 MG PO CP24
240.0000 mg | ORAL_CAPSULE | Freq: Every morning | ORAL | Status: DC
Start: 2021-09-03 — End: 2021-09-06
  Administered 2021-09-03 – 2021-09-06 (×4): 240 mg via ORAL
  Filled 2021-09-02 (×4): qty 1

## 2021-09-02 MED ORDER — ASPIRIN EC 81 MG PO TBEC
81.0000 mg | DELAYED_RELEASE_TABLET | Freq: Every day | ORAL | Status: DC
  Administered 2021-09-02 – 2021-09-03 (×2): 81 mg via ORAL
  Filled 2021-09-02 (×4): qty 1

## 2021-09-02 MED ORDER — HEPARIN BOLUS VIA INFUSION
3500.0000 [IU] | Freq: Once | INTRAVENOUS | Status: AC
  Administered 2021-09-02: 3500 [IU] via INTRAVENOUS
  Filled 2021-09-02: qty 3500

## 2021-09-02 MED ORDER — IPRATROPIUM-ALBUTEROL 0.5-2.5 (3) MG/3ML IN SOLN
3.0000 mL | Freq: Once | RESPIRATORY_TRACT | Status: AC
  Administered 2021-09-02: 3 mL via RESPIRATORY_TRACT
  Filled 2021-09-02: qty 3

## 2021-09-02 MED ORDER — SODIUM CHLORIDE 0.9 % IV SOLN
1.0000 g | Freq: Once | INTRAVENOUS | Status: AC
  Administered 2021-09-02: 1 g via INTRAVENOUS
  Filled 2021-09-02: qty 10

## 2021-09-02 MED ORDER — OXYCODONE HCL 5 MG PO TABS
10.0000 mg | ORAL_TABLET | Freq: Three times a day (TID) | ORAL | Status: DC
  Administered 2021-09-02 – 2021-09-06 (×10): 10 mg via ORAL
  Filled 2021-09-02 (×10): qty 2

## 2021-09-02 MED ORDER — HEPARIN (PORCINE) 25000 UT/250ML-% IV SOLN
850.0000 [IU]/h | INTRAVENOUS | Status: DC
  Administered 2021-09-02: 900 [IU]/h via INTRAVENOUS
  Administered 2021-09-03: 750 [IU]/h via INTRAVENOUS
  Filled 2021-09-02 (×2): qty 250

## 2021-09-02 MED ORDER — POTASSIUM CHLORIDE CRYS ER 20 MEQ PO TBCR
40.0000 meq | EXTENDED_RELEASE_TABLET | Freq: Once | ORAL | Status: AC
Start: 2021-09-02 — End: 2021-09-02
  Administered 2021-09-02: 40 meq via ORAL
  Filled 2021-09-02: qty 2

## 2021-09-02 MED ORDER — MELATONIN 5 MG PO TABS
10.0000 mg | ORAL_TABLET | Freq: Every day | ORAL | Status: DC
  Administered 2021-09-04 – 2021-09-05 (×2): 10 mg via ORAL
  Filled 2021-09-02 (×4): qty 2

## 2021-09-02 MED ORDER — SODIUM CHLORIDE 0.9 % IV SOLN
100.0000 mg | Freq: Two times a day (BID) | INTRAVENOUS | Status: DC
  Administered 2021-09-02 – 2021-09-04 (×4): 100 mg via INTRAVENOUS
  Filled 2021-09-02 (×5): qty 100

## 2021-09-02 MED ORDER — METOPROLOL TARTRATE 5 MG/5ML IV SOLN: 2.5000 mg | INTRAVENOUS | Status: DC | PRN

## 2021-09-02 MED ORDER — MELATONIN 10 MG SL SUBL: 10.0000 mg | SUBLINGUAL_TABLET | Freq: Every day | SUBLINGUAL | Status: DC

## 2021-09-02 MED ORDER — PAROXETINE HCL 20 MG PO TABS
40.0000 mg | ORAL_TABLET | Freq: Two times a day (BID) | ORAL | Status: DC
  Administered 2021-09-02 – 2021-09-06 (×7): 40 mg via ORAL
  Filled 2021-09-02 (×8): qty 2

## 2021-09-02 MED ORDER — PREDNISONE 20 MG PO TABS
20.0000 mg | ORAL_TABLET | Freq: Every day | ORAL | Status: DC
  Administered 2021-09-03 – 2021-09-06 (×4): 20 mg via ORAL
  Filled 2021-09-02 (×4): qty 1

## 2021-09-02 MED ORDER — ROSUVASTATIN CALCIUM 5 MG PO TABS
5.0000 mg | ORAL_TABLET | Freq: Every morning | ORAL | Status: DC
Start: 2021-09-03 — End: 2021-09-06
  Administered 2021-09-03 – 2021-09-06 (×4): 5 mg via ORAL
  Filled 2021-09-02 (×4): qty 1

## 2021-09-02 NOTE — ED Notes (Signed)
Placed on 2L [place]  

## 2021-09-02 NOTE — ED Notes (Signed)
2 unsuccessful IV attempts.

## 2021-09-02 NOTE — ED Notes (Signed)
Dr. Bridgett Larsson notified of critical trop os 173 ?

## 2021-09-02 NOTE — Consult Note (Addendum)
?Cardiology Consultation:  ? ?Patient ID: Bianca Haley ?MRN: [id number]; DOB: [birth year] ? ?Admit date: 09/02/2021 ?Date of Consult: 09/02/2021 ? ?PCP:  Plotnikov, [name], MD ?  ?[facility] Providers ?Cardiologist:  New ? ? ?Patient Profile:  ? ?Bianca Haley is a 71 y.o. female with a hx of hypertension, endometriosis, gastroesophageal reflux disease who is being seen 09/02/2021 for the evaluation of atrial fibrillation at the request of Karmen Bongo, MD. ? ?History of Present Illness:  ? ?Echocardiogram March 2020 showed normal LV function, grade 1 diastolic dysfunction, mild to moderate aortic insufficiency.  Patient typically does not have dyspnea on exertion, orthopnea, PND, pedal edema, exertional chest pain, palpitations or syncope.  Over the past week she complains of increased fatigue, productive cough and diaphoresis.  She denies dyspnea, chest pain or palpitations.  She was seen at her primary care physician's office and electrocardiogram showed atrial fibrillation with rapid ventricular response.  She was transferred to the emergency room and cardiology is asked to evaluate. ? ? ?Past Medical History:  ?Diagnosis Date  ? Anemia   ? history of anemia  ? Anxiety   ? Arthritis   ? Chronic low back pain   ? Chronic renal failure   ? patient denies   ? Diverticulosis   ? Dupuytren contracture   ? Right hand, has received injection  ? GERD (gastroesophageal reflux disease)   ? H/O tinnitus   ? left  ? Hearing loss   ? 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  ? Hemorrhoids   ? History of colon polyps   ? History of endometriosis   ? and fibroids  ? History of hiatal hernia   ? History of UTI   ? Hypertension   ? Memory loss   ? Migraine   ? OSA (obstructive sleep apnea)   ? on nocturnal O2  ? Recurrent pneumonia 07/2018  ? Seasonal allergies   ? Vitamin D deficiency   ? Wears glasses   ? ? ?Past Surgical History:  ?Procedure Laterality Date  ? ABDOMINAL HYSTERECTOMY    ? BACK SURGERY    ? BIOPSY  10/20/2018  ?  Procedure: BIOPSY;  Surgeon: [name], MD;  Location: [facility];  Service: Endoscopy;;  ? BLADDER SUSPENSION    ? COLONOSCOPY  02/16/2008  ? normal  ? DENTAL SURGERY    ? ESOPHAGOGASTRODUODENOSCOPY (EGD) WITH PROPOFOL N/A 10/20/2018  ? Procedure: ESOPHAGOGASTRODUODENOSCOPY (EGD) WITH PROPOFOL;  Surgeon: [name], MD;  Location: [facility];  Service: Endoscopy;  Laterality: N/A;  ? EUS N/A 10/20/2018  ? Procedure: UPPER ENDOSCOPIC ULTRASOUND (EUS) RADIAL;  Surgeon: [name], MD;  Location: [facility];  Service: Endoscopy;  Laterality: N/A;  ? FOOT SURGERY Left   ? x3  ? laparoscopy    ? for evaluation of endometriosis  ? TOTAL KNEE ARTHROPLASTY Left 07/15/2015  ? Procedure: TOTAL LEFT KNEE ARTHROPLASTY;  Surgeon: Paralee Cancel, MD;  Location: [facility];  Service: Orthopedics;  Laterality: Left;  ? UPPER GI ENDOSCOPY    ?  ? ?Inpatient Medications: ?Scheduled Meds: ? ?Continuous Infusions: ? doxycycline (VIBRAMYCIN) IV 100 mg (09/02/21 1553)  ? potassium chloride 10 mEq (09/02/21 1552)  ? ?PRN Meds: ? ? ?Allergies:    ?Allergies  ?Allergen Reactions  ? Penicillins Shortness Of Breath and Rash  ?  Did it involve swelling of the face/tongue/throat, SOB, or low BP? No ?Did it involve sudden or severe rash/hives, skin peeling, or any reaction on the inside  of your mouth or nose? Yes ?Did you need to seek medical attention at a hospital or doctor's office? No ?When did it last happen?      10 + years ?If all above answers are ?NO?, may proceed with cephalosporin use. ? ?  ? Morphine And Related Swelling  ?  Sedation/Swelling described like edema/bloating/doesn't help the pain ?Morphine only; tolerates other opioids.   ? Cefepime Rash  ?  Reaction while taking both cefepime and vancomycin at [facility] (after 3 days)  ? Vancomycin Rash  ?  Reaction while taking both cefepime and vancomycin at [facility] (after 3 days)  ? ? ?Social History:   ?Social History  ? ?Socioeconomic History  ? Marital status: Widowed  ?   Spouse name: Not on file  ? Number of children: 2  ? Years of education: 25  ? Highest education level: Not on file  ?Occupational History  ? Occupation: Futures trader - retired  ?Tobacco Use  ? Smoking status: Never  ? Smokeless tobacco: Never  ?Vaping Use  ? Vaping Use: Never used  ?Substance and Sexual Activity  ? Alcohol use: No  ?  Alcohol/week: 0.0 standard drinks  ?  Comment: As of 08-09-2018, it is been several years since she drank any alcohol.  ? Drug use: No  ? Sexual activity: Yes  ?  Partners: Male  ?  Birth control/protection: Surgical  ?Other Topics Concern  ? Not on file  ?Social History Narrative  ? [employer], [employer] BS interior design. Married 08/08/77 - 63 years, her husband died in 1997-08-08.  She has been with her current, second husband since around 08-Aug-2008.  1 dtr - 08/09/82 - lawyer, 1 son Aug 09, 1987. No h/o abuse.  ? ?Social Determinants of Health  ? ?Financial Resource Strain: Low Risk   ? Difficulty of Paying Living Expenses: Not hard at all  ?Food Insecurity: Not on file  ?Transportation Needs: Not on file  ?Physical Activity: Not on file  ?Stress: Not on file  ?Social Connections: Not on file  ?Intimate Partner Violence: Not on file  ?  ?Family History:   ? ?Family History  ?Problem Relation Age of Onset  ? Emphysema Mother   ? Heart disease Mother 38  ?     MI  ?  ? ?ROS:  ?Please see the history of present illness.  ?Fatigue, productive cough, diaphoresis. ?All other ROS reviewed and negative.    ? ?Physical Exam/Data:  ? ?Vitals:  ? 09/02/21 1450 09/02/21 1452 09/02/21 1500 09/02/21 1535  ?BP:   (!) 149/76 134/69  ?Pulse: 87 91 85 84  ?Resp: 20 (!) 26 (!) 23 (!) 24  ?Temp:      ?TempSrc:      ?SpO2: (!) 88% 95% 96% 96%  ?Weight:      ?Height:      ? ? ?Intake/Output Summary (Last 24 hours) at 09/02/2021 1607 ?Last data filed at 09/02/2021 1553 ?Gross per 24 hour  ?Intake 150 ml  ?Output --  ?Net 150 ml  ? ? ?  09/02/2021  ? 12:48 PM 09/02/2021  ? 10:59 AM 07/17/2021  ?  3:33 PM  ?Last 3 Weights  ?Weight (lbs) 137 lb 137 lb  142 lb  ?Weight (kg) 62.143 kg 62.143 kg 64.411 kg  ?   ?Body mass index is 22.8 kg/m?.  ?General:  Well nourished, well developed, in no acute distress ?HEENT: normal ?Neck: no JVD ?Vascular: No carotid bruits; Distal pulses 2+ bilaterally ?Cardiac:  normal S1,  S2; RRR; no murmur  ?Lungs: Diminished breath sounds throughout with expiratory wheeze and rhonchi. ?Abd: soft, nontender, no hepatomegaly  ?Ext: no edema ?Musculoskeletal:  No deformities, BUE and BLE strength normal and equal ?Skin: warm and dry  ?Neuro:  CNs 2-12 intact, no focal abnormalities noted ?Psych:  Normal affect  ? ?EKG:  The EKG was personally reviewed and demonstrates:   ?Normal sinus rhythm, left ventricular hypertrophy, anterior T wave inversion, prolonged QT interval. ? ?Initial electrocardiogram at primary care showed atrial fibrillation with rapid ventricular response; left ventricular hypertrophy, anterior ST elevation with rapid ventricular response. ? ?Laboratory Data: ?\ ?Chemistry ?Recent Labs  ?Lab 09/02/21 ?1302  ?NA 138  ?K 3.0*  ?CL 98  ?CO2 22  ?GLUCOSE 132*  ?BUN 30*  ?CREATININE 1.42*  ?CALCIUM 9.3  ?MG 2.2  ?GFRNONAA 39*  ?ANIONGAP 18*  ?  ?Recent Labs  ?Lab 09/02/21 ?1302  ?PROT 7.4  ?ALBUMIN 3.1*  ?AST 62*  ?ALT 30  ?ALKPHOS 83  ?BILITOT 1.2  ? ?Hematology ?Recent Labs  ?Lab 09/02/21 ?1302  ?WBC 11.6*  ?RBC 5.02  ?HGB 14.5  ?HCT 43.2  ?MCV 86.1  ?MCH 28.9  ?MCHC 33.6  ?RDW 13.1  ?PLT 321  ? ? ? ?Radiology/Studies:  ?DG Chest 2 View ? ?Result Date: 09/02/2021 ?CLINICAL DATA:  Cough. EXAM: CHEST - 2 VIEW COMPARISON:  March 09, 2021. FINDINGS: Stable cardiomediastinal silhouette. Both lungs are clear. The visualized skeletal structures are unremarkable. IMPRESSION: No active cardiopulmonary disease. Electronically Signed   By: Marijo Conception M.D.   On: 09/02/2021 14:42   ? ? ?Assessment and Plan:  ? ?Paroxysmal atrial fibrillation-patient was noted to be in atrial fibrillation at primary care's office.  Potentially driven by  recent URI.  Patient is back in sinus rhythm now.  We will increase home Cardizem dose from 180 to 240 mg daily.  Discontinue atenolol and instead treat with metoprolol 50 mg twice daily.  Schedule echocardi

## 2021-09-02 NOTE — ED Provider Notes (Signed)
?[facility] ?Provider Note ? ? ?CSN: [id number] ?Arrival date & time: 09/02/21  1245 ? ?  ? ?History ? ?Chief Complaint  ?Patient presents with  ? Palpitations  ? ? ?Bianca Haley is a 71 y.o. female. ? ?HPI ?71 year old female presents from PCP office with tachycardia.  EMS provides initial history, which shows that the patient had a heart rate in the 190s at the PCP office and then spontaneously converted when getting into the EMS truck.  Patient's been dealing with respiratory symptoms including sore throat and cough for about 1 week.  Has productive sputum.  No fevers and no chest pain.  She did note that something felt abnormal in her chest today but could not put a finger on it.  No dizziness.  No leg swelling.  No prior history of arrhythmia per her.  EMS noted that it appeared she was in A-fib or flutter. ? ?Home Medications ?Prior to Admission medications   ?Medication Sig Start Date End Date Taking? Authorizing Provider  ?acetaminophen (TYLENOL) 650 MG CR tablet Take 1,300 mg by mouth every 8 (eight) hours as needed for pain.    [provider]  ?albuterol (VENTOLIN HFA) 108 (90 Base) MCG/ACT inhaler INHALE 2 PUFFS INTO THE LUNGS EVERY 4 HOURS AS NEEDED FOR WHEEZING OR SHORTNESS OF BREATH ?Patient taking differently: Inhale 2 puffs into the lungs every 4 (four) hours as needed for wheezing or shortness of breath. 05/09/20   Parrett, Fonnie Mu, NP  ?Ascorbic Acid (VITAMIN C PO) Take 1 tablet by mouth every morning.    [provider]  ?atenolol (TENORMIN) 100 MG tablet Take 1 tablet (100 mg total) by mouth every morning. 05/12/21   Plotnikov, [name], MD  ?Calcium Carb-Cholecalciferol (CALCIUM PLUS VITAMIN D3) 600-500 MG-UNIT CAPS Take 2 tablets by mouth daily.    [provider]  ?Cholecalciferol (VITAMIN D) 2000 units tablet Take 2,000 Units by mouth at bedtime.     [provider]  ?cyanocobalamin 1000 MCG tablet Take 1,000 mcg by  mouth daily. Vitamin b12    [provider]  ?cycloSPORINE (RESTASIS) 0.05 % ophthalmic emulsion Place 1 drop into both eyes 2 (two) times daily.    [provider]  ?diazepam (VALIUM) 5 MG tablet Take 1 tablet (5 mg total) by mouth daily as needed for anxiety or muscle spasms. 05/29/21   Plotnikov, [name], MD  ?diltiazem (CARDIZEM CD) 180 MG 24 hr capsule TAKE 1 CAPSULE(180 MG) BY MOUTH DAILY 07/07/21   Plotnikov, [name], MD  ?eszopiclone (LUNESTA) 2 MG TABS tablet Take 1 tablet (2 mg total) by mouth at bedtime as needed for sleep. Take immediately before bedtime 03/17/21   Biagio Borg, MD  ?fluticasone [facility]) 50 MCG/ACT nasal spray Place 2 sprays into both nostrils daily as needed for allergies or rhinitis.    [provider]  ?gabapentin (NEURONTIN) 300 MG capsule Take 4 capsules (1,200 mg total) by mouth at bedtime. 03/12/21 04/11/21  Kathie Dike, MD  ?guaiFENesin (MUCINEX) 600 MG 12 hr tablet Take 1 tablet (600 mg total) by mouth 2 (two) times daily. 03/12/21 03/12/22  Kathie Dike, MD  ?Melatonin 10 MG SUBL Place 10 mg under the tongue at bedtime.    [provider]  ?mirabegron ER (MYRBETRIQ) 50 MG TB24 tablet Take 50 mg by mouth every morning.    [provider]  ?Multiple Vitamin (MULTIVITAMIN WITH MINERALS) TABS tablet Take 1 tablet by mouth 2 (two) times  daily.    [provider]  ?OVER THE COUNTER MEDICATION Take 2 capsules by mouth in the morning. [facility]    [provider]  ?OXYGEN Inhale 1 L into the lungs See admin instructions. Overnight    [provider]  ?oxymorphone (OPANA) 10 MG tablet Take 1 tablet (10 mg total) by mouth daily as needed for pain. ?Patient taking differently: Take 5 mg by mouth See admin instructions. Take 1/2 tablet (5 mg) by mouth every morning, take 1/2 tablet (5 mg) 2 hours later if still needed; may also take 1/2 tablet (5 mg) in the afternoon as needed for pain 07/27/20   Thurnell Lose, MD  ?pantoprazole (PROTONIX) 40 MG tablet TAKE 1 TABLET(40 MG) BY MOUTH TWICE DAILY BEFORE A MEAL 04/09/21   Plotnikov, [name], MD  ?PARoxetine (PAXIL) 40 MG tablet TAKE 1 TABLET(40 MG) BY MOUTH TWICE DAILY 05/05/21   Plotnikov, [name], MD  ?Polyethyl Glycol-Propyl Glycol (SYSTANE OP) Place 1 drop into both eyes 2 (two) times daily.    [provider]  ?polyethylene glycol (MIRALAX / GLYCOLAX) packet Take 17 g by mouth 2 (two) times daily. ?Patient taking differently: Take 17 g by mouth daily as needed (constipation). Mix in 8 oz liquid and drink 07/16/15   Danae Orleans, PA-C  ?potassium chloride (KLOR-CON) 10 MEQ tablet TAKE 1 TABLET BY MOUTH DAILY 04/09/21   Plotnikov, [name], MD  ?Probiotic CAPS Take 1 capsule by mouth every morning.    [provider]  ?rosuvastatin (CRESTOR) 5 MG tablet TAKE 1 TABLET(5 MG) BY MOUTH DAILY 06/23/21   Plotnikov, [name], MD  ?SUMAtriptan (IMITREX) 100 MG tablet TAKE 1 TABLET BY MOUTH DAILY AS NEEDED FOR MIGRAINE. MAY REPEAT IN 2 HOURS IF HEADACHE PERSISTS OR RECURS ?Patient taking differently: Take 100 mg by mouth See admin instructions. Take one tablet (100 mg) by mouth at onset of migraine headache, may repeat in 2 hours if headache persists or recurs 02/19/21   Plotnikov, [name], MD  ?traZODone (DESYREL) 150 MG tablet TAKE 1 TABLET BY MOUTH AT BEDTIME AS NEEDED FOR SLEEP ?Patient taking differently: Take 150 mg by mouth at bedtime. 10/21/20   Plotnikov, [name], MD  ?valACYclovir (VALTREX) 500 MG tablet Take 500 mg by mouth See admin instructions. Take one tablet (500 mg) by mouth twice daily for 3-5 days as needed for outbreaks 03/06/21   [provider]  ?   ? ?Allergies    ?Penicillins, Morphine and related, Cefepime, and Vancomycin   ? ?Review of Systems   ?Review of Systems  ?Constitutional:  Negative for fever.  ?Respiratory:  Positive for cough and shortness of breath.   ?Cardiovascular:  Positive for palpitations. Negative for  chest pain and leg swelling.  ? ?Physical Exam ?Updated Vital Signs ?BP (!) 149/76   Pulse 85   Temp 98.3 ?F (36.8 ?C) (Oral)   Resp (!) 23   Ht '5\' 5"'$  (1.651 m)   Wt 62.1 kg   SpO2 96%   BMI 22.80 kg/m?  ?Physical Exam ?Vitals and nursing note reviewed.  ?Constitutional:   ?   Appearance: She is well-developed. She is not ill-appearing or diaphoretic.  ?HENT:  ?   Head: Normocephalic and atraumatic.  ?Cardiovascular:  ?   Rate and Rhythm: Regular rhythm. Tachycardia present.  ?   Heart sounds: Normal heart sounds.  ?   Comments: HR low 100s ?Pulmonary:  ?   Effort: Pulmonary effort is normal.  ?  Breath sounds: Wheezing and rhonchi present.  ?Abdominal:  ?   Palpations: Abdomen is soft.  ?   Tenderness: There is no abdominal tenderness.  ?Musculoskeletal:  ?   Right lower leg: No edema.  ?   Left lower leg: No edema.  ?Skin: ?   General: Skin is warm and dry.  ?Neurological:  ?   Mental Status: She is alert.  ? ? ?ED Results / Procedures / Treatments   ?Labs ?(all labs ordered are listed, but only abnormal results are displayed) ?Labs Reviewed  ?COMPREHENSIVE METABOLIC PANEL - Abnormal; Notable for the following components:  ?    Result Value  ? Potassium 3.0 (*)   ? Glucose, Bld 132 (*)   ? BUN 30 (*)   ? Creatinine, Ser 1.42 (*)   ? Albumin 3.1 (*)   ? AST 62 (*)   ? GFR, Estimated 39 (*)   ? Anion gap 18 (*)   ? All other components within normal limits  ?CBC WITH DIFFERENTIAL/PLATELET - Abnormal; Notable for the following components:  ? WBC 11.6 (*)   ? Neutro Abs 9.5 (*)   ? Abs Immature Granulocytes 0.21 (*)   ? All other components within normal limits  ?RESP PANEL BY RT-PCR (FLU A&B, COVID) [id number]  ?CULTURE, BLOOD (ROUTINE X 2)  ?CULTURE, BLOOD (ROUTINE X 2)  ?MAGNESIUM  ? ? ?EKG ?EKG Interpretation ? ?Date/Time:  Tuesday Sep 02 2021 14:36:09 EDT ?Ventricular Rate:  89 ?PR Interval:  93 ?QRS Duration: 85 ?QT Interval:  412 ?QTC Calculation: 502 ?R Axis:   53 ?Text Interpretation: Sinus rhythm Short  PR interval Right atrial enlargement Left ventricular hypertrophy Abnormal T, probable ischemia, anterior leads Prolonged QT interval U waves? Confirmed by Sherwood Gambler [phone]) on 09/02/2021 2:44:23 PM ? ?[employer]

## 2021-09-02 NOTE — ED Triage Notes (Addendum)
BIB EMS after going to doctor for URI symptoms, weakness and mild sob x 8 days.  PCP sent patient here for rule out PNA and hx of resp failure. EKG at MD was >200 hr.  Patient converted self with ems to HR 100 ?

## 2021-09-02 NOTE — H&P (Signed)
?History and Physical  ? ? ?Patient: Bianca Haley HYI:[id number] DOB: October 14, 1950 ?DOA: 09/02/2021 ?DOS: the patient was seen and examined on 09/02/2021 ?PCP: Plotnikov, [name], MD  ?Patient coming from: Home - lives alone (husband died in Apr 01, 2023); NOK: Son or daughter, Bianca Haley, [phone] ? ? ?Chief Complaint: URI symptoms, palpitations ? ?HPI: Bianca Haley is a 71 y.o. female with medical history significant of chronic back pain and HTN who presented to her PCP today with URI symptoms and palpitations.  EKG in the ER showed HR 190s.    She went to her PCP today because "I just wasn't doing well enough."  She did not feel well enough to drive a car.  She called her sister to take her to the appointment.  She had URI symptoms for a week and day after day she wasn't getting better.  No fever.  +hoarseness, coarse cough.  Mild SOB, wears occasional home O2 mostly at night.  She felt very tired.  Her daughter came with her children the prior weekend and they were not obviously sick but they got sick the following Monday (8 days ago).  This AM, she noticed palpitations for the first time.  She was not more SOB today.  No chest pain.   ? ? ? ?ER Course:  Bronchitis, O2 87%.  URI symptoms x 1 week.  Went to PCP - appears to be afib with RVR up to almost 200.  Converted spontaneously.  Given Duoneb, IVF.  EKG with ?prolonged QTc.  K+ 3.0, repleting K+ and Mag++.  On 2L, given Rocephin, Doxycycline.  Will consult cardiology. ? ? ? ? ?Review of Systems: As mentioned in the history of present illness. All other systems reviewed and are negative. ?Past Medical History:  ?Diagnosis Date  ? Anemia   ? history of anemia  ? Anxiety   ? Arthritis   ? Chronic low back pain   ? Chronic renal failure   ? patient denies   ? Diverticulosis   ? Dupuytren contracture   ? Right hand, has received injection  ? GERD (gastroesophageal reflux disease)   ? H/O tinnitus   ? left  ? Hearing loss   ? 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  ?  Hemorrhoids   ? History of colon polyps   ? History of endometriosis   ? and fibroids  ? History of hiatal hernia   ? History of UTI   ? Hypertension   ? Memory loss   ? Migraine   ? OSA (obstructive sleep apnea)   ? on nocturnal O2  ? Recurrent pneumonia 07/2018  ? Seasonal allergies   ? Vitamin D deficiency   ? Wears glasses   ? ?Past Surgical History:  ?Procedure Laterality Date  ? ABDOMINAL HYSTERECTOMY    ? BACK SURGERY    ? BIOPSY  10/20/2018  ? Procedure: BIOPSY;  Surgeon: [name], MD;  Location: [facility];  Service: Endoscopy;;  ? BLADDER SUSPENSION    ? COLONOSCOPY  02/16/2008  ? normal  ? DENTAL SURGERY    ? ESOPHAGOGASTRODUODENOSCOPY (EGD) WITH PROPOFOL N/A 10/20/2018  ? Procedure: ESOPHAGOGASTRODUODENOSCOPY (EGD) WITH PROPOFOL;  Surgeon: [name], MD;  Location: [facility];  Service: Endoscopy;  Laterality: N/A;  ? EUS N/A 10/20/2018  ? Procedure: UPPER ENDOSCOPIC ULTRASOUND (EUS) RADIAL;  Surgeon: [name], MD;  Location: [facility];  Service: Endoscopy;  Laterality: N/A;  ? FOOT SURGERY Left   ? x3  ? laparoscopy    ?  for evaluation of endometriosis  ? TOTAL KNEE ARTHROPLASTY Left 07/15/2015  ? Procedure: TOTAL LEFT KNEE ARTHROPLASTY;  Surgeon: Paralee Cancel, MD;  Location: [facility];  Service: Orthopedics;  Laterality: Left;  ? UPPER GI ENDOSCOPY    ? ?Social History:  reports that she has never smoked. She has never used smokeless tobacco. She reports that she does not drink alcohol and does not use drugs. ? ?Allergies  ?Allergen Reactions  ? Penicillins Shortness Of Breath and Rash  ?  Did it involve swelling of the face/tongue/throat, SOB, or low BP? No ?Did it involve sudden or severe rash/hives, skin peeling, or any reaction on the inside of your mouth or nose? Yes ?Did you need to seek medical attention at a hospital or doctor's office? No ?When did it last happen?      10 + years ?If all above answers are ?NO?, may proceed with cephalosporin use. ? ?  ? Morphine And  Related Swelling  ?  Sedation/Swelling described like edema/bloating/doesn't help the pain ?Morphine only; tolerates other opioids.   ? Cefepime Rash  ?  Reaction while taking both cefepime and vancomycin at [facility] (after 3 days)  ? Vancomycin Rash  ?  Reaction while taking both cefepime and vancomycin at [facility] (after 3 days)  ? ? ?Family History  ?Problem Relation Age of Onset  ? Emphysema Mother   ? Heart disease Mother 67  ?     MI  ? ? ?Prior to Admission medications   ?Medication Sig Start Date End Date Taking? Authorizing Provider  ?acetaminophen (TYLENOL) 650 MG CR tablet Take 1,300 mg by mouth every 8 (eight) hours as needed for pain.    [provider]  ?albuterol (VENTOLIN HFA) 108 (90 Base) MCG/ACT inhaler INHALE 2 PUFFS INTO THE LUNGS EVERY 4 HOURS AS NEEDED FOR WHEEZING OR SHORTNESS OF BREATH ?Patient taking differently: Inhale 2 puffs into the lungs every 4 (four) hours as needed for wheezing or shortness of breath. 05/09/20   Parrett, Fonnie Mu, NP  ?Ascorbic Acid (VITAMIN C PO) Take 1 tablet by mouth every morning.    [provider]  ?atenolol (TENORMIN) 100 MG tablet Take 1 tablet (100 mg total) by mouth every morning. 05/12/21   Plotnikov, [name], MD  ?Calcium Carb-Cholecalciferol (CALCIUM PLUS VITAMIN D3) 600-500 MG-UNIT CAPS Take 2 tablets by mouth daily.    [provider]  ?Cholecalciferol (VITAMIN D) 2000 units tablet Take 2,000 Units by mouth at bedtime.     [provider]  ?cyanocobalamin 1000 MCG tablet Take 1,000 mcg by mouth daily. Vitamin b12    [provider]  ?cycloSPORINE (RESTASIS) 0.05 % ophthalmic emulsion Place 1 drop into both eyes 2 (two) times daily.    [provider]  ?diazepam (VALIUM) 5 MG tablet Take 1 tablet (5 mg total) by mouth daily as needed for anxiety or muscle spasms. 05/29/21   Plotnikov, [name], MD  ?diltiazem (CARDIZEM CD) 180 MG 24 hr capsule TAKE 1 CAPSULE(180 MG) BY MOUTH DAILY 07/07/21   Plotnikov, [name], MD  ?eszopiclone (LUNESTA) 2 MG TABS tablet Take 1 tablet (2 mg total) by mouth at bedtime as needed for sleep. Take immediately before bedtime 03/17/21   Biagio Borg, MD  ?fluticasone [facility]) 50 MCG/ACT nasal spray Place 2 sprays into both nostrils daily as needed for allergies or rhinitis.    [provider]  ?gabapentin (NEURONTIN) 300 MG capsule Take 4 capsules (1,200 mg total) by mouth at  bedtime. 03/12/21 04/11/21  Kathie Dike, MD  ?guaiFENesin (MUCINEX) 600 MG 12 hr tablet Take 1 tablet (600 mg total) by mouth 2 (two) times daily. 03/12/21 03/12/22  Kathie Dike, MD  ?Melatonin 10 MG SUBL Place 10 mg under the tongue at bedtime.    [provider]  ?mirabegron ER (MYRBETRIQ) 50 MG TB24 tablet Take 50 mg by mouth every morning.    [provider]  ?Multiple Vitamin (MULTIVITAMIN WITH MINERALS) TABS tablet Take 1 tablet by mouth 2 (two) times daily.    [provider]  ?OVER THE COUNTER MEDICATION Take 2 capsules by mouth in the morning. [facility]    [provider]  ?OXYGEN Inhale 1 L into the lungs See admin instructions. Overnight    [provider]  ?oxymorphone (OPANA) 10 MG tablet Take 1 tablet (10 mg total) by mouth daily as needed for pain. ?Patient taking differently: Take 5 mg by mouth See admin instructions. Take 1/2 tablet (5 mg) by mouth every morning, take 1/2 tablet (5 mg) 2 hours later if still needed; may also take 1/2 tablet (5 mg) in the afternoon as needed for pain 07/27/20   Thurnell Lose, MD  ?pantoprazole (PROTONIX) 40 MG tablet TAKE 1 TABLET(40 MG) BY MOUTH TWICE DAILY BEFORE A MEAL 04/09/21   Plotnikov, [name], MD  ?PARoxetine (PAXIL) 40 MG tablet TAKE 1 TABLET(40 MG) BY MOUTH TWICE DAILY 05/05/21   Plotnikov, [name], MD  ?Polyethyl Glycol-Propyl Glycol (SYSTANE OP) Place 1 drop into both eyes 2 (two) times daily.    [provider]  ?polyethylene glycol (MIRALAX / GLYCOLAX) packet Take 17 g by mouth 2 (two)  times daily. ?Patient taking differently: Take 17 g by mouth daily as needed (constipation). Mix in 8 oz liquid and drink 07/16/15   Danae Orleans, PA-C  ?potassium chloride (KLOR-CON) 10 MEQ tablet TAKE 1 TA

## 2021-09-02 NOTE — Progress Notes (Signed)
Patient ID: Bianca Haley, female   DOB: 22-Mar-1951, 71 y.o.   MRN: [id number] ? ? ? ?    Chief Complaint: follow up feverish, cough, ST, found to have irregular heart rhythm ? ?     HPI:  Bianca Haley is a 71 y.o. female here with c/o 1 wk gradually worsening feverish, general weakness, scant prod cough, sob, and ST and myalgias;  appears at least mild to mod ill today, and hx of recurrent pnuemonia and ETOH use disorder.  No prior hx of afib.  Does have home o2 mostly at night. Pt denies chest pain, orthopnea, PND, increased LE swelling, palpitations, dizziness or syncope.  Pt also with recent wt loss for unclear reason. ?      ?Wt Readings from Last 3 Encounters:  ?09/04/21 135 lb 2.3 oz (61.3 kg)  ?09/02/21 137 lb (62.1 kg)  ?07/17/21 142 lb (64.4 kg)  ? ?BP Readings from Last 3 Encounters:  ?09/06/21 (!) 171/78  ?09/02/21 132/68  ?07/17/21 122/70  ? ?      ?Past Medical History:  ?Diagnosis Date  ? Anemia   ? history of anemia  ? Anxiety   ? Arthritis   ? Chronic low back pain   ? Chronic renal failure   ? patient denies   ? Diverticulosis   ? Dupuytren contracture   ? Right hand, has received injection  ? GERD (gastroesophageal reflux disease)   ? H/O tinnitus   ? left  ? Hearing loss   ? 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  ? Hemorrhoids   ? History of colon polyps   ? History of endometriosis   ? and fibroids  ? History of hiatal hernia   ? History of UTI   ? Hypertension   ? Memory loss   ? Migraine   ? OSA (obstructive sleep apnea)   ? on nocturnal O2  ? Recurrent pneumonia 07/2018  ? Seasonal allergies   ? Vitamin D deficiency   ? Wears glasses   ? ?Past Surgical History:  ?Procedure Laterality Date  ? ABDOMINAL HYSTERECTOMY    ? BACK SURGERY    ? BIOPSY  10/20/2018  ? Procedure: BIOPSY;  Surgeon: [name], MD;  Location: [facility];  Service: Endoscopy;;  ? BLADDER SUSPENSION    ? COLONOSCOPY  02/16/2008  ? normal  ? DENTAL SURGERY    ? ESOPHAGOGASTRODUODENOSCOPY (EGD) WITH PROPOFOL N/A 10/20/2018  ?  Procedure: ESOPHAGOGASTRODUODENOSCOPY (EGD) WITH PROPOFOL;  Surgeon: [name], MD;  Location: [facility];  Service: Endoscopy;  Laterality: N/A;  ? EUS N/A 10/20/2018  ? Procedure: UPPER ENDOSCOPIC ULTRASOUND (EUS) RADIAL;  Surgeon: [name], MD;  Location: [facility];  Service: Endoscopy;  Laterality: N/A;  ? FOOT SURGERY Left   ? x3  ? laparoscopy    ? for evaluation of endometriosis  ? LEFT HEART CATH AND CORONARY ANGIOGRAPHY N/A 09/04/2021  ? Procedure: LEFT HEART CATH AND CORONARY ANGIOGRAPHY;  Surgeon: Jettie Booze, MD;  Location: [facility];  Service: Cardiovascular;  Laterality: N/A;  ? TOTAL KNEE ARTHROPLASTY Left 07/15/2015  ? Procedure: TOTAL LEFT KNEE ARTHROPLASTY;  Surgeon: Paralee Cancel, MD;  Location: [facility];  Service: Orthopedics;  Laterality: Left;  ? UPPER GI ENDOSCOPY    ? ? reports that she has never smoked. She has never used smokeless tobacco. She reports that she does not drink alcohol and does not use drugs. ?family history includes Emphysema in her mother; Heart  disease (age of onset: 19) in her mother. ?Allergies  ?Allergen Reactions  ? Penicillins Shortness Of Breath and Rash  ?  Did it involve swelling of the face/tongue/throat, SOB, or low BP? No ?Did it involve sudden or severe rash/hives, skin peeling, or any reaction on the inside of your mouth or nose? Yes ?Did you need to seek medical attention at a hospital or doctor's office? No ?When did it last happen?      10 + years ?If all above answers are ?NO?, may proceed with cephalosporin use. ? ?  ? Morphine And Related Swelling  ?  Sedation/Swelling described like edema/bloating/doesn't help the pain ?Morphine only; tolerates other opioids.   ? Cefepime Rash  ?  Reaction while taking both cefepime and vancomycin at [facility] (after 3 days)  ? Vancomycin Rash  ?  Reaction while taking both cefepime and vancomycin at [facility] (after 3 days)  ? ?Current Outpatient Medications on File Prior to Visit  ?Medication Sig  Dispense Refill  ? acetaminophen (TYLENOL) 650 MG CR tablet Take 1,300 mg by mouth every 8 (eight) hours as needed for pain.    ? albuterol (VENTOLIN HFA) 108 (90 Base) MCG/ACT inhaler INHALE 2 PUFFS INTO THE LUNGS EVERY 4 HOURS AS NEEDED FOR WHEEZING OR SHORTNESS OF BREATH (Patient taking differently: Inhale 2 puffs into the lungs every 4 (four) hours as needed for wheezing or shortness of breath.) 18 g 2  ? Ascorbic Acid (VITAMIN C PO) Take 1 tablet by mouth every morning.    ? Calcium Carb-Cholecalciferol (CALCIUM PLUS VITAMIN D3) 600-500 MG-UNIT CAPS Take 2 tablets by mouth every morning.    ? Cholecalciferol (VITAMIN D) 2000 units tablet Take 2,000 Units by mouth at bedtime.     ? cycloSPORINE (RESTASIS) 0.05 % ophthalmic emulsion Place 1 drop into both eyes every morning.    ? diazepam (VALIUM) 5 MG tablet Take 1 tablet (5 mg total) by mouth daily as needed for anxiety or muscle spasms. 30 tablet 3  ? fluticasone (FLONASE) 50 MCG/ACT nasal spray Place 2 sprays into both nostrils daily as needed for allergies or rhinitis.    ? guaiFENesin (MUCINEX) 600 MG 12 hr tablet Take 1 tablet (600 mg total) by mouth 2 (two) times daily. (Patient taking differently: Take 600 mg by mouth 2 (two) times daily as needed for cough or to loosen phlegm.) 60 tablet 2  ? Melatonin 10 MG SUBL Place 10 mg under the tongue at bedtime.    ? mirabegron ER (MYRBETRIQ) 50 MG TB24 tablet Take 50 mg by mouth every morning.    ? Multiple Vitamin (MULTIVITAMIN WITH MINERALS) TABS tablet Take 1 tablet by mouth 2 (two) times daily.    ? OVER THE COUNTER MEDICATION Take 2 capsules by mouth in the morning. [facility]    ? OXYGEN Inhale 1 L into the lungs See admin instructions. Overnight    ? pantoprazole (PROTONIX) 40 MG tablet TAKE 1 TABLET(40 MG) BY MOUTH TWICE DAILY BEFORE A MEAL (Patient taking differently: 40 mg 2 (two) times daily before a meal.) 180 tablet 3  ? PARoxetine (PAXIL) 40 MG tablet TAKE 1 TABLET(40 MG) BY MOUTH TWICE DAILY  (Patient taking differently: Take 40 mg by mouth 2 (two) times daily.) 180 tablet 3  ? polyethylene glycol (MIRALAX / GLYCOLAX) packet Take 17 g by mouth 2 (two) times daily. (Patient taking differently: Take 17 g by mouth daily as needed (constipation). Mix in 8 oz liquid and drink) 14 each  0  ? potassium chloride (KLOR-CON) 10 MEQ tablet TAKE 1 TABLET BY MOUTH DAILY (Patient taking differently: 10 mEq every morning.) 90 tablet 1  ? Probiotic CAPS Take 1 capsule by mouth every morning.    ? rosuvastatin (CRESTOR) 5 MG tablet TAKE 1 TABLET(5 MG) BY MOUTH DAILY (Patient taking differently: Take 5 mg by mouth every morning.) 90 tablet 3  ? SUMAtriptan (IMITREX) 100 MG tablet TAKE 1 TABLET BY MOUTH DAILY AS NEEDED FOR MIGRAINE. MAY REPEAT IN 2 HOURS IF HEADACHE PERSISTS OR RECURS (Patient taking differently: Take 100 mg by mouth See admin instructions. Take one tablet (100 mg) by mouth at onset of migraine headache, may repeat in 2 hours if headache persists or recurs) 12 tablet 5  ? traZODone (DESYREL) 150 MG tablet TAKE 1 TABLET BY MOUTH AT BEDTIME AS NEEDED FOR SLEEP (Patient taking differently: Take 150 mg by mouth at bedtime.) 30 tablet 11  ? valACYclovir (VALTREX) 500 MG tablet Take 500 mg by mouth See admin instructions. Take one tablet (500 mg) by mouth twice daily for 3-5 days as needed for outbreaks    ? gabapentin (NEURONTIN) 300 MG capsule Take 4 capsules (1,200 mg total) by mouth at bedtime. 120 capsule 0  ? ?No current facility-administered medications on file prior to visit.  ? ?     ROS:  All others reviewed and negative. ? ?Objective  ? ?     PE:  BP 132/68 (BP Location: Left Arm, Patient Position: Sitting, Cuff Size: Large)   Pulse (!) 129   Temp 98 ?F (36.7 ?C) (Oral)   Ht '5\' 5"'$  (1.651 m)   Wt 137 lb (62.1 kg)   SpO2 94%   BMI 22.80 kg/m?  ? ?              Constitutional: Pt appears mod ill, fatigued ?              HENT: Head: NCAT.  ?              Right Ear: External ear normal.   ?               Left Ear: External ear normal.  ?              Eyes: . Pupils are equal, round, and reactive to light. Conjunctivae and EOM are normal ?              Nose: without d/c or deformity ?              Neck: Neck sup

## 2021-09-02 NOTE — ED Notes (Signed)
2 RNs attempted 2nd set of blood cultures and phlebotomy.  Unable to obtain 2nd set of blood cultures.  ?

## 2021-09-02 NOTE — Progress Notes (Signed)
ANTICOAGULATION CONSULT NOTE - Initial Consult ? ?Pharmacy Consult for Heparin ?Indication: atrial fibrillation ? ?Allergies  ?Allergen Reactions  ? Penicillins Shortness Of Breath and Rash  ?  Did it involve swelling of the face/tongue/throat, SOB, or low BP? No ?Did it involve sudden or severe rash/hives, skin peeling, or any reaction on the inside of your mouth or nose? Yes ?Did you need to seek medical attention at a hospital or doctor's office? No ?When did it last happen?      10 + years ?If all above answers are ?NO?, may proceed with cephalosporin use. ? ?  ? Morphine And Related Swelling  ?  Sedation/Swelling described like edema/bloating/doesn't help the pain ?Morphine only; tolerates other opioids.   ? Cefepime Rash  ?  Reaction while taking both cefepime and vancomycin at [facility] (after 3 days)  ? Vancomycin Rash  ?  Reaction while taking both cefepime and vancomycin at [facility] (after 3 days)  ? ? ?Patient Measurements: ?Height: '5\' 5"'$  (165.1 cm) ?Weight: 62.1 kg (137 lb) ?IBW/kg (Calculated) : 57 ?Heparin Dosing Weight: 62.1 kg ? ?Vital Signs: ?Temp: 98.3 ?F (36.8 ?C) (05/02 1255) ?Temp Source: Oral (05/02 1255) ?BP: 134/69 (05/02 1535) ?Pulse Rate: 84 (05/02 1535) ? ?Labs: ?Recent Labs  ?  09/02/21 ?1302  ?HGB 14.5  ?HCT 43.2  ?PLT 321  ?CREATININE 1.42*  ? ? ?Estimated Creatinine Clearance: 31.8 mL/min (A) (by C-G formula based on SCr of 1.42 mg/dL (H)). ? ? ?Medical History: ?Past Medical History:  ?Diagnosis Date  ? Anemia   ? history of anemia  ? Anxiety   ? Arthritis   ? Chronic low back pain   ? Chronic renal failure   ? patient denies   ? Diverticulosis   ? Dupuytren contracture   ? Right hand, has received injection  ? GERD (gastroesophageal reflux disease)   ? H/O tinnitus   ? left  ? Hearing loss   ? 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  ? Hemorrhoids   ? History of colon polyps   ? History of endometriosis   ? and fibroids  ? History of hiatal hernia   ? History of UTI   ? Hypertension   ? Memory loss   ?  Migraine   ? OSA (obstructive sleep apnea)   ? on nocturnal O2  ? Recurrent pneumonia 07/2018  ? Seasonal allergies   ? Vitamin D deficiency   ? Wears glasses   ? ? ?Medications:  ?Scheduled:  ? aspirin EC  81 mg Oral Daily  ? ?Infusions:  ? doxycycline (VIBRAMYCIN) IV 100 mg (09/02/21 1553)  ? potassium chloride 10 mEq (09/02/21 1552)  ? ? ?Assessment: ?71 yo F presenting with paroxysmal atrial fibrillation, not on anticoagulation PTA. Pharmacy consulted for heparin dosing.  ? ?Goal of Therapy:  ?Heparin level 0.3-0.7 units/ml ?Monitor platelets by anticoagulation protocol: Yes ?  ?Plan:  ?Give heparin bolus of 3500 units, followed by infusion at 900 units/hr. ?Check ~8 hr heparin level.  ?Daily CBC, heparin level. ?Monitor for signs/symptoms of bleeding. ? ? ?Vance Peper, PharmD ?PGY1 Pharmacy Resident ?09/02/2021 4:26 PM  ? ?Please check AMION for all [place] phone numbers ?After 10:00 PM, call [place] [phone] ? ?

## 2021-09-03 ENCOUNTER — Observation Stay (HOSPITAL_COMMUNITY): Payer: Medicare Other

## 2021-09-03 DIAGNOSIS — Z20822 Contact with and (suspected) exposure to covid-19: Secondary | ICD-10-CM | POA: Diagnosis present

## 2021-09-03 DIAGNOSIS — F1120 Opioid dependence, uncomplicated: Secondary | ICD-10-CM | POA: Diagnosis present

## 2021-09-03 DIAGNOSIS — Y92230 Patient room in hospital as the place of occurrence of the external cause: Secondary | ICD-10-CM | POA: Diagnosis not present

## 2021-09-03 DIAGNOSIS — R0600 Dyspnea, unspecified: Secondary | ICD-10-CM | POA: Diagnosis present

## 2021-09-03 DIAGNOSIS — R778 Other specified abnormalities of plasma proteins: Secondary | ICD-10-CM | POA: Diagnosis present

## 2021-09-03 DIAGNOSIS — Z8249 Family history of ischemic heart disease and other diseases of the circulatory system: Secondary | ICD-10-CM | POA: Diagnosis not present

## 2021-09-03 DIAGNOSIS — I214 Non-ST elevation (NSTEMI) myocardial infarction: Secondary | ICD-10-CM | POA: Diagnosis not present

## 2021-09-03 DIAGNOSIS — E0590 Thyrotoxicosis, unspecified without thyrotoxic crisis or storm: Secondary | ICD-10-CM | POA: Diagnosis present

## 2021-09-03 DIAGNOSIS — N179 Acute kidney failure, unspecified: Secondary | ICD-10-CM | POA: Diagnosis present

## 2021-09-03 DIAGNOSIS — I351 Nonrheumatic aortic (valve) insufficiency: Secondary | ICD-10-CM

## 2021-09-03 DIAGNOSIS — E785 Hyperlipidemia, unspecified: Secondary | ICD-10-CM | POA: Diagnosis present

## 2021-09-03 DIAGNOSIS — Z90710 Acquired absence of both cervix and uterus: Secondary | ICD-10-CM | POA: Diagnosis not present

## 2021-09-03 DIAGNOSIS — R002 Palpitations: Secondary | ICD-10-CM | POA: Diagnosis present

## 2021-09-03 DIAGNOSIS — I083 Combined rheumatic disorders of mitral, aortic and tricuspid valves: Secondary | ICD-10-CM | POA: Diagnosis present

## 2021-09-03 DIAGNOSIS — T17928A Food in respiratory tract, part unspecified causing other injury, initial encounter: Secondary | ICD-10-CM | POA: Diagnosis not present

## 2021-09-03 DIAGNOSIS — J9601 Acute respiratory failure with hypoxia: Secondary | ICD-10-CM

## 2021-09-03 DIAGNOSIS — M545 Low back pain, unspecified: Secondary | ICD-10-CM | POA: Diagnosis present

## 2021-09-03 DIAGNOSIS — E876 Hypokalemia: Secondary | ICD-10-CM | POA: Diagnosis present

## 2021-09-03 DIAGNOSIS — Z9981 Dependence on supplemental oxygen: Secondary | ICD-10-CM | POA: Diagnosis not present

## 2021-09-03 DIAGNOSIS — G894 Chronic pain syndrome: Secondary | ICD-10-CM | POA: Diagnosis present

## 2021-09-03 DIAGNOSIS — I480 Paroxysmal atrial fibrillation: Secondary | ICD-10-CM | POA: Diagnosis not present

## 2021-09-03 DIAGNOSIS — I10 Essential (primary) hypertension: Secondary | ICD-10-CM | POA: Diagnosis present

## 2021-09-03 DIAGNOSIS — Z86010 Personal history of colonic polyps: Secondary | ICD-10-CM | POA: Diagnosis not present

## 2021-09-03 DIAGNOSIS — Z87440 Personal history of urinary (tract) infections: Secondary | ICD-10-CM | POA: Diagnosis not present

## 2021-09-03 DIAGNOSIS — F39 Unspecified mood [affective] disorder: Secondary | ICD-10-CM | POA: Diagnosis present

## 2021-09-03 DIAGNOSIS — E860 Dehydration: Secondary | ICD-10-CM | POA: Diagnosis present

## 2021-09-03 DIAGNOSIS — H9190 Unspecified hearing loss, unspecified ear: Secondary | ICD-10-CM | POA: Diagnosis present

## 2021-09-03 DIAGNOSIS — J069 Acute upper respiratory infection, unspecified: Secondary | ICD-10-CM | POA: Diagnosis not present

## 2021-09-03 DIAGNOSIS — Z96652 Presence of left artificial knee joint: Secondary | ICD-10-CM | POA: Diagnosis present

## 2021-09-03 DIAGNOSIS — X58XXXA Exposure to other specified factors, initial encounter: Secondary | ICD-10-CM | POA: Diagnosis not present

## 2021-09-03 DIAGNOSIS — Z8701 Personal history of pneumonia (recurrent): Secondary | ICD-10-CM | POA: Diagnosis not present

## 2021-09-03 DIAGNOSIS — J40 Bronchitis, not specified as acute or chronic: Secondary | ICD-10-CM | POA: Diagnosis present

## 2021-09-03 LAB — ECHOCARDIOGRAM COMPLETE
Area-P 1/2: 3.66 cm2
Calc EF: 67.8 %
Height: 65 in
MV VTI: 3.77 cm2
P 1/2 time: 412 msec
S' Lateral: 2.3 cm
Single Plane A2C EF: 73 %
Single Plane A4C EF: 64.3 %
Weight: 2192 oz

## 2021-09-03 LAB — CBC
HCT: 36.3 % (ref 36.0–46.0)
Hemoglobin: 11.8 g/dL — ABNORMAL LOW (ref 12.0–15.0)
MCH: 28.2 pg (ref 26.0–34.0)
MCHC: 32.5 g/dL (ref 30.0–36.0)
MCV: 86.6 fL (ref 80.0–100.0)
Platelets: 278 10*3/uL (ref 150–400)
RBC: 4.19 MIL/uL (ref 3.87–5.11)
RDW: 13.2 % (ref 11.5–15.5)
WBC: 8.6 10*3/uL (ref 4.0–10.5)
nRBC: 0 % (ref 0.0–0.2)

## 2021-09-03 LAB — BASIC METABOLIC PANEL
Anion gap: 11 (ref 5–15)
BUN: 16 mg/dL (ref 8–23)
CO2: 27 mmol/L (ref 22–32)
Calcium: 8.5 mg/dL — ABNORMAL LOW (ref 8.9–10.3)
Chloride: 100 mmol/L (ref 98–111)
Creatinine, Ser: 0.79 mg/dL (ref 0.44–1.00)
GFR, Estimated: >60 mL/min (ref >60)
Glucose, Bld: 99 mg/dL (ref 70–99)
Potassium: 3.6 mmol/L (ref 3.5–5.1)
Sodium: 138 mmol/L (ref 135–145)

## 2021-09-03 LAB — HEPARIN LEVEL (UNFRACTIONATED)
Heparin Unfractionated: 0.94 IU/mL — ABNORMAL HIGH (ref 0.30–0.70)
Heparin Unfractionated: 0.5 IU/mL (ref 0.30–0.70)

## 2021-09-03 LAB — TSH: TSH: 0.257 u[IU]/mL — ABNORMAL LOW (ref 0.350–4.500)

## 2021-09-03 MED ORDER — SODIUM CHLORIDE 0.9% FLUSH
3.0000 mL | Freq: Two times a day (BID) | INTRAVENOUS | Status: DC
  Administered 2021-09-03 (×2): 3 mL via INTRAVENOUS

## 2021-09-03 MED ORDER — POLYVINYL ALCOHOL 1.4 % OP SOLN
1.0000 [drp] | Freq: Every day | OPHTHALMIC | Status: DC
  Administered 2021-09-03: 1 [drp] via OPHTHALMIC
  Filled 2021-09-03: qty 15

## 2021-09-03 MED ORDER — SODIUM CHLORIDE 0.9% FLUSH
10.0000 mL | Freq: Two times a day (BID) | INTRAVENOUS | Status: DC
  Administered 2021-09-03: 20 mL
  Administered 2021-09-03: 10 mL

## 2021-09-03 MED ORDER — SODIUM CHLORIDE 0.9 % WEIGHT BASED INFUSION: 3.0000 mL/kg/h | INTRAVENOUS | Status: DC

## 2021-09-03 MED ORDER — SODIUM CHLORIDE 0.9 % WEIGHT BASED INFUSION
1.0000 mL/kg/h | INTRAVENOUS | Status: DC
  Administered 2021-09-04: 1 mL/kg/h via INTRAVENOUS

## 2021-09-03 MED ORDER — ASPIRIN 81 MG PO CHEW
81.0000 mg | CHEWABLE_TABLET | ORAL | Status: AC
  Administered 2021-09-04: 81 mg via ORAL
  Filled 2021-09-03: qty 1

## 2021-09-03 MED ORDER — ENSURE ENLIVE PO LIQD
237.0000 mL | Freq: Two times a day (BID) | ORAL | Status: DC
  Administered 2021-09-03 – 2021-09-06 (×4): 237 mL via ORAL

## 2021-09-03 MED ORDER — SODIUM CHLORIDE 0.9% FLUSH: 10.0000 mL | INTRAVENOUS | Status: DC | PRN

## 2021-09-03 NOTE — H&P (Signed)
A-fib with RVR followed by mild flat rise in troponins and EKG abnormalities suggesting LAD stenosis  ?

## 2021-09-03 NOTE — Progress Notes (Signed)
?  Echocardiogram ?2D Echocardiogram has been performed. ? ?Bobbye Charleston ?09/03/2021, 8:43 AM ?

## 2021-09-03 NOTE — Progress Notes (Signed)
Patient put her hands on primary RN, grabbed her wrist and pulled on her forearm.  ? ?Primary RN was able to pull arm from her and left the room.  ? ?Patient continues to be paranoid.  ?

## 2021-09-03 NOTE — Progress Notes (Signed)
? ?Progress Note ? ?Patient Name: AVIGAIL PILLING ?Date of Encounter: 09/03/2021 ? ?[facility] Cardiologist: New ? ?Subjective  ? ?No CP; mild dyspnea; fatigue ? ?Inpatient Medications  ?  ?Scheduled Meds: ? aspirin EC  81 mg Oral Daily  ? cycloSPORINE  1 drop Both Eyes q morning  ? diltiazem  240 mg Oral q morning  ? gabapentin  1,200 mg Oral QHS  ? guaiFENesin  600 mg Oral BID  ? melatonin  10 mg Oral QHS  ? metoprolol tartrate  50 mg Oral BID  ? oxyCODONE  10 mg Oral TID  ? pantoprazole  40 mg Oral BID AC  ? PARoxetine  40 mg Oral BID  ? Polyethyl Glycol-Propyl Glycol  1 application. Both Eyes QHS  ? predniSONE  20 mg Oral Q breakfast  ? rosuvastatin  5 mg Oral q morning  ? sodium chloride flush  3 mL Intravenous Q12H  ? traZODone  150 mg Oral QHS  ? ?Continuous Infusions: ? doxycycline (VIBRAMYCIN) IV Stopped (09/03/21 0553)  ? heparin 750 Units/hr (09/03/21 0455)  ? ?PRN Meds: ?acetaminophen **OR** acetaminophen, diazepam, guaiFENesin-codeine, hydrALAZINE, metoprolol tartrate  ? ?Vital Signs  ?  ?Vitals:  ? 09/03/21 0100 09/03/21 0200 09/03/21 0400 09/03/21 0600  ?BP: (!) 161/72 (!) 147/72 (!) 162/75 (!) 144/59  ?Pulse: 73 71 73 68  ?Resp: 18 (!) 25 (!) 25 (!) 25  ?Temp:      ?TempSrc:      ?SpO2: 97% 94% 95% 96%  ?Weight:      ?Height:      ? ? ?Intake/Output Summary (Last 24 hours) at 09/03/2021 0815 ?Last data filed at 09/03/2021 0553 ?Gross per 24 hour  ?Intake 1742.02 ml  ?Output --  ?Net 1742.02 ml  ? ? ?  09/02/2021  ? 12:48 PM 09/02/2021  ? 10:59 AM 07/17/2021  ?  3:33 PM  ?Last 3 Weights  ?Weight (lbs) 137 lb 137 lb 142 lb  ?Weight (kg) 62.143 kg 62.143 kg 64.411 kg  ?   ? ?Telemetry  ?  ?Sinus - Personally Reviewed ? ?ECG  ?  ?Normal sinus rhythm, marked anterior T wave inversion, prolonged QT interval, left ventricular hypertrophy.- Personally Reviewed ? ?Physical Exam  ? ?GEN: No acute distress.   ?Neck: No JVD ?Cardiac: RRR, no murmurs, rubs, or gallops.  ?Respiratory: Diminished BS ?GI: Soft, nontender,  non-distended  ?MS: No edema ?Neuro:  Nonfocal  ?Psych: Normal affect  ? ?Labs  ?  ?High Sensitivity Troponin:   ?Recent Labs  ?Lab 09/02/21 ?2050 09/02/21 ?2230  ?TROPONINIHS 173* 186*  ?   ?Chemistry ?Recent Labs  ?Lab 09/02/21 ?1302 09/03/21 ?0215  ?NA 138 138  ?K 3.0* 3.6  ?CL 98 100  ?CO2 22 27  ?GLUCOSE 132* 99  ?BUN 30* 16  ?CREATININE 1.42* 0.79  ?CALCIUM 9.3 8.5*  ?MG 2.2  --   ?PROT 7.4  --   ?ALBUMIN 3.1*  --   ?AST 62*  --   ?ALT 30  --   ?ALKPHOS 83  --   ?BILITOT 1.2  --   ?GFRNONAA 39* >60  ?ANIONGAP 18* 11  ?  ? ?Hematology ?Recent Labs  ?Lab 09/02/21 ?1302 09/03/21 ?0215  ?WBC 11.6* 8.6  ?RBC 5.02 4.19  ?HGB 14.5 11.8*  ?HCT 43.2 36.3  ?MCV 86.1 86.6  ?MCH 28.9 28.2  ?MCHC 33.6 32.5  ?RDW 13.1 13.2  ?PLT 321 278  ? ? ?Radiology  ?  ?DG Chest 2 View ? ?Result  Date: 09/02/2021 ?CLINICAL DATA:  Cough. EXAM: CHEST - 2 VIEW COMPARISON:  March 09, 2021. FINDINGS: Stable cardiomediastinal silhouette. Both lungs are clear. The visualized skeletal structures are unremarkable. IMPRESSION: No active cardiopulmonary disease. Electronically Signed   By: Marijo Conception M.D.   On: 09/02/2021 14:42   ? ? ?Patient Profile  ?   ?71 y.o. female with past medical history of hypertension, endometriosis, gastroesophageal reflux disease admitted with upper respiratory infection and noted to be in atrial fibrillation with rapid ventricular response. ? ?Assessment & Plan  ?  ?Paroxysmal atrial fibrillation-patient remains in sinus rhythm this morning.  Atrial fibrillation may be driven by URI.  We will continue Cardizem and metoprolol for rate control if atrial fibrillation recurs.  Continue heparin.  Will transition to apixaban once all procedures complete.  Await results of echocardiogram.  TSH pending.  ?Abnormal electrocardiogram-with patient's atrial fibrillation with rapid ventricular response she appeared to have ST elevation in the septal leads.  Follow-up ECG when she is in sinus rhythm shows deep anterior T wave  inversion (markedly abnormal). Troponins mildly elevated.  I am concerned about the possibility of an LAD lesion based on her follow-up ECGs.  Plan cardiac catheterization.  The risk and benefits including myocardial infarction, CVA and death discussed and she agrees to proceed.  We will plan to arrange for tomorrow.  Continue heparin, aspirin and metoprolol.  Continue statin.   ?Upper respiratory infection-managed by primary care. ?Hypertension-follow blood pressure mildly elevated.  We will continue to follow and advance medications as needed. ?Hyperlipidemia-continue Crestor at present dose.  If she is documented to have coronary disease we will need to increase to 40 mg daily. ?Acute renal insufficiency-renal function has improved. ?History of mild to moderate aortic insufficiency on prior echocardiogram-await follow-up echocardiogram. ? ?For questions or updates, please contact [facility] ?Please consult [email or web address] for contact info under  ? ?  ?   ?Signed, ?Kirk Ruths, MD  ?09/03/2021, 8:15 AM   ? ?

## 2021-09-03 NOTE — Progress Notes (Signed)
ANTICOAGULATION CONSULT NOTE - Follow Up Consult ? ?Pharmacy Consult for heparin ?Indication: atrial fibrillation ? ?Labs: ?Recent Labs  ?  09/02/21 ?1302 09/02/21 ?2050 09/02/21 ?2230 09/03/21 ?0215  ?HGB 14.5  --   --  11.8*  ?HCT 43.2  --   --  36.3  ?PLT 321  --   --  278  ?HEPARINUNFRC  --   --   --  0.94*  ?CREATININE 1.42*  --   --  0.79  ?TROPONINIHS  --  173* 186*  --   ? ? ?Assessment: ?71yo female supratherapeutic on heparin with initial dosing for Afib; no infusion issues or signs of bleeding per RN. ? ?Goal of Therapy:  ?Heparin level 0.3-0.7 units/ml ?  ?Plan:  ?Will decrease heparin infusion by 2 units/kg/hr to 750 units/hr and check level in 8 hours.   ? ?Wynona Neat, PharmD, BCPS  ?09/03/2021,4:37 AM ? ? ?

## 2021-09-03 NOTE — Progress Notes (Signed)
? ? ? [facility] ?                                                                            ? ? ?Bianca Haley, is a 71 y.o. female, DOB - Sep 09, 1950, [id number] ?Admit date - 09/02/2021    ?Outpatient Primary MD for the patient is Plotnikov, [name], MD ? ?LOS - 0  days ? ? ? ?Brief summary  ? ?Bianca Haley is a 71 y.o. female with medical history significant of chronic back pain and HTN who presented to her PCP today with URI symptoms and palpitations. She also reported sob. She was found ot be in afib with RVR and had elevated troponins.  ?Cardiology consulted and she is scheduled for cath in am.  ? ? ?Assessment & Plan  ? ? ?Assessment and Plan: ? ?Paroxysmal atrial fibrillation ?Rate controlled ?Cardiology consulted, echo cardiogram ordered. ?Currently not on IV heparin for anticoagulation. ?  Her CHA2DS2-VASc score is 3 ?Continue with metoprolol and aspirin. ? ? ? ?Elevated troponins probably secondary to A-fib with RVR and  URI symptoms ?Cardiology on board.  ? ? ?Choking episode last night ?SLP evaluation ordered f. ? ? ?URI symptoms ?Patient's COVID 19 is negative respiratory virus panel is negative. ?Symptomatic management with cough medication ? ? ? ?Essential hypertension ?Blood pressure parameters are appear to be optimal ? ? ?Chronic pain syndrome ?Continue with home medications at this time. ? ? ?Hyperlipidemia ?Continue with Crestor. ? ? ?Acute kidney injury probably secondary to dehydration ?Repeat renal parameters in the morning. ? ?Estimated body mass index is 22.16 kg/m? as calculated from the following: ?  Height as of this encounter: 5' 5.5" (1.664 m). ?  Weight as of this encounter: 61.3 kg. ? ?Code Status: full code.  ?DVT Prophylaxis:   ? ? ?Level of Care: Level of care: Telemetry Cardiac ?Family Communication: none at bedside.  ? ?Disposition Plan:     Remains inpatient appropriate:  further work up with cath.  ? ?Procedures:  ?echo ? ?Consultants:    ?cardiology ? ?Antimicrobials:  ? ?Anti-infectives (From admission, onward)  ? ? Start     Dose/Rate Route Frequency Ordered Stop  ? 09/02/21 1500  cefTRIAXone (ROCEPHIN) 1 g in sodium chloride 0.9 % 100 mL IVPB       ? 1 g ?200 mL/hr over 30 Minutes Intravenous  Once 09/02/21 1453 09/02/21 1538  ? 09/02/21 1500  doxycycline (VIBRAMYCIN) 100 mg in sodium chloride 0.9 % 250 mL IVPB       ? 100 mg ?125 mL/hr over 120 Minutes Intravenous Every 12 hours 09/02/21 1453    ? ?  ? ? ? ?Medications ? ?Scheduled Meds: ? [START ON 09/04/2021] aspirin  81 mg Oral Pre-Cath  ? aspirin EC  81 mg Oral Daily  ? cycloSPORINE  1 drop Both Eyes q morning  ? diltiazem  240 mg Oral q morning  ? feeding supplement  237 mL Oral BID BM  ? gabapentin  1,200 mg Oral QHS  ? guaiFENesin  600 mg Oral BID  ? melatonin  10 mg Oral QHS  ? metoprolol tartrate  50 mg Oral BID  ? oxyCODONE  10 mg  Oral TID  ? pantoprazole  40 mg Oral BID AC  ? PARoxetine  40 mg Oral BID  ? polyvinyl alcohol  1 drop Both Eyes QHS  ? predniSONE  20 mg Oral Q breakfast  ? rosuvastatin  5 mg Oral q morning  ? sodium chloride flush  10-40 mL Intracatheter Q12H  ? sodium chloride flush  3 mL Intravenous Q12H  ? sodium chloride flush  3 mL Intravenous Q12H  ? traZODone  150 mg Oral QHS  ? ?Continuous Infusions: ? [START ON 09/04/2021] sodium chloride    ? Followed by  ? [START ON 09/04/2021] sodium chloride    ? doxycycline (VIBRAMYCIN) IV 100 mg (09/03/21 1742)  ? heparin 750 Units/hr (09/03/21 1552)  ? ?PRN Meds:.acetaminophen **OR** acetaminophen, diazepam, guaiFENesin-codeine, hydrALAZINE, metoprolol tartrate, sodium chloride flush ? ? ? ?Subjective:  ? ?Stevi Hollinshead was seen and examined today.  Some sob. No chest pain on 2lit of [place] oxygen.  ? ?Objective:  ? ?Vitals:  ? 09/03/21 1338 09/03/21 1435 09/03/21 1441 09/03/21 1501  ?BP: (!) 160/72 (!) 159/97  (!) 150/75  ?Pulse: 71 73  67  ?Resp: '18 13  18  '$ ?Temp:   98.1 ?F (36.7 ?C) 97.9 ?F (36.6 ?C)  ?TempSrc:   Oral Oral   ?SpO2: 95% 100%  94%  ?Weight:    61.3 kg  ?Height:    5' 5.5" (1.664 m)  ? ? ?Intake/Output Summary (Last 24 hours) at 09/03/2021 1746 ?Last data filed at 09/03/2021 1600 ?Gross per 24 hour  ?Intake 1174.73 ml  ?Output --  ?Net 1174.73 ml  ? ?Filed Weights  ? 09/02/21 1248 09/03/21 1501  ?Weight: 62.1 kg 61.3 kg  ? ? ? ?Exam ?General exam: Appears calm and comfortable  ?Respiratory system: Clear to auscultation. Respiratory effort normal. ?Cardiovascular system: S1 & S2 heard, RRR. No JVD,No pedal edema. ?Gastrointestinal system: Abdomen is nondistended, soft and nontender.  Normal bowel sounds heard. ?Central nervous system: Alert and oriented. No focal neurological deficits. ?Extremities: Symmetric 5 x 5 power. ?Skin: No rashes, lesions or ulcers ?Psychiatry: Judgement and insight appear normal. Mood & affect appropriate.  ? ? ?Data Reviewed:  I have personally reviewed following labs and imaging studies ? ? ?CBC ?Lab Results  ?Component Value Date  ? WBC 8.6 09/03/2021  ? RBC 4.19 09/03/2021  ? HGB 11.8 (L) 09/03/2021  ? HCT 36.3 09/03/2021  ? MCV 86.6 09/03/2021  ? MCH 28.2 09/03/2021  ? PLT 278 09/03/2021  ? MCHC 32.5 09/03/2021  ? RDW 13.2 09/03/2021  ? LYMPHSABS 0.8 09/02/2021  ? MONOABS 1.0 09/02/2021  ? EOSABS 0.0 09/02/2021  ? BASOSABS 0.1 09/02/2021  ? ? ? ?Last metabolic panel ?Lab Results  ?Component Value Date  ? NA 138 09/03/2021  ? K 3.6 09/03/2021  ? CL 100 09/03/2021  ? CO2 27 09/03/2021  ? BUN 16 09/03/2021  ? CREATININE 0.79 09/03/2021  ? GLUCOSE 99 09/03/2021  ? GFRNONAA >60 09/03/2021  ? GFRAA >60 10/20/2018  ? CALCIUM 8.5 (L) 09/03/2021  ? PHOS 4.5 02/21/2013  ? PROT 7.4 09/02/2021  ? ALBUMIN 3.1 (L) 09/02/2021  ? LABGLOB 2.3 11/23/2017  ? AGRATIO 2.0 11/23/2017  ? BILITOT 1.2 09/02/2021  ? ALKPHOS 83 09/02/2021  ? AST 62 (H) 09/02/2021  ? ALT 30 09/02/2021  ? ANIONGAP 11 09/03/2021  ? ? ?CBG (last 3)  ?No results for input(s): GLUCAP in the last 72 hours.  ? ? ?Coagulation Profile: ?No results  for input(s): INR,  PROTIME in the last 168 hours. ? ? ?Radiology Studies: ?DG Chest 2 View ? ?Result Date: 09/02/2021 ?CLINICAL DATA:  Cough. EXAM: CHEST - 2 VIEW COMPARISON:  March 09, 2021. FINDINGS: Stable cardiomediastinal silhouette. Both lungs are clear. The visualized skeletal structures are unremarkable. IMPRESSION: No active cardiopulmonary disease. Electronically Signed   By: Marijo Conception M.D.   On: 09/02/2021 14:42  ? ?ECHOCARDIOGRAM COMPLETE ? ?Result Date: 09/03/2021 ?   ECHOCARDIOGRAM REPORT   Patient Name:   ORELLA CUSHMAN Bais Date of Exam: 09/03/2021 Medical Rec #:  [id number]       Height:       65.0 in Accession #:    [id number]      Weight:       137.0 lb Date of Birth:  07/12/1950       BSA:          1.684 m? Patient Age:    71 years        BP:           144/59 mmHg Patient Gender: F               HR:           68 bpm. Exam Location:  Inpatient Procedure: 2D Echo, 3D Echo, Cardiac Doppler and Color Doppler Indications:    I35.1 Nonrheumatic aortic (valve) insufficiency  History:        Patient has prior history of Echocardiogram examinations, most                 recent 07/13/2018. Abnormal ECG, Aortic Valve Disease,                 Arrythmias:Atrial Fibrillation, Signs/Symptoms:Shortness of                 Breath and Dyspnea; Risk Factors:Hypertension and Dyslipidemia.                 ETOH.  Sonographer:    Roseanna Rainbow RDCS Referring Phys: Opal Comments: Exam delayed for occluded IV IMPRESSIONS  1. Left ventricular ejection fraction, by estimation, is 65 to 70%. The left ventricle has normal function. The left ventricle has no regional wall motion abnormalities. Left ventricular diastolic parameters are consistent with Grade I diastolic dysfunction (impaired relaxation).  2. Right ventricular systolic function is normal. The right ventricular size is normal. There is moderately elevated pulmonary artery systolic pressure. The estimated right ventricular systolic pressure is  50.0 mmHg.  3. Left atrial size was mildly dilated.  4. The mitral valve is degenerative. Moderate mitral valve regurgitation. No evidence of mitral stenosis.  5. Tricuspid valve regurgitation is mild to moderate.  6. The aortic

## 2021-09-03 NOTE — Plan of Care (Signed)
?  Problem: Activity: ?Goal: Ability to tolerate increased activity will improve ?Outcome: Progressing ?  ?Problem: Clinical Measurements: ?Goal: Ability to maintain a body temperature in the normal range will improve ?Outcome: Progressing ?  ?Problem: Education: ?Goal: Knowledge of disease or condition will improve ?Outcome: Progressing ?Goal: Understanding of medication regimen will improve ?Outcome: Progressing ?Goal: Individualized Educational Video(s) ?Outcome: Progressing ?  ?

## 2021-09-03 NOTE — Progress Notes (Signed)
ANTICOAGULATION CONSULT NOTE ? ?Pharmacy Consult for Heparin ?Indication: atrial fibrillation ? ?Patient Measurements: ?Height: '5\' 5"'$  (165.1 cm) ?Weight: 62.1 kg (137 lb) ?IBW/kg (Calculated) : 57 ?Heparin Dosing Weight: 62.1 kg ? ?Vital Signs: ?BP: 160/72 (05/03 1338) ?Pulse Rate: 71 (05/03 1338) ? ?Labs: ?Recent Labs  ?  09/02/21 ?1302 09/02/21 ?2050 09/02/21 ?2230 09/03/21 ?0215 09/03/21 ?1347  ?HGB 14.5  --   --  11.8*  --   ?HCT 43.2  --   --  36.3  --   ?PLT 321  --   --  278  --   ?HEPARINUNFRC  --   --   --  0.94* 0.50  ?CREATININE 1.42*  --   --  0.79  --   ?TROPONINIHS  --  173* 186*  --   --   ? ? ? ?Estimated Creatinine Clearance: 56.4 mL/min (by C-G formula based on SCr of 0.79 mg/dL). ? ?Medications:  ?Scheduled:  ? [START ON 09/04/2021] aspirin  81 mg Oral Pre-Cath  ? aspirin EC  81 mg Oral Daily  ? cycloSPORINE  1 drop Both Eyes q morning  ? diltiazem  240 mg Oral q morning  ? gabapentin  1,200 mg Oral QHS  ? guaiFENesin  600 mg Oral BID  ? melatonin  10 mg Oral QHS  ? metoprolol tartrate  50 mg Oral BID  ? oxyCODONE  10 mg Oral TID  ? pantoprazole  40 mg Oral BID AC  ? PARoxetine  40 mg Oral BID  ? Polyethyl Glycol-Propyl Glycol  1 application. Both Eyes QHS  ? predniSONE  20 mg Oral Q breakfast  ? rosuvastatin  5 mg Oral q morning  ? sodium chloride flush  10-40 mL Intracatheter Q12H  ? sodium chloride flush  3 mL Intravenous Q12H  ? sodium chloride flush  3 mL Intravenous Q12H  ? traZODone  150 mg Oral QHS  ? ?Infusions:  ? [START ON 09/04/2021] sodium chloride    ? Followed by  ? [START ON 09/04/2021] sodium chloride    ? doxycycline (VIBRAMYCIN) IV Stopped (09/03/21 0553)  ? heparin 750 Units/hr (09/03/21 0455)  ? ? ?Assessment: ?71 yo F presenting with paroxysmal atrial fibrillation, not on anticoagulation PTA. Pharmacy consulted for heparin dosing. Heparin level is now therapeutic at 0.5. No bleeding noted.  ? ?Goal of Therapy:  ?Heparin level 0.3-0.7 units/ml ?Monitor platelets by anticoagulation  protocol: Yes ?  ?Plan:  ?Continue heparin gtt 750 units/hr ?Daily heparin level and CBC ?F/u long-term AC plans ? ?Salome Arnt, PharmD, BCPS ?Clinical Pharmacist ?Please see AMION for all pharmacy numbers ?09/03/2021 2:41 PM ? ? ?

## 2021-09-03 NOTE — Progress Notes (Signed)
Patient was found attempting to exit the bed, "I was trying to turn off the lights". She was eaasily reoriented and assisted to the bed. Bed alarm was restarted, coaching was provided regarding safety measures.  ?

## 2021-09-03 NOTE — Progress Notes (Signed)
Patient continues being erratic and paranoid. She is refusing her medication and refusing care.  ? ? ? ?Any attempts to reorient her are unsuccessful.  ?

## 2021-09-03 NOTE — ED Notes (Signed)
Breakfast order placed ?

## 2021-09-03 NOTE — Progress Notes (Signed)
Pt is becoming combative refusing care from staff and trying to pull out IV. Paged hospitalist Dr. Cyd Silence. ?

## 2021-09-03 NOTE — Progress Notes (Signed)
Patient is behaving erratically, attempting to leave the bed, and removing essential equipment.  ? ?She is restless and crying.  ? ?She is on the phone with her daughter saying, "I am scared, they are hurting me".  ? ? ?Patient verbalizes " this is not ok, I hope I don't turn up dead".  ? ? ?Will contact primary team.  ? ? ? ? ? ? ? ? ? ? ? ? ? ? ? ? ? ? ? ? ? ? ? ? ? ? ? ?

## 2021-09-03 NOTE — ED Notes (Signed)
Patient continues to removed cardiac leads.  Advised they need to stay on.  ?

## 2021-09-04 ENCOUNTER — Encounter (HOSPITAL_COMMUNITY): Payer: Self-pay | Admitting: Interventional Cardiology

## 2021-09-04 ENCOUNTER — Other Ambulatory Visit (HOSPITAL_COMMUNITY): Payer: Self-pay

## 2021-09-04 ENCOUNTER — Inpatient Hospital Stay (HOSPITAL_COMMUNITY)
Admission: EM | Disposition: A | Discharge status: Home Health | Payer: Self-pay | Source: Home / Self Care | Attending: Internal Medicine

## 2021-09-04 DIAGNOSIS — R778 Other specified abnormalities of plasma proteins: Secondary | ICD-10-CM

## 2021-09-04 DIAGNOSIS — I480 Paroxysmal atrial fibrillation: Secondary | ICD-10-CM | POA: Diagnosis not present

## 2021-09-04 DIAGNOSIS — J9601 Acute respiratory failure with hypoxia: Secondary | ICD-10-CM | POA: Diagnosis not present

## 2021-09-04 DIAGNOSIS — N179 Acute kidney failure, unspecified: Secondary | ICD-10-CM | POA: Diagnosis not present

## 2021-09-04 DIAGNOSIS — F1120 Opioid dependence, uncomplicated: Secondary | ICD-10-CM | POA: Diagnosis not present

## 2021-09-04 DIAGNOSIS — J069 Acute upper respiratory infection, unspecified: Secondary | ICD-10-CM | POA: Diagnosis not present

## 2021-09-04 DIAGNOSIS — R7989 Other specified abnormal findings of blood chemistry: Secondary | ICD-10-CM

## 2021-09-04 HISTORY — PX: LEFT HEART CATH AND CORONARY ANGIOGRAPHY: CATH118249

## 2021-09-04 LAB — CBC
HCT: 39.3 % (ref 36.0–46.0)
Hemoglobin: 13.4 g/dL (ref 12.0–15.0)
MCH: 29.1 pg (ref 26.0–34.0)
MCHC: 34.1 g/dL (ref 30.0–36.0)
MCV: 85.2 fL (ref 80.0–100.0)
Platelets: 357 10*3/uL (ref 150–400)
RBC: 4.61 MIL/uL (ref 3.87–5.11)
RDW: 13.1 % (ref 11.5–15.5)
WBC: 11.2 10*3/uL — ABNORMAL HIGH (ref 4.0–10.5)
nRBC: 0 % (ref 0.0–0.2)

## 2021-09-04 LAB — HEPARIN LEVEL (UNFRACTIONATED)
Heparin Unfractionated: 0.19 IU/mL — ABNORMAL LOW (ref 0.30–0.70)
Heparin Unfractionated: 0.17 IU/mL — ABNORMAL LOW (ref 0.30–0.70)

## 2021-09-04 SURGERY — LEFT HEART CATH AND CORONARY ANGIOGRAPHY
Anesthesia: LOCAL

## 2021-09-04 MED ORDER — HEPARIN SODIUM (PORCINE) 1000 UNIT/ML IJ SOLN
INTRAMUSCULAR | Status: DC | PRN
  Administered 2021-09-04: 3000 [IU] via INTRAVENOUS

## 2021-09-04 MED ORDER — BENZONATATE 100 MG PO CAPS
100.0000 mg | ORAL_CAPSULE | Freq: Three times a day (TID) | ORAL | Status: DC | PRN
  Administered 2021-09-04 – 2021-09-05 (×3): 100 mg via ORAL
  Filled 2021-09-04 (×3): qty 1

## 2021-09-04 MED ORDER — HEPARIN (PORCINE) IN NACL 1000-0.9 UT/500ML-% IV SOLN
INTRAVENOUS | Status: AC
  Filled 2021-09-04: qty 1000

## 2021-09-04 MED ORDER — IOHEXOL 350 MG/ML SOLN
INTRAVENOUS | Status: DC | PRN
  Administered 2021-09-04: 45 mL

## 2021-09-04 MED ORDER — DOXYCYCLINE HYCLATE 100 MG PO TABS
100.0000 mg | ORAL_TABLET | Freq: Two times a day (BID) | ORAL | Status: DC
Start: 2021-09-04 — End: 2021-09-06
  Administered 2021-09-04 – 2021-09-06 (×4): 100 mg via ORAL
  Filled 2021-09-04 (×4): qty 1

## 2021-09-04 MED ORDER — MIDAZOLAM HCL 2 MG/2ML IJ SOLN
INTRAMUSCULAR | Status: DC | PRN
  Administered 2021-09-04: 2 mg via INTRAVENOUS
  Administered 2021-09-04: 1 mg via INTRAVENOUS

## 2021-09-04 MED ORDER — HYDRALAZINE HCL 25 MG PO TABS
25.0000 mg | ORAL_TABLET | Freq: Four times a day (QID) | ORAL | Status: DC | PRN
Start: 2021-09-04 — End: 2021-09-06
  Administered 2021-09-05: 25 mg via ORAL
  Filled 2021-09-04 (×2): qty 1

## 2021-09-04 MED ORDER — HEPARIN (PORCINE) 25000 UT/250ML-% IV SOLN
1000.0000 [IU]/h | INTRAVENOUS | Status: DC
  Administered 2021-09-04: 850 [IU]/h via INTRAVENOUS

## 2021-09-04 MED ORDER — LABETALOL HCL 5 MG/ML IV SOLN: 10.0000 mg | INTRAVENOUS | Status: AC | PRN

## 2021-09-04 MED ORDER — HYDRALAZINE HCL 20 MG/ML IJ SOLN: 10.0000 mg | INTRAMUSCULAR | Status: AC | PRN

## 2021-09-04 MED ORDER — FENTANYL CITRATE (PF) 100 MCG/2ML IJ SOLN
INTRAMUSCULAR | Status: AC
Start: 2021-09-04 — End: ?
  Filled 2021-09-04: qty 2

## 2021-09-04 MED ORDER — HEPARIN (PORCINE) IN NACL 1000-0.9 UT/500ML-% IV SOLN
INTRAVENOUS | Status: DC | PRN
  Administered 2021-09-04 (×2): 500 mL

## 2021-09-04 MED ORDER — ADULT MULTIVITAMIN W/MINERALS CH
1.0000 | ORAL_TABLET | Freq: Every day | ORAL | Status: DC
  Administered 2021-09-04 – 2021-09-05 (×2): 1 via ORAL
  Filled 2021-09-04 (×3): qty 1

## 2021-09-04 MED ORDER — SODIUM CHLORIDE 0.9% FLUSH: 3.0000 mL | Freq: Two times a day (BID) | INTRAVENOUS | Status: DC

## 2021-09-04 MED ORDER — LIDOCAINE HCL (PF) 1 % IJ SOLN
INTRAMUSCULAR | Status: AC
  Filled 2021-09-04: qty 30

## 2021-09-04 MED ORDER — MIDAZOLAM HCL 2 MG/2ML IJ SOLN
INTRAMUSCULAR | Status: AC
  Filled 2021-09-04: qty 2

## 2021-09-04 MED ORDER — SODIUM CHLORIDE 0.9 % IV SOLN: 250.0000 mL | INTRAVENOUS | Status: DC | PRN

## 2021-09-04 MED ORDER — SODIUM CHLORIDE 0.9% FLUSH: 3.0000 mL | INTRAVENOUS | Status: DC | PRN

## 2021-09-04 MED ORDER — VERAPAMIL HCL 2.5 MG/ML IV SOLN
INTRAVENOUS | Status: AC
  Filled 2021-09-04: qty 2

## 2021-09-04 MED ORDER — VERAPAMIL HCL 2.5 MG/ML IV SOLN
INTRAVENOUS | Status: DC | PRN
  Administered 2021-09-04: 10 mL via INTRA_ARTERIAL

## 2021-09-04 MED ORDER — IRBESARTAN 150 MG PO TABS
150.0000 mg | ORAL_TABLET | Freq: Every day | ORAL | Status: DC
Start: 2021-09-04 — End: 2021-09-06
  Administered 2021-09-04 – 2021-09-06 (×3): 150 mg via ORAL
  Filled 2021-09-04 (×3): qty 1

## 2021-09-04 MED ORDER — SODIUM CHLORIDE 0.9 % IV SOLN: INTRAVENOUS | Status: AC

## 2021-09-04 MED ORDER — HYDRALAZINE HCL 20 MG/ML IJ SOLN
INTRAMUSCULAR | Status: DC | PRN
  Administered 2021-09-04: 10 mg via INTRAVENOUS

## 2021-09-04 MED ORDER — ACETAMINOPHEN 325 MG PO TABS: 650.0000 mg | ORAL_TABLET | ORAL | Status: DC | PRN

## 2021-09-04 MED ORDER — HYDROXYZINE HCL 25 MG PO TABS
25.0000 mg | ORAL_TABLET | Freq: Once | ORAL | Status: AC
  Administered 2021-09-04: 25 mg via ORAL
  Filled 2021-09-04: qty 1

## 2021-09-04 MED ORDER — FENTANYL CITRATE (PF) 100 MCG/2ML IJ SOLN
INTRAMUSCULAR | Status: DC | PRN
Start: 2021-09-04 — End: 2021-09-04
  Administered 2021-09-04 (×2): 25 ug via INTRAVENOUS

## 2021-09-04 SURGICAL SUPPLY — 10 items
CATH 5FR JL3.5 JR4 ANG PIG MP (CATHETERS) ×1 IMPLANT
DEVICE RAD COMP TR BAND LRG (VASCULAR PRODUCTS) ×1 IMPLANT
GLIDESHEATH SLEND SS 6F .021 (SHEATH) ×1 IMPLANT
GUIDEWIRE INQWIRE 1.5J.035X260 (WIRE) IMPLANT
INQWIRE 1.5J .035X260CM (WIRE) ×2
KIT HEART LEFT (KITS) ×2 IMPLANT
PACK CARDIAC CATHETERIZATION (CUSTOM PROCEDURE TRAY) ×2 IMPLANT
SHEATH PROBE COVER 6X72 (BAG) ×1 IMPLANT
TRANSDUCER W/STOPCOCK (MISCELLANEOUS) ×2 IMPLANT
TUBING CIL FLEX 10 FLL-RA (TUBING) ×2 IMPLANT

## 2021-09-04 NOTE — Progress Notes (Addendum)
? ?Progress Note ? ?Patient Name: Bianca Haley ?Date of Encounter: 09/04/2021 ? ?[facility] Cardiologist: New ? ?Subjective  ? ?No CP; mild dyspnea; fatigue ? ?Inpatient Medications  ?  ?Scheduled Meds: ? aspirin EC  81 mg Oral Daily  ? cycloSPORINE  1 drop Both Eyes q morning  ? diltiazem  240 mg Oral q morning  ? doxycycline  100 mg Oral Q12H  ? feeding supplement  237 mL Oral BID BM  ? gabapentin  1,200 mg Oral QHS  ? guaiFENesin  600 mg Oral BID  ? melatonin  10 mg Oral QHS  ? metoprolol tartrate  50 mg Oral BID  ? oxyCODONE  10 mg Oral TID  ? pantoprazole  40 mg Oral BID AC  ? PARoxetine  40 mg Oral BID  ? polyvinyl alcohol  1 drop Both Eyes QHS  ? predniSONE  20 mg Oral Q breakfast  ? rosuvastatin  5 mg Oral q morning  ? sodium chloride flush  10-40 mL Intracatheter Q12H  ? sodium chloride flush  3 mL Intravenous Q12H  ? sodium chloride flush  3 mL Intravenous Q12H  ? traZODone  150 mg Oral QHS  ? ?Continuous Infusions: ? sodium chloride    ? sodium chloride 1 mL/kg/hr (09/04/21 0649)  ? heparin 750 Units/hr (09/04/21 0659)  ? ?PRN Meds: ?sodium chloride, acetaminophen **OR** acetaminophen, diazepam, guaiFENesin-codeine, hydrALAZINE, metoprolol tartrate, sodium chloride flush, sodium chloride flush  ? ?Vital Signs  ?  ?Vitals:  ? 09/04/21 0200 09/04/21 0444 09/04/21 0655 09/04/21 0819  ?BP: 135/68  (!) 149/76   ?Pulse: 67  78 82  ?Resp: 20  18   ?Temp:   98.3 ?F (36.8 ?C)   ?TempSrc:   Oral   ?SpO2: 97%  96% 90%  ?Weight:  61.3 kg    ?Height:      ? ? ?Intake/Output Summary (Last 24 hours) at 09/04/2021 0828 ?Last data filed at 09/04/2021 0716 ?Gross per 24 hour  ?Intake 1296.08 ml  ?Output 400 ml  ?Net 896.08 ml  ? ? ? ?  09/04/2021  ?  4:44 AM 09/03/2021  ?  3:01 PM 09/02/2021  ? 12:48 PM  ?Last 3 Weights  ?Weight (lbs) 135 lb 2.3 oz 135 lb 3.2 oz 137 lb  ?Weight (kg) 61.3 kg 61.326 kg 62.143 kg  ?   ? ?Telemetry  ?  ?Sinus - Personally Reviewed ? ?ECG  ?  ?Normal sinus rhythm, marked anterior T wave inversion,  prolonged QT interval, left ventricular hypertrophy.- Personally Reviewed ? ?Physical Exam  ? ?GEN: No acute distress.   ?Neck: No JVD ?Cardiac: RRR, no murmurs, rubs, or gallops.  ?Respiratory: Diminished BS ?GI: Soft, nontender, non-distended  ?MS: No edema ?Neuro:  Nonfocal  ?Psych: Normal affect  ? ?Labs  ?  ?High Sensitivity Troponin:   ?Recent Labs  ?Lab 09/02/21 ?2050 09/02/21 ?2230  ?TROPONINIHS 173* 186*  ? ?   ?Chemistry ?Recent Labs  ?Lab 09/02/21 ?1302 09/03/21 ?0215  ?NA 138 138  ?K 3.0* 3.6  ?CL 98 100  ?CO2 22 27  ?GLUCOSE 132* 99  ?BUN 30* 16  ?CREATININE 1.42* 0.79  ?CALCIUM 9.3 8.5*  ?MG 2.2  --   ?PROT 7.4  --   ?ALBUMIN 3.1*  --   ?AST 62*  --   ?ALT 30  --   ?ALKPHOS 83  --   ?BILITOT 1.2  --   ?GFRNONAA 39* >60  ?ANIONGAP 18* 11  ? ?  ? ?  Hematology ?Recent Labs  ?Lab 09/02/21 ?1302 09/03/21 ?0215 09/04/21 ?8938  ?WBC 11.6* 8.6 11.2*  ?RBC 5.02 4.19 4.61  ?HGB 14.5 11.8* 13.4  ?HCT 43.2 36.3 39.3  ?MCV 86.1 86.6 85.2  ?MCH 28.9 28.2 29.1  ?MCHC 33.6 32.5 34.1  ?RDW 13.1 13.2 13.1  ?PLT 321 278 357  ? ? ? ?Radiology  ?  ?DG Chest 2 View ? ?Result Date: 09/02/2021 ?CLINICAL DATA:  Cough. EXAM: CHEST - 2 VIEW COMPARISON:  March 09, 2021. FINDINGS: Stable cardiomediastinal silhouette. Both lungs are clear. The visualized skeletal structures are unremarkable. IMPRESSION: No active cardiopulmonary disease. Electronically Signed   By: Marijo Conception M.D.   On: 09/02/2021 14:42  ? ?ECHOCARDIOGRAM COMPLETE ? ?Result Date: 09/03/2021 ?   ECHOCARDIOGRAM REPORT   Patient Name:   Bianca Haley Date of Exam: 09/03/2021 Medical Rec #:  [id number]       Height:       65.0 in Accession #:    [id number]      Weight:       137.0 lb Date of Birth:  07-17-1950       BSA:          1.684 m? Patient Age:    71 years        BP:           144/59 mmHg Patient Gender: F               HR:           68 bpm. Exam Location:  Inpatient Procedure: 2D Echo, 3D Echo, Cardiac Doppler and Color Doppler Indications:    I35.1  Nonrheumatic aortic (valve) insufficiency  History:        Patient has prior history of Echocardiogram examinations, most                 recent 07/13/2018. Abnormal ECG, Aortic Valve Disease,                 Arrythmias:Atrial Fibrillation, Signs/Symptoms:Shortness of                 Breath and Dyspnea; Risk Factors:Hypertension and Dyslipidemia.                 ETOH.  Sonographer:    Roseanna Rainbow RDCS Referring Phys: [place] Comments: Exam delayed for occluded IV IMPRESSIONS  1. Left ventricular ejection fraction, by estimation, is 65 to 70%. The left ventricle has normal function. The left ventricle has no regional wall motion abnormalities. Left ventricular diastolic parameters are consistent with Grade I diastolic dysfunction (impaired relaxation).  2. Right ventricular systolic function is normal. The right ventricular size is normal. There is moderately elevated pulmonary artery systolic pressure. The estimated right ventricular systolic pressure is 53.6 mmHg.  3. Left atrial size was mildly dilated.  4. The mitral valve is degenerative. Moderate mitral valve regurgitation. No evidence of mitral stenosis.  5. Tricuspid valve regurgitation is mild to moderate.  6. The aortic valve is normal in structure. Aortic valve regurgitation is moderate. No aortic stenosis is present.  7. The inferior vena cava is normal in size with greater than 50% respiratory variability, suggesting right atrial pressure of 3 mmHg. Comparison(s): Mildly increased aortic regurgitiation and mitral regurgitation when compared to prior. FINDINGS  Left Ventricle: Left ventricular ejection fraction, by estimation, is 65 to 70%. The left ventricle has normal function. The left ventricle has no regional wall motion abnormalities.  The left ventricular internal cavity size was normal in size. There is  no left ventricular hypertrophy. Left ventricular diastolic parameters are consistent with Grade I diastolic dysfunction  (impaired relaxation). Right Ventricle: The right ventricular size is normal. No increase in right ventricular wall thickness. Right ventricular systolic function is normal. There is moderately elevated pulmonary artery systolic pressure. The tricuspid regurgitant velocity is 3.26 m/s, and with an assumed right atrial pressure of 15 mmHg, the estimated right ventricular systolic pressure is 26.2 mmHg. Left Atrium: Left atrial size was mildly dilated. Right Atrium: Right atrial size was normal in size. Pericardium: There is no evidence of pericardial effusion. Mitral Valve: The mitral valve is degenerative in appearance. There is moderate thickening of the mitral valve leaflet(s). There is moderate calcification of the mitral valve leaflet(s). Moderate mitral valve regurgitation. No evidence of mitral valve stenosis. MV peak gradient, 7.1 mmHg. The mean mitral valve gradient is 3.0 mmHg. Tricuspid Valve: The tricuspid valve is normal in structure. Tricuspid valve regurgitation is mild to moderate. No evidence of tricuspid stenosis. Aortic Valve: The aortic valve is normal in structure. Aortic valve regurgitation is moderate. Aortic regurgitation PHT measures 412 msec. No aortic stenosis is present. Pulmonic Valve: The pulmonic valve was normal in structure. Pulmonic valve regurgitation is not visualized. No evidence of pulmonic stenosis. Aorta: The aortic root is normal in size and structure. Venous: The inferior vena cava is normal in size with greater than 50% respiratory variability, suggesting right atrial pressure of 3 mmHg. IAS/Shunts: No atrial level shunt detected by color flow Doppler.  LEFT VENTRICLE PLAX 2D LVIDd:         4.10 cm     Diastology LVIDs:         2.30 cm     LV e' medial:    8.81 cm/s LV PW:         0.90 cm     LV E/e' medial:  11.3 LV IVS:        0.90 cm     LV e' lateral:   7.18 cm/s LVOT diam:     2.40 cm     LV E/e' lateral: 13.9 LV SV:         147 LV SV Index:   88 LVOT Area:     4.52 cm?   LV Volumes (MOD) LV vol d, MOD A2C: 56.7 ml LV vol d, MOD A4C: 61.0 ml LV vol s, MOD A2C: 15.3 ml LV vol s, MOD A4C: 21.8 ml LV SV MOD A2C:     41.4 ml LV SV MOD A4C:     61.0 ml LV SV MOD BP:      39.8 ml RIGH

## 2021-09-04 NOTE — Progress Notes (Signed)
Initial Nutrition Assessment ? ?DOCUMENTATION CODES:  ?Not applicable ? ?INTERVENTION:  ?Advance diet to regular after procedure, encourage PO intake ?Ensure Enlive po BID, each supplement provides 350 kcal and 20 grams of protein. ?MVI with minerals daily ? ?NUTRITION DIAGNOSIS:  ?Inadequate oral intake related to decreased appetite as evidenced by per patient/family report. ? ?GOAL:  ?Patient will meet greater than or equal to 90% of their needs ? ?MONITOR:  ?PO intake, Supplement acceptance, Diet advancement, Labs ? ?REASON FOR ASSESSMENT:  ?Malnutrition Screening Tool ?  ? ?ASSESSMENT:  ?Pt with hx of diverticulosis, GERD, HTN, and vitamin D deficiency presented to ED from PCP office with elevated heart rate and respiratory symptoms. Pt found to have abnormal EKG and scheduled for catheterization. ? ?Pt out of room for cath procedure this AM, will follow up for nutrition hx and physical exam. Noted pt was agitated and paranoid last night.  ? ?Pt reported weight loss and poor appetite on admission screen. Noted that pt's spouse recently passed away which could be playing a role. No meals recorded in chart yet, but 4.8% weight loss noted over the last 6 months which is not severe for time frame.  ? ?Nutritionally Relevant Medications: ?Scheduled Meds: ? cycloSPORINE  1 drop Both Eyes q morning  ? doxycycline  100 mg Oral Q12H  ? feeding supplement  237 mL Oral BID BM  ? pantoprazole  40 mg Oral BID AC  ? predniSONE  20 mg Oral Q breakfast  ? rosuvastatin  5 mg Oral q morning  ? ?Continuous Infusions: ? sodium chloride 75 mL/hr at 09/04/21 1100  ? ?Labs Reviewed ? ?NUTRITION - FOCUSED PHYSICAL EXAM: ?Defer to in-person assessment ? ?Diet Order:   ?Diet Order   ? ?       ?  Diet Heart Room service appropriate? Yes; Fluid consistency: Thin  Diet effective now       ?  ? ?  ?  ? ?  ? ?EDUCATION NEEDS:  ?No education needs have been identified at this time ? ?Skin:  Skin Assessment: Reviewed RN Assessment ? ?Last BM:   5/3 ? ?Height:  ?Ht Readings from Last 1 Encounters:  ?09/03/21 5' 5.5" (1.664 m)  ? ? ?Weight:  ?Wt Readings from Last 1 Encounters:  ?09/04/21 61.3 kg  ? ? ?Ideal Body Weight:  59.1 kg ? ?BMI:  Body mass index is 22.15 kg/m?. ? ?Estimated Nutritional Needs:  ?Kcal:  1600-1800 kcal/d ?Protein:  80-90 g/d ?Fluid:  1.8-2L/d ? ? ?Ranell Patrick, RD, LDN ?Clinical Dietitian ?RD pager # available in [place]  ?After hours/weekend pager # available in [place] ?

## 2021-09-04 NOTE — TOC Benefit Eligibility Note (Signed)
Patient Advocate Encounter ? ?Insurance verification completed.   ? ?The patient is currently admitted and upon discharge could be taking Eliquis 5 mg. ? ?The current 30 day co-pay is, $37.00.  ? ?The patient is insured through [facility] Part D  ? ? ? ?Lyndel Safe, CPhT ?Pharmacy Patient Advocate Specialist ?[place] Patient Advocate Team ?Direct Number: [phone]  Fax: [phone] ? ? ? ? ? ?  ?

## 2021-09-04 NOTE — Progress Notes (Signed)
?   09/04/21 0135  ?Assess: MEWS Score  ?BP (!) 175/68  ?Pulse Rate 70  ?ECG Heart Rate 70  ?Resp (!) 28  ?SpO2 98 %  ?O2 Device Nasal Cannula  ?O2 Flow Rate (L/min) 2 L/min  ?Assess: MEWS Score  ?MEWS Temp 0  ?MEWS Systolic 0  ?MEWS Pulse 0  ?MEWS RR 2  ?MEWS LOC 0  ?MEWS Score 2  ?MEWS Score Color Yellow  ?Assess: if the MEWS score is Yellow or Red  ?Were vital signs taken at a resting state? Yes  ?Focused Assessment No change from prior assessment  ?Early Detection of Sepsis Score *See Row Information* Low  ?MEWS guidelines implemented *See Row Information* No, vital signs rechecked  ?Treat  ?MEWS Interventions Administered scheduled meds/treatments  ?Pain Scale 0-10  ?Pain Score Asleep  ?Notify: Charge Nurse/RN  ?Name of Charge Nurse/RN Notified Harshini RN  ?Date Charge Nurse/RN Notified 09/04/21  ?Time Charge Nurse/RN Notified [phone]  ? ? ? ? ?Patient repositioned and will reassess BP soon. Charge RN informed.  ?

## 2021-09-04 NOTE — Interval H&P Note (Signed)
Cath Lab Visit (complete for each Cath Lab visit) ? ?Clinical Evaluation Leading to the Procedure:  ? ?ACS: Yes.   ? ?Non-ACS:   ? ?Anginal Classification: CCS IV ? ?Anti-ischemic medical therapy: Minimal Therapy (1 class of medications) ? ?Non-Invasive Test Results: No non-invasive testing performed ? ?Prior CABG: No previous CABG ? ? ? ? ? ?History and Physical Interval Note: ? ?09/04/2021 ?9:48 AM ? ?Bianca Haley  has presented today for surgery, with the diagnosis of abnormal EKG.  The various methods of treatment have been discussed with the patient and family. After consideration of risks, benefits and other options for treatment, the patient has consented to  Procedure(s): ?LEFT HEART CATH AND CORONARY ANGIOGRAPHY (N/A) as a surgical intervention.  The patient's history has been reviewed, patient examined, no change in status, stable for surgery.  I have reviewed the patient's chart and labs.  Questions were answered to the patient's satisfaction.   ? ? ?Larae Grooms ? ? ?

## 2021-09-04 NOTE — Progress Notes (Signed)
CATH prep done.  ?

## 2021-09-04 NOTE — Progress Notes (Signed)
ANTICOAGULATION CONSULT NOTE ?Pharmacy Consult for Heparin ?Indication: atrial fibrillation ?Brief A/P: Heparin level subtherapeutic Increase Heparin rate ? ?Patient Measurements: ?Height: 5' 5.5" (166.4 cm) ?Weight: 61.3 kg (135 lb 2.3 oz) ?IBW/kg (Calculated) : 58.15 ?Heparin Dosing Weight: 62.1 kg ? ?Vital Signs: ?Temp: 98.2 ?F (36.8 ?C) (05/04 2007) ?Temp Source: Oral (05/04 2007) ?BP: 176/100 (05/04 2007) ?Pulse Rate: 81 (05/04 1627) ? ?Labs: ?Recent Labs  ?  09/02/21 ?1302 09/02/21 ?1302 09/02/21 ?2050 09/02/21 ?2230 09/03/21 ?0215 09/03/21 ?1347 09/04/21 ?9021 09/04/21 ?2227  ?HGB 14.5  --   --   --  11.8*  --  13.4  --   ?HCT 43.2  --   --   --  36.3  --  39.3  --   ?PLT 321  --   --   --  278  --  357  --   ?HEPARINUNFRC  --    < >  --   --  0.94* 0.50 0.19* 0.17*  ?CREATININE 1.42*  --   --   --  0.79  --   --   --   ?TROPONINIHS  --   --  173* 186*  --   --   --   --   ? < > = values in this interval not displayed.  ? ? ? ?Estimated Creatinine Clearance: 57.5 mL/min (by C-G formula based on SCr of 0.79 mg/dL). ?  ? ?Assessment: ?71 y.o. female with Afib for heparin ? ?Goal of Therapy:  ?Heparin level 0.3-0.7 units/ml ?Monitor platelets by anticoagulation protocol: Yes ?  ?Plan:  ?Increase Heparin 1000 units/hr ?Follow-up am labs. ? ?Phillis Knack, PharmD, BCPS ? ? ?

## 2021-09-04 NOTE — Progress Notes (Signed)
? ? ? [facility] ?                                                                            ? ? ?Bianca Haley, is a 71 y.o. female, DOB - 1950-06-17, CBU:[id number] ?Admit date - 09/02/2021    ?Outpatient Primary MD for the patient is Plotnikov, [name], MD ? ?LOS - 1  days ? ? ? ?Brief summary  ? ?Bianca Haley is a 72 y.o. female with medical history significant of chronic back pain and HTN who presented to her PCP today with URI symptoms and palpitations. She also reported sob. She was found ot be in afib with RVR and had elevated troponins.  ?Cardiology consulted and she underwent cath, non obstructive.  ? ?Assessment & Plan  ? ? ?Assessment and Plan: ? ?Paroxysmal atrial fibrillation ?Rate controlled with cardizem 240 mg daily and metoprolol 50 mg BID.  ?Cardiology consulted, .Echocardiogram showed preserved LVEF and grade 1 diastolic dysfunction. Right ventricular systolic pressure around 57.  ?  Her CHA2DS2-VASc score is 3. She was started on IV heparin for anticoagulation and transition to eliquis in am.  ? ? ?Elevated troponins probably secondary to A-fib with RVR and  URI symptoms ?Cardiology on board. Underwent cath which did not show any CAD.  ? ? ?Choking episode last night ?SLP evaluation ordered.  ?Recommendations given.  ? ? ?URI symptoms ?Patient's COVID 19 is negative, respiratory virus panel is negative. ?Symptomatic management with cough medication. ?Continue with doxycycline to complete a 5 days course.  ?Oral prednisone for 5 days.  ? ? ? ?Essential hypertension ?Blood pressure parameters are sub optimal.  ? ? ?Chronic pain syndrome ?Continue with home medications at this time. ? ? ?Hyperlipidemia ?Continue with Crestor. ? ? ?Acute kidney injury probably secondary to dehydration ?Resolved. Recheck renal parameters in am.  ? ? ?Estimated body mass index is 22.15 kg/m? as calculated from the following: ?  Height as of this encounter: 5' 5.5" (1.664 m). ?  Weight as of this  encounter: 61.3 kg. ? ?Code Status: full code.  ?DVT Prophylaxis:   ? ? ?Level of Care: Level of care: Telemetry Cardiac ?Family Communication: none at bedside.  ? ?Disposition Plan:     Remains inpatient appropriate: home when we can wean her off oxygen.  ? ?Procedures:  ?Echo ?Cardiac cath.  ? ?Consultants:   ?cardiology ? ?Antimicrobials:  ? ?Anti-infectives (From admission, onward)  ? ? Start     Dose/Rate Route Frequency Ordered Stop  ? 09/04/21 2000  doxycycline (VIBRA-TABS) tablet 100 mg       ? 100 mg Oral Every 12 hours 09/04/21 0825 09/07/21 2159  ? 09/02/21 1500  cefTRIAXone (ROCEPHIN) 1 g in sodium chloride 0.9 % 100 mL IVPB       ? 1 g ?200 mL/hr over 30 Minutes Intravenous  Once 09/02/21 1453 09/02/21 1538  ? 09/02/21 1500  doxycycline (VIBRAMYCIN) 100 mg in sodium chloride 0.9 % 250 mL IVPB  Status:  Discontinued       ? 100 mg ?125 mL/hr over 120 Minutes Intravenous Every 12 hours 09/02/21 1453 09/04/21 0825  ? ?  ? ? ? ?Medications ? ?Scheduled Meds: ?  aspirin EC  81 mg Oral Daily  ? cycloSPORINE  1 drop Both Eyes q morning  ? diltiazem  240 mg Oral q morning  ? doxycycline  100 mg Oral Q12H  ? feeding supplement  237 mL Oral BID BM  ? gabapentin  1,200 mg Oral QHS  ? guaiFENesin  600 mg Oral BID  ? irbesartan  150 mg Oral Daily  ? melatonin  10 mg Oral QHS  ? metoprolol tartrate  50 mg Oral BID  ? multivitamin with minerals  1 tablet Oral Daily  ? oxyCODONE  10 mg Oral TID  ? pantoprazole  40 mg Oral BID AC  ? PARoxetine  40 mg Oral BID  ? polyvinyl alcohol  1 drop Both Eyes QHS  ? predniSONE  20 mg Oral Q breakfast  ? rosuvastatin  5 mg Oral q morning  ? sodium chloride flush  10-40 mL Intracatheter Q12H  ? sodium chloride flush  3 mL Intravenous Q12H  ? sodium chloride flush  3 mL Intravenous Q12H  ? sodium chloride flush  3 mL Intravenous Q12H  ? traZODone  150 mg Oral QHS  ? ?Continuous Infusions: ? sodium chloride    ? heparin 850 Units/hr (09/04/21 1724)  ? ?PRN Meds:.sodium chloride,  acetaminophen **OR** acetaminophen, acetaminophen, benzonatate, diazepam, guaiFENesin-codeine, hydrALAZINE, metoprolol tartrate, sodium chloride flush, sodium chloride flush ? ? ? ?Subjective:  ? ?Aeriana Speece was seen and examined today.  Wants to go home. Still couging.  ?Objective:  ? ?Vitals:  ? 09/04/21 0832 09/04/21 0956 09/04/21 1202 09/04/21 1627  ?BP: (!) 159/98  (!) 190/100 (!) 188/100  ?Pulse: 80  69 81  ?Resp: 18  20 (!) 21  ?Temp: 98.1 ?F (36.7 ?C)  98.1 ?F (36.7 ?C) 98.1 ?F (36.7 ?C)  ?TempSrc: Oral  Oral Oral  ?SpO2: 93% 92% 91% 90%  ?Weight:      ?Height:      ? ? ?Intake/Output Summary (Last 24 hours) at 09/04/2021 1845 ?Last data filed at 09/04/2021 0716 ?Gross per 24 hour  ?Intake 613.37 ml  ?Output 400 ml  ?Net 213.37 ml  ? ?Filed Weights  ? 09/02/21 1248 09/03/21 1501 09/04/21 0444  ?Weight: 62.1 kg 61.3 kg 61.3 kg  ? ? ? ?Exam ?General exam: Appears calm and comfortable  ?Respiratory system: coarse breath sounds, no wheezing or rhonchi. On 1 lit of [place] oxygen.  ?Cardiovascular system: S1 & S2 heard, RRR. No JVD, No pedal edema. ?Gastrointestinal system: Abdomen is nondistended, soft and nontender. Normal bowel sounds heard. ?Central nervous system: Alert and oriented to place and person.  ?Extremities: Symmetric 5 x 5 power. ?Skin: No rashes, lesions or ulcers ?Psychiatry: restless.  ? ? ? ?Data Reviewed:  I have personally reviewed following labs and imaging studies ? ? ?CBC ?Lab Results  ?Component Value Date  ? WBC 11.2 (H) 09/04/2021  ? RBC 4.61 09/04/2021  ? HGB 13.4 09/04/2021  ? HCT 39.3 09/04/2021  ? MCV 85.2 09/04/2021  ? MCH 29.1 09/04/2021  ? PLT 357 09/04/2021  ? MCHC 34.1 09/04/2021  ? RDW 13.1 09/04/2021  ? LYMPHSABS 0.8 09/02/2021  ? MONOABS 1.0 09/02/2021  ? EOSABS 0.0 09/02/2021  ? BASOSABS 0.1 09/02/2021  ? ? ? ?Last metabolic panel ?Lab Results  ?Component Value Date  ? NA 138 09/03/2021  ? K 3.6 09/03/2021  ? CL 100 09/03/2021  ? CO2 27 09/03/2021  ? BUN 16 09/03/2021  ?  CREATININE 0.79 09/03/2021  ? GLUCOSE 99 09/03/2021  ?  GFRNONAA >60 09/03/2021  ? GFRAA >60 10/20/2018  ? CALCIUM 8.5 (L) 09/03/2021  ? PHOS 4.5 02/21/2013  ? PROT 7.4 09/02/2021  ? ALBUMIN 3.1 (L) 09/02/2021  ? LABGLOB 2.3 11/23/2017  ? AGRATIO 2.0 11/23/2017  ? BILITOT 1.2 09/02/2021  ? ALKPHOS 83 09/02/2021  ? AST 62 (H) 09/02/2021  ? ALT 30 09/02/2021  ? ANIONGAP 11 09/03/2021  ? ? ?CBG (last 3)  ?No results for input(s): GLUCAP in the last 72 hours.  ? ? ?Coagulation Profile: ?No results for input(s): INR, PROTIME in the last 168 hours. ? ? ?Radiology Studies: ?CARDIAC CATHETERIZATION ? ?Result Date: 09/04/2021 ?  The left ventricular systolic function is normal.   LV end diastolic pressure is normal.   The left ventricular ejection fraction is 55-65% by visual estimate.   There is no aortic valve stenosis. No angiographically apparent coronary artery disease. Continue medical therapy. Restart IV heparin for atrial fibrillation, 2 hours after TR band removed.  ? ?ECHOCARDIOGRAM COMPLETE ? ?Result Date: 09/03/2021 ?   ECHOCARDIOGRAM REPORT   Patient Name:   Bianca Haley Dy Date of Exam: 09/03/2021 Medical Rec #:  [id number]       Height:       65.0 in Accession #:    [id number]      Weight:       137.0 lb Date of Birth:  03/07/1951       BSA:          1.684 m? Patient Age:    29 years        BP:           144/59 mmHg Patient Gender: F               HR:           68 bpm. Exam Location:  Inpatient Procedure: 2D Echo, 3D Echo, Cardiac Doppler and Color Doppler Indications:    I35.1 Nonrheumatic aortic (valve) insufficiency  History:        Patient has prior history of Echocardiogram examinations, most                 recent 07/13/2018. Abnormal ECG, Aortic Valve Disease,                 Arrythmias:Atrial Fibrillation, Signs/Symptoms:Shortness of                 Breath and Dyspnea; Risk Factors:Hypertension and Dyslipidemia.                 ETOH.  Sonographer:    Roseanna Rainbow RDCS Referring Phys: [place] Comments: Exam delayed for occluded IV IMPRESSIONS  1. Left ventricular ejection fraction, by estimation, is 65 to 70%. The left ventricle has normal function. The left ventricle has no regional wall motion abnormaliti

## 2021-09-04 NOTE — Evaluation (Signed)
Physical Therapy Evaluation ?Patient Details ?Name: Bianca Haley ?MRN: [id number] ?DOB: [birth year] ?Today's Date: 09/04/2021 ? ?History of Present Illness ? 71 yo admitted 5/2 with SOB and palpitations from PCP office with AFib, respiratory infection and AMS with paranoia. PMhx: chronic back pain, HTN, endometriosis, GERD, Lt TKA, anxiety  ?Clinical Impression ? Pt attempting to get OOB on her own on arrival, not oriented to time and does not recall confusion from prior date. Pt on 2L on arrival with sats 98% and transitioned to RA with sats 94%. Pt lives alone but reports has a brother and sister in the area she can stay with. Pt with good mobility with noted cognitive deficits and will benefit from acute therapy to maximize mobility, safety and balance as well as family assist to address cognitive concerns.    ?  ?1L end of session 94% ?HR 92 NSR  ? ?Recommendations for follow up therapy are one component of a multi-disciplinary discharge planning process, led by the attending physician.  Recommendations may be updated based on patient status, additional functional criteria and insurance authorization. ? ?Follow Up Recommendations Home health PT ? ?  ?Assistance Recommended at Discharge Frequent or constant Supervision/Assistance  ?Patient can return home with the following ? A little help with walking and/or transfers;A little help with bathing/dressing/bathroom;Assistance with cooking/housework;Direct supervision/assist for financial management;Assist for transportation;Direct supervision/assist for medications management ? ?  ?Equipment Recommendations None recommended by PT  ?Recommendations for Other Services ?    ?  ?Functional Status Assessment Patient has had a recent decline in their functional status and demonstrates the ability to make significant improvements in function in a reasonable and predictable amount of time.  ? ?  ?Precautions / Restrictions Precautions ?Precautions: Fall ?Precaution Comments:  watch sats  ? ?  ? ?Mobility ? Bed Mobility ?Overal bed mobility: Needs Assistance ?Bed Mobility: Supine to Sit ?  ?  ?Supine to sit: Supervision, HOB elevated ?  ?  ?General bed mobility comments: HOB 20 degrees with supervision for lines and safety ?  ? ?Transfers ?Overall transfer level: Needs assistance ?  ?Transfers: Sit to/from Stand ?Sit to Stand: Min guard ?  ?  ?  ?  ?  ?General transfer comment: cues for hand placement and safety with assist for lines and mobility. Pt with assist for pericare as placing washcloth into urine ?  ? ?Ambulation/Gait ?Ambulation/Gait assistance: Min guard ?Gait Distance (Feet): 100 Feet ?Assistive device: None ?Gait Pattern/deviations: Step-through pattern, Decreased stride length ?  ?Gait velocity interpretation: 1.31 - 2.62 ft/sec, indicative of limited community ambulator ?  ?General Gait Details: pt with short step lengths, narrow BOS and no UE swing with gait. PT self-regulating distance and SpO2 87-94% on RA with cues for breathing technique ? ?Stairs ?  ?  ?  ?  ?  ? ?Wheelchair Mobility ?  ? ?Modified Rankin (Stroke Patients Only) ?  ? ?  ? ?Balance Overall balance assessment: Mild deficits observed, not formally tested ?  ?  ?  ?  ?  ?  ?  ?  ?  ?  ?  ?  ?  ?  ?  ?  ?  ?  ?   ? ? ? ?Pertinent Vitals/Pain Pain Assessment ?Pain Assessment: No/denies pain  ? ? ?Home Living Family/patient expects to be discharged to:: Private residence ?Living Arrangements: Alone ?Available Help at Discharge: Family ?Type of Home: Apartment ?Home Access: Elevator ?  ?  ?  ?Home Layout: One level ?Home  Equipment: Conservation officer, nature (2 wheels);Grab bars - tub/shower;Shower seat;Grab bars - toilet ?Additional Comments: uses CPAP only at night, reports no falls  ?  ?Prior Function Prior Level of Function : Independent/Modified Independent ?  ?  ?  ?  ?  ?  ?  ?ADLs Comments: pt states she drives and does all ADL, iADLs ?  ? ? ?Hand Dominance  ?   ? ?  ?Extremity/Trunk Assessment  ? Upper Extremity  Assessment ?Upper Extremity Assessment: Overall WFL for tasks assessed ?  ? ?Lower Extremity Assessment ?Lower Extremity Assessment: Overall WFL for tasks assessed ?  ? ?Cervical / Trunk Assessment ?Cervical / Trunk Assessment: Normal  ?Communication  ? Communication: No difficulties  ?Cognition Arousal/Alertness: Awake/alert ?Behavior During Therapy: Flat affect ?Overall Cognitive Status: Impaired/Different from baseline ?Area of Impairment: Orientation, Memory, Safety/judgement ?  ?  ?  ?  ?  ?  ?  ?  ?Orientation Level: Disoriented to, Time, Situation ?  ?Memory: Decreased short-term memory ?  ?Safety/Judgement: Decreased awareness of safety ?  ?  ?General Comments: pt with no recall of prior confusion, slow to respond with flat affect ?  ?  ? ?  ?General Comments   ? ?  ?Exercises    ? ?Assessment/Plan  ?  ?PT Assessment Patient needs continued PT services  ?PT Problem List Decreased mobility;Decreased activity tolerance;Decreased balance;Decreased cognition ? ?   ?  ?PT Treatment Interventions Gait training;Balance training;Functional mobility training;Therapeutic activities;Patient/family education;Cognitive remediation;DME instruction;Therapeutic exercise   ? ?PT Goals (Current goals can be found in the Care Plan section)  ?Acute Rehab PT Goals ?Patient Stated Goal: return home, travel ?PT Goal Formulation: With patient ?Time For Goal Achievement: 09/18/21 ?Potential to Achieve Goals: Good ? ?  ?Frequency Min 3X/week ?  ? ? ?Co-evaluation   ?  ?  ?  ?  ? ? ?  ?AM-PAC PT "6 Clicks" Mobility  ?Outcome Measure Help needed turning from your back to your side while in a flat bed without using bedrails?: None ?Help needed moving from lying on your back to sitting on the side of a flat bed without using bedrails?: A Little ?Help needed moving to and from a bed to a chair (including a wheelchair)?: A Little ?Help needed standing up from a chair using your arms (e.g., wheelchair or bedside chair)?: A Little ?Help  needed to walk in hospital room?: A Little ?Help needed climbing 3-5 steps with a railing? : A Little ?6 Click Score: 19 ? ?  ?End of Session   ?Activity Tolerance: Patient tolerated treatment well ?Patient left: in chair;with call bell/phone within reach;with chair alarm set ?Nurse Communication: Mobility status ?PT Visit Diagnosis: Other abnormalities of gait and mobility (R26.89);Difficulty in walking, not elsewhere classified (R26.2) ?  ? ?Time: 0109-3235 ?PT Time Calculation (min) (ACUTE ONLY): 20 min ? ? ?Charges:   PT Evaluation ?$PT Eval Moderate Complexity: 1 Mod ?  ?  ?   ? ? ?Bianca Haley P, PT ?Acute Rehabilitation Services ?Pager: [phone] ?Office: [phone] ? ? ?Kirsten Mckone B Nykerria Macconnell ?09/04/2021, 8:34 AM ? ?

## 2021-09-04 NOTE — Evaluation (Signed)
Occupational Therapy Evaluation ?Patient Details ?Name: Bianca Haley ?MRN: [id number] ?DOB: [birth year] ?Today's Date: 09/04/2021 ? ? ?History of Present Illness 71 yo admitted 5/2 with SOB and palpitations from PCP office with AFib, respiratory infection and AMS with paranoia. PMhx: chronic back pain, HTN, endometriosis, GERD, Lt TKA, anxiety  ? ?Clinical Impression ?  ?Mrs. Bianca Haley is a 71 year old woman with above medical history and s/p cardiac cath with restrictions for UE use. RN and chart reports patient very confused and paranoid during the night but on evaluation she is oriented to self, month, year, and place. When asked about the procedure she had she said something to the effect of "I had blood cloths washed out." Patient typically lives alone and is completely independent. Patient found to be using RUE for self feeding and despite education continued to use her arm. Overall she was supervision for bed mobility and min assist for ADLs due to RUE restriction.  Rn reports patient been getting up to [facility] frequently and not needing assistance  and ambulated in the hall with PT. From a physical standpoint patient appears to be doing well but there is some concern for her cognition. Do not expect she will have OT needs at discharge but will probably need near 24/7 assistance initially from family until cognition clears. She reports her sister could help her. Will follow acutely while in hospital.  ?   ? ?Recommendations for follow up therapy are one component of a multi-disciplinary discharge planning process, led by the attending physician.  Recommendations may be updated based on patient status, additional functional criteria and insurance authorization.  ? ?Follow Up Recommendations ? No OT follow up  ?  ?Assistance Recommended at Discharge Frequent or constant Supervision/Assistance  ?Patient can return home with the following Assistance with cooking/housework;Direct supervision/assist for medications  management;Direct supervision/assist for financial management;A little help with bathing/dressing/bathroom ? ?  ?Functional Status Assessment ? Patient has had a recent decline in their functional status and demonstrates the ability to make significant improvements in function in a reasonable and predictable amount of time.  ?Equipment Recommendations ? None recommended by OT  ?  ?Recommendations for Other Services   ? ? ?  ?Precautions / Restrictions Precautions ?Precautions: Fall ?Precaution Comments: watch sats, post cardiac cath restrictions =- no use of RUE for 24hrs then light use for 5 days  ? ?  ? ?Mobility Bed Mobility ?  ?  ?  ?  ?  ?  ?  ?  ?  ? ?Transfers ?  ?  ?  ?  ?  ?  ?  ?  ?  ?  ?  ? ?  ?Balance Overall balance assessment: Mild deficits observed, not formally tested ?  ?  ?  ?  ?  ?  ?  ?  ?  ?  ?  ?  ?  ?  ?  ?  ?  ?  ?   ? ?ADL either performed or assessed with clinical judgement  ? ?ADL Overall ADL's : Needs assistance/impaired ?Eating/Feeding: Set up ?  ?Grooming: Set up;Supervision/safety ?  ?Upper Body Bathing: Minimal assistance;Sitting ?  ?Lower Body Bathing: Minimal assistance;Sit to/from stand ?  ?Upper Body Dressing : Minimal assistance;Sitting ?  ?Lower Body Dressing: Minimal assistance;Sit to/from stand ?  ?Toilet Transfer: Min guard;BSC/3in1 ?  ?Toileting- Clothing Manipulation and Hygiene: Supervision/safety ?Toileting - Clothing Manipulation Details (indicate cue type and reason): RN reports patient performed toileting with supervision ?  ?  ?  Functional mobility during ADLs: Min guard ?General ADL Comments: Min guard to transfer in and out of bed and to stand but patient immediately put herself back to bed.  ? ? ? ?Vision Baseline Vision/History: 1 Wears glasses ?Patient Visual Report: No change from baseline ?   ?   ?Perception   ?  ?Praxis   ?  ? ?Pertinent Vitals/Pain Pain Assessment ?Pain Assessment: Faces ?Faces Pain Scale: Hurts a little bit ?Pain Location: R wrist ?Pain  Descriptors / Indicators: Grimacing ?Pain Intervention(s): Monitored during session  ? ? ? ?Hand Dominance Right ?  ?Extremity/Trunk Assessment Upper Extremity Assessment ?Upper Extremity Assessment: Overall WFL for tasks assessed (attempted to limit use of RUE though patient using it to transfer to out of bed and feed herself despite instructions not too) ?  ?Lower Extremity Assessment ?Lower Extremity Assessment: Defer to PT evaluation ?  ?Cervical / Trunk Assessment ?Cervical / Trunk Assessment: Normal ?  ?Communication Communication ?Communication: No difficulties ?  ?Cognition Arousal/Alertness: Awake/alert ?Behavior During Therapy: [facility] for tasks assessed/performed ?Overall Cognitive Status: Impaired/Different from baseline ?Area of Impairment: Orientation, Memory, Safety/judgement ?  ?  ?  ?  ?  ?  ?  ?  ?Orientation Level: Disoriented to, Situation ?  ?Memory: Decreased short-term memory, Decreased recall of precautions ?  ?Safety/Judgement: Decreased awareness of safety ?  ?  ?General Comments: Patient alert to self, knows she is in the hospiral and that it is [facility]. Able to tell me about her apartment. Knows the Month and Year. Wasn't able to tell me what her procedure was but said it was to "wash out blood clots" ?  ?  ?General Comments    ? ?  ?Exercises   ?  ?Shoulder Instructions    ? ? ?Home Living Family/patient expects to be discharged to:: Private residence ?Living Arrangements: Alone ?Available Help at Discharge: Family;Available PRN/intermittently ?Type of Home: Apartment ([address]) ?Home Access: Elevator ?  ?  ?Home Layout: One level ?  ?  ?Bathroom Shower/Tub: Walk-in shower ?  ?Bathroom Toilet: Standard ?  ?  ?Home Equipment: Rolling Walker (2 wheels);Grab bars - tub/shower;Shower seat;Grab bars - toilet ?  ?Additional Comments: uses CPAP only at night, reports no falls ?  ? ?  ?Prior Functioning/Environment Prior Level of Function : Independent/Modified Independent ?  ?  ?  ?  ?  ?  ?   ?ADLs Comments: pt states she drives and does all ADL, iADLs ?  ? ?  ?  ?OT Problem List: Decreased activity tolerance;Decreased cognition;Decreased safety awareness;Impaired UE functional use ?  ?   ?OT Treatment/Interventions: Self-care/ADL training;Patient/family education;DME and/or AE instruction;Therapeutic activities;Cognitive remediation/compensation;Balance training  ?  ?OT Goals(Current goals can be found in the care plan section) Acute Rehab OT Goals ?Patient Stated Goal: be independent ?OT Goal Formulation: With patient ?Time For Goal Achievement: 09/18/21 ?Potential to Achieve Goals: Good  ?OT Frequency: Min 2X/week ?  ? ?Co-evaluation   ?  ?  ?  ?  ? ?  ?AM-PAC OT "6 Clicks" Daily Activity     ?Outcome Measure Help from another person eating meals?: A Little ?Help from another person taking care of personal grooming?: A Little ?Help from another person toileting, which includes using toliet, bedpan, or urinal?: A Little ?Help from another person bathing (including washing, rinsing, drying)?: A Little ?Help from another person to put on and taking off regular upper body clothing?: A Little ?Help from another person to put on and taking off regular  lower body clothing?: A Little ?6 Click Score: 18 ?  ?End of Session Nurse Communication:  (okay to see) ? ?Activity Tolerance: Patient limited by fatigue ?Patient left: in bed ? ?OT Visit Diagnosis: Other symptoms and signs involving cognitive function  ?              ?Time: 5465-6812 ?OT Time Calculation (min): 12 min ?Charges:  OT General Charges ?$OT Visit: 1 Visit ?OT Evaluation ?$OT Eval Low Complexity: 1 Low ? ?Bianca Haley, OTR/L ?Acute Care Rehab Services  ?Office [phone] ?Pager: [phone]  ? ?Dellis Voght L Deontray Hunnicutt ?09/04/2021, 4:27 PM ?

## 2021-09-04 NOTE — Progress Notes (Signed)
Patient care delayed to promote rest and decrease agitation.  ? ? ? ?Will report off to day time RN. ?

## 2021-09-04 NOTE — H&P (View-Only) (Signed)
? ?Progress Note ? ?Patient Name: Bianca Haley ?Date of Encounter: 09/04/2021 ? ?[facility] Cardiologist: New ? ?Subjective  ? ?No CP; mild dyspnea; fatigue ? ?Inpatient Medications  ?  ?Scheduled Meds: ? aspirin EC  81 mg Oral Daily  ? cycloSPORINE  1 drop Both Eyes q morning  ? diltiazem  240 mg Oral q morning  ? doxycycline  100 mg Oral Q12H  ? feeding supplement  237 mL Oral BID BM  ? gabapentin  1,200 mg Oral QHS  ? guaiFENesin  600 mg Oral BID  ? melatonin  10 mg Oral QHS  ? metoprolol tartrate  50 mg Oral BID  ? oxyCODONE  10 mg Oral TID  ? pantoprazole  40 mg Oral BID AC  ? PARoxetine  40 mg Oral BID  ? polyvinyl alcohol  1 drop Both Eyes QHS  ? predniSONE  20 mg Oral Q breakfast  ? rosuvastatin  5 mg Oral q morning  ? sodium chloride flush  10-40 mL Intracatheter Q12H  ? sodium chloride flush  3 mL Intravenous Q12H  ? sodium chloride flush  3 mL Intravenous Q12H  ? traZODone  150 mg Oral QHS  ? ?Continuous Infusions: ? sodium chloride    ? sodium chloride 1 mL/kg/hr (09/04/21 0649)  ? heparin 750 Units/hr (09/04/21 0659)  ? ?PRN Meds: ?sodium chloride, acetaminophen **OR** acetaminophen, diazepam, guaiFENesin-codeine, hydrALAZINE, metoprolol tartrate, sodium chloride flush, sodium chloride flush  ? ?Vital Signs  ?  ?Vitals:  ? 09/04/21 0200 09/04/21 0444 09/04/21 0655 09/04/21 0819  ?BP: 135/68  (!) 149/76   ?Pulse: 67  78 82  ?Resp: 20  18   ?Temp:   98.3 ?F (36.8 ?C)   ?TempSrc:   Oral   ?SpO2: 97%  96% 90%  ?Weight:  61.3 kg    ?Height:      ? ? ?Intake/Output Summary (Last 24 hours) at 09/04/2021 0828 ?Last data filed at 09/04/2021 0716 ?Gross per 24 hour  ?Intake 1296.08 ml  ?Output 400 ml  ?Net 896.08 ml  ? ? ? ?  09/04/2021  ?  4:44 AM 09/03/2021  ?  3:01 PM 09/02/2021  ? 12:48 PM  ?Last 3 Weights  ?Weight (lbs) 135 lb 2.3 oz 135 lb 3.2 oz 137 lb  ?Weight (kg) 61.3 kg 61.326 kg 62.143 kg  ?   ? ?Telemetry  ?  ?Sinus - Personally Reviewed ? ?ECG  ?  ?Normal sinus rhythm, marked anterior T wave inversion,  prolonged QT interval, left ventricular hypertrophy.- Personally Reviewed ? ?Physical Exam  ? ?GEN: No acute distress.   ?Neck: No JVD ?Cardiac: RRR, no murmurs, rubs, or gallops.  ?Respiratory: Diminished BS ?GI: Soft, nontender, non-distended  ?MS: No edema ?Neuro:  Nonfocal  ?Psych: Normal affect  ? ?Labs  ?  ?High Sensitivity Troponin:   ?Recent Labs  ?Lab 09/02/21 ?2050 09/02/21 ?2230  ?TROPONINIHS 173* 186*  ? ?   ?Chemistry ?Recent Labs  ?Lab 09/02/21 ?1302 09/03/21 ?0215  ?NA 138 138  ?K 3.0* 3.6  ?CL 98 100  ?CO2 22 27  ?GLUCOSE 132* 99  ?BUN 30* 16  ?CREATININE 1.42* 0.79  ?CALCIUM 9.3 8.5*  ?MG 2.2  --   ?PROT 7.4  --   ?ALBUMIN 3.1*  --   ?AST 62*  --   ?ALT 30  --   ?ALKPHOS 83  --   ?BILITOT 1.2  --   ?GFRNONAA 39* >60  ?ANIONGAP 18* 11  ? ?  ? ?  Hematology ?Recent Labs  ?Lab 09/02/21 ?1302 09/03/21 ?0215 09/04/21 ?7124  ?WBC 11.6* 8.6 11.2*  ?RBC 5.02 4.19 4.61  ?HGB 14.5 11.8* 13.4  ?HCT 43.2 36.3 39.3  ?MCV 86.1 86.6 85.2  ?MCH 28.9 28.2 29.1  ?MCHC 33.6 32.5 34.1  ?RDW 13.1 13.2 13.1  ?PLT 321 278 357  ? ? ? ?Radiology  ?  ?DG Chest 2 View ? ?Result Date: 09/02/2021 ?CLINICAL DATA:  Cough. EXAM: CHEST - 2 VIEW COMPARISON:  March 09, 2021. FINDINGS: Stable cardiomediastinal silhouette. Both lungs are clear. The visualized skeletal structures are unremarkable. IMPRESSION: No active cardiopulmonary disease. Electronically Signed   By: Marijo Conception M.D.   On: 09/02/2021 14:42  ? ?ECHOCARDIOGRAM COMPLETE ? ?Result Date: 09/03/2021 ?   ECHOCARDIOGRAM REPORT   Patient Name:   Bianca Haley Date of Exam: 09/03/2021 Medical Rec #:  [id number]       Height:       65.0 in Accession #:    [id number]      Weight:       137.0 lb Date of Birth:  [birth year]       BSA:          1.684 m? Patient Age:    71 years        BP:           144/59 mmHg Patient Gender: F               HR:           68 bpm. Exam Location:  Inpatient Procedure: 2D Echo, 3D Echo, Cardiac Doppler and Color Doppler Indications:    I35.1  Nonrheumatic aortic (valve) insufficiency  History:        Patient has prior history of Echocardiogram examinations, most                 recent 07/13/2018. Abnormal ECG, Aortic Valve Disease,                 Arrythmias:Atrial Fibrillation, Signs/Symptoms:Shortness of                 Breath and Dyspnea; Risk Factors:Hypertension and Dyslipidemia.                 ETOH.  Sonographer:    Roseanna Rainbow RDCS Referring Phys: [place] Comments: Exam delayed for occluded IV IMPRESSIONS  1. Left ventricular ejection fraction, by estimation, is 65 to 70%. The left ventricle has normal function. The left ventricle has no regional wall motion abnormalities. Left ventricular diastolic parameters are consistent with Grade I diastolic dysfunction (impaired relaxation).  2. Right ventricular systolic function is normal. The right ventricular size is normal. There is moderately elevated pulmonary artery systolic pressure. The estimated right ventricular systolic pressure is 41.9 mmHg.  3. Left atrial size was mildly dilated.  4. The mitral valve is degenerative. Moderate mitral valve regurgitation. No evidence of mitral stenosis.  5. Tricuspid valve regurgitation is mild to moderate.  6. The aortic valve is normal in structure. Aortic valve regurgitation is moderate. No aortic stenosis is present.  7. The inferior vena cava is normal in size with greater than 50% respiratory variability, suggesting right atrial pressure of 3 mmHg. Comparison(s): Mildly increased aortic regurgitiation and mitral regurgitation when compared to prior. FINDINGS  Left Ventricle: Left ventricular ejection fraction, by estimation, is 65 to 70%. The left ventricle has normal function. The left ventricle has no regional wall motion abnormalities.  The left ventricular internal cavity size was normal in size. There is  no left ventricular hypertrophy. Left ventricular diastolic parameters are consistent with Grade I diastolic dysfunction  (impaired relaxation). Right Ventricle: The right ventricular size is normal. No increase in right ventricular wall thickness. Right ventricular systolic function is normal. There is moderately elevated pulmonary artery systolic pressure. The tricuspid regurgitant velocity is 3.26 m/s, and with an assumed right atrial pressure of 15 mmHg, the estimated right ventricular systolic pressure is 78.6 mmHg. Left Atrium: Left atrial size was mildly dilated. Right Atrium: Right atrial size was normal in size. Pericardium: There is no evidence of pericardial effusion. Mitral Valve: The mitral valve is degenerative in appearance. There is moderate thickening of the mitral valve leaflet(s). There is moderate calcification of the mitral valve leaflet(s). Moderate mitral valve regurgitation. No evidence of mitral valve stenosis. MV peak gradient, 7.1 mmHg. The mean mitral valve gradient is 3.0 mmHg. Tricuspid Valve: The tricuspid valve is normal in structure. Tricuspid valve regurgitation is mild to moderate. No evidence of tricuspid stenosis. Aortic Valve: The aortic valve is normal in structure. Aortic valve regurgitation is moderate. Aortic regurgitation PHT measures 412 msec. No aortic stenosis is present. Pulmonic Valve: The pulmonic valve was normal in structure. Pulmonic valve regurgitation is not visualized. No evidence of pulmonic stenosis. Aorta: The aortic root is normal in size and structure. Venous: The inferior vena cava is normal in size with greater than 50% respiratory variability, suggesting right atrial pressure of 3 mmHg. IAS/Shunts: No atrial level shunt detected by color flow Doppler.  LEFT VENTRICLE PLAX 2D LVIDd:         4.10 cm     Diastology LVIDs:         2.30 cm     LV e' medial:    8.81 cm/s LV PW:         0.90 cm     LV E/e' medial:  11.3 LV IVS:        0.90 cm     LV e' lateral:   7.18 cm/s LVOT diam:     2.40 cm     LV E/e' lateral: 13.9 LV SV:         147 LV SV Index:   88 LVOT Area:     4.52 cm?   LV Volumes (MOD) LV vol d, MOD A2C: 56.7 ml LV vol d, MOD A4C: 61.0 ml LV vol s, MOD A2C: 15.3 ml LV vol s, MOD A4C: 21.8 ml LV SV MOD A2C:     41.4 ml LV SV MOD A4C:     61.0 ml LV SV MOD BP:      39.8 ml RIGH

## 2021-09-04 NOTE — Progress Notes (Signed)
OT Cancellation Note ? ?Patient Details ?Name: Bianca Haley ?MRN: [id number] ?DOB: October 31, 1950 ? ? ?Cancelled Treatment:    Reason Eval/Treat Not Completed: Patient at procedure or test/ unavailable. Patient off the floor for heart cath. Will f/u as able. ? ?Lenward Chancellor ?09/04/2021, 12:15 PM ?

## 2021-09-04 NOTE — Progress Notes (Addendum)
ANTICOAGULATION CONSULT NOTE ? ?Pharmacy Consult for Heparin ?Indication: atrial fibrillation ? ?Patient Measurements: ?Height: 5' 5.5" (166.4 cm) ?Weight: 61.3 kg (135 lb 2.3 oz) ?IBW/kg (Calculated) : 58.15 ?Heparin Dosing Weight: 62.1 kg ? ?Vital Signs: ?Temp: 98.3 ?F (36.8 ?C) (05/04 3300) ?Temp Source: Oral (05/04 7622) ?BP: 149/76 (05/04 6333) ?Pulse Rate: 78 (05/04 0655) ? ?Labs: ?Recent Labs  ?  09/02/21 ?1302 09/02/21 ?2050 09/02/21 ?2230 09/03/21 ?0215 09/03/21 ?1347 09/04/21 ?5456  ?HGB 14.5  --   --  11.8*  --  13.4  ?HCT 43.2  --   --  36.3  --  39.3  ?PLT 321  --   --  278  --  357  ?HEPARINUNFRC  --   --   --  0.94* 0.50 0.19*  ?CREATININE 1.42*  --   --  0.79  --   --   ?TROPONINIHS  --  173* 186*  --   --   --   ? ? ?Estimated Creatinine Clearance: 57.5 mL/min (by C-G formula based on SCr of 0.79 mg/dL). ?  ? ?Assessment: ?71 yo F presenting with paroxysmal atrial fibrillation, not on anticoagulation PTA. Pharmacy consulted for heparin dosing.  ? ?Heparin level 0.19 is subtherapeutic on 750 units/hr.  No issues with infusion or bleeding per RN. H/H, plt stable. ? ?Goal of Therapy:  ?Heparin level 0.3-0.7 units/ml ?Monitor platelets by anticoagulation protocol: Yes ?  ?Plan:  ?Increase heparin gtt to 850 units/hr ?F/u 8hr level ?Daily heparin level and CBC ?F/u long-term AC plans ? ?Addendum 12:15: s/p heart cath, no CAD found. OK to restart heparin for afib 2hr after TR band removal. TR band removed at 13:50 per PA. Restart heparin at 850 units/hr at 16:00, check level in 8hr.  ? ? ?Benetta Spar, PharmD, BCPS, BCCP ?Clinical Pharmacist ? ?Please check AMION for all [place] phone numbers ?After 10:00 PM, call [place] [phone] ? ?

## 2021-09-04 NOTE — Discharge Instructions (Addendum)
Information on my medicine - ELIQUIS (apixaban)  This medication education was reviewed with me or my healthcare representative as part of my discharge preparation.     Why was Eliquis prescribed for you? Eliquis was prescribed for you to reduce the risk of a blood clot forming that can cause a stroke if you have a medical condition called atrial fibrillation (a type of irregular heartbeat).  What do You need to know about Eliquis ? Take your Eliquis TWICE DAILY - one tablet in the morning and one tablet in the evening with or without food. If you have difficulty swallowing the tablet whole please discuss with your pharmacist how to take the medication safely.  Take Eliquis exactly as prescribed by your doctor and DO NOT stop taking Eliquis without talking to the doctor who prescribed the medication.  Stopping may increase your risk of developing a stroke.  Refill your prescription before you run out.  After discharge, you should have regular check-up appointments with your healthcare provider that is prescribing your Eliquis.  In the future your dose may need to be changed if your kidney function or weight changes by a significant amount or as you get older.  What do you do if you miss a dose? If you miss a dose, take it as soon as you remember on the same day and resume taking twice daily.  Do not take more than one dose of ELIQUIS at the same time to make up a missed dose.  Important Safety Information A possible side effect of Eliquis is bleeding. You should call your healthcare provider right away if you experience any of the following: Bleeding from an injury or your nose that does not stop. Unusual colored urine (red or dark brown) or unusual colored stools (red or black). Unusual bruising for unknown reasons. A serious fall or if you hit your head (even if there is no bleeding).  Some medicines may interact with Eliquis and might increase your risk of bleeding or clotting  while on Eliquis. To help avoid this, consult your healthcare provider or pharmacist prior to using any new prescription or non-prescription medications, including herbals, vitamins, non-steroidal anti-inflammatory drugs (NSAIDs) and supplements.  This website has more information on Eliquis (apixaban): http://www.eliquis.com/eliquis/home =======================================  Atrial Fibrillation    Atrial fibrillation is a type of heartbeat that is irregular or fast. If you have this condition, your heart beats without any order. This makes it hard for your heart to pump blood in a normal way. Atrial fibrillation may come and go, or it may become a long-lasting problem. If this condition is not treated, it can put you at higher risk for stroke, heart failure, and other heart problems.  What are the causes? This condition may be caused by diseases that damage the heart. They include: High blood pressure. Heart failure. Heart valve disease. Heart surgery. Other causes include: Diabetes. Thyroid disease. Being overweight. Kidney disease. Sometimes the cause is not known.  What increases the risk? You are more likely to develop this condition if: You are older. You smoke. You exercise often and very hard. You have a family history of this condition. You are a man. You use drugs. You drink a lot of alcohol. You have lung conditions, such as emphysema, pneumonia, or COPD. You have sleep apnea.  What are the signs or symptoms? Common symptoms of this condition include: A feeling that your heart is beating very fast. Chest pain or discomfort. Feeling short of breath. Suddenly feeling   light-headed or weak. Getting tired easily during activity. Fainting. Sweating. In some cases, there are no symptoms.  How is this treated? Treatment for this condition depends on underlying conditions and how you feel when you have atrial fibrillation. They include: Medicines to: Prevent  blood clots. Treat heart rate or heart rhythm problems. Using devices, such as a pacemaker, to correct heart rhythm problems. Doing surgery to remove the part of the heart that sends bad signals. Closing an area where clots can form in the heart (left atrial appendage). In some cases, your doctor will treat other underlying conditions.  Follow these instructions at home:  Medicines Take over-the-counter and prescription medicines only as told by your doctor. Do not take any new medicines without first talking to your doctor. If you are taking blood thinners: Talk with your doctor before you take any medicines that have aspirin or NSAIDs, such as ibuprofen, in them. Take your medicine exactly as told by your doctor. Take it at the same time each day. Avoid activities that could hurt or bruise you. Follow instructions about how to prevent falls. Wear a bracelet that says you are taking blood thinners. Or, carry a card that lists what medicines you take. Lifestyle         Do not use any products that have nicotine or tobacco in them. These include cigarettes, e-cigarettes, and chewing tobacco. If you need help quitting, ask your doctor. Eat heart-healthy foods. Talk with your doctor about the right eating plan for you. Exercise regularly as told by your doctor. Do not drink alcohol. Lose weight if you are overweight. Do not use drugs, including cannabis.  General instructions If you have a condition that causes breathing to stop for a short period of time (apnea), treat it as told by your doctor. Keep a healthy weight. Do not use diet pills unless your doctor says they are safe for you. Diet pills may make heart problems worse. Keep all follow-up visits as told by your doctor. This is important.  Contact a doctor if: You notice a change in the speed, rhythm, or strength of your heartbeat. You are taking a blood-thinning medicine and you get more bruising. You get tired more easily  when you move or exercise. You have a sudden change in weight.  Get help right away if:    You have pain in your chest or your belly (abdomen). You have trouble breathing. You have side effects of blood thinners, such as blood in your vomit, poop (stool), or pee (urine), or bleeding that cannot stop. You have any signs of a stroke. "BE FAST" is an easy way to remember the main warning signs: B - Balance. Signs are dizziness, sudden trouble walking, or loss of balance. E - Eyes. Signs are trouble seeing or a change in how you see. F - Face. Signs are sudden weakness or loss of feeling in the face, or the face or eyelid drooping on one side. A - Arms. Signs are weakness or loss of feeling in an arm. This happens suddenly and usually on one side of the body. S - Speech. Signs are sudden trouble speaking, slurred speech, or trouble understanding what people say. T - Time. Time to call emergency services. Write down what time symptoms started. You have other signs of a stroke, such as: A sudden, very bad headache with no known cause. Feeling like you may vomit (nausea). Vomiting. A seizure.  These symptoms may be an emergency. Do not wait to   see if the symptoms will go away. Get medical help right away. Call your local emergency services (911 in the [place] Do not drive yourself to the hospital. Summary Atrial fibrillation is a type of heartbeat that is irregular or fast. You are at higher risk of this condition if you smoke, are older, have diabetes, or are overweight. Follow your doctor's instructions about medicines, diet, exercise, and follow-up visits. Get help right away if you have signs or symptoms of a stroke. Get help right away if you cannot catch your breath, or you have chest pain or discomfort. This information is not intended to replace advice given to you by your health care provider. Make sure you discuss any questions you have with your health care provider. Document  Revised: 10/12/2018 Document Reviewed: 10/12/2018 Elsevier Patient Education  2020 Elsevier Inc.    

## 2021-09-04 NOTE — Progress Notes (Signed)
SLP Cancellation Note ? ?Patient Details ?Name: Bianca Haley ?MRN: [id number] ?DOB: 05-23-1950 ? ? ?Cancelled treatment:        Pt presently NPO for procedure. Will continue efforts.  ? ? ?Houston Siren ?09/04/2021, 9:26 AM ?

## 2021-09-04 NOTE — Evaluation (Signed)
Clinical/Bedside Swallow Evaluation ?Patient Details  ?Name: Bianca Haley ?MRN: [id number] ?Date of Birth: June 30, 1950 ? ?Today's Date: 09/04/2021 ?Time: SLP Start Time (ACUTE ONLY): 3220 SLP Stop Time (ACUTE ONLY): 2542 ?SLP Time Calculation (min) (ACUTE ONLY): 13 min ? ?Past Medical History:  ?Past Medical History:  ?Diagnosis Date  ? Anemia   ? history of anemia  ? Anxiety   ? Arthritis   ? Chronic low back pain   ? Chronic renal failure   ? patient denies   ? Diverticulosis   ? Dupuytren contracture   ? Right hand, has received injection  ? GERD (gastroesophageal reflux disease)   ? H/O tinnitus   ? left  ? Hearing loss   ? 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  ? Hemorrhoids   ? History of colon polyps   ? History of endometriosis   ? and fibroids  ? History of hiatal hernia   ? History of UTI   ? Hypertension   ? Memory loss   ? Migraine   ? OSA (obstructive sleep apnea)   ? on nocturnal O2  ? Recurrent pneumonia 07/2018  ? Seasonal allergies   ? Vitamin D deficiency   ? Wears glasses   ? ?Past Surgical History:  ?Past Surgical History:  ?Procedure Laterality Date  ? ABDOMINAL HYSTERECTOMY    ? BACK SURGERY    ? BIOPSY  10/20/2018  ? Procedure: BIOPSY;  Surgeon: [name], MD;  Location: [facility];  Service: Endoscopy;;  ? BLADDER SUSPENSION    ? COLONOSCOPY  02/16/2008  ? normal  ? DENTAL SURGERY    ? ESOPHAGOGASTRODUODENOSCOPY (EGD) WITH PROPOFOL N/A 10/20/2018  ? Procedure: ESOPHAGOGASTRODUODENOSCOPY (EGD) WITH PROPOFOL;  Surgeon: [name], MD;  Location: [facility];  Service: Endoscopy;  Laterality: N/A;  ? EUS N/A 10/20/2018  ? Procedure: UPPER ENDOSCOPIC ULTRASOUND (EUS) RADIAL;  Surgeon: [name], MD;  Location: [facility];  Service: Endoscopy;  Laterality: N/A;  ? FOOT SURGERY Left   ? x3  ? laparoscopy    ? for evaluation of endometriosis  ? LEFT HEART CATH AND CORONARY ANGIOGRAPHY N/A 09/04/2021  ? Procedure: LEFT HEART CATH AND CORONARY ANGIOGRAPHY;  Surgeon: Jettie Booze, MD;   Location: [facility];  Service: Cardiovascular;  Laterality: N/A;  ? TOTAL KNEE ARTHROPLASTY Left 07/15/2015  ? Procedure: TOTAL LEFT KNEE ARTHROPLASTY;  Surgeon: Paralee Cancel, MD;  Location: [facility];  Service: Orthopedics;  Laterality: Left;  ? UPPER GI ENDOSCOPY    ? ?HPI:  ?71 yo admitted 5/2 with SOB and palpitations from PCP office with AFib, respiratory infection and AMS with paranoia. PMhx: chronic back pain, HTN, endometriosis, GERD, Lt TKA, anxiety  ?  ?Assessment / Plan / Recommendation  ?Clinical Impression ? Pt stated yesterday she had a globus pharyngeal sensation while eating a sandwich that is unusual for her. Today there were 2 throat clears that were not concerning for airway intrusion. Therapist suspects pt may have esophageal involvement. She stated she had a sore throat and SLP noted a slightly hoarse vocal quality in line with her diagnosis of URI. Therapist educated pt re: reflux precautions and if symptoms persist after discharge she could pusue GI consult if needed. Continue regular/thin liquids and no further ST needed. ?SLP Visit Diagnosis: Dysphagia, unspecified (R13.10) ?   ?Aspiration Risk ? No limitations  ?  ?Diet Recommendation Regular;Thin liquid  ? ?Liquid Administration via: Cup;Straw ?Medication Administration: Whole meds with liquid ?Supervision: Patient able to  self feed ?Compensations: Slow rate;Small sips/bites ?Postural Changes: Seated upright at 90 degrees;Remain upright for at least 30 minutes after po intake  ?  ?Other  Recommendations Oral Care Recommendations: Oral care BID   ? ?Recommendations for follow up therapy are one component of a multi-disciplinary discharge planning process, led by the attending physician.  Recommendations may be updated based on patient status, additional functional criteria and insurance authorization. ? ?Follow up Recommendations No SLP follow up  ? ? ?  ?Assistance Recommended at Discharge None  ?Functional Status Assessment     ?Frequency and Duration    ?  ?  ?   ? ?Prognosis    ? ?  ? ?Swallow Study   ?General Date of Onset: 09/03/21 ?HPI: 71 yo admitted 5/2 with SOB and palpitations from PCP office with AFib, respiratory infection and AMS with paranoia. PMhx: chronic back pain, HTN, endometriosis, GERD, Lt TKA, anxiety ?Type of Study: Bedside Swallow Evaluation ?Previous Swallow Assessment:  (none) ?Diet Prior to this Study: Regular;Thin liquids ?Temperature Spikes Noted: No ?Respiratory Status: Nasal cannula ?History of Recent Intubation: No ?Behavior/Cognition: Alert;Cooperative;Pleasant mood ?Oral Cavity Assessment: Within Functional Limits ?Oral Care Completed by SLP: No ?Oral Cavity - Dentition: Adequate natural dentition ?Vision: Functional for self-feeding ?Self-Feeding Abilities: Able to feed self ?Patient Positioning: Upright in bed ?Baseline Vocal Quality: Normal ?Volitional Cough: Strong ?Volitional Swallow: Able to elicit  ?  ?Oral/Motor/Sensory Function Overall Oral Motor/Sensory Function: Within functional limits   ?Ice Chips Ice chips: Not tested   ?Thin Liquid Thin Liquid: Within functional limits ?Presentation: Straw  ?  ?Nectar Thick Nectar Thick Liquid: Not tested   ?Honey Thick Honey Thick Liquid: Not tested   ?Puree Puree: Not tested   ?Solid ? ? ?  Solid: Within functional limits  ? ?  ? ?Houston Siren ?09/04/2021,2:15 PM ? ? ? ?

## 2021-09-05 ENCOUNTER — Inpatient Hospital Stay (HOSPITAL_COMMUNITY): Payer: Medicare Other

## 2021-09-05 DIAGNOSIS — I480 Paroxysmal atrial fibrillation: Secondary | ICD-10-CM | POA: Diagnosis not present

## 2021-09-05 DIAGNOSIS — J9601 Acute respiratory failure with hypoxia: Secondary | ICD-10-CM | POA: Diagnosis not present

## 2021-09-05 DIAGNOSIS — F1120 Opioid dependence, uncomplicated: Secondary | ICD-10-CM | POA: Diagnosis not present

## 2021-09-05 DIAGNOSIS — J069 Acute upper respiratory infection, unspecified: Secondary | ICD-10-CM | POA: Diagnosis not present

## 2021-09-05 DIAGNOSIS — R778 Other specified abnormalities of plasma proteins: Secondary | ICD-10-CM | POA: Diagnosis not present

## 2021-09-05 DIAGNOSIS — N179 Acute kidney failure, unspecified: Secondary | ICD-10-CM | POA: Diagnosis not present

## 2021-09-05 DIAGNOSIS — I10 Essential (primary) hypertension: Secondary | ICD-10-CM | POA: Diagnosis not present

## 2021-09-05 LAB — BASIC METABOLIC PANEL
Anion gap: 11 (ref 5–15)
BUN: 12 mg/dL (ref 8–23)
CO2: 29 mmol/L (ref 22–32)
Calcium: 8.9 mg/dL (ref 8.9–10.3)
Chloride: 100 mmol/L (ref 98–111)
Creatinine, Ser: 0.72 mg/dL (ref 0.44–1.00)
GFR, Estimated: >60 mL/min (ref >60)
Glucose, Bld: 122 mg/dL — ABNORMAL HIGH (ref 70–99)
Potassium: 3.2 mmol/L — ABNORMAL LOW (ref 3.5–5.1)
Sodium: 140 mmol/L (ref 135–145)

## 2021-09-05 LAB — HEPARIN LEVEL (UNFRACTIONATED): Heparin Unfractionated: 0.38 IU/mL (ref 0.30–0.70)

## 2021-09-05 LAB — T4, FREE: Free T4: 1.91 ng/dL — ABNORMAL HIGH (ref 0.61–1.12)

## 2021-09-05 LAB — CBC
HCT: 38.1 % (ref 36.0–46.0)
Hemoglobin: 12.8 g/dL (ref 12.0–15.0)
MCH: 28.7 pg (ref 26.0–34.0)
MCHC: 33.6 g/dL (ref 30.0–36.0)
MCV: 85.4 fL (ref 80.0–100.0)
Platelets: 345 10*3/uL (ref 150–400)
RBC: 4.46 MIL/uL (ref 3.87–5.11)
RDW: 13.2 % (ref 11.5–15.5)
WBC: 9.8 10*3/uL (ref 4.0–10.5)
nRBC: 0 % (ref 0.0–0.2)

## 2021-09-05 MED ORDER — POTASSIUM CHLORIDE CRYS ER 20 MEQ PO TBCR
40.0000 meq | EXTENDED_RELEASE_TABLET | Freq: Once | ORAL | Status: AC
  Administered 2021-09-05: 40 meq via ORAL
  Filled 2021-09-05: qty 2

## 2021-09-05 MED ORDER — APIXABAN 5 MG PO TABS
5.0000 mg | ORAL_TABLET | Freq: Two times a day (BID) | ORAL | Status: DC
  Administered 2021-09-05 – 2021-09-06 (×3): 5 mg via ORAL
  Filled 2021-09-05 (×3): qty 1

## 2021-09-05 MED ORDER — CARBAMIDE PEROXIDE 6.5 % OT SOLN
5.0000 [drp] | Freq: Two times a day (BID) | OTIC | Status: DC
  Administered 2021-09-05 – 2021-09-06 (×2): 5 [drp] via OTIC
  Filled 2021-09-05: qty 15

## 2021-09-05 MED FILL — Lidocaine HCl Local Preservative Free (PF) Inj 1%: INTRAMUSCULAR | Qty: 30 | Status: AC

## 2021-09-05 NOTE — Progress Notes (Signed)
Mobility Specialist Progress Note ? ? 09/05/21 1745  ?Mobility  ?Activity Ambulated independently in hallway  ?Level of Assistance Standby assist, set-up cues, supervision of patient - no hands on  ?Assistive Device Front wheel walker  ?Distance Ambulated (ft) 252 ft  ?Activity Response Tolerated well  ?$Mobility charge 1 Mobility  ? ?Pre Mobility: 69 HR, 94% SpO2 ?During Mobility: 85 HR, BP, 91% SpO2 ?Post Mobility: 77 HR, BP, 91% SpO2 ? ?Received in bed w/ no complaints and agreeable. SpO2 ranged from 86% - 94% throughout ambulation but no dyspnea or SOB recorded during. 86% SpO2 was only present momentarily but pt consistently stayed around 90% - 91%. Returned to bed and placed call bell in reach and met all needs ? ?Holland Falling ?Mobility Specialist ?Phone Number [phone] ? ?

## 2021-09-05 NOTE — Plan of Care (Signed)
?  Problem: Activity: ?Goal: Ability to tolerate increased activity will improve ?Outcome: Progressing ?  ?Problem: Clinical Measurements: ?Goal: Ability to maintain a body temperature in the normal range will improve ?Outcome: Progressing ?  ?Problem: Respiratory: ?Goal: Ability to maintain adequate ventilation will improve ?Outcome: Progressing ?Goal: Ability to maintain a clear airway will improve ?Outcome: Progressing ?  ?Problem: Education: ?Goal: Knowledge of disease or condition will improve ?Outcome: Progressing ?Goal: Understanding of medication regimen will improve ?Outcome: Progressing ?Goal: Individualized Educational Video(s) ?Outcome: Progressing ?  ?Problem: Activity: ?Goal: Ability to tolerate increased activity will improve ?Outcome: Progressing ?  ?Problem: Cardiac: ?Goal: Ability to achieve and maintain adequate cardiopulmonary perfusion will improve ?Outcome: Progressing ?  ?Problem: Health Behavior/Discharge Planning: ?Goal: Ability to manage health-related needs will improve ?Outcome: Progressing ?  ?

## 2021-09-05 NOTE — Care Management Important Message (Signed)
Important Message ? ?Patient Details  ?Name: Bianca Haley ?MRN: [id number] ?Date of Birth: 01/18/1951 ? ? ?Medicare Important Message Given:  Yes ? ? ? ? ?[name] ?09/05/2021, 10:56 AM ?

## 2021-09-05 NOTE — Progress Notes (Signed)
? ? ? [facility] ?                                                                            ? ? ?Bianca Haley, is a 71 y.o. female, DOB - 10-21-1950, HUD:[id number] ?Admit date - 09/02/2021    ?Outpatient Primary MD for the patient is Plotnikov, [name], MD ? ?LOS - 2  days ? ? ? ?Brief summary  ? ?Bianca Haley is a 71 y.o. female with medical history significant of chronic back pain and HTN who presented to Bianca Haley PCP today with URI symptoms and palpitations. Bianca Haley also reported sob. Bianca Haley was found ot be in afib with RVR and had elevated troponins.  ?Cardiology consulted and Bianca Haley underwent cath, non obstructive.  ? ?Assessment & Plan  ? ? ?Assessment and Plan: ? ?Paroxysmal atrial fibrillation ?Rate controlled with cardizem 240 mg daily and metoprolol 50 mg BID.  ?Cardiology consulted, .Echocardiogram showed preserved LVEF and grade 1 diastolic dysfunction. Right ventricular systolic pressure around 57.  ?  Bianca Haley CHA2DS2-VASc score is 3. Bianca Haley was started on IV heparin for anticoagulation and transitioned to eliquis .  ? ? ?Elevated troponins probably secondary to A-fib with RVR and  URI symptoms ?Cardiology on board. Underwent cath which did not show any CAD.  ? ? ?Choking episode last night ?SLP evaluation ordered. Recommended regular diet with thin liquids.  ?Recommendations given.  ? ? ?URI symptoms ?Patient's COVID 19 is negative, respiratory virus panel is negative. ?Symptomatic management with cough medication. ?Continue with doxycycline to complete a 5 days course. We were able to wean Bianca Haley off oxygen.  ?Oral prednisone for 5 days.  ? ? ? ?Essential hypertension ?Blood pressure parameters are elevated and Bianca Haley is on cardizem. And prn hydralazine.  ? ? ?Chronic pain syndrome ?Bianca Haley is on oxycodone 10 mg tid, valium .  ? ? ?Hyperlipidemia ?Continue with Crestor. ? ? ?Acute kidney injury probably secondary to dehydration ?Resolved. Recheck renal parameters wnl.  ? ?Hypokalemia:  ?Replaced.  ?Recheck in am.   ? ?Abnormal TSH, free t4 elevated. Free t3 is pending.  ?Unclear if this is from URI causing hyperthyroidism vs true hyperthyroidism.  ?Would wait till Bianca Haley URI improve and recheck thyroid panel before starting Bianca Haley on methimazole.  ?US thyroid ordered. Discussed the plan with the patient and Bianca Haley daughter on the phone.  ?The patient lives alone and Bianca Haley daughter would like to come tomorrow and pick Bianca Haley up from the hospital for discharge tomorrow.  ? ? ?Estimated body mass index is 22.15 kg/m? as calculated from the following: ?  Height as of this encounter: 5' 5.5" (1.664 m). ?  Weight as of this encounter: 61.3 kg. ? ?Code Status: full code.  ?DVT Prophylaxis:   ?apixaban (ELIQUIS) tablet 5 mg  ? ?Level of Care: Level of care: Telemetry Cardiac ?Family Communication: none at bedside.  ? ?Disposition Plan:     Remains inpatient appropriate: The patient lives alone and Bianca Haley daughter would like to come tomorrow and pick Bianca Haley up from the hospital for discharge tomorrow.  ? ?Procedures:  ?Echo ?Cardiac cath.  ? ?Consultants:   ?cardiology ? ?Antimicrobials:  ? ?Anti-infectives (From admission, onward)  ? ? Start  Dose/Rate Route Frequency Ordered Stop  ? 09/04/21 2000  doxycycline (VIBRA-TABS) tablet 100 mg       ? 100 mg Oral Every 12 hours 09/04/21 0825 09/07/21 2159  ? 09/02/21 1500  cefTRIAXone (ROCEPHIN) 1 g in sodium chloride 0.9 % 100 mL IVPB       ? 1 g ?200 mL/hr over 30 Minutes Intravenous  Once 09/02/21 1453 09/02/21 1538  ? 09/02/21 1500  doxycycline (VIBRAMYCIN) 100 mg in sodium chloride 0.9 % 250 mL IVPB  Status:  Discontinued       ? 100 mg ?125 mL/hr over 120 Minutes Intravenous Every 12 hours 09/02/21 1453 09/04/21 0825  ? ?  ? ? ? ?Medications ? ?Scheduled Meds: ? apixaban  5 mg Oral BID  ? carbamide peroxide  5 drop Both EARS BID  ? cycloSPORINE  1 drop Both Eyes q morning  ? diltiazem  240 mg Oral q morning  ? doxycycline  100 mg Oral Q12H  ? feeding supplement  237 mL Oral BID BM  ? gabapentin  1,200  mg Oral QHS  ? guaiFENesin  600 mg Oral BID  ? irbesartan  150 mg Oral Daily  ? melatonin  10 mg Oral QHS  ? metoprolol tartrate  50 mg Oral BID  ? multivitamin with minerals  1 tablet Oral Daily  ? oxyCODONE  10 mg Oral TID  ? pantoprazole  40 mg Oral BID AC  ? PARoxetine  40 mg Oral BID  ? polyvinyl alcohol  1 drop Both Eyes QHS  ? predniSONE  20 mg Oral Q breakfast  ? rosuvastatin  5 mg Oral q morning  ? sodium chloride flush  10-40 mL Intracatheter Q12H  ? sodium chloride flush  3 mL Intravenous Q12H  ? sodium chloride flush  3 mL Intravenous Q12H  ? traZODone  150 mg Oral QHS  ? ?Continuous Infusions: ? sodium chloride    ? ?PRN Meds:.sodium chloride, acetaminophen **OR** acetaminophen, acetaminophen, benzonatate, diazepam, guaiFENesin-codeine, hydrALAZINE, hydrALAZINE, metoprolol tartrate, sodium chloride flush ? ? ? ?Subjective:  ? ?Bianca Haley was seen and examined today.  Bianca Haley reports ear full of wax. Bianca Haley appears congested.  ?Objective:  ? ?Vitals:  ? 09/05/21 0023 09/05/21 0544 09/05/21 1102 09/05/21 1132  ?BP: (!) 139/122 (!) 169/90 (!) 185/83 (!) 182/103  ?Pulse: 87 69 85 85  ?Resp: '20 20  17  '$ ?Temp: 98 ?F (36.7 ?C) 98.1 ?F (36.7 ?C)  97.7 ?F (36.5 ?C)  ?TempSrc: Oral Oral  Oral  ?SpO2: 93% 94%  91%  ?Weight:      ?Height:      ? ? ?Intake/Output Summary (Last 24 hours) at 09/05/2021 1641 ?Last data filed at 09/05/2021 1200 ?Gross per 24 hour  ?Intake 1051.73 ml  ?Output --  ?Net 1051.73 ml  ? ? ?Filed Weights  ? 09/02/21 1248 09/03/21 1501 09/04/21 0444  ?Weight: 62.1 kg 61.3 kg 61.3 kg  ? ? ? ?Exam ?General exam: Appears calm and comfortable  ?Respiratory system: Clear to auscultation. Respiratory effort normal. ?Cardiovascular system: S1 & S2 heard, RRR. No JVD,  No pedal edema. ?Gastrointestinal system: Abdomen is nondistended, soft and nontender. Normal bowel sounds heard. ?Central nervous system: Alert and oriented. No focal neurological deficits. ?Extremities: Symmetric 5 x 5 power. ?Skin: No rashes,  lesions or ulcers ?Psychiatry:Mood & affect appropriate.  ? ? ? ? ?Data Reviewed:  I have personally reviewed following labs and imaging studies ? ? ?CBC ?Lab Results  ?Component Value Date  ?  WBC 9.8 09/05/2021  ? RBC 4.46 09/05/2021  ? HGB 12.8 09/05/2021  ? HCT 38.1 09/05/2021  ? MCV 85.4 09/05/2021  ? MCH 28.7 09/05/2021  ? PLT 345 09/05/2021  ? MCHC 33.6 09/05/2021  ? RDW 13.2 09/05/2021  ? LYMPHSABS 0.8 09/02/2021  ? MONOABS 1.0 09/02/2021  ? EOSABS 0.0 09/02/2021  ? BASOSABS 0.1 09/02/2021  ? ? ? ?Last metabolic panel ?Lab Results  ?Component Value Date  ? NA 140 09/05/2021  ? K 3.2 (L) 09/05/2021  ? CL 100 09/05/2021  ? CO2 29 09/05/2021  ? BUN 12 09/05/2021  ? CREATININE 0.72 09/05/2021  ? GLUCOSE 122 (H) 09/05/2021  ? GFRNONAA >60 09/05/2021  ? GFRAA >60 10/20/2018  ? CALCIUM 8.9 09/05/2021  ? PHOS 4.5 02/21/2013  ? PROT 7.4 09/02/2021  ? ALBUMIN 3.1 (L) 09/02/2021  ? LABGLOB 2.3 11/23/2017  ? AGRATIO 2.0 11/23/2017  ? BILITOT 1.2 09/02/2021  ? ALKPHOS 83 09/02/2021  ? AST 62 (H) 09/02/2021  ? ALT 30 09/02/2021  ? ANIONGAP 11 09/05/2021  ? ? ?CBG (last 3)  ?No results for input(s): GLUCAP in the last 72 hours.  ? ? ?Coagulation Profile: ?No results for input(s): INR, PROTIME in the last 168 hours. ? ? ?Radiology Studies: ?CARDIAC CATHETERIZATION ? ?Result Date: 09/04/2021 ?  The left ventricular systolic function is normal.   LV end diastolic pressure is normal.   The left ventricular ejection fraction is 55-65% by visual estimate.   There is no aortic valve stenosis. No angiographically apparent coronary artery disease. Continue medical therapy. Restart IV heparin for atrial fibrillation, 2 hours after TR band removed.  ? ?US THYROID ? ?Result Date: 09/05/2021 ?CLINICAL DATA:  Hyperthyroidism EXAM: THYROID ULTRASOUND TECHNIQUE: Ultrasound examination of the thyroid gland and adjacent soft tissues was performed. COMPARISON:  None Available. FINDINGS: Parenchymal Echotexture: Mildly heterogenous Isthmus: 2 mm  Right lobe: 4.1 x 1.5 x 1.4 cm Left lobe: 3.8 x 1.5 x 1.3 cm _________________________________________________________ Estimated total number of nodules >/= 1 cm: 1 Number of spongiform nodules >/=  2 c

## 2021-09-05 NOTE — Progress Notes (Signed)
ANTICOAGULATION CONSULT NOTE ?Pharmacy Consult for Heparin > apixaban ?Indication: atrial fibrillation ?Brief A/P: Heparin level subtherapeutic Increase Heparin rate ? ?Patient Measurements: ?Height: 5' 5.5" (166.4 cm) ?Weight: 61.3 kg (135 lb 2.3 oz) ?IBW/kg (Calculated) : 58.15 ?Heparin Dosing Weight: 62.1 kg ? ?Vital Signs: ?Temp: 98.1 ?F (36.7 ?C) (05/05 0544) ?Temp Source: Oral (05/05 0544) ?BP: 185/83 (05/05 1102) ?Pulse Rate: 85 (05/05 1102) ? ?Labs: ?Recent Labs  ?  09/02/21 ?1302 09/02/21 ?2050 09/02/21 ?2230 09/03/21 ?0215 09/03/21 ?1347 09/04/21 ?5573 09/04/21 ?2227 09/05/21 ?0743  ?HGB 14.5  --   --  11.8*  --  13.4  --  12.8  ?HCT 43.2  --   --  36.3  --  39.3  --  38.1  ?PLT 321  --   --  278  --  357  --  345  ?HEPARINUNFRC  --   --   --  0.94*   < > 0.19* 0.17* 0.38  ?CREATININE 1.42*  --   --  0.79  --   --   --  0.72  ?TROPONINIHS  --  173* 186*  --   --   --   --   --   ? < > = values in this interval not displayed.  ? ? ? ?Estimated Creatinine Clearance: 57.5 mL/min (by C-G formula based on SCr of 0.72 mg/dL). ?  ? ?Assessment: ?71 y.o. female with Afib on heparin drip 1000 uts/hr with heparin level at goal 0.38.  no bleeding noted, cbc stable  ?Wt > 60kg, age < 38yo Cr < 1.5 ? ?Goal of Therapy:  ?Heparin level 0.3-0.7 units/ml ?Monitor platelets by anticoagulation protocol: Yes ?  ?Plan:  ?Stop heparin drip > apixaban '5mg'$  BID  ? ? ?Bonnita Nasuti Pharm.D. CPP, BCPS ?Clinical Pharmacist ?[phone] ?09/05/2021 11:13 AM  ? ? ?

## 2021-09-05 NOTE — Progress Notes (Signed)
? ?Progress Note ? ?Patient Name: MAYDELIN DEMING ?Date of Encounter: 09/05/2021 ? ?[facility] Cardiologist: New ? ?Subjective  ? ?Pt denies CP or dyspnea ? ?Inpatient Medications  ?  ?Scheduled Meds: ? aspirin EC  81 mg Oral Daily  ? cycloSPORINE  1 drop Both Eyes q morning  ? diltiazem  240 mg Oral q morning  ? doxycycline  100 mg Oral Q12H  ? feeding supplement  237 mL Oral BID BM  ? gabapentin  1,200 mg Oral QHS  ? guaiFENesin  600 mg Oral BID  ? irbesartan  150 mg Oral Daily  ? melatonin  10 mg Oral QHS  ? metoprolol tartrate  50 mg Oral BID  ? multivitamin with minerals  1 tablet Oral Daily  ? oxyCODONE  10 mg Oral TID  ? pantoprazole  40 mg Oral BID AC  ? PARoxetine  40 mg Oral BID  ? polyvinyl alcohol  1 drop Both Eyes QHS  ? potassium chloride  40 mEq Oral Once  ? predniSONE  20 mg Oral Q breakfast  ? rosuvastatin  5 mg Oral q morning  ? sodium chloride flush  10-40 mL Intracatheter Q12H  ? sodium chloride flush  3 mL Intravenous Q12H  ? sodium chloride flush  3 mL Intravenous Q12H  ? sodium chloride flush  3 mL Intravenous Q12H  ? traZODone  150 mg Oral QHS  ? ?Continuous Infusions: ? sodium chloride    ? heparin 1,000 Units/hr (09/04/21 2318)  ? ?PRN Meds: ?sodium chloride, acetaminophen **OR** acetaminophen, acetaminophen, benzonatate, diazepam, guaiFENesin-codeine, hydrALAZINE, hydrALAZINE, metoprolol tartrate, sodium chloride flush, sodium chloride flush  ? ?Vital Signs  ?  ?Vitals:  ? 09/04/21 1627 09/04/21 2007 09/05/21 0023 09/05/21 0544  ?BP: (!) 188/100 (!) 176/100 (!) 139/122 (!) 169/90  ?Pulse: 81  87 69  ?Resp: (!) '21 20 20 20  '$ ?Temp: 98.1 ?F (36.7 ?C) 98.2 ?F (36.8 ?C) 98 ?F (36.7 ?C) 98.1 ?F (36.7 ?C)  ?TempSrc: Oral Oral Oral Oral  ?SpO2: 90%  93% 94%  ?Weight:      ?Height:      ? ? ?Intake/Output Summary (Last 24 hours) at 09/05/2021 0933 ?Last data filed at 09/05/2021 0300 ?Gross per 24 hour  ?Intake 327.08 ml  ?Output --  ?Net 327.08 ml  ? ? ? ?  09/04/2021  ?  4:44 AM 09/03/2021  ?  3:01 PM  09/02/2021  ? 12:48 PM  ?Last 3 Weights  ?Weight (lbs) 135 lb 2.3 oz 135 lb 3.2 oz 137 lb  ?Weight (kg) 61.3 kg 61.326 kg 62.143 kg  ?   ? ?Telemetry  ?  ?Sinus - Personally Reviewed ? ?Physical Exam  ? ?GEN: NAD ?Neck: supple ?Cardiac: RRR ?Respiratory: Diminished BS; no wheeze ?GI: Soft, NT/ND ?MS: No edema; radial cath site with no hematoma ?Neuro:  Grossly intact ?Psych: Normal affect  ? ?Labs  ?  ?High Sensitivity Troponin:   ?Recent Labs  ?Lab 09/02/21 ?2050 09/02/21 ?2230  ?TROPONINIHS 173* 186*  ? ?   ?Chemistry ?Recent Labs  ?Lab 09/02/21 ?1302 09/03/21 ?0215 09/05/21 ?0626  ?NA 138 138 140  ?K 3.0* 3.6 3.2*  ?CL 98 100 100  ?CO2 '22 27 29  '$ ?GLUCOSE 132* 99 122*  ?BUN 30* 16 12  ?CREATININE 1.42* 0.79 0.72  ?CALCIUM 9.3 8.5* 8.9  ?MG 2.2  --   --   ?PROT 7.4  --   --   ?ALBUMIN 3.1*  --   --   ?  AST 62*  --   --   ?ALT 30  --   --   ?ALKPHOS 83  --   --   ?BILITOT 1.2  --   --   ?GFRNONAA 39* >60 >60  ?ANIONGAP 18* 11 11  ? ?  ? ?Hematology ?Recent Labs  ?Lab 09/03/21 ?0215 09/04/21 ?3354 09/05/21 ?5625  ?WBC 8.6 11.2* 9.8  ?RBC 4.19 4.61 4.46  ?HGB 11.8* 13.4 12.8  ?HCT 36.3 39.3 38.1  ?MCV 86.6 85.2 85.4  ?MCH 28.2 29.1 28.7  ?MCHC 32.5 34.1 33.6  ?RDW 13.2 13.1 13.2  ?PLT 278 357 345  ? ? ? ?Radiology  ?  ?CARDIAC CATHETERIZATION ? ?Result Date: 09/04/2021 ?  The left ventricular systolic function is normal.   LV end diastolic pressure is normal.   The left ventricular ejection fraction is 55-65% by visual estimate.   There is no aortic valve stenosis. No angiographically apparent coronary artery disease. Continue medical therapy. Restart IV heparin for atrial fibrillation, 2 hours after TR band removed.   ? ? ?Patient Profile  ?   ?71 y.o. female with past medical history of hypertension, endometriosis, gastroesophageal reflux disease admitted with upper respiratory infection and noted to be in atrial fibrillation with rapid ventricular response. ? ?Assessment & Plan  ?  ?Paroxysmal atrial  fibrillation-patient remains in sinus rhythm.  We will continue metoprolol and Cardizem for rate control if atrial fibrillation recurs.  Discontinue IV heparin and treat with apixaban 5 mg twice daily.  LV function is normal.   ?Abnormal electrocardiogram-cardiac catheterization revealed normal coronary arteries.  Discontinue aspirin. ?Valvular heart disease-noted to have moderate mitral regurgitation and moderate aortic insufficiency on recent echocardiogram.  Will need follow-up as an outpatient. ?Upper respiratory infection-managed by primary care. ?Hypertension-blood pressure remains elevated.  However ARB was added yesterday.  Would continue to follow and we will advance as an outpatient if needed. ?Hyperlipidemia-continue Crestor at present dose. ?Acute renal insufficiency-renal function has normalized. ?Elevated free T4-patient appears to have hyperthyroidism.  Needs further treatment.  We will leave to primary service.  This potentially could have contributed to atrial fibrillation. ?Hypokalemia-supplement. ? ?Cardiology will sign off.  We will arrange follow-up with APP 2 to 4 weeks after discharge.  Follow-up with me in approximately 3 months.  Please call with questions. ? ?For questions or updates, please contact [place] ?Please consult [email or web address] for contact info under  ? ?  ?   ?Signed, ?Kirk Ruths, MD  ?09/05/2021, 9:33 AM   ? ?

## 2021-09-05 NOTE — TOC Initial Note (Signed)
Transition of Care (TOC) - Initial/Assessment Note  ? ? ?Patient Details  ?Name: Bianca Haley ?MRN: [id number] ?Date of Birth: 22-Jul-1950 ? ?Transition of Care (TOC) CM/SW Contact:    ?Graves-Bigelow, Ocie Cornfield, RN ?Phone Number: ?09/05/2021, 4:22 PM ? ?Clinical Narrative: Risk for readmission assessment completed. PTA patient was from home alone. Patient states she has a rolling walker, cane, and oxygen via [employer]. Patient states she uses one liter of oxygen qhs. If patient needs an increased liter flow- [employer] will need new orders submitted. Case Manager discussed home health physical therapy and the patient was agreeable to services. Case Manager offered the Medicare.gov list to the patient and she chose [facility] and start of care to begin within 24-48 hours post transition home. Patient will need orders and F2F. Patient states her daughter will be driving from [place] to transport her home. Weekend Case Manager to follow for additional needs.                ? ? ?Expected Discharge Plan: [place] ?Barriers to Discharge: Continued Medical Work up ? ? ?Patient Goals and CMS Choice ?Patient states their goals for this hospitalization and ongoing recovery are:: patient wants to rertun home. ?CMS Medicare.gov Compare Post Acute Care list provided to:: Patient ?Choice offered to / list presented to : Patient ? ?Expected Discharge Plan and Services ?Expected Discharge Plan: [place] ?In-house Referral: NA ?Discharge Planning Services: CM Consult ?Post Acute Care Choice: Home Health ?Living arrangements for the past 2 months: Apartment ?                ?DME Arranged: N/A ?  ?  ?  ?  ?HH Arranged: PT ?[place] Agency: [facility] ?Date HH Agency Contacted: 09/05/21 ?Time [place]: [id number] ?Representative spoke with at [place]: Anderson Malta ? ?Prior Living Arrangements/Services ?Living arrangements for the past 2 months:  Apartment ?Lives with:: Self ?Patient language and need for interpreter reviewed:: Yes ?Do you feel safe going back to the place where you live?: Yes      ?Need for Family Participation in Patient Care: Yes (Comment) ?Care giver support system in place?: Yes (comment) (Daughter coming from [place] to pick the patient up for transport home.) ?Current home services: DME (cane, rolling walker, and oxygen via [employer] (one liter qhs)) ?Criminal Activity/Legal Involvement Pertinent to Current Situation/Hospitalization: No - Comment as needed ? ?Activities of Daily Living ?Home Assistive Devices/Equipment: Gilford Rile (specify type), Oxygen ?ADL Screening (condition at time of admission) ?Patient's cognitive ability adequate to safely complete daily activities?: Yes ?Is the patient deaf or have difficulty hearing?: No ?Does the patient have difficulty seeing, even when wearing glasses/contacts?: No ?Does the patient have difficulty concentrating, remembering, or making decisions?: No ?Patient able to express need for assistance with ADLs?: Yes ?Does the patient have difficulty dressing or bathing?: No ?Independently performs ADLs?: Yes (appropriate for developmental age) ?Does the patient have difficulty walking or climbing stairs?: No ?Weakness of Legs: None ?Weakness of Arms/Hands: None ? ?Permission Sought/Granted ?Permission sought to share information with : Facility Sport and exercise psychologist, Case Manager ?Permission granted to share information with : Yes, Verbal Permission Granted ?   ? Permission granted to share info w AGENCY: [facility] ?   ?   ? ?Emotional Assessment ?Appearance:: Appears stated age ?Attitude/Demeanor/Rapport: Engaged ?Affect (typically observed): Appropriate ?Orientation: : Oriented to Self, Oriented to Place, Oriented to  Time, Oriented  to Situation ?Alcohol / Substance Use: Not Applicable ?Psych Involvement: No (comment) ? ?Admission diagnosis:  Hypokalemia [E87.6] ?Dyspnea [R06.00] ?Tachycardia  [R00.0] ?Prolonged Q-T interval on ECG [R94.31] ?Acute respiratory failure with hypoxia ([place]) [J96.01] ?Patient Active Problem List  ? Diagnosis Date Noted  ? Elevated troponin   ? Dyspnea 09/03/2021  ? PAF (paroxysmal atrial fibrillation) ([place]) 09/02/2021  ? Neck pain 04/21/2021  ? Chronic hypoxemic respiratory failure ([place]) 03/23/2021  ? Insomnia 03/23/2021  ? Grief reaction 03/23/2021  ? Pneumonia of left lower lobe due to infectious organism 03/09/2021  ? Hyponatremia 03/09/2021  ? Chronic diastolic CHF (congestive heart failure) ([place]) 03/09/2021  ? Acute on chronic respiratory failure with hypoxia ([place]) 03/09/2021  ? Anxiety disorder 10/02/2020  ? Atherosclerosis of aorta ([place]) 10/02/2020  ? Actinic keratosis 07/31/2020  ? AKI (acute kidney injury) ([place]) 07/26/2020  ? Encephalopathy acute 07/26/2020  ? Acute encephalopathy 07/25/2020  ? DOE (dyspnea on exertion) 01/30/2019  ? Sleep disorder 01/30/2019  ? Dilated bile duct   ? Pancreatic duct dilated   ? History of lumbar fusion 09/20/2018  ? Dilation of biliary tract   ? Hyperglycemia 06/23/2018  ? Memory loss 03/12/2017  ? Left foot infection 02/08/2017  ? Deep incisional surgical site infection 11/09/2016  ? Allergic reaction caused by a drug 11/06/2016  ? Fever 09/16/2016  ? Body aches 09/16/2016  ? Cough 09/16/2016  ? Edema 09/15/2016  ? URI (upper respiratory infection) 08/14/2016  ? Cerumen impaction 06/11/2016  ? Posterior tibial tendon dysfunction (PTTD) of left lower extremity 04/13/2016  ? Dyslipidemia 09/25/2015  ? S/P left TKA 07/15/2015  ? S/P knee replacement 07/15/2015  ? Esophageal dysphagia 09/18/2014  ? GERD without esophagitis 09/04/2014  ? Chronic narcotic dependence ([place]) 09/04/2014  ? Gross hematuria 08/31/2014  ? Hemorrhagic cystitis 08/24/2014  ? Bilateral lower extremity edema 04/23/2014  ? Cervical radiculopathy 04/02/2014  ? Osteoarthritis of left knee 10/25/2013  ? Knee pain, left 10/13/2013  ? Left leg swelling 10/13/2013  ? Lumbar  radiculopathy 10/11/2013  ? Chronic neck and back pain 09/28/2013  ? Hypokalemia 09/28/2013  ? Abnormal CT scan, head 09/27/2013  ? Nocturnal hypoxia 09/27/2013  ? Diastolic dysfunction- grade I 02/24/2013  ? Recurrent pneumonia 02/21/2013  ? Chronic cough 01/04/2013  ? Dupuytren's contracture 03/25/2012  ? Contracture of palmar fascia (Dupuytren's) 08/04/2011  ? VENOUS INSUFFICIENCY, LEFT LEG 05/14/2010  ? CRI (chronic renal insufficiency), stage 3 (moderate) ([place]) 12/02/2009  ? INGUINAL PAIN, RIGHT 12/02/2009  ? UNSPECIFIED ANEMIA 11/28/2009  ? PULMONARY NODULE 05/10/2008  ? FIBROIDS, UTERUS 08/03/2007  ? MIGRAINE HEADACHE 08/03/2007  ? TINNITUS, LEFT 08/03/2007  ? Essential hypertension 08/03/2007  ? ENDOMETRIOSIS 08/03/2007  ? ALCOHOL ABUSE, HX OF 08/03/2007  ? MEASLES, HX OF 08/03/2007  ? Personal history of urinary disorder 08/03/2007  ? ?PCP:  Plotnikov, [name], MD ?Pharmacy:   ?[id number] - [place], [address] ?[place] ?[place] [address] ?Phone: [phone] Fax: [phone] ? ? ?Readmission Risk Interventions ? ?  09/05/2021  ?  4:17 PM 03/11/2021  ? 10:22 AM  ?Readmission Risk Prevention Plan  ?Transportation Screening Complete Complete  ?PCP or Specialist Appt within 3-5 Days Complete Complete  ?[place] or Home Care Consult Complete Complete  ?Social Work Consult for Enterprise Planning/Counseling Complete Complete  ?Palliative Care Screening Not Applicable Complete  ?Medication Review Press photographer) Complete Complete  ? ? ? ?

## 2021-09-06 DIAGNOSIS — J9601 Acute respiratory failure with hypoxia: Secondary | ICD-10-CM | POA: Diagnosis not present

## 2021-09-06 DIAGNOSIS — J069 Acute upper respiratory infection, unspecified: Secondary | ICD-10-CM | POA: Diagnosis not present

## 2021-09-06 DIAGNOSIS — E785 Hyperlipidemia, unspecified: Secondary | ICD-10-CM | POA: Diagnosis not present

## 2021-09-06 LAB — LIPOPROTEIN A (LPA): Lipoprotein (a): 73.6 nmol/L — ABNORMAL HIGH (ref <75.0)

## 2021-09-06 LAB — THYROID ANTIBODIES
Thyroglobulin Antibody: <1 IU/mL (ref 0.0–0.9)
Thyroperoxidase Ab SerPl-aCnc: 18 IU/mL (ref 0–34)

## 2021-09-06 LAB — THYROID STIMULATING IMMUNOGLOBULIN: Thyroid Stimulating Immunoglob: <0.1 IU/L (ref 0.00–0.55)

## 2021-09-06 LAB — T3, FREE: T3, Free: 1.8 pg/mL — ABNORMAL LOW (ref 2.0–4.4)

## 2021-09-06 MED ORDER — DILTIAZEM HCL ER COATED BEADS 240 MG PO CP24
240.0000 mg | ORAL_CAPSULE | Freq: Every morning | ORAL | 1 refills | Status: DC
Start: 2021-09-07 — End: 2021-12-04

## 2021-09-06 MED ORDER — BENZONATATE 100 MG PO CAPS: 100.0000 mg | ORAL_CAPSULE | Freq: Three times a day (TID) | ORAL | 0 refills | Status: DC | PRN

## 2021-09-06 MED ORDER — METOPROLOL TARTRATE 50 MG PO TABS: 50.0000 mg | ORAL_TABLET | Freq: Two times a day (BID) | ORAL | 2 refills | Status: DC

## 2021-09-06 MED ORDER — DOXYCYCLINE HYCLATE 100 MG PO TABS: 100.0000 mg | ORAL_TABLET | Freq: Two times a day (BID) | ORAL | 0 refills | Status: AC

## 2021-09-06 MED ORDER — IRBESARTAN 150 MG PO TABS: 150.0000 mg | ORAL_TABLET | Freq: Every day | ORAL | 2 refills | Status: DC

## 2021-09-06 MED ORDER — ENSURE ENLIVE PO LIQD: 237.0000 mL | Freq: Two times a day (BID) | ORAL | 0 refills | Status: DC

## 2021-09-06 MED ORDER — APIXABAN 5 MG PO TABS: 5.0000 mg | ORAL_TABLET | Freq: Two times a day (BID) | ORAL | 2 refills | Status: DC

## 2021-09-06 NOTE — TOC Transition Note (Addendum)
Transition of Care (TOC) - CM/SW Discharge Note ? ? ?Patient Details  ?Name: Bianca Haley ?MRN: [id number] ?Date of Birth: 02-15-1951 ? ?Transition of Care (TOC) CM/SW Contact:  ?Konrad Penta, RN ?Phone Number: [phone] ?09/06/2021, 1:06 PM ? ? ?Clinical Narrative:   Spoke with patient's daughter Bianca Haley prior to [name]'s transition to home. Confirmed [name] does not require continuous oxygen. She remains at her baseline with oxygen at night. Patient wore oxygen at night prior to admission. Oxygen thru [employer]. Patient has other DME at home as previously documented.   ? ?Bianca Haley (daughter) also wants Probation officer to relay message to [facility]. Patient is hard of hearing over the phone. States patient lives in a controlled access building and will be unable to come to the door to open up. States if home health comes during normal business office hours, the rental office can open the door.  If patient is unable to be reached, Bianca Haley states to contact her at [phone]. ? ?Probation officer contacted Bradd Canary to make aware of the provided information from daughter Bianca Haley. Informed Delsa Sale patient to transition home today. ? ?Daughter to transport home. ? ?No further TOC needs identified. Please re-consult if needed.  ? ? ? ?Final next level of care: [place] ?Barriers to Discharge: No Barriers Identified ? ? ?Patient Goals and CMS Choice ?Patient states their goals for this hospitalization and ongoing recovery are:: patient wants to rertun home. ?CMS Medicare.gov Compare Post Acute Care list provided to:: Patient ?Choice offered to / list presented to : Patient ? ?Discharge Placement ?  ?           ?  ?  ?  ?  ? ?Discharge Plan and Services ?In-house Referral: NA ?Discharge Planning Services: CM Consult ?Post Acute Care Choice: Home Health          ?DME Arranged: N/A ?  ?  ?  ?  ?HH Arranged: PT ?[place] Agency: [facility] ((previously arranged)) ?Date HH Agency Contacted: 09/06/21 ?Time [place]: 5670 ?Representative spoke with at [place]: Bradd Canary ? ?Social Determinants of Health (SDOH) Interventions ?  ? ? ?Readmission Risk Interventions ? ?  09/05/2021  ?  4:17 PM 03/11/2021  ? 10:22 AM  ?Readmission Risk Prevention Plan  ?Transportation Screening Complete Complete  ?PCP or Specialist Appt within 3-5 Days Complete Complete  ?[place] or Home Care Consult Complete Complete  ?Social Work Consult for [place] Planning/Counseling Complete Complete  ?Palliative Care Screening Not Applicable Complete  ?Medication Review Press photographer) Complete Complete  ? ? ? ? ? ?

## 2021-09-07 LAB — CULTURE, BLOOD (ROUTINE X 2): Culture: NO GROWTH

## 2021-09-07 NOTE — Discharge Summary (Signed)
?Physician Discharge Summary ?  ?Patient: Bianca Haley MRN: [id number] DOB: 04/17/1951  ?Admit date:     09/02/2021  ?Discharge date: 09/06/2021  ?Discharge Physician: Hosie Poisson  ? ?PCP: Plotnikov, [name], MD  ? ?Recommendations at discharge:  ?Please check thyroid panel in 1 to 2 weeks post the viral syndrome  ?Please follow up with cbc and BMP in one week.  ?Please follow up with PCP in one week.  ?Please follow up with cardiology as scheduled.  ? ?Discharge Diagnoses: ?Principal Problem: ?  URI (upper respiratory infection) ?Active Problems: ?  PAF (paroxysmal atrial fibrillation) ([place]) ?  AKI (acute kidney injury) ([place]) ?  Essential hypertension ?  Chronic narcotic dependence ([place]) ?  Dyslipidemia ?  Dyspnea ?  Elevated troponin ? ? ? ?Hospital Course: ? ?Bianca Haley is a 71 y.o. female with medical history significant of chronic back pain and HTN who presented to her PCP today with URI symptoms and palpitations. She also reported sob. She was found ot be in afib with RVR and had elevated troponins.  ?Cardiology consulted and she underwent cath, non obstructive.  ?  ?Assessment and Plan: ? ? ?Paroxysmal atrial fibrillation ?Rate controlled with cardizem 240 mg daily and metoprolol 50 mg BID.  ?Cardiology consulted, .Echocardiogram showed preserved LVEF and grade 1 diastolic dysfunction. Right ventricular systolic pressure around 57.  ?  Her CHA2DS2-VASc score is 3. She was started on IV heparin for anticoagulation and transitioned to eliquis .  ?  ?  ?Elevated troponins probably secondary to A-fib with RVR and  URI symptoms ?Cardiology on board. Underwent cath which did not show any CAD.  ?  ?  ?Choking episode last night ?SLP evaluation ordered. Recommended regular diet with thin liquids.  ?Recommendations given.  ?  ?  ?URI symptoms ?Patient's COVID 19 is negative, respiratory virus panel is negative. ?Symptomatic management with cough medication. ?Continue with doxycycline to complete a 5 days course.  We were able to wean her off oxygen.  ?Complete the prednisone.  ?  ?  ?  ?Essential hypertension ?Blood pressure parameters are optimal.  ?  ?  ?Chronic pain syndrome ?Pt is on oxycodone 10 mg tid, valium .  ?  ?  ?Hyperlipidemia ?Continue with Crestor. ?  ?  ?Acute kidney injury probably secondary to dehydration ?Resolved. Recheck renal parameters wnl.  ?  ?Hypokalemia:  ?Replaced.  ? ?  ?Abnormal TSH, free t4 elevated. Free t3 is pending.  ?Unclear if this is from URI causing thyroiditis and  hyperthyroidism vs true hyperthyroidism.  ?Would wait till her URI improve and recheck thyroid panel before starting her on methimazole.  ?US thyroid ordered. Discussed the plan with the patient and her daughter on the phone and in the room.  ? ? ?  ?Estimated body mass index is 22.15 kg/m? as calculated from the following: ?  Height as of this encounter: 5' 5.5" (1.664 m). ?  Weight as of this encounter: 61.3 kg. ? ? ?Consultants: cardiology ?Procedures performed: cath  ?Disposition: Home ?Diet recommendation:  ?Discharge Diet Orders (From admission, onward)  ? ?  Start     Ordered  ? 09/06/21 0000  Diet - low sodium heart healthy       ? 09/06/21 1259  ? ?  ?  ? ?  ? ?Regular diet ?DISCHARGE MEDICATION: ?Allergies as of 09/06/2021   ? ?   Reactions  ? Penicillins Shortness Of Breath, Rash  ? Did it involve swelling of the  face/tongue/throat, SOB, or low BP? No ?Did it involve sudden or severe rash/hives, skin peeling, or any reaction on the inside of your mouth or nose? Yes ?Did you need to seek medical attention at a hospital or doctor's office? No ?When did it last happen?      10 + years ?If all above answers are ?NO?, may proceed with cephalosporin use.  ? Morphine And Related Swelling  ? Sedation/Swelling described like edema/bloating/doesn't help the pain ?Morphine only; tolerates other opioids.   ? Cefepime Rash  ? Reaction while taking both cefepime and vancomycin at [facility] (after 3 days)  ? Vancomycin Rash  ? Reaction  while taking both cefepime and vancomycin at [facility] (after 3 days)  ? ?  ? ?  ?Medication List  ?  ? ?STOP taking these medications   ? ?atenolol 100 MG tablet ?Commonly known as: TENORMIN ?  ? ?  ? ?TAKE these medications   ? ?acetaminophen 650 MG CR tablet ?Commonly known as: TYLENOL ?Take 1,300 mg by mouth every 8 (eight) hours as needed for pain. ?  ?albuterol 108 (90 Base) MCG/ACT inhaler ?Commonly known as: Ventolin HFA ?INHALE 2 PUFFS INTO THE LUNGS EVERY 4 HOURS AS NEEDED FOR WHEEZING OR SHORTNESS OF BREATH ?What changed:  ?how much to take ?how to take this ?when to take this ?reasons to take this ?additional instructions ?  ?apixaban 5 MG Tabs tablet ?Commonly known as: ELIQUIS ?Take 1 tablet (5 mg total) by mouth 2 (two) times daily. ?  ?benzonatate 100 MG capsule ?Commonly known as: TESSALON ?Take 1 capsule (100 mg total) by mouth 3 (three) times daily as needed for cough. ?  ?Calcium Plus Vitamin D3 600-12.5 MG-MCG Caps ?Generic drug: Calcium Carb-Cholecalciferol ?Take 2 tablets by mouth every morning. ?  ?cycloSPORINE 0.05 % ophthalmic emulsion ?Commonly known as: RESTASIS ?Place 1 drop into both eyes every morning. ?  ?diazepam 5 MG tablet ?Commonly known as: VALIUM ?Take 1 tablet (5 mg total) by mouth daily as needed for anxiety or muscle spasms. ?  ?diltiazem 240 MG 24 hr capsule ?Commonly known as: CARDIZEM CD ?Take 1 capsule (240 mg total) by mouth every morning. ?What changed:  ?medication strength ?See the new instructions. ?  ?doxycycline 100 MG tablet ?Commonly known as: VIBRA-TABS ?Take 1 tablet (100 mg total) by mouth every 12 (twelve) hours for 2 days. ?  ?feeding supplement Liqd ?Take 237 mLs by mouth 2 (two) times daily between meals. ?  ?fluticasone 50 MCG/ACT nasal spray ?Commonly known as: FLONASE ?Place 2 sprays into both nostrils daily as needed for allergies or rhinitis. ?  ?gabapentin 300 MG capsule ?Commonly known as: NEURONTIN ?Take 4 capsules (1,200 mg total) by mouth at  bedtime. ?  ?guaiFENesin 600 MG 12 hr tablet ?Commonly known as: Mucinex ?Take 1 tablet (600 mg total) by mouth 2 (two) times daily. ?What changed:  ?when to take this ?reasons to take this ?  ?irbesartan 150 MG tablet ?Commonly known as: AVAPRO ?Take 1 tablet (150 mg total) by mouth daily. ?  ?Melatonin 10 MG Subl ?Place 10 mg under the tongue at bedtime. ?  ?metoprolol tartrate 50 MG tablet ?Commonly known as: LOPRESSOR ?Take 1 tablet (50 mg total) by mouth 2 (two) times daily. ?  ?mirabegron ER 50 MG Tb24 tablet ?Commonly known as: MYRBETRIQ ?Take 50 mg by mouth every morning. ?  ?multivitamin with minerals Tabs tablet ?Take 1 tablet by mouth 2 (two) times daily. ?  ?OVER THE COUNTER MEDICATION ?  Take 2 capsules by mouth in the morning. [facility] ?  ?Oxycodone HCl 10 MG Tabs ?Take 10 mg by mouth 3 (three) times daily. ?  ?OXYGEN ?Inhale 1 L into the lungs See admin instructions. Overnight ?  ?pantoprazole 40 MG tablet ?Commonly known as: PROTONIX ?TAKE 1 TABLET(40 MG) BY MOUTH TWICE DAILY BEFORE A MEAL ?What changed: See the new instructions. ?  ?PARoxetine 40 MG tablet ?Commonly known as: PAXIL ?TAKE 1 TABLET(40 MG) BY MOUTH TWICE DAILY ?What changed: See the new instructions. ?  ?polyethylene glycol 17 g packet ?Commonly known as: MIRALAX / GLYCOLAX ?Take 17 g by mouth 2 (two) times daily. ?What changed:  ?when to take this ?reasons to take this ?additional instructions ?  ?potassium chloride 10 MEQ tablet ?Commonly known as: KLOR-CON ?TAKE 1 TABLET BY MOUTH DAILY ?What changed:  ?how to take this ?when to take this ?  ?Probiotic Caps ?Take 1 capsule by mouth every morning. ?  ?rosuvastatin 5 MG tablet ?Commonly known as: CRESTOR ?TAKE 1 TABLET(5 MG) BY MOUTH DAILY ?What changed: See the new instructions. ?  ?SUMAtriptan 100 MG tablet ?Commonly known as: IMITREX ?TAKE 1 TABLET BY MOUTH DAILY AS NEEDED FOR MIGRAINE. MAY REPEAT IN 2 HOURS IF HEADACHE PERSISTS OR RECURS ?What changed: See the new instructions. ?   ?Systane 0.4-0.3 % Gel ophthalmic gel ?Generic drug: Polyethyl Glycol-Propyl Glycol ?Place 1 application. into both eyes at bedtime. ?  ?traZODone 150 MG tablet ?Commonly known as: DESYREL ?TAKE 1 TABLET BY

## 2021-09-08 ENCOUNTER — Telehealth: Payer: Self-pay

## 2021-09-08 ENCOUNTER — Encounter: Payer: Self-pay | Admitting: Internal Medicine

## 2021-09-08 NOTE — Telephone Encounter (Signed)
Transition Care Management Follow-up Telephone Call ?Date of discharge and from where: [place] 09-06-21 Dx: URI ?How have you been since you were released from the hospital? Still congested ?Any questions or concerns? No ? ?Items Reviewed: ?Did the pt receive and understand the discharge instructions provided? Yes  ?Medications obtained and verified? Yes  ?Other? No  ?Any new allergies since your discharge? No  ?Dietary orders reviewed? Yes ?Do you have support at home? Yes  ? ?Home Care and Equipment/Supplies: ?Were home health services ordered? Yes PT ?If so, what is the name of the agency? [facility]   ?Has the agency set up a time to come to the patient's home? yes ?Were any new equipment or medical supplies ordered?  No ?What is the name of the medical supply agency? na ?Were you able to get the supplies/equipment? not applicable ?Do you have any questions related to the use of the equipment or supplies? No ? ?Functional Questionnaire: (I = Independent and D = Dependent) ?ADLs: I ? ?Bathing/Dressing- I ? ?Meal Prep- I ? ?Eating- I ? ?Maintaining continence- I ? ?Transferring/Ambulation- I ? ?Managing Meds- I ? ?Follow up appointments reviewed: ? ?PCP Hospital f/u appt confirmed? Yes  Scheduled to see Jeralyn Ruths NP on 09-18-21 @ 1120am. ?[facility] f/u appt confirmed? No . ?Are transportation arrangements needed? No  ?If their condition worsens, is the pt aware to call PCP or go to the Emergency Dept.? Yes ?Was the patient provided with contact information for the PCP's office or ED? Yes ?Was to pt encouraged to call back with questions or concerns? Yes  ?

## 2021-09-08 NOTE — Patient Instructions (Signed)
Please go to ED  now

## 2021-09-08 NOTE — Assessment & Plan Note (Signed)
High suspicion for recurrence, pt appears at least mod ill; advised to go to ED at this time ?

## 2021-09-08 NOTE — Assessment & Plan Note (Signed)
Pt with aifb 185 with palpitations, sob - advised for ED now ?

## 2021-09-08 NOTE — Assessment & Plan Note (Signed)
BP Readings from Last 3 Encounters:  ?09/06/21 (!) 171/78  ?09/02/21 132/68  ?07/17/21 122/70  ? ?Stable, pt to continue medical treatment avapro ? ?

## 2021-09-18 ENCOUNTER — Ambulatory Visit (INDEPENDENT_AMBULATORY_CARE_PROVIDER_SITE_OTHER): Payer: Medicare Other | Admitting: Nurse Practitioner

## 2021-09-18 ENCOUNTER — Encounter: Payer: Self-pay | Admitting: Nurse Practitioner

## 2021-09-18 VITALS — BP 118/76 | HR 56 | Temp 98.3°F | Ht 65.5 in | Wt 135.5 lb

## 2021-09-18 DIAGNOSIS — J069 Acute upper respiratory infection, unspecified: Secondary | ICD-10-CM

## 2021-09-18 DIAGNOSIS — I480 Paroxysmal atrial fibrillation: Secondary | ICD-10-CM | POA: Diagnosis not present

## 2021-09-18 DIAGNOSIS — E0590 Thyrotoxicosis, unspecified without thyrotoxic crisis or storm: Secondary | ICD-10-CM

## 2021-09-18 LAB — COMPREHENSIVE METABOLIC PANEL
ALT: 16 U/L (ref 0–35)
AST: 23 U/L (ref 0–37)
Albumin: 4 g/dL (ref 3.5–5.2)
Alkaline Phosphatase: 82 U/L (ref 39–117)
BUN: 16 mg/dL (ref 6–23)
CO2: 34 mEq/L — ABNORMAL HIGH (ref 19–32)
Calcium: 9.6 mg/dL (ref 8.4–10.5)
Chloride: 102 mEq/L (ref 96–112)
Creatinine, Ser: 0.86 mg/dL (ref 0.40–1.20)
GFR: 67.17 mL/min (ref >60.00)
Glucose, Bld: 108 mg/dL — ABNORMAL HIGH (ref 70–99)
Potassium: 4.9 mEq/L (ref 3.5–5.1)
Sodium: 139 mEq/L (ref 135–145)
Total Bilirubin: 0.4 mg/dL (ref 0.2–1.2)
Total Protein: 7 g/dL (ref 6.0–8.3)

## 2021-09-18 LAB — T3, FREE: T3, Free: 2.8 pg/mL (ref 2.3–4.2)

## 2021-09-18 LAB — T4, FREE: Free T4: 0.97 ng/dL (ref 0.60–1.60)

## 2021-09-18 LAB — TSH: TSH: 1.43 u[IU]/mL (ref 0.35–5.50)

## 2021-09-18 MED ORDER — BENZONATATE 100 MG PO CAPS: 100.0000 mg | ORAL_CAPSULE | Freq: Three times a day (TID) | ORAL | 0 refills | Status: DC | PRN

## 2021-09-18 NOTE — Assessment & Plan Note (Signed)
Continues to resolve.  Will refill Tessalon Perles that she can take as needed for cough suppression.  Oxygen saturations remain above 90% on room air.  She is encouraged to let her PCP know if cough persists when she sees him at her routine office visit coming up in about 3 weeks.  She reports her understanding.

## 2021-09-18 NOTE — Assessment & Plan Note (Signed)
We will repeat thyroid function test, further recommendations may be made based upon these results.

## 2021-09-18 NOTE — Assessment & Plan Note (Signed)
Clinically appears to be in sinus rhythm today.  Heart rate well controlled on current regimen.  For now I recommend she continue on medication as prescribed but to discuss the risk versus benefit of possibly discontinuing Eliquis and/or metoprolol with cardiology when she sees them later this month.  She reports her understanding.

## 2021-09-18 NOTE — Progress Notes (Signed)
Established Patient Office Visit  Subjective   Patient ID: NATALEY BAHRI, female    DOB: 02/10/1951  Age: 71 y.o. MRN: [id number]  Chief Complaint  Patient presents with   Transitions Of Care    Patient arrives for posthospital visit follow-up.  She was hospitalized from 5/2 - 5/6 for acute respiratory failure secondary to upper respiratory infection.  She additionally was found to be in atrial fibrillation with RVR with elevated troponin.  She underwent cardiac catheterization no obstruction was noted.  Her Cardizem dose was increased to '240mg'$ /day and she was started on metoprolol 50 mg twice a day as well as Eliquis.  Her URI was treated with 5-day course of doxycycline as well as prednisone.  COVID and viral respiratory illness panel were negative.  She did require oxygenation while hospitalized, but was able to be weaned off to room air prior to discharge.  She is scheduled to follow-up with cardiology later this month.  Thyroid function testing was completed while she was in the hospital which did show suppressed TSH and elevated T4.  She underwent ultrasound of thyroid which uncovered scattered cystic nodules, all being less than 1 cm in diameter.  None were large enough to warrant biopsy or additional follow-up.  It was recommended that she have thyroid function testing repeated to determine if she has true hypothyroidism or if her elevated T4 was related to her most recent infection.  Today, she reports she is feeling better.  She still has some malaise, but feels things are improving.  She continues to be without oxygen.  She is still coughing but denies shortness of breath.  She feels she may be over medicated from a atrial fibrillation standpoint and would like to discuss possibly discontinuing Eliquis and/or metoprolol.  She reports that she feels the atrial fibrillation was caused due to her missing a few doses of diltiazem while she was sick at home.  She denies any knowledge of  personal history of atrial fibrillation.  She continues to work with home health physical therapy.    Review of Systems  Constitutional:  Negative for fever and malaise/fatigue.  Respiratory:  Negative for shortness of breath.   Cardiovascular:  Negative for chest pain and palpitations.  Neurological:  Negative for dizziness.     Objective:     BP 118/76 (BP Location: Left Arm, Patient Position: Sitting, Cuff Size: Normal)   Pulse (!) 56   Temp 98.3 F (36.8 C) (Oral)   Ht 5' 5.5" (1.664 m)   Wt 135 lb 8 oz (61.5 kg)   SpO2 94%   BMI 22.21 kg/m  BP Readings from Last 3 Encounters:  09/18/21 118/76  09/06/21 (!) 171/78  09/02/21 132/68      Physical Exam Vitals reviewed.  Constitutional:      General: She is not in acute distress.    Appearance: Normal appearance.  HENT:     Head: Normocephalic and atraumatic.  Neck:     Vascular: No carotid bruit.  Cardiovascular:     Rate and Rhythm: Normal rate and regular rhythm.     Pulses: Normal pulses.     Heart sounds: Normal heart sounds.  Pulmonary:     Effort: Pulmonary effort is normal.     Breath sounds: Normal breath sounds.  Skin:    General: Skin is warm and dry.  Neurological:     General: No focal deficit present.     Mental Status: She is alert and oriented to person,  place, and time.  Psychiatric:        Mood and Affect: Mood normal.        Behavior: Behavior normal.        Judgment: Judgment normal.     No results found for any visits on 09/18/21.    The ASCVD Risk score (Arnett DK, et al., 2019) failed to calculate for the following reasons:   The patient has a prior MI or stroke diagnosis    Assessment & Plan:   Problem List Items Addressed This Visit       Cardiovascular and Mediastinum   PAF (paroxysmal atrial fibrillation) ([place]) - Primary    Clinically appears to be in sinus rhythm today.  Heart rate well controlled on current regimen.  For now I recommend she continue on medication as  prescribed but to discuss the risk versus benefit of possibly discontinuing Eliquis and/or metoprolol with cardiology when she sees them later this month.  She reports her understanding.       Relevant Orders   Comprehensive metabolic panel   TSH   T4, free   T3, free     Respiratory   URI (upper respiratory infection)    Continues to resolve.  Will refill Tessalon Perles that she can take as needed for cough suppression.  Oxygen saturations remain above 90% on room air.  She is encouraged to let her PCP know if cough persists when she sees him at her routine office visit coming up in about 3 weeks.  She reports her understanding.       Relevant Medications   benzonatate (TESSALON) 100 MG capsule   Other Relevant Orders   Comprehensive metabolic panel   TSH   T4, free   T3, free     Endocrine   Subclinical hyperthyroidism    We will repeat thyroid function test, further recommendations may be made based upon these results.       Relevant Orders   Comprehensive metabolic panel   TSH   T4, free   T3, free    Return for As scheduled.    Ailene Ards, NP

## 2021-09-23 NOTE — Progress Notes (Signed)
Cardiology Office Note:    Date:  09/30/2021   ID:  Bianca Haley, DOB Mar 22, 1951, MRN [id number]  PCP:  Bianca Anger, MD   [facility] Providers Cardiologist:  Kirk Ruths, MD Cardiology APP:  Ledora Bottcher, PA { Referring MD: Bianca Anger, MD   Chief Complaint  Patient presents with   Hospitalization Follow-up    PAF    History of Present Illness:    Bianca Haley is a 71 y.o. female with a hx of hypertension, hyperlipidemia, GERD, endometriosis, and recently diagnosed atrial fibrillation with RVR.  She was seen by her PCP and found to be in A-fib RVR and sent to the ER. She was admitted 5/2-09/06/21 for acute respiratory failure secondary to upper respiratory infection.  She had an elevated troponin with EKG changes concerning for LAD stenosis.  By the time of cardiac consultation, she was back in sinus rhythm.  Heart catheterization 09/04/21 with angiographically normal coronaries. Echo with normal LV and RV function, grade 1 DD, moderately elevated PA pressure - RVSP 57.5 mmHg, moderate MR, moderate AI, and mild to moderate TR.   She returns today for scheduled hospital follow up. She recounts the events leading up to her hospitalization. She is unsure of symptoms, but thinks she was symptomatic with the Afib. She is concerned she is on too many medications. We discussed her cardiac medications. She is tolerating them well. I suggested waiting three months before reducing BB or cardizem. She is more comfortable now on [place].    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   Hemorrhoids    History of colon polyps    History of endometriosis     and fibroids   History of hiatal hernia    History of UTI    Hypertension    Memory loss    Migraine    OSA (obstructive sleep apnea)    on nocturnal O2   Recurrent pneumonia 07/2018   Seasonal allergies    Vitamin D deficiency    Wears glasses     Past Surgical History:  Procedure Laterality Date   ABDOMINAL HYSTERECTOMY     BACK SURGERY     BIOPSY  10/20/2018   Procedure: BIOPSY;  Surgeon: [name], MD;  Location: [facility];  Service: Endoscopy;;   BLADDER SUSPENSION     COLONOSCOPY  02/16/2008   normal   DENTAL SURGERY     ESOPHAGOGASTRODUODENOSCOPY (EGD) WITH PROPOFOL N/A 10/20/2018   Procedure: ESOPHAGOGASTRODUODENOSCOPY (EGD) WITH PROPOFOL;  Surgeon: [name], MD;  Location: [facility];  Service: Endoscopy;  Laterality: N/A;   EUS N/A 10/20/2018   Procedure: UPPER ENDOSCOPIC ULTRASOUND (EUS) RADIAL;  Surgeon: [name], MD;  Location: [facility];  Service: Endoscopy;  Laterality: N/A;   FOOT SURGERY Left    x3   laparoscopy     for evaluation of endometriosis   LEFT HEART CATH AND CORONARY ANGIOGRAPHY N/A 09/04/2021   Procedure: LEFT HEART CATH AND CORONARY ANGIOGRAPHY;  Surgeon: Jettie Booze, MD;  Location:  [place] [facility];  Service: Cardiovascular;  Laterality: N/A;   TOTAL KNEE ARTHROPLASTY Left 07/15/2015   Procedure: TOTAL LEFT KNEE ARTHROPLASTY;  Surgeon: Paralee Cancel, MD;  Location: [facility];  Service: Orthopedics;  Laterality: Left;   UPPER GI ENDOSCOPY      Current Medications: Current Meds  Medication Sig   acetaminophen (TYLENOL) 650 MG CR tablet Take 1,300 mg by mouth every 8 (eight) hours as needed for pain.   albuterol (VENTOLIN HFA) 108 (90 Base) MCG/ACT inhaler INHALE 2 PUFFS INTO THE LUNGS EVERY 4 HOURS AS NEEDED FOR WHEEZING OR SHORTNESS OF BREATH (Patient taking differently: Inhale 2 puffs into the lungs every 4 (four) hours as needed for wheezing or shortness of breath.)   apixaban (ELIQUIS) 5 MG TABS tablet  Take 1 tablet (5 mg total) by mouth 2 (two) times daily.   Ascorbic Acid (VITAMIN C PO) Take 1 tablet by mouth every morning.   benzonatate (TESSALON) 100 MG capsule Take 1 capsule (100 mg total) by mouth 3 (three) times daily as needed for cough.   Calcium Carb-Cholecalciferol (CALCIUM PLUS VITAMIN D3) 600-500 MG-UNIT CAPS Take 2 tablets by mouth every morning.   Cholecalciferol (VITAMIN D) 2000 units tablet Take 2,000 Units by mouth at bedtime.    cycloSPORINE (RESTASIS) 0.05 % ophthalmic emulsion Place 1 drop into both eyes every morning.   diazepam (VALIUM) 5 MG tablet Take 1 tablet (5 mg total) by mouth daily as needed for anxiety or muscle spasms.   diltiazem (CARDIZEM CD) 240 MG 24 hr capsule Take 1 capsule (240 mg total) by mouth every morning.   feeding supplement (ENSURE ENLIVE / ENSURE PLUS) LIQD Take 237 mLs by mouth 2 (two) times daily between meals.   fluticasone (FLONASE) 50 MCG/ACT nasal spray Place 2 sprays into both nostrils daily as needed for allergies or rhinitis.   gabapentin (NEURONTIN) 300 MG capsule Take 4 capsules (1,200 mg total) by mouth at bedtime.   guaiFENesin (MUCINEX) 600 MG 12 hr tablet Take 1 tablet (600 mg total) by mouth 2 (two) times daily. (Patient taking differently: Take 600 mg by mouth 2 (two) times daily as needed for cough or to loosen phlegm.)   irbesartan (AVAPRO) 150 MG tablet Take 1 tablet (150 mg total) by mouth daily.   Melatonin 10 MG SUBL Place 10 mg under the tongue at bedtime.   metoprolol tartrate (LOPRESSOR) 50 MG tablet Take 1 tablet (50 mg total) by mouth 2 (two) times daily.   mirabegron ER (MYRBETRIQ) 50 MG TB24 tablet Take 50 mg by mouth every morning.   Multiple Vitamin (MULTIVITAMIN WITH MINERALS) TABS tablet Take 1 tablet by mouth 2 (two) times daily.   OVER THE COUNTER MEDICATION Take 2 capsules by mouth in the morning. [place]   Oxycodone HCl 10 MG TABS Take 10 mg by mouth 3 (three) times daily.   OXYGEN Inhale 1 L into the lungs  See admin instructions. Overnight   pantoprazole (PROTONIX) 40 MG tablet TAKE 1 TABLET(40 MG) BY MOUTH TWICE DAILY BEFORE A MEAL (Patient taking differently: 40 mg 2 (two) times daily before a meal.)   PARoxetine (PAXIL) 40 MG tablet TAKE 1 TABLET(40 MG) BY MOUTH TWICE DAILY (Patient taking differently: Take 40 mg by mouth 2 (two) times daily.)   Polyethyl Glycol-Propyl Glycol (SYSTANE) 0.4-0.3 % GEL ophthalmic gel Place 1 application. into both eyes at bedtime.   potassium chloride (KLOR-CON) 10 MEQ tablet TAKE 1 TABLET BY MOUTH DAILY (Patient taking differently: 10  mEq every morning.)   Probiotic CAPS Take 1 capsule by mouth every morning.   rosuvastatin (CRESTOR) 5 MG tablet TAKE 1 TABLET(5 MG) BY MOUTH DAILY (Patient taking differently: Take 5 mg by mouth every morning.)   SUMAtriptan (IMITREX) 100 MG tablet TAKE 1 TABLET BY MOUTH DAILY AS NEEDED FOR MIGRAINE. MAY REPEAT IN 2 HOURS IF HEADACHE PERSISTS OR RECURS (Patient taking differently: Take 100 mg by mouth See admin instructions. Take one tablet (100 mg) by mouth at onset of migraine headache, may repeat in 2 hours if headache persists or recurs)   traZODone (DESYREL) 150 MG tablet TAKE 1 TABLET BY MOUTH AT BEDTIME AS NEEDED FOR SLEEP (Patient taking differently: Take 150 mg by mouth at bedtime.)   vitamin B-12 (CYANOCOBALAMIN) 1000 MCG tablet Take 1,000 mcg by mouth every morning.     Allergies:   Penicillins, Morphine and related, Cefepime, and Vancomycin   Social History   Socioeconomic History   Marital status: Widowed    Spouse name: Not on file   Number of children: 2   Years of education: 2   Highest education level: Not on file  Occupational History   Occupation: Futures trader - retired  Tobacco Use   Smoking status: Never   Smokeless tobacco: Never  Vaping Use   Vaping Use: Never used  Substance and Sexual Activity   Alcohol use: No    Alcohol/week: 0.0 standard drinks    Comment: As of 07/22/18, it is been  several years since she drank any alcohol.   Drug use: No   Sexual activity: Yes    Partners: Male    Birth control/protection: Surgical  Other Topics Concern   Not on file  Social History Narrative   HSG, [employer] BS interior design. Married 07/21/1977 - 90 years, her husband died in 07/21/97.  She has been with her current, second husband since around 07-21-2008.  1 dtr - 07/22/1982 - lawyer, 1 son 1987/07/22. No h/o abuse.   Social Determinants of Health   Financial Resource Strain: Low Risk    Difficulty of Paying Living Expenses: Not hard at all  Food Insecurity: Not on file  Transportation Needs: Not on file  Physical Activity: Not on file  Stress: Not on file  Social Connections: Not on file     Family History: The patient's family history includes Emphysema in her mother; Heart disease (age of onset: 75) in her mother.  ROS:   Please see the history of present illness.     All other systems reviewed and are negative.  EKGs/Labs/Other Studies Reviewed:    The following studies were reviewed today:  Left heart cath 09/04/21   The left ventricular systolic function is normal.   LV end diastolic pressure is normal.   The left ventricular ejection fraction is 55-65% by visual estimate.   There is no aortic valve stenosis.   No angiographically apparent coronary artery disease.    Continue medical therapy. Restart IV heparin for atrial fibrillation, 2 hours after TR band removed.     Echo 09/03/21:  1. Left ventricular ejection fraction, by estimation, is 65 to 70%. The  left ventricle has normal function. The left ventricle has no regional  wall motion abnormalities. Left ventricular diastolic parameters are  consistent with Grade I diastolic  dysfunction (impaired relaxation).   2. Right ventricular systolic function is normal. The right ventricular  size is normal. There is moderately elevated pulmonary artery systolic  pressure. The estimated right ventricular systolic  pressure is 57.5 mmHg.   3.  Left atrial size was mildly dilated.   4. The mitral valve is degenerative. Moderate mitral valve regurgitation.  No evidence of mitral stenosis.   5. Tricuspid valve regurgitation is mild to moderate.   6. The aortic valve is normal in structure. Aortic valve regurgitation is  moderate. No aortic stenosis is present.   7. The inferior vena cava is normal in size with greater than 50%  respiratory variability, suggesting right atrial pressure of 3 mmHg.    EKG:  EKG is  ordered today.  The ekg ordered today demonstrates sinus rhythm HR 60 with LVH  Recent Labs: 03/09/2021: B Natriuretic Peptide 86.4 09/02/2021: Magnesium 2.2 09/05/2021: Hemoglobin 12.8; Platelets 345 09/18/2021: ALT 16; BUN 16; Creatinine, Ser 0.86; Potassium 4.9; Sodium 139; TSH 1.43  Recent Lipid Panel    Component Value Date/Time   CHOL 155 04/03/2020 1202   TRIG 102.0 04/03/2020 1202   HDL 61.40 04/03/2020 1202   CHOLHDL 3 04/03/2020 1202   VLDL 20.4 04/03/2020 1202   LDLCALC 73 04/03/2020 1202     Risk Assessment/Calculations:    CHA2DS2-VASc Score = 3   This indicates a 3.2% annual risk of stroke. The patient's score is based upon: CHF History: 0 HTN History: 1 Diabetes History: 0 Stroke History: 0 Vascular Disease History: 0 Age Score: 1 Gender Score: 1       Physical Exam:    VS:  BP 124/70 (BP Location: Left Arm, Patient Position: Sitting, Cuff Size: Normal)   Pulse 60   Ht 5' 5.5" (1.664 m)   Wt 145 lb 3.2 oz (65.9 kg)   BMI 23.80 kg/m     Wt Readings from Last 3 Encounters:  09/30/21 145 lb 3.2 oz (65.9 kg)  09/18/21 135 lb 8 oz (61.5 kg)  09/04/21 135 lb 2.3 oz (61.3 kg)     GEN:  Well nourished, well developed in no acute distress HEENT: Normal NECK: No JVD; No carotid bruits LYMPHATICS: No lymphadenopathy CARDIAC: RRR, no murmurs, rubs, gallops RESPIRATORY:  Clear to auscultation without rales, wheezing or rhonchi  ABDOMEN: Soft, non-tender, non-distended MUSCULOSKELETAL:  No  edema; No deformity  SKIN: Warm and dry NEUROLOGIC:  Alert and oriented x 3 PSYCHIATRIC:  Normal affect  Cath site C/D/I  ASSESSMENT:    1. Mitral valve insufficiency, unspecified etiology   2. Moderate aortic insufficiency   3. PAF (paroxysmal atrial fibrillation) ([place])   4. Chronic anticoagulation   5. Essential hypertension   6. Chronic diastolic CHF (congestive heart failure) (HCC)    PLAN:    In order of problems listed above:  Valvular heart disease Moderate MR Moderate AI - repeat echo in 6-12 months   Paroxysmal atrial fibrillation EKG today with sinus rhythm  Metoprolol 50 mg BID + 240 mg cardizem She is doing well, but would like to reduce if able.    Chronic anticoagulation Doing well on eliquis, no bleeding   Hypertension Lopressor, cardizem, and irbesartan   Follow up as scheduled. Echo in 9 months.       Medication Adjustments/Labs and Tests Ordered: Current medicines are reviewed at length with the patient today.  Concerns regarding medicines are outlined above.  Orders Placed This Encounter  Procedures   EKG 12-Lead   ECHOCARDIOGRAM COMPLETE   No orders of the defined types were placed in this encounter.   Patient Instructions  Medication Instructions:  No Changes *If you need a refill on your cardiac medications before  your next appointment, please call your pharmacy*   Lab Work: No Labs If you have labs (blood work) drawn today and your tests are completely normal, you will receive your results only by: [place] (if you have MyChart) OR A paper copy in the mail If you have any lab test that is abnormal or we need to change your treatment, we will call you to review the results.   Testing/Procedures: [address]. Your physician has requested that you have an echocardiogram. Echocardiography is a painless test that uses sound waves to create images of your heart. It provides your doctor with  information about the size and shape of your heart and how well your heart's chambers and valves are working. This procedure takes approximately one hour. There are no restrictions for this procedure.    Follow-Up: At [facility], you and your health needs are our priority.  As part of our continuing mission to provide you with exceptional heart care, we have created designated Provider Care Teams.  These Care Teams include your primary Cardiologist (physician) and Advanced Practice Providers (APPs -  Physician Assistants and Nurse Practitioners) who all work together to provide you with the care you need, when you need it.  We recommend signing up for the patient portal called "MyChart".  Sign up information is provided on this After Visit Summary.  MyChart is used to connect with patients for Virtual Visits (Telemedicine).  Patients are able to view lab/test results, encounter notes, upcoming appointments, etc.  Non-urgent messages can be sent to your provider as well.   To learn more about what you can do with MyChart, go to [email or web address].    Your next appointment:   Keep Scheduled Appointment  The format for your next appointment:   In Person  Provider:   Kirk Ruths, MD       Important Information About Sugar         Signed, Ledora Bottcher, [place]  09/30/2021 9:13 AM    [place]

## 2021-09-30 ENCOUNTER — Ambulatory Visit (INDEPENDENT_AMBULATORY_CARE_PROVIDER_SITE_OTHER): Payer: Medicare Other | Admitting: Physician Assistant

## 2021-09-30 ENCOUNTER — Encounter: Payer: Self-pay | Admitting: Physician Assistant

## 2021-09-30 VITALS — BP 124/70 | HR 60 | Ht 65.5 in | Wt 145.2 lb

## 2021-09-30 DIAGNOSIS — I340 Nonrheumatic mitral (valve) insufficiency: Secondary | ICD-10-CM | POA: Diagnosis not present

## 2021-09-30 DIAGNOSIS — Z7901 Long term (current) use of anticoagulants: Secondary | ICD-10-CM

## 2021-09-30 DIAGNOSIS — I351 Nonrheumatic aortic (valve) insufficiency: Secondary | ICD-10-CM | POA: Diagnosis not present

## 2021-09-30 DIAGNOSIS — I5032 Chronic diastolic (congestive) heart failure: Secondary | ICD-10-CM

## 2021-09-30 DIAGNOSIS — I10 Essential (primary) hypertension: Secondary | ICD-10-CM

## 2021-09-30 DIAGNOSIS — I480 Paroxysmal atrial fibrillation: Secondary | ICD-10-CM

## 2021-09-30 NOTE — Patient Instructions (Signed)
Medication Instructions:  No Changes *If you need a refill on your cardiac medications before your next appointment, please call your pharmacy*   Lab Work: No Labs If you have labs (blood work) drawn today and your tests are completely normal, you will receive your results only by: [place] (if you have MyChart) OR A paper copy in the mail If you have any lab test that is abnormal or we need to change your treatment, we will call you to review the results.   Testing/Procedures: [address], [address]. Your physician has requested that you have an echocardiogram. Echocardiography is a painless test that uses sound waves to create images of your heart. It provides your doctor with information about the size and shape of your heart and how well your heart's chambers and valves are working. This procedure takes approximately one hour. There are no restrictions for this procedure.    Follow-Up: At [facility], you and your health needs are our priority.  As part of our continuing mission to provide you with exceptional heart care, we have created designated Provider Care Teams.  These Care Teams include your primary Cardiologist (physician) and Advanced Practice Providers (APPs -  Physician Assistants and Nurse Practitioners) who all work together to provide you with the care you need, when you need it.  We recommend signing up for the patient portal called "MyChart".  Sign up information is provided on this After Visit Summary.  MyChart is used to connect with patients for Virtual Visits (Telemedicine).  Patients are able to view lab/test results, encounter notes, upcoming appointments, etc.  Non-urgent messages can be sent to your provider as well.   To learn more about what you can do with MyChart, go to [email or web address].    Your next appointment:   Keep Scheduled Appointment  The format for your next appointment:   In Person  Provider:   Kirk Ruths, MD        Important Information About Sugar

## 2021-10-02 ENCOUNTER — Ambulatory Visit (INDEPENDENT_AMBULATORY_CARE_PROVIDER_SITE_OTHER): Payer: Medicare Other | Admitting: Internal Medicine

## 2021-10-02 ENCOUNTER — Encounter: Payer: Self-pay | Admitting: Internal Medicine

## 2021-10-02 DIAGNOSIS — N183 Chronic kidney disease, stage 3 unspecified: Secondary | ICD-10-CM

## 2021-10-02 DIAGNOSIS — I5032 Chronic diastolic (congestive) heart failure: Secondary | ICD-10-CM

## 2021-10-02 DIAGNOSIS — G4734 Idiopathic sleep related nonobstructive alveolar hypoventilation: Secondary | ICD-10-CM

## 2021-10-02 DIAGNOSIS — I480 Paroxysmal atrial fibrillation: Secondary | ICD-10-CM

## 2021-10-02 DIAGNOSIS — F1120 Opioid dependence, uncomplicated: Secondary | ICD-10-CM | POA: Diagnosis not present

## 2021-10-02 MED ORDER — DIAZEPAM 5 MG PO TABS: 5.0000 mg | ORAL_TABLET | Freq: Every day | ORAL | 3 refills | Status: DC | PRN

## 2021-10-02 NOTE — Assessment & Plan Note (Signed)
Hydrate well 

## 2021-10-02 NOTE — Assessment & Plan Note (Addendum)
On O2 1l [place] at night

## 2021-10-02 NOTE — Progress Notes (Signed)
Subjective:  Patient ID: Bianca Haley, female    DOB: June 03, 1950  Age: 71 y.o. MRN: [id number]  CC: No chief complaint on file.   HPI Bianca Haley presents for post-hospital f/u for CAP, PAF, ARI Bianca Haley was in A-fib RVR and sent to the ER. She was admitted 5/2-09/06/21 for acute respiratory failure secondary to upper respiratory infection.  She had an elevated troponin with EKG changes concerning for LAD stenosis.  By the time of cardiac consultation, she was back in sinus rhythm.  Heart catheterization 09/04/21 with angiographically normal coronaries. Echo with normal LV and RV function, grade 1 DD, moderately elevated PA pressure - RVSP 57.5 mmHg, moderate MR, moderate AI, and mild to moderate TR.    Per hx:  " Admit date:     09/02/2021  Discharge date: 09/06/2021  Discharge Physician: Bianca Haley    PCP: Bianca Anger, MD    Recommendations at discharge:  Please check thyroid panel in 1 to 2 weeks post the viral syndrome  Please follow up with cbc and BMP in one week.  Please follow up with PCP in one week.  Please follow up with cardiology as scheduled.    Discharge Diagnoses: Principal Problem:   URI (upper respiratory infection) Active Problems:   PAF (paroxysmal atrial fibrillation) (HCC)   AKI (acute kidney injury) ([place])   Essential hypertension   Chronic narcotic dependence ([place])   Dyslipidemia   Dyspnea   Elevated troponin       Hospital Course:   Bianca Haley is a 71 y.o. female with medical history significant of chronic back pain and HTN who presented to her PCP today with URI symptoms and palpitations. She also reported sob. She was found ot be in afib with RVR and had elevated troponins.  Cardiology consulted and she underwent cath, non obstructive.    Assessment and Plan:     Paroxysmal atrial fibrillation Rate controlled with cardizem 240 mg daily and metoprolol 50 mg BID.  Cardiology consulted, .Echocardiogram showed preserved LVEF and  grade 1 diastolic dysfunction. Right ventricular systolic pressure around 57.    Her CHA2DS2-VASc score is 3. She was started on IV heparin for anticoagulation and transitioned to eliquis .      Elevated troponins probably secondary to A-fib with RVR and  URI symptoms Cardiology on board. Underwent cath which did not show any CAD.      Choking episode last night SLP evaluation ordered. Recommended regular diet with thin liquids.  Recommendations given.      URI symptoms Patient's COVID 19 is negative, respiratory virus panel is negative. Symptomatic management with cough medication. Continue with doxycycline to complete a 5 days course. We were able to wean her off oxygen.  Complete the prednisone.        Essential hypertension Blood pressure parameters are optimal.      Chronic pain syndrome Pt is on oxycodone 10 mg tid, valium .      Hyperlipidemia Continue with Crestor.     Acute kidney injury probably secondary to dehydration Resolved. Recheck renal parameters wnl.    Hypokalemia:  Replaced.      Abnormal TSH, free t4 elevated. Free t3 is pending.  Unclear if this is from URI causing thyroiditis and  hyperthyroidism vs true hyperthyroidism.  Would wait till her URI improve and recheck thyroid panel before starting her on methimazole.  US thyroid ordered. Discussed the plan with the patient and her daughter on the phone and in  the room.       Estimated body mass index is 22.15 kg/m as calculated from the following:   Height as of this encounter: 5' 5.5" (1.664 m).   Weight as of this encounter: 61.3 kg.     Consultants: cardiology Procedures performed: cath  Disposition: Home"   Outpatient Medications Prior to Visit  Medication Sig Dispense Refill   acetaminophen (TYLENOL) 650 MG CR tablet Take 1,300 mg by mouth every 8 (eight) hours as needed for pain.     albuterol (VENTOLIN HFA) 108 (90 Base) MCG/ACT inhaler INHALE 2 PUFFS INTO THE LUNGS EVERY 4  HOURS AS NEEDED FOR WHEEZING OR SHORTNESS OF BREATH (Patient taking differently: Inhale 2 puffs into the lungs every 4 (four) hours as needed for wheezing or shortness of breath.) 18 g 2   apixaban (ELIQUIS) 5 MG TABS tablet Take 1 tablet (5 mg total) by mouth 2 (two) times daily. 60 tablet 2   Ascorbic Acid (VITAMIN C PO) Take 1 tablet by mouth every morning.     benzonatate (TESSALON) 100 MG capsule Take 1 capsule (100 mg total) by mouth 3 (three) times daily as needed for cough. 20 capsule 0   Calcium Carb-Cholecalciferol (CALCIUM PLUS VITAMIN D3) 600-500 MG-UNIT CAPS Take 2 tablets by mouth every morning.     Cholecalciferol (VITAMIN D) 2000 units tablet Take 2,000 Units by mouth at bedtime.      cycloSPORINE (RESTASIS) 0.05 % ophthalmic emulsion Place 1 drop into both eyes every morning.     diltiazem (CARDIZEM CD) 240 MG 24 hr capsule Take 1 capsule (240 mg total) by mouth every morning. 60 capsule 1   feeding supplement (ENSURE ENLIVE / ENSURE PLUS) LIQD Take 237 mLs by mouth 2 (two) times daily between meals. 14220 mL 0   fluticasone (FLONASE) 50 MCG/ACT nasal spray Place 2 sprays into both nostrils daily as needed for allergies or rhinitis.     gabapentin (NEURONTIN) 300 MG capsule Take 4 capsules (1,200 mg total) by mouth at bedtime. 120 capsule 0   guaiFENesin (MUCINEX) 600 MG 12 hr tablet Take 1 tablet (600 mg total) by mouth 2 (two) times daily. (Patient taking differently: Take 600 mg by mouth 2 (two) times daily as needed for cough or to loosen phlegm.) 60 tablet 2   irbesartan (AVAPRO) 150 MG tablet Take 1 tablet (150 mg total) by mouth daily. 30 tablet 2   Melatonin 10 MG SUBL Place 10 mg under the tongue at bedtime.     metoprolol tartrate (LOPRESSOR) 50 MG tablet Take 1 tablet (50 mg total) by mouth 2 (two) times daily. 60 tablet 2   mirabegron ER (MYRBETRIQ) 50 MG TB24 tablet Take 50 mg by mouth every morning.     Multiple Vitamin (MULTIVITAMIN WITH MINERALS) TABS tablet Take 1  tablet by mouth 2 (two) times daily.     OVER THE COUNTER MEDICATION Take 2 capsules by mouth in the morning. [place]     Oxycodone HCl 10 MG TABS Take 10 mg by mouth 3 (three) times daily.     OXYGEN Inhale 1 L into the lungs See admin instructions. Overnight     pantoprazole (PROTONIX) 40 MG tablet TAKE 1 TABLET(40 MG) BY MOUTH TWICE DAILY BEFORE A MEAL (Patient taking differently: 40 mg 2 (two) times daily before a meal.) 180 tablet 3   PARoxetine (PAXIL) 40 MG tablet TAKE 1 TABLET(40 MG) BY MOUTH TWICE DAILY (Patient taking differently: Take 40 mg by mouth 2 (two)  times daily.) 180 tablet 3   Polyethyl Glycol-Propyl Glycol (SYSTANE) 0.4-0.3 % GEL ophthalmic gel Place 1 application. into both eyes at bedtime.     potassium chloride (KLOR-CON) 10 MEQ tablet TAKE 1 TABLET BY MOUTH DAILY (Patient taking differently: 10 mEq every morning.) 90 tablet 1   Probiotic CAPS Take 1 capsule by mouth every morning.     rosuvastatin (CRESTOR) 5 MG tablet TAKE 1 TABLET(5 MG) BY MOUTH DAILY (Patient taking differently: Take 5 mg by mouth every morning.) 90 tablet 3   SUMAtriptan (IMITREX) 100 MG tablet TAKE 1 TABLET BY MOUTH DAILY AS NEEDED FOR MIGRAINE. MAY REPEAT IN 2 HOURS IF HEADACHE PERSISTS OR RECURS (Patient taking differently: Take 100 mg by mouth See admin instructions. Take one tablet (100 mg) by mouth at onset of migraine headache, may repeat in 2 hours if headache persists or recurs) 12 tablet 5   tiZANidine (ZANAFLEX) 2 MG tablet Take 2 mg by mouth every 8 (eight) hours as needed.     traZODone (DESYREL) 150 MG tablet TAKE 1 TABLET BY MOUTH AT BEDTIME AS NEEDED FOR SLEEP (Patient taking differently: Take 150 mg by mouth at bedtime.) 30 tablet 11   vitamin B-12 (CYANOCOBALAMIN) 1000 MCG tablet Take 1,000 mcg by mouth every morning.     diazepam (VALIUM) 5 MG tablet Take 1 tablet (5 mg total) by mouth daily as needed for anxiety or muscle spasms. 30 tablet 3   polyethylene glycol (MIRALAX / GLYCOLAX)  packet Take 17 g by mouth 2 (two) times daily. (Patient not taking: Reported on 09/30/2021) 14 each 0   valACYclovir (VALTREX) 500 MG tablet Take 500 mg by mouth See admin instructions. Take one tablet (500 mg) by mouth twice daily for 3-5 days as needed for outbreaks (Patient not taking: Reported on 09/30/2021)     No facility-administered medications prior to visit.    ROS: Review of Systems  Constitutional:  Negative for activity change, appetite change, chills, fatigue and unexpected weight change.  HENT:  Negative for congestion, mouth sores and sinus pressure.   Eyes:  Negative for visual disturbance.  Respiratory:  Positive for cough. Negative for chest tightness.   Gastrointestinal:  Negative for abdominal pain and nausea.  Genitourinary:  Negative for difficulty urinating, frequency and vaginal pain.  Musculoskeletal:  Positive for back pain. Negative for gait problem.  Skin:  Negative for pallor and rash.  Neurological:  Negative for dizziness, tremors, weakness, numbness and headaches.  Psychiatric/Behavioral:  Negative for confusion and sleep disturbance.    Objective:  BP 130/70 (BP Location: Left Arm, Patient Position: Sitting, Cuff Size: Large)   Pulse 78   Temp 99.7 F (37.6 C) (Oral)   Ht 5' 5.5" (1.664 m)   Wt 135 lb (61.2 kg)   SpO2 92%   BMI 22.12 kg/m   BP Readings from Last 3 Encounters:  10/02/21 130/70  09/30/21 124/70  09/18/21 118/76    Wt Readings from Last 3 Encounters:  10/02/21 135 lb (61.2 kg)  09/30/21 145 lb 3.2 oz (65.9 kg)  09/18/21 135 lb 8 oz (61.5 kg)    Physical Exam Constitutional:      General: She is not in acute distress.    Appearance: Normal appearance. She is well-developed.  HENT:     Head: Normocephalic.     Right Ear: External ear normal.     Left Ear: External ear normal.     Nose: Nose normal.  Eyes:     General:  Right eye: No discharge.        Left eye: No discharge.     Conjunctiva/sclera: Conjunctivae  normal.     Pupils: Pupils are equal, round, and reactive to light.  Neck:     Thyroid: No thyromegaly.     Vascular: No JVD.     Trachea: No tracheal deviation.  Cardiovascular:     Rate and Rhythm: Normal rate and regular rhythm.     Heart sounds: Normal heart sounds.  Pulmonary:     Effort: No respiratory distress.     Breath sounds: No stridor. No wheezing.  Abdominal:     General: Bowel sounds are normal. There is no distension.     Palpations: Abdomen is soft. There is no mass.     Tenderness: There is no abdominal tenderness. There is no guarding or rebound.  Musculoskeletal:        General: No tenderness.     Cervical back: Normal range of motion and neck supple. No rigidity.  Lymphadenopathy:     Cervical: No cervical adenopathy.  Skin:    Findings: No erythema or rash.  Neurological:     Mental Status: She is oriented to person, place, and time.     Cranial Nerves: No cranial nerve deficit.     Motor: No abnormal muscle tone.     Coordination: Coordination normal.     Deep Tendon Reflexes: Reflexes normal.  Psychiatric:        Behavior: Behavior normal.        Thought Content: Thought content normal.        Judgment: Judgment normal.     A total time of 45 minutes was spent preparing to see the patient, reviewing tests, x-rays, operative reports and other inpatient medical records.  Also, obtaining history and performing comprehensive physical exam.  Additionally, counseling the patient regarding the above listed issues - PAF, CAP.   Finally, documenting clinical information in the health records, coordination of care, educating the patient re meds, PAF. It is a complex case.  Lab Results  Component Value Date   WBC 9.8 09/05/2021   HGB 12.8 09/05/2021   HCT 38.1 09/05/2021   PLT 345 09/05/2021   GLUCOSE 108 (H) 09/18/2021   CHOL 155 04/03/2020   TRIG 102.0 04/03/2020   HDL 61.40 04/03/2020   LDLCALC 73 04/03/2020   ALT 16 09/18/2021   AST 23 09/18/2021    NA 139 09/18/2021   K 4.9 09/18/2021   CL 102 09/18/2021   CREATININE 0.86 09/18/2021   BUN 16 09/18/2021   CO2 34 (H) 09/18/2021   TSH 1.43 09/18/2021   INR 1.0 09/21/2018   HGBA1C 6.1 06/23/2018    DG Chest 2 View  Result Date: 09/02/2021 CLINICAL DATA:  Cough. EXAM: CHEST - 2 VIEW COMPARISON:  March 09, 2021. FINDINGS: Stable cardiomediastinal silhouette. Both lungs are clear. The visualized skeletal structures are unremarkable. IMPRESSION: No active cardiopulmonary disease. Electronically Signed   By: Marijo Conception M.D.   On: 09/02/2021 14:42   ECHOCARDIOGRAM COMPLETE  Result Date: 09/03/2021    ECHOCARDIOGRAM REPORT   Patient Name:   SHAYDEN GINGRICH Schweickert Date of Exam: 09/03/2021 Medical Rec #:  [id number]       Height:       65.0 in Accession #:    [id number]      Weight:       137.0 lb Date of Birth:  03-04-1951       BSA:  1.684 m Patient Age:    89 years        BP:           144/59 mmHg Patient Gender: F               HR:           68 bpm. Exam Location:  Inpatient Procedure: 2D Echo, 3D Echo, Cardiac Doppler and Color Doppler Indications:    I35.1 Nonrheumatic aortic (valve) insufficiency  History:        Patient has prior history of Echocardiogram examinations, most                 recent 07/13/2018. Abnormal ECG, Aortic Valve Disease,                 Arrythmias:Atrial Fibrillation, Signs/Symptoms:Shortness of                 Breath and Dyspnea; Risk Factors:Hypertension and Dyslipidemia.                 ETOH.  Sonographer:    Roseanna Rainbow RDCS Referring Phys: [place] Comments: Exam delayed for occluded IV IMPRESSIONS  1. Left ventricular ejection fraction, by estimation, is 65 to 70%. The left ventricle has normal function. The left ventricle has no regional wall motion abnormalities. Left ventricular diastolic parameters are consistent with Grade I diastolic dysfunction (impaired relaxation).  2. Right ventricular systolic function is normal. The right  ventricular size is normal. There is moderately elevated pulmonary artery systolic pressure. The estimated right ventricular systolic pressure is 56.8 mmHg.  3. Left atrial size was mildly dilated.  4. The mitral valve is degenerative. Moderate mitral valve regurgitation. No evidence of mitral stenosis.  5. Tricuspid valve regurgitation is mild to moderate.  6. The aortic valve is normal in structure. Aortic valve regurgitation is moderate. No aortic stenosis is present.  7. The inferior vena cava is normal in size with greater than 50% respiratory variability, suggesting right atrial pressure of 3 mmHg. Comparison(s): Mildly increased aortic regurgitiation and mitral regurgitation when compared to prior. FINDINGS  Left Ventricle: Left ventricular ejection fraction, by estimation, is 65 to 70%. The left ventricle has normal function. The left ventricle has no regional wall motion abnormalities. The left ventricular internal cavity size was normal in size. There is  no left ventricular hypertrophy. Left ventricular diastolic parameters are consistent with Grade I diastolic dysfunction (impaired relaxation). Right Ventricle: The right ventricular size is normal. No increase in right ventricular wall thickness. Right ventricular systolic function is normal. There is moderately elevated pulmonary artery systolic pressure. The tricuspid regurgitant velocity is 3.26 m/s, and with an assumed right atrial pressure of 15 mmHg, the estimated right ventricular systolic pressure is 12.7 mmHg. Left Atrium: Left atrial size was mildly dilated. Right Atrium: Right atrial size was normal in size. Pericardium: There is no evidence of pericardial effusion. Mitral Valve: The mitral valve is degenerative in appearance. There is moderate thickening of the mitral valve leaflet(s). There is moderate calcification of the mitral valve leaflet(s). Moderate mitral valve regurgitation. No evidence of mitral valve stenosis. MV peak gradient,  7.1 mmHg. The mean mitral valve gradient is 3.0 mmHg. Tricuspid Valve: The tricuspid valve is normal in structure. Tricuspid valve regurgitation is mild to moderate. No evidence of tricuspid stenosis. Aortic Valve: The aortic valve is normal in structure. Aortic valve regurgitation is moderate. Aortic regurgitation PHT measures 412 msec. No aortic stenosis is present.  Pulmonic Valve: The pulmonic valve was normal in structure. Pulmonic valve regurgitation is not visualized. No evidence of pulmonic stenosis. Aorta: The aortic root is normal in size and structure. Venous: The inferior vena cava is normal in size with greater than 50% respiratory variability, suggesting right atrial pressure of 3 mmHg. IAS/Shunts: No atrial level shunt detected by color flow Doppler.  LEFT VENTRICLE PLAX 2D LVIDd:         4.10 cm     Diastology LVIDs:         2.30 cm     LV e' medial:    8.81 cm/s LV PW:         0.90 cm     LV E/e' medial:  11.3 LV IVS:        0.90 cm     LV e' lateral:   7.18 cm/s LVOT diam:     2.40 cm     LV E/e' lateral: 13.9 LV SV:         147 LV SV Index:   88 LVOT Area:     4.52 cm  LV Volumes (MOD) LV vol d, MOD A2C: 56.7 ml LV vol d, MOD A4C: 61.0 ml LV vol s, MOD A2C: 15.3 ml LV vol s, MOD A4C: 21.8 ml LV SV MOD A2C:     41.4 ml LV SV MOD A4C:     61.0 ml LV SV MOD BP:      39.8 ml RIGHT VENTRICLE             IVC RV S prime:     13.60 cm/s  IVC diam: 1.90 cm TAPSE (M-mode): 2.2 cm LEFT ATRIUM             Index        RIGHT ATRIUM           Index LA diam:        4.50 cm 2.67 cm/m   RA Area:     12.60 cm LA Vol (A2C):   65.2 ml 38.71 ml/m  RA Volume:   28.90 ml  17.16 ml/m LA Vol (A4C):   64.6 ml 38.35 ml/m LA Biplane Vol: 65.7 ml 39.01 ml/m  AORTIC VALVE LVOT Vmax:   158.00 cm/s LVOT Vmean:  99.200 cm/s LVOT VTI:    0.326 m AI PHT:      412 msec  AORTA Ao Root diam: 3.00 cm Ao Asc diam:  3.30 cm MITRAL VALVE                TRICUSPID VALVE MV Area (PHT): 3.66 cm     TR Peak grad:   42.5 mmHg MV Area  VTI:   3.77 cm     TR Vmax:        326.00 cm/s MV Peak grad:  7.1 mmHg MV Mean grad:  3.0 mmHg     SHUNTS MV Vmax:       1.33 m/s     Systemic VTI:  0.33 m MV Vmean:      76.8 cm/s    Systemic Diam: 2.40 cm MV Decel Time: 208 msec MV E velocity: 99.75 cm/s MV A velocity: 136.00 cm/s MV E/A ratio:  0.73 Candee Furbish MD Electronically signed by Candee Furbish MD Signature Date/Time: 09/03/2021/10:59:48 AM    Final     Assessment & Plan:   Problem List Items Addressed This Visit     Chronic diastolic CHF (congestive heart failure) (HCC)    On O2  1l [place] at night       Chronic narcotic dependence (HCC)    Cont on Opana - per [facility] On  Diazepam. The pt understands risks. Will not take together w/oxymorphone...  Potential benefits of a long term benzodiazepines  use as well as potential risks  and complications were explained to the patient and were aknowledged.      CRI (chronic renal insufficiency), stage 3 (moderate) (HCC)    Hydrate well       Nocturnal hypoxia    On O2 1l [place] at night       PAF (paroxysmal atrial fibrillation) ([place])    Bianca Haley was in A-fib RVR and sent to the ER. She was admitted 5/2-09/06/21 for acute respiratory failure secondary to upper respiratory infection.  She had an elevated troponin with EKG changes concerning for LAD stenosis.  By the time of cardiac consultation, she was back in sinus rhythm.  Heart catheterization 09/04/21 with angiographically normal coronaries. Echo with normal LV and RV function, grade 1 DD, moderately elevated PA pressure - RVSP 57.5 mmHg, moderate MR, moderate AI, and mild to moderate  On Eliquis         Meds ordered this encounter  Medications   diazepam (VALIUM) 5 MG tablet    Sig: Take 1 tablet (5 mg total) by mouth daily as needed for anxiety or muscle spasms.    Dispense:  30 tablet    Refill:  3    Needs office visit      Follow-up: Return in about 3 months (around 01/02/2022) for a follow-up visit.  [name], MD

## 2021-10-02 NOTE — Assessment & Plan Note (Addendum)
Bianca Haley was in A-fib RVR and sent to the ER. She was admitted 5/2-09/06/21 for acute respiratory failure secondary to upper respiratory infection.  She had an elevated troponin with EKG changes concerning for LAD stenosis.  By the time of cardiac consultation, she was back in sinus rhythm.  Heart catheterization 09/04/21 with angiographically normal coronaries. Echo with normal LV and RV function, grade 1 DD, moderately elevated PA pressure - RVSP 57.5 mmHg, moderate MR, moderate AI, and mild to moderate  On Eliquis

## 2021-10-02 NOTE — Assessment & Plan Note (Addendum)
On O2 1l [place] at night

## 2021-10-02 NOTE — Assessment & Plan Note (Signed)
Cont on Opana - per [facility] On  Diazepam. The pt understands risks. Will not take together w/oxymorphone...  Potential benefits of a long term benzodiazepines  use as well as potential risks  and complications were explained to the patient and were aknowledged.

## 2021-10-04 ENCOUNTER — Emergency Department (HOSPITAL_COMMUNITY): Payer: Medicare Other

## 2021-10-04 ENCOUNTER — Other Ambulatory Visit: Payer: Self-pay

## 2021-10-04 ENCOUNTER — Encounter (HOSPITAL_COMMUNITY): Payer: Self-pay

## 2021-10-04 ENCOUNTER — Inpatient Hospital Stay (HOSPITAL_COMMUNITY)
Admission: EM | Admit: 2021-10-04 | Discharge: 2021-10-08 | DRG: 917 | Disposition: A | Discharge status: Home Health | Payer: Medicare Other | Attending: Internal Medicine | Admitting: Internal Medicine

## 2021-10-04 DIAGNOSIS — E785 Hyperlipidemia, unspecified: Secondary | ICD-10-CM | POA: Diagnosis present

## 2021-10-04 DIAGNOSIS — R269 Unspecified abnormalities of gait and mobility: Secondary | ICD-10-CM | POA: Diagnosis present

## 2021-10-04 DIAGNOSIS — Z7901 Long term (current) use of anticoagulants: Secondary | ICD-10-CM

## 2021-10-04 DIAGNOSIS — T50901A Poisoning by unspecified drugs, medicaments and biological substances, accidental (unintentional), initial encounter: Secondary | ICD-10-CM | POA: Diagnosis present

## 2021-10-04 DIAGNOSIS — Z9981 Dependence on supplemental oxygen: Secondary | ICD-10-CM

## 2021-10-04 DIAGNOSIS — I129 Hypertensive chronic kidney disease with stage 1 through stage 4 chronic kidney disease, or unspecified chronic kidney disease: Secondary | ICD-10-CM | POA: Diagnosis present

## 2021-10-04 DIAGNOSIS — Z881 Allergy status to other antibiotic agents status: Secondary | ICD-10-CM

## 2021-10-04 DIAGNOSIS — R262 Difficulty in walking, not elsewhere classified: Secondary | ICD-10-CM

## 2021-10-04 DIAGNOSIS — F419 Anxiety disorder, unspecified: Secondary | ICD-10-CM

## 2021-10-04 DIAGNOSIS — F32A Depression, unspecified: Secondary | ICD-10-CM | POA: Diagnosis present

## 2021-10-04 DIAGNOSIS — R0902 Hypoxemia: Secondary | ICD-10-CM

## 2021-10-04 DIAGNOSIS — T402X1A Poisoning by other opioids, accidental (unintentional), initial encounter: Secondary | ICD-10-CM | POA: Diagnosis not present

## 2021-10-04 DIAGNOSIS — J9621 Acute and chronic respiratory failure with hypoxia: Secondary | ICD-10-CM | POA: Diagnosis present

## 2021-10-04 DIAGNOSIS — N1832 Chronic kidney disease, stage 3b: Secondary | ICD-10-CM | POA: Diagnosis present

## 2021-10-04 DIAGNOSIS — F1120 Opioid dependence, uncomplicated: Secondary | ICD-10-CM | POA: Diagnosis present

## 2021-10-04 DIAGNOSIS — T40601A Poisoning by unspecified narcotics, accidental (unintentional), initial encounter: Secondary | ICD-10-CM | POA: Diagnosis present

## 2021-10-04 DIAGNOSIS — I10 Essential (primary) hypertension: Secondary | ICD-10-CM | POA: Diagnosis present

## 2021-10-04 DIAGNOSIS — R7989 Other specified abnormal findings of blood chemistry: Secondary | ICD-10-CM

## 2021-10-04 DIAGNOSIS — G4733 Obstructive sleep apnea (adult) (pediatric): Secondary | ICD-10-CM | POA: Diagnosis present

## 2021-10-04 DIAGNOSIS — J9601 Acute respiratory failure with hypoxia: Secondary | ICD-10-CM

## 2021-10-04 DIAGNOSIS — I480 Paroxysmal atrial fibrillation: Secondary | ICD-10-CM

## 2021-10-04 DIAGNOSIS — Z96652 Presence of left artificial knee joint: Secondary | ICD-10-CM | POA: Diagnosis present

## 2021-10-04 DIAGNOSIS — Z880 Allergy status to penicillin: Secondary | ICD-10-CM

## 2021-10-04 DIAGNOSIS — Z79899 Other long term (current) drug therapy: Secondary | ICD-10-CM

## 2021-10-04 DIAGNOSIS — I080 Rheumatic disorders of both mitral and aortic valves: Secondary | ICD-10-CM | POA: Diagnosis present

## 2021-10-04 DIAGNOSIS — I248 Other forms of acute ischemic heart disease: Secondary | ICD-10-CM | POA: Diagnosis present

## 2021-10-04 DIAGNOSIS — R778 Other specified abnormalities of plasma proteins: Secondary | ICD-10-CM

## 2021-10-04 DIAGNOSIS — T40604A Poisoning by unspecified narcotics, undetermined, initial encounter: Principal | ICD-10-CM

## 2021-10-04 DIAGNOSIS — H9190 Unspecified hearing loss, unspecified ear: Secondary | ICD-10-CM | POA: Diagnosis present

## 2021-10-04 DIAGNOSIS — G894 Chronic pain syndrome: Secondary | ICD-10-CM | POA: Diagnosis present

## 2021-10-04 DIAGNOSIS — K219 Gastro-esophageal reflux disease without esophagitis: Secondary | ICD-10-CM | POA: Diagnosis present

## 2021-10-04 DIAGNOSIS — R7303 Prediabetes: Secondary | ICD-10-CM

## 2021-10-04 DIAGNOSIS — Z885 Allergy status to narcotic agent status: Secondary | ICD-10-CM

## 2021-10-04 LAB — CBC WITH DIFFERENTIAL/PLATELET
Abs Immature Granulocytes: 0.03 10*3/uL (ref 0.00–0.07)
Basophils Absolute: 0 10*3/uL (ref 0.0–0.1)
Basophils Relative: 0 %
Eosinophils Absolute: 0 10*3/uL (ref 0.0–0.5)
Eosinophils Relative: 0 %
HCT: 40.5 % (ref 36.0–46.0)
Hemoglobin: 13.3 g/dL (ref 12.0–15.0)
Immature Granulocytes: 0 %
Lymphocytes Relative: 14 %
Lymphs Abs: 1 10*3/uL (ref 0.7–4.0)
MCH: 29.2 pg (ref 26.0–34.0)
MCHC: 32.8 g/dL (ref 30.0–36.0)
MCV: 88.8 fL (ref 80.0–100.0)
Monocytes Absolute: 0.5 10*3/uL (ref 0.1–1.0)
Monocytes Relative: 7 %
Neutro Abs: 5.6 10*3/uL (ref 1.7–7.7)
Neutrophils Relative %: 79 %
Platelets: 220 10*3/uL (ref 150–400)
RBC: 4.56 MIL/uL (ref 3.87–5.11)
RDW: 14.5 % (ref 11.5–15.5)
WBC: 7.2 10*3/uL (ref 4.0–10.5)
nRBC: 0 % (ref 0.0–0.2)

## 2021-10-04 LAB — I-STAT VENOUS BLOOD GAS, ED
Acid-Base Excess: 2 mmol/L (ref 0.0–2.0)
Bicarbonate: 26.9 mmol/L (ref 20.0–28.0)
Calcium, Ion: 1.11 mmol/L — ABNORMAL LOW (ref 1.15–1.40)
HCT: 41 % (ref 36.0–46.0)
Hemoglobin: 13.9 g/dL (ref 12.0–15.0)
O2 Saturation: 81 %
Potassium: 4.1 mmol/L (ref 3.5–5.1)
Sodium: 139 mmol/L (ref 135–145)
TCO2: 28 mmol/L (ref 22–32)
pCO2, Ven: 42.2 mmHg — ABNORMAL LOW (ref 44–60)
pH, Ven: 7.414 (ref 7.25–7.43)
pO2, Ven: 45 mmHg (ref 32–45)

## 2021-10-04 LAB — TROPONIN I (HIGH SENSITIVITY)
Troponin I (High Sensitivity): 18 ng/L — ABNORMAL HIGH (ref <18)
Troponin I (High Sensitivity): 27 ng/L — ABNORMAL HIGH (ref <18)

## 2021-10-04 LAB — BASIC METABOLIC PANEL
Anion gap: 12 (ref 5–15)
BUN: 10 mg/dL (ref 8–23)
CO2: 24 mmol/L (ref 22–32)
Calcium: 9.3 mg/dL (ref 8.9–10.3)
Chloride: 102 mmol/L (ref 98–111)
Creatinine, Ser: 1.19 mg/dL — ABNORMAL HIGH (ref 0.44–1.00)
GFR, Estimated: 48 mL/min — ABNORMAL LOW (ref >60)
Glucose, Bld: 152 mg/dL — ABNORMAL HIGH (ref 70–99)
Potassium: 4.1 mmol/L (ref 3.5–5.1)
Sodium: 138 mmol/L (ref 135–145)

## 2021-10-04 MED ORDER — SODIUM CHLORIDE 0.9 % IV BOLUS
1000.0000 mL | Freq: Once | INTRAVENOUS | Status: AC
  Administered 2021-10-04: 1000 mL via INTRAVENOUS

## 2021-10-04 MED ORDER — LACTATED RINGERS IV SOLN
INTRAVENOUS | Status: AC
  Administered 2021-10-05: 50 mL/h via INTRAVENOUS

## 2021-10-04 MED ORDER — ONDANSETRON HCL 4 MG/2ML IJ SOLN
4.0000 mg | Freq: Once | INTRAMUSCULAR | Status: AC
  Administered 2021-10-04: 4 mg via INTRAVENOUS
  Filled 2021-10-04: qty 2

## 2021-10-04 MED ORDER — APIXABAN 5 MG PO TABS
5.0000 mg | ORAL_TABLET | Freq: Two times a day (BID) | ORAL | Status: DC
  Administered 2021-10-04 – 2021-10-08 (×7): 5 mg via ORAL
  Filled 2021-10-04 (×7): qty 1

## 2021-10-04 NOTE — ED Notes (Signed)
Pt ambulated in hallway. Pt displaying some weakness while walking. MD Trifan notified.

## 2021-10-04 NOTE — H&P (Addendum)
History and Physical  Bianca Haley OAC:[id number] DOB: Jul 25, 1950 DOA: 10/04/2021  Referring physician: Dr. Langston Masker, [place]  PCP: [name], MD  Outpatient Specialists: Cardiology, internal medicine. Patient coming from: Home via EMS, lives alone  Chief Complaint: Unresponsiveness  HPI: Bianca Haley is a 71 y.o. female with medical history significant for paroxysmal A-fib on Eliquis, CKD 3B, essential hypertension, hyperlipidemia, chronic narcotic dependence, moderate mitral regurgitation, moderate aortic regurgitation, chronic hypoxia on 2 L nasal cannula PRN, who presented to [facility] ED after being found unresponsive in her bed at her apartment.  Suspected accidental opioid overdose.  The patient is on home oxycodone 10 mg TID PRN as well as Valium 5 mg daily PRN.  She received Narcan via EMS with good response.  She was brought into the ED for further evaluation.  In the ED, she is somnolent with some confusion.  CT head non acute.  High sensitivity troponin mildly elevated and peaked at 27.  No evidence of acute ischemia on 12 lead EKG and no reported chest pain.  EDP recommended admission for observation.  The patient was admitted by [facility], hospitalist service.  ED Course: Tmax 97.9.  BP 113/58.  Pulse 63, respiratory 17, oxygen saturation 93% on room air.  Lab studies remarkable for serum glucose 152, GFR 48, high-sensitivity troponin 18, 27.  CBC with differentials especially unremarkable.  Review of Systems: Review of systems as noted in the HPI. All other systems reviewed and are negative.   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   Hemorrhoids    History of  colon polyps    History of endometriosis    and fibroids   History of hiatal hernia    History of UTI    Hypertension    Memory loss    Migraine    OSA (obstructive sleep apnea)    on nocturnal O2   Recurrent pneumonia 07/2018   Seasonal allergies    Vitamin D deficiency    Wears glasses    Past Surgical History:  Procedure Laterality Date   ABDOMINAL HYSTERECTOMY     BACK SURGERY     BIOPSY  10/20/2018   Procedure: BIOPSY;  Surgeon: [name], MD;  Location: [facility];  Service: Endoscopy;;   BLADDER SUSPENSION     COLONOSCOPY  02/16/2008   normal   DENTAL SURGERY     ESOPHAGOGASTRODUODENOSCOPY (EGD) WITH PROPOFOL N/A 10/20/2018   Procedure: ESOPHAGOGASTRODUODENOSCOPY (EGD) WITH PROPOFOL;  Surgeon: [name], MD;  Location: [facility];  Service: Endoscopy;  Laterality: N/A;   EUS N/A 10/20/2018   Procedure: UPPER ENDOSCOPIC ULTRASOUND (EUS) RADIAL;  Surgeon: [name], MD;  Location: [facility];  Service: Endoscopy;  Laterality: N/A;   FOOT SURGERY Left    x3   laparoscopy     for evaluation of endometriosis   LEFT HEART CATH AND CORONARY ANGIOGRAPHY N/A 09/04/2021   Procedure: LEFT HEART CATH AND CORONARY ANGIOGRAPHY;  Surgeon: Jettie Booze, MD;  Location: [place] [facility];  Service: Cardiovascular;  Laterality:  N/A;   TOTAL KNEE ARTHROPLASTY Left 07/15/2015   Procedure: TOTAL LEFT KNEE ARTHROPLASTY;  Surgeon: Paralee Cancel, MD;  Location: [facility];  Service: Orthopedics;  Laterality: Left;   UPPER GI ENDOSCOPY      Social History:  reports that she has never smoked. She has never used smokeless tobacco. She reports that she does not drink alcohol and does not use drugs.   Allergies  Allergen Reactions   Penicillins Shortness Of Breath and Rash    Did it involve swelling of the face/tongue/throat, SOB, or low BP? No Did it involve sudden or severe rash/hives, skin peeling, or any reaction on the inside of your mouth or nose? Yes Did you  need to seek medical attention at a hospital or doctor's office? No When did it last happen?      10 + years If all above answers are "NO", may proceed with cephalosporin use.     Morphine And Related Swelling    Sedation/Swelling described like edema/bloating/doesn't help the pain Morphine only; tolerates other opioids.    Cefepime Rash    Reaction while taking both cefepime and vancomycin at [facility] (after 3 days)   Vancomycin Rash    Reaction while taking both cefepime and vancomycin at [facility] (after 3 days)    Family History  Problem Relation Age of Onset   Emphysema Mother    Heart disease Mother 59       MI      Prior to Admission medications   Medication Sig Start Date End Date Taking? Authorizing Provider  acetaminophen (TYLENOL) 650 MG CR tablet Take 1,300 mg by mouth every 8 (eight) hours as needed for pain.    [provider]  albuterol (VENTOLIN HFA) 108 (90 Base) MCG/ACT inhaler INHALE 2 PUFFS INTO THE LUNGS EVERY 4 HOURS AS NEEDED FOR WHEEZING OR SHORTNESS OF BREATH Patient taking differently: Inhale 2 puffs into the lungs every 4 (four) hours as needed for wheezing or shortness of breath. 05/09/20   Parrett, Fonnie Mu, NP  apixaban (ELIQUIS) 5 MG TABS tablet Take 1 tablet (5 mg total) by mouth 2 (two) times daily. 09/06/21   Hosie Poisson, MD  Ascorbic Acid (VITAMIN C PO) Take 1 tablet by mouth every morning.    [provider]  benzonatate (TESSALON) 100 MG capsule Take 1 capsule (100 mg total) by mouth 3 (three) times daily as needed for cough. 09/18/21   Ailene Ards, NP  Calcium Carb-Cholecalciferol (CALCIUM PLUS VITAMIN D3) 600-500 MG-UNIT CAPS Take 2 tablets by mouth every morning.    [provider]  Cholecalciferol (VITAMIN D) 2000 units tablet Take 2,000 Units by mouth at bedtime.     [provider]  cycloSPORINE (RESTASIS) 0.05 % ophthalmic emulsion Place 1 drop into both eyes every morning.    [provider]  diazepam  (VALIUM) 5 MG tablet Take 1 tablet (5 mg total) by mouth daily as needed for anxiety or muscle spasms. 10/02/21   Plotnikov, [name], MD  diltiazem (CARDIZEM CD) 240 MG 24 hr capsule Take 1 capsule (240 mg total) by mouth every morning. 09/07/21   Hosie Poisson, MD  feeding supplement (ENSURE ENLIVE / ENSURE PLUS) LIQD Take 237 mLs by mouth 2 (two) times daily between meals. 09/06/21 10/06/21  Hosie Poisson, MD  fluticasone (FLONASE) 50 MCG/ACT nasal spray Place 2 sprays into both nostrils daily as needed for allergies or rhinitis.    [provider]  gabapentin (NEURONTIN) 300 MG capsule Take  4 capsules (1,200 mg total) by mouth at bedtime. 03/12/21 10/31/21  Kathie Dike, MD  guaiFENesin (MUCINEX) 600 MG 12 hr tablet Take 1 tablet (600 mg total) by mouth 2 (two) times daily. Patient taking differently: Take 600 mg by mouth 2 (two) times daily as needed for cough or to loosen phlegm. 03/12/21 03/12/22  Kathie Dike, MD  irbesartan (AVAPRO) 150 MG tablet Take 1 tablet (150 mg total) by mouth daily. 09/07/21   Hosie Poisson, MD  Melatonin 10 MG SUBL Place 10 mg under the tongue at bedtime.    [provider]  metoprolol tartrate (LOPRESSOR) 50 MG tablet Take 1 tablet (50 mg total) by mouth 2 (two) times daily. 09/06/21   Hosie Poisson, MD  mirabegron ER (MYRBETRIQ) 50 MG TB24 tablet Take 50 mg by mouth every morning.    [provider]  Multiple Vitamin (MULTIVITAMIN WITH MINERALS) TABS tablet Take 1 tablet by mouth 2 (two) times daily.    [provider]  OVER THE COUNTER MEDICATION Take 2 capsules by mouth in the morning. [facility]    [provider]  Oxycodone HCl 10 MG TABS Take 10 mg by mouth 3 (three) times daily. 08/19/21   [provider]  OXYGEN Inhale 1 L into the lungs See admin instructions. Overnight    [provider]  pantoprazole (PROTONIX) 40 MG tablet TAKE 1 TABLET(40 MG) BY MOUTH TWICE DAILY BEFORE A MEAL Patient taking  differently: 40 mg 2 (two) times daily before a meal. 04/09/21   Plotnikov, [name], MD  PARoxetine (PAXIL) 40 MG tablet TAKE 1 TABLET(40 MG) BY MOUTH TWICE DAILY Patient taking differently: Take 40 mg by mouth 2 (two) times daily. 05/05/21   Plotnikov, [name], MD  Polyethyl Glycol-Propyl Glycol (SYSTANE) 0.4-0.3 % GEL ophthalmic gel Place 1 application. into both eyes at bedtime.    [provider]  potassium chloride (KLOR-CON) 10 MEQ tablet TAKE 1 TABLET BY MOUTH DAILY Patient taking differently: 10 mEq every morning. 04/09/21   Plotnikov, [name], MD  Probiotic CAPS Take 1 capsule by mouth every morning.    [provider]  rosuvastatin (CRESTOR) 5 MG tablet TAKE 1 TABLET(5 MG) BY MOUTH DAILY Patient taking differently: Take 5 mg by mouth every morning. 06/23/21   Plotnikov, [name], MD  SUMAtriptan (IMITREX) 100 MG tablet TAKE 1 TABLET BY MOUTH DAILY AS NEEDED FOR MIGRAINE. MAY REPEAT IN 2 HOURS IF HEADACHE PERSISTS OR RECURS Patient taking differently: Take 100 mg by mouth See admin instructions. Take one tablet (100 mg) by mouth at onset of migraine headache, may repeat in 2 hours if headache persists or recurs 02/19/21   Plotnikov, [name], MD  tiZANidine (ZANAFLEX) 2 MG tablet Take 2 mg by mouth every 8 (eight) hours as needed. 09/30/21   [provider]  traZODone (DESYREL) 150 MG tablet TAKE 1 TABLET BY MOUTH AT BEDTIME AS NEEDED FOR SLEEP Patient taking differently: Take 150 mg by mouth at bedtime. 10/21/20   Plotnikov, [name], MD  vitamin B-12 (CYANOCOBALAMIN) 1000 MCG tablet Take 1,000 mcg by mouth every morning.    [provider]    Physical Exam: BP 122/60   Pulse 64   Temp 97.9 F (36.6 C) (Oral)   Resp 20   SpO2 91%   General: 71 y.o. year-old female well developed well nourished in no acute distress.  Somnolent and arouses to loud voices. Cardiovascular: Regular rate and rhythm with no rubs or gallops.  No thyromegaly or JVD  noted.  No lower extremity edema. 2/4 pulses in all 4 extremities. Respiratory: Clear to auscultation with no wheezes or rales. Good inspiratory effort. Abdomen: Soft nontender nondistended with normal bowel sounds x4 quadrants. Muskuloskeletal: No cyanosis, clubbing or edema noted bilaterally Neuro: CN II-XII intact, strength, sensation, reflexes Skin: No ulcerative lesions noted or rashes Psychiatry: Judgement and insight appear altered. Mood is appropriate for condition and setting.          Labs on Admission:  Basic Metabolic Panel: Recent Labs  Lab 10/04/21 1553 10/04/21 1616  NA 138 139  K 4.1 4.1  CL 102  --   CO2 24  --   GLUCOSE 152*  --   BUN 10  --   CREATININE 1.19*  --   CALCIUM 9.3  --    Liver Function Tests: No results for input(s): AST, ALT, ALKPHOS, BILITOT, PROT, ALBUMIN in the last 168 hours. No results for input(s): LIPASE, AMYLASE in the last 168 hours. No results for input(s): AMMONIA in the last 168 hours. CBC: Recent Labs  Lab 10/04/21 1616 10/04/21 1656  WBC  --  7.2  NEUTROABS  --  5.6  HGB 13.9 13.3  HCT 41.0 40.5  MCV  --  88.8  PLT  --  220   Cardiac Enzymes: No results for input(s): CKTOTAL, CKMB, CKMBINDEX, TROPONINI in the last 168 hours.  BNP (last 3 results) Recent Labs    03/09/21 1838  BNP 86.4    ProBNP (last 3 results) No results for input(s): PROBNP in the last 8760 hours.  CBG: No results for input(s): GLUCAP in the last 168 hours.  Radiological Exams on Admission: CT Head Wo Contrast  Result Date: 10/04/2021 CLINICAL DATA:  Mental status change, unknown cause unresponsive at home - recently started on A/C - evaluate for ICH EXAM: CT HEAD WITHOUT CONTRAST TECHNIQUE: Contiguous axial images were obtained from the base of the skull through the vertex without intravenous contrast. RADIATION DOSE REDUCTION: This exam was performed according to the departmental dose-optimization program which includes automated exposure  control, adjustment of the mA and/or kV according to patient size and/or use of iterative reconstruction technique. COMPARISON:  MRI head 12/28/2017 report without imaging, CT head 07/25/2020 BRAIN: BRAIN No evidence of large-territorial acute infarction. No parenchymal hemorrhage. No mass lesion. No extra-axial collection. No mass effect or midline shift. No hydrocephalus. Basilar cisterns are patent. Vascular: No hyperdense vessel. Skull: No acute fracture or focal lesion. Limited evaluation for skull base fracture due to motion artifact. The Sinuses/Orbits: Paranasal sinuses and mastoid air cells are clear. Bilateral lens replacement. The orbits are unremarkable. Other: None. IMPRESSION: No acute intracranial abnormality. Electronically Signed   By: Iven Finn M.D.   On: 10/04/2021 17:17   DG Chest Portable 1 View  Result Date: 10/04/2021 CLINICAL DATA:  Altered mental status.  Possible aspiration. EXAM: PORTABLE CHEST 1 VIEW COMPARISON:  09/02/2021 and prior radiographs FINDINGS: UPPER limits normal heart size and peribronchial thickening again noted. There is no evidence of focal airspace disease, pulmonary edema, suspicious pulmonary nodule/mass, pleural effusion, or pneumothorax. No acute bony abnormalities are identified. IMPRESSION: 1. No evidence of acute cardiopulmonary disease. 2. Chronic peribronchial thickening. Electronically Signed   By: Margarette Canada M.D.   On: 10/04/2021 16:26    EKG: I independently viewed the EKG done and my findings are as followed: Accelerated junctional rhythm.  Rate of 67.  QTc 476.  Assessment/Plan Present on Admission:  Accidental overdose  Principal Problem:   Accidental overdose  Accidental opioid overdose In the context of chronic narcotic dependence She is on 10 mg oxycodone 3 times daily PRN for chronic pain syndrome. She is also on Valium 5 mg daily PRN for anxiety Received Narcan via EMS. Gentle IV fluid hydration.  LR at 50 cc/h x 1  day  Acute on chronic hypoxic respiratory failure likely from opioid overdose At baseline she is on 2 L nasal cannula PRN Currently on 4 L to maintain O2 saturation greater than 90% Wean off oxygen supplementation as tolerated.  Elevated troponin, suspect demand ischemia in the setting of acute on chronic hypoxic respiratory failure. High-sensitivity troponin peaked at 27. No evidence of acute ischemia on twelve-lead EKG. No chest pain or anginal symptoms reported. Closely monitor on telemetry medical unit.  Chronic narcotic dependence Recommend referral to pain clinic for possible weaning. TOC consulted to assist with providing resources  Prediabetes with hyperglycemia Last hemoglobin A1c 6.1 in 2020 Obtain hemoglobin A1c Treat with insulin sliding scale.  Paroxysmal A-fib on Eliquis Resume home Eliquis Resume home Lopressor for rate control at lowest doses to avoid hypotension.  Essential hypertension, BPs are currently soft. Hold off home oral antihypertensives to avoid hypotension. Closely monitor vital signs.  MAP greater than 65.  Chronic anxiety/depression She is on 5 mg daily as needed for anxiety   Ambulatory dysfunction Uses a walker at baseline PT/OT assessment Fall precautions     DVT prophylaxis: Eliquis  Code Status: Full code  Family Communication: None at bedside  Disposition Plan: Admitted to telemetry medical unit  Consults called: None   Admission status: Observation status.   Status is: Observation    Kayleen Memos MD [facility] Pager [phone]  If 7PM-7AM, please contact night-coverage [email or web address] Password [email or web address]  10/04/2021, 11:03 PM

## 2021-10-04 NOTE — ED Provider Notes (Signed)
[facility] EMERGENCY DEPARTMENT Provider Note   CSN: [id number] Arrival date & time: 10/04/21  1541     History  Chief Complaint  Patient presents with   Drug Overdose    Bianca Haley is a 71 y.o. female with a history of recently diagnosed paroxysmal A-fib, now on Eliquis, narcotic use disorder, on oxycodone 10 mg and Valium 5 mg at home, presenting to the ED with unresponsive episode at home.  Subsequent history provided by EMS and the patient's family.  Family members report that the patient lives by herself, they went in to visit her today as they were on their way to a wedding, the patient was found unresponsive in her bed, diaphoretic, moaning and not talking to them.  She wears oxygen as needed at home and had an oxygen tank.  EMS subsequently found the patient was in a sinus rhythm, diminished GCS with pinpoint pupils, was given Narcan with rapid improvement of mental status and breathing, subsequent dilation of pupils bilaterally.  She was placed on a nonrebreather in route to the hospital.  On arrival the patient is moving, groaning, yawning, but still will not speak or provide any history to me.  Medical record review shows the patient was discharged from the hospital approximately 1 month ago, after admission for URI symptoms, new onset paroxysmal A-fib with elevated troponins felt to be secondary to the A-fib and demand ischemia.  She underwent catheterization with cardiology which showed nonobstructive cardiac disease.  PDMP review shows the patient takes 10 mg oxycodone tablets 3 times a day as needed, refilled last month, as well as Valium 5 mg tablets as needed.  Her family is not certain how she manages her medications at home and she does live by herself.  The patient's daughter and son tell me that the patient is struggled with polysubstance use problems in the past, that she has had withdrawal seizures off of her benzos, and that she has also been in  rehabilitation for polysubstance use.  HPI     Home Medications Prior to Admission medications   Medication Sig Start Date End Date Taking? Authorizing Provider  acetaminophen (TYLENOL) 650 MG CR tablet Take 1,300 mg by mouth every 8 (eight) hours as needed for pain.    [provider]  albuterol (VENTOLIN HFA) 108 (90 Base) MCG/ACT inhaler INHALE 2 PUFFS INTO THE LUNGS EVERY 4 HOURS AS NEEDED FOR WHEEZING OR SHORTNESS OF BREATH Patient taking differently: Inhale 2 puffs into the lungs every 4 (four) hours as needed for wheezing or shortness of breath. 05/09/20   Parrett, Fonnie Mu, NP  apixaban (ELIQUIS) 5 MG TABS tablet Take 1 tablet (5 mg total) by mouth 2 (two) times daily. 09/06/21   Hosie Poisson, MD  Ascorbic Acid (VITAMIN C PO) Take 1 tablet by mouth every morning.    [provider]  benzonatate (TESSALON) 100 MG capsule Take 1 capsule (100 mg total) by mouth 3 (three) times daily as needed for cough. 09/18/21   Ailene Ards, NP  Calcium Carb-Cholecalciferol (CALCIUM PLUS VITAMIN D3) 600-500 MG-UNIT CAPS Take 2 tablets by mouth every morning.    [provider]  Cholecalciferol (VITAMIN D) 2000 units tablet Take 2,000 Units by mouth at bedtime.     [provider]  cycloSPORINE (RESTASIS) 0.05 % ophthalmic emulsion Place 1 drop into both eyes every morning.    [provider]  diazepam (VALIUM) 5 MG tablet Take 1 tablet (5 mg total) by  mouth daily as needed for anxiety or muscle spasms. 10/02/21   Plotnikov, [name], MD  diltiazem (CARDIZEM CD) 240 MG 24 hr capsule Take 1 capsule (240 mg total) by mouth every morning. 09/07/21   Hosie Poisson, MD  feeding supplement (ENSURE ENLIVE / ENSURE PLUS) LIQD Take 237 mLs by mouth 2 (two) times daily between meals. 09/06/21 10/06/21  Hosie Poisson, MD  fluticasone (FLONASE) 50 MCG/ACT nasal spray Place 2 sprays into both nostrils daily as needed for allergies or rhinitis.    [provider]  gabapentin  (NEURONTIN) 300 MG capsule Take 4 capsules (1,200 mg total) by mouth at bedtime. 03/12/21 10/31/21  Kathie Dike, MD  guaiFENesin (MUCINEX) 600 MG 12 hr tablet Take 1 tablet (600 mg total) by mouth 2 (two) times daily. Patient taking differently: Take 600 mg by mouth 2 (two) times daily as needed for cough or to loosen phlegm. 03/12/21 03/12/22  Kathie Dike, MD  irbesartan (AVAPRO) 150 MG tablet Take 1 tablet (150 mg total) by mouth daily. 09/07/21   Hosie Poisson, MD  Melatonin 10 MG SUBL Place 10 mg under the tongue at bedtime.    [provider]  metoprolol tartrate (LOPRESSOR) 50 MG tablet Take 1 tablet (50 mg total) by mouth 2 (two) times daily. 09/06/21   Hosie Poisson, MD  mirabegron ER (MYRBETRIQ) 50 MG TB24 tablet Take 50 mg by mouth every morning.    [provider]  Multiple Vitamin (MULTIVITAMIN WITH MINERALS) TABS tablet Take 1 tablet by mouth 2 (two) times daily.    [provider]  OVER THE COUNTER MEDICATION Take 2 capsules by mouth in the morning. [facility]    [provider]  Oxycodone HCl 10 MG TABS Take 10 mg by mouth 3 (three) times daily. 08/19/21   [provider]  OXYGEN Inhale 1 L into the lungs See admin instructions. Overnight    [provider]  pantoprazole (PROTONIX) 40 MG tablet TAKE 1 TABLET(40 MG) BY MOUTH TWICE DAILY BEFORE A MEAL Patient taking differently: 40 mg 2 (two) times daily before a meal. 04/09/21   Plotnikov, [name], MD  PARoxetine (PAXIL) 40 MG tablet TAKE 1 TABLET(40 MG) BY MOUTH TWICE DAILY Patient taking differently: Take 40 mg by mouth 2 (two) times daily. 05/05/21   Plotnikov, [name], MD  Polyethyl Glycol-Propyl Glycol (SYSTANE) 0.4-0.3 % GEL ophthalmic gel Place 1 application. into both eyes at bedtime.    [provider]  potassium chloride (KLOR-CON) 10 MEQ tablet TAKE 1 TABLET BY MOUTH DAILY Patient taking differently: 10 mEq every morning. 04/09/21   Plotnikov, [name], MD   Probiotic CAPS Take 1 capsule by mouth every morning.    [provider]  rosuvastatin (CRESTOR) 5 MG tablet TAKE 1 TABLET(5 MG) BY MOUTH DAILY Patient taking differently: Take 5 mg by mouth every morning. 06/23/21   Plotnikov, [name], MD  SUMAtriptan (IMITREX) 100 MG tablet TAKE 1 TABLET BY MOUTH DAILY AS NEEDED FOR MIGRAINE. MAY REPEAT IN 2 HOURS IF HEADACHE PERSISTS OR RECURS Patient taking differently: Take 100 mg by mouth See admin instructions. Take one tablet (100 mg) by mouth at onset of migraine headache, may repeat in 2 hours if headache persists or recurs 02/19/21   Plotnikov, [name], MD  tiZANidine (ZANAFLEX) 2 MG tablet Take 2 mg by mouth every 8 (eight) hours as needed. 09/30/21   [provider]  traZODone (DESYREL) 150 MG tablet TAKE 1 TABLET BY MOUTH AT BEDTIME  AS NEEDED FOR SLEEP Patient taking differently: Take 150 mg by mouth at bedtime. 10/21/20   Plotnikov, [name], MD  vitamin B-12 (CYANOCOBALAMIN) 1000 MCG tablet Take 1,000 mcg by mouth every morning.    [provider]      Allergies    Penicillins, Morphine and related, Cefepime, and Vancomycin    Review of Systems   Review of Systems  Physical Exam Updated Vital Signs BP (!) 113/58   Pulse 63   Temp 97.9 F (36.6 C) (Oral)   Resp 17   SpO2 93%  Physical Exam Constitutional:      General: She is not in acute distress.    Comments: Patient yawning, groaning  HENT:     Head: Normocephalic and atraumatic.  Eyes:     Conjunctiva/sclera: Conjunctivae normal.     Pupils: Pupils are equal, round, and reactive to light.     Comments: Pupils dilated bilaterally  Cardiovascular:     Rate and Rhythm: Normal rate and regular rhythm.     Pulses: Normal pulses.  Pulmonary:     Effort: Pulmonary effort is normal. No respiratory distress.     Comments: Expiratory wheezing bilaterally Abdominal:     General: There is no distension.     Tenderness: There is no abdominal tenderness.   Skin:    General: Skin is warm and dry.  Neurological:     Mental Status: She is alert.     Comments: Patient moving all extremities    ED Results / Procedures / Treatments   Labs (all labs ordered are listed, but only abnormal results are displayed) Labs Reviewed  BASIC METABOLIC PANEL - Abnormal; Notable for the following components:      Result Value   Glucose, Bld 152 (*)    Creatinine, Ser 1.19 (*)    GFR, Estimated 48 (*)    All other components within normal limits  I-STAT VENOUS BLOOD GAS, ED - Abnormal; Notable for the following components:   pCO2, Ven 42.2 (*)    Calcium, Ion 1.11 (*)    All other components within normal limits  TROPONIN I (HIGH SENSITIVITY) - Abnormal; Notable for the following components:   Troponin I (High Sensitivity) 18 (*)    All other components within normal limits  TROPONIN I (HIGH SENSITIVITY) - Abnormal; Notable for the following components:   Troponin I (High Sensitivity) 27 (*)    All other components within normal limits  CBC WITH DIFFERENTIAL/PLATELET  CBC WITH DIFFERENTIAL/PLATELET  RAPID URINE DRUG SCREEN, HOSP PERFORMED    EKG EKG Interpretation  Date/Time:  Saturday October 04 2021 15:59:17 EDT Ventricular Rate:  67 PR Interval:    QRS Duration: 93 QT Interval:  450 QTC Calculation: 476 R Axis:   83 Text Interpretation: Accelerated junctional rhythm vs A Fib Borderline right axis deviation Consider left ventricular hypertrophy Nonspecific T abnrm, anterolateral leads Confirmed by Octaviano Glow [phone]) on 10/04/2021 4:03:04 PM  Radiology CT Head Wo Contrast  Result Date: 10/04/2021 CLINICAL DATA:  Mental status change, unknown cause unresponsive at home - recently started on A/C - evaluate for ICH EXAM: CT HEAD WITHOUT CONTRAST TECHNIQUE: Contiguous axial images were obtained from the base of the skull through the vertex without intravenous contrast. RADIATION DOSE REDUCTION: This exam was performed according to the  departmental dose-optimization program which includes automated exposure control, adjustment of the mA and/or kV according to patient size and/or use of iterative reconstruction technique. COMPARISON:  MRI head 12/28/2017 report without imaging,  CT head 07/25/2020 BRAIN: BRAIN No evidence of large-territorial acute infarction. No parenchymal hemorrhage. No mass lesion. No extra-axial collection. No mass effect or midline shift. No hydrocephalus. Basilar cisterns are patent. Vascular: No hyperdense vessel. Skull: No acute fracture or focal lesion. Limited evaluation for skull base fracture due to motion artifact. The Sinuses/Orbits: Paranasal sinuses and mastoid air cells are clear. Bilateral lens replacement. The orbits are unremarkable. Other: None. IMPRESSION: No acute intracranial abnormality. Electronically Signed   By: Iven Finn M.D.   On: 10/04/2021 17:17   DG Chest Portable 1 View  Result Date: 10/04/2021 CLINICAL DATA:  Altered mental status.  Possible aspiration. EXAM: PORTABLE CHEST 1 VIEW COMPARISON:  09/02/2021 and prior radiographs FINDINGS: UPPER limits normal heart size and peribronchial thickening again noted. There is no evidence of focal airspace disease, pulmonary edema, suspicious pulmonary nodule/mass, pleural effusion, or pneumothorax. No acute bony abnormalities are identified. IMPRESSION: 1. No evidence of acute cardiopulmonary disease. 2. Chronic peribronchial thickening. Electronically Signed   By: Margarette Canada M.D.   On: 10/04/2021 16:26    Procedures .Critical Care Performed by: Wyvonnia Dusky, MD Authorized by: Wyvonnia Dusky, MD   Critical care provider statement:    Critical care time (minutes):  45   Critical care time was exclusive of:  Separately billable procedures and treating other patients   Critical care was necessary to treat or prevent imminent or life-threatening deterioration of the following conditions:  Toxidrome   Critical care was time spent  personally by me on the following activities:  Ordering and performing treatments and interventions, ordering and review of laboratory studies, ordering and review of radiographic studies, pulse oximetry, review of old charts, examination of patient and evaluation of patient's response to treatment    Medications Ordered in ED Medications  lactated ringers infusion (has no administration in time range)  apixaban (ELIQUIS) tablet 5 mg (has no administration in time range)  sodium chloride 0.9 % bolus 1,000 mL (0 mLs Intravenous Stopped 10/04/21 1740)  ondansetron (ZOFRAN) injection 4 mg (4 mg Intravenous Given 10/04/21 1615)    ED Course/ Medical Decision Making/ A&P Clinical Course as of 10/04/21 2340  Sat Oct 04, 2021  1700 Patient reassessed, O2 90% on 3L  - pending CT scan -she is grunting "no" to questions if she is in pain or having a headache.  She is still not communicating otherwise.  I have asked CT to expedite a brain scan.  I do understand there is going to be some motion artifact, as she is moving, but I would prefer not to give her any further sedation. [MT]  3244 [place] troponins are low and fairly flat given her recent hospital elevations - this seems inconsistent with ACS or PE. [MT]  1841 Patient now awake and verbalizing with me, wean to 2L [place] [MT]  2106 Patient reassessed again, now awake, does continue to appear mildly dazed but is answering questions appropriately.  She may need a little more time to finish metabolizing her earlier medications, but then we will try to ambulate.  She reports to me that she does wear 1 to 2 L of oxygen at home which is where she is at now [MT]  2222 On reassessment patient was not able ot steadily ambulate, seemed very dazed, answering questions confusedly, and is requiring 4L [place] while sleeping. I do not think there is a reasonably safe discharge plan for home as she lives by herself.  We will admit for medical observation  overnight. [MT]  2248  Admitted to Dr [facility] hospitalist.  Daughter updated Lysbeth Galas) with plan for observation and in agreement [MT]    Clinical Course User Index [MT] Laiken Nohr, Carola Rhine, MD                           Medical Decision Making Amount and/or Complexity of Data Reviewed Labs: ordered. Radiology: ordered. ECG/medicine tests: ordered.  Risk Prescription drug management. Decision regarding hospitalization.   This patient presents to the Emergency Department with complaint of altered mental status.  This involves an extensive number of treatment options, and is a complaint that carries with it a high risk of complications and morbidity.  The differential diagnosis includes hypoglycemia vs metabolic encephalopathy vs infection (including cystitis) vs ICH vs stroke vs polypharmacy vs other  Suspect this is most likely polypharmacy related to opioid use, given the clinical presentation of the field and improvement after Narcan.  We will obtain a chest x-ray to evaluate for aspiration.  We will continue monitor the patient in the ED.  Zofran ordered for nausea to prevent vomiting.  She will need a CT scan of her head as she was recently started on a blood thinner, although there was no traumatic mechanism reported by family, as the patient was found lying in her bed at home.  Glucose was normal in the field 180.  No reported history of diabetes or hypoglycemia  I ordered, reviewed, and interpreted labs, including Trop mildly elevated but at baseline, no acute anemia, no acidosis, pco2 42, CBC unremarkable.  UDS pending I ordered medication IV fluids, IV zofran I ordered imaging studies which included x-ray of the chest, CT of the head  I independently visualized and interpreted imaging which showed no ICH, no acute PNA or aspiration,  and the monitor tracing which showed NSR Additional history was obtained from EMS, patient's son and daughter External records reviewed including recent hospital course and  discharge summary as noted above I personally reviewed the patients ECG which showed A-fib versus accelerated junctional rhythm with no acute ischemic findings          Final Clinical Impression(s) / ED Diagnoses Final diagnoses:  Opiate overdose, undetermined intent, initial encounter [facility])  Hypoxia    Rx / DC Orders ED Discharge Orders     None         Wyvonnia Dusky, MD 10/04/21 2340

## 2021-10-04 NOTE — ED Notes (Signed)
Patient transported to CT 

## 2021-10-04 NOTE — ED Triage Notes (Signed)
Pt found unresponsive at home, unknown amount of downtime. Pt has oxycodone script and is suspected to have taken an unknown amount, bottle was not found on site. EMS reported shallow breathing and administered '2mg'$  IM of narcan. Pt became responsive and reactive. En route pt was given another '1mg'$  of narcan. Upon arrival pt is restless and disoriented, incomprehensible.

## 2021-10-05 ENCOUNTER — Observation Stay (HOSPITAL_COMMUNITY): Payer: Medicare Other

## 2021-10-05 DIAGNOSIS — I480 Paroxysmal atrial fibrillation: Secondary | ICD-10-CM | POA: Diagnosis present

## 2021-10-05 DIAGNOSIS — Z885 Allergy status to narcotic agent status: Secondary | ICD-10-CM | POA: Diagnosis not present

## 2021-10-05 DIAGNOSIS — Z9981 Dependence on supplemental oxygen: Secondary | ICD-10-CM

## 2021-10-05 DIAGNOSIS — R7303 Prediabetes: Secondary | ICD-10-CM | POA: Diagnosis present

## 2021-10-05 DIAGNOSIS — T40601A Poisoning by unspecified narcotics, accidental (unintentional), initial encounter: Secondary | ICD-10-CM | POA: Diagnosis present

## 2021-10-05 DIAGNOSIS — Z79899 Other long term (current) drug therapy: Secondary | ICD-10-CM | POA: Diagnosis not present

## 2021-10-05 DIAGNOSIS — E785 Hyperlipidemia, unspecified: Secondary | ICD-10-CM | POA: Diagnosis present

## 2021-10-05 DIAGNOSIS — I080 Rheumatic disorders of both mitral and aortic valves: Secondary | ICD-10-CM | POA: Diagnosis present

## 2021-10-05 DIAGNOSIS — N1832 Chronic kidney disease, stage 3b: Secondary | ICD-10-CM | POA: Diagnosis present

## 2021-10-05 DIAGNOSIS — T50901A Poisoning by unspecified drugs, medicaments and biological substances, accidental (unintentional), initial encounter: Secondary | ICD-10-CM | POA: Diagnosis not present

## 2021-10-05 DIAGNOSIS — K219 Gastro-esophageal reflux disease without esophagitis: Secondary | ICD-10-CM | POA: Diagnosis present

## 2021-10-05 DIAGNOSIS — R262 Difficulty in walking, not elsewhere classified: Secondary | ICD-10-CM | POA: Diagnosis not present

## 2021-10-05 DIAGNOSIS — T402X1A Poisoning by other opioids, accidental (unintentional), initial encounter: Secondary | ICD-10-CM | POA: Diagnosis present

## 2021-10-05 DIAGNOSIS — F419 Anxiety disorder, unspecified: Secondary | ICD-10-CM | POA: Diagnosis not present

## 2021-10-05 DIAGNOSIS — R269 Unspecified abnormalities of gait and mobility: Secondary | ICD-10-CM | POA: Diagnosis present

## 2021-10-05 DIAGNOSIS — J9621 Acute and chronic respiratory failure with hypoxia: Secondary | ICD-10-CM | POA: Diagnosis present

## 2021-10-05 DIAGNOSIS — G4733 Obstructive sleep apnea (adult) (pediatric): Secondary | ICD-10-CM | POA: Diagnosis present

## 2021-10-05 DIAGNOSIS — F32A Depression, unspecified: Secondary | ICD-10-CM | POA: Diagnosis present

## 2021-10-05 DIAGNOSIS — I129 Hypertensive chronic kidney disease with stage 1 through stage 4 chronic kidney disease, or unspecified chronic kidney disease: Secondary | ICD-10-CM | POA: Diagnosis present

## 2021-10-05 DIAGNOSIS — Z881 Allergy status to other antibiotic agents status: Secondary | ICD-10-CM | POA: Diagnosis not present

## 2021-10-05 DIAGNOSIS — F1120 Opioid dependence, uncomplicated: Secondary | ICD-10-CM | POA: Diagnosis present

## 2021-10-05 DIAGNOSIS — G894 Chronic pain syndrome: Secondary | ICD-10-CM | POA: Diagnosis present

## 2021-10-05 DIAGNOSIS — I10 Essential (primary) hypertension: Secondary | ICD-10-CM | POA: Diagnosis not present

## 2021-10-05 DIAGNOSIS — I248 Other forms of acute ischemic heart disease: Secondary | ICD-10-CM | POA: Diagnosis present

## 2021-10-05 DIAGNOSIS — Z96652 Presence of left artificial knee joint: Secondary | ICD-10-CM | POA: Diagnosis present

## 2021-10-05 DIAGNOSIS — Z7901 Long term (current) use of anticoagulants: Secondary | ICD-10-CM | POA: Diagnosis not present

## 2021-10-05 DIAGNOSIS — H9190 Unspecified hearing loss, unspecified ear: Secondary | ICD-10-CM | POA: Diagnosis present

## 2021-10-05 DIAGNOSIS — R0902 Hypoxemia: Secondary | ICD-10-CM | POA: Diagnosis present

## 2021-10-05 DIAGNOSIS — Z880 Allergy status to penicillin: Secondary | ICD-10-CM | POA: Diagnosis not present

## 2021-10-05 LAB — COMPREHENSIVE METABOLIC PANEL
ALT: 14 U/L (ref 0–44)
AST: 24 U/L (ref 15–41)
Albumin: 3.3 g/dL — ABNORMAL LOW (ref 3.5–5.0)
Alkaline Phosphatase: 74 U/L (ref 38–126)
Anion gap: 9 (ref 5–15)
BUN: 10 mg/dL (ref 8–23)
CO2: 26 mmol/L (ref 22–32)
Calcium: 8.7 mg/dL — ABNORMAL LOW (ref 8.9–10.3)
Chloride: 105 mmol/L (ref 98–111)
Creatinine, Ser: 0.95 mg/dL (ref 0.44–1.00)
GFR, Estimated: >60 mL/min (ref >60)
Glucose, Bld: 102 mg/dL — ABNORMAL HIGH (ref 70–99)
Potassium: 4.2 mmol/L (ref 3.5–5.1)
Sodium: 140 mmol/L (ref 135–145)
Total Bilirubin: 0.7 mg/dL (ref 0.3–1.2)
Total Protein: 6.1 g/dL — ABNORMAL LOW (ref 6.5–8.1)

## 2021-10-05 LAB — RAPID URINE DRUG SCREEN, HOSP PERFORMED
Amphetamines: NOT DETECTED
Barbiturates: NOT DETECTED
Benzodiazepines: POSITIVE — AB
Cocaine: NOT DETECTED
Opiates: POSITIVE — AB
Tetrahydrocannabinol: NOT DETECTED

## 2021-10-05 LAB — MAGNESIUM: Magnesium: 2 mg/dL (ref 1.7–2.4)

## 2021-10-05 LAB — GLUCOSE, CAPILLARY: Glucose-Capillary: 103 mg/dL — ABNORMAL HIGH (ref 70–99)

## 2021-10-05 LAB — PHOSPHORUS: Phosphorus: 4 mg/dL (ref 2.5–4.6)

## 2021-10-05 MED ORDER — ACETAMINOPHEN 325 MG PO TABS
650.0000 mg | ORAL_TABLET | Freq: Four times a day (QID) | ORAL | Status: DC | PRN
  Administered 2021-10-05: 650 mg via ORAL
  Filled 2021-10-05: qty 2

## 2021-10-05 MED ORDER — PANTOPRAZOLE SODIUM 40 MG PO TBEC
40.0000 mg | DELAYED_RELEASE_TABLET | Freq: Two times a day (BID) | ORAL | Status: DC
  Administered 2021-10-05 – 2021-10-08 (×7): 40 mg via ORAL
  Filled 2021-10-05 (×7): qty 1

## 2021-10-05 MED ORDER — POLYETHYLENE GLYCOL 3350 17 G PO PACK: 17.0000 g | PACK | Freq: Every day | ORAL | Status: DC | PRN

## 2021-10-05 MED ORDER — BENZONATATE 100 MG PO CAPS
100.0000 mg | ORAL_CAPSULE | Freq: Three times a day (TID) | ORAL | Status: DC | PRN
  Administered 2021-10-05 (×2): 100 mg via ORAL
  Filled 2021-10-05 (×2): qty 1

## 2021-10-05 MED ORDER — MELATONIN 3 MG PO TABS
3.0000 mg | ORAL_TABLET | Freq: Every evening | ORAL | Status: DC | PRN
  Administered 2021-10-07: 3 mg via ORAL
  Filled 2021-10-05 (×2): qty 1

## 2021-10-05 MED ORDER — PROCHLORPERAZINE EDISYLATE 10 MG/2ML IJ SOLN
10.0000 mg | Freq: Four times a day (QID) | INTRAMUSCULAR | Status: DC | PRN
  Administered 2021-10-07 – 2021-10-08 (×3): 10 mg via INTRAVENOUS
  Filled 2021-10-05 (×4): qty 2

## 2021-10-05 MED ORDER — METOPROLOL TARTRATE 12.5 MG HALF TABLET
12.5000 mg | ORAL_TABLET | Freq: Two times a day (BID) | ORAL | Status: DC
  Administered 2021-10-06 – 2021-10-07 (×2): 12.5 mg via ORAL
  Filled 2021-10-05 (×2): qty 1

## 2021-10-05 MED ORDER — SUMATRIPTAN SUCCINATE 100 MG PO TABS
100.0000 mg | ORAL_TABLET | ORAL | Status: DC | PRN
Start: 2021-10-05 — End: 2021-10-08
  Administered 2021-10-05 – 2021-10-08 (×3): 100 mg via ORAL
  Filled 2021-10-05 (×4): qty 1

## 2021-10-05 MED ORDER — ROSUVASTATIN CALCIUM 5 MG PO TABS
5.0000 mg | ORAL_TABLET | Freq: Every morning | ORAL | Status: DC
  Administered 2021-10-05 – 2021-10-08 (×4): 5 mg via ORAL
  Filled 2021-10-05 (×4): qty 1

## 2021-10-05 MED ORDER — ALBUTEROL SULFATE (2.5 MG/3ML) 0.083% IN NEBU: 3.0000 mL | INHALATION_SOLUTION | RESPIRATORY_TRACT | Status: DC | PRN

## 2021-10-05 MED ORDER — SUMATRIPTAN SUCCINATE 100 MG PO TABS: 100.0000 mg | ORAL_TABLET | ORAL | Status: DC

## 2021-10-05 MED ORDER — IOHEXOL 350 MG/ML SOLN
72.0000 mL | Freq: Once | INTRAVENOUS | Status: AC | PRN
  Administered 2021-10-05: 72 mL via INTRAVENOUS

## 2021-10-05 MED ORDER — PAROXETINE HCL 20 MG PO TABS
40.0000 mg | ORAL_TABLET | Freq: Two times a day (BID) | ORAL | Status: DC
  Administered 2021-10-05 – 2021-10-08 (×6): 40 mg via ORAL
  Filled 2021-10-05 (×7): qty 2

## 2021-10-05 MED ORDER — DIAZEPAM 2 MG PO TABS
2.0000 mg | ORAL_TABLET | Freq: Every day | ORAL | Status: DC | PRN
  Administered 2021-10-07 – 2021-10-08 (×3): 2 mg via ORAL
  Filled 2021-10-05 (×3): qty 1

## 2021-10-05 NOTE — ED Notes (Signed)
Called pharmacy for imitrex.

## 2021-10-05 NOTE — Assessment & Plan Note (Signed)
Mild Watch for sx, presentation not c/w ACS

## 2021-10-05 NOTE — Assessment & Plan Note (Signed)
paxil Valium at reduced dose

## 2021-10-05 NOTE — Discharge Instructions (Signed)
Substance Abuse Resources                  Intensive Outpatient Programs  [facility]    [facility] [address]     [place] [address] [place],  [place], [place]      [place]  (Inpatient and outpatient)  [phone] (Suboxone and Methadone) [address]           [phone]           ADS: Alcohol & Drug Services    [facility] - Intensive Outpatient [address]     [address] [place] [address] [place], [place] [address]     [place], [place]      [phone]  [facility] (Outpatient, Inpatient, Chemical  [facility] (Groups and Residental) (insurance only) [phone]    [place], [place]       [facility]    [facility] (for caregivers and family) [address]     [address] [place], [place], [place]      [phone]  Residential Treatment Programs  [place]  [place](2 years) Residential: 4 days)  [facility] ([place], [place], [place] [phone]      [phone] or [phone]  [facility]    [facility] [address]      [place], [place], [place]      [phone]  [place]   [facility] Services (RTS) [place]     [address] [place], [place] [address]     [place], [place]      [phone] Admissions: 8am-3pm M-F  [facility] Program: Residential Program [phone] Days)              ADATC: [facility]  [place], [place], [place] or [phone]    (Walk in Hours over the weekend or by referral)   [facility] Crisis: Therapeutic  Alternatives:[phone] (for crisis response 24 hours a day)

## 2021-10-05 NOTE — Progress Notes (Signed)
Physical Therapy Note  (Full PT eval to follow)  SATURATION QUALIFICATIONS: (This note is used to comply with regulatory documentation for home oxygen)  Patient Saturations on Room Air at Rest = 87%  Patient Saturations on Room Air while Ambulating = 79%  Patient Saturations on 4 Liters of oxygen while Ambulating = 92%  Please briefly explain why patient needs home oxygen: Patient requires supplemental oxygen to maintain oxygen saturations at acceptable, safe levels with physical activity.  Roney Marion, [place] Office [phone]

## 2021-10-05 NOTE — TOC Initial Note (Signed)
Transition of Care [facility]) - Initial/Assessment Note    Patient Details  Name: Bianca Haley MRN: [id number] Date of Birth: January 04, 1951  Transition of [facility]) CM/SW Contact:    Verdell Carmine, RN Phone Number: 10/05/2021, 10:21 AM  Clinical Narrative:                 Patient comes from home, lives by self. Presented via [facility] for unresponsive, improved after narcan. Family found her mostly unresponsive in bed when they checked on her. She received narcan with improvement in mentation. Is complaining of migraine headache this AM.  She could use HH RN for medication management. PT and OT evaluation pending CM will follow for needs, recommendations, and transitions  Expected Discharge Plan: [place] Barriers to Discharge: Continued Medical Work up   Patient Goals and CMS Choice        Expected Discharge Plan and Services Expected Discharge Plan: [place]   Discharge Planning Services: CM Consult                               HH Arranged: RN          Prior Living Arrangements/Services   Lives with:: Self Patient language and need for interpreter reviewed:: Yes        Need for Family Participation in Patient Care: Yes (Comment) Care giver support system in place?: Yes (comment)   Criminal Activity/Legal Involvement Pertinent to Current Situation/Hospitalization: No - Comment as needed  Activities of Daily Living      Permission Sought/Granted                  Emotional Assessment   Attitude/Demeanor/Rapport: Reactive   Orientation: : Oriented to Place, Oriented to Self Alcohol / Substance Use: Other (comment) (Opiate use) Psych Involvement: No (comment)  Admission diagnosis:  Accidental overdose [T50.901A] Patient Active Problem List   Diagnosis Date Noted   Accidental overdose 10/04/2021   Subclinical hyperthyroidism 09/18/2021   Elevated troponin    Dyspnea 09/03/2021   PAF (paroxysmal atrial  fibrillation) ([place]) 09/02/2021   Neck pain 04/21/2021   Chronic hypoxemic respiratory failure ([place]) 03/23/2021   Insomnia 03/23/2021   Grief reaction 03/23/2021   Pneumonia of left lower lobe due to infectious organism 03/09/2021   Hyponatremia 03/09/2021   Chronic diastolic CHF (congestive heart failure) ([place]) 03/09/2021   Acute on chronic respiratory failure with hypoxia ([place]) 03/09/2021   Anxiety disorder 10/02/2020   Atherosclerosis of aorta ([place]) 10/02/2020   Actinic keratosis 07/31/2020   AKI (acute kidney injury) ([place]) 07/26/2020   Encephalopathy acute 07/26/2020   Acute encephalopathy 07/25/2020   DOE (dyspnea on exertion) 01/30/2019   Sleep disorder 01/30/2019   Dilated bile duct    Pancreatic duct dilated    History of lumbar fusion 09/20/2018   Dilation of biliary tract    Hyperglycemia 06/23/2018   Memory loss 03/12/2017   Left foot infection 02/08/2017   Deep incisional surgical site infection 11/09/2016   Allergic reaction caused by a drug 11/06/2016   Fever 09/16/2016   Body aches 09/16/2016   Cough 09/16/2016   Edema 09/15/2016   URI (upper respiratory infection) 08/14/2016   Cerumen impaction 06/11/2016   Posterior tibial tendon dysfunction (PTTD) of left lower extremity 04/13/2016   Dyslipidemia 09/25/2015   S/P left TKA 07/15/2015   S/P knee replacement 07/15/2015   Esophageal dysphagia 09/18/2014   GERD  without esophagitis 09/04/2014   Chronic narcotic dependence ([place]) 09/04/2014   Gross hematuria 08/31/2014   Hemorrhagic cystitis 08/24/2014   Bilateral lower extremity edema 04/23/2014   Cervical radiculopathy 04/02/2014   Osteoarthritis of left knee 10/25/2013   Knee pain, left 10/13/2013   Left leg swelling 10/13/2013   Lumbar radiculopathy 10/11/2013   Chronic neck and back pain 09/28/2013   Hypokalemia 09/28/2013   Abnormal CT scan, head 09/27/2013   Nocturnal hypoxia 38/18/2993   Diastolic dysfunction- grade I 02/24/2013   Recurrent  pneumonia 02/21/2013   Chronic cough 01/04/2013   Dupuytren's contracture 03/25/2012   Contracture of palmar fascia (Dupuytren's) 08/04/2011   VENOUS INSUFFICIENCY, LEFT LEG 05/14/2010   CRI (chronic renal insufficiency), stage 3 (moderate) ([place]) 12/02/2009   INGUINAL PAIN, RIGHT 12/02/2009   UNSPECIFIED ANEMIA 11/28/2009   PULMONARY NODULE 05/10/2008   FIBROIDS, UTERUS 08/03/2007   MIGRAINE HEADACHE 08/03/2007   TINNITUS, LEFT 08/03/2007   Essential hypertension 08/03/2007   ENDOMETRIOSIS 08/03/2007   ALCOHOL ABUSE, HX OF 08/03/2007   MEASLES, HX OF 08/03/2007   Personal history of urinary disorder 08/03/2007   PCP:  [name], MD Pharmacy:   [facility] [place], [place] [address] [place] [address] Phone: [phone] Fax: [phone]     Social Determinants of Health (SDOH) Interventions    Readmission Risk Interventions    09/05/2021    4:17 PM 03/11/2021   10:22 AM  Readmission Risk Prevention Plan  Transportation Screening Complete Complete  PCP or Specialist Appt within 3-5 Days Complete Complete  HRI or Home Care Consult Complete Complete  Social Work Consult for [place] Planning/Counseling Complete Complete  Palliative Care Screening Not Applicable Complete  Medication Review Press photographer) Complete Complete

## 2021-10-05 NOTE — Progress Notes (Signed)
OT Cancellation Note  Patient Details Name: Bianca Haley MRN: [id number] DOB: 08/25/1950   Cancelled Treatment:    Reason Eval/Treat Not Completed: Other (comment). Pt just recently finished working with PT has a horrible migraine, meds for this recently given. Will check back a little later today on patient.  [place], OTR/L [facility] Re[facility] [phone] Office [phone]    Almon Register 10/05/2021, 9:30 AM

## 2021-10-05 NOTE — ED Notes (Signed)
Bag of medications give to sister, Jenny Reichmann.

## 2021-10-05 NOTE — Assessment & Plan Note (Addendum)
Typically uses oxygen only at night, 1 L she notes Wean as tolerated - she's doing well on RA today with activity CT PE protocol with bronchial wall thickening with endobronchial debris and multifocal hypoenhancing consolidative opacities (infection vs aspiration) Needs follow up CT scan in 3 months to assess for resolution Interval increase in size of multiple mediastinal LN's, trace R effusion Given hx, afebrile, normal white count, opiate overdose, suspect aspiration - will hold off on abx

## 2021-10-05 NOTE — Progress Notes (Signed)
   10/05/21 2040  Oxygen Therapy  SpO2 (!) 89 %  O2 Device Room Air  Patient Activity (if Appropriate) In bed  Pulse Oximetry Type Intermittent   Oxygen restarted at 2 LPM. Saturating at 92-94%at present.

## 2021-10-05 NOTE — Assessment & Plan Note (Signed)
Lower dose metoprolol resumed Holding dilt, irbesartan for now

## 2021-10-05 NOTE — Assessment & Plan Note (Signed)
PT/OT

## 2021-10-05 NOTE — ED Notes (Signed)
Pt's O2 sats 88% on 3L, increased to 4L [place] with sats now 90-91%.

## 2021-10-05 NOTE — Plan of Care (Signed)
  Problem: Education: Goal: Knowledge of General Education information will improve Description: Including pain rating scale, medication(s)/side effects and non-pharmacologic comfort measures Outcome: Completed/Met

## 2021-10-05 NOTE — Assessment & Plan Note (Addendum)
On many potentially sedating drugs Tizanidine is new as of about 1 week? Potential contributor Also on high dose gabapentin at night (family notes she tolerates this well and longstanding) Trazodone Valium Oxycodone (recently discontinued opana, though she had Lether Tesch few pills left - family going to help her get rid of theses) Would recommend reducing or discontinuing as many of these as able -> would recommend benzo taper, stop tizanidine, reduce dose (with eventual plan for discontinuation of oxycodone) - Tiye Huwe lot of these changes would be deferred to her outpatient providers - called Dr. Posey Rea and left my phone number to discuss)

## 2021-10-05 NOTE — Assessment & Plan Note (Addendum)
uds with opiates/benzos She's on many potentially sedating meds Has oxycodone prescribed, recently switched from opana (still had some opana in her bag, not clear if she took this by accident or took more oxycodone than prescribed?) Resume low dose oxy to avoid withdrawal sx (c/o mild abdominal discomfort, frequent BM this AM), will follow  Recommend holding for now, would recommend holding going forward or at least dose reduction given lack of clarity regarding presentation

## 2021-10-05 NOTE — Hospital Course (Signed)
Bianca Haley is Bianca Haley 71 y.o. female with medical history significant for paroxysmal Bianca Haley on Eliquis, CKD 3B, essential hypertension, hyperlipidemia, chronic narcotic dependence, moderate mitral regurgitation, moderate aortic regurgitation, chronic hypoxia on 2 L nasal cannula PRN, who presented to [facility] ED after being found unresponsive in her bed at her apartment.  Suspected accidental opioid overdose.  The patient is on home oxycodone 10 mg TID PRN as well as Valium 5 mg daily PRN.  She received Narcan via EMS with good response.  She was brought into the ED for further evaluation.  In the ED, she is somnolent with some confusion.  CT head non acute.  High sensitivity troponin mildly elevated and peaked at 27.  No evidence of acute ischemia on 12 lead EKG and no reported chest pain.  EDP recommended admission for observation.  The patient was admitted by [facility], hospitalist service.

## 2021-10-05 NOTE — Assessment & Plan Note (Signed)
eliquis Metoprolol

## 2021-10-05 NOTE — Assessment & Plan Note (Signed)
Pending a1c 6.1 in 2020

## 2021-10-05 NOTE — Evaluation (Signed)
Physical Therapy Evaluation Patient Details Name: Bianca Haley MRN: [id number] DOB: 12-17-1950 Today's Date: 10/05/2021  History of Present Illness  Bianca Haley is a 71 y.o. female with medical history significant for paroxysmal A-fib on Eliquis, CKD 3B, essential hypertension, hyperlipidemia, chronic narcotic dependence, moderate mitral regurgitation, moderate aortic regurgitation, chronic hypoxia on 2 L nasal cannula PRN, who presented to [facility] ED after being found unresponsive in her bed at her apartment.  Suspected accidental opioid overdose.  The patient is on home oxycodone 10 mg TID PRN as well as Valium 5 mg daily PRN.  She received Narcan via EMS with good response.  Clinical Impression   Pt admitted with above diagnosis. Lives at home alone, in apartment with elevator access; Prior to admission in May, pt was managing independently, driving, shopping etc; Therapies recommended 24 hour supervision for safety due to cognitive deficits at that last admission; Today, on PT eval, unable to get a good read as to whether she had supervision/assist in the time between last admission and this admission; Presents to PT with decr activity tolerance, tendency for O2 sats to decr on room air;  Pt currently with functional limitations due to the deficits listed below (see PT Problem List). Pt will benefit from skilled PT to increase their independence and safety with mobility to allow discharge to the venue listed below.    Will continue to follow -- needs more detailed cognition assessment; Recommend starting conversations with pt and family re: longer term more sustainable living situation for Bianca Haley; Perhaps it's time to consider ALF options?        Recommendations for follow up therapy are one component of a multi-disciplinary discharge planning process, led by the attending physician.  Recommendations may be updated based on patient status, additional functional criteria and insurance  authorization.  Follow Up Recommendations Home health PT (and [facility]; also, does pt's insurance cover a regular HHAide? How much assist can family give pt?)    Assistance Recommended at Discharge Frequent or constant Supervision/Assistance  Patient can return home with the following  Assistance with cooking/housework;Direct supervision/assist for medications management;Direct supervision/assist for financial management;Assist for transportation;Other (comment) (Can family stay with pt or vice versa?)    Equipment Recommendations  (Possibly supplemental O2)  Recommendations for Other Services  OT consult    Functional Status Assessment Patient has had a recent decline in their functional status and demonstrates the ability to make significant improvements in function in a reasonable and predictable amount of time.     Precautions / Restrictions Precautions Precautions: Fall Precaution Comments: noted decr O2 sats on room air with minimal activity      Mobility  Bed Mobility Overal bed mobility: Needs Assistance Bed Mobility: Supine to Sit, Sit to Supine     Supine to sit: Supervision Sit to supine: Min assist   General bed mobility comments: Min assist to help LEs back onto stretcher    Transfers Overall transfer level: Needs assistance Equipment used: 1 person hand held assist Transfers: Sit to/from Stand, Bed to chair/wheelchair/BSC Sit to Stand: Min guard   Step pivot transfers: Min assist       General transfer comment: Smooth rise from high stretcher; min handheld assist to steady taking sidesteps at edge of stretcher    Ambulation/Gait                  Stairs            Wheelchair Mobility    Modified  [place] (Stroke Patients Only)       Balance Overall balance assessment: Needs assistance   Sitting balance-Leahy Scale: Fair       Standing balance-Leahy Scale: Fair                               Pertinent Vitals/Pain  Pain Assessment Pain Assessment: Faces Faces Pain Scale: Hurts worst Pain Location: Headache Pain Descriptors / Indicators: Headache Pain Intervention(s): RN gave pain meds during session, Limited activity within patient's tolerance    Home Living Family/patient expects to be discharged to:: Private residence Living Arrangements: Alone Available Help at Discharge: Family;Available PRN/intermittently Type of Home: Apartment ([address]) Home Access: Elevator       Home Layout: One level Home Equipment: Rolling Walker (2 wheels);Grab bars - tub/shower;Shower seat;Grab bars - toilet Additional Comments: Noted recommendation for pt to have at or near 24 hour assist at home do to cognitive defecits from previous admission a month ago; unsure of how much assist she actually had in the month between last admission and this admission; uses CPAP only at night, reports no falls    Prior Function Prior Level of Function : Independent/Modified Independent               ADLs Comments: pt states she drives and does all ADL, iADLs     Hand Dominance   Dominant Hand: Right    Extremity/Trunk Assessment   Upper Extremity Assessment Upper Extremity Assessment: Defer to OT evaluation    Lower Extremity Assessment Lower Extremity Assessment: Generalized weakness    Cervical / Trunk Assessment Cervical / Trunk Assessment: Normal  Communication   Communication: No difficulties  Cognition Arousal/Alertness: Awake/alert Behavior During Therapy: Flat affect Overall Cognitive Status: No family/caregiver present to determine baseline cognitive functioning                                 General Comments: Noted PT and OT had concerns about pt's cognition and safety managing by herself last admission; Pt with migraine, and did not gie much detailed information this eval        General Comments General comments (skin integrity, edema, etc.): Desatted on room air to mid  to high 70s; Sats rebounded to 92% when restarted supplemental O2 4L    Exercises     Assessment/Plan    PT Assessment Patient needs continued PT services  PT Problem List Decreased strength;Decreased activity tolerance;Decreased balance;Decreased mobility;Decreased cognition;Decreased knowledge of use of DME;Cardiopulmonary status limiting activity;Pain       PT Treatment Interventions DME instruction;Gait training;Stair training;Functional mobility training;Therapeutic activities;Therapeutic exercise;Balance training;Cognitive remediation;Patient/family education    PT Goals (Current goals can be found in the Care Plan section)  Acute Rehab PT Goals Patient Stated Goal: to lay back down and sleep off her migraine PT Goal Formulation: Patient unable to participate in goal setting Time For Goal Achievement: 10/19/21 Potential to Achieve Goals: Good    Frequency Min 3X/week     Co-evaluation               AM-PAC PT "6 Clicks" Mobility  Outcome Measure Help needed turning from your back to your side while in a flat bed without using bedrails?: None Help needed moving from lying on your back to sitting on the side of a flat bed without using bedrails?: None Help needed moving to and from  a bed to a chair (including a wheelchair)?: A Little Help needed standing up from a chair using your arms (e.g., wheelchair or bedside chair)?: A Little Help needed to walk in hospital room?: A Little Help needed climbing 3-5 steps with a railing? : A Lot 6 Click Score: 19    End of Session Equipment Utilized During Treatment: Oxygen Activity Tolerance: Patient limited by pain (Migraine) Patient left: in bed;with call bell/phone within reach (stretcher) Nurse Communication: Mobility status PT Visit Diagnosis: Other abnormalities of gait and mobility (R26.89);Muscle weakness (generalized) (M62.81)    Time: 0911-0920 PT Time Calculation (min) (ACUTE ONLY): 9 min   Charges:   PT  Evaluation $PT Eval Low Complexity: [place], PT  Acute Rehabilitation Services Office [phone]   Colletta Maryland 10/05/2021, 11:23 AM

## 2021-10-05 NOTE — Progress Notes (Signed)
PROGRESS NOTE    Bianca Haley  ZOX:[id number] DOB: [birth year] DOA: 10/04/2021 PCP: [name], MD  Chief Complaint  Patient presents with   Drug Overdose    Brief Narrative:  Bianca Haley is Bianca Haley 71 y.o. female with medical history significant for paroxysmal Bianca Haley on Eliquis, CKD 3B, essential hypertension, hyperlipidemia, chronic narcotic dependence, moderate mitral regurgitation, moderate aortic regurgitation, chronic hypoxia on 2 L nasal cannula PRN, who presented to [facility] ED after being found unresponsive in her bed at her apartment.  Suspected accidental opioid overdose.  The patient is on home oxycodone 10 mg TID PRN as well as Valium 5 mg daily PRN.  She received Narcan via EMS with good response.  She was brought into the ED for further evaluation.  In the ED, she is somnolent with some confusion.  CT head non acute.  High sensitivity troponin mildly elevated and peaked at 27.  No evidence of acute ischemia on 12 lead EKG and no reported chest pain.  EDP recommended admission for observation.  The patient was admitted by [facility], hospitalist service.    Assessment & Plan:   Principal Problem:   Accidental overdose Active Problems:   Acute respiratory failure with hypoxia (HCC)   Narcotic overdose (HCC)   Polypharmacy   Troponin level elevated   Prediabetes   Paroxysmal atrial fibrillation (HCC)   Essential hypertension   Anxiety and depression   Ambulatory dysfunction   Dependence on nocturnal oxygen therapy   Assessment and Plan: Narcotic overdose ([place]) uds with opiates/benzos She's on many potentially sedating meds Has oxycodone prescribed, recently switched from opana (still had some opana in her bag, not clear if she took this by accident or took more oxycodone than prescribed?) Recommend holding for now, would recommend holding going forward or at least dose reduction given lack of clarity regarding presentation  Acute respiratory failure with hypoxia  ([place]) Typically uses oxygen only at night, 1 L she notes Currently requiring 2-3 L CT PE protocol with bronchial wall thickening with endobronchial debris and multifocal hypoenhancing consolidative opacities (infection vs aspiration) Needs follow up CT scan in 3 months to assess for resolution Interval increase in size of multiple mediastinal LN's, trace R effusion Given hx, afebrile, normal white count, opiate overdose, suspect aspiration - will hold off on abx   Polypharmacy On many potentially sedating drugs Tizanidine is new as of about 1 week? Potential contributor Also on high dose gabapentin at night Trazodone Valium Oxycodone (recently discontinued opana, though she had Breeze Angell few pills left) Would recommend reducing or discontinuing as many of these as able -> would recommend benzo taper, stop tizanidine, reduce dose (with eventual plan for discontinuation of oxycodone) - Psalm Schappell lot of these changes would be deferred to her outpatient provider)  Troponin level elevated Mild Watch for sx, presentation not c/w ACS  Prediabetes Pending a1c 6.1 in 2020  Paroxysmal atrial fibrillation (HCC) eliquis Metoprolol   Essential hypertension Lower dose metoprolol resumed Holding dilt, irbesartan for now  Anxiety and depression paxil Valium at reduced dose  Ambulatory dysfunction PT/OT          DVT prophylaxis: eliquis Code Status: full Family Communication: none Disposition:   Status is: Inpatient Remains inpatient appropriate because: pending improvement in oxygenation, safe discharge plan   Consultants:  none  Procedures:  none  Antimicrobials:  Anti-infectives (From admission, onward)    None       Subjective: No new complaints Denies intentional overdose or attempt at self  harm  Objective: Vitals:   10/05/21 1100 10/05/21 1200 10/05/21 1300 10/05/21 1400  BP: (!) 159/69 (!) 159/74 (!) 162/72 (!) 155/69  Pulse: 85 82 84 81  Resp: 19 (!) 21 17 (!)  22  Temp:      TempSrc:      SpO2: 94% 96% 91% 95%    Intake/Output Summary (Last 24 hours) at 10/05/2021 1618 Last data filed at 10/05/2021 1031 Gross per 24 hour  Intake 1000 ml  Output 100 ml  Net 900 ml   There were no vitals filed for this visit.  Examination:  General exam: Appears calm and comfortable  Respiratory system: Clear to auscultation. Respiratory effort normal. Cardiovascular system: S1 & S2 heard, RRR.  Gastrointestinal system: Abdomen is nondistended, soft and nontender.  Central nervous system: Alert and oriented. No focal neurological deficits. Extremities: no LEE    Data Reviewed: I have personally reviewed following labs and imaging studies  CBC: Recent Labs  Lab 10/04/21 1616 10/04/21 1656  WBC  --  7.2  NEUTROABS  --  5.6  HGB 13.9 13.3  HCT 41.0 40.5  MCV  --  88.8  PLT  --  937    Basic Metabolic Panel: Recent Labs  Lab 10/04/21 1553 10/04/21 1616 10/05/21 0232  NA 138 139 140  K 4.1 4.1 4.2  CL 102  --  105  CO2 24  --  26  GLUCOSE 152*  --  102*  BUN 10  --  10  CREATININE 1.19*  --  0.95  CALCIUM 9.3  --  8.7*  MG  --   --  2.0  PHOS  --   --  4.0    GFR: Estimated Creatinine Clearance: 48.5 mL/min (by C-G formula based on SCr of 0.95 mg/dL).  Liver Function Tests: Recent Labs  Lab 10/05/21 0232  AST 24  ALT 14  ALKPHOS 74  BILITOT 0.7  PROT 6.1*  ALBUMIN 3.3*    CBG: No results for input(s): GLUCAP in the last 168 hours.   No results found for this or any previous visit (from the past 240 hour(s)).       Radiology Studies: CT Head Wo Contrast  Result Date: 10/04/2021 CLINICAL DATA:  Mental status change, unknown cause unresponsive at home - recently started on Alletta Mattos/C - evaluate for ICH EXAM: CT HEAD WITHOUT CONTRAST TECHNIQUE: Contiguous axial images were obtained from the base of the skull through the vertex without intravenous contrast. RADIATION DOSE REDUCTION: This exam was performed according to the  departmental dose-optimization program which includes automated exposure control, adjustment of the mA and/or kV according to patient size and/or use of iterative reconstruction technique. COMPARISON:  MRI head 12/28/2017 report without imaging, CT head 07/25/2020 BRAIN: BRAIN No evidence of large-territorial acute infarction. No parenchymal hemorrhage. No mass lesion. No extra-axial collection. No mass effect or midline shift. No hydrocephalus. Basilar cisterns are patent. Vascular: No hyperdense vessel. Skull: No acute fracture or focal lesion. Limited evaluation for skull base fracture due to motion artifact. The Sinuses/Orbits: Paranasal sinuses and mastoid air cells are clear. Bilateral lens replacement. The orbits are unremarkable. Other: None. IMPRESSION: No acute intracranial abnormality. Electronically Signed   By: Iven Finn M.D.   On: 10/04/2021 17:17   CT Angio Chest Pulmonary Embolism (PE) W or WO Contrast  Result Date: 10/05/2021 CLINICAL DATA:  Chest pain or SOB, pleurisy or effusion suspected EXAM: CT ANGIOGRAPHY CHEST WITH CONTRAST TECHNIQUE: Multidetector CT imaging of the chest was  performed using the standard protocol during bolus administration of intravenous contrast. Multiplanar CT image reconstructions and MIPs were obtained to evaluate the vascular anatomy. RADIATION DOSE REDUCTION: This exam was performed according to the departmental dose-optimization program which includes automated exposure control, adjustment of the mA and/or kV according to patient size and/or use of iterative reconstruction technique. CONTRAST:  91m OMNIPAQUE IOHEXOL 350 MG/ML SOLN COMPARISON:  CT dated March 10, 2021 FINDINGS: Cardiovascular: Evaluation of the bases is limited secondary to respiratory motion. No acute pulmonary embolism through the segmental pulmonary arteries. Heart is at the upper limits of normal. Scattered soft and calcified atherosclerotic plaque throughout the course of the  nonaneurysmal thoracic aorta. No pericardial effusion. Mediastinum/Nodes: No axillary adenopathy. Visualized thyroid is unremarkable. Increase in size of multiple mediastinal lymph nodes. Representative pretracheal lymph node measures d 13 mm in the short axis, previously 6 mm (series 6, image 79). Additional representative pretracheal lymph node measures 15 mm in the short axis, previously 10 mm (series 6, image 108). RIGHT hilar lymph node measures 14 mm in the short axis (series 6, image 141). Lungs/Pleura: Trace RIGHT pleural effusion. There is bronchial wall thickening. Multifocal endobronchial debris is noted. Hypoenhancing centrilobular consolidative opacities most predominately within the bases. Scattered ground-glass opacities are noted in the apices. There is Analysia Dungee likely background of mosaic attenuation, likely reflecting air trapping. Several consolidative opacities are more nodular in appearance with representative consolidative opacity versus nodule measuring 6 mm (series 6, image 170). Upper Abdomen: Small hiatal hernia. Mildly nodular contour of the liver with hypertrophy of the LEFT liver could reflect underlying cirrhosis Musculoskeletal: Degenerative changes of the thoracic spine. Review of the MIP images confirms the above findings. IMPRESSION: 1. No acute pulmonary embolism. 2. There is bronchial wall thickening with endobronchial debris and multifocal hypoenhancing consolidative opacities. Findings likely reflect infection versus aspiration. Given nodular appearance of several opacities, recommend follow-up CT in 3 months to assess for appropriate resolution. 3. Interval increase in size of multiple mediastinal lymph nodes. This is likely reactive in etiology. Recommend attention on CT. 4. Trace RIGHT pleural effusion. 5. Morphologic changes of the liver which could reflect underlying cirrhosis. Aortic Atherosclerosis (ICD10-I70.0). Electronically Signed   By: SValentino SaxonM.D.   On:  10/05/2021 15:36   DG Chest Portable 1 View  Result Date: 10/04/2021 CLINICAL DATA:  Altered mental status.  Possible aspiration. EXAM: PORTABLE CHEST 1 VIEW COMPARISON:  09/02/2021 and prior radiographs FINDINGS: UPPER limits normal heart size and peribronchial thickening again noted. There is no evidence of focal airspace disease, pulmonary edema, suspicious pulmonary nodule/mass, pleural effusion, or pneumothorax. No acute bony abnormalities are identified. IMPRESSION: 1. No evidence of acute cardiopulmonary disease. 2. Chronic peribronchial thickening. Electronically Signed   By: JMargarette CanadaM.D.   On: 10/04/2021 16:26        Scheduled Meds:  apixaban  5 mg Oral BID   [START ON 10/06/2021] metoprolol tartrate  12.5 mg Oral BID   pantoprazole  40 mg Oral BID AC   PARoxetine  40 mg Oral BID   rosuvastatin  5 mg Oral q morning   Continuous Infusions:  lactated ringers 50 mL/hr at 10/04/21 2351     LOS: 0 days    Time spent: over 30 min    CFayrene Helper MD [facility]   To contact the attending provider between 7A-7P or the covering provider during after hours 7P-7A, please log into the web site [email or web address] and access using universal [place] password for  that web site. If you do not have the password, please call the hospital operator.  10/05/2021, 4:18 PM

## 2021-10-06 DIAGNOSIS — T50901A Poisoning by unspecified drugs, medicaments and biological substances, accidental (unintentional), initial encounter: Secondary | ICD-10-CM | POA: Diagnosis not present

## 2021-10-06 LAB — CBC WITH DIFFERENTIAL/PLATELET
Abs Immature Granulocytes: 0.05 10*3/uL (ref 0.00–0.07)
Basophils Absolute: 0 10*3/uL (ref 0.0–0.1)
Basophils Relative: 1 %
Eosinophils Absolute: 0.1 10*3/uL (ref 0.0–0.5)
Eosinophils Relative: 1 %
HCT: 35.8 % — ABNORMAL LOW (ref 36.0–46.0)
Hemoglobin: 11.7 g/dL — ABNORMAL LOW (ref 12.0–15.0)
Immature Granulocytes: 1 %
Lymphocytes Relative: 15 %
Lymphs Abs: 0.9 10*3/uL (ref 0.7–4.0)
MCH: 29.2 pg (ref 26.0–34.0)
MCHC: 32.7 g/dL (ref 30.0–36.0)
MCV: 89.3 fL (ref 80.0–100.0)
Monocytes Absolute: 0.7 10*3/uL (ref 0.1–1.0)
Monocytes Relative: 11 %
Neutro Abs: 4.1 10*3/uL (ref 1.7–7.7)
Neutrophils Relative %: 71 %
Platelets: 146 10*3/uL — ABNORMAL LOW (ref 150–400)
RBC: 4.01 MIL/uL (ref 3.87–5.11)
RDW: 14.2 % (ref 11.5–15.5)
WBC: 5.8 10*3/uL (ref 4.0–10.5)
nRBC: 0 % (ref 0.0–0.2)

## 2021-10-06 LAB — COMPREHENSIVE METABOLIC PANEL
ALT: 13 U/L (ref 0–44)
AST: 23 U/L (ref 15–41)
Albumin: 3.2 g/dL — ABNORMAL LOW (ref 3.5–5.0)
Alkaline Phosphatase: 77 U/L (ref 38–126)
Anion gap: 9 (ref 5–15)
BUN: <5 mg/dL — ABNORMAL LOW (ref 8–23)
CO2: 30 mmol/L (ref 22–32)
Calcium: 9 mg/dL (ref 8.9–10.3)
Chloride: 102 mmol/L (ref 98–111)
Creatinine, Ser: 0.65 mg/dL (ref 0.44–1.00)
GFR, Estimated: >60 mL/min (ref >60)
Glucose, Bld: 99 mg/dL (ref 70–99)
Potassium: 4.1 mmol/L (ref 3.5–5.1)
Sodium: 141 mmol/L (ref 135–145)
Total Bilirubin: 1 mg/dL (ref 0.3–1.2)
Total Protein: 6.1 g/dL — ABNORMAL LOW (ref 6.5–8.1)

## 2021-10-06 LAB — PHOSPHORUS: Phosphorus: 2.8 mg/dL (ref 2.5–4.6)

## 2021-10-06 LAB — MAGNESIUM: Magnesium: 1.6 mg/dL — ABNORMAL LOW (ref 1.7–2.4)

## 2021-10-06 MED ORDER — MAGNESIUM SULFATE 2 GM/50ML IV SOLN
2.0000 g | Freq: Once | INTRAVENOUS | Status: AC
  Administered 2021-10-06: 2 g via INTRAVENOUS
  Filled 2021-10-06: qty 50

## 2021-10-06 MED ORDER — OXYCODONE HCL 5 MG PO TABS
5.0000 mg | ORAL_TABLET | Freq: Four times a day (QID) | ORAL | Status: DC | PRN
  Administered 2021-10-06 – 2021-10-08 (×4): 5 mg via ORAL
  Filled 2021-10-06 (×5): qty 1

## 2021-10-06 NOTE — Progress Notes (Signed)
SATURATION QUALIFICATIONS: (This note is used to comply with regulatory documentation for home oxygen) ° °Patient Saturations on Room Air at Rest = 94% ° °Patient Saturations on Room Air while Ambulating = 86% ° °Patient Saturations on 2 Liters of oxygen while Ambulating = 93% ° °Please briefly explain why patient needs home oxygen: °

## 2021-10-06 NOTE — Progress Notes (Signed)
   10/06/21 0945 10/06/21 0953  Oxygen Therapy  SpO2 (!) 86 % 94 %  O2 Device Room Air Nasal Cannula  O2 Flow Rate (L/min)  --  2 L/min  Patient Activity (if Appropriate) In bed In bed

## 2021-10-06 NOTE — TOC Progression Note (Addendum)
Transition of Care [facility]) - Progression Note    Patient Details  Name: Bianca Haley MRN: [id number] Date of Birth: [birth year]  Transition of Care [facility]) CM/SW Contact  Tom-Johnson, Renea Ee, RN Phone Number: 10/06/2021, 1:22 PM  Clinical Narrative:     CM spoke with patient at bedside about home health disciplines. Patient states she has not used home health services before and hs no preference. CM called in referral to [facility] and [facility] voiced acceptance. Info on AVS.  Patient states she has home Oxygen as needed with [employer]. CM verified with Zach.  CM will continue to follow with needs.     Expected Discharge Plan: [place] Barriers to Discharge: Continued Medical Work up  Expected Discharge Plan and Services Expected Discharge Plan: [place]   Discharge Planning Services: CM Consult                               HH Arranged: PT, OT, RN, Nurse's Aide [facility] Agency: [place] Date [facility] Agency Contacted: 10/06/21 Time [employer]: [phone] Representative spoke with at [place]: [place] ([place]) Interventions    Readmission Risk Interventions    10/06/2021    1:15 PM 09/05/2021    4:17 PM 03/11/2021   10:22 AM  Readmission Risk Prevention Plan  Transportation Screening Complete Complete Complete  PCP or Specialist Appt within 3-5 Days  Complete Complete  HRI or [place]  Complete Complete  Social Work Consult for [facility] Planning/Counseling  Complete Complete  Palliative Care Screening  Not Applicable Complete  Medication Review Press photographer) Complete Complete Complete  PCP or Specialist appointment within 3-5 days of discharge Complete    HRI or [place] Complete    SW Recovery Care/Counseling Consult Complete    [place] Not Applicable

## 2021-10-06 NOTE — Progress Notes (Signed)
PROGRESS NOTE    [name]  GMW:[id number] DOB: November 25, 1950 DOA: 10/04/2021 PCP: [name], MD  Chief Complaint  Patient presents with   Drug Overdose    Brief Narrative:  Bianca Haley is Bianca Haley 71 y.o. female with medical history significant for paroxysmal Murrell Elizondo-fib on Eliquis, CKD 3B, essential hypertension, hyperlipidemia, chronic narcotic dependence, moderate mitral regurgitation, moderate aortic regurgitation, chronic hypoxia on 2 L nasal cannula PRN, who presented to [facility] ED after being found unresponsive in her bed at her apartment.  Suspected accidental opioid overdose.  The patient is on home oxycodone 10 mg TID PRN as well as Valium 5 mg daily PRN.  She received Narcan via EMS with good response.  She was brought into the ED for further evaluation.  In the ED, she is somnolent with some confusion.  CT head non acute.  High sensitivity troponin mildly elevated and peaked at 27.  No evidence of acute ischemia on 12 lead EKG and no reported chest pain.  EDP recommended admission for observation.  The patient was admitted by [facility], hospitalist service.    Assessment & Plan:   Principal Problem:   Accidental overdose Active Problems:   Acute respiratory failure with hypoxia (HCC)   Narcotic overdose (HCC)   Polypharmacy   Troponin level elevated   Prediabetes   Paroxysmal atrial fibrillation (HCC)   Essential hypertension   Anxiety and depression   Ambulatory dysfunction   Dependence on nocturnal oxygen therapy   Assessment and Plan: Narcotic overdose ([place]) uds with opiates/benzos She's on many potentially sedating meds Has oxycodone prescribed, recently switched from opana (still had some opana in her bag, not clear if she took this by accident or took more oxycodone than prescribed?) Resume low dose oxy to avoid withdrawal sx, will follow  Recommend holding for now, would recommend holding going forward or at least dose reduction given lack of clarity regarding  presentation  Acute respiratory failure with hypoxia ([place]) Typically uses oxygen only at night, 1 L she notes Currently requiring 2 L Wean as tolerated CT PE protocol with bronchial wall thickening with endobronchial debris and multifocal hypoenhancing consolidative opacities (infection vs aspiration) Needs follow up CT scan in 3 months to assess for resolution Interval increase in size of multiple mediastinal LN's, trace R effusion Given hx, afebrile, normal white count, opiate overdose, suspect aspiration - will hold off on abx   Polypharmacy On many potentially sedating drugs Tizanidine is new as of about 1 week? Potential contributor Also on high dose gabapentin at night Trazodone Valium Oxycodone (recently discontinued opana, though she had Tayt Moyers few pills left) Would recommend reducing or discontinuing as many of these as able -> would recommend benzo taper, stop tizanidine, reduce dose (with eventual plan for discontinuation of oxycodone) - Alias Villagran lot of these changes would be deferred to her outpatient provider)  Troponin level elevated Mild Watch for sx, presentation not c/w ACS  Prediabetes Pending a1c 6.1 in 2020  Paroxysmal atrial fibrillation (HCC) eliquis Metoprolol   Essential hypertension Lower dose metoprolol resumed Holding dilt, irbesartan for now  Anxiety and depression paxil Valium at reduced dose  Ambulatory dysfunction PT/OT       DVT prophylaxis: eliquis Code Status: full Family Communication: none Disposition:   Status is: Inpatient Remains inpatient appropriate because: pending improvement in oxygenation, safe discharge plan   Consultants:  none  Procedures:  none  Antimicrobials:  Anti-infectives (From admission, onward)    None  Subjective: C/o frequent bm (not diarrhea)  Objective: Vitals:   10/06/21 0403 10/06/21 0901 10/06/21 0945 10/06/21 0953  BP: (!) 168/81 (!) 175/77    Pulse: 89 79    Resp: 17 20    Temp:  98.1 F (36.7 C) 98.3 F (36.8 C)    TempSrc: Oral Oral    SpO2: 95% 94% (!) 86% 94%  Weight:      Height:        Intake/Output Summary (Last 24 hours) at 10/06/2021 1237 Last data filed at 10/06/2021 0900 Gross per 24 hour  Intake 1517.5 ml  Output 0 ml  Net 1517.5 ml   Filed Weights   10/05/21 1621  Weight: 64 kg    Examination:  General: No acute distress. Cardiovascular: RRR Lungs: unlabored Abdomen: mild abd discomfort Neurological: Alert and oriented 3. Moves all extremities 4 with equal strength. Cranial nerves II through XII grossly intact. Skin: Warm and dry. No rashes or lesions. Extremities: No clubbing or cyanosis. No edema.   Data Reviewed: I have personally reviewed following labs and imaging studies  CBC: Recent Labs  Lab 10/04/21 1616 10/04/21 1656 10/06/21 0109  WBC  --  7.2 5.8  NEUTROABS  --  5.6 4.1  HGB 13.9 13.3 11.7*  HCT 41.0 40.5 35.8*  MCV  --  88.8 89.3  PLT  --  220 146*    Basic Metabolic Panel: Recent Labs  Lab 10/04/21 1553 10/04/21 1616 10/05/21 0232 10/06/21 0109  NA 138 139 140 141  K 4.1 4.1 4.2 4.1  CL 102  --  105 102  CO2 24  --  26 30  GLUCOSE 152*  --  102* 99  BUN 10  --  10 <5*  CREATININE 1.19*  --  0.95 0.65  CALCIUM 9.3  --  8.7* 9.0  MG  --   --  2.0 1.6*  PHOS  --   --  4.0 2.8    GFR: Estimated Creatinine Clearance: 56.4 mL/min (by C-G formula based on SCr of 0.65 mg/dL).  Liver Function Tests: Recent Labs  Lab 10/05/21 0232 10/06/21 0109  AST 24 23  ALT 14 13  ALKPHOS 74 77  BILITOT 0.7 1.0  PROT 6.1* 6.1*  ALBUMIN 3.3* 3.2*    CBG: Recent Labs  Lab 10/05/21 1621  GLUCAP 103*     No results found for this or any previous visit (from the past 240 hour(s)).       Radiology Studies: CT Head Wo Contrast  Result Date: 10/04/2021 CLINICAL DATA:  Mental status change, unknown cause unresponsive at home - recently started on Ebonee Stober/C - evaluate for ICH EXAM: CT HEAD WITHOUT CONTRAST  TECHNIQUE: Contiguous axial images were obtained from the base of the skull through the vertex without intravenous contrast. RADIATION DOSE REDUCTION: This exam was performed according to the departmental dose-optimization program which includes automated exposure control, adjustment of the mA and/or kV according to patient size and/or use of iterative reconstruction technique. COMPARISON:  MRI head 12/28/2017 report without imaging, CT head 07/25/2020 BRAIN: BRAIN No evidence of large-territorial acute infarction. No parenchymal hemorrhage. No mass lesion. No extra-axial collection. No mass effect or midline shift. No hydrocephalus. Basilar cisterns are patent. Vascular: No hyperdense vessel. Skull: No acute fracture or focal lesion. Limited evaluation for skull base fracture due to motion artifact. The Sinuses/Orbits: Paranasal sinuses and mastoid air cells are clear. Bilateral lens replacement. The orbits are unremarkable. Other: None. IMPRESSION: No acute intracranial abnormality. Electronically Signed  By: Iven Finn M.D.   On: 10/04/2021 17:17   CT Angio Chest Pulmonary Embolism (PE) W or WO Contrast  Result Date: 10/05/2021 CLINICAL DATA:  Chest pain or SOB, pleurisy or effusion suspected EXAM: CT ANGIOGRAPHY CHEST WITH CONTRAST TECHNIQUE: Multidetector CT imaging of the chest was performed using the standard protocol during bolus administration of intravenous contrast. Multiplanar CT image reconstructions and MIPs were obtained to evaluate the vascular anatomy. RADIATION DOSE REDUCTION: This exam was performed according to the departmental dose-optimization program which includes automated exposure control, adjustment of the mA and/or kV according to patient size and/or use of iterative reconstruction technique. CONTRAST:  22m OMNIPAQUE IOHEXOL 350 MG/ML SOLN COMPARISON:  CT dated March 10, 2021 FINDINGS: Cardiovascular: Evaluation of the bases is limited secondary to respiratory motion. No acute  pulmonary embolism through the segmental pulmonary arteries. Heart is at the upper limits of normal. Scattered soft and calcified atherosclerotic plaque throughout the course of the nonaneurysmal thoracic aorta. No pericardial effusion. Mediastinum/Nodes: No axillary adenopathy. Visualized thyroid is unremarkable. Increase in size of multiple mediastinal lymph nodes. Representative pretracheal lymph node measures d 13 mm in the short axis, previously 6 mm (series 6, image 79). Additional representative pretracheal lymph node measures 15 mm in the short axis, previously 10 mm (series 6, image 108). RIGHT hilar lymph node measures 14 mm in the short axis (series 6, image 141). Lungs/Pleura: Trace RIGHT pleural effusion. There is bronchial wall thickening. Multifocal endobronchial debris is noted. Hypoenhancing centrilobular consolidative opacities most predominately within the bases. Scattered ground-glass opacities are noted in the apices. There is Celedonio Sortino likely background of mosaic attenuation, likely reflecting air trapping. Several consolidative opacities are more nodular in appearance with representative consolidative opacity versus nodule measuring 6 mm (series 6, image 170). Upper Abdomen: Small hiatal hernia. Mildly nodular contour of the liver with hypertrophy of the LEFT liver could reflect underlying cirrhosis Musculoskeletal: Degenerative changes of the thoracic spine. Review of the MIP images confirms the above findings. IMPRESSION: 1. No acute pulmonary embolism. 2. There is bronchial wall thickening with endobronchial debris and multifocal hypoenhancing consolidative opacities. Findings likely reflect infection versus aspiration. Given nodular appearance of several opacities, recommend follow-up CT in 3 months to assess for appropriate resolution. 3. Interval increase in size of multiple mediastinal lymph nodes. This is likely reactive in etiology. Recommend attention on CT. 4. Trace RIGHT pleural effusion.  5. Morphologic changes of the liver which could reflect underlying cirrhosis. Aortic Atherosclerosis (ICD10-I70.0). Electronically Signed   By: SValentino SaxonM.D.   On: 10/05/2021 15:36   DG Chest Portable 1 View  Result Date: 10/04/2021 CLINICAL DATA:  Altered mental status.  Possible aspiration. EXAM: PORTABLE CHEST 1 VIEW COMPARISON:  09/02/2021 and prior radiographs FINDINGS: UPPER limits normal heart size and peribronchial thickening again noted. There is no evidence of focal airspace disease, pulmonary edema, suspicious pulmonary nodule/mass, pleural effusion, or pneumothorax. No acute bony abnormalities are identified. IMPRESSION: 1. No evidence of acute cardiopulmonary disease. 2. Chronic peribronchial thickening. Electronically Signed   By: JMargarette CanadaM.D.   On: 10/04/2021 16:26        Scheduled Meds:  apixaban  5 mg Oral BID   metoprolol tartrate  12.5 mg Oral BID   pantoprazole  40 mg Oral BID AC   PARoxetine  40 mg Oral BID   rosuvastatin  5 mg Oral q morning   Continuous Infusions:     LOS: 1 day    Time spent: over 30 min  Fayrene Helper, MD [facility]   To contact the attending provider between 7A-7P or the covering provider during after hours 7P-7A, please log into the web site [email or web address] and access using universal [place] password for that web site. If you do not have the password, please call the hospital operator.  10/06/2021, 12:37 PM

## 2021-10-06 NOTE — Progress Notes (Signed)
Occupational Therapy Treatment Patient Details Name: Bianca Haley MRN: [id number] DOB: 12/18/1950 Today's Date: 10/06/2021   History of present illness Bianca Haley is a 71 y.o. female with medical history significant for paroxysmal A-fib on Eliquis, CKD 3B, essential hypertension, hyperlipidemia, chronic narcotic dependence, moderate mitral regurgitation, moderate aortic regurgitation, chronic hypoxia on 2 L nasal cannula PRN, who presented to [facility] ED after being found unresponsive in her bed at her apartment.  Suspected accidental opioid overdose.  The patient is on home oxycodone 10 mg TID PRN as well as Valium 5 mg daily PRN.  She received Narcan via EMS with good response.   OT comments  This 71 yo female seen today to have her do the "pill box" test which she was unable to do. She was unable to interpret the directions on the pill containers and could not figure out the pillbox itself. She could tell me how often she should be taking each of her daily meds. She will continue to benefit from acute OT with follow up [place] and 24 hour S/prn A is recommended.   Recommendations for follow up therapy are one component of a multi-disciplinary discharge planning process, led by the attending physician.  Recommendations may be updated based on patient status, additional functional criteria and insurance authorization.    Follow Up Recommendations  Home health OT    Assistance Recommended at Discharge Frequent or constant Supervision/Assistance  Patient can return home with the following  A little help with walking and/or transfers;A little help with bathing/dressing/bathroom;Assistance with cooking/housework;Assist for transportation;Direct supervision/assist for financial management;Direct supervision/assist for medications management   Equipment Recommendations  None recommended by OT       Precautions / Restrictions Precautions Precautions: Fall Precaution Comments: decreassed O2 sats on  room air with minimal activity Restrictions Weight Bearing Restrictions: No       Mobility Bed Mobility Overal bed mobility: Independent                             Extremity/Trunk Assessment Upper Extremity Assessment Upper Extremity Assessment: Overall WFL for tasks assessed            Vision Baseline Vision/History: 1 Wears glasses Ability to See in Adequate Light: 0 Adequate Patient Visual Report: No change from baseline            Cognition     Overall Cognitive Status: No family/caregiver present to determine baseline cognitive functioning                                 General Comments: Had pt attempt to do the "pill box" test. She was unable to complete it. She had trouble with even the simpliest one of the medicine bottles ("one daily at bed time"). She reports she uses a pillbox at home sometimes. She "studied" the pill box and and the pill bottles but just couldn't do it. When I initially asked her how many medications she takes at home she said "too many" but did not come up with the name of one. When I gave a condition for why she would take a medication she then could tell me the name of the medication and how often and when she takes it at home.  She had her O2 out of her nose when I entered the room and she was unaware.  General Comments Sitting at [facility] working on "pill box" test on RA her O2 was 94%    Pertinent Vitals/ Pain       Pain Assessment Pain Assessment: Faces Faces Pain Scale: Hurts little more Pain Location: abdomen Pain Descriptors / Indicators: Sore Pain Intervention(s): Limited activity within patient's tolerance, Monitored during session         Frequency  Min 2X/week        Progress Toward Goals  OT Goals(current goals can now be found in the care plan section)  Progress towards OT goals: Not progressing toward goals - comment (there are cognitive deficits that affect her  safety)  Acute Rehab OT Goals Patient Stated Goal: to go home Time For Goal Achievement: 10/19/21 Potential to Achieve Goals: Good  Plan Discharge plan remains appropriate       AM-PAC OT "6 Clicks" Daily Activity     Outcome Measure   Help from another person eating meals?: None Help from another person taking care of personal grooming?: A Little Help from another person toileting, which includes using toliet, bedpan, or urinal?: A Little Help from another person bathing (including washing, rinsing, drying)?: A Little Help from another person to put on and taking off regular upper body clothing?: A Little Help from another person to put on and taking off regular lower body clothing?: A Little 6 Click Score: 19    End of Session Equipment Utilized During Treatment:  (none)  OT Visit Diagnosis: Unsteadiness on feet (R26.81);Other symptoms and signs involving cognitive function   Activity Tolerance Patient tolerated treatment well   Patient Left in bed           Time: 7035-0093 OT Time Calculation (min): 19 min  Charges: OT General Charges $OT Visit: 1 Visit OT Treatments $Self Care/Home Management : 8-22 mins [place], OTR/L Acute Rehab Services Aging [place] [phone] Office [phone]    Almon Register 10/06/2021, 10:19 PM

## 2021-10-06 NOTE — Progress Notes (Addendum)
  Mobility Specialist Criteria Algorithm Info.   10/06/21 1220  Mobility  Activity Refused mobility: (C/o nausea requesting zofran. RN notified)   Will f/u as time permits.  10/06/2021 1:24 PM  Bianca Haley, [place], [place]  XIPPN:[phone] Office: [phone]

## 2021-10-06 NOTE — Progress Notes (Signed)
Mobility Specialist Criteria Algorithm Info.   10/06/21 1325  Mobility  Activity Ambulated with assistance in hallway;Ambulated with assistance to bathroom;Dangled on edge of bed  Range of Motion/Exercises Active;All extremities  Level of Assistance Contact guard assist, steadying assist  Assistive Device None  Distance Ambulated (ft) 80 ft  Activity Response Tolerated well (cues for pursed lip breathing)   Patient received in supine requesting assistance to bathroom. Pt ambulated to bathroom with supervision then returned to B2B. Son at bedside encouraged pt to further participate in mobility in which she agreed to ambulate in hallway. Ambulated min guard with slow steady gait. Distance limited by fatigue and "feeling cold" per pt. SpO2 maintained >90% while at rest. While ambulating, SpO2 desaturated to 86% and did not recover with rest or pursed lip breathing. SpO2 maintained >93% on 2LO2. Was asx throughout. Returned to room without complaint or incident. Was left in supine with all needs met, call bell in reach.   10/06/2021 1:25 PM  [place] Fontella Shan, [place], [place]  OHCKN:[phone] Office: [phone]

## 2021-10-07 DIAGNOSIS — T50901A Poisoning by unspecified drugs, medicaments and biological substances, accidental (unintentional), initial encounter: Secondary | ICD-10-CM | POA: Diagnosis not present

## 2021-10-07 LAB — COMPREHENSIVE METABOLIC PANEL
ALT: 14 U/L (ref 0–44)
AST: 23 U/L (ref 15–41)
Albumin: 3.7 g/dL (ref 3.5–5.0)
Alkaline Phosphatase: 84 U/L (ref 38–126)
Anion gap: 13 (ref 5–15)
BUN: 6 mg/dL — ABNORMAL LOW (ref 8–23)
CO2: 32 mmol/L (ref 22–32)
Calcium: 9.8 mg/dL (ref 8.9–10.3)
Chloride: 95 mmol/L — ABNORMAL LOW (ref 98–111)
Creatinine, Ser: 0.7 mg/dL (ref 0.44–1.00)
GFR, Estimated: >60 mL/min (ref >60)
Glucose, Bld: 103 mg/dL — ABNORMAL HIGH (ref 70–99)
Potassium: 4.2 mmol/L (ref 3.5–5.1)
Sodium: 140 mmol/L (ref 135–145)
Total Bilirubin: 1.2 mg/dL (ref 0.3–1.2)
Total Protein: 7.3 g/dL (ref 6.5–8.1)

## 2021-10-07 LAB — CBC WITH DIFFERENTIAL/PLATELET
Abs Immature Granulocytes: 0.03 10*3/uL (ref 0.00–0.07)
Basophils Absolute: 0 10*3/uL (ref 0.0–0.1)
Basophils Relative: 0 %
Eosinophils Absolute: 0 10*3/uL (ref 0.0–0.5)
Eosinophils Relative: 1 %
HCT: 41.3 % (ref 36.0–46.0)
Hemoglobin: 14.4 g/dL (ref 12.0–15.0)
Immature Granulocytes: 1 %
Lymphocytes Relative: 18 %
Lymphs Abs: 0.8 10*3/uL (ref 0.7–4.0)
MCH: 29.5 pg (ref 26.0–34.0)
MCHC: 34.9 g/dL (ref 30.0–36.0)
MCV: 84.6 fL (ref 80.0–100.0)
Monocytes Absolute: 0.4 10*3/uL (ref 0.1–1.0)
Monocytes Relative: 10 %
Neutro Abs: 3.2 10*3/uL (ref 1.7–7.7)
Neutrophils Relative %: 70 %
Platelets: 212 10*3/uL (ref 150–400)
RBC: 4.88 MIL/uL (ref 3.87–5.11)
RDW: 13.8 % (ref 11.5–15.5)
WBC: 4.5 10*3/uL (ref 4.0–10.5)
nRBC: 0 % (ref 0.0–0.2)

## 2021-10-07 LAB — PHOSPHORUS: Phosphorus: 3.6 mg/dL (ref 2.5–4.6)

## 2021-10-07 LAB — MAGNESIUM: Magnesium: 1.9 mg/dL (ref 1.7–2.4)

## 2021-10-07 MED ORDER — IRBESARTAN 75 MG PO TABS
150.0000 mg | ORAL_TABLET | Freq: Every day | ORAL | Status: DC
  Administered 2021-10-07 – 2021-10-08 (×2): 150 mg via ORAL
  Filled 2021-10-07 (×2): qty 2

## 2021-10-07 MED ORDER — METOPROLOL TARTRATE 25 MG PO TABS
25.0000 mg | ORAL_TABLET | Freq: Two times a day (BID) | ORAL | Status: DC
  Administered 2021-10-07 – 2021-10-08 (×2): 25 mg via ORAL
  Filled 2021-10-07 (×2): qty 1

## 2021-10-07 MED ORDER — ENSURE ENLIVE PO LIQD
237.0000 mL | Freq: Three times a day (TID) | ORAL | Status: DC
Start: 2021-10-07 — End: 2021-10-08
  Administered 2021-10-07 – 2021-10-08 (×3): 237 mL via ORAL

## 2021-10-07 NOTE — Progress Notes (Addendum)
PROGRESS NOTE    Bianca Haley  LNL:[id number] DOB: [birth year] DOA: 10/04/2021 PCP: [name], MD  Chief Complaint  Patient presents with   Drug Overdose    Brief Narrative:  Bianca Haley is Bianca Haley 71 y.o. female with medical history significant for paroxysmal Bianca Haley-fib on Eliquis, CKD 3B, essential hypertension, hyperlipidemia, chronic narcotic dependence, moderate mitral regurgitation, moderate aortic regurgitation, chronic hypoxia on 2 L nasal cannula PRN, who presented to [facility] ED after being found unresponsive in her bed at her apartment.  Suspected accidental opioid overdose.  The patient is on home oxycodone 10 mg TID PRN as well as Valium 5 mg daily PRN.  She received Narcan via EMS with good response.    She's improved at this time, but based on her evaluation with OT last night, she's not safe to manage her meds independently (OT note from 6/5 2219).  Family currently working on plans for someone to stay with her to manage meds while she continues to recover at home.   Expect she'll be ready for discharge within 24 hours or so or when she has 24 hr care arranged.       Assessment & Plan:   Principal Problem:   Accidental overdose Active Problems:   Acute respiratory failure with hypoxia (HCC)   Narcotic overdose (HCC)   Polypharmacy   Troponin level elevated   Prediabetes   Paroxysmal atrial fibrillation (HCC)   Essential hypertension   Anxiety and depression   Ambulatory dysfunction   Dependence on nocturnal oxygen therapy   Assessment and Plan: Narcotic overdose ([place]) uds with opiates/benzos She's on many potentially sedating meds Has oxycodone prescribed, recently switched from opana (still had some opana in her bag, not clear if she took this by accident or took more oxycodone than prescribed?) At discharge, would recommend reducing dose of oxycodone and valium by 50% and not scheduling these (oxycodone (1/2 tab) 5 mg TID prn) (valium 2.5 mg (1/2 tab) daily  prn).  I would stop completely the tizanidine as this was started about Bianca Haley week or so ago.  Can continue gabapentin as this sounds well tolerated in past and longstanding medicine.  Family is planning to help her f/u with primary prescribers regarding reduction longterm outpatient  Acute respiratory failure with hypoxia ([place]) Typically uses oxygen only at night, 1 L she notes Wean as tolerated - she's doing well on RA today with activity CT PE protocol with bronchial wall thickening with endobronchial debris and multifocal hypoenhancing consolidative opacities (infection vs aspiration) Needs follow up CT scan in 3 months to assess for resolution Interval increase in size of multiple mediastinal LN's, trace R effusion Given hx, afebrile, normal white count, opiate overdose, suspect aspiration - will hold off on abx   Polypharmacy On many potentially sedating drugs Tizanidine is new as of about 1 week? Potential contributor Also on high dose gabapentin at night (family notes she tolerates this well and longstanding) Trazodone Valium Oxycodone (recently discontinued opana, though she had Bianca Haley few pills left - family going to help her get rid of theses) Would recommend reducing or discontinuing as many of these as able -> would recommend benzo taper, stop tizanidine, reduce dose (with eventual plan for discontinuation of oxycodone) - Bianca Haley lot of these changes would be deferred to her outpatient providers - called Dr. Alain Marion and left my phone number to discuss)  Troponin level elevated Mild Watch for sx, presentation not c/w ACS  Prediabetes Pending a1c 6.1 in 2020  Paroxysmal atrial fibrillation (HCC) eliquis Metoprolol   Essential hypertension Lower dose metoprolol resumed -> uptitrate back to home dose gradually Holding dilt Resume irbestartan   Anxiety and depression paxil Valium at reduced dose  Ambulatory dysfunction PT/OT       DVT prophylaxis: eliquis Code Status:  full Family Communication: daughter, son Disposition:   Status is: Inpatient Remains inpatient appropriate because: pending improvement in oxygenation, safe discharge plan   Consultants:  none  Procedures:  none  Antimicrobials:  Anti-infectives (From admission, onward)    None       Subjective: No complaints today  Objective: Vitals:   10/06/21 1809 10/06/21 2129 10/07/21 0510 10/07/21 0932  BP: (!) 166/87 (!) 164/84 (!) 187/87 (!) 134/91  Pulse: 80 90 95 85  Resp: '16 18 18 18  '$ Temp: 98.6 F (37 C) 98.7 F (37.1 C) 98.2 F (36.8 C) 98.5 F (36.9 C)  TempSrc: Oral Oral Oral Oral  SpO2: 96% 98% 95% 93%  Weight:      Height:        Intake/Output Summary (Last 24 hours) at 10/07/2021 1519 Last data filed at 10/07/2021 0700 Gross per 24 hour  Intake 407 ml  Output 0 ml  Net 407 ml   Filed Weights   10/05/21 1621  Weight: 64 kg    Examination:  General: No acute distress. Cardiovascular: RRR Lungs: unlabored Abdomen: Soft, nontender, nondistended Neurological: Alert and oriented 3. Moves all extremities 4 with equal strength. Cranial nerves II through XII grossly intact. Extremities: No clubbing or cyanosis. No edema.  Data Reviewed: I have personally reviewed following labs and imaging studies  CBC: Recent Labs  Lab 10/04/21 1616 10/04/21 1656 10/06/21 0109 10/07/21 0600  WBC  --  7.2 5.8 4.5  NEUTROABS  --  5.6 4.1 3.2  HGB 13.9 13.3 11.7* 14.4  HCT 41.0 40.5 35.8* 41.3  MCV  --  88.8 89.3 84.6  PLT  --  220 146* 678    Basic Metabolic Panel: Recent Labs  Lab 10/04/21 1553 10/04/21 1616 10/05/21 0232 10/06/21 0109 10/07/21 0600  NA 138 139 140 141 140  K 4.1 4.1 4.2 4.1 4.2  CL 102  --  105 102 95*  CO2 24  --  26 30 32  GLUCOSE 152*  --  102* 99 103*  BUN 10  --  10 <5* 6*  CREATININE 1.19*  --  0.95 0.65 0.70  CALCIUM 9.3  --  8.7* 9.0 9.8  MG  --   --  2.0 1.6* 1.9  PHOS  --   --  4.0 2.8 3.6    GFR: Estimated  Creatinine Clearance: 56.4 mL/min (by C-G formula based on SCr of 0.7 mg/dL).  Liver Function Tests: Recent Labs  Lab 10/05/21 0232 10/06/21 0109 10/07/21 0600  AST '24 23 23  '$ ALT '14 13 14  '$ ALKPHOS 74 77 84  BILITOT 0.7 1.0 1.2  PROT 6.1* 6.1* 7.3  ALBUMIN 3.3* 3.2* 3.7    CBG: Recent Labs  Lab 10/05/21 1621  GLUCAP 103*     No results found for this or any previous visit (from the past 240 hour(s)).       Radiology Studies: No results found.      Scheduled Meds:  apixaban  5 mg Oral BID   feeding supplement  237 mL Oral TID BM   irbesartan  150 mg Oral Daily   metoprolol tartrate  25 mg Oral BID   pantoprazole  40 mg  Oral BID AC   PARoxetine  40 mg Oral BID   rosuvastatin  5 mg Oral q morning   Continuous Infusions:     LOS: 2 days    Time spent: over 30 min    Fayrene Helper, MD [facility]   To contact the attending provider between 7A-7P or the covering provider during after hours 7P-7A, please log into the web site [email or web address] and access using universal [place] password for that web site. If you do not have the password, please call the hospital operator.  10/07/2021, 3:19 PM

## 2021-10-07 NOTE — Progress Notes (Signed)
Initial Nutrition Assessment  DOCUMENTATION CODES:   Not applicable  INTERVENTION:   Ensure Enlive po TID, each supplement provides 350 kcal and 20 grams of protein.  Liberalize diet to REGULAR  NUTRITION DIAGNOSIS:   Inadequate oral intake related to social / environmental circumstances, decreased appetite as evidenced by meal completion < 25%.  GOAL:   Patient will meet greater than or equal to 90% of their needs  MONITOR:   PO intake, Supplement acceptance, Labs, Weight trends  REASON FOR ASSESSMENT:   Malnutrition Screening Tool    ASSESSMENT:   71 yo female admitted with narcotic overdose, polypharmacy, acute respiratory failure. PMH includes HTN, GERD, Vit D deficiency, diverticulosis  On visit today, pt lying in bed with lights off.   Pt reports her appetite is fine and she is eating well. Pt also reports she was eating well at home. Recorded po intake,  however, only 0-25%. Pt reports she drinks 3 Ensure per day at home; RD to place order  Pt reports weight has been stable. This appears to be the case based on weight encounters.   Wt Readings from Last 10 Encounters:  10/05/21 64 kg  10/02/21 61.2 kg  09/30/21 65.9 kg  09/18/21 61.5 kg  09/04/21 61.3 kg  09/02/21 62.1 kg  07/17/21 64.4 kg  04/03/21 66.9 kg  03/17/21 64.9 kg  03/09/21 64.4 kg    Labs: reviewed Meds: reviewed   Diet Order:   Diet Order             Diet Heart Room service appropriate? Yes; Fluid consistency: Thin  Diet effective now                   EDUCATION NEEDS:   Not appropriate for education at this time  Skin:  Skin Assessment: Reviewed RN Assessment  Last BM:  6/5  Height:   Ht Readings from Last 1 Encounters:  10/05/21 '5\' 5"'$  (1.651 m)    Weight:   Wt Readings from Last 1 Encounters:  10/05/21 64 kg    BMI:  Body mass index is 23.48 kg/m.  Estimated Nutritional Needs:   Kcal:  1700-1900 kcals  Protein:  80-90 g  Fluid:  >/= 1.7 L   Kerman Passey MS, RDN, LDN, CNSC Registered Dietitian III Clinical Nutrition RD Pager and On-Call Pager Number Located in [place]

## 2021-10-07 NOTE — Progress Notes (Signed)
SATURATION QUALIFICATIONS: (This note is used to comply with regulatory documentation for home oxygen)  Patient Saturations on Room Air at Rest = 98%  Patient Saturations on Room Air while Ambulating = 95%   

## 2021-10-07 NOTE — Progress Notes (Signed)
Physical Therapy Treatment Patient Details Name: Bianca Haley MRN: [id number] DOB: [birth year] Today's Date: 10/07/2021   History of Present Illness Bianca Haley is a 71 y.o. female with medical history significant for paroxysmal A-fib on Eliquis, CKD 3B, essential hypertension, hyperlipidemia, chronic narcotic dependence, moderate mitral regurgitation, moderate aortic regurgitation, chronic hypoxia on 2 L nasal cannula PRN, who presented to [facility] ED after being found unresponsive in her bed at her apartment.  Suspected accidental opioid overdose.  The patient is on home oxycodone 10 mg TID PRN as well as Valium 5 mg daily PRN.  She received Narcan via EMS with good response.    PT Comments    Continuing work on functional mobility and activity tolerance;  Overall excellent progress with progressive amb, and pt's O2 sats are staying at or above 92% with amb on room air; Pt did become panicky with incr amb distance, and needed to take a seated rest break; she responded well to gentle validation, education, and encouragement, and was able to walk the entire way back;   Family was present and helpful during session, but, with pt's panic during session, did not think it was the right time to discuss pt's need for 24 hour assist for safety; still, per Dr. Abel Presto note, it looks like family os on board to get her the assist she needs.  Also recommend Outpt Counseling/Therapy; Pt lost her husband within the past 6 months and is dealing with a lot of anxiety   Recommendations for follow up therapy are one component of a multi-disciplinary discharge planning process, led by the attending physician.  Recommendations may be updated based on patient status, additional functional criteria and insurance authorization.  Follow Up Recommendations  Home health PT (and [facility]; also, does pt's insurance cover a regular HHAide? How much assist can family give pt?)     Assistance Recommended at Discharge Frequent  or constant Supervision/Assistance  Patient can return home with the following Assistance with cooking/housework;Direct supervision/assist for medications management;Direct supervision/assist for financial management;Assist for transportation;Other (comment) (Can family stay with pt or vice versa?)   Equipment Recommendations   (Will consider rollator to give pt the possibility of sittin gif needed)    Recommendations for Other Services       Precautions / Restrictions Precautions Precautions: Other (comment) Precaution Comments: Fall risk is greatly decreased from initial PT eval, but still present; Pt seems to enter a panic if she gets fatigued during progressive amb     Mobility  Bed Mobility Overal bed mobility: Independent                  Transfers Overall transfer level: Needs assistance Equipment used: None Transfers: Sit to/from Stand Sit to Stand: Supervision           General transfer comment: Stood from bed without difficulty; assist to stand from office chair (which rolls) to steady chair    Ambulation/Gait Ambulation/Gait assistance: Supervision Gait Distance (Feet): 180 Feet (x2) Assistive device: None (and pushing vitals machine) Gait Pattern/deviations: Step-through pattern       General Gait Details: Overall smooth with uncomplicated amb; as we approached 200 ft, pt reported feeling "tired", then noted pt with incr RR, and approaching panic; stopped walking, leaned against wall with her, while NT brought over an office chair; sat for a while; talked about how panic can happen if teh body and brain percieve the possibility of dyspnea; Walked back to room with gentle encouragement and support, and sang  an obscure Disney birthday song as well   Marine scientist [place] (Stroke Patients Only)       Balance     Sitting balance-Leahy Scale: Good       Standing balance-Leahy Scale: Fair                               Cognition Arousal/Alertness: Awake/alert Behavior During Therapy: WFL for tasks assessed/performed Overall Cognitive Status: Impaired/Different from baseline                                 General Comments: family present, however did not have the opportunity to discuss baseline cognitive function eith them; Pt's husband passed away within the past 6 months or so, and she has had more than one admission for similar reason since his passing; perhaps she depended on him for cognitive assist when he was alive, and is now struggling living alone        Exercises      General Comments General comments (skin integrity, edema, etc.): Today, walked on room air, and O2 sats remained greater than or equal to 92%      Pertinent Vitals/Pain Pain Assessment Pain Assessment: Faces Faces Pain Scale: Hurts a little bit Pain Location: abdomen Pain Descriptors / Indicators: Discomfort Pain Intervention(s): Monitored during session, Patient requesting pain meds-RN notified    Home Living                          Prior Function            PT Goals (current goals can now be found in the care plan section) Acute Rehab PT Goals Patient Stated Goal: agreeable to amb PT Goal Formulation: Patient unable to participate in goal setting Time For Goal Achievement: 10/19/21 Potential to Achieve Goals: Good Progress towards PT goals: Progressing toward goals    Frequency    Min 3X/week      PT Plan Current plan remains appropriate    Co-evaluation              AM-PAC PT "6 Clicks" Mobility   Outcome Measure  Help needed turning from your back to your side while in a flat bed without using bedrails?: None Help needed moving from lying on your back to sitting on the side of a flat bed without using bedrails?: None Help needed moving to and from a bed to a chair (including a wheelchair)?: None Help needed standing up from a chair  using your arms (e.g., wheelchair or bedside chair)?: None Help needed to walk in hospital room?: A Little Help needed climbing 3-5 steps with a railing? : A Lot 6 Click Score: 21    End of Session Equipment Utilized During Treatment: Other (comment) (vitals machine) Activity Tolerance: Patient tolerated treatment well (though got panicky during walk) Patient left: in bed;with call bell/phone within reach;with family/visitor present Nurse Communication: Mobility status PT Visit Diagnosis: Other abnormalities of gait and mobility (R26.89);Muscle weakness (generalized) (M62.81)     Time: 0102-7253 PT Time Calculation (min) (ACUTE ONLY): 20 min  Charges:  $Gait Training: 8-22 mins                     Roney Marion, PT  Acute Rehabilitation Services Office [place] 10/07/2021, 4:06 PM

## 2021-10-07 NOTE — Plan of Care (Signed)
  Problem: Activity: Goal: Risk for activity intolerance will decrease Outcome: Progressing   

## 2021-10-08 ENCOUNTER — Inpatient Hospital Stay: Payer: Medicare Other | Admitting: Internal Medicine

## 2021-10-08 DIAGNOSIS — T50901A Poisoning by unspecified drugs, medicaments and biological substances, accidental (unintentional), initial encounter: Secondary | ICD-10-CM | POA: Diagnosis not present

## 2021-10-08 DIAGNOSIS — T40601A Poisoning by unspecified narcotics, accidental (unintentional), initial encounter: Secondary | ICD-10-CM

## 2021-10-08 DIAGNOSIS — R262 Difficulty in walking, not elsewhere classified: Secondary | ICD-10-CM | POA: Diagnosis not present

## 2021-10-08 DIAGNOSIS — F419 Anxiety disorder, unspecified: Secondary | ICD-10-CM

## 2021-10-08 DIAGNOSIS — F32A Depression, unspecified: Secondary | ICD-10-CM

## 2021-10-08 DIAGNOSIS — I10 Essential (primary) hypertension: Secondary | ICD-10-CM | POA: Diagnosis not present

## 2021-10-08 LAB — CBC WITH DIFFERENTIAL/PLATELET
Abs Immature Granulocytes: 0.03 10*3/uL (ref 0.00–0.07)
Basophils Absolute: 0 10*3/uL (ref 0.0–0.1)
Basophils Relative: 0 %
Eosinophils Absolute: 0.1 10*3/uL (ref 0.0–0.5)
Eosinophils Relative: 2 %
HCT: 42.3 % (ref 36.0–46.0)
Hemoglobin: 14.5 g/dL (ref 12.0–15.0)
Immature Granulocytes: 1 %
Lymphocytes Relative: 21 %
Lymphs Abs: 1.2 10*3/uL (ref 0.7–4.0)
MCH: 28.9 pg (ref 26.0–34.0)
MCHC: 34.3 g/dL (ref 30.0–36.0)
MCV: 84.3 fL (ref 80.0–100.0)
Monocytes Absolute: 0.6 10*3/uL (ref 0.1–1.0)
Monocytes Relative: 11 %
Neutro Abs: 3.5 10*3/uL (ref 1.7–7.7)
Neutrophils Relative %: 65 %
Platelets: 256 10*3/uL (ref 150–400)
RBC: 5.02 MIL/uL (ref 3.87–5.11)
RDW: 14.2 % (ref 11.5–15.5)
WBC: 5.4 10*3/uL (ref 4.0–10.5)
nRBC: 0 % (ref 0.0–0.2)

## 2021-10-08 LAB — COMPREHENSIVE METABOLIC PANEL
ALT: 14 U/L (ref 0–44)
AST: 22 U/L (ref 15–41)
Albumin: 3.7 g/dL (ref 3.5–5.0)
Alkaline Phosphatase: 83 U/L (ref 38–126)
Anion gap: 12 (ref 5–15)
BUN: 10 mg/dL (ref 8–23)
CO2: 33 mmol/L — ABNORMAL HIGH (ref 22–32)
Calcium: 9.6 mg/dL (ref 8.9–10.3)
Chloride: 92 mmol/L — ABNORMAL LOW (ref 98–111)
Creatinine, Ser: 0.72 mg/dL (ref 0.44–1.00)
GFR, Estimated: >60 mL/min (ref >60)
Glucose, Bld: 127 mg/dL — ABNORMAL HIGH (ref 70–99)
Potassium: 3.2 mmol/L — ABNORMAL LOW (ref 3.5–5.1)
Sodium: 137 mmol/L (ref 135–145)
Total Bilirubin: 1.2 mg/dL (ref 0.3–1.2)
Total Protein: 6.9 g/dL (ref 6.5–8.1)

## 2021-10-08 LAB — MAGNESIUM: Magnesium: 1.8 mg/dL (ref 1.7–2.4)

## 2021-10-08 LAB — PHOSPHORUS: Phosphorus: 3.8 mg/dL (ref 2.5–4.6)

## 2021-10-08 MED ORDER — SODIUM CHLORIDE 0.9 % IV SOLN: INTRAVENOUS | Status: DC

## 2021-10-08 MED ORDER — LOPERAMIDE HCL 2 MG PO CAPS
2.0000 mg | ORAL_CAPSULE | Freq: Four times a day (QID) | ORAL | Status: DC | PRN
Start: 2021-10-08 — End: 2021-10-08
  Administered 2021-10-08 (×2): 2 mg via ORAL
  Filled 2021-10-08 (×3): qty 1

## 2021-10-08 MED ORDER — POTASSIUM CHLORIDE CRYS ER 20 MEQ PO TBCR
40.0000 meq | EXTENDED_RELEASE_TABLET | Freq: Two times a day (BID) | ORAL | Status: DC
  Administered 2021-10-08: 40 meq via ORAL
  Filled 2021-10-08: qty 2

## 2021-10-08 MED ORDER — DIAZEPAM 5 MG PO TABS: 2.5000 mg | ORAL_TABLET | Freq: Two times a day (BID) | ORAL | 3 refills | Status: DC | PRN

## 2021-10-08 MED ORDER — OXYCODONE HCL 10 MG PO TABS: 5.0000 mg | ORAL_TABLET | Freq: Three times a day (TID) | ORAL | 0 refills | Status: DC

## 2021-10-08 NOTE — Progress Notes (Signed)
RN reviewed AVS instructions and discharge information including prescriptions medications, home health followup and follow-up appt with PCP with patient using teach-back method.  Prescription medications also reviewed with patient's son and patients sister.  All questions answered. IV removed without incidence.  Patient alert and oriented x 4 on room air.  Patient taken to private vehicle driven by son via wheelchair by RN.

## 2021-10-08 NOTE — Progress Notes (Signed)
Physical Therapy Treatment Patient Details Name: Bianca Haley MRN: [id number] DOB: [birth year] Today's Date: 10/08/2021   History of Present Illness Bianca Haley is a 71 y.o. female with medical history significant for paroxysmal A-fib on Eliquis, CKD 3B, essential hypertension, hyperlipidemia, chronic narcotic dependence, moderate mitral regurgitation, moderate aortic regurgitation, chronic hypoxia on 2 L nasal cannula PRN, who presented to [facility] ED after being found unresponsive in her bed at her apartment.  Suspected accidental opioid overdose.  The patient is on home oxycodone 10 mg TID PRN as well as Valium 5 mg daily PRN.  She received Narcan via EMS with good response.    PT Comments    Today's skilled session continued to address mobility and gait with less assistance needed and pt with decreased anxiety. Pt hopeful to discharge home today if her stomach issues improve. Acute PT to continue during pt's hospital stay.    Recommendations for follow up therapy are one component of a multi-disciplinary discharge planning process, led by the attending physician.  Recommendations may be updated based on patient status, additional functional criteria and insurance authorization.  Follow Up Recommendations  Home health PT [facility])     Assistance Recommended at Discharge Frequent or constant Supervision/Assistance  Patient can return home with the following Assistance with cooking/housework;Direct supervision/assist for medications management;Direct supervision/assist for financial management;Assist for transportation;Other (comment)   Equipment Recommendations  Other (comment) (? rollator per PT eval)    Recommendations for Other Services OT consult     Precautions / Restrictions Precautions Precautions: None Precaution Comments: did better today with less anxiety Restrictions Weight Bearing Restrictions: No     Mobility  Bed Mobility Overal bed mobility: Independent        Supine to sit: Modified independent (Device/Increase time) Sit to supine: Modified independent (Device/Increase time)   General bed mobility comments: HOB flat, no bed features or assist/cues needed    Transfers Overall transfer level: Needs assistance Equipment used: None   Sit to Stand: Modified independent (Device/Increase time)           General transfer comment: no assist or cues needed, no balance issues noted    Ambulation/Gait Ambulation/Gait assistance: Supervision Gait Distance (Feet): 100 Feet Assistive device: None Gait Pattern/deviations: Step-through pattern Gait velocity: decreased     General Gait Details: no device used with no balance issues noted this session. limited distance from room as pt reports having "issues with diarrhea".       Cognition Arousal/Alertness: Awake/alert Behavior During Therapy: WFL for tasks assessed/performed Overall Cognitive Status: No family/caregiver present to determine baseline cognitive functioning                Pertinent Vitals/Pain Pain Assessment Pain Assessment: No/denies pain     PT Goals (current goals can now be found in the care plan section) Acute Rehab PT Goals Patient Stated Goal: agreeable to amb PT Goal Formulation: Patient unable to participate in goal setting Time For Goal Achievement: 10/19/21 Potential to Achieve Goals: Good Progress towards PT goals: Progressing toward goals    Frequency    Min 3X/week      PT Plan Current plan remains appropriate    AM-PAC PT "6 Clicks" Mobility   Outcome Measure  Help needed turning from your back to your side while in a flat bed without using bedrails?: None Help needed moving from lying on your back to sitting on the side of a flat bed without using bedrails?: None Help needed moving to and from  a bed to a chair (including a wheelchair)?: None Help needed standing up from a chair using your arms (e.g., wheelchair or bedside chair)?:  None Help needed to walk in hospital room?: None Help needed climbing 3-5 steps with a railing? : A Lot 6 Click Score: 22    End of Session Equipment Utilized During Treatment:  (PTA pushed IV pole) Activity Tolerance: Patient tolerated treatment well Patient left: in bed;with call bell/phone within reach Nurse Communication: Mobility status PT Visit Diagnosis: Other abnormalities of gait and mobility (R26.89);Muscle weakness (generalized) (M62.81)     Time: 7939-0300 PT Time Calculation (min) (ACUTE ONLY): 12 min  Charges:  $Gait Training: 8-22 mins                    Willow Ora, PTA, [facility] Acute [employer] [phone] 10/08/21, 10:32 AM   Willow Ora 10/08/2021, 10:31 AM

## 2021-10-08 NOTE — Progress Notes (Signed)
Occupational Therapy Treatment Patient Details Name: Bianca Haley MRN: [id number] DOB: 30-Nov-1950 Today's Date: 10/08/2021   History of present illness Bianca Haley is a 71 y.o. female with medical history significant for paroxysmal A-fib on Eliquis, CKD 3B, essential hypertension, hyperlipidemia, chronic narcotic dependence, moderate mitral regurgitation, moderate aortic regurgitation, chronic hypoxia on 2 L nasal cannula PRN, who presented to [facility] ED after being found unresponsive in her bed at her apartment.  Suspected accidental opioid overdose.  The patient is on home oxycodone 10 mg TID PRN as well as Valium 5 mg daily PRN.  She received Narcan via EMS with good response.   OT comments  Pt currently at supervision level overall for selfcare tasks without use of an assistive device.  Recommend initial supervision for safety at home as pt still demonstrates some cognitive impairment with decreased short term memory with decreased anticipatory awareness.  Will benefit from acute care OT with [facility] recommended for follow-up to continue evaluation of ADLs and home management within a familiar environment.     Recommendations for follow up therapy are one component of a multi-disciplinary discharge planning process, led by the attending physician.  Recommendations may be updated based on patient status, additional functional criteria and insurance authorization.    Follow Up Recommendations  Home health OT    Assistance Recommended at Discharge Frequent or constant Supervision/Assistance  Patient can return home with the following  A little help with walking and/or transfers;A little help with bathing/dressing/bathroom;Assistance with cooking/housework;Assist for transportation;Direct supervision/assist for financial management;Direct supervision/assist for medications management   Equipment Recommendations  None recommended by OT       Precautions / Restrictions Precautions Precautions:  None Precaution Comments: did better today with less anxiety Restrictions Weight Bearing Restrictions: No       Mobility Bed Mobility Overal bed mobility: Modified Independent Bed Mobility: Supine to Sit, Sit to Supine     Supine to sit: Modified independent (Device/Increase time) Sit to supine: Modified independent (Device/Increase time)        Transfers Overall transfer level: Needs assistance Equipment used: None Transfers: Sit to/from Stand Sit to Stand: Modified independent (Device/Increase time) Stand pivot transfers: Supervision         General transfer comment: Supervision for ambulation to the sink and bathroom secondary to IV     Balance Overall balance assessment: Needs assistance Sitting-balance support: No upper extremity supported, Feet supported Sitting balance-Leahy Scale: Normal     Standing balance support: During functional activity Standing balance-Leahy Scale: Good Standing balance comment: Pt able to maintain balance in standing without assistive device as well as maintaining balance with eyes closed and feet apart.  Did not tolerate further balance testing secondary to nausea.  Attempted Berg but unable to tolerate.                 Standardized Balance Assessment Standardized Balance Assessment : Berg Balance Test Berg Balance Test Sit to Stand: Able to stand  independently using hands Sitting with Back Unsupported but Feet Supported on Floor or Stool: Able to sit safely and securely 2 minutes       ADL either performed or assessed with clinical judgement   ADL Overall ADL's : Needs assistance/impaired     Grooming: Wash/dry face;Wash/dry hands;Oral care;Supervision/safety;Standing               Lower Body Dressing: Supervision/safety   Toilet Transfer: Supervision/safety;Regular Toilet;Grab bars   Toileting- Water quality scientist and Hygiene: Supervision/safety;Sit to/from stand   Tub/  Shower Transfer:  Supervision/safety;Ambulation Tub/Shower Transfer Details (indicate cue type and reason): simulated stepping over a threshold Functional mobility during ADLs: Supervision/safety (No assistive device in the room) General ADL Comments: Pt with slight difficutly stepping over the edge of the simluated tub, but able to compensate and complete with supervision when using simulated grab bar.  Discussed need to hold off on driving initially at home secondary to recent cognitive changes.               Cognition Arousal/Alertness: Awake/alert Behavior During Therapy: WFL for tasks assessed/performed Overall Cognitive Status: Impaired/Different from baseline Area of Impairment: Memory, Awareness                     Memory: Decreased short-term memory     Awareness: Anticipatory   General Comments: Pt able to recall 2/3 words after 5 min delay.  She was oriented to place, time, and situation.  Decreased anticipatory awareness noted as pt did not state she needed to go to the bathroom while up, but after getting back to the bed, then stated she needed to go secondary to urgency.                   Pertinent Vitals/ Pain       Pain Assessment Pain Assessment: Faces Faces Pain Scale: No hurt         Frequency  Min 2X/week        Progress Toward Goals  OT Goals(current goals can now be found in the care plan section)  Progress towards OT goals: Progressing toward goals  Acute Rehab OT Goals Patient Stated Goal: Pt is hopefull to go home today. OT Goal Formulation: With patient Time For Goal Achievement: 10/19/21 Potential to Achieve Goals: Good  Plan Discharge plan remains appropriate       AM-PAC OT "6 Clicks" Daily Activity     Outcome Measure   Help from another person eating meals?: None Help from another person taking care of personal grooming?: None Help from another person toileting, which includes using toliet, bedpan, or urinal?: A Little Help from  another person bathing (including washing, rinsing, drying)?: A Little Help from another person to put on and taking off regular upper body clothing?: None Help from another person to put on and taking off regular lower body clothing?: None 6 Click Score: 22    End of Session Equipment Utilized During Treatment: Gait belt  OT Visit Diagnosis: Unsteadiness on feet (R26.81);Other symptoms and signs involving cognitive function;Muscle weakness (generalized) (M62.81)   Activity Tolerance Patient tolerated treatment well;Patient limited by fatigue;Other (comment) (limited by nausea)   Patient Left in bed;with call bell/phone within reach   Nurse Communication Mobility status        Time: 0981-1914 OT Time Calculation (min): 38 min  Charges: OT General Charges $OT Visit: 1 Visit OT Treatments $Self Care/Home Management : 38-52 mins  Ezeriah Luty OTR/L 10/08/2021, 1:48 PM

## 2021-10-08 NOTE — Discharge Summary (Signed)
Physician Discharge Summary  Bianca Haley ZGY:[id number] DOB: Mar 23, 1951 DOA: 10/04/2021  PCP: [name], MD  Admit date: 10/04/2021 Discharge date: 10/08/2021  Admitted From: Home Disposition: Home with home health PT  Recommendations for Outpatient Follow-up:  Follow up with PCP tomorrow as scheduled Changes in pain medication regimen, please take medications as per instruction.  Home Health: PT/OT/RN Equipment/Devices: None.  She has nocturnal oxygen at home  Discharge Condition: Stable CODE STATUS: Full code Diet recommendation: Low-salt diet  Discharge summary: 71 year old with history of paroxysmal A-fib on Eliquis, CKD stage IIIb, essential hypertension, hyperlipidemia, chronic narcotic dependence, chronic nocturnal hypoxemia on 2 L oxygen at home presented to the ER after being found unresponsive in her bed at her apartment with suspected accidental opiate overdose.  Patient is on oxycodone 10 mg 3 times daily and Valium 5 mg daily at home.  She received Narcan with good response.  Narcotic overdose with chronic opiate dependence, polypharmacy: UDS with opiates and benzos.  Oxycodone is prescribed and recently switched from [place].  Excellently might have taken extra doses. Currently in the hospital oxycodone 5 mg 3 times a day as needed, Valium 2.5 mg daily as needed.  Given significant risk with opioid overdose will discharge on reduced dose of oxycodone 5 mg 3 times daily as needed and Valium 2.5 mg twice daily as needed.  Discontinue tizanidine. Patient may have previous prescriptions and medications available at home, her family will go through the pillbox and sort out medications.  Also home health RN to see patient at home. Continue gabapentin at night.  All-time fall precautions.  Paroxysmal A-fib: Sinus rhythm on metoprolol and therapeutic on Eliquis.  Essential hypertension: Blood pressure is elevated today.  She was on reduced dose of home medications.  She  will resume metoprolol, Cardizem and irbesartan.  Ambulatory dysfunction: Work with PT OT.  She will benefit with home health PT OT.  Discussed with her son, she will be going home with him.  Discharge Diagnoses:  Principal Problem:   Accidental overdose Active Problems:   Acute respiratory failure with hypoxia (HCC)   Narcotic overdose (HCC)   Polypharmacy   Troponin level elevated   Prediabetes   Paroxysmal atrial fibrillation (HCC)   Essential hypertension   Anxiety and depression   Ambulatory dysfunction   Dependence on nocturnal oxygen therapy    Discharge Instructions  Discharge Instructions     Diet - low sodium heart healthy   Complete by: As directed    Increase activity slowly   Complete by: As directed       Allergies as of 10/08/2021       Reactions   Penicillins Shortness Of Breath, Rash   Did it involve swelling of the face/tongue/throat, SOB, or low BP? No Did it involve sudden or severe rash/hives, skin peeling, or any reaction on the inside of your mouth or nose? Yes Did you need to seek medical attention at a hospital or doctor's office? No When did it last happen?      10 + years If all above answers are "NO", may proceed with cephalosporin use.   Morphine And Related Swelling   Sedation/Swelling described like edema/bloating/doesn't help the pain Morphine only; tolerates other opioids.    Cefepime Rash   Reaction while taking both cefepime and vancomycin at [facility] (after 3 days)   Vancomycin Rash   Reaction while taking both cefepime and vancomycin at [facility] (after 3 days)        Medication  List     STOP taking these medications    feeding supplement Liqd   tiZANidine 2 MG tablet Commonly known as: ZANAFLEX   traZODone 150 MG tablet Commonly known as: DESYREL       TAKE these medications    acetaminophen 650 MG CR tablet Commonly known as: TYLENOL Take 1,300 mg by mouth every 8 (eight) hours as needed for pain.   albuterol 108  (90 Base) MCG/ACT inhaler Commonly known as: Ventolin HFA INHALE 2 PUFFS INTO THE LUNGS EVERY 4 HOURS AS NEEDED FOR WHEEZING OR SHORTNESS OF BREATH What changed:  how much to take how to take this when to take this reasons to take this additional instructions   apixaban 5 MG Tabs tablet Commonly known as: ELIQUIS Take 1 tablet (5 mg total) by mouth 2 (two) times daily.   benzonatate 100 MG capsule Commonly known as: TESSALON Take 1 capsule (100 mg total) by mouth 3 (three) times daily as needed for cough.   Calcium Plus Vitamin D3 600-12.5 MG-MCG Caps Generic drug: Calcium Carb-Cholecalciferol Take 2 tablets by mouth every morning.   cycloSPORINE 0.05 % ophthalmic emulsion Commonly known as: RESTASIS Place 1 drop into both eyes every morning.   diazepam 5 MG tablet Commonly known as: VALIUM Take 0.5 tablets (2.5 mg total) by mouth every 12 (twelve) hours as needed for anxiety or muscle spasms. What changed:  how much to take when to take this   diltiazem 240 MG 24 hr capsule Commonly known as: CARDIZEM CD Take 1 capsule (240 mg total) by mouth every morning.   fluticasone 50 MCG/ACT nasal spray Commonly known as: FLONASE Place 2 sprays into both nostrils daily as needed for allergies or rhinitis.   gabapentin 300 MG capsule Commonly known as: NEURONTIN Take 4 capsules (1,200 mg total) by mouth at bedtime.   guaiFENesin 600 MG 12 hr tablet Commonly known as: Mucinex Take 1 tablet (600 mg total) by mouth 2 (two) times daily. What changed:  when to take this reasons to take this   irbesartan 150 MG tablet Commonly known as: AVAPRO Take 1 tablet (150 mg total) by mouth daily.   Melatonin 10 MG Subl Place 10 mg under the tongue at bedtime.   metoprolol tartrate 50 MG tablet Commonly known as: LOPRESSOR Take 1 tablet (50 mg total) by mouth 2 (two) times daily.   mirabegron ER 50 MG Tb24 tablet Commonly known as: MYRBETRIQ Take 50 mg by mouth every morning.    multivitamin with minerals Tabs tablet Take 1 tablet by mouth 2 (two) times daily.   OVER THE COUNTER MEDICATION Take 2 capsules by mouth in the morning. [place]   Oxycodone HCl 10 MG Tabs Take 0.5 tablets (5 mg total) by mouth 3 (three) times daily. What changed: how much to take   pantoprazole 40 MG tablet Commonly known as: PROTONIX TAKE 1 TABLET(40 MG) BY MOUTH TWICE DAILY BEFORE A MEAL What changed: See the new instructions.   PARoxetine 40 MG tablet Commonly known as: PAXIL TAKE 1 TABLET(40 MG) BY MOUTH TWICE DAILY What changed: See the new instructions.   potassium chloride 10 MEQ tablet Commonly known as: KLOR-CON TAKE 1 TABLET BY MOUTH DAILY What changed:  how to take this when to take this   Probiotic Caps Take 1 capsule by mouth every morning.   rosuvastatin 5 MG tablet Commonly known as: CRESTOR TAKE 1 TABLET(5 MG) BY MOUTH DAILY What changed: See the new instructions.   SUMAtriptan  100 MG tablet Commonly known as: IMITREX TAKE 1 TABLET BY MOUTH DAILY AS NEEDED FOR MIGRAINE. MAY REPEAT IN 2 HOURS IF HEADACHE PERSISTS OR RECURS What changed: See the new instructions.   Systane 0.4-0.3 % Gel ophthalmic gel Generic drug: Polyethyl Glycol-Propyl Glycol Place 1 application. into both eyes at bedtime.   vitamin B-12 1000 MCG tablet Commonly known as: CYANOCOBALAMIN Take 1,000 mcg by mouth every morning.   VITAMIN C PO Take 1 tablet by mouth every morning.   Vitamin D 50 MCG (2000 UT) tablet Take 2,000 Units by mouth at bedtime.        Follow-up Information     [facility], [facility] Follow up.   Specialty: Home Health Services Why: Someone will call you to schedule first home visit. Contact information: [address] [place] [address] [phone]                Allergies  Allergen Reactions   Penicillins Shortness Of Breath and Rash    Did it involve swelling of the face/tongue/throat, SOB, or low BP? No Did  it involve sudden or severe rash/hives, skin peeling, or any reaction on the inside of your mouth or nose? Yes Did you need to seek medical attention at a hospital or doctor's office? No When did it last happen?      10 + years If all above answers are "NO", may proceed with cephalosporin use.     Morphine And Related Swelling    Sedation/Swelling described like edema/bloating/doesn't help the pain Morphine only; tolerates other opioids.    Cefepime Rash    Reaction while taking both cefepime and vancomycin at [facility] (after 3 days)   Vancomycin Rash    Reaction while taking both cefepime and vancomycin at [facility] (after 3 days)    Consultations: None   Procedures/Studies: CT Head Wo Contrast  Result Date: 10/04/2021 CLINICAL DATA:  Mental status change, unknown cause unresponsive at home - recently started on A/C - evaluate for ICH EXAM: CT HEAD WITHOUT CONTRAST TECHNIQUE: Contiguous axial images were obtained from the base of the skull through the vertex without intravenous contrast. RADIATION DOSE REDUCTION: This exam was performed according to the departmental dose-optimization program which includes automated exposure control, adjustment of the mA and/or kV according to patient size and/or use of iterative reconstruction technique. COMPARISON:  MRI head 12/28/2017 report without imaging, CT head 07/25/2020 BRAIN: BRAIN No evidence of large-territorial acute infarction. No parenchymal hemorrhage. No mass lesion. No extra-axial collection. No mass effect or midline shift. No hydrocephalus. Basilar cisterns are patent. Vascular: No hyperdense vessel. Skull: No acute fracture or focal lesion. Limited evaluation for skull base fracture due to motion artifact. The Sinuses/Orbits: Paranasal sinuses and mastoid air cells are clear. Bilateral lens replacement. The orbits are unremarkable. Other: None. IMPRESSION: No acute intracranial abnormality. Electronically Signed   By: Iven Finn M.D.   On:  10/04/2021 17:17   CT Angio Chest Pulmonary Embolism (PE) W or WO Contrast  Result Date: 10/05/2021 CLINICAL DATA:  Chest pain or SOB, pleurisy or effusion suspected EXAM: CT ANGIOGRAPHY CHEST WITH CONTRAST TECHNIQUE: Multidetector CT imaging of the chest was performed using the standard protocol during bolus administration of intravenous contrast. Multiplanar CT image reconstructions and MIPs were obtained to evaluate the vascular anatomy. RADIATION DOSE REDUCTION: This exam was performed according to the departmental dose-optimization program which includes automated exposure control, adjustment of the mA and/or kV according to patient size and/or use of iterative  reconstruction technique. CONTRAST:  75m OMNIPAQUE IOHEXOL 350 MG/ML SOLN COMPARISON:  CT dated March 10, 2021 FINDINGS: Cardiovascular: Evaluation of the bases is limited secondary to respiratory motion. No acute pulmonary embolism through the segmental pulmonary arteries. Heart is at the upper limits of normal. Scattered soft and calcified atherosclerotic plaque throughout the course of the nonaneurysmal thoracic aorta. No pericardial effusion. Mediastinum/Nodes: No axillary adenopathy. Visualized thyroid is unremarkable. Increase in size of multiple mediastinal lymph nodes. Representative pretracheal lymph node measures d 13 mm in the short axis, previously 6 mm (series 6, image 79). Additional representative pretracheal lymph node measures 15 mm in the short axis, previously 10 mm (series 6, image 108). RIGHT hilar lymph node measures 14 mm in the short axis (series 6, image 141). Lungs/Pleura: Trace RIGHT pleural effusion. There is bronchial wall thickening. Multifocal endobronchial debris is noted. Hypoenhancing centrilobular consolidative opacities most predominately within the bases. Scattered ground-glass opacities are noted in the apices. There is a likely background of mosaic attenuation, likely reflecting air trapping. Several  consolidative opacities are more nodular in appearance with representative consolidative opacity versus nodule measuring 6 mm (series 6, image 170). Upper Abdomen: Small hiatal hernia. Mildly nodular contour of the liver with hypertrophy of the LEFT liver could reflect underlying cirrhosis Musculoskeletal: Degenerative changes of the thoracic spine. Review of the MIP images confirms the above findings. IMPRESSION: 1. No acute pulmonary embolism. 2. There is bronchial wall thickening with endobronchial debris and multifocal hypoenhancing consolidative opacities. Findings likely reflect infection versus aspiration. Given nodular appearance of several opacities, recommend follow-up CT in 3 months to assess for appropriate resolution. 3. Interval increase in size of multiple mediastinal lymph nodes. This is likely reactive in etiology. Recommend attention on CT. 4. Trace RIGHT pleural effusion. 5. Morphologic changes of the liver which could reflect underlying cirrhosis. Aortic Atherosclerosis (ICD10-I70.0). Electronically Signed   By: SValentino SaxonM.D.   On: 10/05/2021 15:36   DG Chest Portable 1 View  Result Date: 10/04/2021 CLINICAL DATA:  Altered mental status.  Possible aspiration. EXAM: PORTABLE CHEST 1 VIEW COMPARISON:  09/02/2021 and prior radiographs FINDINGS: UPPER limits normal heart size and peribronchial thickening again noted. There is no evidence of focal airspace disease, pulmonary edema, suspicious pulmonary nodule/mass, pleural effusion, or pneumothorax. No acute bony abnormalities are identified. IMPRESSION: 1. No evidence of acute cardiopulmonary disease. 2. Chronic peribronchial thickening. Electronically Signed   By: JMargarette CanadaM.D.   On: 10/04/2021 16:26   (Echo, Carotid, EGD, Colonoscopy, ERCP)    Subjective: Patient seen and examined.  No overnight events.  She had some nausea and abdominal discomfort that improved after taking 4 mg of Imodium.  Eager to go home.  Patient  requested a TB test, gold QuantiFERON test was drawn before discharge to be followed up by her PCP as patient is looking forward to moved to assisted living facility.   Discharge Exam: Vitals:   10/08/21 0936 10/08/21 1000  BP: (!) 215/91 (!) 192/89  Pulse: 79 80  Resp: 18 18  Temp: 98.4 F (36.9 C) 98.6 F (37 C)  SpO2: 95% 95%   Vitals:   10/07/21 2053 10/08/21 0438 10/08/21 0936 10/08/21 1000  BP: (!) 176/91 (!) 179/94 (!) 215/91 (!) 192/89  Pulse: 91 91 79 80  Resp: '17 18 18 18  '$ Temp: 98.3 F (36.8 C) 98.6 F (37 C) 98.4 F (36.9 C) 98.6 F (37 C)  TempSrc: Oral Oral Oral   SpO2: 96% 96% 95% 95%  Weight:  Height:        General: Pt is alert, awake, not in acute distress Cardiovascular: RRR, S1/S2 +, no rubs, no gallops Respiratory: CTA bilaterally, no wheezing, no rhonchi Abdominal: Soft, NT, ND, bowel sounds + Extremities: no edema, no cyanosis    The results of significant diagnostics from this hospitalization (including imaging, microbiology, ancillary and laboratory) are listed below for reference.     Microbiology: No results found for this or any previous visit (from the past 240 hour(s)).   Labs: BNP (last 3 results) Recent Labs    03/09/21 1838  BNP 48.5   Basic Metabolic Panel: Recent Labs  Lab 10/04/21 1553 10/04/21 1616 10/05/21 0232 10/06/21 0109 10/07/21 0600 10/08/21 0503  NA 138 139 140 141 140 137  K 4.1 4.1 4.2 4.1 4.2 3.2*  CL 102  --  105 102 95* 92*  CO2 24  --  26 30 32 33*  GLUCOSE 152*  --  102* 99 103* 127*  BUN 10  --  10 <5* 6* 10  CREATININE 1.19*  --  0.95 0.65 0.70 0.72  CALCIUM 9.3  --  8.7* 9.0 9.8 9.6  MG  --   --  2.0 1.6* 1.9 1.8  PHOS  --   --  4.0 2.8 3.6 3.8   Liver Function Tests: Recent Labs  Lab 10/05/21 0232 10/06/21 0109 10/07/21 0600 10/08/21 0503  AST '24 23 23 22  '$ ALT '14 13 14 14  '$ ALKPHOS 74 77 84 83  BILITOT 0.7 1.0 1.2 1.2  PROT 6.1* 6.1* 7.3 6.9  ALBUMIN 3.3* 3.2* 3.7 3.7   No  results for input(s): LIPASE, AMYLASE in the last 168 hours. No results for input(s): AMMONIA in the last 168 hours. CBC: Recent Labs  Lab 10/04/21 1616 10/04/21 1656 10/06/21 0109 10/07/21 0600 10/08/21 0503  WBC  --  7.2 5.8 4.5 5.4  NEUTROABS  --  5.6 4.1 3.2 3.5  HGB 13.9 13.3 11.7* 14.4 14.5  HCT 41.0 40.5 35.8* 41.3 42.3  MCV  --  88.8 89.3 84.6 84.3  PLT  --  220 146* 212 256   Cardiac Enzymes: No results for input(s): CKTOTAL, CKMB, CKMBINDEX, TROPONINI in the last 168 hours. BNP: Invalid input(s): POCBNP CBG: Recent Labs  Lab 10/05/21 1621  GLUCAP 103*   D-Dimer No results for input(s): DDIMER in the last 72 hours. Hgb A1c No results for input(s): HGBA1C in the last 72 hours. Lipid Profile No results for input(s): CHOL, HDL, LDLCALC, TRIG, CHOLHDL, LDLDIRECT in the last 72 hours. Thyroid function studies No results for input(s): TSH, T4TOTAL, T3FREE, THYROIDAB in the last 72 hours.  Invalid input(s): FREET3 Anemia work up No results for input(s): VITAMINB12, FOLATE, FERRITIN, TIBC, IRON, RETICCTPCT in the last 72 hours. Urinalysis    Component Value Date/Time   COLORURINE AMBER (A) 07/25/2020 2214   APPEARANCEUR CLOUDY (A) 07/25/2020 2214   LABSPEC 1.023 07/25/2020 2214   PHURINE 5.0 07/25/2020 2214   GLUCOSEU NEGATIVE 07/25/2020 2214   GLUCOSEU NEGATIVE 04/03/2020 1202   HGBUR NEGATIVE 07/25/2020 2214   BILIRUBINUR NEGATIVE 07/25/2020 2214   KETONESUR 20 (A) 07/25/2020 2214   PROTEINUR 100 (A) 07/25/2020 2214   UROBILINOGEN 0.2 04/03/2020 1202   NITRITE NEGATIVE 07/25/2020 2214   LEUKOCYTESUR NEGATIVE 07/25/2020 2214   Sepsis Labs Invalid input(s): PROCALCITONIN,  WBC,  LACTICIDVEN Microbiology No results found for this or any previous visit (from the past 240 hour(s)).   Time coordinating discharge: 32 minutes  SIGNED:  Barb Merino, MD  [facility] 10/08/2021, 2:55 PM

## 2021-10-08 NOTE — TOC Transition Note (Signed)
Transition of Care [facility]) - CM/SW Discharge Note   Patient Details  Name: SAMYIA MOTTER MRN: [id number] Date of Birth: February 05, 1951  [facility]) CM/SW Contact:  Tom-Johnson, Renea Ee, RN Phone Number: 10/08/2021, 4:03 PM   Clinical Narrative:     Patient is scheduled for discharge today. Home health with Al[facility], info on AVS. PT recommended Rollator, patient states she has one. Denies any other needs. Son and sister at bedside to transport. No further TOC needs noted.    Final next level of care: [place] Barriers to Discharge: Barriers Resolved   Patient Goals and CMS Choice Patient states their goals for this hospitalization and ongoing recovery are:: To return home CMS Medicare.gov Compare Post Acute Care list provided to:: Patient Choice offered to / list presented to : Patient  Discharge Placement                Patient to be transferred to facility by: Family      Discharge Plan and Services   Discharge Planning Services: CM Consult            DME Arranged: N/A DME Agency: NA       HH Arranged: PT, OT, RN, Nurse's Aide [place] Agency: [place] Date [facility] Agency Contacted: 10/06/21 Time [place]: [id number] Representative spoke with at [place]: [place] Interventions     Readmission Risk Interventions    10/06/2021    1:15 PM 09/05/2021    4:17 PM 03/11/2021   10:22 AM  Readmission Risk Prevention Plan  Transportation Screening Complete Complete Complete  PCP or Specialist Appt within 3-5 Days  Complete Complete  HRI or [place]  Complete Complete  Social Work Consult for [place] Planning/Counseling  Complete Complete  Palliative Care Screening  Not Applicable Complete  Medication Review Press photographer) Complete Complete Complete  PCP or Specialist appointment within 3-5 days of discharge Complete    HRI or [place] Complete    SW Recovery  Care/Counseling Consult Complete    [place] Not Applicable

## 2021-10-08 NOTE — Progress Notes (Signed)
  Mobility Specialist Criteria Algorithm Info.   10/08/21 1422  Mobility  Activity Ambulated with assistance in hallway  Range of Motion/Exercises Active;All extremities  Level of Assistance Standby assist, set-up cues, supervision of patient - no hands on  Assistive Device None  Distance Ambulated (ft) 60 ft  Activity Response Tolerated well   Patient received in supine agreeable to participate in mobility. Ambulated short distance in hallway supervision level with steady gait. Returned to room without complaint or incident. Was left in bed with all needs met, call bell in reach.   10/08/2021 2:22 PM  [place] Shanell Aden, [place], [place]  PNPYY:[phone] Office: [phone]

## 2021-10-08 NOTE — Care Management Important Message (Signed)
Important Message  Patient Details  Name: Bianca Haley MRN: [id number] Date of Birth: 1950/09/05   Medicare Important Message Given:  Yes     Orbie Pyo 10/08/2021, 3:28 PM

## 2021-10-09 ENCOUNTER — Ambulatory Visit (INDEPENDENT_AMBULATORY_CARE_PROVIDER_SITE_OTHER): Payer: Medicare Other | Admitting: Internal Medicine

## 2021-10-09 ENCOUNTER — Telehealth: Payer: Self-pay

## 2021-10-09 ENCOUNTER — Encounter: Payer: Self-pay | Admitting: Internal Medicine

## 2021-10-09 DIAGNOSIS — T50901A Poisoning by unspecified drugs, medicaments and biological substances, accidental (unintentional), initial encounter: Secondary | ICD-10-CM | POA: Diagnosis not present

## 2021-10-09 DIAGNOSIS — M542 Cervicalgia: Secondary | ICD-10-CM | POA: Diagnosis not present

## 2021-10-09 DIAGNOSIS — M549 Dorsalgia, unspecified: Secondary | ICD-10-CM

## 2021-10-09 DIAGNOSIS — R410 Disorientation, unspecified: Secondary | ICD-10-CM

## 2021-10-09 DIAGNOSIS — F1120 Opioid dependence, uncomplicated: Secondary | ICD-10-CM

## 2021-10-09 DIAGNOSIS — G8929 Other chronic pain: Secondary | ICD-10-CM

## 2021-10-09 MED ORDER — SUMATRIPTAN SUCCINATE 100 MG PO TABS: ORAL_TABLET | ORAL | 5 refills | Status: DC

## 2021-10-09 MED ORDER — DIAZEPAM 5 MG PO TABS
ORAL_TABLET | ORAL | 3 refills | Status: DC
Start: 2021-10-09 — End: 2021-11-05

## 2021-10-09 MED ORDER — DIAZEPAM 5 MG PO TABS
2.5000 mg | ORAL_TABLET | Freq: Two times a day (BID) | ORAL | 3 refills | Status: DC
Start: 2021-10-09 — End: 2021-10-09

## 2021-10-09 NOTE — Assessment & Plan Note (Signed)
10/2021 She  presented to the ER after being found unresponsive in her bed at her apartment by [place] with suspected accidental opiate overdose.  She called ambulance.  Patient is on oxycodone 10 mg 3 times daily and Valium 5 mg daily at home per Pain clinic.  She received Narcan injection with good response.  Oxycodone is prescribed and recently switched from [place] because Opana was unavailable.  Accidentally Jackelyn Poling  might have taken extra doses.  She had more pain in the right shoulder and saw Simeon Craft on 09/30/2021 at the pain clinic.  She was given tizanidine.  She was also taking trazodone at night. At the hospital she was taking oxycodone 5 mg 3 times a day as needed, Valium 2.5 mg daily as needed.  She was discharged home on the same regimen.  Discontinued tizanidine and trazodone. She continues to take gabapentin at the same dose. She is reporting more anxiety.  Apparently she failed a pill test at the hospital.  Her family is planning to place her to [facility] respite care for a month.  I will need to fill out [place] 2 form. FL2 form was filled out.  I do not think we need the TB test because she just had a chest x-ray and a chest CT.

## 2021-10-09 NOTE — Assessment & Plan Note (Addendum)
She  presented to the ER after being found unresponsive in her bed at her apartment by [place] with suspected accidental opiate overdose.  She called ambulance.  Patient is on oxycodone 10 mg 3 times daily and Valium 5 mg daily at home per Pain clinic.  She received Narcan injection with good response.  Oxycodone is prescribed and recently switched from [place] because Opana was unavailable.  Accidentally Jackelyn Poling  might have taken extra doses.  She had more pain in the right shoulder and saw Simeon Craft on 09/30/2021 at the pain clinic.  She was given tizanidine.  She was also taking trazodone at night. At the hospital she was taking oxycodone 5 mg 3 times a day as needed, Valium 2.5 mg daily as needed.  She was discharged home on the same regimen.  Discontinued tizanidine and trazodone. She continues to take gabapentin at the same dose. She is reporting more anxiety.  Apparently she failed a pill test at the hospital.  Her family is planning to place her to [facility] respite care for a month.  I will need to fill out [place] 2 form. FL2 form was filled out.  I do not think we need the TB test because she just had a chest x-ray and a chest CT.

## 2021-10-09 NOTE — Assessment & Plan Note (Signed)
Polypharmacy has contributed.  She  presented to the ER after being found unresponsive in her bed at her apartment by [place] with suspected accidental opiate overdose.  She called ambulance.  Patient is on oxycodone 10 mg 3 times daily and Valium 5 mg daily at home per Pain clinic.  She received Narcan injection with good response.  Oxycodone is prescribed and recently switched from [place] because Opana was unavailable.  Accidentally Jackelyn Poling  might have taken extra doses.  She had more pain in the right shoulder and saw Simeon Craft on 09/30/2021 at the pain clinic.  She was given tizanidine.  She was also taking trazodone at night. At the hospital she was taking oxycodone 5 mg 3 times a day as needed, Valium 2.5 mg daily as needed.  She was discharged home on the same regimen.  Discontinued tizanidine and trazodone. She continues to take gabapentin at the same dose. She is reporting more anxiety.  Apparently she failed a pill test at the hospital.  Her family is planning to place her to [facility] respite care for a month.  I will need to fill out [place] 2 form. FL2 form was filled out.  I do not think we need the TB test because she just had a chest x-ray and a chest CT.

## 2021-10-09 NOTE — Telephone Encounter (Signed)
Transition Care Management Unsuccessful Follow-up Telephone Call  Date of discharge and from where:  [place] 10-08-21 Dx: accidental overdose   Attempts:  1st Attempt  Reason for unsuccessful TCM follow-up call:  Left voice message  Transition Care Management Follow-up Telephone Call Date of discharge and from where:[facility] 10-08-21 Dx: accidental overdose   How have you been since you were released from the hospital? Hanging in there  Any questions or concerns? No  Items Reviewed: Did the pt receive and understand the discharge instructions provided? Yes  Medications obtained and verified? Yes  Other? No  Any new allergies since your discharge? No  Dietary orders reviewed? Yes Do you have support at home? Yes   Home Care and Equipment/Supplies: Were home health services ordered? Yes- PT/ OT/ RN If so, what is the name of the agency? [facility]   Has the agency set up a time to come to the patient's home? no Were any new equipment or medical supplies ordered?  No What is the name of the medical supply agency? na Were you able to get the supplies/equipment? not applicable Do you have any questions related to the use of the equipment or supplies? No  Functional Questionnaire: (I = Independent and D = Dependent) ADLs: I  Bathing/Dressing- I  Meal Prep- I  Eating- I  Maintaining continence- I  Transferring/Ambulation- I  Managing Meds- D  Follow up appointments reviewed:  PCP Hospital f/u appt confirmed? Yes  Scheduled to see Dr Alain Marion on 11-05-21 @ 1120am. [place] [facility] f/u appt confirmed? No  . Are transportation arrangements needed? No  If their condition worsens, is the pt aware to call PCP or go to the Emergency Dept.? Yes Was the patient provided with contact information for the PCP's office or ED? Yes Was to pt encouraged to call back with questions or concerns? Yes

## 2021-10-09 NOTE — Assessment & Plan Note (Signed)
10/2021 Bianca Haley  presented to the ER after being found unresponsive in her bed at her apartment by Bianca Haley with suspected accidental opiate overdose.  Bianca Haley called ambulance.  Patient is on oxycodone 10 mg 3 times daily and Valium 5 mg daily at home per Pain clinic.  Bianca Haley received Narcan injection with good response.  Oxycodone is prescribed and recently switched from Bianca Haley because Opana was unavailable.  Accidentally Bianca Haley  might have taken extra doses.  Bianca Haley had more pain in the right shoulder and saw Bianca Haley on 09/30/2021 at the pain clinic.  Bianca Haley was given tizanidine.  Bianca Haley was also taking trazodone at night. At the hospital Bianca Haley was taking oxycodone 5 mg 3 times a day as needed, Valium 2.5 mg daily as needed.  Bianca Haley was discharged home on the same regimen.  Discontinued tizanidine and trazodone. Bianca Haley continues to take gabapentin at the same dose. Bianca Haley is reporting more anxiety.  Apparently Bianca Haley failed a pill test at the hospital.  Her family is planning to place her to [facility] respite care for a month.  I will need to fill out [place] 2 form. FL2 form was filled out.  I do not think we need the TB test because Bianca Haley just had a chest x-ray and a chest CT.

## 2021-10-09 NOTE — Progress Notes (Signed)
Subjective:  Patient ID: [name], female    DOB: July 10, 1950  Age: 71 y.o. MRN: [id number]  CC: No chief complaint on file.   HPI BEYONKA PITNEY presents for post [facility] visit w/her son Quita Skye and dtr Lysbeth Galas (on the speakerphone) who help w/hx She is a 71 year old with history of paroxysmal A-fib on Eliquis, CKD stage IIIb, essential hypertension, hyperlipidemia, chronic narcotic dependence, chronic nocturnal hypoxemia on 1 L oxygen at home presented to the ER after being found unresponsive in her bed at her apartment by [place] with suspected accidental opiate overdose.  She called ambulance.  Patient is on oxycodone 10 mg 3 times daily and Valium 5 mg daily at home per Pain clinic.  She received Narcan injection with good response.  Oxycodone is prescribed and recently switched from [place] because Opana was unavailable.  Accidentally Jackelyn Poling  might have taken extra doses.  She had more pain in the right shoulder and saw Simeon Craft on 09/30/2021 at the pain clinic.  She was given tizanidine.  She was also taking trazodone at night. At the hospital she was taking oxycodone 5 mg 3 times a day as needed, Valium 2.5 mg daily as needed.  She was discharged home on the same regimen.  Discontinued tizanidine and trazodone. She continues to take gabapentin at the same dose. She is reporting more anxiety.  Apparently she failed a pill test at the hospital.  Her family is planning to place her to [facility] respite care for a month.  I will need to fill out [place] 2 form    Admit date: 10/04/2021 Discharge date: 10/08/2021   Admitted From: Home Disposition: Home with home health PT   Recommendations for Outpatient Follow-up:  Follow up with PCP tomorrow as scheduled Changes in pain medication regimen, please take medications as per instruction.   Home Health: PT/OT/RN Equipment/Devices: None.  She has nocturnal oxygen at home   Discharge Condition: Stable CODE STATUS: Full code Diet  recommendation: Low-salt diet   Discharge summary: 71 year old with history of paroxysmal A-fib on Eliquis, CKD stage IIIb, essential hypertension, hyperlipidemia, chronic narcotic dependence, chronic nocturnal hypoxemia on 2 L oxygen at home presented to the ER after being found unresponsive in her bed at her apartment with suspected accidental opiate overdose.  Patient is on oxycodone 10 mg 3 times daily and Valium 5 mg daily at home.  She received Narcan with good response.   Narcotic overdose with chronic opiate dependence, polypharmacy: UDS with opiates and benzos.  Oxycodone is prescribed and recently switched from [place].  Excellently might have taken extra doses. Currently in the hospital oxycodone 5 mg 3 times a day as needed, Valium 2.5 mg daily as needed.  Given significant risk with opioid overdose will discharge on reduced dose of oxycodone 5 mg 3 times daily as needed and Valium 2.5 mg twice daily as needed.  Discontinue tizanidine. Patient may have previous prescriptions and medications available at home, her family will go through the pillbox and sort out medications.  Also home health RN to see patient at home. Continue gabapentin at night.  All-time fall precautions.   Paroxysmal A-fib: Sinus rhythm on metoprolol and therapeutic on Eliquis.   Essential hypertension: Blood pressure is elevated today.  She was on reduced dose of home medications.  She will resume metoprolol, Cardizem and irbesartan.   Ambulatory dysfunction: Work with PT OT.  She will benefit with home health PT OT.   Discussed with her son, she will  be going home with him.   Discharge Diagnoses:  Principal Problem:   Accidental overdose Active Problems:   Acute respiratory failure with hypoxia (HCC)   Narcotic overdose (HCC)   Polypharmacy   Troponin level elevated   Prediabetes   Paroxysmal atrial fibrillation (HCC)   Essential hypertension   Anxiety and depression   Ambulatory dysfunction    Dependence on nocturnal oxygen therapy       Discharge Instructions   Discharge Instructions       Diet - low sodium heart healthy   Complete by: As directed      Increase activity slowly   Complete by: As directed           Allergies as of 10/08/2021         Reactions    Penicillins Shortness Of Breath, Rash    Did it involve swelling of the face/tongue/throat, SOB, or low BP? No Did it involve sudden or severe rash/hives, skin peeling, or any reaction on the inside of your mouth or nose? Yes Did you need to seek medical attention at a hospital or doctor's office? No When did it last happen?      10 + years If all above answers are "NO", may proceed with cephalosporin use.    Morphine And Related Swelling    Sedation/Swelling described like edema/bloating/doesn't help the pain Morphine only; tolerates other opioids.     Cefepime Rash    Reaction while taking both cefepime and vancomycin at [facility] (after 3 days)    Vancomycin Rash    Reaction while taking both cefepime and vancomycin at [facility] (after 3 days)            Medication List       STOP taking these medications     feeding supplement Liqd    tiZANidine 2 MG tablet Commonly known as: ZANAFLEX    traZODone 150 MG tablet Commonly known as: DESYREL           TAKE these medications     acetaminophen 650 MG CR tablet Commonly known as: TYLENOL Take 1,300 mg by mouth every 8 (eight) hours as needed for pain.    albuterol 108 (90 Base) MCG/ACT inhaler Commonly known as: Ventolin HFA INHALE 2 PUFFS INTO THE LUNGS EVERY 4 HOURS AS NEEDED FOR WHEEZING OR SHORTNESS OF BREATH What changed:  how much to take how to take this when to take this reasons to take this additional instructions    apixaban 5 MG Tabs tablet Commonly known as: ELIQUIS Take 1 tablet (5 mg total) by mouth 2 (two) times daily.    benzonatate 100 MG capsule Commonly known as: TESSALON Take 1 capsule (100 mg total) by mouth 3 (three)  times daily as needed for cough.    Calcium Plus Vitamin D3 600-12.5 MG-MCG Caps Generic drug: Calcium Carb-Cholecalciferol Take 2 tablets by mouth every morning.    cycloSPORINE 0.05 % ophthalmic emulsion Commonly known as: RESTASIS Place 1 drop into both eyes every morning.    diazepam 5 MG tablet Commonly known as: VALIUM Take 0.5 tablets (2.5 mg total) by mouth every 12 (twelve) hours as needed for anxiety or muscle spasms. What changed:  how much to take when to take this    diltiazem 240 MG 24 hr capsule Commonly known as: CARDIZEM CD Take 1 capsule (240 mg total) by mouth every morning.    fluticasone 50 MCG/ACT nasal spray Commonly known as: FLONASE Place 2 sprays into both nostrils  daily as needed for allergies or rhinitis.    gabapentin 300 MG capsule Commonly known as: NEURONTIN Take 4 capsules (1,200 mg total) by mouth at bedtime.    guaiFENesin 600 MG 12 hr tablet Commonly known as: Mucinex Take 1 tablet (600 mg total) by mouth 2 (two) times daily. What changed:  when to take this reasons to take this    irbesartan 150 MG tablet Commonly known as: AVAPRO Take 1 tablet (150 mg total) by mouth daily.    Melatonin 10 MG Subl Place 10 mg under the tongue at bedtime.    metoprolol tartrate 50 MG tablet Commonly known as: LOPRESSOR Take 1 tablet (50 mg total) by mouth 2 (two) times daily.    mirabegron ER 50 MG Tb24 tablet Commonly known as: MYRBETRIQ Take 50 mg by mouth every morning.    multivitamin with minerals Tabs tablet Take 1 tablet by mouth 2 (two) times daily.    OVER THE COUNTER MEDICATION Take 2 capsules by mouth in the morning. Lions Mane    Oxycodone HCl 10 MG Tabs Take 0.5 tablets (5 mg total) by mouth 3 (three) times daily. What changed: how much to take    pantoprazole 40 MG tablet Commonly known as: PROTONIX TAKE 1 TABLET(40 MG) BY MOUTH TWICE DAILY BEFORE A MEAL What changed: See the new instructions.    PARoxetine 40 MG  tablet Commonly known as: PAXIL TAKE 1 TABLET(40 MG) BY MOUTH TWICE DAILY What changed: See the new instructions.    potassium chloride 10 MEQ tablet Commonly known as: KLOR-CON TAKE 1 TABLET BY MOUTH DAILY What changed:  how to take this when to take this    Probiotic Caps Take 1 capsule by mouth every morning.    rosuvastatin 5 MG tablet Commonly known as: CRESTOR TAKE 1 TABLET(5 MG) BY MOUTH DAILY What changed: See the new instructions.    SUMAtriptan 100 MG tablet Commonly known as: IMITREX TAKE 1 TABLET BY MOUTH DAILY AS NEEDED FOR MIGRAINE. MAY REPEAT IN 2 HOURS IF HEADACHE PERSISTS OR RECURS What changed: See the new instructions.    Systane 0.4-0.3 % Gel ophthalmic gel Generic drug: Polyethyl Glycol-Propyl Glycol Place 1 application. into both eyes at bedtime.    vitamin B-12 1000 MCG tablet Commonly known as: CYANOCOBALAMIN Take 1,000 mcg by mouth every morning.    VITAMIN C PO Take 1 tablet by mouth every morning.    Vitamin D 50 MCG (2000 UT) tablet Take 2,000 Units by mouth at bedtime.             Follow-up Information       [facility] Follow up.   Specialty: Home Health Services Why: Someone will call you to schedule first home visit. Contact information: [place] [address] [phone]        Outpatient Medications Prior to Visit  Medication Sig Dispense Refill   acetaminophen (TYLENOL) 650 MG CR tablet Take 1,300 mg by mouth every 8 (eight) hours as needed for pain.     albuterol (VENTOLIN HFA) 108 (90 Base) MCG/ACT inhaler INHALE 2 PUFFS INTO THE LUNGS EVERY 4 HOURS AS NEEDED FOR WHEEZING OR SHORTNESS OF BREATH (Patient taking differently: Inhale 2 puffs into the lungs every 4 (four) hours as needed for wheezing or shortness of breath.) 18 g 2   apixaban (ELIQUIS) 5 MG TABS tablet Take 1 tablet (5 mg total) by mouth 2 (two) times daily. 60 tablet 2   Ascorbic  Acid (VITAMIN C PO) Take 1 tablet by  mouth every morning.     benzonatate (TESSALON) 100 MG capsule Take 1 capsule (100 mg total) by mouth 3 (three) times daily as needed for cough. 20 capsule 0   Calcium Carb-Cholecalciferol (CALCIUM PLUS VITAMIN D3) 600-500 MG-UNIT CAPS Take 2 tablets by mouth every morning.     Cholecalciferol (VITAMIN D) 2000 units tablet Take 2,000 Units by mouth at bedtime.      cycloSPORINE (RESTASIS) 0.05 % ophthalmic emulsion Place 1 drop into both eyes every morning.     diltiazem (CARDIZEM CD) 240 MG 24 hr capsule Take 1 capsule (240 mg total) by mouth every morning. 60 capsule 1   fluticasone (FLONASE) 50 MCG/ACT nasal spray Place 2 sprays into both nostrils daily as needed for allergies or rhinitis.     gabapentin (NEURONTIN) 300 MG capsule Take 4 capsules (1,200 mg total) by mouth at bedtime. 120 capsule 0   guaiFENesin (MUCINEX) 600 MG 12 hr tablet Take 1 tablet (600 mg total) by mouth 2 (two) times daily. (Patient taking differently: Take 600 mg by mouth 2 (two) times daily as needed for cough or to loosen phlegm.) 60 tablet 2   irbesartan (AVAPRO) 150 MG tablet Take 1 tablet (150 mg total) by mouth daily. 30 tablet 2   Melatonin 10 MG SUBL Place 10 mg under the tongue at bedtime.     metoprolol tartrate (LOPRESSOR) 50 MG tablet Take 1 tablet (50 mg total) by mouth 2 (two) times daily. 60 tablet 2   mirabegron ER (MYRBETRIQ) 50 MG TB24 tablet Take 50 mg by mouth every morning.     Multiple Vitamin (MULTIVITAMIN WITH MINERALS) TABS tablet Take 1 tablet by mouth 2 (two) times daily.     OVER THE COUNTER MEDICATION Take 2 capsules by mouth in the morning. [place] Mane     Oxycodone HCl 10 MG TABS Take 0.5 tablets (5 mg total) by mouth 3 (three) times daily. 5 tablet 0   pantoprazole (PROTONIX) 40 MG tablet TAKE 1 TABLET(40 MG) BY MOUTH TWICE DAILY BEFORE A MEAL (Patient taking differently: 40 mg 2 (two) times daily before a meal.) 180 tablet 3   PARoxetine (PAXIL) 40 MG tablet TAKE 1 TABLET(40 MG) BY MOUTH  TWICE DAILY (Patient taking differently: Take 40 mg by mouth 2 (two) times daily.) 180 tablet 3   Polyethyl Glycol-Propyl Glycol (SYSTANE) 0.4-0.3 % GEL ophthalmic gel Place 1 application. into both eyes at bedtime.     potassium chloride (KLOR-CON) 10 MEQ tablet TAKE 1 TABLET BY MOUTH DAILY (Patient taking differently: 10 mEq every morning.) 90 tablet 1   Probiotic CAPS Take 1 capsule by mouth every morning.     rosuvastatin (CRESTOR) 5 MG tablet TAKE 1 TABLET(5 MG) BY MOUTH DAILY (Patient taking differently: Take 5 mg by mouth every morning.) 90 tablet 3   vitamin B-12 (CYANOCOBALAMIN) 1000 MCG tablet Take 1,000 mcg by mouth every morning.     diazepam (VALIUM) 5 MG tablet Take 0.5 tablets (2.5 mg total) by mouth every 12 (twelve) hours as needed for anxiety or muscle spasms. 30 tablet 3   SUMAtriptan (IMITREX) 100 MG tablet TAKE 1 TABLET BY MOUTH DAILY AS NEEDED FOR MIGRAINE. MAY REPEAT IN 2 HOURS IF HEADACHE PERSISTS OR RECURS (Patient taking differently: Take 100 mg by mouth See admin instructions. Take one tablet (100 mg) by mouth at onset of migraine headache, may repeat in 2 hours if headache persists or recurs) 12 tablet 5  No facility-administered medications prior to visit.    ROS: Review of Systems  Objective:  BP 140/82 (BP Location: Left Arm, Patient Position: Sitting, Cuff Size: Large)   Pulse 79   Temp 99.1 F (37.3 C) (Oral)   Ht '5\' 5"'$  (1.651 m)   Wt 133 lb (60.3 kg)   SpO2 90%   BMI 22.13 kg/m   BP Readings from Last 3 Encounters:  10/09/21 140/82  10/08/21 (!) 192/89  10/02/21 130/70    Wt Readings from Last 3 Encounters:  10/09/21 133 lb (60.3 kg)  10/05/21 141 lb 1.5 oz (64 kg)  10/02/21 135 lb (61.2 kg)    Physical Exam  Lab Results  Component Value Date   WBC 5.4 10/08/2021   HGB 14.5 10/08/2021   HCT 42.3 10/08/2021   PLT 256 10/08/2021   GLUCOSE 127 (H) 10/08/2021   CHOL 155 04/03/2020   TRIG 102.0 04/03/2020   HDL 61.40 04/03/2020   LDLCALC  73 04/03/2020   ALT 14 10/08/2021   AST 22 10/08/2021   NA 137 10/08/2021   K 3.2 (L) 10/08/2021   CL 92 (L) 10/08/2021   CREATININE 0.72 10/08/2021   BUN 10 10/08/2021   CO2 33 (H) 10/08/2021   TSH 1.43 09/18/2021   INR 1.0 09/21/2018   HGBA1C 6.1 06/23/2018    CT Angio Chest Pulmonary Embolism (PE) W or WO Contrast  Result Date: 10/05/2021 CLINICAL DATA:  Chest pain or SOB, pleurisy or effusion suspected EXAM: CT ANGIOGRAPHY CHEST WITH CONTRAST TECHNIQUE: Multidetector CT imaging of the chest was performed using the standard protocol during bolus administration of intravenous contrast. Multiplanar CT image reconstructions and MIPs were obtained to evaluate the vascular anatomy. RADIATION DOSE REDUCTION: This exam was performed according to the departmental dose-optimization program which includes automated exposure control, adjustment of the mA and/or kV according to patient size and/or use of iterative reconstruction technique. CONTRAST:  36m OMNIPAQUE IOHEXOL 350 MG/ML SOLN COMPARISON:  CT dated March 10, 2021 FINDINGS: Cardiovascular: Evaluation of the bases is limited secondary to respiratory motion. No acute pulmonary embolism through the segmental pulmonary arteries. Heart is at the upper limits of normal. Scattered soft and calcified atherosclerotic plaque throughout the course of the nonaneurysmal thoracic aorta. No pericardial effusion. Mediastinum/Nodes: No axillary adenopathy. Visualized thyroid is unremarkable. Increase in size of multiple mediastinal lymph nodes. Representative pretracheal lymph node measures d 13 mm in the short axis, previously 6 mm (series 6, image 79). Additional representative pretracheal lymph node measures 15 mm in the short axis, previously 10 mm (series 6, image 108). RIGHT hilar lymph node measures 14 mm in the short axis (series 6, image 141). Lungs/Pleura: Trace RIGHT pleural effusion. There is bronchial wall thickening. Multifocal endobronchial debris is  noted. Hypoenhancing centrilobular consolidative opacities most predominately within the bases. Scattered ground-glass opacities are noted in the apices. There is a likely background of mosaic attenuation, likely reflecting air trapping. Several consolidative opacities are more nodular in appearance with representative consolidative opacity versus nodule measuring 6 mm (series 6, image 170). Upper Abdomen: Small hiatal hernia. Mildly nodular contour of the liver with hypertrophy of the LEFT liver could reflect underlying cirrhosis Musculoskeletal: Degenerative changes of the thoracic spine. Review of the MIP images confirms the above findings. IMPRESSION: 1. No acute pulmonary embolism. 2. There is bronchial wall thickening with endobronchial debris and multifocal hypoenhancing consolidative opacities. Findings likely reflect infection versus aspiration. Given nodular appearance of several opacities, recommend follow-up CT in 3 months to assess  for appropriate resolution. 3. Interval increase in size of multiple mediastinal lymph nodes. This is likely reactive in etiology. Recommend attention on CT. 4. Trace RIGHT pleural effusion. 5. Morphologic changes of the liver which could reflect underlying cirrhosis. Aortic Atherosclerosis (ICD10-I70.0). Electronically Signed   By: Valentino Saxon M.D.   On: 10/05/2021 15:36   CT Head Wo Contrast  Result Date: 10/04/2021 CLINICAL DATA:  Mental status change, unknown cause unresponsive at home - recently started on A/C - evaluate for ICH EXAM: CT HEAD WITHOUT CONTRAST TECHNIQUE: Contiguous axial images were obtained from the base of the skull through the vertex without intravenous contrast. RADIATION DOSE REDUCTION: This exam was performed according to the departmental dose-optimization program which includes automated exposure control, adjustment of the mA and/or kV according to patient size and/or use of iterative reconstruction technique. COMPARISON:  MRI head  12/28/2017 report without imaging, CT head 07/25/2020 BRAIN: BRAIN No evidence of large-territorial acute infarction. No parenchymal hemorrhage. No mass lesion. No extra-axial collection. No mass effect or midline shift. No hydrocephalus. Basilar cisterns are patent. Vascular: No hyperdense vessel. Skull: No acute fracture or focal lesion. Limited evaluation for skull base fracture due to motion artifact. The Sinuses/Orbits: Paranasal sinuses and mastoid air cells are clear. Bilateral lens replacement. The orbits are unremarkable. Other: None. IMPRESSION: No acute intracranial abnormality. Electronically Signed   By: Iven Finn M.D.   On: 10/04/2021 17:17   DG Chest Portable 1 View  Result Date: 10/04/2021 CLINICAL DATA:  Altered mental status.  Possible aspiration. EXAM: PORTABLE CHEST 1 VIEW COMPARISON:  09/02/2021 and prior radiographs FINDINGS: UPPER limits normal heart size and peribronchial thickening again noted. There is no evidence of focal airspace disease, pulmonary edema, suspicious pulmonary nodule/mass, pleural effusion, or pneumothorax. No acute bony abnormalities are identified. IMPRESSION: 1. No evidence of acute cardiopulmonary disease. 2. Chronic peribronchial thickening. Electronically Signed   By: Margarette Canada M.D.   On: 10/04/2021 16:26    Assessment & Plan:   Problem List Items Addressed This Visit     Accidental overdose    Polypharmacy has contributed.  She  presented to the ER after being found unresponsive in her bed at her apartment by [place] with suspected accidental opiate overdose.  She called ambulance.  Patient is on oxycodone 10 mg 3 times daily and Valium 5 mg daily at home per Pain clinic.  She received Narcan injection with good response.  Oxycodone is prescribed and recently switched from [place] because Opana was unavailable.  Accidentally Jackelyn Poling  might have taken extra doses.  She had more pain in the right shoulder and saw Simeon Craft on 09/30/2021 at the pain  clinic.  She was given tizanidine.  She was also taking trazodone at night. At the hospital she was taking oxycodone 5 mg 3 times a day as needed, Valium 2.5 mg daily as needed.  She was discharged home on the same regimen.  Discontinued tizanidine and trazodone. She continues to take gabapentin at the same dose. She is reporting more anxiety.  Apparently she failed a pill test at the hospital.  Her family is planning to place her to [facility] respite care for a month.  I will need to fill out [place] 2 form. FL2 form was filled out.  I do not think we need the TB test because she just had a chest x-ray and a chest CT.      Chronic narcotic dependence ([place])     She  presented to the  ER after being found unresponsive in her bed at her apartment by [place] with suspected accidental opiate overdose.  She called ambulance.  Patient is on oxycodone 10 mg 3 times daily and Valium 5 mg daily at home per Pain clinic.  She received Narcan injection with good response.  Oxycodone is prescribed and recently switched from [place] because Opana was unavailable.  Accidentally Jackelyn Poling  might have taken extra doses.  She had more pain in the right shoulder and saw Simeon Craft on 09/30/2021 at the pain clinic.  She was given tizanidine.  She was also taking trazodone at night. At the hospital she was taking oxycodone 5 mg 3 times a day as needed, Valium 2.5 mg daily as needed.  She was discharged home on the same regimen.  Discontinued tizanidine and trazodone. She continues to take gabapentin at the same dose. She is reporting more anxiety.  Apparently she failed a pill test at the hospital.  Her family is planning to place her to [facility] respite care for a month.  I will need to fill out [place] 2 form. FL2 form was filled out.  I do not think we need the TB test because she just had a chest x-ray and a chest CT.      Chronic neck and back pain    10/2021 She  presented to the ER after being found unresponsive in her bed  at her apartment by [place] with suspected accidental opiate overdose.  She called ambulance.  Patient is on oxycodone 10 mg 3 times daily and Valium 5 mg daily at home per Pain clinic.  She received Narcan injection with good response.  Oxycodone is prescribed and recently switched from [place] because Opana was unavailable.  Accidentally Jackelyn Poling  might have taken extra doses.  She had more pain in the right shoulder and saw Simeon Craft on 09/30/2021 at the pain clinic.  She was given tizanidine.  She was also taking trazodone at night. At the hospital she was taking oxycodone 5 mg 3 times a day as needed, Valium 2.5 mg daily as needed.  She was discharged home on the same regimen.  Discontinued tizanidine and trazodone. She continues to take gabapentin at the same dose. She is reporting more anxiety.  Apparently she failed a pill test at the hospital.  Her family is planning to place her to [facility] respite care for a month.  I will need to fill out [place] 2 form. FL2 form was filled out.  I do not think we need the TB test because she just had a chest x-ray and a chest CT.      Confusion    10/2021 She  presented to the ER after being found unresponsive in her bed at her apartment by [place] with suspected accidental opiate overdose.  She called ambulance.  Patient is on oxycodone 10 mg 3 times daily and Valium 5 mg daily at home per Pain clinic.  She received Narcan injection with good response.  Oxycodone is prescribed and recently switched from [place] because Opana was unavailable.  Accidentally Jackelyn Poling  might have taken extra doses.  She had more pain in the right shoulder and saw Simeon Craft on 09/30/2021 at the pain clinic.  She was given tizanidine.  She was also taking trazodone at night. At the hospital she was taking oxycodone 5 mg 3 times a day as needed, Valium 2.5 mg daily as needed.  She was discharged home on the same regimen.  Discontinued tizanidine and trazodone. She continues to take gabapentin  at the same dose. She is reporting more anxiety.  Apparently she failed a pill test at the hospital.  Her family is planning to place her to [facility] respite care for a month.  I will need to fill out [place] 2 form. FL2 form was filled out.  I do not think we need the TB test because she just had a chest x-ray and a chest CT.         Meds ordered this encounter  Medications   DISCONTD: diazepam (VALIUM) 5 MG tablet    Sig: Take 0.5 tablets (2.5 mg total) by mouth 2 (two) times daily.    Dispense:  30 tablet    Refill:  3    Needs office visit   SUMAtriptan (IMITREX) 100 MG tablet    Sig: May repeat in 2 hours if headache persists or recurs.    Dispense:  12 tablet    Refill:  5   diazepam (VALIUM) 5 MG tablet    Sig: Take 2.5 mg bid. Ok to take 2.5 mg in mid-day if needed.    Dispense:  30 tablet    Refill:  3    Needs office visit      Follow-up: Return in about 4 weeks (around 11/06/2021) for a follow-up visit.  [name], MD

## 2021-10-11 LAB — QUANTIFERON-TB GOLD PLUS: QuantiFERON-TB Gold Plus: NEGATIVE

## 2021-10-11 LAB — QUANTIFERON-TB GOLD PLUS (RQFGPL)
QuantiFERON Mitogen Value: >10 [IU]/mL
QuantiFERON Nil Value: 0.01 [IU]/mL
QuantiFERON TB1 Ag Value: 0 [IU]/mL
QuantiFERON TB2 Ag Value: 0.32 [IU]/mL

## 2021-10-14 NOTE — Telephone Encounter (Signed)
x

## 2021-10-15 ENCOUNTER — Telehealth: Payer: Self-pay | Admitting: Internal Medicine

## 2021-10-15 NOTE — Telephone Encounter (Signed)
Pt called in to report that she is doing ok and she is looking forward to seeing him on 11/05/21 and get refills at that time.

## 2021-10-20 ENCOUNTER — Ambulatory Visit: Payer: Medicare Other | Admitting: Internal Medicine

## 2021-10-24 ENCOUNTER — Other Ambulatory Visit: Payer: Self-pay | Admitting: Internal Medicine

## 2021-10-28 ENCOUNTER — Other Ambulatory Visit: Payer: Self-pay

## 2021-10-28 ENCOUNTER — Other Ambulatory Visit: Payer: Self-pay | Admitting: Internal Medicine

## 2021-10-28 ENCOUNTER — Telehealth: Payer: Self-pay | Admitting: *Deleted

## 2021-10-28 NOTE — Telephone Encounter (Signed)
Rec'd fax pt need PA on Paroxetine 40 mg take BID. Submitted w/ Key: [id number]. PA sent to plan waiting on response../l,mb

## 2021-10-28 NOTE — Telephone Encounter (Signed)
Rec'd msg The original prescription was discontinued on 10/08/2021 by Dorcas Carrow, MD for the following reason: Stop Taking at Discharge. Pls advise on refill../l,mb

## 2021-10-29 NOTE — Telephone Encounter (Signed)
Had to call # on fax spoke w/ Legrand Como gave pt information. He states its with [employer] [place] and we would have to talk to them @ [phone] spoke w/ rep Caryl Pina. Took information for PA she states the plan does over 45 tabs in 30 day but pt is wanting #180. Faxed pt last 2 ov [phone]. She states will call back w/ insurance determination.Marland KitchenJohny Chess

## 2021-10-30 NOTE — Telephone Encounter (Signed)
Rec'd determination fax [employer] has been APPROVED. Effective 10/29/21 - 10/30/22. Faxing approval to pof.Marland KitchenJohny Chess

## 2021-11-05 ENCOUNTER — Ambulatory Visit (INDEPENDENT_AMBULATORY_CARE_PROVIDER_SITE_OTHER): Payer: Medicare Other | Admitting: Internal Medicine

## 2021-11-05 ENCOUNTER — Encounter: Payer: Self-pay | Admitting: Internal Medicine

## 2021-11-05 DIAGNOSIS — M542 Cervicalgia: Secondary | ICD-10-CM | POA: Diagnosis not present

## 2021-11-05 DIAGNOSIS — T50901A Poisoning by unspecified drugs, medicaments and biological substances, accidental (unintentional), initial encounter: Secondary | ICD-10-CM

## 2021-11-05 DIAGNOSIS — G8929 Other chronic pain: Secondary | ICD-10-CM

## 2021-11-05 DIAGNOSIS — F1120 Opioid dependence, uncomplicated: Secondary | ICD-10-CM | POA: Diagnosis not present

## 2021-11-05 DIAGNOSIS — N183 Chronic kidney disease, stage 3 unspecified: Secondary | ICD-10-CM

## 2021-11-05 DIAGNOSIS — R410 Disorientation, unspecified: Secondary | ICD-10-CM

## 2021-11-05 DIAGNOSIS — J189 Pneumonia, unspecified organism: Secondary | ICD-10-CM

## 2021-11-05 DIAGNOSIS — M549 Dorsalgia, unspecified: Secondary | ICD-10-CM

## 2021-11-05 DIAGNOSIS — I10 Essential (primary) hypertension: Secondary | ICD-10-CM

## 2021-11-05 MED ORDER — TRAZODONE HCL 150 MG PO TABS: 150.0000 mg | ORAL_TABLET | Freq: Every evening | ORAL | 5 refills | Status: DC | PRN

## 2021-11-05 MED ORDER — ALBUTEROL SULFATE HFA 108 (90 BASE) MCG/ACT IN AERS: INHALATION_SPRAY | RESPIRATORY_TRACT | 5 refills | Status: DC

## 2021-11-05 MED ORDER — IRBESARTAN 150 MG PO TABS
150.0000 mg | ORAL_TABLET | Freq: Every day | ORAL | 5 refills | Status: DC
Start: 2021-11-05 — End: 2022-05-27

## 2021-11-05 MED ORDER — METOPROLOL TARTRATE 50 MG PO TABS
50.0000 mg | ORAL_TABLET | Freq: Two times a day (BID) | ORAL | 5 refills | Status: DC
Start: 2021-11-05 — End: 2022-06-08

## 2021-11-05 MED ORDER — DIAZEPAM 5 MG PO TABS
ORAL_TABLET | ORAL | 3 refills | Status: DC
Start: 2021-11-05 — End: 2022-05-06

## 2021-11-05 MED ORDER — ROSUVASTATIN CALCIUM 5 MG PO TABS: 5.0000 mg | ORAL_TABLET | Freq: Every day | ORAL | 5 refills | Status: DC

## 2021-11-05 MED ORDER — PAROXETINE HCL 40 MG PO TABS
40.0000 mg | ORAL_TABLET | Freq: Two times a day (BID) | ORAL | 5 refills | Status: DC
Start: 2021-11-05 — End: 2022-03-11

## 2021-11-05 MED ORDER — MIRABEGRON ER 50 MG PO TB24: 50.0000 mg | ORAL_TABLET | Freq: Every morning | ORAL | 5 refills | Status: DC

## 2021-11-05 MED ORDER — PANTOPRAZOLE SODIUM 40 MG PO TBEC
40.0000 mg | DELAYED_RELEASE_TABLET | Freq: Two times a day (BID) | ORAL | 5 refills | Status: DC
Start: 2021-11-05 — End: 2022-05-28

## 2021-11-05 MED ORDER — NALOXONE HCL 4 MG/0.1ML NA LIQD: NASAL | 2 refills | Status: DC

## 2021-11-05 MED ORDER — POTASSIUM CHLORIDE ER 10 MEQ PO TBCR
10.0000 meq | EXTENDED_RELEASE_TABLET | Freq: Every day | ORAL | 5 refills | Status: DC
Start: 2021-11-05 — End: 2022-05-12

## 2021-11-05 MED ORDER — APIXABAN 5 MG PO TABS
5.0000 mg | ORAL_TABLET | Freq: Two times a day (BID) | ORAL | 5 refills | Status: DC
Start: 2021-11-05 — End: 2022-05-12

## 2021-11-05 MED ORDER — SUMATRIPTAN SUCCINATE 100 MG PO TABS: ORAL_TABLET | ORAL | 5 refills | Status: DC

## 2021-11-05 MED ORDER — OXYCODONE HCL 5 MG PO TABS: 5.0000 mg | ORAL_TABLET | Freq: Four times a day (QID) | ORAL | 0 refills | Status: DC | PRN

## 2021-11-05 NOTE — Progress Notes (Signed)
Subjective:  Patient ID: Bianca Haley, female    DOB: 12-07-1950  Age: 71 y.o. MRN: [id number]  CC: No chief complaint on file.   HPI Bianca Haley presents for recent confusion with suspected accidental opiate overdose - no relapse Bianca Haley is asking me to do her pain management. Seeing her psychologist q 1 week now.  Outpatient Medications Prior to Visit  Medication Sig Dispense Refill   acetaminophen (TYLENOL) 650 MG CR tablet Take 1,300 mg by mouth every 8 (eight) hours as needed for pain.     Ascorbic Acid (VITAMIN C PO) Take 1 tablet by mouth every morning.     benzonatate (TESSALON) 100 MG capsule Take 1 capsule (100 mg total) by mouth 3 (three) times daily as needed for cough. 20 capsule 0   Calcium Carb-Cholecalciferol (CALCIUM PLUS VITAMIN D3) 600-500 MG-UNIT CAPS Take 2 tablets by mouth every morning.     Cholecalciferol (VITAMIN D) 2000 units tablet Take 2,000 Units by mouth at bedtime.      cycloSPORINE (RESTASIS) 0.05 % ophthalmic emulsion Place 1 drop into both eyes every morning.     diltiazem (CARDIZEM CD) 240 MG 24 hr capsule Take 1 capsule (240 mg total) by mouth every morning. 60 capsule 1   fluticasone (FLONASE) 50 MCG/ACT nasal spray Place 2 sprays into both nostrils daily as needed for allergies or rhinitis.     Multiple Vitamin (MULTIVITAMIN WITH MINERALS) TABS tablet Take 1 tablet by mouth 2 (two) times daily.     OVER THE COUNTER MEDICATION Take 2 capsules by mouth in the morning. [place]     Probiotic CAPS Take 1 capsule by mouth every morning.     vitamin B-12 (CYANOCOBALAMIN) 1000 MCG tablet Take 1,000 mcg by mouth every morning.     albuterol (VENTOLIN HFA) 108 (90 Base) MCG/ACT inhaler INHALE 2 PUFFS INTO THE LUNGS EVERY 4 HOURS AS NEEDED FOR WHEEZING OR SHORTNESS OF BREATH (Patient taking differently: Inhale 2 puffs into the lungs every 4 (four) hours as needed for wheezing or shortness of breath.) 18 g 2   apixaban (ELIQUIS) 5 MG TABS tablet Take 1  tablet (5 mg total) by mouth 2 (two) times daily. 60 tablet 2   diazepam (VALIUM) 5 MG tablet Take 2.5 mg bid. Ok to take 2.5 mg in mid-day if needed. 30 tablet 3   guaiFENesin (MUCINEX) 600 MG 12 hr tablet Take 1 tablet (600 mg total) by mouth 2 (two) times daily. (Patient taking differently: Take 600 mg by mouth 2 (two) times daily as needed for cough or to loosen phlegm.) 60 tablet 2   irbesartan (AVAPRO) 150 MG tablet Take 1 tablet (150 mg total) by mouth daily. 30 tablet 2   Melatonin 10 MG SUBL Place 10 mg under the tongue at bedtime.     metoprolol tartrate (LOPRESSOR) 50 MG tablet Take 1 tablet (50 mg total) by mouth 2 (two) times daily. 60 tablet 2   mirabegron ER (MYRBETRIQ) 50 MG TB24 tablet Take 50 mg by mouth every morning.     Oxycodone HCl 10 MG TABS Take 0.5 tablets (5 mg total) by mouth 3 (three) times daily. 5 tablet 0   pantoprazole (PROTONIX) 40 MG tablet TAKE 1 TABLET(40 MG) BY MOUTH TWICE DAILY BEFORE A MEAL (Patient taking differently: 40 mg 2 (two) times daily before a meal.) 180 tablet 3   PARoxetine (PAXIL) 40 MG tablet TAKE 1 TABLET(40 MG) BY MOUTH TWICE DAILY (Patient taking differently: Take 40  mg by mouth 2 (two) times daily.) 180 tablet 3   Polyethyl Glycol-Propyl Glycol (SYSTANE) 0.4-0.3 % GEL ophthalmic gel Place 1 application. into both eyes at bedtime.     potassium chloride (KLOR-CON) 10 MEQ tablet TAKE 1 TABLET BY MOUTH DAILY 90 tablet 1   rosuvastatin (CRESTOR) 5 MG tablet TAKE 1 TABLET(5 MG) BY MOUTH DAILY (Patient taking differently: Take 5 mg by mouth every morning.) 90 tablet 3   SUMAtriptan (IMITREX) 100 MG tablet May repeat in 2 hours if headache persists or recurs. 12 tablet 5   traZODone (DESYREL) 150 MG tablet TAKE 1 TABLET BY MOUTH AT BEDTIME AS NEEDED FOR SLEEP 30 tablet 11   gabapentin (NEURONTIN) 300 MG capsule Take 4 capsules (1,200 mg total) by mouth at bedtime. 120 capsule 0   No facility-administered medications prior to visit.     ROS: Review of Systems  Constitutional:  Positive for fatigue. Negative for activity change, appetite change, chills and unexpected weight change.  HENT:  Negative for congestion, mouth sores and sinus pressure.   Eyes:  Negative for visual disturbance.  Respiratory:  Negative for cough and chest tightness.   Gastrointestinal:  Negative for abdominal pain and nausea.  Genitourinary:  Negative for difficulty urinating, frequency and vaginal pain.  Musculoskeletal:  Positive for back pain and gait problem.  Skin:  Negative for pallor and rash.  Neurological:  Negative for dizziness, tremors, weakness, numbness and headaches.  Psychiatric/Behavioral:  Negative for confusion, sleep disturbance and suicidal ideas. The patient is not nervous/anxious.     Objective:  BP 110/70 (BP Location: Left Arm, Patient Position: Sitting, Cuff Size: Normal)   Pulse 64   Temp 98.7 F (37.1 C) (Oral)   Ht '5\' 5"'$  (1.651 m)   Wt 135 lb (61.2 kg)   SpO2 90%   BMI 22.47 kg/m   BP Readings from Last 3 Encounters:  11/05/21 110/70  10/09/21 140/82  10/08/21 (!) 192/89    Wt Readings from Last 3 Encounters:  11/05/21 135 lb (61.2 kg)  10/09/21 133 lb (60.3 kg)  10/05/21 141 lb 1.5 oz (64 kg)    Physical Exam Constitutional:      Appearance: Normal appearance.  Musculoskeletal:        General: Tenderness present.  Neurological:     Mental Status: She is oriented to person, place, and time.     Gait: Gait abnormal.  Antalgic gait    A total time of 45 minutes was spent preparing to see the patient, reviewing tests, x-rays, operative reports and other medical records.  Also, obtaining history and performing comprehensive physical exam.  Additionally, counseling the patient regarding the above listed issues.   Finally, documenting clinical information in the health records, coordination of care, educating the patient re: pain management w/opioids.   Lab Results  Component Value Date   WBC  5.4 10/08/2021   HGB 14.5 10/08/2021   HCT 42.3 10/08/2021   PLT 256 10/08/2021   GLUCOSE 127 (H) 10/08/2021   CHOL 155 04/03/2020   TRIG 102.0 04/03/2020   HDL 61.40 04/03/2020   LDLCALC 73 04/03/2020   ALT 14 10/08/2021   AST 22 10/08/2021   NA 137 10/08/2021   K 3.2 (L) 10/08/2021   CL 92 (L) 10/08/2021   CREATININE 0.72 10/08/2021   BUN 10 10/08/2021   CO2 33 (H) 10/08/2021   TSH 1.43 09/18/2021   INR 1.0 09/21/2018   HGBA1C 6.1 06/23/2018    CT Angio Chest Pulmonary Embolism (  PE) W or WO Contrast  Result Date: 10/05/2021 CLINICAL DATA:  Chest pain or SOB, pleurisy or effusion suspected EXAM: CT ANGIOGRAPHY CHEST WITH CONTRAST TECHNIQUE: Multidetector CT imaging of the chest was performed using the standard protocol during bolus administration of intravenous contrast. Multiplanar CT image reconstructions and MIPs were obtained to evaluate the vascular anatomy. RADIATION DOSE REDUCTION: This exam was performed according to the departmental dose-optimization program which includes automated exposure control, adjustment of the mA and/or kV according to patient size and/or use of iterative reconstruction technique. CONTRAST:  74m OMNIPAQUE IOHEXOL 350 MG/ML SOLN COMPARISON:  CT dated March 10, 2021 FINDINGS: Cardiovascular: Evaluation of the bases is limited secondary to respiratory motion. No acute pulmonary embolism through the segmental pulmonary arteries. Heart is at the upper limits of normal. Scattered soft and calcified atherosclerotic plaque throughout the course of the nonaneurysmal thoracic aorta. No pericardial effusion. Mediastinum/Nodes: No axillary adenopathy. Visualized thyroid is unremarkable. Increase in size of multiple mediastinal lymph nodes. Representative pretracheal lymph node measures d 13 mm in the short axis, previously 6 mm (series 6, image 79). Additional representative pretracheal lymph node measures 15 mm in the short axis, previously 10 mm (series 6, image  108). RIGHT hilar lymph node measures 14 mm in the short axis (series 6, image 141). Lungs/Pleura: Trace RIGHT pleural effusion. There is bronchial wall thickening. Multifocal endobronchial debris is noted. Hypoenhancing centrilobular consolidative opacities most predominately within the bases. Scattered ground-glass opacities are noted in the apices. There is a likely background of mosaic attenuation, likely reflecting air trapping. Several consolidative opacities are more nodular in appearance with representative consolidative opacity versus nodule measuring 6 mm (series 6, image 170). Upper Abdomen: Small hiatal hernia. Mildly nodular contour of the liver with hypertrophy of the LEFT liver could reflect underlying cirrhosis Musculoskeletal: Degenerative changes of the thoracic spine. Review of the MIP images confirms the above findings. IMPRESSION: 1. No acute pulmonary embolism. 2. There is bronchial wall thickening with endobronchial debris and multifocal hypoenhancing consolidative opacities. Findings likely reflect infection versus aspiration. Given nodular appearance of several opacities, recommend follow-up CT in 3 months to assess for appropriate resolution. 3. Interval increase in size of multiple mediastinal lymph nodes. This is likely reactive in etiology. Recommend attention on CT. 4. Trace RIGHT pleural effusion. 5. Morphologic changes of the liver which could reflect underlying cirrhosis. Aortic Atherosclerosis (ICD10-I70.0). Electronically Signed   By: SValentino SaxonM.D.   On: 10/05/2021 15:36   CT Head Wo Contrast  Result Date: 10/04/2021 CLINICAL DATA:  Mental status change, unknown cause unresponsive at home - recently started on A/C - evaluate for ICH EXAM: CT HEAD WITHOUT CONTRAST TECHNIQUE: Contiguous axial images were obtained from the base of the skull through the vertex without intravenous contrast. RADIATION DOSE REDUCTION: This exam was performed according to the departmental  dose-optimization program which includes automated exposure control, adjustment of the mA and/or kV according to patient size and/or use of iterative reconstruction technique. COMPARISON:  MRI head 12/28/2017 report without imaging, CT head 07/25/2020 BRAIN: BRAIN No evidence of large-territorial acute infarction. No parenchymal hemorrhage. No mass lesion. No extra-axial collection. No mass effect or midline shift. No hydrocephalus. Basilar cisterns are patent. Vascular: No hyperdense vessel. Skull: No acute fracture or focal lesion. Limited evaluation for skull base fracture due to motion artifact. The Sinuses/Orbits: Paranasal sinuses and mastoid air cells are clear. Bilateral lens replacement. The orbits are unremarkable. Other: None. IMPRESSION: No acute intracranial abnormality. Electronically Signed   By: MThomasena Edis  Mckinley Jewel M.D.   On: 10/04/2021 17:17   DG Chest Portable 1 View  Result Date: 10/04/2021 CLINICAL DATA:  Altered mental status.  Possible aspiration. EXAM: PORTABLE CHEST 1 VIEW COMPARISON:  09/02/2021 and prior radiographs FINDINGS: UPPER limits normal heart size and peribronchial thickening again noted. There is no evidence of focal airspace disease, pulmonary edema, suspicious pulmonary nodule/mass, pleural effusion, or pneumothorax. No acute bony abnormalities are identified. IMPRESSION: 1. No evidence of acute cardiopulmonary disease. 2. Chronic peribronchial thickening. Electronically Signed   By: Margarette Canada M.D.   On: 10/04/2021 16:26    Assessment & Plan:   Problem List Items Addressed This Visit     Accidental overdose    No relapse Narcane Rx D/c Gabapentin      Chronic narcotic dependence ([place])    Bianca Haley is asking me to take over her pain management. Cont on Oxy 5 mg tid, Diazepam 2.5 mg bid Pain Contract: we have a mutual understanding that I will not increase the Oxy dose in the future. Seeing her psychologist q 1 week now. RTC 2 mo      Chronic neck and back pain     Bianca Haley is asking me to take over her pain management. Cont on Oxy 5 mg tid, Diazepam 2.5 mg bid Pain Contract: we have a mutual understanding that I will not increase the Oxy dose in the future. Seeing her psychologist q 1 week now. RTC 2 mo      Relevant Medications   oxyCODONE (ROXICODONE) 5 MG immediate release tablet   PARoxetine (PAXIL) 40 MG tablet   traZODone (DESYREL) 150 MG tablet   Confusion    No relapse D/c Gabapentin      CRI (chronic renal insufficiency), stage 3 (moderate) (HCC)    Monitoring GFR      Essential hypertension    Stable. Cont w/Atenolol, Diltiazem,  Maxzide      Relevant Medications   apixaban (ELIQUIS) 5 MG TABS tablet   irbesartan (AVAPRO) 150 MG tablet   metoprolol tartrate (LOPRESSOR) 50 MG tablet   rosuvastatin (CRESTOR) 5 MG tablet   Other Visit Diagnoses     Community acquired pneumonia, unspecified laterality       Relevant Medications   albuterol (VENTOLIN HFA) 108 (90 Base) MCG/ACT inhaler         Meds ordered this encounter  Medications   oxyCODONE (ROXICODONE) 5 MG immediate release tablet    Sig: Take 1 tablet (5 mg total) by mouth every 6 (six) hours as needed for severe pain.    Dispense:  90 tablet    Refill:  0    Please fill on or after 11/07/21   diazepam (VALIUM) 5 MG tablet    Sig: Take 2.5 mg bid. Ok to take 2.5 mg in mid-day if needed.    Dispense:  30 tablet    Refill:  3    Needs office visit   albuterol (VENTOLIN HFA) 108 (90 Base) MCG/ACT inhaler    Sig: INHALE 2 PUFFS INTO THE LUNGS EVERY 4 HOURS AS NEEDED FOR WHEEZING OR SHORTNESS OF BREATH    Dispense:  18 g    Refill:  5   apixaban (ELIQUIS) 5 MG TABS tablet    Sig: Take 1 tablet (5 mg total) by mouth 2 (two) times daily.    Dispense:  60 tablet    Refill:  5   irbesartan (AVAPRO) 150 MG tablet    Sig: Take 1 tablet (150 mg total) by  mouth daily.    Dispense:  30 tablet    Refill:  5   metoprolol tartrate (LOPRESSOR) 50 MG tablet    Sig:  Take 1 tablet (50 mg total) by mouth 2 (two) times daily.    Dispense:  60 tablet    Refill:  5   mirabegron ER (MYRBETRIQ) 50 MG TB24 tablet    Sig: Take 1 tablet (50 mg total) by mouth every morning.    Dispense:  30 tablet    Refill:  5   pantoprazole (PROTONIX) 40 MG tablet    Sig: Take 1 tablet (40 mg total) by mouth 2 (two) times daily. TAKE 1 TABLET(40 MG) BY MOUTH TWICE DAILY BEFORE A MEAL Strength: 40 mg    Dispense:  60 tablet    Refill:  5   PARoxetine (PAXIL) 40 MG tablet    Sig: Take 1 tablet (40 mg total) by mouth 2 (two) times daily.    Dispense:  30 tablet    Refill:  5   potassium chloride (KLOR-CON) 10 MEQ tablet    Sig: Take 1 tablet (10 mEq total) by mouth daily.    Dispense:  30 tablet    Refill:  5   rosuvastatin (CRESTOR) 5 MG tablet    Sig: Take 1 tablet (5 mg total) by mouth daily. TAKE 1 TABLET(5 MG) BY MOUTH DAILY Strength: 5 mg    Dispense:  30 tablet    Refill:  5   SUMAtriptan (IMITREX) 100 MG tablet    Sig: May repeat in 2 hours if headache persists or recurs.    Dispense:  12 tablet    Refill:  5   traZODone (DESYREL) 150 MG tablet    Sig: Take 1 tablet (150 mg total) by mouth at bedtime as needed. for sleep    Dispense:  30 tablet    Refill:  5   naloxone (NARCAN) nasal spray 4 mg/0.1 mL    Sig: 1 actuation in one nostril once. May repeat in 2-3 min    Dispense:  1 each    Refill:  2      Follow-up: Return in about 4 weeks (around 12/03/2021) for a follow-up visit.  [name], MD

## 2021-11-05 NOTE — Assessment & Plan Note (Signed)
No relapse Narcane Rx D/c Gabapentin

## 2021-11-05 NOTE — Assessment & Plan Note (Signed)
Bianca Haley is asking me to take over her pain management. Cont on Oxy 5 mg tid, Diazepam 2.5 mg bid Pain Contract: we have a mutual understanding that I will not increase the Oxy dose in the future. Seeing her psychologist q 1 week now. RTC 2 mo

## 2021-11-05 NOTE — Assessment & Plan Note (Signed)
Stable. Cont w/Atenolol, Diltiazem,  Maxzide 

## 2021-11-05 NOTE — Assessment & Plan Note (Signed)
No relapse D/c Gabapentin

## 2021-11-05 NOTE — Assessment & Plan Note (Signed)
Monitoring GFR

## 2021-11-05 NOTE — Assessment & Plan Note (Addendum)
Bianca Haley is asking me to take over her pain management. Cont on Oxy 5 mg tid, Diazepam 2.5 mg bid Pain Contract: we have a mutual understanding that I will not increase the Oxy dose in the future. Seeing her psychologist q 1 week now. RTC 2 mo

## 2021-11-07 ENCOUNTER — Telehealth: Payer: Self-pay | Admitting: Internal Medicine

## 2021-11-07 MED ORDER — OXYCODONE HCL 10 MG PO TABS: 5.0000 mg | ORAL_TABLET | Freq: Three times a day (TID) | ORAL | 0 refills | Status: DC | PRN

## 2021-11-07 NOTE — Telephone Encounter (Signed)
Okay.  Thanks.

## 2021-11-07 NOTE — Telephone Encounter (Signed)
Pt is requesting oxyCODONE (ROXICODONE) 5 MG immediate release tablet but she wants '10MG'$  instead. She stated her pharmacy called her and advised her that they don't carry '5MG'$ , they only carry '10MG'$ . Pt stated she has been getting 10 MG and cutting the pills in half. She would like to have the rx updated to '10MG'$  and sent to the pharmacy. She stated she will continue cutting pills in half.    Please advise.   CB: [id number]  [place], [place] Phone:  [phone]  Fax:  [phone]

## 2021-11-12 ENCOUNTER — Telehealth: Payer: Self-pay | Admitting: Pulmonary Disease

## 2021-11-12 DIAGNOSIS — G4734 Idiopathic sleep related nonobstructive alveolar hypoventilation: Secondary | ICD-10-CM

## 2021-11-12 NOTE — Telephone Encounter (Signed)
Called patient and told her that Dr Elsworth Soho approved the POC device to rent for her travel trip. Advised patient that she needs to make follow up visit with Dr Elsworth Soho since she was seen last year 2022. Patient states she will call office when she returns from trip to schedule office visit. Order placed for RENTAL POC. Nothing further needed

## 2021-11-12 NOTE — Telephone Encounter (Signed)
Called patient and she states that she is needing a prescription for a portable POC machine to travel.   Sir are you ok with [place] witting a order for a POC for patient to travel.   Please advise sir

## 2021-12-04 ENCOUNTER — Encounter: Payer: Self-pay | Admitting: Internal Medicine

## 2021-12-04 ENCOUNTER — Ambulatory Visit (INDEPENDENT_AMBULATORY_CARE_PROVIDER_SITE_OTHER): Payer: Medicare Other | Admitting: Internal Medicine

## 2021-12-04 DIAGNOSIS — N183 Chronic kidney disease, stage 3 unspecified: Secondary | ICD-10-CM

## 2021-12-04 DIAGNOSIS — G8929 Other chronic pain: Secondary | ICD-10-CM

## 2021-12-04 DIAGNOSIS — I10 Essential (primary) hypertension: Secondary | ICD-10-CM

## 2021-12-04 DIAGNOSIS — M549 Dorsalgia, unspecified: Secondary | ICD-10-CM | POA: Diagnosis not present

## 2021-12-04 DIAGNOSIS — I480 Paroxysmal atrial fibrillation: Secondary | ICD-10-CM | POA: Diagnosis not present

## 2021-12-04 DIAGNOSIS — F419 Anxiety disorder, unspecified: Secondary | ICD-10-CM

## 2021-12-04 DIAGNOSIS — M542 Cervicalgia: Secondary | ICD-10-CM

## 2021-12-04 DIAGNOSIS — F32A Depression, unspecified: Secondary | ICD-10-CM

## 2021-12-04 MED ORDER — OXYCODONE HCL 10 MG PO TABS: 10.0000 mg | ORAL_TABLET | Freq: Four times a day (QID) | ORAL | 0 refills | Status: DC | PRN

## 2021-12-04 MED ORDER — DILTIAZEM HCL ER COATED BEADS 240 MG PO CP24: 240.0000 mg | ORAL_CAPSULE | Freq: Every morning | ORAL | 3 refills | Status: DC

## 2021-12-04 MED ORDER — OXYCODONE HCL 10 MG PO TABS: 5.0000 mg | ORAL_TABLET | Freq: Three times a day (TID) | ORAL | 0 refills | Status: DC | PRN

## 2021-12-04 NOTE — Assessment & Plan Note (Addendum)
Cont w/good hydration Monitor GFR 

## 2021-12-04 NOTE — Assessment & Plan Note (Signed)
Cont on Eliquis- 

## 2021-12-04 NOTE — Progress Notes (Signed)
Subjective:  Patient ID: Bianca Haley, female    DOB: 06/30/1950  Age: 71 y.o. MRN: [id number]  CC: No chief complaint on file.   HPI Bianca Haley presents for chronic pain, anxiety, HAs, CRI  Outpatient Medications Prior to Visit  Medication Sig Dispense Refill   acetaminophen (TYLENOL) 650 MG CR tablet Take 1,300 mg by mouth every 8 (eight) hours as needed for pain.     albuterol (VENTOLIN HFA) 108 (90 Base) MCG/ACT inhaler INHALE 2 PUFFS INTO THE LUNGS EVERY 4 HOURS AS NEEDED FOR WHEEZING OR SHORTNESS OF BREATH 18 g 5   apixaban (ELIQUIS) 5 MG TABS tablet Take 1 tablet (5 mg total) by mouth 2 (two) times daily. 60 tablet 5   Ascorbic Acid (VITAMIN C PO) Take 1 tablet by mouth every morning.     benzonatate (TESSALON) 100 MG capsule Take 1 capsule (100 mg total) by mouth 3 (three) times daily as needed for cough. 20 capsule 0   Calcium Carb-Cholecalciferol (CALCIUM PLUS VITAMIN D3) 600-500 MG-UNIT CAPS Take 2 tablets by mouth every morning.     Cholecalciferol (VITAMIN D) 2000 units tablet Take 2,000 Units by mouth at bedtime.      cycloSPORINE (RESTASIS) 0.05 % ophthalmic emulsion Place 1 drop into both eyes every morning.     diazepam (VALIUM) 5 MG tablet Take 2.5 mg bid. Ok to take 2.5 mg in mid-day if needed. 30 tablet 3   fluticasone (FLONASE) 50 MCG/ACT nasal spray Place 2 sprays into both nostrils daily as needed for allergies or rhinitis.     irbesartan (AVAPRO) 150 MG tablet Take 1 tablet (150 mg total) by mouth daily. 30 tablet 5   metoprolol tartrate (LOPRESSOR) 50 MG tablet Take 1 tablet (50 mg total) by mouth 2 (two) times daily. 60 tablet 5   mirabegron ER (MYRBETRIQ) 50 MG TB24 tablet Take 1 tablet (50 mg total) by mouth every morning. 30 tablet 5   Multiple Vitamin (MULTIVITAMIN WITH MINERALS) TABS tablet Take 1 tablet by mouth 2 (two) times daily.     naloxone (NARCAN) nasal spray 4 mg/0.1 mL 1 actuation in one nostril once. May repeat in 2-3 min 1 each 2   OVER  THE COUNTER MEDICATION Take 2 capsules by mouth in the morning. [place]     pantoprazole (PROTONIX) 40 MG tablet Take 1 tablet (40 mg total) by mouth 2 (two) times daily. TAKE 1 TABLET(40 MG) BY MOUTH TWICE DAILY BEFORE A MEAL Strength: 40 mg 60 tablet 5   PARoxetine (PAXIL) 40 MG tablet Take 1 tablet (40 mg total) by mouth 2 (two) times daily. 30 tablet 5   potassium chloride (KLOR-CON) 10 MEQ tablet Take 1 tablet (10 mEq total) by mouth daily. 30 tablet 5   Probiotic CAPS Take 1 capsule by mouth every morning.     rosuvastatin (CRESTOR) 5 MG tablet Take 1 tablet (5 mg total) by mouth daily. TAKE 1 TABLET(5 MG) BY MOUTH DAILY Strength: 5 mg 30 tablet 5   SUMAtriptan (IMITREX) 100 MG tablet May repeat in 2 hours if headache persists or recurs. 12 tablet 5   traZODone (DESYREL) 150 MG tablet Take 1 tablet (150 mg total) by mouth at bedtime as needed. for sleep 30 tablet 5   vitamin B-12 (CYANOCOBALAMIN) 1000 MCG tablet Take 1,000 mcg by mouth every morning.     diltiazem (CARDIZEM CD) 240 MG 24 hr capsule Take 1 capsule (240 mg total) by mouth every morning. 60 capsule  1   Oxycodone HCl 10 MG TABS Take 0.5 tablets (5 mg total) by mouth 3 (three) times daily as needed. 45 tablet 0   gabapentin (NEURONTIN) 300 MG capsule Take 4 capsules (1,200 mg total) by mouth at bedtime. 120 capsule 0   No facility-administered medications prior to visit.    ROS: Review of Systems  Objective:  BP 130/78 (BP Location: Right Arm, Patient Position: Sitting, Cuff Size: Large)   Pulse 61   Temp 98.6 F (37 C) (Oral)   Ht '5\' 5"'$  (1.651 m)   Wt 136 lb (61.7 kg)   SpO2 90%   BMI 22.63 kg/m   BP Readings from Last 3 Encounters:  12/04/21 130/78  11/05/21 110/70  10/09/21 140/82    Wt Readings from Last 3 Encounters:  12/04/21 136 lb (61.7 kg)  11/05/21 135 lb (61.2 kg)  10/09/21 133 lb (60.3 kg)    Physical Exam  Lab Results  Component Value Date   WBC 5.4 10/08/2021   HGB 14.5 10/08/2021    HCT 42.3 10/08/2021   PLT 256 10/08/2021   GLUCOSE 127 (H) 10/08/2021   CHOL 155 04/03/2020   TRIG 102.0 04/03/2020   HDL 61.40 04/03/2020   LDLCALC 73 04/03/2020   ALT 14 10/08/2021   AST 22 10/08/2021   NA 137 10/08/2021   K 3.2 (L) 10/08/2021   CL 92 (L) 10/08/2021   CREATININE 0.72 10/08/2021   BUN 10 10/08/2021   CO2 33 (H) 10/08/2021   TSH 1.43 09/18/2021   INR 1.0 09/21/2018   HGBA1C 6.1 06/23/2018    CT Angio Chest Pulmonary Embolism (PE) W or WO Contrast  Result Date: 10/05/2021 CLINICAL DATA:  Chest pain or SOB, pleurisy or effusion suspected EXAM: CT ANGIOGRAPHY CHEST WITH CONTRAST TECHNIQUE: Multidetector CT imaging of the chest was performed using the standard protocol during bolus administration of intravenous contrast. Multiplanar CT image reconstructions and MIPs were obtained to evaluate the vascular anatomy. RADIATION DOSE REDUCTION: This exam was performed according to the departmental dose-optimization program which includes automated exposure control, adjustment of the mA and/or kV according to patient size and/or use of iterative reconstruction technique. CONTRAST:  39m OMNIPAQUE IOHEXOL 350 MG/ML SOLN COMPARISON:  CT dated March 10, 2021 FINDINGS: Cardiovascular: Evaluation of the bases is limited secondary to respiratory motion. No acute pulmonary embolism through the segmental pulmonary arteries. Heart is at the upper limits of normal. Scattered soft and calcified atherosclerotic plaque throughout the course of the nonaneurysmal thoracic aorta. No pericardial effusion. Mediastinum/Nodes: No axillary adenopathy. Visualized thyroid is unremarkable. Increase in size of multiple mediastinal lymph nodes. Representative pretracheal lymph node measures d 13 mm in the short axis, previously 6 mm (series 6, image 79). Additional representative pretracheal lymph node measures 15 mm in the short axis, previously 10 mm (series 6, image 108). RIGHT hilar lymph node measures 14 mm  in the short axis (series 6, image 141). Lungs/Pleura: Trace RIGHT pleural effusion. There is bronchial wall thickening. Multifocal endobronchial debris is noted. Hypoenhancing centrilobular consolidative opacities most predominately within the bases. Scattered ground-glass opacities are noted in the apices. There is a likely background of mosaic attenuation, likely reflecting air trapping. Several consolidative opacities are more nodular in appearance with representative consolidative opacity versus nodule measuring 6 mm (series 6, image 170). Upper Abdomen: Small hiatal hernia. Mildly nodular contour of the liver with hypertrophy of the LEFT liver could reflect underlying cirrhosis Musculoskeletal: Degenerative changes of the thoracic spine. Review of the MIP images  confirms the above findings. IMPRESSION: 1. No acute pulmonary embolism. 2. There is bronchial wall thickening with endobronchial debris and multifocal hypoenhancing consolidative opacities. Findings likely reflect infection versus aspiration. Given nodular appearance of several opacities, recommend follow-up CT in 3 months to assess for appropriate resolution. 3. Interval increase in size of multiple mediastinal lymph nodes. This is likely reactive in etiology. Recommend attention on CT. 4. Trace RIGHT pleural effusion. 5. Morphologic changes of the liver which could reflect underlying cirrhosis. Aortic Atherosclerosis (ICD10-I70.0). Electronically Signed   By: Valentino Saxon M.D.   On: 10/05/2021 15:36   CT Head Wo Contrast  Result Date: 10/04/2021 CLINICAL DATA:  Mental status change, unknown cause unresponsive at home - recently started on A/C - evaluate for ICH EXAM: CT HEAD WITHOUT CONTRAST TECHNIQUE: Contiguous axial images were obtained from the base of the skull through the vertex without intravenous contrast. RADIATION DOSE REDUCTION: This exam was performed according to the departmental dose-optimization program which includes  automated exposure control, adjustment of the mA and/or kV according to patient size and/or use of iterative reconstruction technique. COMPARISON:  MRI head 12/28/2017 report without imaging, CT head 07/25/2020 BRAIN: BRAIN No evidence of large-territorial acute infarction. No parenchymal hemorrhage. No mass lesion. No extra-axial collection. No mass effect or midline shift. No hydrocephalus. Basilar cisterns are patent. Vascular: No hyperdense vessel. Skull: No acute fracture or focal lesion. Limited evaluation for skull base fracture due to motion artifact. The Sinuses/Orbits: Paranasal sinuses and mastoid air cells are clear. Bilateral lens replacement. The orbits are unremarkable. Other: None. IMPRESSION: No acute intracranial abnormality. Electronically Signed   By: Iven Finn M.D.   On: 10/04/2021 17:17   DG Chest Portable 1 View  Result Date: 10/04/2021 CLINICAL DATA:  Altered mental status.  Possible aspiration. EXAM: PORTABLE CHEST 1 VIEW COMPARISON:  09/02/2021 and prior radiographs FINDINGS: UPPER limits normal heart size and peribronchial thickening again noted. There is no evidence of focal airspace disease, pulmonary edema, suspicious pulmonary nodule/mass, pleural effusion, or pneumothorax. No acute bony abnormalities are identified. IMPRESSION: 1. No evidence of acute cardiopulmonary disease. 2. Chronic peribronchial thickening. Electronically Signed   By: Margarette Canada M.D.   On: 10/04/2021 16:26    Assessment & Plan:   Problem List Items Addressed This Visit     Anxiety and depression    Cont w/Paxil      Chronic neck and back pain    Cont on Oxy 5 mg tid, Diazepam 2.5 mg bid Pain Contract: we have a mutual understanding that I will not increase the Oxy dose in the future. Seeing her psychologist q 1 week now. RTC 2 mo      Relevant Medications   Oxycodone HCl 10 MG TABS   Oxycodone HCl 10 MG TABS   CRI (chronic renal insufficiency), stage 3 (moderate) (HCC)    Cont  w/good hydration Monitor GFR      Essential hypertension     Cont w/Atenolol, Diltiazem,  Maxzide      Relevant Medications   diltiazem (CARDIZEM CD) 240 MG 24 hr capsule   Paroxysmal atrial fibrillation (HCC)    Cont on Eliquis      Relevant Medications   diltiazem (CARDIZEM CD) 240 MG 24 hr capsule      Meds ordered this encounter  Medications   diltiazem (CARDIZEM CD) 240 MG 24 hr capsule    Sig: Take 1 capsule (240 mg total) by mouth every morning.    Dispense:  90 capsule  Refill:  3   Oxycodone HCl 10 MG TABS    Sig: Take 0.5 tablets (5 mg total) by mouth 3 (three) times daily as needed.    Dispense:  45 tablet    Refill:  0    Please fill on or after 12/07/21   Oxycodone HCl 10 MG TABS    Sig: Take 1 tablet (10 mg total) by mouth every 6 (six) hours as needed.    Dispense:  45 tablet    Refill:  0    Please fill on or after 01/06/22      Follow-up: Return in about 3 months (around 03/06/2022) for a follow-up visit.  [name], MD

## 2021-12-04 NOTE — Assessment & Plan Note (Signed)
Cont w/Paxil

## 2021-12-04 NOTE — Assessment & Plan Note (Signed)
Cont w/Atenolol, Diltiazem,  Maxzide

## 2021-12-04 NOTE — Assessment & Plan Note (Signed)
Cont on Oxy 5 mg tid, Diazepam 2.5 mg bid Pain Contract: we have a mutual understanding that I will not increase the Oxy dose in the future. Seeing her psychologist q 1 week now. RTC 2 mo 

## 2021-12-16 ENCOUNTER — Telehealth: Payer: Self-pay | Admitting: Internal Medicine

## 2021-12-16 NOTE — Telephone Encounter (Signed)
Bianca Haley is requesting an alternative for oxyCODONE (ROXICODONE) 5 MG immediate release tablet. Pt said she's called every [facility] in [place] and they said rx is on back order and unsure when rx will come in. She said she is completely out of current rx.    Please advise

## 2021-12-17 ENCOUNTER — Other Ambulatory Visit: Payer: Self-pay | Admitting: Internal Medicine

## 2021-12-17 MED ORDER — OXYCODONE-ACETAMINOPHEN 5-325 MG PO TABS: 1.0000 | ORAL_TABLET | Freq: Three times a day (TID) | ORAL | 0 refills | Status: DC | PRN

## 2021-12-17 NOTE — Telephone Encounter (Signed)
She has a prescription for 10 mg oxycodone tablets (not 5 mg) at the pharmacy.  Do they have 10 mg tablets?  Thanks

## 2021-12-17 NOTE — Telephone Encounter (Signed)
Percocet Rx was [facility]

## 2021-12-17 NOTE — Telephone Encounter (Signed)
No, they do not have the 5 or 10's.  She would like something else called in.

## 2021-12-29 NOTE — Progress Notes (Signed)
HPI: Follow-up atrial fibrillation.  Previously found to be in atrial fibrillation May 2023 in the setting of upper respiratory infection.  Troponin also noted to be elevated.  She underwent cardiac catheterization that revealed no coronary disease and normal LV function.  Echocardiogram showed normal LV function, grade 1 diastolic dysfunction, mild left atrial enlargement, moderate mitral regurgitation, mild to moderate tricuspid regurgitation, moderate aortic insufficiency.  Patient converted spontaneously to sinus rhythm.  Note patient's free T4 was elevated at time of hospitalization as well; FU normal.  Since last seen patient denies dyspnea, chest pain, palpitations or syncope.  No bleeding.  Current Outpatient Medications  Medication Sig Dispense Refill   acetaminophen (TYLENOL) 650 MG CR tablet Take 1,300 mg by mouth every 8 (eight) hours as needed for pain.     albuterol (VENTOLIN HFA) 108 (90 Base) MCG/ACT inhaler INHALE 2 PUFFS INTO THE LUNGS EVERY 4 HOURS AS NEEDED FOR WHEEZING OR SHORTNESS OF BREATH 18 g 5   apixaban (ELIQUIS) 5 MG TABS tablet Take 1 tablet (5 mg total) by mouth 2 (two) times daily. 60 tablet 5   Ascorbic Acid (VITAMIN C PO) Take 1 tablet by mouth every morning.     benzonatate (TESSALON) 100 MG capsule Take 1 capsule (100 mg total) by mouth 3 (three) times daily as needed for cough. 20 capsule 0   Calcium Carb-Cholecalciferol (CALCIUM PLUS VITAMIN D3) 600-500 MG-UNIT CAPS Take 2 tablets by mouth every morning.     Cholecalciferol (VITAMIN D) 2000 units tablet Take 2,000 Units by mouth at bedtime.      cycloSPORINE (RESTASIS) 0.05 % ophthalmic emulsion Place 1 drop into both eyes every morning.     diazepam (VALIUM) 5 MG tablet Take 2.5 mg bid. Ok to take 2.5 mg in mid-day if needed. 30 tablet 3   diltiazem (CARDIZEM CD) 240 MG 24 hr capsule Take 1 capsule (240 mg total) by mouth every morning. 90 capsule 3   fluticasone (FLONASE) 50 MCG/ACT nasal spray Place 2  sprays into both nostrils daily as needed for allergies or rhinitis.     irbesartan (AVAPRO) 150 MG tablet Take 1 tablet (150 mg total) by mouth daily. 30 tablet 5   metoprolol tartrate (LOPRESSOR) 50 MG tablet Take 1 tablet (50 mg total) by mouth 2 (two) times daily. 60 tablet 5   mirabegron ER (MYRBETRIQ) 50 MG TB24 tablet Take 1 tablet (50 mg total) by mouth every morning. 30 tablet 5   Multiple Vitamin (MULTIVITAMIN WITH MINERALS) TABS tablet Take 1 tablet by mouth 2 (two) times daily.     naloxone (NARCAN) nasal spray 4 mg/0.1 mL 1 actuation in one nostril once. May repeat in 2-3 min 1 each 2   OVER THE COUNTER MEDICATION Take 2 capsules by mouth in the morning. [place]     Oxycodone HCl 10 MG TABS Take 0.5 tablets (5 mg total) by mouth 3 (three) times daily as needed. 45 tablet 0   Oxycodone HCl 10 MG TABS Take 1 tablet (10 mg total) by mouth every 6 (six) hours as needed. 45 tablet 0   oxyCODONE-acetaminophen (PERCOCET/ROXICET) 5-325 MG tablet Take 1 tablet by mouth every 8 (eight) hours as needed for severe pain. 90 tablet 0   pantoprazole (PROTONIX) 40 MG tablet Take 1 tablet (40 mg total) by mouth 2 (two) times daily. TAKE 1 TABLET(40 MG) BY MOUTH TWICE DAILY BEFORE A MEAL Strength: 40 mg 60 tablet 5   PARoxetine (PAXIL) 40 MG tablet  Take 1 tablet (40 mg total) by mouth 2 (two) times daily. 30 tablet 5   potassium chloride (KLOR-CON) 10 MEQ tablet Take 1 tablet (10 mEq total) by mouth daily. 30 tablet 5   Probiotic CAPS Take 1 capsule by mouth every morning.     rosuvastatin (CRESTOR) 5 MG tablet Take 1 tablet (5 mg total) by mouth daily. TAKE 1 TABLET(5 MG) BY MOUTH DAILY Strength: 5 mg 30 tablet 5   SUMAtriptan (IMITREX) 100 MG tablet May repeat in 2 hours if headache persists or recurs. 12 tablet 5   traZODone (DESYREL) 150 MG tablet Take 1 tablet (150 mg total) by mouth at bedtime as needed. for sleep 30 tablet 5   vitamin B-12 (CYANOCOBALAMIN) 1000 MCG tablet Take 1,000 mcg by  mouth every morning.     gabapentin (NEURONTIN) 300 MG capsule Take 4 capsules (1,200 mg total) by mouth at bedtime. 120 capsule 0   No current facility-administered medications for this visit.     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   Hemorrhoids    History of colon polyps    History of endometriosis    and fibroids   History of hiatal hernia    History of UTI    Hypertension    Memory loss    Migraine    OSA (obstructive sleep apnea)    on nocturnal O2   Recurrent pneumonia 07/2018   Seasonal allergies    Vitamin D deficiency    Wears glasses     Past Surgical History:  Procedure Laterality Date   ABDOMINAL HYSTERECTOMY     BACK SURGERY     BIOPSY  10/20/2018   Procedure: BIOPSY;  Surgeon: [name], MD;  Location: [facility];  Service: Endoscopy;;   BLADDER SUSPENSION     COLONOSCOPY  02/16/2008   normal   DENTAL SURGERY     ESOPHAGOGASTRODUODENOSCOPY (EGD) WITH PROPOFOL N/A 10/20/2018   Procedure: ESOPHAGOGASTRODUODENOSCOPY (EGD) WITH PROPOFOL;  Surgeon: [name], MD;  Location: [facility];  Service: Endoscopy;  Laterality: N/A;   EUS N/A 10/20/2018   Procedure: UPPER ENDOSCOPIC ULTRASOUND (EUS) RADIAL;  Surgeon: [name], MD;  Location: [facility];  Service: Endoscopy;  Laterality: N/A;   FOOT SURGERY Left    x3   laparoscopy     for evaluation of endometriosis   LEFT HEART CATH AND CORONARY ANGIOGRAPHY N/A 09/04/2021   Procedure: LEFT HEART CATH AND CORONARY ANGIOGRAPHY;  Surgeon: Jettie Booze, MD;  Location: [facility];  Service: Cardiovascular;  Laterality: N/A;   TOTAL KNEE ARTHROPLASTY Left 07/15/2015    Procedure: TOTAL LEFT KNEE ARTHROPLASTY;  Surgeon: Paralee Cancel, MD;  Location: [facility];  Service: Orthopedics;  Laterality: Left;   UPPER GI ENDOSCOPY      Social History   Socioeconomic History   Marital status: Widowed    Spouse name: Not on file   Number of children: 2   Years of education: 57   Highest education level: Not on file  Occupational History  Occupation: Futures trader - retired  Tobacco Use   Smoking status: Never   Smokeless tobacco: Never  Vaping Use   Vaping Use: Never used  Substance and Sexual Activity   Alcohol use: No    Alcohol/week: 0.0 standard drinks of alcohol    Comment: As of 09-Aug-2018, it is been several years since she drank any alcohol.   Drug use: No   Sexual activity: Yes    Partners: Male    Birth control/protection: Surgical  Other Topics Concern   Not on file  Social History Narrative   HSG, [employer] BS interior design. Married 08/08/77 - 82 years, her husband died in 08/08/97.  She has been with her current, second husband since around 08/08/08.  1 dtr - August 09, 1982 - lawyer, 1 son 08/09/87. No h/o abuse.   Social Determinants of Health   Financial Resource Strain: Low Risk  (02/07/2021)   Overall Financial Resource Strain (CARDIA)    Difficulty of Paying Living Expenses: Not hard at all  Food Insecurity: Not on file  Transportation Needs: Not on file  Physical Activity: Not on file  Stress: Not on file  Social Connections: Not on file  Intimate Partner Violence: Not on file    Family History  Problem Relation Age of Onset   Emphysema Mother    Heart disease Mother 17       MI    ROS: no fevers or chills, productive cough, hemoptysis, dysphasia, odynophagia, melena, hematochezia, dysuria, hematuria, rash, seizure activity, orthopnea, PND, pedal edema, claudication. Remaining systems are negative.  Physical Exam: Well-developed well-nourished in no acute distress.  Skin is warm and dry.  HEENT is normal.  Neck is supple.  Chest is clear to  auscultation with normal expansion.  Cardiovascular exam is regular rate and rhythm.  Abdominal exam nontender or distended. No masses palpated. Extremities show no edema. neuro grossly intact  A/P  1 paroxysmal atrial fibrillation-patient remains in sinus rhythm.  Continue Cardizem and metoprolol for rate control if atrial fibrillation recurs.  Continue apixaban.  2 valvular heart disease-previous echocardiogram showed moderate aortic insufficiency, moderate mitral regurgitation.  We will plan follow-up echocardiogram May 2024.  3 elevated free T4-noted during recent hospitalization.  Follow-up free T4 normal.  Per primary care.  4 hypertension-patient's blood pressure is elevated; however she follows this closely at home and it is typically controlled.  Continue present medications and follow-up.  5 hyperlipidemia-continue statin.  Kirk Ruths, MD

## 2022-01-06 ENCOUNTER — Ambulatory Visit: Payer: Medicare Other | Admitting: Internal Medicine

## 2022-01-09 ENCOUNTER — Telehealth: Payer: Self-pay | Admitting: Adult Health

## 2022-01-09 ENCOUNTER — Ambulatory Visit: Payer: Medicare Other | Attending: Cardiology | Admitting: Cardiology

## 2022-01-09 ENCOUNTER — Encounter: Payer: Self-pay | Admitting: Cardiology

## 2022-01-09 VITALS — BP 166/88 | HR 61 | Ht 65.5 in | Wt 138.8 lb

## 2022-01-09 DIAGNOSIS — I10 Essential (primary) hypertension: Secondary | ICD-10-CM

## 2022-01-09 DIAGNOSIS — I340 Nonrheumatic mitral (valve) insufficiency: Secondary | ICD-10-CM | POA: Diagnosis not present

## 2022-01-09 DIAGNOSIS — I480 Paroxysmal atrial fibrillation: Secondary | ICD-10-CM | POA: Insufficient documentation

## 2022-01-09 DIAGNOSIS — I351 Nonrheumatic aortic (valve) insufficiency: Secondary | ICD-10-CM | POA: Diagnosis not present

## 2022-01-09 NOTE — Patient Instructions (Signed)
  Follow-Up: At [facility], you and your health needs are our priority.  As part of our continuing mission to provide you with exceptional heart care, we have created designated Provider Care Teams.  These Care Teams include your primary Cardiologist (physician) and Advanced Practice Providers (APPs -  Physician Assistants and Nurse Practitioners) who all work together to provide you with the care you need, when you need it.  We recommend signing up for the patient portal called "MyChart".  Sign up information is provided on this After Visit Summary.  MyChart is used to connect with patients for Virtual Visits (Telemedicine).  Patients are able to view lab/test results, encounter notes, upcoming appointments, etc.  Non-urgent messages can be sent to your provider as well.   To learn more about what you can do with MyChart, go to [email or web address].    Your next appointment:   6 month(s)  The format for your next appointment:   In Person  Provider:   Brian Crenshaw, MD    

## 2022-01-09 NOTE — Telephone Encounter (Signed)
[facility] is able to see our notes in Epic. We do not need to send copies of office notes. Nothing further needed

## 2022-01-14 ENCOUNTER — Telehealth: Payer: Self-pay | Admitting: Pulmonary Disease

## 2022-01-14 DIAGNOSIS — G4734 Idiopathic sleep related nonobstructive alveolar hypoventilation: Secondary | ICD-10-CM

## 2022-01-14 NOTE — Telephone Encounter (Signed)
Pt called in on 7/12 and stated order should be sent to [facility] and it was faxed to them.  Tried to call pt just now and vm is full - need to clarify who she wants order to go to.  I spoke to dtr [place] and asked her to have pt to call me.

## 2022-01-14 NOTE — Telephone Encounter (Signed)
Patient is calling again (see encounter from 9/8) states she needs o/v notes/clinical notes & a poc order/ script to sent to [facility] as she is leaving out of town 9/23.  Information can be faxed to [facility] at [phone].

## 2022-01-14 NOTE — Telephone Encounter (Signed)
Spoke to pt and she states in July she did want to try [facility] even thought she had been with [facility] but since she had been with [facility] so long they couldn't help her.  She would like order sent to [facility] now.  Previous order was from July so new order will be needed & she is going out of town next weekend and needed before then.

## 2022-01-14 NOTE — Telephone Encounter (Signed)
Order has been placed.

## 2022-01-19 ENCOUNTER — Telehealth: Payer: Self-pay | Admitting: Internal Medicine

## 2022-01-19 NOTE — Telephone Encounter (Signed)
Patient is asking for early refill of  Oxycodone HCl 10 MG TABS- states she is going out of town next week and will run out of medication while she is gone. Call back number is [place], [place]

## 2022-01-20 NOTE — Telephone Encounter (Signed)
Bianca Haley a can work with her pharmacy on it . [place] and pharmacists normally discourage requests of early opioid refills.  Thanks

## 2022-01-21 NOTE — Telephone Encounter (Signed)
Patient stopped by to talk to the nurse but the nurse said she was unavailable to talk. Patient said that the medication was on back order so that the oxyCODONE-acetaminophen (PERCOCET/ROXICET) 5-325 MG tablet was sent in for 90 tablets on 12/17/21 and she takes 3 a day. Directions say take 1 tablet every 8 hours as needed for pain. This had lasted the patient a month because of that and she is now out. She said 01/24/22 and needs before she goes. Please advise

## 2022-01-23 MED ORDER — OXYCODONE-ACETAMINOPHEN 5-325 MG PO TABS: 1.0000 | ORAL_TABLET | Freq: Three times a day (TID) | ORAL | 0 refills | Status: DC | PRN

## 2022-01-23 NOTE — Telephone Encounter (Signed)
Patient aware.

## 2022-01-23 NOTE — Telephone Encounter (Signed)
Okay.  Renewed today.  Thanks

## 2022-01-29 ENCOUNTER — Telehealth: Payer: Self-pay

## 2022-01-29 NOTE — Telephone Encounter (Signed)
Bianca Haley Is calling in from [facility] through [facility] in regards to the medication, OXYCODONE. Patient has been flagged through insurance for over use of opioids and are needing some additional clinical information to her case.

## 2022-01-30 ENCOUNTER — Telehealth: Payer: Self-pay | Admitting: Pulmonary Disease

## 2022-01-30 DIAGNOSIS — G4734 Idiopathic sleep related nonobstructive alveolar hypoventilation: Secondary | ICD-10-CM

## 2022-01-30 NOTE — Telephone Encounter (Signed)
It has been addressed with the patient and her family after her most recent hospitalization.  It is under control.  She is using much less opioids than before.  Thank you

## 2022-02-03 DIAGNOSIS — Z0289 Encounter for other administrative examinations: Secondary | ICD-10-CM

## 2022-02-04 NOTE — Telephone Encounter (Signed)
Bianca Haley is calling in stating she has not heard back, gave message below and said she does need additional clinical information and asked for a call back.

## 2022-02-05 NOTE — Telephone Encounter (Signed)
Called Gretchen back again still no answer has to [facility] RTC.Marland KitchenJohny Chess

## 2022-02-05 NOTE — Telephone Encounter (Signed)
Called pt no answer LMOM RTC.../lmb 

## 2022-02-06 LAB — HM DIABETES EYE EXAM

## 2022-02-06 NOTE — Telephone Encounter (Signed)
I have placed a new order with the new directions.

## 2022-02-06 NOTE — Telephone Encounter (Signed)
Gretchen call back. She is needing fax # to fax over form to MD on pt oxycodone. Inform MD is out today will sign and return when he return on Monday.Marland KitchenJohny Chess

## 2022-02-09 NOTE — Telephone Encounter (Signed)
Faxed form back to gretchen.Marland KitchenJohny Chess

## 2022-02-09 NOTE — Telephone Encounter (Signed)
Form filled out and signed Thx

## 2022-02-09 NOTE — Telephone Encounter (Signed)
Noted  

## 2022-02-10 ENCOUNTER — Encounter: Payer: Self-pay | Admitting: Internal Medicine

## 2022-02-23 ENCOUNTER — Telehealth: Payer: Self-pay

## 2022-02-23 MED ORDER — OXYCODONE-ACETAMINOPHEN 5-325 MG PO TABS: 1.0000 | ORAL_TABLET | Freq: Three times a day (TID) | ORAL | 0 refills | Status: DC | PRN

## 2022-02-23 NOTE — Telephone Encounter (Signed)
MEDICATION: oxyCODONE-acetaminophen (PERCOCET/ROXICET) 5-325 MG tablet   PHARMACY: [id number] - [place], [place] - [address] AT [address]  Comments: Has a couple of days left.   **Let patient know to contact pharmacy at the end of the day to make sure medication is ready. **  ** Please notify patient to allow 48-72 hours to process**  **Encourage patient to contact the pharmacy for refills or they can request refills through [facility]**

## 2022-02-23 NOTE — Addendum Note (Signed)
Addended by: [name] on: 02/23/2022 11:31 PM   Modules accepted: Orders

## 2022-02-23 NOTE — Telephone Encounter (Signed)
Prescription for Percocet was renewed.  Bianca Haley can make her decision about COVID-19 vaccine on the basis of her own research and opinion, pressure or advice from friends and family.  I personally would not take it.  Thanks

## 2022-02-23 NOTE — Telephone Encounter (Signed)
Patient is calling in stating that she received a covid vaccine January of 2023, wants to know if she needs to get another one.

## 2022-02-24 NOTE — Telephone Encounter (Signed)
Notified pt w/MD response.../lmb 

## 2022-03-11 ENCOUNTER — Ambulatory Visit (INDEPENDENT_AMBULATORY_CARE_PROVIDER_SITE_OTHER): Payer: Medicare Other | Admitting: Internal Medicine

## 2022-03-11 ENCOUNTER — Encounter: Payer: Self-pay | Admitting: Internal Medicine

## 2022-03-11 VITALS — BP 136/72 | HR 55 | Temp 98.9°F | Ht 65.5 in | Wt 140.2 lb

## 2022-03-11 DIAGNOSIS — F419 Anxiety disorder, unspecified: Secondary | ICD-10-CM

## 2022-03-11 DIAGNOSIS — N183 Chronic kidney disease, stage 3 unspecified: Secondary | ICD-10-CM

## 2022-03-11 DIAGNOSIS — Z23 Encounter for immunization: Secondary | ICD-10-CM | POA: Diagnosis not present

## 2022-03-11 DIAGNOSIS — F1120 Opioid dependence, uncomplicated: Secondary | ICD-10-CM | POA: Diagnosis not present

## 2022-03-11 DIAGNOSIS — M542 Cervicalgia: Secondary | ICD-10-CM | POA: Diagnosis not present

## 2022-03-11 DIAGNOSIS — G8929 Other chronic pain: Secondary | ICD-10-CM

## 2022-03-11 DIAGNOSIS — M549 Dorsalgia, unspecified: Secondary | ICD-10-CM

## 2022-03-11 LAB — COMPREHENSIVE METABOLIC PANEL
ALT: 9 U/L (ref 0–35)
AST: 20 U/L (ref 0–37)
Albumin: 4.4 g/dL (ref 3.5–5.2)
Alkaline Phosphatase: 99 U/L (ref 39–117)
BUN: 21 mg/dL (ref 6–23)
CO2: 36 mEq/L — ABNORMAL HIGH (ref 19–32)
Calcium: 9.8 mg/dL (ref 8.4–10.5)
Chloride: 100 mEq/L (ref 96–112)
Creatinine, Ser: 0.8 mg/dL (ref 0.40–1.20)
GFR: 73.02 mL/min (ref >60.00)
Glucose, Bld: 90 mg/dL (ref 70–99)
Potassium: 4.3 mEq/L (ref 3.5–5.1)
Sodium: 140 mEq/L (ref 135–145)
Total Bilirubin: 0.4 mg/dL (ref 0.2–1.2)
Total Protein: 7.3 g/dL (ref 6.0–8.3)

## 2022-03-11 MED ORDER — OXYCODONE-ACETAMINOPHEN 5-325 MG PO TABS: 1.0000 | ORAL_TABLET | Freq: Three times a day (TID) | ORAL | 0 refills | Status: DC | PRN

## 2022-03-11 MED ORDER — PAROXETINE HCL 40 MG PO TABS: 40.0000 mg | ORAL_TABLET | Freq: Two times a day (BID) | ORAL | 5 refills | Status: DC

## 2022-03-11 NOTE — Assessment & Plan Note (Signed)
Cont on Oxy 5 mg tid, Diazepam 2.5 mg bid Pain Contract: we have a mutual understanding that I will not increase the Oxy dose in the future. Seeing her psychologist q 1 week now. RTC 2 mo

## 2022-03-11 NOTE — Progress Notes (Signed)
Subjective:  Patient ID: Bianca Haley, female    DOB: Sep 11, 1950  Age: 71 y.o. MRN: [id number]  CC: Follow-up (3 month f/u- Flu shot) and Medication Refill (Need refill on paroxetine )   HPI Bianca Haley presents for LBP, cervical pain, anxiety On Paxil 40 mg BID for many years  Outpatient Medications Prior to Visit  Medication Sig Dispense Refill   acetaminophen (TYLENOL) 650 MG CR tablet Take 1,300 mg by mouth every 8 (eight) hours as needed for pain.     albuterol (VENTOLIN HFA) 108 (90 Base) MCG/ACT inhaler INHALE 2 PUFFS INTO THE LUNGS EVERY 4 HOURS AS NEEDED FOR WHEEZING OR SHORTNESS OF BREATH 18 g 5   apixaban (ELIQUIS) 5 MG TABS tablet Take 1 tablet (5 mg total) by mouth 2 (two) times daily. 60 tablet 5   Ascorbic Acid (VITAMIN C PO) Take 1 tablet by mouth every morning.     benzonatate (TESSALON) 100 MG capsule Take 1 capsule (100 mg total) by mouth 3 (three) times daily as needed for cough. 20 capsule 0   Calcium Carb-Cholecalciferol (CALCIUM PLUS VITAMIN D3) 600-500 MG-UNIT CAPS Take 2 tablets by mouth every morning.     Cholecalciferol (VITAMIN D) 2000 units tablet Take 2,000 Units by mouth at bedtime.      cycloSPORINE (RESTASIS) 0.05 % ophthalmic emulsion Place 1 drop into both eyes every morning.     diazepam (VALIUM) 5 MG tablet Take 2.5 mg bid. Ok to take 2.5 mg in mid-day if needed. 30 tablet 3   diltiazem (CARDIZEM CD) 240 MG 24 hr capsule Take 1 capsule (240 mg total) by mouth every morning. 90 capsule 3   fluticasone (FLONASE) 50 MCG/ACT nasal spray Place 2 sprays into both nostrils daily as needed for allergies or rhinitis.     irbesartan (AVAPRO) 150 MG tablet Take 1 tablet (150 mg total) by mouth daily. 30 tablet 5   metoprolol tartrate (LOPRESSOR) 50 MG tablet Take 1 tablet (50 mg total) by mouth 2 (two) times daily. 60 tablet 5   mirabegron ER (MYRBETRIQ) 50 MG TB24 tablet Take 1 tablet (50 mg total) by mouth every morning. 30 tablet 5   Multiple Vitamin  (MULTIVITAMIN WITH MINERALS) TABS tablet Take 1 tablet by mouth 2 (two) times daily.     naloxone (NARCAN) nasal spray 4 mg/0.1 mL 1 actuation in one nostril once. May repeat in 2-3 min 1 each 2   OVER THE COUNTER MEDICATION Take 2 capsules by mouth in the morning. [place]     pantoprazole (PROTONIX) 40 MG tablet Take 1 tablet (40 mg total) by mouth 2 (two) times daily. TAKE 1 TABLET(40 MG) BY MOUTH TWICE DAILY BEFORE A MEAL Strength: 40 mg 60 tablet 5   potassium chloride (KLOR-CON) 10 MEQ tablet Take 1 tablet (10 mEq total) by mouth daily. 30 tablet 5   Probiotic CAPS Take 1 capsule by mouth every morning.     rosuvastatin (CRESTOR) 5 MG tablet Take 1 tablet (5 mg total) by mouth daily. TAKE 1 TABLET(5 MG) BY MOUTH DAILY Strength: 5 mg 30 tablet 5   SUMAtriptan (IMITREX) 100 MG tablet May repeat in 2 hours if headache persists or recurs. 12 tablet 5   traZODone (DESYREL) 150 MG tablet Take 1 tablet (150 mg total) by mouth at bedtime as needed. for sleep 30 tablet 5   vitamin B-12 (CYANOCOBALAMIN) 1000 MCG tablet Take 1,000 mcg by mouth every morning.     Oxycodone HCl 10  MG TABS Take 0.5 tablets (5 mg total) by mouth 3 (three) times daily as needed. 45 tablet 0   Oxycodone HCl 10 MG TABS Take 1 tablet (10 mg total) by mouth every 6 (six) hours as needed. 45 tablet 0   oxyCODONE-acetaminophen (PERCOCET/ROXICET) 5-325 MG tablet Take 1 tablet by mouth every 8 (eight) hours as needed for severe pain. 90 tablet 0   PARoxetine (PAXIL) 40 MG tablet Take 1 tablet (40 mg total) by mouth 2 (two) times daily. 30 tablet 5   gabapentin (NEURONTIN) 300 MG capsule Take 4 capsules (1,200 mg total) by mouth at bedtime. 120 capsule 0   No facility-administered medications prior to visit.    ROS: Review of Systems  Constitutional:  Negative for activity change, appetite change, chills, fatigue and unexpected weight change.  HENT:  Negative for congestion, mouth sores and sinus pressure.   Eyes:  Negative  for visual disturbance.  Respiratory:  Negative for cough and chest tightness.   Gastrointestinal:  Negative for abdominal pain and nausea.  Genitourinary:  Negative for difficulty urinating, frequency and vaginal pain.  Musculoskeletal:  Positive for arthralgias and back pain. Negative for gait problem.  Skin:  Negative for pallor and rash.  Neurological:  Negative for dizziness, tremors, weakness, numbness and headaches.  Psychiatric/Behavioral:  Negative for confusion and sleep disturbance.     Objective:  BP 136/72 (BP Location: Left Arm)   Pulse (!) 55   Temp 98.9 F (37.2 C) (Oral)   Ht 5' 5.5" (1.664 m)   Wt 140 lb 3.2 oz (63.6 kg)   SpO2 96%   BMI 22.98 kg/m   BP Readings from Last 3 Encounters:  03/11/22 136/72  01/09/22 (!) 166/88  12/04/21 130/78    Wt Readings from Last 3 Encounters:  03/11/22 140 lb 3.2 oz (63.6 kg)  01/09/22 138 lb 12.8 oz (63 kg)  12/04/21 136 lb (61.7 kg)    Physical Exam Constitutional:      General: She is not in acute distress.    Appearance: She is well-developed.  HENT:     Head: Normocephalic.     Right Ear: External ear normal.     Left Ear: External ear normal.     Nose: Nose normal.  Eyes:     General:        Right eye: No discharge.        Left eye: No discharge.     Conjunctiva/sclera: Conjunctivae normal.     Pupils: Pupils are equal, round, and reactive to light.  Neck:     Thyroid: No thyromegaly.     Vascular: No JVD.     Trachea: No tracheal deviation.  Cardiovascular:     Rate and Rhythm: Normal rate and regular rhythm.     Heart sounds: Normal heart sounds.  Pulmonary:     Effort: No respiratory distress.     Breath sounds: No stridor. No wheezing.  Abdominal:     General: Bowel sounds are normal. There is no distension.     Palpations: Abdomen is soft. There is no mass.     Tenderness: There is no abdominal tenderness. There is no guarding or rebound.  Musculoskeletal:        General: Tenderness  present.     Cervical back: Normal range of motion and neck supple. No rigidity.  Lymphadenopathy:     Cervical: No cervical adenopathy.  Skin:    Findings: No erythema or rash.  Neurological:  Mental Status: She is oriented to person, place, and time.     Cranial Nerves: No cranial nerve deficit.     Motor: No abnormal muscle tone.     Coordination: Coordination normal.     Gait: Gait abnormal.     Deep Tendon Reflexes: Reflexes normal.  Psychiatric:        Behavior: Behavior normal.        Thought Content: Thought content normal.        Judgment: Judgment normal.   LS w/pain antalgic  Lab Results  Component Value Date   WBC 5.4 10/08/2021   HGB 14.5 10/08/2021   HCT 42.3 10/08/2021   PLT 256 10/08/2021   GLUCOSE 127 (H) 10/08/2021   CHOL 155 04/03/2020   TRIG 102.0 04/03/2020   HDL 61.40 04/03/2020   LDLCALC 73 04/03/2020   ALT 14 10/08/2021   AST 22 10/08/2021   NA 137 10/08/2021   K 3.2 (L) 10/08/2021   CL 92 (L) 10/08/2021   CREATININE 0.72 10/08/2021   BUN 10 10/08/2021   CO2 33 (H) 10/08/2021   TSH 1.43 09/18/2021   INR 1.0 09/21/2018   HGBA1C 6.1 06/23/2018    CT Angio Chest Pulmonary Embolism (PE) W or WO Contrast  Result Date: 10/05/2021 CLINICAL DATA:  Chest pain or SOB, pleurisy or effusion suspected EXAM: CT ANGIOGRAPHY CHEST WITH CONTRAST TECHNIQUE: Multidetector CT imaging of the chest was performed using the standard protocol during bolus administration of intravenous contrast. Multiplanar CT image reconstructions and MIPs were obtained to evaluate the vascular anatomy. RADIATION DOSE REDUCTION: This exam was performed according to the departmental dose-optimization program which includes automated exposure control, adjustment of the mA and/or kV according to patient size and/or use of iterative reconstruction technique. CONTRAST:  48m OMNIPAQUE IOHEXOL 350 MG/ML SOLN COMPARISON:  CT dated March 10, 2021 FINDINGS: Cardiovascular: Evaluation of the  bases is limited secondary to respiratory motion. No acute pulmonary embolism through the segmental pulmonary arteries. Heart is at the upper limits of normal. Scattered soft and calcified atherosclerotic plaque throughout the course of the nonaneurysmal thoracic aorta. No pericardial effusion. Mediastinum/Nodes: No axillary adenopathy. Visualized thyroid is unremarkable. Increase in size of multiple mediastinal lymph nodes. Representative pretracheal lymph node measures d 13 mm in the short axis, previously 6 mm (series 6, image 79). Additional representative pretracheal lymph node measures 15 mm in the short axis, previously 10 mm (series 6, image 108). RIGHT hilar lymph node measures 14 mm in the short axis (series 6, image 141). Lungs/Pleura: Trace RIGHT pleural effusion. There is bronchial wall thickening. Multifocal endobronchial debris is noted. Hypoenhancing centrilobular consolidative opacities most predominately within the bases. Scattered ground-glass opacities are noted in the apices. There is a likely background of mosaic attenuation, likely reflecting air trapping. Several consolidative opacities are more nodular in appearance with representative consolidative opacity versus nodule measuring 6 mm (series 6, image 170). Upper Abdomen: Small hiatal hernia. Mildly nodular contour of the liver with hypertrophy of the LEFT liver could reflect underlying cirrhosis Musculoskeletal: Degenerative changes of the thoracic spine. Review of the MIP images confirms the above findings. IMPRESSION: 1. No acute pulmonary embolism. 2. There is bronchial wall thickening with endobronchial debris and multifocal hypoenhancing consolidative opacities. Findings likely reflect infection versus aspiration. Given nodular appearance of several opacities, recommend follow-up CT in 3 months to assess for appropriate resolution. 3. Interval increase in size of multiple mediastinal lymph nodes. This is likely reactive in etiology.  Recommend attention on CT. 4.  Trace RIGHT pleural effusion. 5. Morphologic changes of the liver which could reflect underlying cirrhosis. Aortic Atherosclerosis (ICD10-I70.0). Electronically Signed   By: Valentino Saxon M.D.   On: 10/05/2021 15:36   CT Head Wo Contrast  Result Date: 10/04/2021 CLINICAL DATA:  Mental status change, unknown cause unresponsive at home - recently started on A/C - evaluate for ICH EXAM: CT HEAD WITHOUT CONTRAST TECHNIQUE: Contiguous axial images were obtained from the base of the skull through the vertex without intravenous contrast. RADIATION DOSE REDUCTION: This exam was performed according to the departmental dose-optimization program which includes automated exposure control, adjustment of the mA and/or kV according to patient size and/or use of iterative reconstruction technique. COMPARISON:  MRI head 12/28/2017 report without imaging, CT head 07/25/2020 BRAIN: BRAIN No evidence of large-territorial acute infarction. No parenchymal hemorrhage. No mass lesion. No extra-axial collection. No mass effect or midline shift. No hydrocephalus. Basilar cisterns are patent. Vascular: No hyperdense vessel. Skull: No acute fracture or focal lesion. Limited evaluation for skull base fracture due to motion artifact. The Sinuses/Orbits: Paranasal sinuses and mastoid air cells are clear. Bilateral lens replacement. The orbits are unremarkable. Other: None. IMPRESSION: No acute intracranial abnormality. Electronically Signed   By: Iven Finn M.D.   On: 10/04/2021 17:17   DG Chest Portable 1 View  Result Date: 10/04/2021 CLINICAL DATA:  Altered mental status.  Possible aspiration. EXAM: PORTABLE CHEST 1 VIEW COMPARISON:  09/02/2021 and prior radiographs FINDINGS: UPPER limits normal heart size and peribronchial thickening again noted. There is no evidence of focal airspace disease, pulmonary edema, suspicious pulmonary nodule/mass, pleural effusion, or pneumothorax. No acute bony  abnormalities are identified. IMPRESSION: 1. No evidence of acute cardiopulmonary disease. 2. Chronic peribronchial thickening. Electronically Signed   By: Margarette Canada M.D.   On: 10/04/2021 16:26    Assessment & Plan:   Problem List Items Addressed This Visit     CRI (chronic renal insufficiency), stage 3 (moderate) (HCC)    Cont w/good hydration Monitor GFR      Relevant Orders   Comprehensive metabolic panel   Chronic neck and back pain - Primary    Cont on Oxy 5 mg tid, Diazepam 2.5 mg bid Pain Contract: we have a mutual understanding that I will not increase the Oxy dose in the future. Seeing her psychologist q 1 week now. RTC 2 mo      Relevant Medications   PARoxetine (PAXIL) 40 MG tablet   oxyCODONE-acetaminophen (PERCOCET/ROXICET) 5-325 MG tablet   oxyCODONE-acetaminophen (PERCOCET/ROXICET) 5-325 MG tablet   Chronic narcotic dependence (HCC)    Cont on Oxy 5 mg tid, Diazepam 2.5 mg bid Pain Contract: we have a mutual understanding that I will not increase the Oxy dose in the future. Seeing her psychologist q 1 week now. RTC 2 mo      Anxiety disorder    Cont w/Paxil. On Paxil 40 mg BID for many years      Relevant Medications   PARoxetine (PAXIL) 40 MG tablet   Other Visit Diagnoses     Needs flu shot       Relevant Orders   Flu Vaccine QUAD High Dose(Fluad) (Completed)         Meds ordered this encounter  Medications   PARoxetine (PAXIL) 40 MG tablet    Sig: Take 1 tablet (40 mg total) by mouth 2 (two) times daily.    Dispense:  60 tablet    Refill:  5   oxyCODONE-acetaminophen (PERCOCET/ROXICET) 5-325 MG tablet  Sig: Take 1 tablet by mouth every 8 (eight) hours as needed for severe pain.    Dispense:  90 tablet    Refill:  0    Please fill on or after 03/25/22   oxyCODONE-acetaminophen (PERCOCET/ROXICET) 5-325 MG tablet    Sig: Take 1 tablet by mouth every 8 (eight) hours as needed for severe pain.    Dispense:  90 tablet    Refill:  0     Please fill on or after 04/24/22      Follow-up: Return in about 3 months (around 06/11/2022) for a follow-up visit.  [name], MD

## 2022-03-11 NOTE — Assessment & Plan Note (Signed)
Cont w/good hydration Monitor GFR

## 2022-03-11 NOTE — Assessment & Plan Note (Signed)
Cont w/Paxil. On Paxil 40 mg BID for many years

## 2022-03-23 ENCOUNTER — Telehealth: Payer: Self-pay | Admitting: Pulmonary Disease

## 2022-03-23 DIAGNOSIS — G4734 Idiopathic sleep related nonobstructive alveolar hypoventilation: Secondary | ICD-10-CM

## 2022-03-23 NOTE — Telephone Encounter (Signed)
Called [facility] and they state that they are needing a new order for the POC and for it to state that patient needs to be fit tested with them in regards to POC. New order placed. Patient was renting POC last month and dropped it off and now new order is placed to get her a best fit evaluation from [facility]. Nothing further needed

## 2022-03-24 NOTE — Telephone Encounter (Signed)
Patient called to check the status of her POC order.  Patient stated that [facility] said they do not have the order.  Please call patient to give her an update.  She stated she is leaving tomorrow to go out of town and needs the Oxygen.  CB# [phone]

## 2022-03-31 ENCOUNTER — Telehealth: Payer: Self-pay | Admitting: Internal Medicine

## 2022-03-31 ENCOUNTER — Telehealth: Payer: Self-pay | Admitting: Pulmonary Disease

## 2022-03-31 NOTE — Telephone Encounter (Signed)
Patient needs a refill on her oxycodone 5 mg - Please send to [facility] on the [facility], [place]   Next visit:  05/12/2022

## 2022-03-31 NOTE — Telephone Encounter (Signed)
Pt has not been seen at office since 05/2020. Called and spoke with [facility] from [place] about the order that was placed. Stated to her that it would be best for pt to be titrated by [facility] due to it being over a year since pt had been seen at the office and she verbalized understanding. Nothing further needed.

## 2022-04-01 MED ORDER — OXYCODONE-ACETAMINOPHEN 5-325 MG PO TABS: 1.0000 | ORAL_TABLET | Freq: Three times a day (TID) | ORAL | 0 refills | Status: DC | PRN

## 2022-04-01 NOTE — Telephone Encounter (Signed)
The prescription was supposed to be at [facility].  I did re-sent it just in case.  Thanks

## 2022-05-05 ENCOUNTER — Telehealth: Payer: Self-pay | Admitting: Internal Medicine

## 2022-05-05 DIAGNOSIS — M25562 Pain in left knee: Secondary | ICD-10-CM | POA: Diagnosis not present

## 2022-05-05 DIAGNOSIS — M25672 Stiffness of left ankle, not elsewhere classified: Secondary | ICD-10-CM | POA: Diagnosis not present

## 2022-05-05 DIAGNOSIS — M6281 Muscle weakness (generalized): Secondary | ICD-10-CM | POA: Diagnosis not present

## 2022-05-05 DIAGNOSIS — M5459 Other low back pain: Secondary | ICD-10-CM | POA: Diagnosis not present

## 2022-05-05 NOTE — Telephone Encounter (Signed)
Caller & Relationship to patient: PT  Call back number: [phone]  Date of last office visit: 03/11/2022  Date of next office visit: 05/12/2022  Medication(s) to be refilled:  oxyCODONE-acetaminophen (PERCOCET/ROXICET) 5-325 MG tablet   diazepam (VALIUM) 5 MG tablet   Preferred Pharmacy:   [facility], [place], [place] [address]  Pharmacy is not currently listed on preferred pharmacies, however with change of insurance this will be PT's primary pharmacy from now on.

## 2022-05-06 ENCOUNTER — Ambulatory Visit (INDEPENDENT_AMBULATORY_CARE_PROVIDER_SITE_OTHER): Payer: BLUE CROSS/BLUE SHIELD

## 2022-05-06 VITALS — Ht 65.5 in | Wt 142.0 lb

## 2022-05-06 DIAGNOSIS — M810 Age-related osteoporosis without current pathological fracture: Secondary | ICD-10-CM

## 2022-05-06 DIAGNOSIS — Z13820 Encounter for screening for osteoporosis: Secondary | ICD-10-CM

## 2022-05-06 DIAGNOSIS — Z1211 Encounter for screening for malignant neoplasm of colon: Secondary | ICD-10-CM | POA: Diagnosis not present

## 2022-05-06 DIAGNOSIS — Z0000 Encounter for general adult medical examination without abnormal findings: Secondary | ICD-10-CM | POA: Diagnosis not present

## 2022-05-06 MED ORDER — OXYCODONE-ACETAMINOPHEN 5-325 MG PO TABS: 1.0000 | ORAL_TABLET | Freq: Three times a day (TID) | ORAL | 0 refills | Status: DC | PRN

## 2022-05-06 MED ORDER — DIAZEPAM 5 MG PO TABS: ORAL_TABLET | ORAL | 3 refills | Status: DC

## 2022-05-06 MED ORDER — DIAZEPAM 5 MG PO TABS
ORAL_TABLET | ORAL | 3 refills | Status: DC
Start: 2022-05-06 — End: 2022-05-06

## 2022-05-06 NOTE — Progress Notes (Addendum)
Virtual Visit via Telephone Note  I connected with  Bianca Haley on 05/06/22 at  1:00 PM EST by telephone and verified that I am speaking with the correct person using two identifiers.  Location: Patient: Home Provider: [place] Persons participating in the virtual visit: [place]   I discussed the limitations, risks, security and privacy concerns of performing an evaluation and management service by telephone and the availability of in person appointments. The patient expressed understanding and agreed to proceed.  Interactive audio and video telecommunications were attempted between this nurse and patient, however failed, due to patient having technical difficulties OR patient did not have access to video capability.  We continued and completed visit with audio only.  Some vital signs may be absent or patient reported.   Sheral Flow, LPN  Subjective:   Bianca Haley is a 72 y.o. female who presents for an Initial Medicare Annual Wellness Visit.  Review of Systems     Cardiac Risk Factors include: advanced age (>61mn, >>76women);hypertension;family history of premature cardiovascular disease     Objective:    Today's Vitals   05/06/22 1302  Weight: 142 lb (64.4 kg)  Height: 5' 5.5" (1.664 m)  PainSc: 0-No pain   Body mass index is 23.27 kg/m.     05/06/2022    1:10 PM 10/04/2021    5:44 PM 09/02/2021   12:49 PM 03/10/2021    8:05 AM 03/09/2021    3:37 PM 07/25/2020    5:24 PM 10/20/2018    6:54 AM  Advanced Directives  Does Patient Have a Medical Advance Directive? Yes No No No No No Yes  Type of [place]      [place] will  Copy of [place] Chart? No - copy requested        Would patient like information on creating a medical advance directive?  No - Patient declined No - Patient declined No - Patient declined  No - Patient declined      Current Medications (verified) Outpatient Encounter Medications as of 05/06/2022  Medication Sig   acetaminophen (TYLENOL) 650 MG CR tablet Take 1,300 mg by mouth every 8 (eight) hours as needed for pain.   albuterol (VENTOLIN HFA) 108 (90 Base) MCG/ACT inhaler INHALE 2 PUFFS INTO THE LUNGS EVERY 4 HOURS AS NEEDED FOR WHEEZING OR SHORTNESS OF BREATH   apixaban (ELIQUIS) 5 MG TABS tablet Take 1 tablet (5 mg total) by mouth 2 (two) times daily.   Ascorbic Acid (VITAMIN C PO) Take 1 tablet by mouth every morning.   benzonatate (TESSALON) 100 MG capsule Take 1 capsule (100 mg total) by mouth 3 (three) times daily as needed for cough.   Calcium Carb-Cholecalciferol (CALCIUM PLUS VITAMIN D3) 600-500 MG-UNIT CAPS Take 2 tablets by mouth every morning.   Cholecalciferol (VITAMIN D) 2000 units tablet Take 2,000 Units by mouth at bedtime.    cycloSPORINE (RESTASIS) 0.05 % ophthalmic emulsion Place 1 drop into both eyes every morning.   diazepam (VALIUM) 5 MG tablet Take 2.5 mg bid. Ok to take 2.5 mg in mid-day if needed.   diltiazem (CARDIZEM CD) 240 MG 24 hr capsule Take 1 capsule (240 mg total) by mouth every morning.   fluticasone (FLONASE) 50 MCG/ACT nasal spray Place 2 sprays into both nostrils daily as needed for allergies or rhinitis.   gabapentin (NEURONTIN) 300 MG capsule Take 4 capsules (1,200 mg total) by  mouth at bedtime.   irbesartan (AVAPRO) 150 MG tablet Take 1 tablet (150 mg total) by mouth daily.   metoprolol tartrate (LOPRESSOR) 50 MG tablet Take 1 tablet (50 mg total) by mouth 2 (two) times daily.   mirabegron ER (MYRBETRIQ) 50 MG TB24 tablet Take 1 tablet (50 mg total) by mouth every morning.   Multiple Vitamin (MULTIVITAMIN WITH MINERALS) TABS tablet Take 1 tablet by mouth 2 (two) times daily.   naloxone (NARCAN) nasal spray 4 mg/0.1 mL 1 actuation in one nostril once. May repeat in 2-3 min   OVER THE COUNTER MEDICATION Take 2 capsules by mouth in the morning. [facility]    oxyCODONE-acetaminophen (PERCOCET/ROXICET) 5-325 MG tablet Take 1 tablet by mouth every 8 (eight) hours as needed for severe pain.   oxyCODONE-acetaminophen (PERCOCET/ROXICET) 5-325 MG tablet Take 1 tablet by mouth every 8 (eight) hours as needed for severe pain.   pantoprazole (PROTONIX) 40 MG tablet Take 1 tablet (40 mg total) by mouth 2 (two) times daily. TAKE 1 TABLET(40 MG) BY MOUTH TWICE DAILY BEFORE A MEAL Strength: 40 mg   PARoxetine (PAXIL) 40 MG tablet Take 1 tablet (40 mg total) by mouth 2 (two) times daily.   potassium chloride (KLOR-CON) 10 MEQ tablet Take 1 tablet (10 mEq total) by mouth daily.   Probiotic CAPS Take 1 capsule by mouth every morning.   rosuvastatin (CRESTOR) 5 MG tablet Take 1 tablet (5 mg total) by mouth daily. TAKE 1 TABLET(5 MG) BY MOUTH DAILY Strength: 5 mg   SUMAtriptan (IMITREX) 100 MG tablet May repeat in 2 hours if headache persists or recurs.   traZODone (DESYREL) 150 MG tablet Take 1 tablet (150 mg total) by mouth at bedtime as needed. for sleep   vitamin B-12 (CYANOCOBALAMIN) 1000 MCG tablet Take 1,000 mcg by mouth every morning.   No facility-administered encounter medications on file as of 05/06/2022.    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   Hemorrhoids    History of colon polyps    History of endometriosis    and fibroids   History of hiatal hernia    History of UTI    Hypertension    Memory loss    Migraine    OSA (obstructive sleep apnea)    on nocturnal O2   Recurrent pneumonia 07/2018   Seasonal allergies    Vitamin  D deficiency    Wears glasses    Past Surgical History:  Procedure Laterality Date   ABDOMINAL HYSTERECTOMY     BACK SURGERY     BIOPSY  10/20/2018   Procedure: BIOPSY;  Surgeon: [name], MD;  Location: [facility];  Service: Endoscopy;;   BLADDER SUSPENSION     COLONOSCOPY  02/16/2008   normal   DENTAL SURGERY     ESOPHAGOGASTRODUODENOSCOPY (EGD) WITH PROPOFOL N/A 10/20/2018   Procedure: ESOPHAGOGASTRODUODENOSCOPY (EGD) WITH PROPOFOL;  Surgeon: [name], MD;  Location: [facility];  Service: Endoscopy;  Laterality: N/A;  EUS N/A 10/20/2018   Procedure: UPPER ENDOSCOPIC ULTRASOUND (EUS) RADIAL;  Surgeon: [name], MD;  Location: [facility];  Service: Endoscopy;  Laterality: N/A;   FOOT SURGERY Left    x3   laparoscopy     for evaluation of endometriosis   LEFT HEART CATH AND CORONARY ANGIOGRAPHY N/A 09/04/2021   Procedure: LEFT HEART CATH AND CORONARY ANGIOGRAPHY;  Surgeon: Jettie Booze, MD;  Location: [facility];  Service: Cardiovascular;  Laterality: N/A;   TOTAL KNEE ARTHROPLASTY Left 20-Jul-2015   Procedure: TOTAL LEFT KNEE ARTHROPLASTY;  Surgeon: Paralee Cancel, MD;  Location: [facility];  Service: Orthopedics;  Laterality: Left;   UPPER GI ENDOSCOPY     Family History  Problem Relation Age of Onset   Emphysema Mother    Heart disease Mother 61       MI   Social History   Socioeconomic History   Marital status: Widowed    Spouse name: Not on file   Number of children: 2   Years of education: 35   Highest education level: Not on file  Occupational History   Occupation: Futures trader - retired  Tobacco Use   Smoking status: Never   Smokeless tobacco: Never  Vaping Use   Vaping Use: Never used  Substance and Sexual Activity   Alcohol use: No    Alcohol/week: 0.0 standard drinks of alcohol    Comment: As of 2018-07-20, it is been several years since she drank any alcohol.   Drug use: No   Sexual activity: Yes    Partners: Male     Birth control/protection: Surgical  Other Topics Concern   Not on file  Social History Narrative   HS[employer], [employer] BS interior design. Married 07-19-1977 - 57 years, her husband died in 1997/07/19.  She has been with her current, second husband since around Jul 19, 2008.  1 dtr - July 20, 1982 - lawyer, 1 son July 20, 1987. No h/o abuse.   Social Determinants of Health   Financial Resource Strain: Low Risk  (05/06/2022)   Overall Financial Resource Strain (CARDIA)    Difficulty of Paying Living Expenses: Not hard at all  Food Insecurity: No Food Insecurity (05/06/2022)   Hunger Vital Sign    Worried About Running Out of Food in the Last Year: Never true    Ran Out of Food in the Last Year: Never true  Transportation Needs: No Transportation Needs (05/06/2022)   PRAPARE - Hydrologist (Medical): No    Lack of Transportation (Non-Medical): No  Physical Activity: Sufficiently Active (05/06/2022)   Exercise Vital Sign    Days of Exercise per Week: 5 days    Minutes of Exercise per Session: 60 min  Stress: No Stress Concern Present (05/06/2022)   [place]    Feeling of Stress : Not at all  Social Connections: Moderately Integrated (05/06/2022)   Social Connection and Isolation Panel [NHANES]    Frequency of Communication with Friends and Family: More than three times a week    Frequency of Social Gatherings with Friends and Family: More than three times a week    Attends Religious Services: More than 4 times per year    Active Member of [employer] or Organizations: Yes    Attends Archivist Meetings: More than 4 times per year    Marital Status: Widowed    Tobacco Counseling Counseling given: Not Answered   Clinical Intake:  Pre-visit preparation  completed: Yes  Pain : No/denies pain Pain Score: 0-No pain     BMI - recorded: 23.27 Nutritional Status: BMI of 19-24  Normal Nutritional Risks: None Diabetes: No  How often do you  need to have someone help you when you read instructions, pamphlets, or other written materials from your doctor or pharmacy?: 1 - Never What is the last grade level you completed in school?: HSG; Bachelor's Degree  Diabetic? no  Interpreter Needed?: No  Information entered by :: Lisette Abu, LPN.   Activities of Daily Living    05/06/2022    1:21 PM 09/03/2021    3:00 PM  In your present state of health, do you have any difficulty performing the following activities:  Hearing? 0 0  Vision? 0 0  Difficulty concentrating or making decisions? 0 0  Walking or climbing stairs? 0 0  Dressing or bathing? 0 0  Doing errands, shopping? 0 0  Preparing Food and eating ? N   Using the Toilet? N   In the past six months, have you accidently leaked urine? N   Do you have problems with loss of bowel control? N   Managing your Medications? N   Managing your Finances? N   Housekeeping or managing your Housekeeping? N     Patient Care Team: Plotnikov, [name], MD as PCP - General (Internal Medicine) Stanford Breed Denice Bors, MD as PCP - Cardiology (Cardiology) Rigoberto Noel, MD as Consulting Physician (Pulmonary Disease) Clydell Hakim, MD (Inactive) as Consulting Physician (Anesthesiology) Paralee Cancel, MD as Consulting Physician (Orthopedic Surgery) Star Age, MD as Attending Physician (Neurology) Reece Agar, MD as Consulting Physician (Pain Medicine) [place], Tami Lin, [place] as Physician Assistant (Cardiology) Monna Fam, MD as Consulting Physician (Ophthalmology) [facility] as Consulting Physician (Obstetrics and Gynecology)  Indicate any recent Medical Services you may have received from other than [facility] providers in the past year (date may be approximate).     Assessment:   This is a routine wellness examination for Bianca Haley.  Hearing/Vision screen Hearing Screening - Comments:: Denies hearing difficulties   Vision Screening - Comments:: Wears rx  glasses - up to date with routine eye exams with Dr. Baldemar Lenis   Dietary issues and exercise activities discussed: Current Exercise Habits: Home exercise routine;Structured exercise class, Type of exercise: Other - see comments;yoga;walking;stretching;strength training/weights;exercise ball;treadmill;calisthenics (Line Dancing 2 times a week, chair yoga 1 time a week, personal trainer 2 times a week), Time (Minutes): 60, Frequency (Times/Week): 5, Weekly Exercise (Minutes/Week): 300, Intensity: Moderate, Exercise limited by: respiratory conditions(s);cardiac condition(s)   Goals Addressed             This Visit's Progress    To maintain my current health status by continuing to eat healthy, stay physically active and socially active.        Depression Screen    05/06/2022    1:15 PM 03/11/2022    1:30 PM 09/18/2021   11:24 AM 09/02/2021   10:51 AM 02/07/2021   11:51 AM 07/31/2020   11:43 AM 02/08/2017    1:42 PM  PHQ 2/9 Scores  PHQ - 2 Score 0 0 1 3 0 0 0  PHQ- 9 Score '1 1 2 8       '$ Fall Risk    05/06/2022    1:12 PM 03/11/2022    1:30 PM 09/18/2021   11:23 AM 09/02/2021   10:50 AM 07/31/2020   11:43 AM  Fall Risk   Falls in the past  year? 0 0 0 0 0  Number falls in past yr: 0 1 0 0 0  Injury with Fall? 0 1 0 0 0  Risk for fall due to : No Fall Risks History of fall(s);Impaired balance/gait;Impaired mobility     Follow up Falls prevention discussed        FALL RISK PREVENTION PERTAINING TO THE HOME:  Any stairs in or around the home? Yes  If so, are there any without handrails? No  Home free of loose throw rugs in walkways, pet beds, electrical cords, etc? Yes  Adequate lighting in your home to reduce risk of falls? Yes   ASSISTIVE DEVICES UTILIZED TO PREVENT FALLS:  Life alert? No  Use of a cane, walker or w/c? No  Grab bars in the bathroom? Yes  Shower chair or bench in shower? Yes  Elevated toilet seat or a handicapped toilet? Yes   TIMED UP AND GO:  Was the  test performed? No . Phone Visit  Cognitive Function:    06/29/2019    1:21 PM 12/27/2018    1:10 PM 06/28/2018    1:03 PM  MMSE - Mini Mental State Exam  Orientation to time '4 5 4  '$ Orientation to Place '5 5 5  '$ Registration '3 3 3  '$ Attention/ Calculation '5 2 5  '$ Recall '3 3 3  '$ Language- name 2 objects '2 2 2  '$ Language- repeat '1 1 1  '$ Language- follow 3 step command '3 3 3  '$ Language- read & follow direction '1 1 1  '$ Write a sentence '1 1 1  '$ Copy design '1 1 1  '$ Total score '29 27 29        '$ 05/06/2022    1:21 PM  6CIT Screen  What Year? 0 points  What month? 0 points  What time? 0 points  Count back from 20 0 points  Months in reverse 0 points  Repeat phrase 0 points  Total Score 0 points    Immunizations Immunization History  Administered Date(s) Administered   Fluad Quad(high Dose 65+) 01/30/2019, 04/03/2020, 02/07/2021, 03/11/2022   Influenza Split 02/27/2015   Influenza Whole 01/30/2009   Influenza, High Dose Seasonal PF 02/08/2017, 01/04/2018   Influenza, Seasonal, Injecte, Preservative Fre 02/27/2015   Influenza,inj,Quad PF,6+ Mos 01/26/2013, 01/26/2014, 02/21/2016   Influenza-Unspecified 01/26/2013, 01/26/2014, 02/21/2016, 02/09/2017   Moderna SARS-COV2 Booster Vaccination 04/05/2020   Moderna Sars-Covid-2 Vaccination 05/25/2019, 06/22/2019   Pneumococcal Conjugate-13 01/26/2013   Pneumococcal Polysaccharide-23 03/23/2013, 04/03/2020   Td 01/30/2009   Td (Adult), 2 Lf Tetanus Toxid, Preservative Free 01/30/2009   Tdap 06/23/2018   Zoster Recombinat (Shingrix) 02/07/2018    TDAP status: Up to date  Flu Vaccine status: Up to date  Pneumococcal vaccine status: Up to date  Covid-19 vaccine status: Completed vaccines  Qualifies for Shingles Vaccine? Yes   Zostavax completed No   Shingrix Completed?: No.    Education has been provided regarding the importance of this vaccine. Patient has been advised to call insurance company to determine out of pocket expense if they  have not yet received this vaccine. Advised may also receive vaccine at local pharmacy or Health Dept. Verbalized acceptance and understanding.  Screening Tests Health Maintenance  Topic Date Due   Zoster Vaccines- Shingrix (2 of 2) 04/04/2018   COLONOSCOPY (Pts 45-27yr Insurance coverage will need to be confirmed)  04/13/2019   COVID-19 Vaccine (3 - Moderna risk series) 05/03/2020   MAMMOGRAM  08/23/2022   Medicare Annual Wellness (AWV)  05/07/2023  DTaP/Tdap/Td (3 - Td or Tdap) 06/23/2028   Pneumonia Vaccine 45+ Years old  Completed   INFLUENZA VACCINE  Completed   DEXA SCAN  Completed   Hepatitis C Screening  Completed   HPV VACCINES  Aged Out    Health Maintenance  Health Maintenance Due  Topic Date Due   Zoster Vaccines- Shingrix (2 of 2) 04/04/2018   COLONOSCOPY (Pts 45-63yr Insurance coverage will need to be confirmed)  04/13/2019   COVID-19 Vaccine (3 - Moderna risk series) 05/03/2020    Colorectal cancer screening: Referral to GI placed 05/06/2022. Pt aware the office will call re: appt.  Mammogram status: Completed 08/22/2020 per patient with OB/GYN. Repeat every year  Bone Density status: Ordered 05/06/2022. Pt provided with contact info and advised to call to schedule appt.  Lung Cancer Screening: (Low Dose CT Chest recommended if Age 286-80years, 30 pack-year currently smoking OR have quit w/in 15years.) does not qualify.   Lung Cancer Screening Referral: no  Additional Screening:  Hepatitis C Screening: does qualify; Completed 09/26/2015  Vision Screening: Recommended annual ophthalmology exams for early detection of glaucoma and other disorders of the eye. Is the patient up to date with their annual eye exam?  Yes  Who is the provider or what is the name of the office in which the patient attends annual eye exams? KMonna Fam MD. If pt is not established with a provider, would they like to be referred to a provider to establish care? No .   Dental  Screening: Recommended annual dental exams for proper oral hygiene  Community Resource Referral / Chronic Care Management: CRR required this visit?  No   CCM required this visit?  No      Plan:     I have personally reviewed and noted the following in the patient's chart:   Medical and social history Use of alcohol, tobacco or illicit drugs  Current medications and supplements including opioid prescriptions. Patient is currently taking opioid prescriptions. Information provided to patient regarding non-opioid alternatives. Patient advised to discuss non-opioid treatment plan with their provider. Functional ability and status Nutritional status Physical activity Advanced directives List of other physicians Hospitalizations, surgeries, and ER visits in previous 12 months Vitals Screenings to include cognitive, depression, and falls Referrals and appointments  In addition, I have reviewed and discussed with patient certain preventive protocols, quality metrics, and best practice recommendations. A written personalized care plan for preventive services as well as general preventive health recommendations were provided to patient.     SSheral Flow LPN   17/10/[id number]  Nurse Notes: N/A   Medical screening examination/treatment/procedure(s) were performed by non-physician practitioner and as supervising physician I was immediately available for consultation/collaboration.  I agree with above. ALew Dawes MD

## 2022-05-06 NOTE — Patient Instructions (Addendum)
Bianca Haley , Thank you for taking time to come for your Medicare Wellness Visit. I appreciate your ongoing commitment to your health goals. Please review the following plan we discussed and let me know if I can assist you in the future.   These are the goals we discussed:  Goals      To maintain my current health status by continuing to eat healthy, stay physically active and socially active.        This is a list of the screening recommended for you and due dates:  Health Maintenance  Topic Date Due   Zoster (Shingles) Vaccine (2 of 2) 04/04/2018   Colon Cancer Screening  04/13/2019   COVID-19 Vaccine (3 - Moderna risk series) 05/03/2020   Mammogram  08/23/2022   Medicare Annual Wellness Visit  05/07/2023   DTaP/Tdap/Td vaccine (3 - Td or Tdap) 06/23/2028   Pneumonia Vaccine  Completed   Flu Shot  Completed   DEXA scan (bone density measurement)  Completed   Hepatitis C Screening: USPSTF Recommendation to screen - Ages 88-79 yo.  Completed   HPV Vaccine  Aged Out    Advanced directives: Yes; needs updating per patient.  Conditions/risks identified: Yes  Next appointment: Follow up in one year for your annual wellness visit.   Preventive Care 49 Years and Older, Female Preventive care refers to lifestyle choices and visits with your health care provider that can promote health and wellness. What does preventive care include? A yearly physical exam. This is also called an annual well check. Dental exams once or twice a year. Routine eye exams. Ask your health care provider how often you should have your eyes checked. Personal lifestyle choices, including: Daily care of your teeth and gums. Regular physical activity. Eating a healthy diet. Avoiding tobacco and drug use. Limiting alcohol use. Practicing safe sex. Taking low-dose aspirin every day. Taking vitamin and mineral supplements as recommended by your health care provider. What happens during an annual well  check? The services and screenings done by your health care provider during your annual well check will depend on your age, overall health, lifestyle risk factors, and family history of disease. Counseling  Your health care provider may ask you questions about your: Alcohol use. Tobacco use. Drug use. Emotional well-being. Home and relationship well-being. Sexual activity. Eating habits. History of falls. Memory and ability to understand (cognition). Work and work Statistician. Reproductive health. Screening  You may have the following tests or measurements: Height, weight, and BMI. Blood pressure. Lipid and cholesterol levels. These may be checked every 5 years, or more frequently if you are over 46 years old. Skin check. Lung cancer screening. You may have this screening every year starting at age 36 if you have a 30-pack-year history of smoking and currently smoke or have quit within the past 15 years. Fecal occult blood test (FOBT) of the stool. You may have this test every year starting at age 35. Flexible sigmoidoscopy or colonoscopy. You may have a sigmoidoscopy every 5 years or a colonoscopy every 10 years starting at age 25. Hepatitis C blood test. Hepatitis B blood test. Sexually transmitted disease (STD) testing. Diabetes screening. This is done by checking your blood sugar (glucose) after you have not eaten for a while (fasting). You may have this done every 1-3 years. Bone density scan. This is done to screen for osteoporosis. You may have this done starting at age 42. Mammogram. This may be done every 1-2 years. Talk to your  health care provider about how often you should have regular mammograms. Talk with your health care provider about your test results, treatment options, and if necessary, the need for more tests. Vaccines  Your health care provider may recommend certain vaccines, such as: Influenza vaccine. This is recommended every year. Tetanus, diphtheria, and  acellular pertussis (Tdap, Td) vaccine. You may need a Td booster every 10 years. Zoster vaccine. You may need this after age 29. Pneumococcal 13-valent conjugate (PCV13) vaccine. One dose is recommended after age 7. Pneumococcal polysaccharide (PPSV23) vaccine. One dose is recommended after age 32. Talk to your health care provider about which screenings and vaccines you need and how often you need them. This information is not intended to replace advice given to you by your health care provider. Make sure you discuss any questions you have with your health care provider. Document Released: 05/17/2015 Document Revised: 01/08/2016 Document Reviewed: 02/19/2015 Elsevier Interactive Patient Education  2017 [place] Prevention in the Home Falls can cause injuries. They can happen to people of all ages. There are many things you can do to make your home safe and to help prevent falls. What can I do on the outside of my home? Regularly fix the edges of walkways and driveways and fix any cracks. Remove anything that might make you trip as you walk through a door, such as a raised step or threshold. Trim any bushes or trees on the path to your home. Use bright outdoor lighting. Clear any walking paths of anything that might make someone trip, such as rocks or tools. Regularly check to see if handrails are loose or broken. Make sure that both sides of any steps have handrails. Any raised decks and porches should have guardrails on the edges. Have any leaves, snow, or ice cleared regularly. Use sand or salt on walking paths during winter. Clean up any spills in your garage right away. This includes oil or grease spills. What can I do in the bathroom? Use night lights. Install grab bars by the toilet and in the tub and shower. Do not use towel bars as grab bars. Use non-skid mats or decals in the tub or shower. If you need to sit down in the shower, use a plastic, non-slip stool. Keep  the floor dry. Clean up any water that spills on the floor as soon as it happens. Remove soap buildup in the tub or shower regularly. Attach bath mats securely with double-sided non-slip rug tape. Do not have throw rugs and other things on the floor that can make you trip. What can I do in the bedroom? Use night lights. Make sure that you have a light by your bed that is easy to reach. Do not use any sheets or blankets that are too big for your bed. They should not hang down onto the floor. Have a firm chair that has side arms. You can use this for support while you get dressed. Do not have throw rugs and other things on the floor that can make you trip. What can I do in the kitchen? Clean up any spills right away. Avoid walking on wet floors. Keep items that you use a lot in easy-to-reach places. If you need to reach something above you, use a strong step stool that has a grab bar. Keep electrical cords out of the way. Do not use floor polish or wax that makes floors slippery. If you must use wax, use non-skid floor wax. Do not have  throw rugs and other things on the floor that can make you trip. What can I do with my stairs? Do not leave any items on the stairs. Make sure that there are handrails on both sides of the stairs and use them. Fix handrails that are broken or loose. Make sure that handrails are as long as the stairways. Check any carpeting to make sure that it is firmly attached to the stairs. Fix any carpet that is loose or worn. Avoid having throw rugs at the top or bottom of the stairs. If you do have throw rugs, attach them to the floor with carpet tape. Make sure that you have a light switch at the top of the stairs and the bottom of the stairs. If you do not have them, ask someone to add them for you. What else can I do to help prevent falls? Wear shoes that: Do not have high heels. Have rubber bottoms. Are comfortable and fit you well. Are closed at the toe. Do not  wear sandals. If you use a stepladder: Make sure that it is fully opened. Do not climb a closed stepladder. Make sure that both sides of the stepladder are locked into place. Ask someone to hold it for you, if possible. Clearly mark and make sure that you can see: Any grab bars or handrails. First and last steps. Where the edge of each step is. Use tools that help you move around (mobility aids) if they are needed. These include: Canes. Walkers. Scooters. Crutches. Turn on the lights when you go into a dark area. Replace any light bulbs as soon as they burn out. Set up your furniture so you have a clear path. Avoid moving your furniture around. If any of your floors are uneven, fix them. If there are any pets around you, be aware of where they are. Review your medicines with your doctor. Some medicines can make you feel dizzy. This can increase your chance of falling. Ask your doctor what other things that you can do to help prevent falls. This information is not intended to replace advice given to you by your health care provider. Make sure you discuss any questions you have with your health care provider. Document Released: 02/14/2009 Document Revised: 09/26/2015 Document Reviewed: 05/25/2014 Elsevier Interactive Patient Education  2017 [employer].

## 2022-05-06 NOTE — Telephone Encounter (Signed)
Done. Thx.

## 2022-05-06 NOTE — Telephone Encounter (Signed)
MD sent rx to [facility] since new year will be using Bianca Haley. Updated pharmacy pls resend.Marland KitchenJohny Chess

## 2022-05-06 NOTE — Telephone Encounter (Signed)
Ok Thx 

## 2022-05-08 ENCOUNTER — Telehealth: Payer: Self-pay | Admitting: Internal Medicine

## 2022-05-08 MED ORDER — MIRABEGRON ER 50 MG PO TB24: 50.0000 mg | ORAL_TABLET | Freq: Every morning | ORAL | 5 refills | Status: DC

## 2022-05-08 NOTE — Telephone Encounter (Signed)
Caller & Relationship to patient: patient   Call back number:[phone]   Date of last office visit:   Date of next office visit:  05/12/2022   Medication(s) to be refilled: Myrebetriq   Pharmacy has changed        Preferred Pharmacy:  [facility] Oppen[facility] at [facility]

## 2022-05-08 NOTE — Telephone Encounter (Signed)
Rx sent to [facility],,,/lb

## 2022-05-12 ENCOUNTER — Encounter: Payer: Self-pay | Admitting: Internal Medicine

## 2022-05-12 ENCOUNTER — Ambulatory Visit (INDEPENDENT_AMBULATORY_CARE_PROVIDER_SITE_OTHER): Payer: Medicare Other | Admitting: Internal Medicine

## 2022-05-12 ENCOUNTER — Telehealth: Payer: Self-pay | Admitting: Internal Medicine

## 2022-05-12 VITALS — BP 124/76 | HR 55 | Temp 98.4°F | Ht 65.5 in | Wt 143.0 lb

## 2022-05-12 DIAGNOSIS — F419 Anxiety disorder, unspecified: Secondary | ICD-10-CM

## 2022-05-12 DIAGNOSIS — F4321 Adjustment disorder with depressed mood: Secondary | ICD-10-CM | POA: Diagnosis not present

## 2022-05-12 DIAGNOSIS — I10 Essential (primary) hypertension: Secondary | ICD-10-CM

## 2022-05-12 DIAGNOSIS — I480 Paroxysmal atrial fibrillation: Secondary | ICD-10-CM | POA: Diagnosis not present

## 2022-05-12 MED ORDER — MIRABEGRON ER 50 MG PO TB24: 50.0000 mg | ORAL_TABLET | Freq: Every morning | ORAL | 5 refills | Status: DC

## 2022-05-12 MED ORDER — OXYCODONE-ACETAMINOPHEN 5-325 MG PO TABS: 1.0000 | ORAL_TABLET | Freq: Three times a day (TID) | ORAL | 0 refills | Status: DC | PRN

## 2022-05-12 MED ORDER — POTASSIUM CHLORIDE ER 10 MEQ PO TBCR: 10.0000 meq | EXTENDED_RELEASE_TABLET | Freq: Every day | ORAL | 5 refills | Status: DC

## 2022-05-12 MED ORDER — FLUTICASONE PROPIONATE 50 MCG/ACT NA SUSP: 2.0000 | Freq: Every day | NASAL | 5 refills | Status: DC | PRN

## 2022-05-12 MED ORDER — APIXABAN 5 MG PO TABS: 5.0000 mg | ORAL_TABLET | Freq: Two times a day (BID) | ORAL | 5 refills | Status: DC

## 2022-05-12 NOTE — Progress Notes (Signed)
Subjective:  Patient ID: [name], female    DOB: 10/11/1950  Age: 72 y.o. MRN: [id number]  CC: Follow-up (Eliqus , oxycodone , myrbertiq , potassium, flonase  refills would like 3 month supply on these medications , would like to set up bone density and colonoscopy )   HPI [name] presents for chronic LBP, A fib, anxiety  Outpatient Medications Prior to Visit  Medication Sig Dispense Refill   acetaminophen (TYLENOL) 650 MG CR tablet Take 1,300 mg by mouth every 8 (eight) hours as needed for pain.     albuterol (VENTOLIN HFA) 108 (90 Base) MCG/ACT inhaler INHALE 2 PUFFS INTO THE LUNGS EVERY 4 HOURS AS NEEDED FOR WHEEZING OR SHORTNESS OF BREATH 18 g 5   Ascorbic Acid (VITAMIN C PO) Take 1 tablet by mouth every morning.     benzonatate (TESSALON) 100 MG capsule Take 1 capsule (100 mg total) by mouth 3 (three) times daily as needed for cough. 20 capsule 0   Calcium Carb-Cholecalciferol (CALCIUM PLUS VITAMIN D3) 600-500 MG-UNIT CAPS Take 2 tablets by mouth every morning.     Cholecalciferol (VITAMIN D) 2000 units tablet Take 2,000 Units by mouth at bedtime.      cycloSPORINE (RESTASIS) 0.05 % ophthalmic emulsion Place 1 drop into both eyes every morning.     diazepam (VALIUM) 5 MG tablet Take 2.5 mg bid. Ok to take 2.5 mg in mid-day if needed. 30 tablet 3   diltiazem (CARDIZEM CD) 240 MG 24 hr capsule Take 1 capsule (240 mg total) by mouth every morning. 90 capsule 3   fluticasone (FLONASE) 50 MCG/ACT nasal spray Place 2 sprays into both nostrils daily as needed for allergies or rhinitis.     irbesartan (AVAPRO) 150 MG tablet Take 1 tablet (150 mg total) by mouth daily. 30 tablet 5   metoprolol tartrate (LOPRESSOR) 50 MG tablet Take 1 tablet (50 mg total) by mouth 2 (two) times daily. 60 tablet 5   Multiple Vitamin (MULTIVITAMIN WITH MINERALS) TABS tablet Take 1 tablet by mouth 2 (two) times daily.     naloxone (NARCAN) nasal spray 4 mg/0.1 mL 1 actuation in one nostril once. May  repeat in 2-3 min 1 each 2   OVER THE COUNTER MEDICATION Take 2 capsules by mouth in the morning. [place]     pantoprazole (PROTONIX) 40 MG tablet Take 1 tablet (40 mg total) by mouth 2 (two) times daily. TAKE 1 TABLET(40 MG) BY MOUTH TWICE DAILY BEFORE A MEAL Strength: 40 mg 60 tablet 5   PARoxetine (PAXIL) 40 MG tablet Take 1 tablet (40 mg total) by mouth 2 (two) times daily. 60 tablet 5   Probiotic CAPS Take 1 capsule by mouth every morning.     rosuvastatin (CRESTOR) 5 MG tablet Take 1 tablet (5 mg total) by mouth daily. TAKE 1 TABLET(5 MG) BY MOUTH DAILY Strength: 5 mg 30 tablet 5   SUMAtriptan (IMITREX) 100 MG tablet May repeat in 2 hours if headache persists or recurs. 12 tablet 5   traZODone (DESYREL) 150 MG tablet Take 1 tablet (150 mg total) by mouth at bedtime as needed. for sleep 30 tablet 5   vitamin B-12 (CYANOCOBALAMIN) 1000 MCG tablet Take 1,000 mcg by mouth every morning.     apixaban (ELIQUIS) 5 MG TABS tablet Take 1 tablet (5 mg total) by mouth 2 (two) times daily. 60 tablet 5   mirabegron ER (MYRBETRIQ) 50 MG TB24 tablet Take 1 tablet (50 mg total) by  mouth every morning. 30 tablet 5   oxyCODONE-acetaminophen (PERCOCET/ROXICET) 5-325 MG tablet Take 1 tablet by mouth every 8 (eight) hours as needed for severe pain. 90 tablet 0   potassium chloride (KLOR-CON) 10 MEQ tablet Take 1 tablet (10 mEq total) by mouth daily. 30 tablet 5   gabapentin (NEURONTIN) 300 MG capsule Take 4 capsules (1,200 mg total) by mouth at bedtime. 120 capsule 0   oxyCODONE-acetaminophen (PERCOCET/ROXICET) 5-325 MG tablet Take 1 tablet by mouth every 8 (eight) hours as needed for severe pain. 90 tablet 0   No facility-administered medications prior to visit.    ROS: Review of Systems  Constitutional:  Positive for fatigue. Negative for activity change, appetite change, chills and unexpected weight change.  HENT:  Negative for congestion, mouth sores and sinus pressure.   Eyes:  Negative for visual  disturbance.  Respiratory:  Negative for cough and chest tightness.   Gastrointestinal:  Negative for abdominal pain and nausea.  Genitourinary:  Negative for difficulty urinating, frequency and vaginal pain.  Musculoskeletal:  Positive for back pain. Negative for gait problem.  Skin:  Negative for pallor and rash.  Neurological:  Negative for dizziness, tremors, weakness, numbness and headaches.  Psychiatric/Behavioral:  Negative for confusion, sleep disturbance and suicidal ideas. The patient is nervous/anxious.     Objective:  BP 124/76 (BP Location: Left Arm, Patient Position: Sitting, Cuff Size: Normal)   Pulse (!) 55   Temp 98.4 F (36.9 C) (Oral)   Ht 5' 5.5" (1.664 m)   Wt 143 lb (64.9 kg)   SpO2 98%   BMI 23.43 kg/m   BP Readings from Last 3 Encounters:  05/12/22 124/76  03/11/22 136/72  01/09/22 (!) 166/88    Wt Readings from Last 3 Encounters:  05/12/22 143 lb (64.9 kg)  05/06/22 142 lb (64.4 kg)  03/11/22 140 lb 3.2 oz (63.6 kg)    Physical Exam Constitutional:      General: She is not in acute distress.    Appearance: She is well-developed. She is obese.  HENT:     Head: Normocephalic.     Right Ear: External ear normal.     Left Ear: External ear normal.     Nose: Nose normal.  Eyes:     General:        Right eye: No discharge.        Left eye: No discharge.     Conjunctiva/sclera: Conjunctivae normal.     Pupils: Pupils are equal, round, and reactive to light.  Neck:     Thyroid: No thyromegaly.     Vascular: No JVD.     Trachea: No tracheal deviation.  Cardiovascular:     Rate and Rhythm: Normal rate and regular rhythm.     Heart sounds: Normal heart sounds.  Pulmonary:     Effort: No respiratory distress.     Breath sounds: No stridor. No wheezing.  Abdominal:     General: Bowel sounds are normal. There is no distension.     Palpations: Abdomen is soft. There is no mass.     Tenderness: There is no abdominal tenderness. There is no  guarding or rebound.  Musculoskeletal:        General: Tenderness present.     Cervical back: Normal range of motion and neck supple. No rigidity.  Lymphadenopathy:     Cervical: No cervical adenopathy.  Skin:    Findings: No erythema or rash.  Neurological:     Cranial Nerves: No cranial  nerve deficit.     Motor: No abnormal muscle tone.     Coordination: Coordination normal.     Deep Tendon Reflexes: Reflexes normal.  Psychiatric:        Behavior: Behavior normal.        Thought Content: Thought content normal.        Judgment: Judgment normal.   LS w/pain  Lab Results  Component Value Date   WBC 5.4 10/08/2021   HGB 14.5 10/08/2021   HCT 42.3 10/08/2021   PLT 256 10/08/2021   GLUCOSE 90 03/11/2022   CHOL 155 04/03/2020   TRIG 102.0 04/03/2020   HDL 61.40 04/03/2020   LDLCALC 73 04/03/2020   ALT 9 03/11/2022   AST 20 03/11/2022   NA 140 03/11/2022   K 4.3 03/11/2022   CL 100 03/11/2022   CREATININE 0.80 03/11/2022   BUN 21 03/11/2022   CO2 36 (H) 03/11/2022   TSH 1.43 09/18/2021   INR 1.0 09/21/2018   HGBA1C 6.1 06/23/2018    CT Angio Chest Pulmonary Embolism (PE) W or WO Contrast  Result Date: 10/05/2021 CLINICAL DATA:  Chest pain or SOB, pleurisy or effusion suspected EXAM: CT ANGIOGRAPHY CHEST WITH CONTRAST TECHNIQUE: Multidetector CT imaging of the chest was performed using the standard protocol during bolus administration of intravenous contrast. Multiplanar CT image reconstructions and MIPs were obtained to evaluate the vascular anatomy. RADIATION DOSE REDUCTION: This exam was performed according to the departmental dose-optimization program which includes automated exposure control, adjustment of the mA and/or kV according to patient size and/or use of iterative reconstruction technique. CONTRAST:  30m OMNIPAQUE IOHEXOL 350 MG/ML SOLN COMPARISON:  CT dated March 10, 2021 FINDINGS: Cardiovascular: Evaluation of the bases is limited secondary to respiratory  motion. No acute pulmonary embolism through the segmental pulmonary arteries. Heart is at the upper limits of normal. Scattered soft and calcified atherosclerotic plaque throughout the course of the nonaneurysmal thoracic aorta. No pericardial effusion. Mediastinum/Nodes: No axillary adenopathy. Visualized thyroid is unremarkable. Increase in size of multiple mediastinal lymph nodes. Representative pretracheal lymph node measures d 13 mm in the short axis, previously 6 mm (series 6, image 79). Additional representative pretracheal lymph node measures 15 mm in the short axis, previously 10 mm (series 6, image 108). RIGHT hilar lymph node measures 14 mm in the short axis (series 6, image 141). Lungs/Pleura: Trace RIGHT pleural effusion. There is bronchial wall thickening. Multifocal endobronchial debris is noted. Hypoenhancing centrilobular consolidative opacities most predominately within the bases. Scattered ground-glass opacities are noted in the apices. There is a likely background of mosaic attenuation, likely reflecting air trapping. Several consolidative opacities are more nodular in appearance with representative consolidative opacity versus nodule measuring 6 mm (series 6, image 170). Upper Abdomen: Small hiatal hernia. Mildly nodular contour of the liver with hypertrophy of the LEFT liver could reflect underlying cirrhosis Musculoskeletal: Degenerative changes of the thoracic spine. Review of the MIP images confirms the above findings. IMPRESSION: 1. No acute pulmonary embolism. 2. There is bronchial wall thickening with endobronchial debris and multifocal hypoenhancing consolidative opacities. Findings likely reflect infection versus aspiration. Given nodular appearance of several opacities, recommend follow-up CT in 3 months to assess for appropriate resolution. 3. Interval increase in size of multiple mediastinal lymph nodes. This is likely reactive in etiology. Recommend attention on CT. 4. Trace RIGHT  pleural effusion. 5. Morphologic changes of the liver which could reflect underlying cirrhosis. Aortic Atherosclerosis (ICD10-I70.0). Electronically Signed   By: SValentino SaxonM.D.  On: 10/05/2021 15:36   CT Head Wo Contrast  Result Date: 10/04/2021 CLINICAL DATA:  Mental status change, unknown cause unresponsive at home - recently started on A/C - evaluate for ICH EXAM: CT HEAD WITHOUT CONTRAST TECHNIQUE: Contiguous axial images were obtained from the base of the skull through the vertex without intravenous contrast. RADIATION DOSE REDUCTION: This exam was performed according to the departmental dose-optimization program which includes automated exposure control, adjustment of the mA and/or kV according to patient size and/or use of iterative reconstruction technique. COMPARISON:  MRI head 12/28/2017 report without imaging, CT head 07/25/2020 BRAIN: BRAIN No evidence of large-territorial acute infarction. No parenchymal hemorrhage. No mass lesion. No extra-axial collection. No mass effect or midline shift. No hydrocephalus. Basilar cisterns are patent. Vascular: No hyperdense vessel. Skull: No acute fracture or focal lesion. Limited evaluation for skull base fracture due to motion artifact. The Sinuses/Orbits: Paranasal sinuses and mastoid air cells are clear. Bilateral lens replacement. The orbits are unremarkable. Other: None. IMPRESSION: No acute intracranial abnormality. Electronically Signed   By: Iven Finn M.D.   On: 10/04/2021 17:17   DG Chest Portable 1 View  Result Date: 10/04/2021 CLINICAL DATA:  Altered mental status.  Possible aspiration. EXAM: PORTABLE CHEST 1 VIEW COMPARISON:  09/02/2021 and prior radiographs FINDINGS: UPPER limits normal heart size and peribronchial thickening again noted. There is no evidence of focal airspace disease, pulmonary edema, suspicious pulmonary nodule/mass, pleural effusion, or pneumothorax. No acute bony abnormalities are identified. IMPRESSION: 1. No  evidence of acute cardiopulmonary disease. 2. Chronic peribronchial thickening. Electronically Signed   By: Margarette Canada M.D.   On: 10/04/2021 16:26    Assessment & Plan:   Problem List Items Addressed This Visit       Cardiovascular and Mediastinum   Paroxysmal atrial fibrillation (HCC) - Primary    Cont on Eliquis      Relevant Medications   apixaban (ELIQUIS) 5 MG TABS tablet   Essential hypertension    Stable. Cont w/Atenolol, Diltiazem,  Maxzide      Relevant Medications   apixaban (ELIQUIS) 5 MG TABS tablet      Meds ordered this encounter  Medications   oxyCODONE-acetaminophen (PERCOCET/ROXICET) 5-325 MG tablet    Sig: Take 1 tablet by mouth every 8 (eight) hours as needed for severe pain.    Dispense:  90 tablet    Refill:  0    Please fill on or after 05/12/22   oxyCODONE-acetaminophen (PERCOCET/ROXICET) 5-325 MG tablet    Sig: Take 1 tablet by mouth every 8 (eight) hours as needed for severe pain.    Dispense:  90 tablet    Refill:  0    Please fill on or after 06/11/22   apixaban (ELIQUIS) 5 MG TABS tablet    Sig: Take 1 tablet (5 mg total) by mouth 2 (two) times daily.    Dispense:  60 tablet    Refill:  5   mirabegron ER (MYRBETRIQ) 50 MG TB24 tablet    Sig: Take 1 tablet (50 mg total) by mouth every morning.    Dispense:  30 tablet    Refill:  5   potassium chloride (KLOR-CON) 10 MEQ tablet    Sig: Take 1 tablet (10 mEq total) by mouth daily.    Dispense:  30 tablet    Refill:  5      Follow-up: No follow-ups on file.  [name], MD

## 2022-05-12 NOTE — Telephone Encounter (Signed)
Sent Flonase to [facility].Marland KitchenJohny Chess

## 2022-05-12 NOTE — Assessment & Plan Note (Signed)
Stable. Cont w/Atenolol, Diltiazem,  Maxzide

## 2022-05-12 NOTE — Assessment & Plan Note (Signed)
Counseling was offered  Bianca Haley moved from [place] [employer] in 3/23. Coping ok

## 2022-05-12 NOTE — Telephone Encounter (Signed)
Patient was seen today - she picked up all her prescriptions but she still needs a flonase sent in.  Pharmacy:  [facility] Oppen[facility] at [facility]

## 2022-05-12 NOTE — Assessment & Plan Note (Signed)
On Paxil 40 mg BID for many years

## 2022-05-12 NOTE — Assessment & Plan Note (Signed)
Cont on Eliquis- 

## 2022-05-13 DIAGNOSIS — K080 Exfoliation of teeth due to systemic causes: Secondary | ICD-10-CM | POA: Diagnosis not present

## 2022-05-13 DIAGNOSIS — F411 Generalized anxiety disorder: Secondary | ICD-10-CM | POA: Diagnosis not present

## 2022-05-26 ENCOUNTER — Telehealth: Payer: Self-pay | Admitting: Internal Medicine

## 2022-05-26 DIAGNOSIS — F411 Generalized anxiety disorder: Secondary | ICD-10-CM | POA: Diagnosis not present

## 2022-05-26 NOTE — Telephone Encounter (Signed)
Caller & Relationship to patient:  self   Call back [phone]   Date of last office visit:  05/12/2022   Date of next office visit:  08/12/2022   Medication(s) to be refilled:  irbesartan, Dizaepam        Preferred Pharmacy:  Kristopher Oppenheim at [facility]

## 2022-05-27 MED ORDER — IRBESARTAN 150 MG PO TABS: 150.0000 mg | ORAL_TABLET | Freq: Every day | ORAL | 11 refills | Status: DC

## 2022-05-27 MED ORDER — DIAZEPAM 5 MG PO TABS: ORAL_TABLET | ORAL | 3 refills | Status: DC

## 2022-05-27 NOTE — Telephone Encounter (Signed)
Okay.  Done.  Thanks 

## 2022-05-28 ENCOUNTER — Other Ambulatory Visit: Payer: Self-pay | Admitting: Internal Medicine

## 2022-05-28 ENCOUNTER — Telehealth: Payer: Self-pay | Admitting: *Deleted

## 2022-05-28 NOTE — Telephone Encounter (Signed)
Rec'd PA on cover-my-meds need PA on Sumtriptan Submitted w/ (Key: [id number]). It states Your information is being submitted to [place].Marland KitchenJohny Chess

## 2022-05-29 NOTE — Telephone Encounter (Signed)
Rec'd msg back stating This request has received a Cancelled outcome. This may mean either your patient does not have active coverage with this plan.Marland KitchenJohny Haley

## 2022-06-01 ENCOUNTER — Telehealth: Payer: Self-pay | Admitting: Internal Medicine

## 2022-06-01 NOTE — Telephone Encounter (Signed)
Per chart refill has been sent to [facility].Marland KitchenJohny Chess

## 2022-06-01 NOTE — Telephone Encounter (Signed)
Caller & Relationship to patient: Self  Call back number: [phone]   Date of last office visit: 1.9.24  Date of next office visit: 4.10.24  Medication(s) to be refilled:  pantoprazole (PROTONIX) 40 MG tablet   Preferred Pharmacy:  [facility] [id number]   Phone: [phone]  Fax: [phone]

## 2022-06-02 DIAGNOSIS — F411 Generalized anxiety disorder: Secondary | ICD-10-CM | POA: Diagnosis not present

## 2022-06-02 DIAGNOSIS — K080 Exfoliation of teeth due to systemic causes: Secondary | ICD-10-CM | POA: Diagnosis not present

## 2022-06-08 ENCOUNTER — Other Ambulatory Visit: Payer: Self-pay | Admitting: Internal Medicine

## 2022-06-09 ENCOUNTER — Telehealth: Payer: Self-pay | Admitting: Internal Medicine

## 2022-06-09 DIAGNOSIS — F411 Generalized anxiety disorder: Secondary | ICD-10-CM | POA: Diagnosis not present

## 2022-06-09 MED ORDER — METOPROLOL TARTRATE 50 MG PO TABS: ORAL_TABLET | ORAL | 11 refills | Status: DC

## 2022-06-09 NOTE — Telephone Encounter (Signed)
MEDICATION:metoprolol tartrate (LOPRESSOR) 50 MG tablet   [place] at [employer]  Comments: Patient is requesting her refill be sent to her new pharmacy. Pharmacy phone number is [phone]  **Let patient know to contact pharmacy at the end of the day to make sure medication is ready. **  ** Please notify patient to allow 48-72 hours to process**  **Encourage patient to contact the pharmacy for refills or they can request refills through [facility]**

## 2022-06-09 NOTE — Telephone Encounter (Signed)
Sent refill to [facility].Marland KitchenJohny Chess

## 2022-06-10 DIAGNOSIS — K080 Exfoliation of teeth due to systemic causes: Secondary | ICD-10-CM | POA: Diagnosis not present

## 2022-06-15 ENCOUNTER — Telehealth: Payer: Self-pay

## 2022-06-15 NOTE — Telephone Encounter (Signed)
Pt has asked for a med refill for   oxyCODONE-acetaminophen (PERCOCET/ROXICET) 5-325 MG table

## 2022-06-16 MED ORDER — OXYCODONE-ACETAMINOPHEN 5-325 MG PO TABS: 1.0000 | ORAL_TABLET | Freq: Three times a day (TID) | ORAL | 0 refills | Status: DC | PRN

## 2022-06-16 NOTE — Telephone Encounter (Signed)
She had a prescription to be filled on 06/11/2022 I issued a new prescription for March Thanks

## 2022-06-16 NOTE — Addendum Note (Signed)
Addended by: [name] on: 06/16/2022 07:40 AM   Modules accepted: Orders

## 2022-06-17 ENCOUNTER — Telehealth: Payer: Self-pay | Admitting: Internal Medicine

## 2022-06-17 NOTE — Telephone Encounter (Signed)
Pt called stated that she didn't receive a refill in February for oxyCODONE-acetaminophen (PERCOCET/ROXICET) 5-325 MG tablet. Pt not understanding why she have to wait until March 7th to get her refill. I let the pt know what was noted and pt stated they would like a call back.

## 2022-06-18 NOTE — Telephone Encounter (Signed)
Called pt no answer & can't leave msg due to vm being full. Will call later.Marland KitchenJohny Haley

## 2022-06-18 NOTE — Telephone Encounter (Signed)
Notified pt w/ MD response. Pt states she receive call this am from pharmacy will pick-up today..Bianca Haley

## 2022-06-18 NOTE — Telephone Encounter (Signed)
Please see previous message.  Her February prescription was at the pharmacy already.  Thanks

## 2022-06-22 NOTE — Progress Notes (Signed)
HPI: Follow-up atrial fibrillation.  Previously found to be in atrial fibrillation May 2023 in the setting of upper respiratory infection.  Troponin also noted to be elevated.  She underwent cardiac catheterization that revealed no coronary disease and normal LV function.  Echocardiogram showed normal LV function, grade 1 diastolic dysfunction, mild left atrial enlargement, moderate mitral regurgitation, mild to moderate tricuspid regurgitation, moderate aortic insufficiency.  Patient converted spontaneously to sinus rhythm.  Note patient's free T4 was elevated at time of hospitalization as well; FU normal.  Echocardiogram March 2024 showed normal LV function, grade 1 diastolic dysfunction, mild to moderate mitral regurgitation, mild aortic insufficiency.  Since last seen   Current Outpatient Medications  Medication Sig Dispense Refill   acetaminophen (TYLENOL) 650 MG CR tablet Take 1,300 mg by mouth every 8 (eight) hours as needed for pain.     albuterol (VENTOLIN HFA) 108 (90 Base) MCG/ACT inhaler INHALE 2 PUFFS INTO THE LUNGS EVERY 4 HOURS AS NEEDED FOR WHEEZING OR SHORTNESS OF BREATH 18 g 5   apixaban (ELIQUIS) 5 MG TABS tablet Take 1 tablet (5 mg total) by mouth 2 (two) times daily. 60 tablet 5   Ascorbic Acid (VITAMIN C PO) Take 1 tablet by mouth every morning.     benzonatate (TESSALON) 100 MG capsule Take 1 capsule (100 mg total) by mouth 3 (three) times daily as needed for cough. 20 capsule 0   Calcium Carb-Cholecalciferol (CALCIUM PLUS VITAMIN D3) 600-500 MG-UNIT CAPS Take 2 tablets by mouth every morning.     Cholecalciferol (VITAMIN D) 2000 units tablet Take 2,000 Units by mouth at bedtime.      cycloSPORINE (RESTASIS) 0.05 % ophthalmic emulsion Place 1 drop into both eyes every morning.     diazepam (VALIUM) 5 MG tablet Take 2.5 mg bid. Ok to take 2.5 mg in mid-day if needed. 30 tablet 3   diltiazem (CARDIZEM CD) 240 MG 24 hr capsule Take 1 capsule (240 mg total) by mouth every  morning. 90 capsule 3   fluticasone (FLONASE) 50 MCG/ACT nasal spray Place 2 sprays into both nostrils daily as needed for allergies or rhinitis. 16 g 5   irbesartan (AVAPRO) 150 MG tablet Take 1 tablet (150 mg total) by mouth daily. 30 tablet 11   metoprolol tartrate (LOPRESSOR) 50 MG tablet TAKE 1 TABLET(50 MG) BY MOUTH TWICE DAILY 60 tablet 11   mirabegron ER (MYRBETRIQ) 50 MG TB24 tablet Take 1 tablet (50 mg total) by mouth every morning. 30 tablet 5   Multiple Vitamin (MULTIVITAMIN WITH MINERALS) TABS tablet Take 1 tablet by mouth 2 (two) times daily.     naloxone (NARCAN) nasal spray 4 mg/0.1 mL 1 actuation in one nostril once. May repeat in 2-3 min 1 each 2   OVER THE COUNTER MEDICATION Take 2 capsules by mouth in the morning. [facility]     oxyCODONE-acetaminophen (PERCOCET/ROXICET) 5-325 MG tablet Take 1 tablet by mouth every 8 (eight) hours as needed for severe pain. 90 tablet 0   oxyCODONE-acetaminophen (PERCOCET/ROXICET) 5-325 MG tablet Take 1 tablet by mouth every 8 (eight) hours as needed for severe pain. 90 tablet 0   pantoprazole (PROTONIX) 40 MG tablet TAKE 1 TABLET BY MOUTH TWICE DAILY( BEFORE A MEAL) 60 tablet 11   PARoxetine (PAXIL) 40 MG tablet Take 1 tablet (40 mg total) by mouth 2 (two) times daily. 60 tablet 5   potassium chloride (KLOR-CON) 10 MEQ tablet Take 1 tablet (10 mEq total) by mouth daily. [place]  tablet 5   Probiotic CAPS Take 1 capsule by mouth every morning.     rosuvastatin (CRESTOR) 5 MG tablet Take 1 tablet (5 mg total) by mouth daily. 90 tablet 1   SUMAtriptan (IMITREX) 100 MG tablet May repeat in 2 hours if headache persists or recurs. 12 tablet 5   traZODone (DESYREL) 150 MG tablet Take 1 tablet (150 mg total) by mouth at bedtime as needed. for sleep 30 tablet 5   vitamin B-12 (CYANOCOBALAMIN) 1000 MCG tablet Take 1,000 mcg by mouth every morning.     No current facility-administered medications for this visit.     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   Hemorrhoids    History of colon polyps    History of endometriosis    and fibroids   History of hiatal hernia    History of UTI    Hypertension    Memory loss    Migraine    OSA (obstructive sleep apnea)    on nocturnal O2   Recurrent pneumonia 07/2018   Seasonal allergies    Vitamin D deficiency    Wears glasses     Past Surgical History:  Procedure Laterality Date   ABDOMINAL HYSTERECTOMY     BACK SURGERY     BIOPSY  10/20/2018   Procedure: BIOPSY;  Surgeon: [name], MD;  Location: [facility];  Service: Endoscopy;;   BLADDER SUSPENSION     COLONOSCOPY  02/16/2008   normal   DENTAL SURGERY     ESOPHAGOGASTRODUODENOSCOPY (EGD) WITH PROPOFOL N/A 10/20/2018   Procedure: ESOPHAGOGASTRODUODENOSCOPY (EGD) WITH PROPOFOL;  Surgeon: [name], MD;  Location: [facility];  Service: Endoscopy;  Laterality: N/A;   EUS N/A 10/20/2018   Procedure: UPPER ENDOSCOPIC ULTRASOUND (EUS) RADIAL;  Surgeon: [name], MD;  Location: [facility];  Service: Endoscopy;  Laterality: N/A;   FOOT SURGERY Left    x3   laparoscopy     for evaluation of endometriosis   LEFT HEART CATH AND CORONARY ANGIOGRAPHY N/A 09/04/2021   Procedure: LEFT HEART CATH AND CORONARY ANGIOGRAPHY;  Surgeon: Jettie Booze, MD;  Location: [facility];  Service: Cardiovascular;  Laterality: N/A;   TOTAL KNEE ARTHROPLASTY Left 07/15/2015   Procedure: TOTAL LEFT KNEE ARTHROPLASTY;  Surgeon: Paralee Cancel, MD;  Location: [facility];  Service: Orthopedics;  Laterality: Left;   UPPER GI ENDOSCOPY      Social History   Socioeconomic History   Marital status: Widowed    Spouse  name: Not on file   Number of children: 2   Years of education: 110   Highest education level: Not on file  Occupational History   Occupation: Futures trader - retired  Tobacco Use   Smoking status: Never   Smokeless tobacco: Never  Vaping Use   Vaping Use: Never used  Substance and Sexual Activity   Alcohol use: No    Alcohol/week: 0.0 standard drinks of alcohol    Comment: As of 07/2018, it is been several years since she drank any  alcohol.   Drug use: No   Sexual activity: Yes    Partners: Male    Birth control/protection: Surgical  Other Topics Concern   Not on file  Social History Narrative   HSG, [employer] BS interior design. Married 1977/07/18 - 55 years, her husband died in 07/18/1997.  She has been with her current, second husband since around 2008/07/18.  1 dtr - 1982/07/19 - lawyer, 1 son Jul 19, 1987. No h/o abuse.   Social Determinants of Health   Financial Resource Strain: Low Risk  (05/06/2022)   Overall Financial Resource Strain (CARDIA)    Difficulty of Paying Living Expenses: Not hard at all  Food Insecurity: No Food Insecurity (05/06/2022)   Hunger Vital Sign    Worried About Running Out of Food in the Last Year: Never true    Ran Out of Food in the Last Year: Never true  Transportation Needs: No Transportation Needs (05/06/2022)   PRAPARE - Hydrologist (Medical): No    Lack of Transportation (Non-Medical): No  Physical Activity: Sufficiently Active (05/06/2022)   Exercise Vital Sign    Days of Exercise per Week: 5 days    Minutes of Exercise per Session: 60 min  Stress: No Stress Concern Present (05/06/2022)   [place]ia    Feeling of Stress : Not at all  Social Connections: Moderately Integrated (05/06/2022)   Social Connection and Isolation Panel [NHANES]    Frequency of Communication with Friends and Family: More than three times a week    Frequency of Social Gatherings with Friends and Family: More  than three times a week    Attends Religious Services: More than 4 times per year    Active Member of [employer] or Organizations: Yes    Attends Archivist Meetings: More than 4 times per year    Marital Status: Widowed  Intimate Partner Violence: Not At Risk (05/06/2022)   Humiliation, Afraid, Rape, and Kick questionnaire    Fear of Current or Ex-Partner: No    Emotionally Abused: No    Physically Abused: No    Sexually Abused: No    Family History  Problem Relation Age of Onset   Emphysema Mother    Heart disease Mother 48       MI    ROS: no fevers or chills, productive cough, hemoptysis, dysphasia, odynophagia, melena, hematochezia, dysuria, hematuria, rash, seizure activity, orthopnea, PND, pedal edema, claudication. Remaining systems are negative.  Physical Exam: Well-developed well-nourished in no acute distress.  Skin is warm and dry.  HEENT is normal.  Neck is supple.  Chest is clear to auscultation with normal expansion.  Cardiovascular exam is regular rate and rhythm.  Abdominal exam nontender or distended. No masses palpated. Extremities show no edema. neuro grossly intact   A/P  1 paroxysmal atrial fibrillation-patient remains in sinus rhythm on exam.  Continue Cardizem, metoprolol and apixaban.  2 valvular heart disease-previous echocardiogram showed mild aortic and mild to moderate mitral regurgitation.  She remains asymptomatic.  Will plan follow-up echocardiogram 07/19/2023.  3 hypertension-blood pressure borderline; controlled at home.  Continue present medications and follow-up.  4 hyperlipidemia-continue statin.  Kirk Ruths, MD

## 2022-06-25 ENCOUNTER — Telehealth: Payer: Self-pay | Admitting: Internal Medicine

## 2022-06-25 MED ORDER — ROSUVASTATIN CALCIUM 5 MG PO TABS: 5.0000 mg | ORAL_TABLET | Freq: Every day | ORAL | 5 refills | Status: DC

## 2022-06-25 NOTE — Telephone Encounter (Signed)
MEDICATION:rosuvastatin (CRESTOR) 5 MG tablet   PHARMACY:[facility] UV:[id number] - [place], [place] - [address] AT [address]   Comments: Pharmacy said she needed to call the doctor's office for refill  **Let patient know to contact pharmacy at the end of the day to make sure medication is ready. **  ** Please notify patient to allow 48-72 hours to process**  **Encourage patient to contact the pharmacy for refills or they can request refills through [facility]**

## 2022-06-25 NOTE — Telephone Encounter (Signed)
Sent crestor to [facility]../l,mb

## 2022-06-29 MED ORDER — ROSUVASTATIN CALCIUM 5 MG PO TABS: 5.0000 mg | ORAL_TABLET | Freq: Every day | ORAL | 1 refills | Status: DC

## 2022-06-29 NOTE — Addendum Note (Signed)
Addended by: Earnstine Regal on: 06/29/2022 02:07 PM   Modules accepted: Orders

## 2022-06-29 NOTE — Telephone Encounter (Signed)
Rec'd fax pt requesting #[phone] instead. Sent 90 Rosuvastatin to [facility].Marland KitchenJohny Chess

## 2022-06-30 DIAGNOSIS — F411 Generalized anxiety disorder: Secondary | ICD-10-CM | POA: Diagnosis not present

## 2022-07-03 ENCOUNTER — Telehealth: Payer: Self-pay | Admitting: Internal Medicine

## 2022-07-03 ENCOUNTER — Ambulatory Visit (HOSPITAL_COMMUNITY): Payer: Medicare Other | Attending: Physician Assistant

## 2022-07-03 DIAGNOSIS — I340 Nonrheumatic mitral (valve) insufficiency: Secondary | ICD-10-CM

## 2022-07-03 LAB — ECHOCARDIOGRAM COMPLETE
Area-P 1/2: 3.87 cm2
P 1/2 time: 592 msec
S' Lateral: 2.2 cm

## 2022-07-03 NOTE — Telephone Encounter (Signed)
[employer] has clinical questions about some of the pt's prescriptions, requested to speak with clinical staff  Please call: [phone]

## 2022-07-06 ENCOUNTER — Ambulatory Visit: Payer: Medicare Other | Attending: Cardiology | Admitting: Cardiology

## 2022-07-06 ENCOUNTER — Encounter: Payer: Self-pay | Admitting: Cardiology

## 2022-07-06 VITALS — BP 140/80 | HR 63 | Ht 65.5 in | Wt 147.0 lb

## 2022-07-06 DIAGNOSIS — I10 Essential (primary) hypertension: Secondary | ICD-10-CM

## 2022-07-06 DIAGNOSIS — I340 Nonrheumatic mitral (valve) insufficiency: Secondary | ICD-10-CM

## 2022-07-06 DIAGNOSIS — I480 Paroxysmal atrial fibrillation: Secondary | ICD-10-CM | POA: Diagnosis not present

## 2022-07-06 NOTE — Patient Instructions (Signed)
    Follow-Up: At [facility], you and your health needs are our priority.  As part of our continuing mission to provide you with exceptional heart care, we have created designated Provider Care Teams.  These Care Teams include your primary Cardiologist (physician) and Advanced Practice Providers (APPs -  Physician Assistants and Nurse Practitioners) who all work together to provide you with the care you need, when you need it.  We recommend signing up for the patient portal called "MyChart".  Sign up information is provided on this After Visit Summary.  MyChart is used to connect with patients for Virtual Visits (Telemedicine).  Patients are able to view lab/test results, encounter notes, upcoming appointments, etc.  Non-urgent messages can be sent to your provider as well.   To learn more about what you can do with MyChart, go to [email or web address].    Your next appointment:   12 month(s)  Provider:   Brian Crenshaw, MD     

## 2022-07-06 NOTE — Telephone Encounter (Signed)
[employer] mailed form to MD place in MD purple folder../lm,b

## 2022-07-07 DIAGNOSIS — F411 Generalized anxiety disorder: Secondary | ICD-10-CM | POA: Diagnosis not present

## 2022-07-13 ENCOUNTER — Other Ambulatory Visit: Payer: Self-pay | Admitting: Internal Medicine

## 2022-07-13 DIAGNOSIS — Z96652 Presence of left artificial knee joint: Secondary | ICD-10-CM | POA: Diagnosis not present

## 2022-07-13 DIAGNOSIS — M5416 Radiculopathy, lumbar region: Secondary | ICD-10-CM

## 2022-07-13 DIAGNOSIS — M25562 Pain in left knee: Secondary | ICD-10-CM | POA: Diagnosis not present

## 2022-07-13 NOTE — Telephone Encounter (Signed)
Faxed back to [facility].Marland KitchenJohny Chess

## 2022-07-13 NOTE — Telephone Encounter (Signed)
Form was filled out and signed.  Thanks

## 2022-07-21 DIAGNOSIS — F411 Generalized anxiety disorder: Secondary | ICD-10-CM | POA: Diagnosis not present

## 2022-07-23 DIAGNOSIS — L282 Other prurigo: Secondary | ICD-10-CM | POA: Diagnosis not present

## 2022-07-23 DIAGNOSIS — A6004 Herpesviral vulvovaginitis: Secondary | ICD-10-CM | POA: Diagnosis not present

## 2022-07-24 ENCOUNTER — Encounter: Payer: Self-pay | Admitting: Family Medicine

## 2022-07-24 ENCOUNTER — Ambulatory Visit (INDEPENDENT_AMBULATORY_CARE_PROVIDER_SITE_OTHER): Payer: Medicare Other | Admitting: Family Medicine

## 2022-07-24 VITALS — BP 136/84 | HR 63 | Temp 97.8°F | Ht 65.5 in | Wt 147.0 lb

## 2022-07-24 DIAGNOSIS — L282 Other prurigo: Secondary | ICD-10-CM

## 2022-07-24 DIAGNOSIS — L245 Irritant contact dermatitis due to other chemical products: Secondary | ICD-10-CM

## 2022-07-24 MED ORDER — PREDNISONE 20 MG PO TABS: 40.0000 mg | ORAL_TABLET | Freq: Every day | ORAL | 0 refills | Status: DC

## 2022-07-24 NOTE — Progress Notes (Unsigned)
Subjective:     Patient ID: Bianca Haley, female    DOB: 14-May-1950, 72 y.o.   MRN: [id number]  Chief Complaint  Patient presents with   Rash    Facial rash, itchy spots slightly raised. Sometimes will break open and have white clear liquid. After they pop the skin will begin to peel. Used new facial powder once and immediatly stopped and started back on her normal facial powder. Rash has been going on and off for about a month. Would like soon dermatology referral if possible.     HPI Patient is in today for an intermittent pruritic rash on her face for the past month.   She ran out of her usual facial powder and she used a substitute from [place] with a moisturizer. She notices redness after using it one time.   She used to use facial wipes to clean her face and then switched to a facial wash.   No fever, chills. No rash elsewhere.     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   Hemorrhoids    History of colon polyps    History of endometriosis    and fibroids   History of hiatal hernia    History of UTI    Hypertension    Memory loss    Migraine    OSA (obstructive sleep apnea)    on nocturnal O2   Recurrent pneumonia 07/2018   Seasonal allergies    Vitamin D deficiency    Wears glasses     Past Surgical History:  Procedure Laterality Date   ABDOMINAL HYSTERECTOMY     BACK SURGERY     BIOPSY  10/20/2018   Procedure: BIOPSY;  Surgeon: [name], MD;  Location: [facility];  Service: Endoscopy;;   BLADDER  SUSPENSION     COLONOSCOPY  02/16/2008   normal   DENTAL SURGERY     ESOPHAGOGASTRODUODENOSCOPY (EGD) WITH PROPOFOL N/A 10/20/2018   Procedure: ESOPHAGOGASTRODUODENOSCOPY (EGD) WITH PROPOFOL;  Surgeon: [name], MD;  Location: [facility];  Service: Endoscopy;  Laterality: N/A;   EUS N/A 10/20/2018   Procedure: UPPER ENDOSCOPIC ULTRASOUND (EUS) RADIAL;  Surgeon: [name], MD;  Location: [facility];  Service: Endoscopy;  Laterality: N/A;   FOOT SURGERY Left    x3   laparoscopy     for evaluation of endometriosis   LEFT HEART CATH AND CORONARY ANGIOGRAPHY N/A 09/04/2021   Procedure: LEFT HEART CATH AND CORONARY ANGIOGRAPHY;  Surgeon: Jettie Booze, MD;  Location: [facility];  Service: Cardiovascular;  Laterality: N/A;   TOTAL KNEE ARTHROPLASTY Left 07/15/2015   Procedure: TOTAL LEFT KNEE ARTHROPLASTY;  Surgeon: Paralee Cancel, MD;  Location: [facility];  Service: Orthopedics;  Laterality: Left;   UPPER GI ENDOSCOPY      Family History  Problem Relation Age of Onset   Emphysema Mother    Heart disease  Mother 13       MI    Social History   Socioeconomic History   Marital status: Widowed    Spouse name: Not on file   Number of children: 2   Years of education: 36   Highest education level: Not on file  Occupational History   Occupation: Futures trader - retired  Tobacco Use   Smoking status: Never   Smokeless tobacco: Never  Vaping Use   Vaping Use: Never used  Substance and Sexual Activity   Alcohol use: No    Alcohol/week: 0.0 standard drinks of alcohol    Comment: As of 14-Aug-2018, it is been several years since she drank any alcohol.   Drug use: No   Sexual activity: Yes    Partners: Male    Birth control/protection: Surgical  Other Topics Concern   Not on file  Social History Narrative   HSG, [employer] BS interior design. Married 1977-08-13 - 75 years, her husband died in August 13, 1997.  She has been with her current, second husband since around Aug 13, 2008.  1 dtr -  08-14-1982 - lawyer, 1 son 08/14/87. No h/o abuse.   Social Determinants of Health   Financial Resource Strain: Low Risk  (05/06/2022)   Overall Financial Resource Strain (CARDIA)    Difficulty of Paying Living Expenses: Not hard at all  Food Insecurity: No Food Insecurity (05/06/2022)   Hunger Vital Sign    Worried About Running Out of Food in the Last Year: Never true    Ran Out of Food in the Last Year: Never true  Transportation Needs: No Transportation Needs (05/06/2022)   PRAPARE - Hydrologist (Medical): No    Lack of Transportation (Non-Medical): No  Physical Activity: Sufficiently Active (05/06/2022)   Exercise Vital Sign    Days of Exercise per Week: 5 days    Minutes of Exercise per Session: 60 min  Stress: No Stress Concern Present (05/06/2022)   [place]    Feeling of Stress : Not at all  Social Connections: Moderately Integrated (05/06/2022)   Social Connection and Isolation Panel [NHANES]    Frequency of Communication with Friends and Family: More than three times a week    Frequency of Social Gatherings with Friends and Family: More than three times a week    Attends Religious Services: More than 4 times per year    Active Member of [employer] or Organizations: Yes    Attends Archivist Meetings: More than 4 times per year    Marital Status: Widowed  Intimate Partner Violence: Not At Risk (05/06/2022)   Humiliation, Afraid, Rape, and Kick questionnaire    Fear of Current or Ex-Partner: No    Emotionally Abused: No    Physically Abused: No    Sexually Abused: No    Outpatient Medications Prior to Visit  Medication Sig Dispense Refill   acetaminophen (TYLENOL) 650 MG CR tablet Take 1,300 mg by mouth every 8 (eight) hours as needed for pain.     albuterol (VENTOLIN HFA) 108 (90 Base) MCG/ACT inhaler INHALE 2 PUFFS INTO THE LUNGS EVERY 4 HOURS AS NEEDED FOR WHEEZING OR SHORTNESS OF BREATH  18 g 5   apixaban (ELIQUIS) 5 MG TABS tablet Take 1 tablet (5 mg total) by mouth 2 (two) times daily. 60 tablet 5   Ascorbic Acid (VITAMIN C PO) Take 1 tablet by mouth every morning.     Calcium Carb-Cholecalciferol (CALCIUM  PLUS VITAMIN D3) 600-500 MG-UNIT CAPS Take 2 tablets by mouth every morning.     Cholecalciferol (VITAMIN D) 2000 units tablet Take 2,000 Units by mouth at bedtime.      cycloSPORINE (RESTASIS) 0.05 % ophthalmic emulsion Place 1 drop into both eyes every morning.     diazepam (VALIUM) 5 MG tablet Take 2.5 mg bid. Ok to take 2.5 mg in mid-day if needed. 30 tablet 3   diltiazem (CARDIZEM CD) 240 MG 24 hr capsule Take 1 capsule (240 mg total) by mouth every morning. 90 capsule 3   fluticasone (FLONASE) 50 MCG/ACT nasal spray Place 2 sprays into both nostrils daily as needed for allergies or rhinitis. 16 g 5   irbesartan (AVAPRO) 150 MG tablet Take 1 tablet (150 mg total) by mouth daily. 30 tablet 11   metoprolol tartrate (LOPRESSOR) 50 MG tablet TAKE 1 TABLET(50 MG) BY MOUTH TWICE DAILY 60 tablet 11   mirabegron ER (MYRBETRIQ) 50 MG TB24 tablet Take 1 tablet (50 mg total) by mouth every morning. 30 tablet 5   Multiple Vitamin (MULTIVITAMIN WITH MINERALS) TABS tablet Take 1 tablet by mouth 2 (two) times daily.     naloxone (NARCAN) nasal spray 4 mg/0.1 mL 1 actuation in one nostril once. May repeat in 2-3 min 1 each 2   OVER THE COUNTER MEDICATION Take 2 capsules by mouth in the morning. [facility]     oxyCODONE-acetaminophen (PERCOCET/ROXICET) 5-325 MG tablet Take 1 tablet by mouth every 8 (eight) hours as needed for severe pain. 90 tablet 0   oxyCODONE-acetaminophen (PERCOCET/ROXICET) 5-325 MG tablet Take 1 tablet by mouth every 8 (eight) hours as needed for severe pain. 90 tablet 0   pantoprazole (PROTONIX) 40 MG tablet TAKE 1 TABLET BY MOUTH TWICE DAILY( BEFORE A MEAL) 60 tablet 11   PARoxetine (PAXIL) 40 MG tablet Take 1 tablet (40 mg total) by mouth 2 (two) times daily. 60  tablet 5   potassium chloride (KLOR-CON) 10 MEQ tablet Take 1 tablet (10 mEq total) by mouth daily. 30 tablet 5   Probiotic CAPS Take 1 capsule by mouth every morning.     rosuvastatin (CRESTOR) 5 MG tablet Take 1 tablet (5 mg total) by mouth daily. 90 tablet 1   SUMAtriptan (IMITREX) 100 MG tablet May repeat in 2 hours if headache persists or recurs. 12 tablet 5   traZODone (DESYREL) 150 MG tablet Take 1 tablet (150 mg total) by mouth at bedtime as needed. for sleep 30 tablet 5   vitamin B-12 (CYANOCOBALAMIN) 1000 MCG tablet Take 1,000 mcg by mouth every morning.     benzonatate (TESSALON) 100 MG capsule Take 1 capsule (100 mg total) by mouth 3 (three) times daily as needed for cough. (Patient not taking: Reported on 07/24/2022) 20 capsule 0   No facility-administered medications prior to visit.    Allergies  Allergen Reactions   Penicillins Shortness Of Breath, Rash and Other (See Comments)    Did it involve swelling of the face/tongue/throat, SOB, or low BP? No Did it involve sudden or severe rash/hives, skin peeling, or any reaction on the inside of your mouth or nose? Yes Did you need to seek medical attention at a hospital or doctor's office? No When did it last happen?      10 + years If all above answers are "NO", may proceed with cephalosporin use.     Morphine And Related Swelling    Sedation/Swelling described like edema/bloating/doesn't help the pain Morphine only; tolerates other  opioids.    Morphine Other (See Comments)   Cefepime Rash    Reaction while taking both cefepime and vancomycin at [facility] (after 3 days)   Vancomycin Rash    Reaction while taking both cefepime and vancomycin at [facility] (after 3 days)    ROS     Objective:    Physical Exam  BP 136/84 (BP Location: Left Arm, Patient Position: Sitting, Cuff Size: Large)   Pulse 63   Temp 97.8 F (36.6 C) (Temporal)   Ht 5' 5.5" (1.664 m)   Wt 147 lb (66.7 kg)   SpO2 96%   BMI 24.09 kg/m  Wt Readings from  Last 3 Encounters:  07/24/22 147 lb (66.7 kg)  07/06/22 147 lb (66.7 kg)  05/12/22 143 lb (64.9 kg)   Alert and oriented and in no acute distress. Scattered erythema with papules and scabbing on face and forehead. No oropharyngeal or lip edema. Neck is supple without adenopathy. Cardiac exam shows a regular rhythm. Lungs are clear to auscultation.     Assessment & Plan:   Problem List Items Addressed This Visit   None Visit Diagnoses     Irritant contact dermatitis due to other chemical products    -  Primary   Relevant Medications   predniSONE (DELTASONE) 20 MG tablet   Other Relevant Orders   Ambulatory referral to Dermatology   Pruritic rash       Relevant Medications   predniSONE (DELTASONE) 20 MG tablet   Other Relevant Orders   Ambulatory referral to Dermatology      Oral steroids prescribed. Avoid using any make up, powders or soaps. May use OTC hydrocortisone for the next few days up to one week. Urgent referral to [facility].   I have discontinued Layci L. Gellerman "[name]"'s benzonatate. I am also having her start on predniSONE. Additionally, I am having her maintain her Vitamin D, multivitamin with minerals, Probiotic, acetaminophen, Calcium Plus Vitamin D3, OVER THE COUNTER MEDICATION, Ascorbic Acid (VITAMIN C PO), cycloSPORINE, cyanocobalamin, albuterol, SUMAtriptan, traZODone, naloxone, diltiazem, PARoxetine, oxyCODONE-acetaminophen, apixaban, mirabegron ER, potassium chloride, fluticasone, diazepam, irbesartan, pantoprazole, metoprolol tartrate, oxyCODONE-acetaminophen, and rosuvastatin.  Meds ordered this encounter  Medications   predniSONE (DELTASONE) 20 MG tablet    Sig: Take 2 tablets (40 mg total) by mouth daily with breakfast.    Dispense:  10 tablet    Refill:  0    Order Specific Question:   Supervising Provider    Answer:   Pricilla Holm A [id number]

## 2022-07-24 NOTE — Patient Instructions (Signed)
Take the prednisone starting tomorrow morning with breakfast.  You may use over-the-counter hydrocortisone but nothing stronger on your face.  Do not use this more than 1 week.  Take over-the-counter Zyrtec twice daily for the next week.  You should hear from [facility] health dermatology to schedule a visit.  Avoid make-ups or anything on your face.  Use cool compresses as needed for itching.

## 2022-08-11 ENCOUNTER — Ambulatory Visit: Payer: Medicare Other | Admitting: Dermatology

## 2022-08-11 ENCOUNTER — Encounter: Payer: Self-pay | Admitting: Dermatology

## 2022-08-11 VITALS — BP 132/82 | HR 60

## 2022-08-11 DIAGNOSIS — L243 Irritant contact dermatitis due to cosmetics: Secondary | ICD-10-CM | POA: Diagnosis not present

## 2022-08-11 DIAGNOSIS — F411 Generalized anxiety disorder: Secondary | ICD-10-CM | POA: Diagnosis not present

## 2022-08-11 MED ORDER — HYDROCORTISONE 2.5 % EX CREA: TOPICAL_CREAM | CUTANEOUS | 2 refills | Status: DC

## 2022-08-11 NOTE — Patient Instructions (Addendum)
Start Hydrocortisone 2.5% cream twice daily up to 1 week as needed for rash on face.  Topical steroids (such as triamcinolone, fluocinolone, fluocinonide, mometasone, clobetasol, halobetasol, betamethasone, hydrocortisone) can cause thinning and lightening of the skin if they are used for too long in the same area. Your physician has selected the right strength medicine for your problem and area affected on the body. Please use your medication only as directed by your physician to prevent side effects.     Recommend MAC Makeup.    Due to recent changes in healthcare laws, you may see results of your pathology and/or laboratory studies on MyChart before the doctors have had a chance to review them. We understand that in some cases there may be results that are confusing or concerning to you. Please understand that not all results are received at the same time and often the doctors may need to interpret multiple results in order to provide you with the best plan of care or course of treatment. Therefore, we ask that you please give [place] 2 business days to thoroughly review all your results before contacting the office for clarification. Should we see a critical lab result, you will be contacted sooner.   If You Need Anything After Your Visit  If you have any questions or concerns for your doctor, please call our main line at [phone] If no one answers, please leave a voicemail as directed and we will return your call as soon as possible. Messages left after 4 pm will be answered the following business day.   You may also send [place] a message via MyChart. We typically respond to MyChart messages within 1-2 business days.  For prescription refills, please ask your pharmacy to contact our office. Our fax number is [phone].  If you have an urgent issue when the clinic is closed that cannot wait until the next business day, you can page your doctor at the number below.    Please note that while  we do our best to be available for urgent issues outside of office hours, we are not available 24/7.   If you have an urgent issue and are unable to reach [place], you may choose to seek medical care at your doctor's office, retail clinic, urgent care center, or emergency room.  If you have a medical emergency, please immediately call 911 or go to the emergency department. In the event of inclement weather, please call our main line at [phone] for an update on the status of any delays or closures.  Dermatology Medication Tips: Please keep the boxes that topical medications come in in order to help keep track of the instructions about where and how to use these. Pharmacies typically print the medication instructions only on the boxes and not directly on the medication tubes.   If your medication is too expensive, please contact our office at [phone] or send [place] a message through MyChart.   We are unable to tell what your co-pay for medications will be in advance as this is different depending on your insurance coverage. However, we may be able to find a substitute medication at lower cost or fill out paperwork to get insurance to cover a needed medication.   If a prior authorization is required to get your medication covered by your insurance company, please allow [place] 1-2 business days to complete this process.  Drug prices often vary depending on where the prescription is filled and some pharmacies may offer cheaper prices.  The website  www.goodrx.com contains coupons for medications through different pharmacies. The prices here do not account for what the cost may be with help from insurance (it may be cheaper with your insurance), but the website can give you the price if you did not use any insurance.  - You can print the associated coupon and take it with your prescription to the pharmacy.  - You may also stop by our office during regular business hours and pick up a GoodRx coupon card.   - If you need your prescription sent electronically to a different pharmacy, notify our office through [facility] or by phone at [phone]

## 2022-08-11 NOTE — Progress Notes (Unsigned)
   New Patient Visit   Subjective  Bianca Haley is a 72 y.o. female who presents for the following: She is concerned of history of itchy raised splotches x 3 weeks. She says they were filled with water which broke open. She did not treat them with anything. She had used a new cosmetic product. She has stopped using make up since and is using gentle cleanser and Clinique moisturizer. She is also using Neutrogena 70 SPF.  PCP gave oral Prednisone and OTC hydrocortisone cream.   The following portions of the chart were reviewed this encounter and updated as appropriate: medications, allergies, medical history  Review of Systems:  No other skin or systemic complaints except as noted in HPI or Assessment and Plan.  Objective  Well appearing patient in no apparent distress; mood and affect are within normal limits.   A focused examination was performed of the following areas: face   Relevant exam findings are noted in the Assessment and Plan.    Assessment & Plan   IRRITANT CONTACT DERMATITIS Exam: No active lesions today.  Treatment Plan: -STOP using [employer]metics compact, Recommended MAC compact and  -Start Hydrocortisone 2.5% cream twice daily up to 1 week as needed for rash on face, if it recurs. -Continue gentle skincare  Medication Counseling: Topical steroids (such as triamcinolone, fluocinolone, fluocinonide, mometasone, clobetasol, halobetasol, betamethasone, hydrocortisone) can cause thinning and lightening of the skin if they are used for too long in the same area. Your physician has selected the right strength medicine for your problem and area affected on the body. Please use your medication only as directed by your physician to prevent side effects.          Return if symptoms worsen or fail to improve.  Jaclynn Guarneri, CMA, am acting as scribe for Langston Reusing, MD.   Documentation: I have reviewed the above documentation for accuracy and completeness, and  I agree with the above.  Langston Reusing, MD

## 2022-08-12 ENCOUNTER — Ambulatory Visit (INDEPENDENT_AMBULATORY_CARE_PROVIDER_SITE_OTHER): Payer: Medicare Other | Admitting: Internal Medicine

## 2022-08-12 ENCOUNTER — Encounter: Payer: Self-pay | Admitting: Internal Medicine

## 2022-08-12 VITALS — BP 132/68 | HR 74 | Temp 97.9°F | Ht 65.5 in | Wt 147.0 lb

## 2022-08-12 DIAGNOSIS — M5441 Lumbago with sciatica, right side: Secondary | ICD-10-CM

## 2022-08-12 DIAGNOSIS — I10 Essential (primary) hypertension: Secondary | ICD-10-CM

## 2022-08-12 DIAGNOSIS — M25562 Pain in left knee: Secondary | ICD-10-CM | POA: Diagnosis not present

## 2022-08-12 DIAGNOSIS — I480 Paroxysmal atrial fibrillation: Secondary | ICD-10-CM | POA: Diagnosis not present

## 2022-08-12 DIAGNOSIS — G8929 Other chronic pain: Secondary | ICD-10-CM | POA: Insufficient documentation

## 2022-08-12 DIAGNOSIS — M5442 Lumbago with sciatica, left side: Secondary | ICD-10-CM | POA: Diagnosis not present

## 2022-08-12 DIAGNOSIS — M545 Low back pain, unspecified: Secondary | ICD-10-CM | POA: Insufficient documentation

## 2022-08-12 MED ORDER — OXYCODONE-ACETAMINOPHEN 5-325 MG PO TABS: 1.0000 | ORAL_TABLET | Freq: Three times a day (TID) | ORAL | 0 refills | Status: DC | PRN

## 2022-08-12 NOTE — Assessment & Plan Note (Signed)
Worse Blue-Emu cream was recommended to use 2-3 times a day Elastic brace

## 2022-08-12 NOTE — Patient Instructions (Signed)
Blue-Emu cream use 2-3 times a day  Vit D3+K2

## 2022-08-12 NOTE — Assessment & Plan Note (Signed)
Stable. Cont w/Atenolol, Diltiazem,  Maxzide ?

## 2022-08-12 NOTE — Progress Notes (Signed)
Subjective:  Patient ID: Bianca Haley, female    DOB: [birth year]  Age: 72 y.o. MRN: [id number]  CC: Medical Management of Chronic Issues (3 month follow up, want to talk about left knee surrounding area is hurting ( had a knee replacement on it))   HPI Bianca Haley presents for chronic pain, PAF, anxiety C/o L knee pain C/o rash on face due to a new make up  Outpatient Medications Prior to Visit  Medication Sig Dispense Refill   acetaminophen (TYLENOL) 650 MG CR tablet Take 1,300 mg by mouth every 8 (eight) hours as needed for pain.     albuterol (VENTOLIN HFA) 108 (90 Base) MCG/ACT inhaler INHALE 2 PUFFS INTO THE LUNGS EVERY 4 HOURS AS NEEDED FOR WHEEZING OR SHORTNESS OF BREATH 18 g 5   apixaban (ELIQUIS) 5 MG TABS tablet Take 1 tablet (5 mg total) by mouth 2 (two) times daily. 60 tablet 5   Ascorbic Acid (VITAMIN C PO) Take 1 tablet by mouth every morning.     Calcium Carb-Cholecalciferol (CALCIUM PLUS VITAMIN D3) 600-500 MG-UNIT CAPS Take 2 tablets by mouth every morning.     Cholecalciferol (VITAMIN D) 2000 units tablet Take 2,000 Units by mouth at bedtime.      cycloSPORINE (RESTASIS) 0.05 % ophthalmic emulsion Place 1 drop into both eyes every morning.     diazepam (VALIUM) 5 MG tablet Take 2.5 mg bid. Ok to take 2.5 mg in mid-day if needed. 30 tablet 3   diltiazem (CARDIZEM CD) 240 MG 24 hr capsule Take 1 capsule (240 mg total) by mouth every morning. 90 capsule 3   fluticasone (FLONASE) 50 MCG/ACT nasal spray Place 2 sprays into both nostrils daily as needed for allergies or rhinitis. 16 g 5   hydrocortisone 2.5 % cream Apply twice daily for up to one week as needed for flares 30 g 2   irbesartan (AVAPRO) 150 MG tablet Take 1 tablet (150 mg total) by mouth daily. 30 tablet 11   metoprolol tartrate (LOPRESSOR) 50 MG tablet TAKE 1 TABLET(50 MG) BY MOUTH TWICE DAILY 60 tablet 11   mirabegron ER (MYRBETRIQ) 50 MG TB24 tablet Take 1 tablet (50 mg total) by mouth every morning. 30  tablet 5   Multiple Vitamin (MULTIVITAMIN WITH MINERALS) TABS tablet Take 1 tablet by mouth 2 (two) times daily.     naloxone (NARCAN) nasal spray 4 mg/0.1 mL 1 actuation in one nostril once. May repeat in 2-3 min 1 each 2   OVER THE COUNTER MEDICATION Take 2 capsules by mouth in the morning. [place]     pantoprazole (PROTONIX) 40 MG tablet TAKE 1 TABLET BY MOUTH TWICE DAILY( BEFORE A MEAL) 60 tablet 11   PARoxetine (PAXIL) 40 MG tablet Take 1 tablet (40 mg total) by mouth 2 (two) times daily. 60 tablet 5   potassium chloride (KLOR-CON) 10 MEQ tablet Take 1 tablet (10 mEq total) by mouth daily. 30 tablet 5   predniSONE (DELTASONE) 20 MG tablet Take 2 tablets (40 mg total) by mouth daily with breakfast. 10 tablet 0   Probiotic CAPS Take 1 capsule by mouth every morning.     rosuvastatin (CRESTOR) 5 MG tablet Take 1 tablet (5 mg total) by mouth daily. 90 tablet 1   SUMAtriptan (IMITREX) 100 MG tablet May repeat in 2 hours if headache persists or recurs. 12 tablet 5   traZODone (DESYREL) 150 MG tablet Take 1 tablet (150 mg total) by mouth at bedtime as needed.  for sleep 30 tablet 5   vitamin B-12 (CYANOCOBALAMIN) 1000 MCG tablet Take 1,000 mcg by mouth every morning.     oxyCODONE-acetaminophen (PERCOCET/ROXICET) 5-325 MG tablet Take 1 tablet by mouth every 8 (eight) hours as needed for severe pain. 90 tablet 0   oxyCODONE-acetaminophen (PERCOCET/ROXICET) 5-325 MG tablet Take 1 tablet by mouth every 8 (eight) hours as needed for severe pain. 90 tablet 0   No facility-administered medications prior to visit.    ROS: Review of Systems  Constitutional:  Negative for activity change, appetite change, chills, fatigue and unexpected weight change.  HENT:  Negative for congestion, mouth sores and sinus pressure.   Eyes:  Negative for visual disturbance.  Respiratory:  Negative for cough and chest tightness.   Gastrointestinal:  Negative for abdominal pain and nausea.  Genitourinary:  Negative for  difficulty urinating, frequency and vaginal pain.  Musculoskeletal:  Positive for arthralgias and back pain. Negative for gait problem.  Skin:  Positive for rash. Negative for pallor.  Neurological:  Negative for dizziness, tremors, weakness, numbness and headaches.  Psychiatric/Behavioral:  Positive for decreased concentration. Negative for confusion, sleep disturbance and suicidal ideas.     Objective:  BP 132/68   Pulse 74   Temp 97.9 F (36.6 C) (Temporal)   Ht 5' 5.5" (1.664 m)   Wt 147 lb (66.7 kg)   SpO2 93%   BMI 24.09 kg/m   BP Readings from Last 3 Encounters:  08/12/22 132/68  08/11/22 132/82  07/24/22 136/84    Wt Readings from Last 3 Encounters:  08/12/22 147 lb (66.7 kg)  07/24/22 147 lb (66.7 kg)  07/06/22 147 lb (66.7 kg)    Physical Exam Constitutional:      General: She is not in acute distress.    Appearance: Normal appearance. She is well-developed.  HENT:     Head: Normocephalic.     Right Ear: External ear normal.     Left Ear: External ear normal.     Nose: Nose normal.  Eyes:     General:        Right eye: No discharge.        Left eye: No discharge.     Conjunctiva/sclera: Conjunctivae normal.     Pupils: Pupils are equal, round, and reactive to light.  Neck:     Thyroid: No thyromegaly.     Vascular: No JVD.     Trachea: No tracheal deviation.  Cardiovascular:     Rate and Rhythm: Normal rate and regular rhythm.     Heart sounds: Normal heart sounds.  Pulmonary:     Effort: No respiratory distress.     Breath sounds: No stridor. No wheezing.  Abdominal:     General: Bowel sounds are normal. There is no distension.     Palpations: Abdomen is soft. There is no mass.     Tenderness: There is no abdominal tenderness. There is no guarding or rebound.  Musculoskeletal:        General: Tenderness present.     Cervical back: Normal range of motion and neck supple. No rigidity.  Lymphadenopathy:     Cervical: No cervical adenopathy.   Skin:    Findings: Rash present. No erythema.  Neurological:     Cranial Nerves: No cranial nerve deficit.     Motor: No abnormal muscle tone.     Coordination: Coordination normal.     Gait: Gait abnormal.     Deep Tendon Reflexes: Reflexes normal.  Psychiatric:  Behavior: Behavior normal.        Thought Content: Thought content normal.        Judgment: Judgment normal.     Lab Results  Component Value Date   WBC 5.4 10/08/2021   HGB 14.5 10/08/2021   HCT 42.3 10/08/2021   PLT 256 10/08/2021   GLUCOSE 90 03/11/2022   CHOL 155 04/03/2020   TRIG 102.0 04/03/2020   HDL 61.40 04/03/2020   LDLCALC 73 04/03/2020   ALT 9 03/11/2022   AST 20 03/11/2022   NA 140 03/11/2022   K 4.3 03/11/2022   CL 100 03/11/2022   CREATININE 0.80 03/11/2022   BUN 21 03/11/2022   CO2 36 (H) 03/11/2022   TSH 1.43 09/18/2021   INR 1.0 09/21/2018   HGBA1C 6.1 06/23/2018    CT Angio Chest Pulmonary Embolism (PE) W or WO Contrast  Result Date: 10/05/2021 CLINICAL DATA:  Chest pain or SOB, pleurisy or effusion suspected EXAM: CT ANGIOGRAPHY CHEST WITH CONTRAST TECHNIQUE: Multidetector CT imaging of the chest was performed using the standard protocol during bolus administration of intravenous contrast. Multiplanar CT image reconstructions and MIPs were obtained to evaluate the vascular anatomy. RADIATION DOSE REDUCTION: This exam was performed according to the departmental dose-optimization program which includes automated exposure control, adjustment of the mA and/or kV according to patient size and/or use of iterative reconstruction technique. CONTRAST:  77mL OMNIPAQUE IOHEXOL 350 MG/ML SOLN COMPARISON:  CT dated March 10, 2021 FINDINGS: Cardiovascular: Evaluation of the bases is limited secondary to respiratory motion. No acute pulmonary embolism through the segmental pulmonary arteries. Heart is at the upper limits of normal. Scattered soft and calcified atherosclerotic plaque throughout the  course of the nonaneurysmal thoracic aorta. No pericardial effusion. Mediastinum/Nodes: No axillary adenopathy. Visualized thyroid is unremarkable. Increase in size of multiple mediastinal lymph nodes. Representative pretracheal lymph node measures d 13 mm in the short axis, previously 6 mm (series 6, image 79). Additional representative pretracheal lymph node measures 15 mm in the short axis, previously 10 mm (series 6, image 108). RIGHT hilar lymph node measures 14 mm in the short axis (series 6, image 141). Lungs/Pleura: Trace RIGHT pleural effusion. There is bronchial wall thickening. Multifocal endobronchial debris is noted. Hypoenhancing centrilobular consolidative opacities most predominately within the bases. Scattered ground-glass opacities are noted in the apices. There is a likely background of mosaic attenuation, likely reflecting air trapping. Several consolidative opacities are more nodular in appearance with representative consolidative opacity versus nodule measuring 6 mm (series 6, image 170). Upper Abdomen: Small hiatal hernia. Mildly nodular contour of the liver with hypertrophy of the LEFT liver could reflect underlying cirrhosis Musculoskeletal: Degenerative changes of the thoracic spine. Review of the MIP images confirms the above findings. IMPRESSION: 1. No acute pulmonary embolism. 2. There is bronchial wall thickening with endobronchial debris and multifocal hypoenhancing consolidative opacities. Findings likely reflect infection versus aspiration. Given nodular appearance of several opacities, recommend follow-up CT in 3 months to assess for appropriate resolution. 3. Interval increase in size of multiple mediastinal lymph nodes. This is likely reactive in etiology. Recommend attention on CT. 4. Trace RIGHT pleural effusion. 5. Morphologic changes of the liver which could reflect underlying cirrhosis. Aortic Atherosclerosis (ICD10-I70.0). Electronically Signed   By: Meda Klinefelter M.D.    On: 10/05/2021 15:36   CT Head Wo Contrast  Result Date: 10/04/2021 CLINICAL DATA:  Mental status change, unknown cause unresponsive at home - recently started on A/C - evaluate for ICH EXAM: CT HEAD WITHOUT  CONTRAST TECHNIQUE: Contiguous axial images were obtained from the base of the skull through the vertex without intravenous contrast. RADIATION DOSE REDUCTION: This exam was performed according to the departmental dose-optimization program which includes automated exposure control, adjustment of the mA and/or kV according to patient size and/or use of iterative reconstruction technique. COMPARISON:  MRI head 12/28/2017 report without imaging, CT head 07/25/2020 BRAIN: BRAIN No evidence of large-territorial acute infarction. No parenchymal hemorrhage. No mass lesion. No extra-axial collection. No mass effect or midline shift. No hydrocephalus. Basilar cisterns are patent. Vascular: No hyperdense vessel. Skull: No acute fracture or focal lesion. Limited evaluation for skull base fracture due to motion artifact. The Sinuses/Orbits: Paranasal sinuses and mastoid air cells are clear. Bilateral lens replacement. The orbits are unremarkable. Other: None. IMPRESSION: No acute intracranial abnormality. Electronically Signed   By: Tish Frederickson M.D.   On: 10/04/2021 17:17   DG Chest Portable 1 View  Result Date: 10/04/2021 CLINICAL DATA:  Altered mental status.  Possible aspiration. EXAM: PORTABLE CHEST 1 VIEW COMPARISON:  09/02/2021 and prior radiographs FINDINGS: UPPER limits normal heart size and peribronchial thickening again noted. There is no evidence of focal airspace disease, pulmonary edema, suspicious pulmonary nodule/mass, pleural effusion, or pneumothorax. No acute bony abnormalities are identified. IMPRESSION: 1. No evidence of acute cardiopulmonary disease. 2. Chronic peribronchial thickening. Electronically Signed   By: Harmon Pier M.D.   On: 10/04/2021 16:26    Assessment & Plan:   Problem  List Items Addressed This Visit       Cardiovascular and Mediastinum   Essential hypertension - Primary    Stable. Cont w/Atenolol, Diltiazem,  Maxzide      Paroxysmal atrial fibrillation    Cont on Eliquis        Other   Knee pain, left    Worse Blue-Emu cream was recommended to use 2-3 times a day Elastic brace       Low back pain    Chronic Percocet prn  Potential benefits of a long term opioids use as well as potential risks (i.e. addiction risk, apnea etc) and complications (i.e. Somnolence, constipation and others) were explained to the patient and were aknowledged.      Relevant Medications   oxyCODONE-acetaminophen (PERCOCET/ROXICET) 5-325 MG tablet   oxyCODONE-acetaminophen (PERCOCET/ROXICET) 5-325 MG tablet      Meds ordered this encounter  Medications   oxyCODONE-acetaminophen (PERCOCET/ROXICET) 5-325 MG tablet    Sig: Take 1 tablet by mouth every 8 (eight) hours as needed for severe pain.    Dispense:  90 tablet    Refill:  0    Please fill on or after 08/21/22   oxyCODONE-acetaminophen (PERCOCET/ROXICET) 5-325 MG tablet    Sig: Take 1 tablet by mouth every 8 (eight) hours as needed for severe pain.    Dispense:  90 tablet    Refill:  0    Please fill on or after 09/20/22      Follow-up: Return in about 3 months (around 11/11/2022) for a follow-up visit.  Sonda Primes, MD

## 2022-08-12 NOTE — Assessment & Plan Note (Signed)
Chronic  Percocet prn  Potential benefits of a long term opioids use as well as potential risks (i.e. addiction risk, apnea etc) and complications (i.e. Somnolence, constipation and others) were explained to the patient and were aknowledged. 

## 2022-08-12 NOTE — Assessment & Plan Note (Signed)
Cont on Eliquis- 

## 2022-08-18 ENCOUNTER — Ambulatory Visit (INDEPENDENT_AMBULATORY_CARE_PROVIDER_SITE_OTHER): Payer: Medicare Other | Admitting: Adult Health

## 2022-08-18 ENCOUNTER — Encounter: Payer: Self-pay | Admitting: Adult Health

## 2022-08-18 VITALS — BP 120/78 | HR 58 | Temp 98.3°F | Ht 65.0 in | Wt 148.0 lb

## 2022-08-18 DIAGNOSIS — M5442 Lumbago with sciatica, left side: Secondary | ICD-10-CM | POA: Diagnosis not present

## 2022-08-18 DIAGNOSIS — G8929 Other chronic pain: Secondary | ICD-10-CM

## 2022-08-18 DIAGNOSIS — G4734 Idiopathic sleep related nonobstructive alveolar hypoventilation: Secondary | ICD-10-CM | POA: Diagnosis not present

## 2022-08-18 DIAGNOSIS — J9611 Chronic respiratory failure with hypoxia: Secondary | ICD-10-CM

## 2022-08-18 DIAGNOSIS — J189 Pneumonia, unspecified organism: Secondary | ICD-10-CM | POA: Diagnosis not present

## 2022-08-18 DIAGNOSIS — M5441 Lumbago with sciatica, right side: Secondary | ICD-10-CM

## 2022-08-18 NOTE — Patient Instructions (Addendum)
Set up overnight oximetry test at home on room air. (No oxygen for test ) .  Continue on 1l/m Oxygen At bedtime  .  CT chest without contrast  Albuterol inhaler As needed   Activity as tolerated.  Follow up with Dr. Vassie Loll  in 1 year and As needed

## 2022-08-18 NOTE — Progress Notes (Unsigned)
  ID: Bianca Haley, female    DOB: 1950-09-19, 71 y.o.   MRN: [id number]  Chief Complaint  Patient presents with   Follow-up    Referring provider: Tresa Garter, MD  HPI: 72 year old female never smoker followed for recurrent pneumonia felt secondary to aspiration with known chronic narcotic use and esophageal dysmotility and chronic respiratory failure with nocturnal hypoxemia on oxygen at bedtime Chronic back pain/neck   TESTS/EVENTS :  ONO 11/2019 ONO showed desatn < 88% through the night Started on 1 L O2 during sleep   NPSG 01/2019 TST 328 minutes , weight 157 pounds, AHI 1.5/hour. Oxygen saturation less than 89% for 84% of the time, corrected by 1 L oxygen   CT angio 07/2018 Patchy areas of very subtle ground-glass attenuation are noted throughout the lungs bilaterally    02/2013 CT chest showed RML/RLL pneumonia and what appeared to be some progression of bronchiectasis (first noted 2011)      Esophagram was negative. IgG,IgaA and IgM levels were low, but not immunosuppressed.     09/2013  admitted for recurrent PNA /aspiration pneumonitis . CXR showed R>L consolidation c/w multifocal PNA.   MBS >> dysphagia but raised concerns of possible primary esophageal dysmotility as barium pill stopped mid esophagus requiring additional thin barium to facilitate transit to stomach , PPI to BID prior to discharge.       04/2014 EGD showed mild esophagitis.    2015 BL severe pneumonia when visiting son in [place]   CT chest November 2022 stable scarring right middle lobe and left lower lobe and lingula similar to 2020.  CT chest October 05, 2021 negative PE bronchial wall thickening and endobronchial debris with multifocal consolidative opacities.  Concerning for possible aspiration, multiple mediastinal lymph nodes, trace pleural effusion on the right   08/18/2022 Follow up: Chronic respiratory failure on Nocturnal O2  Patient returns for a follow-up visit.  Last seen  May 06, 2020.  She has a history of recurrent pneumonia and chronic respiratory failure on oxygen.  Patient says since last visit she feels that she has been doing pretty well.  She did get hospitalized for pneumonia in June 2023.  But has been doing good since then.  Hospitalization in June 2023 was unintentional oversedation with narcotics and polypharmacy.  Patient says since then she has been trying to use less sedating medications.  She is currently on oxycodone 5 mg 3 times daily, trazodone 150 mg at bedtime and Valium 2.5 mg twice daily.  She says she is remaining very active.  She does chair yoga and line dancing.  Her husband did pass away about a year ago.  She says that she has been dealing with this over the last year. Patient denies any hemoptysis, chest pain, orthopnea, edema. She remains on oxygen 1 L at bedtime.  Patient is required to have an overnight oximetry test for insurance qualification.  Has rare use of her albuterol inhaler.   Allergies  Allergen Reactions   Penicillins Shortness Of Breath, Rash and Other (See Comments)    Did it involve swelling of the face/tongue/throat, SOB, or low BP? No Did it involve sudden or severe rash/hives, skin peeling, or any reaction on the inside of your mouth or nose? Yes Did you need to seek medical attention at a hospital or doctor's office? No When did it last happen?      10 + years If all above answers are "NO", may proceed with cephalosporin use.  Morphine And Related Swelling    Sedation/Swelling described like edema/bloating/doesn't help the pain Morphine only; tolerates other opioids.    Morphine Other (See Comments)   Cefepime Rash    Reaction while taking both cefepime and vancomycin at [facility] (after 3 days)   Vancomycin Rash    Reaction while taking both cefepime and vancomycin at [facility] (after 3 days)    Immunization History  Administered Date(s) Administered   Fluad Quad(high Dose 65+) 01/30/2019, 04/03/2020,  02/07/2021, 03/11/2022   Influenza Split 02/27/2015   Influenza Whole 01/30/2009   Influenza, High Dose Seasonal PF 02/08/2017, 01/04/2018   Influenza, Seasonal, Injecte, Preservative Fre 02/27/2015   Influenza,inj,Quad PF,6+ Mos 01/26/2013, 01/26/2014, 02/21/2016   Influenza-Unspecified 01/26/2013, 01/26/2014, 02/21/2016, 02/09/2017   Moderna SARS-COV2 Booster Vaccination 04/05/2020   Moderna Sars-Covid-2 Vaccination 05/25/2019, 06/22/2019   Pneumococcal Conjugate-13 01/26/2013   Pneumococcal Polysaccharide-23 03/23/2013, 04/03/2020   Td 01/30/2009   Td (Adult), 2 Lf Tetanus Toxid, Preservative Free 01/30/2009   Tdap 06/23/2018   Zoster Recombinat (Shingrix) 02/07/2018    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   Hemorrhoids    History of colon polyps    History of endometriosis    and fibroids   History of hiatal hernia    History of UTI    Hypertension    Memory loss    Migraine    OSA (obstructive sleep apnea)    on nocturnal O2   Recurrent pneumonia 07/2018   Seasonal allergies    Vitamin D deficiency    Wears glasses     Tobacco History: Social History   Tobacco Use  Smoking Status Never  Smokeless Tobacco Never   Counseling given: Not Answered   Outpatient Medications Prior to Visit  Medication Sig Dispense Refill   acetaminophen (TYLENOL) 650 MG CR tablet Take 1,300 mg by mouth every 8 (eight) hours as needed for pain.     albuterol (VENTOLIN HFA) 108 (90 Base) MCG/ACT inhaler INHALE 2 PUFFS INTO THE LUNGS EVERY 4 HOURS AS NEEDED FOR WHEEZING OR SHORTNESS OF BREATH 18 g 5   apixaban (ELIQUIS) 5 MG TABS tablet Take 1 tablet (5 mg total) by  mouth 2 (two) times daily. 60 tablet 5   Ascorbic Acid (VITAMIN C PO) Take 1 tablet by mouth every morning.     Calcium Carb-Cholecalciferol (CALCIUM PLUS VITAMIN D3) 600-500 MG-UNIT CAPS Take 2 tablets by mouth every morning.     Cholecalciferol (VITAMIN D) 2000 units tablet Take 2,000 Units by mouth at bedtime.      cycloSPORINE (RESTASIS) 0.05 % ophthalmic emulsion Place 1 drop into both eyes every morning.     diazepam (VALIUM) 5 MG tablet Take 2.5 mg bid. Ok to take 2.5 mg in mid-day if needed. 30 tablet 3   diltiazem (CARDIZEM CD) 240 MG 24 hr capsule Take 1 capsule (240 mg total) by mouth every morning. 90 capsule 3   fluticasone (FLONASE) 50 MCG/ACT nasal spray Place 2 sprays into both nostrils daily as needed for allergies or rhinitis. 16 g 5  hydrocortisone 2.5 % cream Apply twice daily for up to one week as needed for flares 30 g 2   irbesartan (AVAPRO) 150 MG tablet Take 1 tablet (150 mg total) by mouth daily. 30 tablet 11   metoprolol tartrate (LOPRESSOR) 50 MG tablet TAKE 1 TABLET(50 MG) BY MOUTH TWICE DAILY 60 tablet 11   mirabegron ER (MYRBETRIQ) 50 MG TB24 tablet Take 1 tablet (50 mg total) by mouth every morning. 30 tablet 5   Multiple Vitamin (MULTIVITAMIN WITH MINERALS) TABS tablet Take 1 tablet by mouth 2 (two) times daily.     naloxone (NARCAN) nasal spray 4 mg/0.1 mL 1 actuation in one nostril once. May repeat in 2-3 min 1 each 2   OVER THE COUNTER MEDICATION Take 2 capsules by mouth in the morning. [facility]     oxyCODONE-acetaminophen (PERCOCET/ROXICET) 5-325 MG tablet Take 1 tablet by mouth every 8 (eight) hours as needed for severe pain. 90 tablet 0   oxyCODONE-acetaminophen (PERCOCET/ROXICET) 5-325 MG tablet Take 1 tablet by mouth every 8 (eight) hours as needed for severe pain. 90 tablet 0   pantoprazole (PROTONIX) 40 MG tablet TAKE 1 TABLET BY MOUTH TWICE DAILY( BEFORE A MEAL) 60 tablet 11   PARoxetine (PAXIL) 40 MG tablet Take 1 tablet (40 mg total) by mouth 2 (two)  times daily. 60 tablet 5   potassium chloride (KLOR-CON) 10 MEQ tablet Take 1 tablet (10 mEq total) by mouth daily. 30 tablet 5   Probiotic CAPS Take 1 capsule by mouth every morning.     rosuvastatin (CRESTOR) 5 MG tablet Take 1 tablet (5 mg total) by mouth daily. 90 tablet 1   SUMAtriptan (IMITREX) 100 MG tablet May repeat in 2 hours if headache persists or recurs. 12 tablet 5   traZODone (DESYREL) 150 MG tablet Take 1 tablet (150 mg total) by mouth at bedtime as needed. for sleep 30 tablet 5   vitamin B-12 (CYANOCOBALAMIN) 1000 MCG tablet Take 1,000 mcg by mouth every morning.     predniSONE (DELTASONE) 20 MG tablet Take 2 tablets (40 mg total) by mouth daily with breakfast. (Patient not taking: Reported on 08/18/2022) 10 tablet 0   No facility-administered medications prior to visit.     Review of Systems:   Constitutional:   No  weight loss, night sweats,  Fevers, chills,  +fatigue, or  lassitude.  HEENT:   No headaches,  Difficulty swallowing,  Tooth/dental problems, or  Sore throat,                No sneezing, itching, ear ache, nasal congestion, post nasal drip,   CV:  No chest pain,  Orthopnea, PND, swelling in lower extremities, anasarca, dizziness, palpitations, syncope.   GI  No heartburn, indigestion, abdominal pain, nausea, vomiting, diarrhea, change in bowel habits, loss of appetite, bloody stools.   Resp:  No excess mucus, no productive cough,  No non-productive cough,  No coughing up of blood.  No change in color of mucus.  No wheezing.  No chest wall deformity  Skin: no rash or lesions.  GU: no dysuria, change in color of urine, no urgency or frequency.  No flank pain, no hematuria   MS:  + Joint pain     Physical Exam  BP 120/78 (BP Location: Left Arm, Patient Position: Sitting, Cuff Size: Normal)   Pulse (!) 58   Temp 98.3 F (36.8 C) (Oral)   Ht  (1.651 m)   Wt 148 lb (67.1 kg)  SpO2 94%   BMI 24.63 kg/m   GEN: A/Ox3; pleasant , NAD, well  nourished    HEENT:  [place]/AT,  NOSE-clear, THROAT-clear, no lesions, no postnasal drip or exudate noted.   NECK:  Supple w/ fair ROM; no JVD; normal carotid impulses w/o bruits; no thyromegaly or nodules palpated; no lymphadenopathy.    RESP  Clear  P & A; w/o, wheezes/ rales/ or rhonchi. no accessory muscle use, no dullness to percussion  CARD:  RRR, no m/r/g, no peripheral edema, pulses intact, no cyanosis or clubbing.  GI:   Soft & nt; nml bowel sounds; no organomegaly or masses detected.   Musco: Warm bil, no deformities or joint swelling noted.   Neuro: alert, no focal deficits noted.    Skin: Warm, no lesions or rashes    Lab Results:  CBC    Component Value Date/Time   WBC 5.4 10/08/2021 0503   RBC 5.02 10/08/2021 0503   HGB 14.5 10/08/2021 0503   HCT 42.3 10/08/2021 0503   PLT 256 10/08/2021 0503   MCV 84.3 10/08/2021 0503   MCH 28.9 10/08/2021 0503   MCHC 34.3 10/08/2021 0503   RDW 14.2 10/08/2021 0503   LYMPHSABS 1.2 10/08/2021 0503   MONOABS 0.6 10/08/2021 0503   EOSABS 0.1 10/08/2021 0503   BASOSABS 0.0 10/08/2021 0503    BMET    Component Value Date/Time   NA 140 03/11/2022 1358   NA 142 11/23/2017 1106   K 4.3 03/11/2022 1358   CL 100 03/11/2022 1358   CO2 36 (H) 03/11/2022 1358   GLUCOSE 90 03/11/2022 1358   BUN 21 03/11/2022 1358   BUN 20 11/23/2017 1106   CREATININE 0.80 03/11/2022 1358   CALCIUM 9.8 03/11/2022 1358   GFRNONAA >60 10/08/2021 0503   GFRAA >60 10/20/2018 0822    BNP    Component Value Date/Time   BNP 86.4 03/09/2021 1838    ProBNP No results found for: "PROBNP"  Imaging: No results found.        No data to display          No results found for: "NITRICOXIDE"      Assessment & Plan:   No problem-specific Assessment & Plan notes found for this encounter.     Rubye Oaks, NP 08/18/2022

## 2022-08-19 DIAGNOSIS — D225 Melanocytic nevi of trunk: Secondary | ICD-10-CM | POA: Diagnosis not present

## 2022-08-19 DIAGNOSIS — L570 Actinic keratosis: Secondary | ICD-10-CM | POA: Diagnosis not present

## 2022-08-19 DIAGNOSIS — J189 Pneumonia, unspecified organism: Secondary | ICD-10-CM | POA: Diagnosis not present

## 2022-08-19 DIAGNOSIS — Z85828 Personal history of other malignant neoplasm of skin: Secondary | ICD-10-CM | POA: Diagnosis not present

## 2022-08-19 DIAGNOSIS — D485 Neoplasm of uncertain behavior of skin: Secondary | ICD-10-CM | POA: Diagnosis not present

## 2022-08-19 DIAGNOSIS — D0462 Carcinoma in situ of skin of left upper limb, including shoulder: Secondary | ICD-10-CM | POA: Diagnosis not present

## 2022-08-19 DIAGNOSIS — L821 Other seborrheic keratosis: Secondary | ICD-10-CM | POA: Diagnosis not present

## 2022-08-20 NOTE — Assessment & Plan Note (Signed)
Chronic pain syndrome.  Continue follow-up with pain clinic Long discussion with patient regarding cautious use of her sedating medications

## 2022-08-20 NOTE — Assessment & Plan Note (Signed)
History of recurrent pneumonia.  Patient has been doing well over the last 6 months.  Last admission was June 2023.  CT chest at that time showed multifocal opacities.  Will repeat CT chest to follow-up for serial clearance. Aspiration precautions discussed.  Caution with sedation use  Plan  Patient Instructions  Set up overnight oximetry test at home on room air. (No oxygen for test ) .  Continue on 1l/m Oxygen At bedtime  .  CT chest without contrast  Albuterol inhaler As needed   Activity as tolerated.  Follow up with Dr. Vassie Loll  in 1 year and As needed

## 2022-08-20 NOTE — Assessment & Plan Note (Signed)
Continue on oxygen 1 L at bedtime.  Overnight oximetry test on room air to qualify for oxygen per insurance requirements

## 2022-08-25 DIAGNOSIS — F411 Generalized anxiety disorder: Secondary | ICD-10-CM | POA: Diagnosis not present

## 2022-08-26 DIAGNOSIS — Z1231 Encounter for screening mammogram for malignant neoplasm of breast: Secondary | ICD-10-CM | POA: Diagnosis not present

## 2022-08-26 DIAGNOSIS — Z01419 Encounter for gynecological examination (general) (routine) without abnormal findings: Secondary | ICD-10-CM | POA: Diagnosis not present

## 2022-08-26 DIAGNOSIS — Z13820 Encounter for screening for osteoporosis: Secondary | ICD-10-CM | POA: Diagnosis not present

## 2022-08-26 DIAGNOSIS — Z780 Asymptomatic menopausal state: Secondary | ICD-10-CM | POA: Diagnosis not present

## 2022-08-27 ENCOUNTER — Other Ambulatory Visit: Payer: Self-pay | Admitting: Gynecology

## 2022-08-27 DIAGNOSIS — Z13820 Encounter for screening for osteoporosis: Secondary | ICD-10-CM

## 2022-08-29 ENCOUNTER — Other Ambulatory Visit: Payer: Self-pay | Admitting: Internal Medicine

## 2022-08-31 ENCOUNTER — Telehealth: Payer: Self-pay | Admitting: Internal Medicine

## 2022-08-31 DIAGNOSIS — M5416 Radiculopathy, lumbar region: Secondary | ICD-10-CM

## 2022-08-31 NOTE — Telephone Encounter (Signed)
Patient called and would like physical therapy orders placed with [facility].  Fax number:  [phone] - Please put for Bianca Haley on Orders.

## 2022-09-01 NOTE — Telephone Encounter (Signed)
Okay.  Thanks.

## 2022-09-01 NOTE — Telephone Encounter (Signed)
Notified pt w/MD response.[name]/LMB

## 2022-09-07 ENCOUNTER — Ambulatory Visit (HOSPITAL_COMMUNITY)
Admission: RE | Admit: 2022-09-07 | Discharge: 2022-09-07 | Disposition: A | Discharge status: Home / Self Care | Payer: Medicare Other | Source: Ambulatory Visit | Attending: Adult Health | Admitting: Adult Health

## 2022-09-07 DIAGNOSIS — J189 Pneumonia, unspecified organism: Secondary | ICD-10-CM | POA: Diagnosis not present

## 2022-09-07 DIAGNOSIS — J479 Bronchiectasis, uncomplicated: Secondary | ICD-10-CM | POA: Diagnosis not present

## 2022-09-07 DIAGNOSIS — R918 Other nonspecific abnormal finding of lung field: Secondary | ICD-10-CM | POA: Diagnosis not present

## 2022-09-08 DIAGNOSIS — F411 Generalized anxiety disorder: Secondary | ICD-10-CM | POA: Diagnosis not present

## 2022-09-09 DIAGNOSIS — D0462 Carcinoma in situ of skin of left upper limb, including shoulder: Secondary | ICD-10-CM | POA: Diagnosis not present

## 2022-09-11 ENCOUNTER — Other Ambulatory Visit: Payer: Self-pay

## 2022-09-11 DIAGNOSIS — R911 Solitary pulmonary nodule: Secondary | ICD-10-CM

## 2022-09-11 DIAGNOSIS — J479 Bronchiectasis, uncomplicated: Secondary | ICD-10-CM

## 2022-09-11 NOTE — Addendum Note (Signed)
Addended by: Lajoyce Lauber A on: 09/11/2022 01:48 PM   Modules accepted: Orders

## 2022-09-18 DIAGNOSIS — J189 Pneumonia, unspecified organism: Secondary | ICD-10-CM | POA: Diagnosis not present

## 2022-09-20 ENCOUNTER — Other Ambulatory Visit: Payer: Self-pay | Admitting: Internal Medicine

## 2022-09-24 ENCOUNTER — Telehealth: Payer: Self-pay | Admitting: Internal Medicine

## 2022-09-24 MED ORDER — DIAZEPAM 5 MG PO TABS: ORAL_TABLET | ORAL | 0 refills | Status: DC

## 2022-09-24 MED ORDER — DILTIAZEM HCL ER COATED BEADS 240 MG PO CP24: 240.0000 mg | ORAL_CAPSULE | Freq: Every morning | ORAL | 1 refills | Status: DC

## 2022-09-24 MED ORDER — ROSUVASTATIN CALCIUM 5 MG PO TABS: 5.0000 mg | ORAL_TABLET | Freq: Every day | ORAL | 1 refills | Status: DC

## 2022-09-24 MED ORDER — SUMATRIPTAN SUCCINATE 100 MG PO TABS: ORAL_TABLET | ORAL | 5 refills | Status: DC

## 2022-09-24 NOTE — Addendum Note (Signed)
Addended by: Corwin Levins on: 09/24/2022 04:53 PM   Modules accepted: Orders

## 2022-09-24 NOTE — Telephone Encounter (Signed)
Sent maintenance meds pls advise on Diazepam../lb

## 2022-09-24 NOTE — Telephone Encounter (Signed)
Prescription Request  09/24/2022  LOV: 08/12/2022  What is the name of the medication or equipment? diltiazem (CARDIZEM CD) 240 MG 24 hr capsule  rosuvastatin (CRESTOR) 5 MG tablet  diazepam (VALIUM) 5 MG tablet  SUMAtriptan (IMITREX) 100 MG tablet   Have you contacted your pharmacy to request a refill? No   Which pharmacy would you like this sent to?  [facility] [id number] [place], [place] - [address] [place] [address] Phone: [phone] Fax: [phone]    Patient notified that their request is being sent to the clinical staff for review and that they should receive a response within 2 business days.   Please advise at Mobile [phone] (mobile)    Next OV is 11/18/2022.

## 2022-09-24 NOTE — Addendum Note (Signed)
Addended by: Deatra James on: 09/24/2022 03:53 PM   Modules accepted: Orders

## 2022-09-24 NOTE — Telephone Encounter (Signed)
Done erx 

## 2022-09-27 IMAGING — CT CT HEAD W/O CM
4 series · 16 of 47 positions shown, 18 images · non-contrast
Comparison: September 27, 2013 head CT; brain MRI December 28, 2017

CLINICAL DATA: Confusion/altered mental status

EXAM:
CT HEAD WITHOUT CONTRAST
TECHNIQUE: Contiguous axial images were obtained from the base of the skull
through the vertex without intravenous contrast.

[Series 3: head without · axial · non-contrast · 0.47mm/px · z∈[+1205,+1330]mm · 7 of 35 slices shown, 9 images]
[im 5/35  brain]
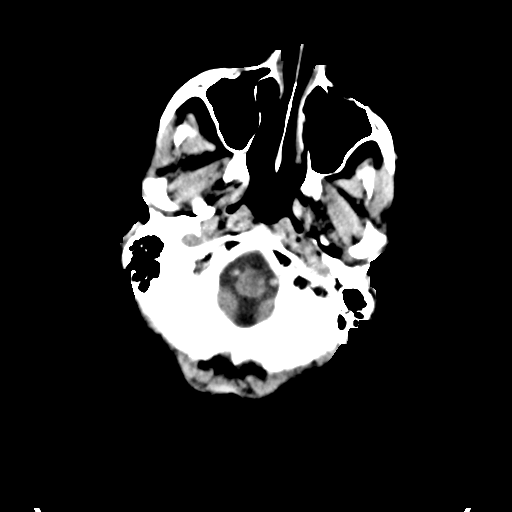
[im 5/35  bone]
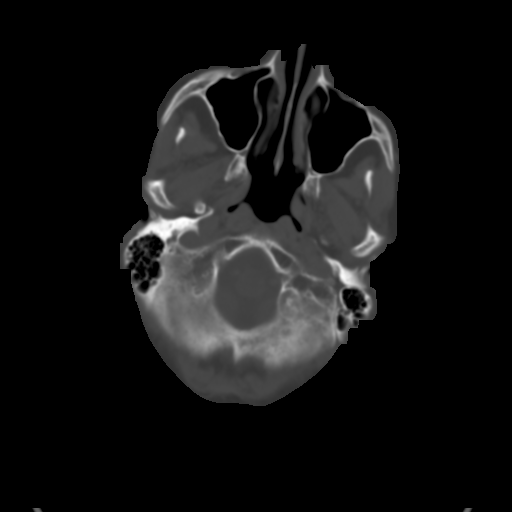
[im 9/35  brain]
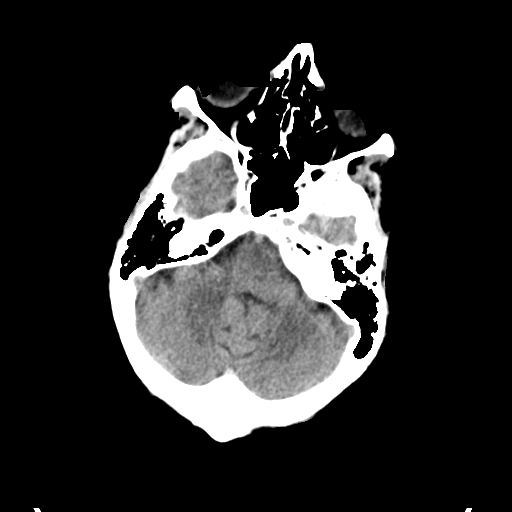
[im 13/35  brain]
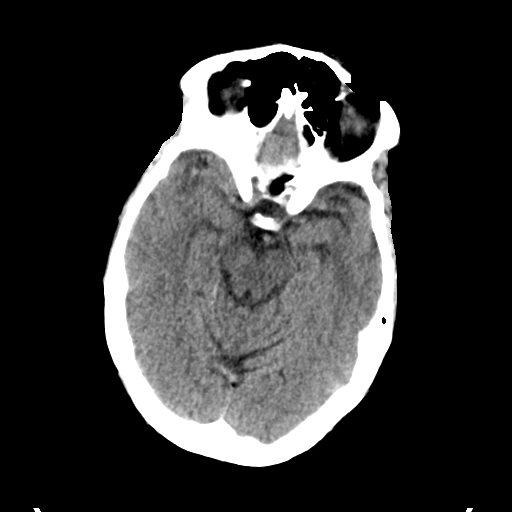
[im 18/35  brain]
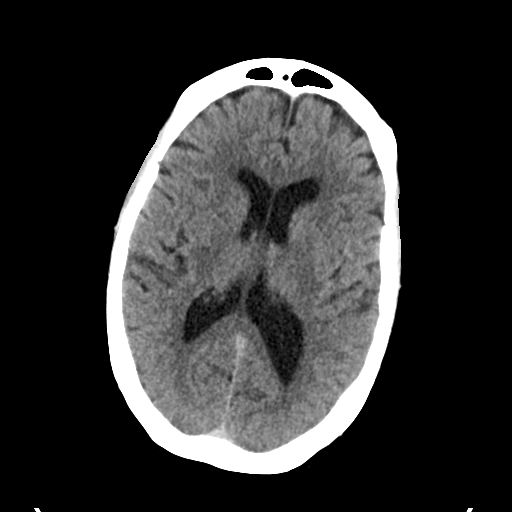
[im 22/35  brain]
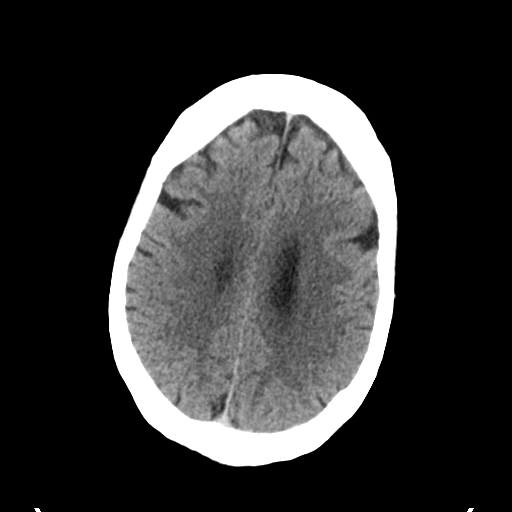
[im 22/35  bone]
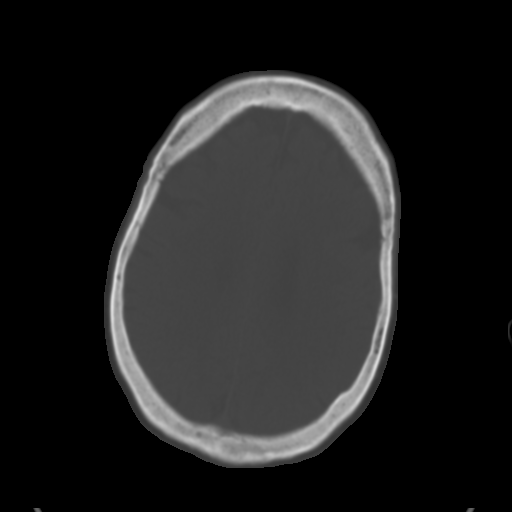
[im 26/35  brain]
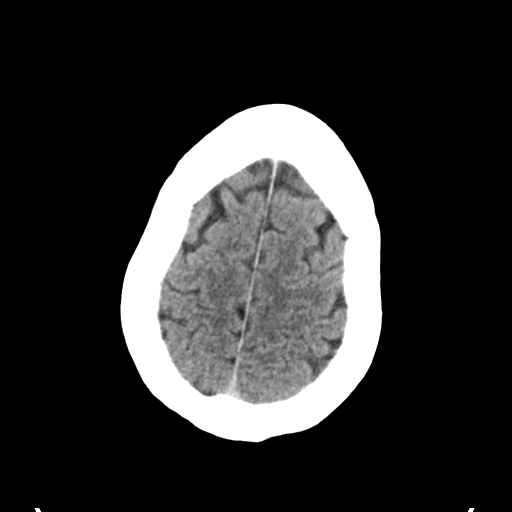
[im 30/35  brain]
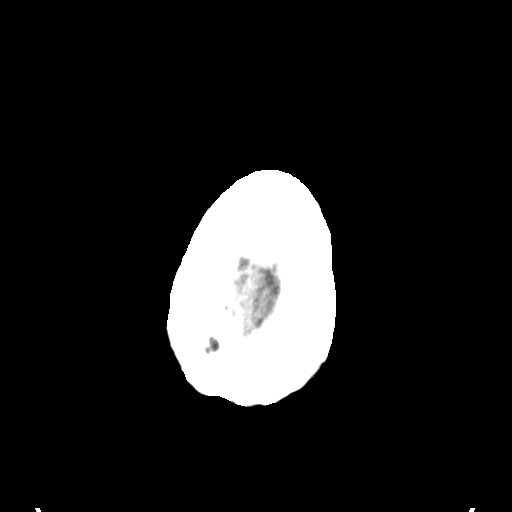

[Series 4: head bone · axial · 0.47mm/px · z∈[+1201,+1235]mm · 3 of 86 slices shown]
[im 9/86  bone]
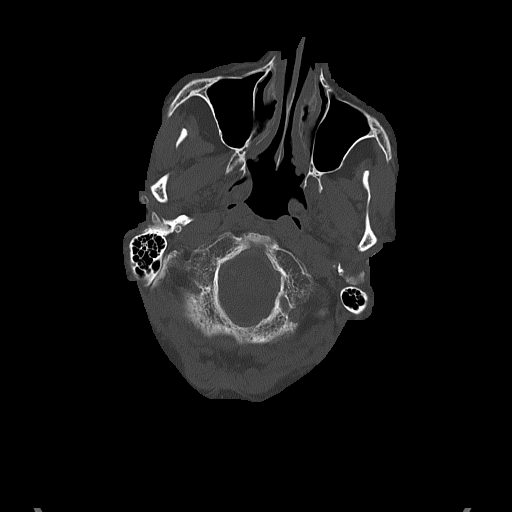
[im 18/86  bone]
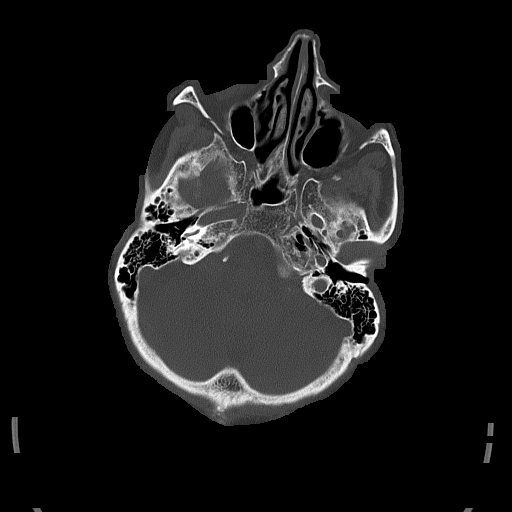
[im 26/86  bone]
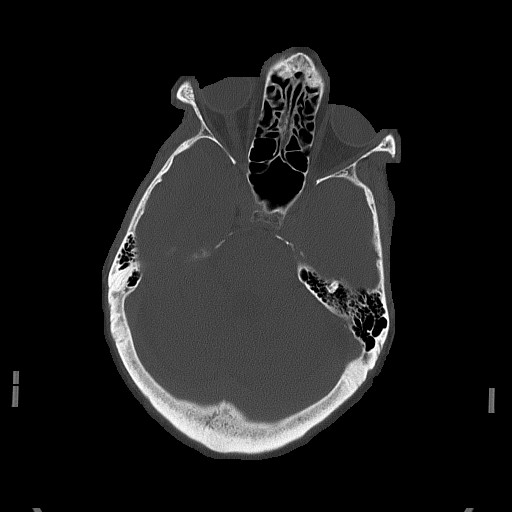

[Series 5: head without cor · coronal · non-contrast · 0.32mm/px · 3 of 78 slices shown]
[im 26/78  brain]
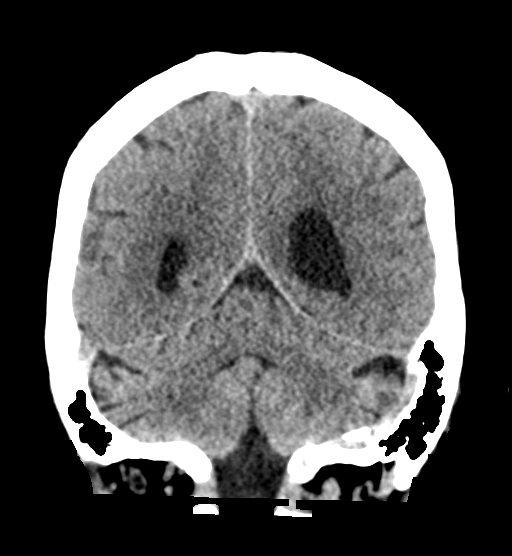
[im 35/78  brain]
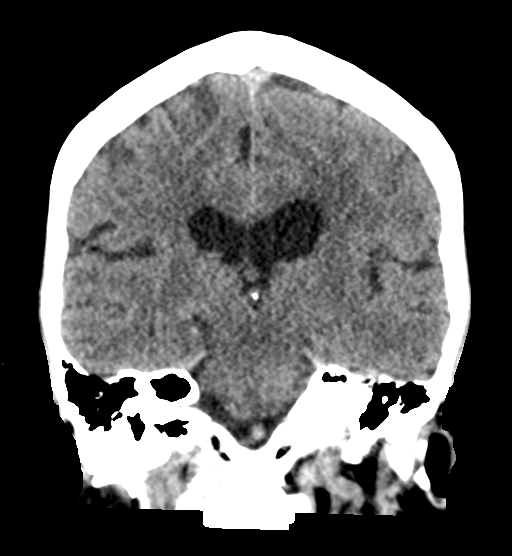
[im 43/78  brain]
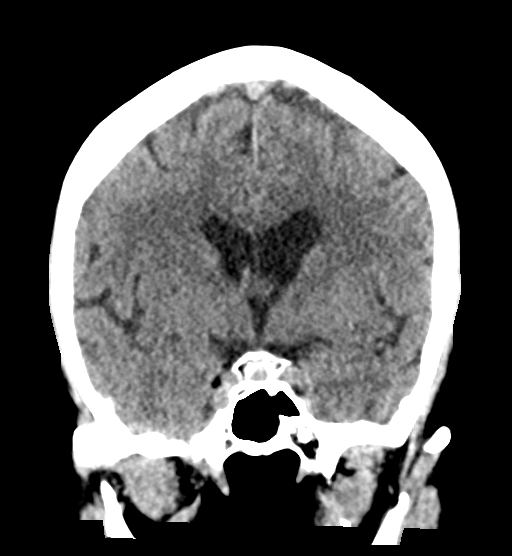

[Series 6: head without sag · sagittal · non-contrast · 0.37mm/px · 3 of 57 slices shown]
[im 19/57  brain]
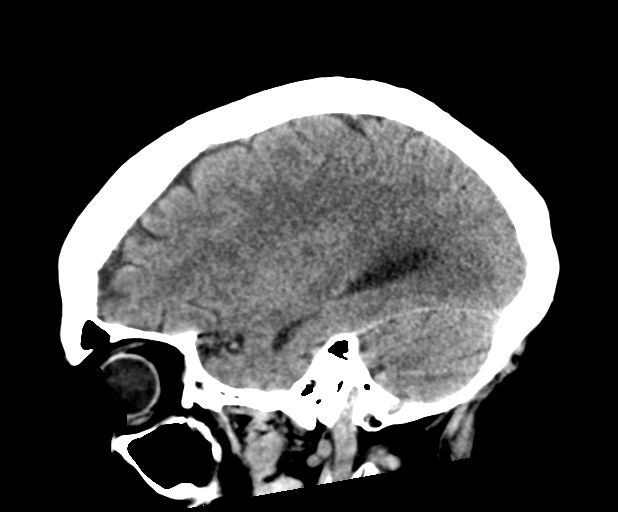
[im 29/57  brain]
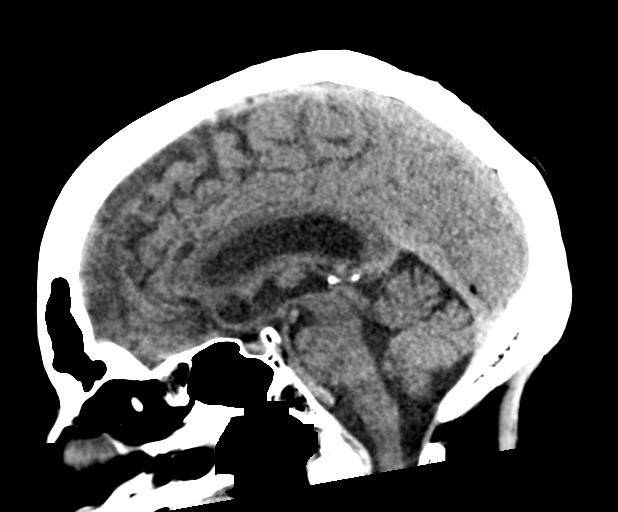
[im 38/57  brain]
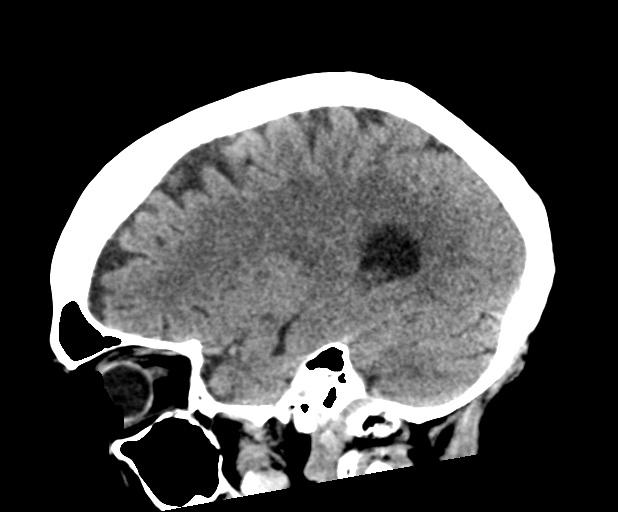

[16 of 47 positions shown; findings below may reference images not displayed]

FINDINGS: Brain: There is age related volume loss. There is no intracranial
mass, hemorrhage, extra-axial fluid collection, or midline shift.
There is slight decreased attenuation in the centra semiovale
bilaterally. Elsewhere brain parenchyma appears unremarkable. No
evident acute infarct.

Vascular: No hyperdense vessel. No appreciable vascular
calcification.

Skull: The bony calvarium appears intact.

Sinuses/Orbits: There is slight mucosal thickening in several
ethmoid air cells. Other paranasal sinuses are clear. Orbits appear
symmetric bilaterally.

Other: Mastoid air cells are clear.
IMPRESSION: Age related volume loss with slight periventricular small vessel
disease. No acute infarct. No mass or hemorrhage.

Mild mucosal thickening noted in several ethmoid air cells.

## 2022-09-29 ENCOUNTER — Telehealth: Payer: Self-pay | Admitting: Internal Medicine

## 2022-09-29 NOTE — Telephone Encounter (Signed)
Called pt inform her MD sent May script already. She should contact pharmacy to check status.Marland KitchenRaechel Haley

## 2022-09-29 NOTE — Telephone Encounter (Signed)
Prescription Request  09/29/2022  LOV: 08/12/2022  What is the name of the medication or equipment? oxycodone  Have you contacted your pharmacy to request a refill? Yes   Which pharmacy would you like this sent to?  [id number] [place], [place] - [address] [place] [address] Phone: [phone] Fax: [phone]    Patient notified that their request is being sent to the clinical staff for review and that they should receive a response within 2 business days.   Please advise at Mobile [phone] (mobile)

## 2022-09-30 DIAGNOSIS — M5459 Other low back pain: Secondary | ICD-10-CM | POA: Diagnosis not present

## 2022-09-30 DIAGNOSIS — M546 Pain in thoracic spine: Secondary | ICD-10-CM | POA: Diagnosis not present

## 2022-09-30 DIAGNOSIS — M25562 Pain in left knee: Secondary | ICD-10-CM | POA: Diagnosis not present

## 2022-10-14 DIAGNOSIS — M5459 Other low back pain: Secondary | ICD-10-CM | POA: Diagnosis not present

## 2022-10-14 DIAGNOSIS — M546 Pain in thoracic spine: Secondary | ICD-10-CM | POA: Diagnosis not present

## 2022-10-14 DIAGNOSIS — M25562 Pain in left knee: Secondary | ICD-10-CM | POA: Diagnosis not present

## 2022-10-19 DIAGNOSIS — M546 Pain in thoracic spine: Secondary | ICD-10-CM | POA: Diagnosis not present

## 2022-10-19 DIAGNOSIS — M25562 Pain in left knee: Secondary | ICD-10-CM | POA: Diagnosis not present

## 2022-10-19 DIAGNOSIS — M5459 Other low back pain: Secondary | ICD-10-CM | POA: Diagnosis not present

## 2022-10-19 DIAGNOSIS — J189 Pneumonia, unspecified organism: Secondary | ICD-10-CM | POA: Diagnosis not present

## 2022-10-20 DIAGNOSIS — F411 Generalized anxiety disorder: Secondary | ICD-10-CM | POA: Diagnosis not present

## 2022-10-21 DIAGNOSIS — M5459 Other low back pain: Secondary | ICD-10-CM | POA: Diagnosis not present

## 2022-10-21 DIAGNOSIS — M546 Pain in thoracic spine: Secondary | ICD-10-CM | POA: Diagnosis not present

## 2022-10-21 DIAGNOSIS — M25562 Pain in left knee: Secondary | ICD-10-CM | POA: Diagnosis not present

## 2022-10-22 ENCOUNTER — Telehealth: Payer: Self-pay | Admitting: Internal Medicine

## 2022-10-22 ENCOUNTER — Other Ambulatory Visit: Payer: Self-pay | Admitting: Internal Medicine

## 2022-10-22 NOTE — Telephone Encounter (Signed)
Prescription Request  10/22/2022  LOV: 08/12/2022  What is the name of the medication or equipment?  diazepam (VALIUM) 5 MG tablet   Have you contacted your pharmacy to request a refill? No   Which pharmacy would you like this sent to?  [facility] [id number] [place], [place] - [address] [place] [address] Phone: [phone] Fax: [phone]    Patient notified that their request is being sent to the clinical staff for review and that they should receive a response within 2 business days.   Please advise at Mobile [phone] (mobile)    Next OV is 11/18/2022.

## 2022-10-23 MED ORDER — DIAZEPAM 5 MG PO TABS: ORAL_TABLET | ORAL | 3 refills | Status: DC

## 2022-10-23 NOTE — Telephone Encounter (Signed)
Okay.  Thanks.

## 2022-11-03 DIAGNOSIS — F411 Generalized anxiety disorder: Secondary | ICD-10-CM | POA: Diagnosis not present

## 2022-11-04 ENCOUNTER — Other Ambulatory Visit: Payer: Self-pay | Admitting: Internal Medicine

## 2022-11-04 DIAGNOSIS — M546 Pain in thoracic spine: Secondary | ICD-10-CM | POA: Diagnosis not present

## 2022-11-04 DIAGNOSIS — M5459 Other low back pain: Secondary | ICD-10-CM | POA: Diagnosis not present

## 2022-11-04 DIAGNOSIS — M25562 Pain in left knee: Secondary | ICD-10-CM | POA: Diagnosis not present

## 2022-11-06 ENCOUNTER — Other Ambulatory Visit: Payer: Self-pay

## 2022-11-06 ENCOUNTER — Telehealth: Payer: Self-pay | Admitting: Internal Medicine

## 2022-11-06 DIAGNOSIS — M546 Pain in thoracic spine: Secondary | ICD-10-CM | POA: Diagnosis not present

## 2022-11-06 DIAGNOSIS — M5459 Other low back pain: Secondary | ICD-10-CM | POA: Diagnosis not present

## 2022-11-06 DIAGNOSIS — M25562 Pain in left knee: Secondary | ICD-10-CM | POA: Diagnosis not present

## 2022-11-06 MED ORDER — APIXABAN 5 MG PO TABS: 5.0000 mg | ORAL_TABLET | Freq: Two times a day (BID) | ORAL | 5 refills | Status: DC

## 2022-11-06 MED ORDER — MIRABEGRON ER 50 MG PO TB24: 50.0000 mg | ORAL_TABLET | Freq: Every morning | ORAL | 5 refills | Status: DC

## 2022-11-06 NOTE — Telephone Encounter (Signed)
Refills sent in today. 

## 2022-11-06 NOTE — Telephone Encounter (Signed)
Patient said she has one pill left of the Myrbetriq. She has an OV on 11/18/2022.    Prescription Request  11/06/2022  LOV: 08/12/2022  What is the name of the medication or equipment?  mirabegron ER (MYRBETRIQ) 50 MG TB24 tablet  apixaban (ELIQUIS) 5 MG TABS tablet  Have you contacted your pharmacy to request a refill? Yes   Which pharmacy would you like this sent to?  [facility] [id number] [place], [place] - [address] [place] [address] Phone: [phone] Fax: [phone]    Patient notified that their request is being sent to the clinical staff for review and that they should receive a response within 2 business days.   Please advise at Mobile [phone] (mobile)

## 2022-11-09 ENCOUNTER — Telehealth: Payer: Self-pay | Admitting: Internal Medicine

## 2022-11-09 NOTE — Telephone Encounter (Signed)
Prescription Request  11/09/2022  LOV: 08/12/2022  What is the name of the medication or equipment? oxycodone  Have you contacted your pharmacy to request a refill? Yes   Which pharmacy would you like this sent to?  [id number] [place], [place] - [address] [place] [address] Phone: [phone] Fax: [phone]    Patient notified that their request is being sent to the clinical staff for review and that they should receive a response within 2 business days.   Please advise at Mobile [phone] (mobile)

## 2022-11-10 NOTE — Telephone Encounter (Signed)
Patient called to check on the status of her refill. She is out of medication. Best callback is [phone].

## 2022-11-11 DIAGNOSIS — M25562 Pain in left knee: Secondary | ICD-10-CM | POA: Diagnosis not present

## 2022-11-11 DIAGNOSIS — M5459 Other low back pain: Secondary | ICD-10-CM | POA: Diagnosis not present

## 2022-11-11 DIAGNOSIS — M546 Pain in thoracic spine: Secondary | ICD-10-CM | POA: Diagnosis not present

## 2022-11-11 NOTE — Telephone Encounter (Signed)
There is a 24-72 turn around time on refills. Pt has appt on 11/18/22.Marland KitchenRaechel Chute

## 2022-11-11 NOTE — Telephone Encounter (Signed)
Patient would like to know why the medication has not been filled yet. She is out of her medication and doesn't know why it can't be filled. She would like a call back at [phone].

## 2022-11-13 MED ORDER — OXYCODONE-ACETAMINOPHEN 5-325 MG PO TABS: 1.0000 | ORAL_TABLET | Freq: Three times a day (TID) | ORAL | 0 refills | Status: DC | PRN

## 2022-11-13 NOTE — Telephone Encounter (Signed)
Done. Thx.

## 2022-11-16 DIAGNOSIS — M546 Pain in thoracic spine: Secondary | ICD-10-CM | POA: Diagnosis not present

## 2022-11-16 DIAGNOSIS — M5459 Other low back pain: Secondary | ICD-10-CM | POA: Diagnosis not present

## 2022-11-16 DIAGNOSIS — M25562 Pain in left knee: Secondary | ICD-10-CM | POA: Diagnosis not present

## 2022-11-17 DIAGNOSIS — F411 Generalized anxiety disorder: Secondary | ICD-10-CM | POA: Diagnosis not present

## 2022-11-18 ENCOUNTER — Encounter: Payer: Self-pay | Admitting: Internal Medicine

## 2022-11-18 ENCOUNTER — Ambulatory Visit (INDEPENDENT_AMBULATORY_CARE_PROVIDER_SITE_OTHER): Payer: Medicare Other | Admitting: Internal Medicine

## 2022-11-18 VITALS — BP 120/78 | HR 64 | Temp 98.2°F | Ht 65.0 in | Wt 144.6 lb

## 2022-11-18 DIAGNOSIS — N183 Chronic kidney disease, stage 3 unspecified: Secondary | ICD-10-CM | POA: Diagnosis not present

## 2022-11-18 DIAGNOSIS — M5442 Lumbago with sciatica, left side: Secondary | ICD-10-CM | POA: Diagnosis not present

## 2022-11-18 DIAGNOSIS — Z0000 Encounter for general adult medical examination without abnormal findings: Secondary | ICD-10-CM

## 2022-11-18 DIAGNOSIS — I10 Essential (primary) hypertension: Secondary | ICD-10-CM

## 2022-11-18 DIAGNOSIS — I480 Paroxysmal atrial fibrillation: Secondary | ICD-10-CM | POA: Diagnosis not present

## 2022-11-18 DIAGNOSIS — G8929 Other chronic pain: Secondary | ICD-10-CM

## 2022-11-18 DIAGNOSIS — J189 Pneumonia, unspecified organism: Secondary | ICD-10-CM | POA: Diagnosis not present

## 2022-11-18 DIAGNOSIS — M5441 Lumbago with sciatica, right side: Secondary | ICD-10-CM

## 2022-11-18 MED ORDER — OXYCODONE-ACETAMINOPHEN 5-325 MG PO TABS: 1.0000 | ORAL_TABLET | Freq: Three times a day (TID) | ORAL | 0 refills | Status: DC | PRN

## 2022-11-18 NOTE — Assessment & Plan Note (Signed)
Stable. Cont w/Atenolol, Diltiazem,  Maxzide 

## 2022-11-18 NOTE — Assessment & Plan Note (Signed)
 Cont w/good hydration Monitor GFR

## 2022-11-18 NOTE — Assessment & Plan Note (Addendum)
Chronic Percocet prn  Potential benefits of a long term opioids use as well as potential risks (i.e. addiction risk, apnea etc) and complications (i.e. Somnolence, constipation and others) were explained to the patient and were aknowledged. A compound cream Rx

## 2022-11-18 NOTE — Assessment & Plan Note (Signed)
Cont on Eliquis- 

## 2022-11-18 NOTE — Progress Notes (Signed)
ORIF    Subjective:  Patient ID: Bianca Haley, female    DOB: 1951-04-02  Age: 72 y.o. MRN: [id number]  CC: Follow-up (3 MNTH F/U)   HPI DIGNA COUNTESS presents for chro fornic LBP, depression, anxiety and  Outpatient Medications Prior to Visit  Medication Sig Dispense Refill   acetaminophen (TYLENOL) 650 MG CR tablet Take 1,300 mg by mouth every 8 (eight) hours as needed for pain.     albuterol (VENTOLIN HFA) 108 (90 Base) MCG/ACT inhaler INHALE 2 PUFFS INTO THE LUNGS EVERY 4 HOURS AS NEEDED FOR WHEEZING OR SHORTNESS OF BREATH 18 g 5   apixaban (ELIQUIS) 5 MG TABS tablet Take 1 tablet (5 mg total) by mouth 2 (two) times daily. 60 tablet 5   Ascorbic Acid (VITAMIN C PO) Take 1 tablet by mouth every morning.     Calcium Carb-Cholecalciferol (CALCIUM PLUS VITAMIN D3) 600-500 MG-UNIT CAPS Take 2 tablets by mouth every morning.     Cholecalciferol (VITAMIN D) 2000 units tablet Take 2,000 Units by mouth at bedtime.      cycloSPORINE (RESTASIS) 0.05 % ophthalmic emulsion Place 1 drop into both eyes every morning.     diazepam (VALIUM) 5 MG tablet Take 2.5 mg bid. Ok to take 2.5 mg in mid-day if needed. 30 tablet 3   diltiazem (CARDIZEM CD) 240 MG 24 hr capsule Take 1 capsule (240 mg total) by mouth every morning. 90 capsule 1   fluticasone (FLONASE) 50 MCG/ACT nasal spray Place 2 sprays into both nostrils daily as needed for allergies or rhinitis. 16 g 5   hydrocortisone 2.5 % cream Apply twice daily for up to one week as needed for flares 30 g 2   irbesartan (AVAPRO) 150 MG tablet Take 1 tablet (150 mg total) by mouth daily. 30 tablet 11   metoprolol tartrate (LOPRESSOR) 50 MG tablet TAKE 1 TABLET(50 MG) BY MOUTH TWICE DAILY 60 tablet 11   mirabegron ER (MYRBETRIQ) 50 MG TB24 tablet Take 1 tablet (50 mg total) by mouth every morning. 30 tablet 5   Multiple Vitamin (MULTIVITAMIN WITH MINERALS) TABS tablet Take 1 tablet by mouth 2 (two) times daily.     naloxone (NARCAN) nasal spray  4 mg/0.1 mL 1 actuation in one nostril once. May repeat in 2-3 min 1 each 2   OVER THE COUNTER MEDICATION Take 2 capsules by mouth in the morning. [place]     pantoprazole (PROTONIX) 40 MG tablet TAKE 1 TABLET BY MOUTH TWICE DAILY( BEFORE A MEAL) 60 tablet 11   PARoxetine (PAXIL) 40 MG tablet TAKE 1 TABLET BY MOUTH TWICE A DAY 60 tablet 5   potassium chloride (KLOR-CON) 10 MEQ tablet TAKE 1 TABLET BY MOUTH DAILY 90 tablet 3   Probiotic CAPS Take 1 capsule by mouth every morning.     rosuvastatin (CRESTOR) 5 MG tablet Take 1 tablet (5 mg total) by mouth daily. 90 tablet 1   SUMAtriptan (IMITREX) 100 MG tablet May repeat in 2 hours if headache persists or recurs. 12 tablet 5   traZODone (DESYREL) 150 MG tablet TAKE 1 TABLET BY MOUTH EVERY NIGHT AT BEDTIME AS NEEDED FOR SLEEP 30 tablet 5   vitamin B-12 (CYANOCOBALAMIN) 1000 MCG tablet Take 1,000 mcg by mouth every morning.     oxyCODONE-acetaminophen (PERCOCET/ROXICET) 5-325 MG tablet Take 1 tablet by mouth every 8 (eight) hours as needed for severe pain. 90 tablet 0   oxyCODONE-acetaminophen (PERCOCET/ROXICET) 5-325 MG tablet Take 1 tablet by  mouth every 8 (eight) hours as needed for severe pain. 90 tablet 0   No facility-administered medications prior to visit.    ROS: Review of Systems  Constitutional:  Negative for activity change, appetite change, chills, fatigue and unexpected weight change.  HENT:  Negative for congestion, mouth sores and sinus pressure.   Eyes:  Negative for visual disturbance.  Respiratory:  Negative for cough and chest tightness.   Gastrointestinal:  Negative for abdominal pain and nausea.  Genitourinary:  Negative for difficulty urinating, frequency and vaginal pain.  Musculoskeletal:  Positive for back pain and gait problem.  Skin:  Negative for pallor and rash.  Neurological:  Negative for dizziness, tremors, weakness, numbness and headaches.  Psychiatric/Behavioral:  Negative for confusion, sleep disturbance  and suicidal ideas. The patient is nervous/anxious.     Objective:  BP 120/78 (BP Location: Left Arm, Patient Position: Sitting, Cuff Size: Large)   Pulse 64   Temp 98.2 F (36.8 C) (Oral)   Ht 5\' 5"  (1.651 m)   Wt 144 lb 9.6 oz (65.6 kg)   SpO2 96%   BMI 24.06 kg/m   BP Readings from Last 3 Encounters:  11/18/22 120/78  08/18/22 120/78  08/12/22 132/68    Wt Readings from Last 3 Encounters:  11/18/22 144 lb 9.6 oz (65.6 kg)  08/18/22 148 lb (67.1 kg)  08/12/22 147 lb (66.7 kg)    Physical Exam Constitutional:      General: She is not in acute distress.    Appearance: Normal appearance. She is well-developed.  HENT:     Head: Normocephalic.     Right Ear: External ear normal.     Left Ear: External ear normal.     Nose: Nose normal.  Eyes:     General:        Right eye: No discharge.        Left eye: No discharge.     Conjunctiva/sclera: Conjunctivae normal.     Pupils: Pupils are equal, round, and reactive to light.  Neck:     Thyroid: No thyromegaly.     Vascular: No JVD.     Trachea: No tracheal deviation.  Cardiovascular:     Rate and Rhythm: Normal rate and regular rhythm.     Heart sounds: Normal heart sounds.  Pulmonary:     Effort: No respiratory distress.     Breath sounds: No stridor. No wheezing.  Abdominal:     General: Bowel sounds are normal. There is no distension.     Palpations: Abdomen is soft. There is no mass.     Tenderness: There is no abdominal tenderness. There is no guarding or rebound.  Musculoskeletal:        General: Tenderness present.     Cervical back: Normal range of motion and neck supple. No rigidity.     Right lower leg: No edema.     Left lower leg: No edema.  Lymphadenopathy:     Cervical: No cervical adenopathy.  Skin:    Findings: No erythema or rash.  Neurological:     Mental Status: She is oriented to person, place, and time.     Cranial Nerves: No cranial nerve deficit.     Motor: No abnormal muscle tone.      Coordination: Coordination abnormal.     Gait: Gait abnormal.     Deep Tendon Reflexes: Reflexes normal.  Psychiatric:        Behavior: Behavior normal.  Thought Content: Thought content normal.        Judgment: Judgment normal.   LS w/pain Antalgic gait  Lab Results  Component Value Date   WBC 5.4 10/08/2021   HGB 14.5 10/08/2021   HCT 42.3 10/08/2021   PLT 256 10/08/2021   GLUCOSE 90 03/11/2022   CHOL 155 04/03/2020   TRIG 102.0 04/03/2020   HDL 61.40 04/03/2020   LDLCALC 73 04/03/2020   ALT 9 03/11/2022   AST 20 03/11/2022   NA 140 03/11/2022   K 4.3 03/11/2022   CL 100 03/11/2022   CREATININE 0.80 03/11/2022   BUN 21 03/11/2022   CO2 36 (H) 03/11/2022   TSH 1.43 09/18/2021   INR 1.0 09/21/2018   HGBA1C 6.1 06/23/2018    CT Chest Wo Contrast  Result Date: 09/10/2022 CLINICAL DATA:  Follow-up pneumonia. EXAM: CT CHEST WITHOUT CONTRAST TECHNIQUE: Multidetector CT imaging of the chest was performed following the standard protocol without IV contrast. RADIATION DOSE REDUCTION: This exam was performed according to the departmental dose-optimization program which includes automated exposure control, adjustment of the mA and/or kV according to patient size and/or use of iterative reconstruction technique. COMPARISON:  10/05/2021 chest CT angiogram. FINDINGS: Cardiovascular: Normal heart size. No significant pericardial effusion/thickening. Atherosclerotic nonaneurysmal thoracic aorta. Normal caliber pulmonary arteries. Mediastinum/Nodes: No significant thyroid nodules. Unremarkable esophagus. No axillary adenopathy. Mildly enlarged 1.5 cm lower right paratracheal node (series 3/image 59), mildly decreased from 1.8 cm. No additional pathologically enlarged mediastinal nodes. No discrete hilar adenopathy on these noncontrast images. Lungs/Pleura: No pneumothorax. No pleural effusion. No acute consolidative airspace disease or lung masses. Widespread patchy regions of  mild-to-moderate cylindrical bronchiectasis with diffuse bronchial wall thickening and associated patchy tree-in-bud opacities and clustered centrilobular solid nodules in both lungs involving all lung lobes, most prominent in the basilar right upper lobe, right middle lobe and lingula. The previously visualized patchy regions of consolidation in the mid to lower lungs on 10/05/2021 chest CT angiogram study have resolved. Bandlike postinfectious scarring in inferior lingula is similar. Representative solid 0.9 cm posterior basilar right lower lobe nodule (series 4/image 127), previously obscured by consolidation. The bronchiectasis and tree-in-bud opacities are mildly worsened in the interval. Upper abdomen: Small hiatal hernia. Musculoskeletal: No aggressive appearing focal osseous lesions. Marked thoracic spondylosis with chronic exaggerated upper thoracic kyphosis. IMPRESSION: 1. Widespread mild-to-moderate cylindrical bronchiectasis with diffuse bronchial wall thickening and associated patchy tree-in-bud opacities and clustered centrilobular solid nodules, mildly worsened in the interval. Findings are most compatible with a chronic infectious bronchiolitis such as due to atypical mycobacterial infection (MAI). Suggest follow-up high-resolution chest CT in 6 months to reassess the nodularity. 2. Previously visualized patchy regions of consolidation in the mid to lower lungs on 10/05/2021 chest CT angiogram study have resolved. 3. Mild mediastinal lymphadenopathy, mildly decreased, most compatible with benign reactive adenopathy. 4. Small hiatal hernia. 5.  Aortic Atherosclerosis (ICD10-I70.0). Electronically Signed   By: Delbert Phenix M.D.   On: 09/10/2022 12:00    Assessment & Plan:   Problem List Items Addressed This Visit     Essential hypertension - Primary    Stable. Cont w/Atenolol, Diltiazem,  Maxzide      CRI (chronic renal insufficiency), stage 3 (moderate) (HCC)    Cont w/good  hydration Monitor GFR      Relevant Orders   TSH   CBC with Differential/Platelet   Comprehensive metabolic panel   Paroxysmal atrial fibrillation (HCC)    Cont on Eliquis      Low  back pain    Chronic Percocet prn  Potential benefits of a long term opioids use as well as potential risks (i.e. addiction risk, apnea etc) and complications (i.e. Somnolence, constipation and others) were explained to the patient and were aknowledged. A compound cream Rx      Relevant Medications   oxyCODONE-acetaminophen (PERCOCET/ROXICET) 5-325 MG tablet   oxyCODONE-acetaminophen (PERCOCET/ROXICET) 5-325 MG tablet   Other Visit Diagnoses     Well adult exam       Relevant Orders   TSH   Urinalysis   CBC with Differential/Platelet   Lipid panel   Comprehensive metabolic panel         Meds ordered this encounter  Medications   oxyCODONE-acetaminophen (PERCOCET/ROXICET) 5-325 MG tablet    Sig: Take 1 tablet by mouth every 8 (eight) hours as needed for severe pain.    Dispense:  90 tablet    Refill:  0    Please fill on or after 12/13/22   oxyCODONE-acetaminophen (PERCOCET/ROXICET) 5-325 MG tablet    Sig: Take 1 tablet by mouth every 8 (eight) hours as needed for severe pain.    Dispense:  90 tablet    Refill:  0    Please fill on or after 01/12/23      Follow-up: Return in about 3 months (around 02/18/2023) for Wellness Exam.  Sonda Primes, MD [facility] and has been having

## 2022-11-19 DIAGNOSIS — M546 Pain in thoracic spine: Secondary | ICD-10-CM | POA: Diagnosis not present

## 2022-11-19 DIAGNOSIS — M5459 Other low back pain: Secondary | ICD-10-CM | POA: Diagnosis not present

## 2022-11-19 DIAGNOSIS — M25562 Pain in left knee: Secondary | ICD-10-CM | POA: Diagnosis not present

## 2022-11-23 DIAGNOSIS — M5459 Other low back pain: Secondary | ICD-10-CM | POA: Diagnosis not present

## 2022-11-23 DIAGNOSIS — M546 Pain in thoracic spine: Secondary | ICD-10-CM | POA: Diagnosis not present

## 2022-11-23 DIAGNOSIS — H04123 Dry eye syndrome of bilateral lacrimal glands: Secondary | ICD-10-CM | POA: Diagnosis not present

## 2022-11-23 DIAGNOSIS — H524 Presbyopia: Secondary | ICD-10-CM | POA: Diagnosis not present

## 2022-11-23 DIAGNOSIS — M25562 Pain in left knee: Secondary | ICD-10-CM | POA: Diagnosis not present

## 2022-11-23 DIAGNOSIS — Z961 Presence of intraocular lens: Secondary | ICD-10-CM | POA: Diagnosis not present

## 2022-11-25 DIAGNOSIS — M546 Pain in thoracic spine: Secondary | ICD-10-CM | POA: Diagnosis not present

## 2022-11-25 DIAGNOSIS — M25562 Pain in left knee: Secondary | ICD-10-CM | POA: Diagnosis not present

## 2022-11-25 DIAGNOSIS — M5459 Other low back pain: Secondary | ICD-10-CM | POA: Diagnosis not present

## 2022-11-26 ENCOUNTER — Telehealth: Payer: Self-pay | Admitting: Pharmacy Technician

## 2022-11-26 ENCOUNTER — Other Ambulatory Visit (HOSPITAL_COMMUNITY): Payer: Self-pay

## 2022-11-26 NOTE — Telephone Encounter (Signed)
Clinical questions answered and Prior authorization has been submitted.

## 2022-11-26 NOTE — Telephone Encounter (Signed)
A representative from [employer] called to say the PA for the medication was approved. The coverage date is 11/26/2022-11/26/2023. She said she will also be faxing it to [facility].

## 2022-11-26 NOTE — Telephone Encounter (Signed)
Pharmacy Patient Advocate Encounter   Received notification from CoverMyMeds that prior authorization for PARoxetine HCl 40MG  tablets is required/requested.   Insurance verification completed.   The patient is insured through [facility] .   Per test claim: PA required; PA started via CoverMyMeds. KEY [id number] . Waiting for clinical questions to populate.

## 2022-11-27 NOTE — Telephone Encounter (Signed)
Medication is for Paroxetine sent pt msg regarding approval../lmb

## 2022-12-02 DIAGNOSIS — M546 Pain in thoracic spine: Secondary | ICD-10-CM | POA: Diagnosis not present

## 2022-12-02 DIAGNOSIS — M5459 Other low back pain: Secondary | ICD-10-CM | POA: Diagnosis not present

## 2022-12-02 DIAGNOSIS — M25562 Pain in left knee: Secondary | ICD-10-CM | POA: Diagnosis not present

## 2022-12-03 ENCOUNTER — Other Ambulatory Visit: Payer: Self-pay | Admitting: Internal Medicine

## 2022-12-03 DIAGNOSIS — K080 Exfoliation of teeth due to systemic causes: Secondary | ICD-10-CM | POA: Diagnosis not present

## 2022-12-07 DIAGNOSIS — L308 Other specified dermatitis: Secondary | ICD-10-CM | POA: Diagnosis not present

## 2022-12-07 DIAGNOSIS — Z85828 Personal history of other malignant neoplasm of skin: Secondary | ICD-10-CM | POA: Diagnosis not present

## 2022-12-19 DIAGNOSIS — J189 Pneumonia, unspecified organism: Secondary | ICD-10-CM | POA: Diagnosis not present

## 2022-12-21 ENCOUNTER — Telehealth: Payer: Self-pay | Admitting: Internal Medicine

## 2022-12-21 NOTE — Telephone Encounter (Signed)
Pt called about needing a refill on oxyCODONE-acetaminophen (PERCOCET/ROXICET) 5-325 MG tablet [[id number]]

## 2022-12-22 NOTE — Telephone Encounter (Signed)
Notified pt w/MD response.../lmb 

## 2022-12-22 NOTE — Telephone Encounter (Signed)
Patient said she changed pharmacies. She is now using the [facility] [address] in [place]. She said she no longer uses [facility]. Best callback is [phone].

## 2022-12-22 NOTE — Telephone Encounter (Signed)
The prescription were issued for August and September at the time of her last visit.  Thank you

## 2022-12-23 DIAGNOSIS — M25562 Pain in left knee: Secondary | ICD-10-CM | POA: Diagnosis not present

## 2022-12-23 DIAGNOSIS — M5459 Other low back pain: Secondary | ICD-10-CM | POA: Diagnosis not present

## 2022-12-23 DIAGNOSIS — M546 Pain in thoracic spine: Secondary | ICD-10-CM | POA: Diagnosis not present

## 2022-12-24 NOTE — Telephone Encounter (Signed)
[facility] called needing that Rx sent to them so they can fill Rx for pt. Please advise.

## 2022-12-24 NOTE — Telephone Encounter (Signed)
Patient called back and requests that this be resent to the correct pharmacy.  [facility] [address].  Please let patient know when this has been sent.  [phone]

## 2022-12-25 MED ORDER — OXYCODONE-ACETAMINOPHEN 5-325 MG PO TABS: 1.0000 | ORAL_TABLET | Freq: Three times a day (TID) | ORAL | 0 refills | Status: DC | PRN

## 2022-12-25 NOTE — Telephone Encounter (Signed)
Okay.  Thanks.

## 2022-12-25 NOTE — Addendum Note (Signed)
Addended by: Tresa Garter on: 12/25/2022 07:24 AM   Modules accepted: Orders

## 2023-01-02 DIAGNOSIS — F411 Generalized anxiety disorder: Secondary | ICD-10-CM | POA: Diagnosis not present

## 2023-01-19 DIAGNOSIS — J189 Pneumonia, unspecified organism: Secondary | ICD-10-CM | POA: Diagnosis not present

## 2023-01-20 ENCOUNTER — Other Ambulatory Visit: Payer: Self-pay | Admitting: Internal Medicine

## 2023-01-29 ENCOUNTER — Telehealth: Payer: Self-pay | Admitting: Internal Medicine

## 2023-01-29 MED ORDER — OXYCODONE-ACETAMINOPHEN 5-325 MG PO TABS: 1.0000 | ORAL_TABLET | Freq: Three times a day (TID) | ORAL | 0 refills | Status: DC | PRN

## 2023-01-29 NOTE — Telephone Encounter (Signed)
Prescription Request  01/29/2023  LOV: 11/18/2022  What is the name of the medication or equipment? oxycodone  Have you contacted your pharmacy to request a refill? Yes   Which pharmacy would you like this sent to?  [facility] [id number] [place], [place] - [address] [place] [address] Phone: [phone] Fax: [phone]    Patient notified that their request is being sent to the clinical staff for review and that they should receive a response within 2 business days.   Please advise at Mobile [phone] (mobile)

## 2023-01-29 NOTE — Telephone Encounter (Signed)
4-day supply sent to pharmacy for patient so that she can have her medication through the weekend until her primary care provider can return to order additional refill if necessary.

## 2023-01-30 DIAGNOSIS — F411 Generalized anxiety disorder: Secondary | ICD-10-CM | POA: Diagnosis not present

## 2023-01-31 NOTE — Telephone Encounter (Signed)
[name] had a prescription for 90 tablets of Percocet at the pharmacy.  It was emailed in in August.  Summary: Take 1 tablet by mouth every 8 (eight) hours as needed for severe pain., Starting Fri 12/25/2022, Normal Dose, Route, Frequency: 1 tablet, Oral, Every 8 hours PRNDx Associated: End Date Warning: Different Encounter: Start Date & Time: 08/23/2024Ord/Sold: 12/25/2022 (O)Ordered On: 08/23/2024[id number] - [place], [place] - [address]      Note to Pharmacy: Please fill on or after 01/12/23  Did she need a prescription for October?  Thanks

## 2023-02-01 NOTE — Telephone Encounter (Signed)
Spoke to patient she stated that she didn't need the medication she had refills at the pharmacy that she didn't know about.

## 2023-02-15 ENCOUNTER — Other Ambulatory Visit: Payer: Medicare Other

## 2023-02-15 ENCOUNTER — Other Ambulatory Visit (INDEPENDENT_AMBULATORY_CARE_PROVIDER_SITE_OTHER): Payer: Medicare Other

## 2023-02-15 DIAGNOSIS — Z0000 Encounter for general adult medical examination without abnormal findings: Secondary | ICD-10-CM

## 2023-02-15 DIAGNOSIS — N183 Chronic kidney disease, stage 3 unspecified: Secondary | ICD-10-CM | POA: Diagnosis not present

## 2023-02-15 LAB — URINALYSIS, ROUTINE W REFLEX MICROSCOPIC
Bilirubin Urine: NEGATIVE
Hgb urine dipstick: NEGATIVE
Ketones, ur: NEGATIVE
Leukocytes,Ua: NEGATIVE
Nitrite: NEGATIVE
RBC / HPF: NONE SEEN (ref 0–?)
Specific Gravity, Urine: 1.01 (ref 1.000–1.030)
Total Protein, Urine: NEGATIVE
Urine Glucose: NEGATIVE
Urobilinogen, UA: 0.2 (ref 0.0–1.0)
WBC, UA: NONE SEEN (ref 0–?)
pH: 7 (ref 5.0–8.0)

## 2023-02-15 LAB — CBC WITH DIFFERENTIAL/PLATELET
Basophils Absolute: 0.1 10*3/uL (ref 0.0–0.1)
Basophils Relative: 1.5 % (ref 0.0–3.0)
Eosinophils Absolute: 0.2 10*3/uL (ref 0.0–0.7)
Eosinophils Relative: 3.8 % (ref 0.0–5.0)
HCT: 40.9 % (ref 36.0–46.0)
Hemoglobin: 13.2 g/dL (ref 12.0–15.0)
Lymphocytes Relative: 28.9 % (ref 12.0–46.0)
Lymphs Abs: 1.5 10*3/uL (ref 0.7–4.0)
MCHC: 32.4 g/dL (ref 30.0–36.0)
MCV: 86.1 fL (ref 78.0–100.0)
Monocytes Absolute: 0.3 10*3/uL (ref 0.1–1.0)
Monocytes Relative: 6.2 % (ref 3.0–12.0)
Neutro Abs: 3 10*3/uL (ref 1.4–7.7)
Neutrophils Relative %: 59.6 % (ref 43.0–77.0)
Platelets: 249 10*3/uL (ref 150.0–400.0)
RBC: 4.75 Mil/uL (ref 3.87–5.11)
RDW: 13.4 % (ref 11.5–15.5)
WBC: 5 10*3/uL (ref 4.0–10.5)

## 2023-02-15 LAB — LIPID PANEL
Cholesterol: 150 mg/dL (ref 0–200)
HDL: 63.1 mg/dL (ref >39.00)
LDL Cholesterol: 73 mg/dL (ref 0–99)
NonHDL: 86.6
Total CHOL/HDL Ratio: 2
Triglycerides: 67 mg/dL (ref 0.0–149.0)
VLDL: 13.4 mg/dL (ref 0.0–40.0)

## 2023-02-15 LAB — TSH: TSH: 1.64 u[IU]/mL (ref 0.35–5.50)

## 2023-02-15 NOTE — Addendum Note (Signed)
Addended by: Delsa Grana R on: 02/15/2023 02:24 PM   Modules accepted: Orders

## 2023-02-16 ENCOUNTER — Other Ambulatory Visit: Payer: Self-pay | Admitting: Internal Medicine

## 2023-02-17 ENCOUNTER — Encounter: Payer: Self-pay | Admitting: Internal Medicine

## 2023-02-17 ENCOUNTER — Ambulatory Visit: Payer: Medicare Other | Admitting: Internal Medicine

## 2023-02-17 VITALS — BP 120/80 | HR 60 | Temp 98.2°F | Ht 65.0 in | Wt 142.0 lb

## 2023-02-17 DIAGNOSIS — F419 Anxiety disorder, unspecified: Secondary | ICD-10-CM

## 2023-02-17 DIAGNOSIS — I5032 Chronic diastolic (congestive) heart failure: Secondary | ICD-10-CM

## 2023-02-17 DIAGNOSIS — R413 Other amnesia: Secondary | ICD-10-CM

## 2023-02-17 DIAGNOSIS — M5442 Lumbago with sciatica, left side: Secondary | ICD-10-CM

## 2023-02-17 DIAGNOSIS — M5441 Lumbago with sciatica, right side: Secondary | ICD-10-CM

## 2023-02-17 DIAGNOSIS — G8929 Other chronic pain: Secondary | ICD-10-CM

## 2023-02-17 DIAGNOSIS — Z23 Encounter for immunization: Secondary | ICD-10-CM

## 2023-02-17 MED ORDER — DIAZEPAM 5 MG PO TABS: ORAL_TABLET | ORAL | 3 refills | Status: DC

## 2023-02-17 MED ORDER — OXYCODONE-ACETAMINOPHEN 5-325 MG PO TABS: 1.0000 | ORAL_TABLET | Freq: Three times a day (TID) | ORAL | 0 refills | Status: DC | PRN

## 2023-02-17 NOTE — Assessment & Plan Note (Signed)
On O2 1l [place] at night

## 2023-02-17 NOTE — Progress Notes (Signed)
Subjective:  Patient ID: Bianca Haley, female    DOB: Oct 01, 1950  Age: 72 y.o. MRN: [id number]  CC: Follow-up (3 MNTH F/U)   HPI Bianca Haley presents for LBP, HTN, HAs, dyslipidemia Brett Canales died in Apr 22, 2023. On O2 at night  Outpatient Medications Prior to Visit  Medication Sig Dispense Refill   acetaminophen (TYLENOL) 650 MG CR tablet Take 1,300 mg by mouth every 8 (eight) hours as needed for pain.     albuterol (VENTOLIN HFA) 108 (90 Base) MCG/ACT inhaler INHALE 2 PUFFS INTO THE LUNGS EVERY 4 HOURS AS NEEDED FOR WHEEZING OR SHORTNESS OF BREATH 18 g 5   Ascorbic Acid (VITAMIN C PO) Take 1 tablet by mouth every morning.     Calcium Carb-Cholecalciferol (CALCIUM PLUS VITAMIN D3) 600-500 MG-UNIT CAPS Take 2 tablets by mouth every morning.     Cholecalciferol (VITAMIN D) 2000 units tablet Take 2,000 Units by mouth at bedtime.      cycloSPORINE (RESTASIS) 0.05 % ophthalmic emulsion Place 1 drop into both eyes every morning.     diltiazem (CARDIZEM CD) 240 MG 24 hr capsule Take 1 capsule (240 mg total) by mouth every morning. 90 capsule 1   ELIQUIS 5 MG TABS tablet TAKE 1 TABLET BY MOUTH TWO TIMES A DAY 60 tablet 5   fluticasone (FLONASE) 50 MCG/ACT nasal spray Place 2 sprays into both nostrils daily as needed for allergies or rhinitis. 16 g 5   hydrocortisone 2.5 % cream Apply twice daily for up to one week as needed for flares 30 g 2   irbesartan (AVAPRO) 150 MG tablet Take 1 tablet (150 mg total) by mouth daily. 30 tablet 11   metoprolol tartrate (LOPRESSOR) 50 MG tablet TAKE 1 TABLET(50 MG) BY MOUTH TWICE DAILY 60 tablet 11   mirabegron ER (MYRBETRIQ) 50 MG TB24 tablet Take 1 tablet (50 mg total) by mouth every morning. 30 tablet 5   Multiple Vitamin (MULTIVITAMIN WITH MINERALS) TABS tablet Take 1 tablet by mouth 2 (two) times daily.     naloxone (NARCAN) nasal spray 4 mg/0.1 mL 1 actuation in one nostril once. May repeat in 2-3 min 1 each 2   OVER THE COUNTER MEDICATION Take 2 capsules  by mouth in the morning. [place]     pantoprazole (PROTONIX) 40 MG tablet TAKE 1 TABLET BY MOUTH TWICE DAILY( BEFORE A MEAL) 60 tablet 11   PARoxetine (PAXIL) 40 MG tablet TAKE 1 TABLET BY MOUTH TWICE A DAY 60 tablet 5   potassium chloride (KLOR-CON) 10 MEQ tablet TAKE 1 TABLET BY MOUTH DAILY 90 tablet 3   Probiotic CAPS Take 1 capsule by mouth every morning.     rosuvastatin (CRESTOR) 5 MG tablet Take 1 tablet (5 mg total) by mouth daily. 90 tablet 1   SUMAtriptan (IMITREX) 100 MG tablet TAKE 1 TABLET BY MOUTH ONCE. MAY REPEAT IN 2 HOURS IF HEADACHE PERSISTS OR RECURS 9 tablet 3   traZODone (DESYREL) 150 MG tablet TAKE 1 TABLET BY MOUTH EVERY NIGHT AT BEDTIME AS NEEDED FOR SLEEP 30 tablet 5   vitamin B-12 (CYANOCOBALAMIN) 1000 MCG tablet Take 1,000 mcg by mouth every morning.     diazepam (VALIUM) 5 MG tablet Take 2.5 mg bid. Ok to take 2.5 mg in mid-day if needed. 30 tablet 3   oxyCODONE-acetaminophen (PERCOCET/ROXICET) 5-325 MG tablet Take 1 tablet by mouth every 8 (eight) hours as needed for severe pain. 90 tablet 0   oxyCODONE-acetaminophen (PERCOCET/ROXICET) 5-325 MG tablet Take 1  tablet by mouth every 8 (eight) hours as needed for severe pain. 12 tablet 0   No facility-administered medications prior to visit.    ROS: Review of Systems  Constitutional:  Negative for activity change, appetite change, chills, fatigue and unexpected weight change.  HENT:  Negative for congestion, mouth sores and sinus pressure.   Eyes:  Negative for visual disturbance.  Respiratory:  Negative for cough and chest tightness.   Gastrointestinal:  Negative for abdominal pain and nausea.  Genitourinary:  Negative for difficulty urinating, frequency and vaginal pain.  Musculoskeletal:  Positive for back pain. Negative for gait problem.  Skin:  Negative for pallor and rash.  Neurological:  Negative for dizziness, tremors, weakness, numbness and headaches.  Psychiatric/Behavioral:  Negative for confusion and  sleep disturbance. The patient is nervous/anxious.     Objective:  BP 120/80 (BP Location: Left Arm, Patient Position: Sitting, Cuff Size: Normal)   Pulse 60   Temp 98.2 F (36.8 C) (Oral)   Ht 5\' 5"  (1.651 m)   Wt 142 lb (64.4 kg)   SpO2 94%   BMI 23.63 kg/m   BP Readings from Last 3 Encounters:  02/17/23 120/80  11/18/22 120/78  08/18/22 120/78    Wt Readings from Last 3 Encounters:  02/17/23 142 lb (64.4 kg)  11/18/22 144 lb 9.6 oz (65.6 kg)  08/18/22 148 lb (67.1 kg)    Physical Exam Constitutional:      General: She is not in acute distress.    Appearance: She is well-developed.  HENT:     Head: Normocephalic.     Right Ear: External ear normal.     Left Ear: External ear normal.     Nose: Nose normal.  Eyes:     General:        Right eye: No discharge.        Left eye: No discharge.     Conjunctiva/sclera: Conjunctivae normal.     Pupils: Pupils are equal, round, and reactive to light.  Neck:     Thyroid: No thyromegaly.     Vascular: No JVD.     Trachea: No tracheal deviation.  Cardiovascular:     Rate and Rhythm: Normal rate and regular rhythm.     Heart sounds: Normal heart sounds.  Pulmonary:     Effort: No respiratory distress.     Breath sounds: No stridor. No wheezing.  Abdominal:     General: Bowel sounds are normal. There is no distension.     Palpations: Abdomen is soft. There is no mass.     Tenderness: There is no abdominal tenderness. There is no guarding or rebound.  Musculoskeletal:        General: No tenderness.     Cervical back: Normal range of motion and neck supple. No rigidity.  Lymphadenopathy:     Cervical: No cervical adenopathy.  Skin:    Findings: No erythema or rash.  Neurological:     Cranial Nerves: No cranial nerve deficit.     Motor: No abnormal muscle tone.     Coordination: Coordination normal.     Deep Tendon Reflexes: Reflexes normal.  Psychiatric:        Behavior: Behavior normal.        Thought Content:  Thought content normal.        Judgment: Judgment normal.     Lab Results  Component Value Date   WBC 5.0 02/15/2023   HGB 13.2 02/15/2023   HCT 40.9 02/15/2023  PLT 249.0 02/15/2023   GLUCOSE 90 03/11/2022   CHOL 150 02/15/2023   TRIG 67.0 02/15/2023   HDL 63.10 02/15/2023   LDLCALC 73 02/15/2023   ALT 9 03/11/2022   AST 20 03/11/2022   NA 140 03/11/2022   K 4.3 03/11/2022   CL 100 03/11/2022   CREATININE 0.80 03/11/2022   BUN 21 03/11/2022   CO2 36 (H) 03/11/2022   TSH 1.64 02/15/2023   INR 1.0 09/21/2018   HGBA1C 6.1 06/23/2018    CT Chest Wo Contrast  Result Date: 09/10/2022 CLINICAL DATA:  Follow-up pneumonia. EXAM: CT CHEST WITHOUT CONTRAST TECHNIQUE: Multidetector CT imaging of the chest was performed following the standard protocol without IV contrast. RADIATION DOSE REDUCTION: This exam was performed according to the departmental dose-optimization program which includes automated exposure control, adjustment of the mA and/or kV according to patient size and/or use of iterative reconstruction technique. COMPARISON:  10/05/2021 chest CT angiogram. FINDINGS: Cardiovascular: Normal heart size. No significant pericardial effusion/thickening. Atherosclerotic nonaneurysmal thoracic aorta. Normal caliber pulmonary arteries. Mediastinum/Nodes: No significant thyroid nodules. Unremarkable esophagus. No axillary adenopathy. Mildly enlarged 1.5 cm lower right paratracheal node (series 3/image 59), mildly decreased from 1.8 cm. No additional pathologically enlarged mediastinal nodes. No discrete hilar adenopathy on these noncontrast images. Lungs/Pleura: No pneumothorax. No pleural effusion. No acute consolidative airspace disease or lung masses. Widespread patchy regions of mild-to-moderate cylindrical bronchiectasis with diffuse bronchial wall thickening and associated patchy tree-in-bud opacities and clustered centrilobular solid nodules in both lungs involving all lung lobes, most  prominent in the basilar right upper lobe, right middle lobe and lingula. The previously visualized patchy regions of consolidation in the mid to lower lungs on 10/05/2021 chest CT angiogram study have resolved. Bandlike postinfectious scarring in inferior lingula is similar. Representative solid 0.9 cm posterior basilar right lower lobe nodule (series 4/image 127), previously obscured by consolidation. The bronchiectasis and tree-in-bud opacities are mildly worsened in the interval. Upper abdomen: Small hiatal hernia. Musculoskeletal: No aggressive appearing focal osseous lesions. Marked thoracic spondylosis with chronic exaggerated upper thoracic kyphosis. IMPRESSION: 1. Widespread mild-to-moderate cylindrical bronchiectasis with diffuse bronchial wall thickening and associated patchy tree-in-bud opacities and clustered centrilobular solid nodules, mildly worsened in the interval. Findings are most compatible with a chronic infectious bronchiolitis such as due to atypical mycobacterial infection (MAI). Suggest follow-up high-resolution chest CT in 6 months to reassess the nodularity. 2. Previously visualized patchy regions of consolidation in the mid to lower lungs on 10/05/2021 chest CT angiogram study have resolved. 3. Mild mediastinal lymphadenopathy, mildly decreased, most compatible with benign reactive adenopathy. 4. Small hiatal hernia. 5.  Aortic Atherosclerosis (ICD10-I70.0). Electronically Signed   By: Delbert Phenix M.D.   On: 09/10/2022 12:00    Assessment & Plan:   Problem List Items Addressed This Visit     Memory loss    Multifactorial  Improved      Anxiety disorder - Primary    On Paxil 40 mg BID for many years Cont w/Paxil. On Paxil 40 mg BID for many years Lunesta prn - Dr Jonny Ruiz prescribed Brett Canales died in 2023-04-09       Relevant Medications   diazepam (VALIUM) 5 MG tablet   Chronic diastolic CHF (congestive heart failure) (HCC)    On O2 1l [place] at night      Low back pain     Chronic Percocet prn  Potential benefits of a long term opioids use as well as potential risks (i.e. addiction risk, apnea etc) and complications (i.e.  Somnolence, constipation and others) were explained to the patient and were aknowledged. A compound cream Rx      Relevant Medications   oxyCODONE-acetaminophen (PERCOCET/ROXICET) 5-325 MG tablet   oxyCODONE-acetaminophen (PERCOCET/ROXICET) 5-325 MG tablet      Meds ordered this encounter  Medications   diazepam (VALIUM) 5 MG tablet    Sig: Take 2.5 mg bid. Ok to take 2.5 mg in mid-day if needed.    Dispense:  30 tablet    Refill:  3   oxyCODONE-acetaminophen (PERCOCET/ROXICET) 5-325 MG tablet    Sig: Take 1 tablet by mouth every 8 (eight) hours as needed for severe pain (pain score 7-10).    Dispense:  90 tablet    Refill:  0    Please fill on or after 02/27/23   oxyCODONE-acetaminophen (PERCOCET/ROXICET) 5-325 MG tablet    Sig: Take 1 tablet by mouth every 8 (eight) hours as needed for severe pain (pain score 7-10).    Dispense:  12 tablet    Refill:  0    Please fill on or after 03/30/23      Follow-up: Return in about 2 months (around 04/19/2023) for a follow-up visit.  Sonda Primes, MD

## 2023-02-17 NOTE — Assessment & Plan Note (Addendum)
On Paxil 40 mg BID for many years Cont w/Paxil. On Paxil 40 mg BID for many years Lunesta prn - Dr Jonny Ruiz prescribed Brett Canales died in 04-21-2023

## 2023-02-17 NOTE — Assessment & Plan Note (Signed)
Chronic Percocet prn  Potential benefits of a long term opioids use as well as potential risks (i.e. addiction risk, apnea etc) and complications (i.e. Somnolence, constipation and others) were explained to the patient and were aknowledged. A compound cream Rx

## 2023-02-17 NOTE — Addendum Note (Signed)
Addended by: Delsa Grana R on: 02/17/2023 02:25 PM   Modules accepted: Orders

## 2023-02-17 NOTE — Assessment & Plan Note (Signed)
Multifactorial  ?Improved ?

## 2023-02-18 DIAGNOSIS — J189 Pneumonia, unspecified organism: Secondary | ICD-10-CM | POA: Diagnosis not present

## 2023-03-01 ENCOUNTER — Other Ambulatory Visit: Payer: Self-pay | Admitting: Internal Medicine

## 2023-03-01 NOTE — Telephone Encounter (Signed)
Prescription Request   10.28.24   LOV: 08/12/2022   What is the name of the medication or equipment? traZODone (DESYREL) 150 MG tablet    Have you contacted your pharmacy to request a refill? Yes    Which pharmacy would you like this sent to?  [facility] [id number] [place], [place] - [address] [place] [address] Phone: [phone] Fax: [phone]     Patient notified that their request is being sent to the clinical staff for review and that they should receive a response within 2 business days.    Please advise at Mobile [phone] (mobile)

## 2023-03-16 ENCOUNTER — Other Ambulatory Visit: Payer: Self-pay | Admitting: Internal Medicine

## 2023-03-18 ENCOUNTER — Ambulatory Visit
Admission: RE | Admit: 2023-03-18 | Discharge: 2023-03-18 | Disposition: A | Discharge status: Home / Self Care | Payer: Medicare Other | Source: Ambulatory Visit | Attending: Gynecology | Admitting: Gynecology

## 2023-03-18 DIAGNOSIS — M8588 Other specified disorders of bone density and structure, other site: Secondary | ICD-10-CM | POA: Diagnosis not present

## 2023-03-18 DIAGNOSIS — E2839 Other primary ovarian failure: Secondary | ICD-10-CM | POA: Diagnosis not present

## 2023-03-18 DIAGNOSIS — N958 Other specified menopausal and perimenopausal disorders: Secondary | ICD-10-CM | POA: Diagnosis not present

## 2023-03-18 DIAGNOSIS — Z13820 Encounter for screening for osteoporosis: Secondary | ICD-10-CM

## 2023-03-21 ENCOUNTER — Other Ambulatory Visit: Payer: Self-pay | Admitting: Internal Medicine

## 2023-03-21 DIAGNOSIS — J189 Pneumonia, unspecified organism: Secondary | ICD-10-CM | POA: Diagnosis not present

## 2023-04-20 DIAGNOSIS — J189 Pneumonia, unspecified organism: Secondary | ICD-10-CM | POA: Diagnosis not present

## 2023-05-03 ENCOUNTER — Other Ambulatory Visit: Payer: Self-pay | Admitting: Internal Medicine

## 2023-05-03 ENCOUNTER — Telehealth: Payer: Self-pay | Admitting: Internal Medicine

## 2023-05-03 NOTE — Telephone Encounter (Signed)
Copied from CRM [id number]. Topic: Clinical - Medication Refill >> May 03, 2023  2:38 PM Truddie Crumble wrote: Most Recent Primary Care Visit:  Provider: Tresa Garter  Department: [facility]  Visit Type: OFFICE VISIT  Date: 02/17/2023  Medication: oxyCODONE-acetaminophen  Has the patient contacted their pharmacy? No (Agent: If no, request that the patient contact the pharmacy for the refill. If patient does not wish to contact the pharmacy document the reason why and proceed with request.) (Agent: If yes, when and what did the pharmacy advise?)  Is this the correct pharmacy for this prescription? Yes If no, delete pharmacy and type the correct one.  This is the patient's preferred pharmacy:  [facility] [id number] [place], [place] - [address] [place] [address] Phone: [phone] Fax: [phone]   Has the prescription been filled recently? yes  Is the patient out of the medication? Yes  Has the patient been seen for an appointment in the last year OR does the patient have an upcoming appointment? Yes  Can we respond through MyChart? Yes  Agent: Please be advised that Rx refills may take up to 3 business days. We ask that you follow-up with your pharmacy.

## 2023-05-04 ENCOUNTER — Ambulatory Visit (HOSPITAL_COMMUNITY): Payer: Medicare Other

## 2023-05-04 MED ORDER — OXYCODONE-ACETAMINOPHEN 5-325 MG PO TABS: 1.0000 | ORAL_TABLET | Freq: Three times a day (TID) | ORAL | 0 refills | Status: DC | PRN

## 2023-05-04 NOTE — Telephone Encounter (Signed)
OK. Thx

## 2023-05-06 ENCOUNTER — Other Ambulatory Visit: Payer: Self-pay | Admitting: Internal Medicine

## 2023-05-07 ENCOUNTER — Ambulatory Visit (INDEPENDENT_AMBULATORY_CARE_PROVIDER_SITE_OTHER): Payer: Medicare Other

## 2023-05-07 ENCOUNTER — Other Ambulatory Visit: Payer: Self-pay | Admitting: Nurse Practitioner

## 2023-05-07 ENCOUNTER — Telehealth: Payer: Self-pay

## 2023-05-07 VITALS — BP 140/72 | HR 58 | Ht 65.5 in | Wt 143.8 lb

## 2023-05-07 DIAGNOSIS — Z0000 Encounter for general adult medical examination without abnormal findings: Secondary | ICD-10-CM

## 2023-05-07 MED ORDER — IRBESARTAN 150 MG PO TABS: 150.0000 mg | ORAL_TABLET | Freq: Every day | ORAL | 0 refills | Status: DC

## 2023-05-07 NOTE — Patient Instructions (Signed)
 Ms. Smiddy , Thank you for taking time to come for your Medicare Wellness Visit. I appreciate your ongoing commitment to your health goals. Please review the following plan we discussed and let me know if I can assist you in the future.   Referrals/Orders/Follow-Ups/Clinician Recommendations: You are due for a colonoscopy.  Remember to discuss getting the Cologuard, with Dr. Garald.  It was nice to meet you today.    This is a list of the screening recommended for you and due dates:  Health Maintenance  Topic Date Due   COVID-19 Vaccine (4 - 2024-25 season) 05/23/2023*   Colon Cancer Screening  09/01/2023*   Medicare Annual Wellness Visit  05/06/2024   Mammogram  08/25/2024   DTaP/Tdap/Td vaccine (3 - Td or Tdap) 06/23/2028   Pneumonia Vaccine  Completed   Flu Shot  Completed   DEXA scan (bone density measurement)  Completed   Hepatitis C Screening  Completed   HPV Vaccine  Aged Out   Zoster (Shingles) Vaccine  Discontinued  *Topic was postponed. The date shown is not the original due date.    Advanced directives: (Declined) Advance directive discussed with you today. Even though you declined this today, please call our office should you change your mind, and we can give you the proper paperwork for you to fill out.  Next Medicare Annual Wellness Visit scheduled for next year: Yes

## 2023-05-07 NOTE — Telephone Encounter (Signed)
 Patient is also requesting a refill today on Irbesartan 150 mg.

## 2023-05-07 NOTE — Progress Notes (Signed)
 Subjective:   Bianca Haley is a 73 y.o. female who presents for Medicare Annual (Subsequent) preventive examination.  Visit Complete: In person  Patient Medicare AWV questionnaire was completed by the patient on 05/04/23; I have confirmed that all information answered by patient is correct and no changes since this date.  Cardiac Risk Factors include: hypertension;advanced age (>47men, >37 women);Other (see comment), Risk factor comments: A-Fib, CHF ,Atherosclerosis of aorta, AKI     Objective:    Today's Vitals   05/07/23 1527  BP: (!) 140/72  Pulse: (!) 58  SpO2: 96%  Weight: 143 lb 12.8 oz (65.2 kg)  Height: 5' 5.5 (1.664 m)   Body mass index is 23.57 kg/m.     05/07/2023    1:56 PM 05/06/2022    1:10 PM 10/04/2021    5:44 PM 09/02/2021   12:49 PM 03/10/2021    8:05 AM 03/09/2021    3:37 PM 07/25/2020    5:24 PM  Advanced Directives  Does Patient Have a Medical Advance Directive? Yes Yes No No No No No  Type of Estate Agent of [place];Living will Healthcare Power of [place];Living will       Copy of Healthcare Power of Attorney in Chart? No - copy requested No - copy requested       Would patient like information on creating a medical advance directive?   No - Patient declined No - Patient declined No - Patient declined  No - Patient declined    Current Medications (verified) Outpatient Encounter Medications as of 05/07/2023  Medication Sig   acetaminophen  (TYLENOL ) 650 MG CR tablet Take 1,300 mg by mouth every 8 (eight) hours as needed for pain.   albuterol  (VENTOLIN  HFA) 108 (90 Base) MCG/ACT inhaler INHALE 2 PUFFS INTO THE LUNGS EVERY 4 HOURS AS NEEDED FOR WHEEZING OR SHORTNESS OF BREATH   Ascorbic Acid (VITAMIN C PO) Take 1 tablet by mouth every morning.   Calcium  Carb-Cholecalciferol  (CALCIUM  PLUS VITAMIN D3) 600-500 MG-UNIT CAPS Take 2 tablets by mouth every morning.   Cholecalciferol  (VITAMIN D ) 2000 units tablet Take 2,000 Units by mouth at  bedtime.    cycloSPORINE  (RESTASIS ) 0.05 % ophthalmic emulsion Place 1 drop into both eyes every morning.   diazepam  (VALIUM ) 5 MG tablet TAKE 1/2 TABLET BY MOUTH TWICE DAILY. OKAY TO TAKE 1/2 TABLET IN MID-DAY IF NEEDED   diltiazem  (CARDIZEM  CD) 240 MG 24 hr capsule TAKE 1 CAPSULE BY MOUTH EVERY MORNING   ELIQUIS  5 MG TABS tablet TAKE 1 TABLET BY MOUTH TWO TIMES A DAY   fluticasone  (FLONASE ) 50 MCG/ACT nasal spray Place 2 sprays into both nostrils daily as needed for allergies or rhinitis.   hydrocortisone  2.5 % cream Apply twice daily for up to one week as needed for flares   irbesartan  (AVAPRO ) 150 MG tablet TAKE 1 TABLET BY MOUTH DAILY   metoprolol  tartrate (LOPRESSOR ) 50 MG tablet TAKE 1 TABLET(50 MG) BY MOUTH TWICE DAILY   Multiple Vitamin (MULTIVITAMIN WITH MINERALS) TABS tablet Take 1 tablet by mouth 2 (two) times daily.   MYRBETRIQ  50 MG TB24 tablet TAKE 1 TABLET BY MOUTH EVERY MORNING   naloxone  (NARCAN ) nasal spray 4 mg/0.1 mL 1 actuation in one nostril once. May repeat in 2-3 min   OVER THE COUNTER MEDICATION Take 2 capsules by mouth in the morning. [place]   oxyCODONE -acetaminophen  (PERCOCET/ROXICET) 5-325 MG tablet Take 1 tablet by mouth every 8 (eight) hours as needed for severe pain (pain score  7-10).   pantoprazole  (PROTONIX ) 40 MG tablet TAKE 1 TABLET BY MOUTH TWICE DAILY( BEFORE A MEAL)   PARoxetine  (PAXIL ) 40 MG tablet TAKE 1 TABLET BY MOUTH 2 TIMES A DAY   potassium chloride  (KLOR-CON ) 10 MEQ tablet TAKE 1 TABLET BY MOUTH DAILY   Probiotic CAPS Take 1 capsule by mouth every morning.   rosuvastatin  (CRESTOR ) 5 MG tablet Take 1 tablet (5 mg total) by mouth daily.   SUMAtriptan  (IMITREX ) 100 MG tablet TAKE 1 TABLET BY MOUTH ONCE. MAY REPEAT IN 2 HOURS IF HEADACHE PERSISTS OR RECURS   traZODone  (DESYREL ) 150 MG tablet TAKE 1 TABLET BY MOUTH EVERY NIGHT AT BEDTIME AS NEEDED FOR SLEEP   vitamin B-12 (CYANOCOBALAMIN ) 1000 MCG tablet Take 1,000 mcg by mouth every morning.    oxyCODONE -acetaminophen  (PERCOCET/ROXICET) 5-325 MG tablet Take 1 tablet by mouth every 8 (eight) hours as needed for severe pain (pain score 7-10).   No facility-administered encounter medications on file as of 05/07/2023.    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   Hemorrhoids    History of colon polyps    History of endometriosis    and fibroids   History of hiatal hernia    History of UTI    Hypertension    Memory loss    Migraine    OSA (obstructive sleep apnea)    on nocturnal O2   Recurrent pneumonia 07/2018   Seasonal allergies    Vitamin D  deficiency    Wears glasses    Past Surgical History:  Procedure Laterality Date   ABDOMINAL HYSTERECTOMY     BACK SURGERY     BIOPSY  10/20/2018   Procedure: BIOPSY;  Surgeon: Teressa Toribio SQUIBB, MD;  Location: [facility];  Service: Endoscopy;;   BLADDER SUSPENSION     COLONOSCOPY  02/16/2008   normal   DENTAL SURGERY     ESOPHAGOGASTRODUODENOSCOPY (EGD) WITH PROPOFOL  N/A 10/20/2018   Procedure: ESOPHAGOGASTRODUODENOSCOPY (EGD) WITH PROPOFOL ;  Surgeon: Teressa Toribio SQUIBB, MD;  Location: [facility];  Service: Endoscopy;  Laterality: N/A;   EUS N/A 10/20/2018   Procedure: UPPER ENDOSCOPIC ULTRASOUND (EUS) RADIAL;  Surgeon: Teressa Toribio SQUIBB, MD;  Location: [facility];  Service: Endoscopy;  Laterality: N/A;   FOOT SURGERY Left    x3   laparoscopy     for evaluation of endometriosis   LEFT HEART CATH AND CORONARY ANGIOGRAPHY N/A 09/04/2021   Procedure: LEFT HEART CATH AND CORONARY ANGIOGRAPHY;  Surgeon: Dann Candyce RAMAN, MD;  Location: [facility] LAB;  Service: Cardiovascular;  Laterality: N/A;   TOTAL KNEE ARTHROPLASTY Left 07/15/2015   Procedure: TOTAL LEFT KNEE ARTHROPLASTY;  Surgeon: Donnice Car, MD;  Location: [facility];  Service: Orthopedics;  Laterality: Left;   UPPER GI ENDOSCOPY     Family History  Problem Relation Age of Onset   Emphysema Mother    Heart disease Mother 30       MI   Social History  Socioeconomic History   Marital status: Widowed    Spouse name: Not on file   Number of children: 2   Years of education: 16   Highest education level: Bachelor's degree (e.g., BA, AB, BS)  Occupational History   Occupation: network engineer - retired  Tobacco Use   Smoking status: Never   Smokeless tobacco: Never  Vaping Use   Vaping status: Never Used  Substance and Sexual Activity   Alcohol  use: No    Alcohol /week: 0.0 standard drinks of alcohol     Comment: As of 07/2018, it is been several years since she drank any alcohol .   Drug use: No   Sexual activity: Yes    Partners: Male    Birth control/protection: Surgical  Other Topics Concern   Not on file  Social History Narrative   HSG, [employer] BS interior design. Married Apr 23, 1978 - 20 years, her husband died in 23-Apr-1998.  She has been with her current, second husband since around 04/23/09.  1 dtr - 1983/04/24 - lawyer, 1 son 04-23-88. No h/o abuse.   Lives alone.   Social Drivers of Corporate Investment Banker Strain: Low Risk  (05/07/2023)   Overall Financial Resource Strain (CARDIA)    Difficulty of Paying Living Expenses: Not hard at all  Food Insecurity: No Food Insecurity (05/07/2023)   Hunger Vital Sign    Worried About Running Out of Food in the Last Year: Never true    Ran Out of Food in the Last Year: Never true  Transportation Needs: No Transportation Needs (05/07/2023)   PRAPARE - Administrator, Civil Service (Medical): No    Lack of Transportation (Non-Medical): No  Physical Activity: Insufficiently Active (05/07/2023)    Exercise Vital Sign    Days of Exercise per Week: 2 days    Minutes of Exercise per Session: 30 min  Stress: No Stress Concern Present (05/07/2023)   [employer] of Occupational Health - Occupational Stress Questionnaire    Feeling of Stress : Not at all  Recent Concern: Stress - Stress Concern Present (02/16/2023)   [employer] of Occupational Health - Occupational Stress Questionnaire    Feeling of Stress : To some extent  Social Connections: Moderately Isolated (05/07/2023)   Social Connection and Isolation Panel [NHANES]    Frequency of Communication with Friends and Family: More than three times a week    Frequency of Social Gatherings with Friends and Family: Three times a week    Attends Religious Services: 1 to 4 times per year    Active Member of Clubs or Organizations: No    Attends Banker Meetings: Never    Marital Status: Widowed    Tobacco Counseling Counseling given: Not Answered   Clinical Intake:  Pre-visit preparation completed: Yes  Pain : No/denies pain     BMI - recorded: 23.57 Nutritional Status: BMI of 19-24  Normal Nutritional Risks: None Diabetes: No  How often do you need to have someone help you when you read instructions, pamphlets, or other written materials from your doctor or pharmacy?: 1 - Never  Interpreter Needed?: No  Information entered by :: Bianca Haley, RMA   Activities of Daily Living    05/04/2023   12:23 PM  In your present state of health, do you have any difficulty performing the following activities:  Hearing? 0  Vision? 0  Difficulty concentrating or making decisions? 0  Walking or climbing stairs? 1  Dressing or bathing? 0  Doing errands,  shopping? 0  Preparing Food and eating ? N  Using the Toilet? N  In the past six months, have you accidently leaked urine? Y  Do you have problems with loss of bowel control? N  Managing your Medications? N  Managing your Finances? N  Housekeeping or  managing your Housekeeping? N    Patient Care Team: Plotnikov, Karlynn GAILS, MD as PCP - General (Internal Medicine) Pietro Redell RAMAN, MD as PCP - Cardiology (Cardiology) Jude Harden GAILS, MD as Consulting Physician (Pulmonary Disease) Mindi Mt, MD (Inactive) as Consulting Physician (Anesthesiology) Ernie Cough, MD as Consulting Physician (Orthopedic Surgery) Buck Saucer, MD as Attending Physician (Neurology) Darlis Deatrice RAMAN, MD as Consulting Physician (Pain Medicine) Duke, Jon Garre, PA as Physician Assistant (Cardiology) Cleatus Collar, MD as Consulting Physician (Ophthalmology) [facility] as Consulting Physician (Obstetrics and Gynecology)  Indicate any recent Medical Services you may have received from other than [facility] providers in the past year (date may be approximate).     Assessment:   This is a routine wellness examination for Bianca Haley.  Hearing/Vision screen Hearing Screening - Comments:: Denies hearing difficulties   Vision Screening - Comments:: Wears eyeglasses   Goals Addressed   None   Depression Screen    05/07/2023    2:02 PM 02/17/2023    1:18 PM 11/18/2022    1:20 PM 08/12/2022    2:27 PM 07/24/2022    2:42 PM 05/12/2022    1:37 PM 05/06/2022    1:15 PM  PHQ 2/9 Scores  PHQ - 2 Score 0 0 0 0 0 0 0  PHQ- 9 Score 2     2 1     Fall Risk    05/04/2023   12:23 PM 02/17/2023    1:18 PM 11/18/2022    1:20 PM 08/12/2022    2:27 PM 07/24/2022    2:42 PM  Fall Risk   Falls in the past year? 0 0 0 0 0  Number falls in past yr:  0 0 0 0  Injury with Fall? 0 0 0 0 0  Risk for fall due to : No Fall Risks No Fall Risks No Fall Risks No Fall Risks No Fall Risks  Follow up Falls evaluation completed;Falls prevention discussed Falls evaluation completed Falls evaluation completed Falls evaluation completed Falls evaluation completed    MEDICARE RISK AT HOME: Medicare Risk at Home Any stairs in or around the home?: (Patient-Rptd) No If so,  are there any without handrails?: (Patient-Rptd) No Home free of loose throw rugs in walkways, pet beds, electrical cords, etc?: (Patient-Rptd) Yes Adequate lighting in your home to reduce risk of falls?: (Patient-Rptd) Yes Life alert?: (Patient-Rptd) No Use of a cane, walker or w/c?: (Patient-Rptd) No Grab bars in the bathroom?: (Patient-Rptd) Yes Shower chair or bench in shower?: (Patient-Rptd) Yes Elevated toilet seat or a handicapped toilet?: (Patient-Rptd) No  TIMED UP AND GO:  Was the test performed?  Yes  Length of time to ambulate 10 feet: 20 sec Gait slow and steady without use of assistive device    Cognitive Function:    06/29/2019    1:21 PM 12/27/2018    1:10 PM 06/28/2018    1:03 PM  MMSE - Mini Mental State Exam  Orientation to time 4 5 4   Orientation to Place 5 5 5   Registration 3 3 3   Attention/ Calculation 5 2 5   Recall 3 3 3   Language- name 2 objects 2 2 2   Language- repeat 1 1 1  Language- follow 3 step command 3 3 3   Language- read & follow direction 1 1 1   Write a sentence 1 1 1   Copy design 1 1 1   Total score 29 27 29         05/07/2023    1:38 PM 05/06/2022    1:21 PM  6CIT Screen  What Year? 0 points 0 points  What month? 0 points 0 points  What time? 0 points 0 points  Count back from 20 0 points 0 points  Months in reverse 0 points 0 points  Repeat phrase 0 points 0 points  Total Score 0 points 0 points    Immunizations Immunization History  Administered Date(s) Administered   Fluad Quad(high Dose 65+) 01/30/2019, 04/03/2020, 02/07/2021, 03/11/2022   Fluad Trivalent(High Dose 65+) 02/17/2023   Influenza Split 02/27/2015   Influenza Whole 01/30/2009   Influenza, High Dose Seasonal PF 02/08/2017, 01/04/2018   Influenza, Seasonal, Injecte, Preservative Fre 02/27/2015   Influenza,inj,Quad PF,6+ Mos 01/26/2013, 01/26/2014, 02/21/2016   Influenza-Unspecified 01/26/2013, 01/26/2014, 02/21/2016, 02/09/2017   Moderna SARS-COV2 Booster Vaccination  04/05/2020   Moderna Sars-Covid-2 Vaccination 05/25/2019, 06/22/2019   Pneumococcal Conjugate-13 01/26/2013   Pneumococcal Polysaccharide-23 03/23/2013, 04/03/2020   Td 01/30/2009   Td (Adult), 2 Lf Tetanus Toxid, Preservative Free 01/30/2009   Tdap 06/23/2018   Zoster Recombinant(Shingrix) 02/07/2018    TDAP status: Up to date  Flu Vaccine status: Up to date  Pneumococcal vaccine status: Up to date  Covid-19 vaccine status: Completed vaccines  Qualifies for Shingles Vaccine? Yes   Zostavax completed Yes   Shingrix Completed?: No.    Education has been provided regarding the importance of this vaccine. Patient has been advised to call insurance company to determine out of pocket expense if they have not yet received this vaccine. Advised may also receive vaccine at local pharmacy or Health Dept. Verbalized acceptance and understanding.  Screening Tests Health Maintenance  Topic Date Due   Colonoscopy  04/13/2019   COVID-19 Vaccine (3 - Moderna risk series) 05/03/2020   MAMMOGRAM  08/23/2022   Medicare Annual Wellness (AWV)  05/06/2024   DTaP/Tdap/Td (3 - Td or Tdap) 06/23/2028   Pneumonia Vaccine 59+ Years old  Completed   INFLUENZA VACCINE  Completed   DEXA SCAN  Completed   Hepatitis C Screening  Completed   HPV VACCINES  Aged Out   Zoster Vaccines- Shingrix  Discontinued    Health Maintenance  Health Maintenance Due  Topic Date Due   Colonoscopy  04/13/2019   COVID-19 Vaccine (3 - Moderna risk series) 05/03/2020   MAMMOGRAM  08/23/2022     Mammogram status: Completed 08/26/2022. Repeat every year  Bone Density status: Completed 04/17/2023. Results reflect: Bone density results: OSTEOPOROSIS. Repeat every 2 years.  Lung Cancer Screening: (Low Dose CT Chest recommended if Age 71-80 years, 20 pack-year currently smoking OR have quit w/in 15years.) does not qualify.   Lung Cancer Screening Referral: N/A  Additional Screening:  Hepatitis C Screening: does  qualify; Completed 09/26/2015  Vision Screening: Recommended annual ophthalmology exams for early detection of glaucoma and other disorders of the eye. Is the patient up to date with their annual eye exam?  Yes  Who is the provider or what is the name of the office in which the patient attends annual eye exams? Dr. Cleatus If pt is not established with a provider, would they like to be referred to a provider to establish care? No .   Dental Screening: Recommended annual dental exams for  proper oral hygiene  Community Resource Referral / Chronic Care Management: CRR required this visit?  No   CCM required this visit?  No     Plan:     I have personally reviewed and noted the following in the patient's chart:   Medical and social history Use of alcohol , tobacco or illicit drugs  Current medications and supplements including opioid prescriptions. Patient is currently taking opioid prescriptions. Information provided to patient regarding non-opioid alternatives. Patient advised to discuss non-opioid treatment plan with their provider. Functional ability and status Nutritional status Physical activity Advanced directives List of other physicians Hospitalizations, surgeries, and ER visits in previous 12 months Vitals Screenings to include cognitive, depression, and falls Referrals and appointments  In addition, I have reviewed and discussed with patient certain preventive protocols, quality metrics, and best practice recommendations. A written personalized care plan for preventive services as well as general preventive health recommendations were provided to patient.     Bianca Haley, CMA   05/07/2023   After Visit Summary: (MyChart) Due to this being a telephonic visit, the after visit summary with patients personalized plan was offered to patient via MyChart   Nurse Notes: Patient is due for a colonoscopy, however she would like to discuss the Cologuard with Dr. Garald during  her next visit in 2 weeks.  She is also requesting a refill today on Irbesartan  150 mg.  Patient stated that she has had a mammogram recently with GYN on 08/26/22.  Patient had concerns about a slight feeling of needle sticks on both sides of her temples at times.  She stated that she will discuss during her up coming visit.  Patient had no other concerns to address today.

## 2023-05-19 ENCOUNTER — Ambulatory Visit: Payer: Medicare Other | Admitting: Internal Medicine

## 2023-05-19 VITALS — BP 110/70 | HR 59 | Temp 98.3°F | Ht 65.5 in | Wt 141.0 lb

## 2023-05-19 DIAGNOSIS — I480 Paroxysmal atrial fibrillation: Secondary | ICD-10-CM | POA: Diagnosis not present

## 2023-05-19 DIAGNOSIS — I10 Essential (primary) hypertension: Secondary | ICD-10-CM

## 2023-05-19 DIAGNOSIS — M5416 Radiculopathy, lumbar region: Secondary | ICD-10-CM | POA: Diagnosis not present

## 2023-05-19 DIAGNOSIS — M545 Low back pain, unspecified: Secondary | ICD-10-CM

## 2023-05-19 DIAGNOSIS — F419 Anxiety disorder, unspecified: Secondary | ICD-10-CM | POA: Diagnosis not present

## 2023-05-19 DIAGNOSIS — N183 Chronic kidney disease, stage 3 unspecified: Secondary | ICD-10-CM

## 2023-05-19 DIAGNOSIS — G8929 Other chronic pain: Secondary | ICD-10-CM

## 2023-05-19 MED ORDER — APIXABAN 5 MG PO TABS: 5.0000 mg | ORAL_TABLET | Freq: Two times a day (BID) | ORAL | 3 refills | Status: DC

## 2023-05-19 MED ORDER — OXYCODONE-ACETAMINOPHEN 5-325 MG PO TABS: 1.0000 | ORAL_TABLET | Freq: Three times a day (TID) | ORAL | 0 refills | Status: DC | PRN

## 2023-05-19 MED ORDER — PANTOPRAZOLE SODIUM 40 MG PO TBEC: 40.0000 mg | DELAYED_RELEASE_TABLET | Freq: Two times a day (BID) | ORAL | 3 refills | Status: DC

## 2023-05-19 NOTE — Assessment & Plan Note (Signed)
Chronic Percocet prn  Potential benefits of a long term opioids use as well as potential risks (i.e. addiction risk, apnea etc) and complications (i.e. Somnolence, constipation and others) were explained to the patient and were aknowledged. A compound cream Rx

## 2023-05-19 NOTE — Assessment & Plan Note (Addendum)
 On Paxil  40 mg BID for many years Lunesta  prn - Dr Autry Legions prescribed

## 2023-05-19 NOTE — Progress Notes (Signed)
 Subjective:  Patient ID: Bianca Haley, female    DOB: 03/13/1951  Age: 73 y.o. MRN: [id number]  CC: Medical Management of Chronic Issues (3 MNTH F/U)   HPI SIRENA KISSELBURG presents for chronic pain, HTN, depression  Outpatient Medications Prior to Visit  Medication Sig Dispense Refill   acetaminophen  (TYLENOL ) 650 MG CR tablet Take 1,300 mg by mouth every 8 (eight) hours as needed for pain.     albuterol  (VENTOLIN  HFA) 108 (90 Base) MCG/ACT inhaler INHALE 2 PUFFS INTO THE LUNGS EVERY 4 HOURS AS NEEDED FOR WHEEZING OR SHORTNESS OF BREATH 18 g 5   Ascorbic Acid (VITAMIN C PO) Take 1 tablet by mouth every morning.     Calcium  Carb-Cholecalciferol  (CALCIUM  PLUS VITAMIN D3) 600-500 MG-UNIT CAPS Take 2 tablets by mouth every morning.     Cholecalciferol  (VITAMIN D ) 2000 units tablet Take 2,000 Units by mouth at bedtime.      cycloSPORINE  (RESTASIS ) 0.05 % ophthalmic emulsion Place 1 drop into both eyes every morning.     diazepam  (VALIUM ) 5 MG tablet TAKE 1/2 TABLET BY MOUTH TWICE DAILY. OKAY TO TAKE 1/2 TABLET IN MID-DAY IF NEEDED 30 tablet 5   diltiazem  (CARDIZEM  CD) 240 MG 24 hr capsule TAKE 1 CAPSULE BY MOUTH EVERY MORNING 90 capsule 1   fluticasone  (FLONASE ) 50 MCG/ACT nasal spray Place 2 sprays into both nostrils daily as needed for allergies or rhinitis. 16 g 5   hydrocortisone  2.5 % cream Apply twice daily for up to one week as needed for flares 30 g 2   irbesartan  (AVAPRO ) 150 MG tablet Take 1 tablet (150 mg total) by mouth daily. 14 tablet 0   metoprolol  tartrate (LOPRESSOR ) 50 MG tablet TAKE 1 TABLET(50 MG) BY MOUTH TWICE DAILY 60 tablet 11   Multiple Vitamin (MULTIVITAMIN WITH MINERALS) TABS tablet Take 1 tablet by mouth 2 (two) times daily.     MYRBETRIQ  50 MG TB24 tablet TAKE 1 TABLET BY MOUTH EVERY MORNING 30 tablet 5   naloxone  (NARCAN ) nasal spray 4 mg/0.1 mL 1 actuation in one nostril once. May repeat in 2-3 min 1 each 2   OVER THE COUNTER MEDICATION Take 2 capsules by  mouth in the morning. [employer]     PARoxetine  (PAXIL ) 40 MG tablet TAKE 1 TABLET BY MOUTH 2 TIMES A DAY 60 tablet 5   potassium chloride  (KLOR-CON ) 10 MEQ tablet TAKE 1 TABLET BY MOUTH DAILY 90 tablet 3   Probiotic CAPS Take 1 capsule by mouth every morning.     rosuvastatin  (CRESTOR ) 5 MG tablet Take 1 tablet (5 mg total) by mouth daily. 90 tablet 1   SUMAtriptan  (IMITREX ) 100 MG tablet TAKE 1 TABLET BY MOUTH ONCE. MAY REPEAT IN 2 HOURS IF HEADACHE PERSISTS OR RECURS 9 tablet 3   traZODone  (DESYREL ) 150 MG tablet TAKE 1 TABLET BY MOUTH EVERY NIGHT AT BEDTIME AS NEEDED FOR SLEEP 30 tablet 5   vitamin B-12 (CYANOCOBALAMIN ) 1000 MCG tablet Take 1,000 mcg by mouth every morning.     ELIQUIS  5 MG TABS tablet TAKE 1 TABLET BY MOUTH TWO TIMES A DAY 60 tablet 5   oxyCODONE -acetaminophen  (PERCOCET/ROXICET) 5-325 MG tablet Take 1 tablet by mouth every 8 (eight) hours as needed for severe pain (pain score 7-10). 90 tablet 0   pantoprazole  (PROTONIX ) 40 MG tablet TAKE 1 TABLET BY MOUTH TWICE DAILY( BEFORE A MEAL) 60 tablet 11   oxyCODONE -acetaminophen  (PERCOCET/ROXICET) 5-325 MG tablet Take 1 tablet by mouth every 8 (  eight) hours as needed for severe pain (pain score 7-10). 90 tablet 0   No facility-administered medications prior to visit.    ROS: Review of Systems  Constitutional:  Negative for activity change, appetite change, chills, fatigue and unexpected weight change.  HENT:  Negative for congestion, mouth sores and sinus pressure.   Eyes:  Negative for visual disturbance.  Respiratory:  Negative for cough and chest tightness.   Gastrointestinal:  Negative for abdominal pain and nausea.  Genitourinary:  Negative for difficulty urinating, frequency and vaginal pain.  Musculoskeletal:  Positive for back pain and gait problem.  Skin:  Negative for pallor and rash.  Neurological:  Negative for dizziness, tremors, weakness, numbness and headaches.  Psychiatric/Behavioral:  Negative for confusion,  sleep disturbance and suicidal ideas. The patient is nervous/anxious.     Objective:  BP 110/70 (BP Location: Left Arm, Patient Position: Sitting, Cuff Size: Normal)   Pulse (!) 59   Temp 98.3 F (36.8 C) (Oral)   Ht 5' 5.5" (1.664 m)   Wt 141 lb (64 kg)   SpO2 93%   BMI 23.11 kg/m   BP Readings from Last 3 Encounters:  05/19/23 110/70  05/07/23 (!) 140/72  02/17/23 120/80    Wt Readings from Last 3 Encounters:  05/19/23 141 lb (64 kg)  05/07/23 143 lb 12.8 oz (65.2 kg)  02/17/23 142 lb (64.4 kg)    Physical Exam Constitutional:      General: She is not in acute distress.    Appearance: Normal appearance. She is well-developed.  HENT:     Head: Normocephalic.     Right Ear: External ear normal.     Left Ear: External ear normal.     Nose: Nose normal.  Eyes:     General:        Right eye: No discharge.        Left eye: No discharge.     Conjunctiva/sclera: Conjunctivae normal.     Pupils: Pupils are equal, round, and reactive to light.  Neck:     Thyroid : No thyromegaly.     Vascular: No JVD.     Trachea: No tracheal deviation.  Cardiovascular:     Rate and Rhythm: Normal rate and regular rhythm.     Heart sounds: Normal heart sounds.  Pulmonary:     Effort: No respiratory distress.     Breath sounds: No stridor. No wheezing.  Abdominal:     General: Bowel sounds are normal. There is no distension.     Palpations: Abdomen is soft. There is no mass.     Tenderness: There is no abdominal tenderness. There is no guarding or rebound.  Musculoskeletal:        General: No tenderness.     Cervical back: Normal range of motion and neck supple. No rigidity.  Lymphadenopathy:     Cervical: No cervical adenopathy.  Skin:    Findings: No erythema or rash.  Neurological:     Cranial Nerves: No cranial nerve deficit.     Motor: No abnormal muscle tone.     Coordination: Coordination normal.     Deep Tendon Reflexes: Reflexes normal.  Psychiatric:        Behavior:  Behavior normal.        Thought Content: Thought content normal.        Judgment: Judgment normal.   LS w/pain  Lab Results  Component Value Date   WBC 5.0 02/15/2023   HGB 13.2 02/15/2023   HCT  40.9 02/15/2023   PLT 249.0 02/15/2023   GLUCOSE 90 03/11/2022   CHOL 150 02/15/2023   TRIG 67.0 02/15/2023   HDL 63.10 02/15/2023   LDLCALC 73 02/15/2023   ALT 9 03/11/2022   AST 20 03/11/2022   NA 140 03/11/2022   K 4.3 03/11/2022   CL 100 03/11/2022   CREATININE 0.80 03/11/2022   BUN 21 03/11/2022   CO2 36 (H) 03/11/2022   TSH 1.64 02/15/2023   INR 1.0 09/21/2018   HGBA1C 6.1 06/23/2018    DG BONE DENSITY (DXA) Result Date: 03/18/2023 EXAM: DUAL X-RAY ABSORPTIOMETRY (DXA) FOR BONE MINERAL DENSITY IMPRESSION: Referring Physician:  Landa Pine KEY Your patient completed a bone mineral density test using GE Lunar iDXA system (analysis version: 16). Technologist: BEC PATIENT: Name: Wyndi, Klever Patient ID: [id number] Birth Date: 10/20/1950 Height: 62.5 in. Sex: Female Measured: 03/18/2023 Weight: 143.4 lbs. Indications: Advanced Age, Albuterol , Bilateral Oophorectomy (65.51), Eliquis , Estrogen Deficient, Height Loss (781.91), Hysterectomy, Postmenopausal, Protonix  Fractures: Right Tib-Fib Treatments: Calcium  (E943.0), Multivitamin, Vitamin D  (E933.5) ASSESSMENT: The BMD measured at Forearm Radius 33% is 0.506 g/cm2 with a T-score of -4.2. This patient's diagnostic category is OSTEOPOROSIS according to World Health Organization [facility]) criteria. L4 was excluded due to degenerative changes. The quality of the exam is good. Site Region Measured Date Measured Age YA BMD Significant CHANGE T-score Left Forearm Radius 33% 03/18/2023 74.5 -4.2 0.506 g/cm2 AP Spine L1-L3 03/18/2023 74.5 -2.1 0.914 g/cm2 DualFemur Neck Left 03/18/2023 74.5 -3.3 0.578 g/cm2 DualFemur Total Mean 03/18/2023 74.5 -3.0 0.627 g/cm2 World Health Organization [facility]) criteria for post-menopausal, Caucasian Women: Normal        T-score at or above -1 SD Osteopenia   T-score between -1 and -2.5 SD Osteoporosis T-score at or below -2.5 SD RECOMMENDATION: 1. All patients should optimize calcium  and vitamin D  intake. 2. Consider FDA-approved medical therapies in postmenopausal women and men aged 34 years and older, based on the following: a. A hip or vertebral (clinical or morphometric) fracture. b. T-score = -2.5 at the femoral neck or spine after appropriate evaluation to exclude secondary causes. c. Low bone mass (T-score between -1.0 and -2.5 at the femoral neck or spine) and a 10-year probability of a hip fracture = 3% or a 10-year probability of a major osteoporosis-related fracture = 20% based on the [place] -adapted WHO algorithm. d. Clinician judgment and/or patient preferences may indicate treatment for people with 10-year fracture probabilities above or below these levels. FOLLOW-UP: Patients with diagnosis of osteoporosis or at high risk for fracture should have regular bone mineral density tests.? Patients eligible for Medicare are allowed routine testing every 2 years.? The testing frequency can be increased to one year for patients who have rapidly progressing disease, are receiving or discontinuing medical therapy to restore bone mass, or have additional risk factors. I have reviewed this study and agree with the findings. [facility] Electronically Signed   By: Sundra Engel M.D.   On: 03/18/2023 12:31    Assessment & Plan:   Problem List Items Addressed This Visit     Essential hypertension   Stable. Cont w/Atenolol , Diltiazem ,  Maxzide      Relevant Medications   apixaban  (ELIQUIS ) 5 MG TABS tablet   CRI (chronic renal insufficiency), stage 3 (moderate) (HCC)   Cont w/good hydration Monitor GFR      Anxiety disorder    On Paxil  40 mg BID for many years Lunesta  prn - Dr Autry Legions prescribed  Lumbar radiculopathy - Primary   PAF (paroxysmal atrial fibrillation) (HCC)   Relevant Medications    apixaban  (ELIQUIS ) 5 MG TABS tablet   Low back pain   Chronic Percocet prn  Potential benefits of a long term opioids use as well as potential risks (i.e. addiction risk, apnea etc) and complications (i.e. Somnolence, constipation and others) were explained to the patient and were aknowledged. A compound cream Rx      Relevant Medications   oxyCODONE -acetaminophen  (PERCOCET/ROXICET) 5-325 MG tablet   oxyCODONE -acetaminophen  (PERCOCET/ROXICET) 5-325 MG tablet      Meds ordered this encounter  Medications   apixaban  (ELIQUIS ) 5 MG TABS tablet    Sig: Take 1 tablet (5 mg total) by mouth 2 (two) times daily.    Dispense:  180 tablet    Refill:  3   pantoprazole  (PROTONIX ) 40 MG tablet    Sig: Take 1 tablet (40 mg total) by mouth 2 (two) times daily.    Dispense:  60180 tablet    Refill:  3   oxyCODONE -acetaminophen  (PERCOCET/ROXICET) 5-325 MG tablet    Sig: Take 1 tablet by mouth every 8 (eight) hours as needed for severe pain (pain score 7-10).    Dispense:  90 tablet    Refill:  0    Please fill on or after 06/05/23   oxyCODONE -acetaminophen  (PERCOCET/ROXICET) 5-325 MG tablet    Sig: Take 1 tablet by mouth every 8 (eight) hours as needed for severe pain (pain score 7-10).    Dispense:  90 tablet    Refill:  0    Please fill on or after 07/05/23      Follow-up: Return in about 2 months (around 07/17/2023) for a follow-up visit.  Anitra Barn, MD

## 2023-05-19 NOTE — Assessment & Plan Note (Signed)
Stable. Cont w/Atenolol, Diltiazem,  Maxzide 

## 2023-05-19 NOTE — Assessment & Plan Note (Signed)
 Cont w/good hydration Monitor GFR

## 2023-05-21 DIAGNOSIS — M5412 Radiculopathy, cervical region: Secondary | ICD-10-CM | POA: Diagnosis not present

## 2023-05-21 DIAGNOSIS — J189 Pneumonia, unspecified organism: Secondary | ICD-10-CM | POA: Diagnosis not present

## 2023-05-21 DIAGNOSIS — M542 Cervicalgia: Secondary | ICD-10-CM | POA: Diagnosis not present

## 2023-05-24 ENCOUNTER — Telehealth: Payer: Self-pay | Admitting: Cardiology

## 2023-05-24 DIAGNOSIS — Z7901 Long term (current) use of anticoagulants: Secondary | ICD-10-CM

## 2023-05-24 NOTE — Telephone Encounter (Signed)
   Pre-operative Risk Assessment    Patient Name: Bianca Haley  DOB: [birth year] MRN: [id number]   Date of last office visit: 07/06/2022  Date of next office visit: none   Request for Surgical Clearance    Procedure:   ESI  Date of Surgery:  Clearance TBD                                Surgeon:  Dr. Britt Bolognese Group or Practice Name:  [facility] Phone number:  [phone] Fax number:  [phone]   Type of Clearance Requested:   - Medical  - Pharmacy:  Hold Apixaban (Eliquis) for 3 days prior and resume the next day   Type of Anesthesia:  Not Indicated   Additional requests/questions:    Sharen Hones   05/24/2023, 2:14 PM

## 2023-05-24 NOTE — Telephone Encounter (Signed)
Patient with diagnosis of afib on Eliquis for anticoagulation.    Procedure: ESI Date of procedure: TBD   CHA2DS2-VASc Score = 5   This indicates a 7.2% annual risk of stroke. The patient's score is based upon: CHF History: 1 HTN History: 1 Diabetes History: 0 Stroke History: 0 Vascular Disease History: 1 Age Score: 1 Gender Score: 1     CrCl : need updated lab  Platelet count 249 K    Per office protocol, patient can hold Eliquis for 3 days prior to procedure.     **This guidance is not considered finalized until pre-operative APP has relayed final recommendations.**

## 2023-05-25 ENCOUNTER — Telehealth: Payer: Self-pay | Admitting: *Deleted

## 2023-05-25 NOTE — Telephone Encounter (Signed)
Primary card is Dr. Jens Som. Pt has been scheduled tele preop appt 06/04/23. Bmp has been ordered per preop pharm d.

## 2023-05-25 NOTE — Telephone Encounter (Signed)
   Name: Bianca Haley  DOB: 1950/10/01  MRN: [id number]  Primary Cardiologist: None   Preoperative team, please contact this patient and set up a phone call appointment for further preoperative risk assessment. Please obtain consent and complete medication review. Thank you for your help.  I confirm that guidance regarding antiplatelet and oral anticoagulation therapy has been completed and, if necessary, noted below.  Per office protocol, patient can hold Eliquis for 3 days prior to procedure.      I also confirmed the patient resides in the state of [place]. As per [facility] telemedicine laws, the patient must reside in the state in which the provider is licensed.   Joylene Grapes, NP 05/25/2023, 1:32 PM [facility]

## 2023-05-25 NOTE — Telephone Encounter (Signed)
Primary card is Dr. Jens Som. Pt has been scheduled tele preop appt 06/04/23. Bmp has been ordered per preop pharm d. Med rec and consent are done.      Patient Consent for Virtual Visit        Bianca Haley has provided verbal consent on 05/25/2023 for a virtual visit (video or telephone).   CONSENT FOR VIRTUAL VISIT FOR:  Bianca Haley  By participating in this virtual visit I agree to the following:  I hereby voluntarily request, consent and authorize [facility] and its employed or contracted physicians, physician assistants, nurse practitioners or other licensed health care professionals (the Practitioner), to provide me with telemedicine health care services (the "Services") as deemed necessary by the treating Practitioner. I acknowledge and consent to receive the Services by the Practitioner via telemedicine. I understand that the telemedicine visit will involve communicating with the Practitioner through live audiovisual communication technology and the disclosure of certain medical information by electronic transmission. I acknowledge that I have been given the opportunity to request an in-person assessment or other available alternative prior to the telemedicine visit and am voluntarily participating in the telemedicine visit.  I understand that I have the right to withhold or withdraw my consent to the use of telemedicine in the Haley of my care at any time, without affecting my right to future care or treatment, and that the Practitioner or I may terminate the telemedicine visit at any time. I understand that I have the right to inspect all information obtained and/or recorded in the Haley of the telemedicine visit and may receive copies of available information for a reasonable fee.  I understand that some of the potential risks of receiving the Services via telemedicine include:  Delay or interruption in medical evaluation due to technological equipment failure or  disruption; Information transmitted may not be sufficient (e.g. poor resolution of images) to allow for appropriate medical decision making by the Practitioner; and/or  In rare instances, security protocols could fail, causing a breach of personal health information.  Furthermore, I acknowledge that it is my responsibility to provide information about my medical history, conditions and care that is complete and accurate to the best of my ability. I acknowledge that Practitioner's advice, recommendations, and/or decision may be based on factors not within their control, such as incomplete or inaccurate data provided by me or distortions of diagnostic images or specimens that may result from electronic transmissions. I understand that the practice of medicine is not an exact science and that Practitioner makes no warranties or guarantees regarding treatment outcomes. I acknowledge that a copy of this consent can be made available to me via my patient portal [facility] MyChart), or I can request a printed copy by calling the office of [facility].    I understand that my insurance will be billed for this visit.   I have read or had this consent read to me. I understand the contents of this consent, which adequately explains the benefits and risks of the Services being provided via telemedicine.  I have been provided ample opportunity to ask questions regarding this consent and the Services and have had my questions answered to my satisfaction. I give my informed consent for the services to be provided through the use of telemedicine in my medical care

## 2023-05-27 DIAGNOSIS — Z7901 Long term (current) use of anticoagulants: Secondary | ICD-10-CM | POA: Diagnosis not present

## 2023-05-28 LAB — BASIC METABOLIC PANEL
BUN/Creatinine Ratio: 17 (ref 12–28)
BUN: 13 mg/dL (ref 8–27)
CO2: 27 mmol/L (ref 20–29)
Calcium: 9.6 mg/dL (ref 8.7–10.3)
Chloride: 100 mmol/L (ref 96–106)
Creatinine, Ser: 0.76 mg/dL (ref 0.57–1.00)
Glucose: 88 mg/dL (ref 70–99)
Potassium: 4 mmol/L (ref 3.5–5.2)
Sodium: 142 mmol/L (ref 134–144)
eGFR: 82 mL/min/{1.73_m2} (ref >59)

## 2023-06-01 ENCOUNTER — Ambulatory Visit (HOSPITAL_COMMUNITY)
Admission: RE | Admit: 2023-06-01 | Discharge: 2023-06-01 | Disposition: A | Discharge status: Home / Self Care | Payer: Medicare Other | Source: Ambulatory Visit | Attending: Adult Health | Admitting: Adult Health

## 2023-06-01 DIAGNOSIS — J984 Other disorders of lung: Secondary | ICD-10-CM | POA: Diagnosis not present

## 2023-06-01 DIAGNOSIS — J439 Emphysema, unspecified: Secondary | ICD-10-CM | POA: Diagnosis not present

## 2023-06-01 DIAGNOSIS — I771 Stricture of artery: Secondary | ICD-10-CM | POA: Diagnosis not present

## 2023-06-01 DIAGNOSIS — R911 Solitary pulmonary nodule: Secondary | ICD-10-CM | POA: Diagnosis not present

## 2023-06-01 DIAGNOSIS — R918 Other nonspecific abnormal finding of lung field: Secondary | ICD-10-CM | POA: Diagnosis not present

## 2023-06-01 NOTE — Telephone Encounter (Signed)
  CrCl 66 Platelet count 249  Per office protocol, patient can hold Eliquis for 3 days prior to procedure.   Patient will not need bridging with Lovenox (enoxaparin) around procedure.  **This guidance is not considered finalized until pre-operative APP has relayed final recommendations.**

## 2023-06-01 NOTE — Telephone Encounter (Signed)
Requesting office sent duplicate request. Pt ha tele prop appt 06/04/23. Once pt has been cleared our office will be sure to fax clearance notes and recommendations.    CLARIFICATION FOR PROCEDURE: C7-T1 ESI

## 2023-06-04 ENCOUNTER — Ambulatory Visit: Payer: Medicare Other | Attending: Cardiology

## 2023-06-04 DIAGNOSIS — Z01810 Encounter for preprocedural cardiovascular examination: Secondary | ICD-10-CM

## 2023-06-04 NOTE — Progress Notes (Signed)
Virtual Visit via Telephone Note   Because of [name]'s co-morbid illnesses, she is at least at moderate risk for complications without adequate follow up.  This format is felt to be most appropriate for this patient at this time.  The patient did not have access to video technology/had technical difficulties with video requiring transitioning to audio format only (telephone).  All issues noted in this document were discussed and addressed.  No physical exam could be performed with this format.  Please refer to the patient's chart for her consent to telehealth for [facility].  Evaluation Performed:  Preoperative cardiovascular risk assessment _____________   Date:  06/04/2023   Patient ID:  Bianca Haley, DOB [birth year], MRN [id number] Patient Location:  Home Provider location:   Office  Primary Care Provider:  Tresa Garter, MD Primary Cardiologist:  None  Chief Complaint / Patient Profile   73 y.o. y/o female with a h/o paroxysmal atrial fibrillation, essential hypertension, mitral valve insufficiency who is pending ESI and presents today for telephonic preoperative cardiovascular risk assessment.  History of Present Illness    Bianca Haley is a 73 y.o. female who presents via audio/video conferencing for a telehealth visit today.  Pt was last seen in cardiology clinic on 07/06/2022 by Dr. Jens Som.  At that time Bianca Haley was doing well .  The patient is now pending procedure as outlined above. Since her last visit, she continues to be stable from a cardiac standpoint.  Today she denies chest pain, shortness of breath, lower extremity edema, fatigue, palpitations, melena, hematuria, hemoptysis, diaphoresis, weakness, presyncope, syncope, orthopnea, and PND.   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   Hemorrhoids    History of colon polyps    History of endometriosis    and fibroids   History of hiatal hernia    History of UTI    Hypertension    Memory loss    Migraine    OSA (obstructive sleep apnea)    on nocturnal O2   Recurrent pneumonia 07/2018   Seasonal allergies    Vitamin D deficiency    Wears glasses    Past Surgical History:  Procedure Laterality Date   ABDOMINAL HYSTERECTOMY     BACK SURGERY     BIOPSY  10/20/2018   Procedure: BIOPSY;  Surgeon: Rachael Fee, MD;  Location: [facility];  Service: Endoscopy;;   BLADDER SUSPENSION     COLONOSCOPY  02/16/2008   normal   DENTAL SURGERY     ESOPHAGOGASTRODUODENOSCOPY (EGD) WITH PROPOFOL N/A 10/20/2018   Procedure: ESOPHAGOGASTRODUODENOSCOPY (EGD) WITH PROPOFOL;  Surgeon: Rachael Fee, MD;  Location: [facility];  Service: Endoscopy;  Laterality: N/A;   EUS N/A 10/20/2018   Procedure: UPPER ENDOSCOPIC ULTRASOUND (EUS) RADIAL;  Surgeon: Rachael Fee, MD;  Location: [facility];  Service: Endoscopy;  Laterality: N/A;   FOOT SURGERY Left    x3   laparoscopy     for evaluation of endometriosis  LEFT HEART CATH AND CORONARY ANGIOGRAPHY N/A 09/04/2021   Procedure: LEFT HEART CATH AND CORONARY ANGIOGRAPHY;  Surgeon: Corky Crafts, MD;  Location: [facility] INVASIVE CV LAB;  Service: Cardiovascular;  Laterality: N/A;   TOTAL KNEE ARTHROPLASTY Left 07/15/2015   Procedure: TOTAL LEFT KNEE ARTHROPLASTY;  Surgeon: Durene Romans, MD;  Location: [facility];  Service: Orthopedics;  Laterality: Left;   UPPER GI ENDOSCOPY      Allergies  Allergies  Allergen Reactions   Penicillins Shortness Of Breath, Rash and Other (See Comments)    Did it involve swelling of the face/tongue/throat, SOB, or low BP? No Did it involve sudden or  severe rash/hives, skin peeling, or any reaction on the inside of your mouth or nose? Yes Did you need to seek medical attention at a hospital or doctor's office? No When did it last happen?      10 + years If all above answers are "NO", may proceed with cephalosporin use.     Morphine And Codeine Swelling    Sedation/Swelling described like edema/bloating/doesn't help the pain Morphine only; tolerates other opioids.    Morphine Other (See Comments)   Cefepime Rash    Reaction while taking both cefepime and vancomycin at [facility] (after 3 days)   Vancomycin Rash    Reaction while taking both cefepime and vancomycin at [facility] (after 3 days)    Home Medications    Prior to Admission medications   Medication Sig Start Date End Date Taking? Authorizing Provider  acetaminophen (TYLENOL) 650 MG CR tablet Take 1,300 mg by mouth every 8 (eight) hours as needed for pain.    [provider]  albuterol (VENTOLIN HFA) 108 (90 Base) MCG/ACT inhaler INHALE 2 PUFFS INTO THE LUNGS EVERY 4 HOURS AS NEEDED FOR WHEEZING OR SHORTNESS OF BREATH 11/05/21   Plotnikov, Georgina Quint, MD  apixaban (ELIQUIS) 5 MG TABS tablet Take 1 tablet (5 mg total) by mouth 2 (two) times daily. 05/19/23   Plotnikov, Georgina Quint, MD  Ascorbic Acid (VITAMIN C PO) Take 1 tablet by mouth every morning.    [provider]  Calcium Carb-Cholecalciferol (CALCIUM PLUS VITAMIN D3) 600-500 MG-UNIT CAPS Take 2 tablets by mouth every morning.    [provider]  Cholecalciferol (VITAMIN D) 2000 units tablet Take 2,000 Units by mouth at bedtime.     [provider]  cycloSPORINE (RESTASIS) 0.05 % ophthalmic emulsion Place 1 drop into both eyes every morning.    [provider]  diazepam (VALIUM) 5 MG tablet TAKE 1/2 TABLET BY MOUTH TWICE DAILY. OKAY TO TAKE 1/2 TABLET IN MID-DAY IF NEEDED 02/22/23   Plotnikov, Georgina Quint, MD  diltiazem (CARDIZEM CD) 240 MG 24 hr capsule TAKE 1 CAPSULE BY MOUTH EVERY MORNING  03/17/23   Plotnikov, Georgina Quint, MD  fluticasone (FLONASE) 50 MCG/ACT nasal spray Place 2 sprays into both nostrils daily as needed for allergies or rhinitis. 05/12/22   Plotnikov, Georgina Quint, MD  hydrocortisone 2.5 % cream Apply twice daily for up to one week as needed for flares 08/11/22   Terri Piedra, DO  irbesartan (AVAPRO) 150 MG tablet Take 1 tablet (150 mg total) by mouth daily. 05/07/23   Elenore Paddy, NP  metoprolol tartrate (LOPRESSOR) 50 MG tablet TAKE 1 TABLET(50 MG) BY MOUTH TWICE DAILY 06/09/22   Plotnikov, Georgina Quint, MD  Multiple Vitamin (MULTIVITAMIN WITH MINERALS) TABS tablet Take 1 tablet by mouth 2 (two) times daily.    [provider]  MYRBETRIQ 50  MG TB24 tablet TAKE 1 TABLET BY MOUTH EVERY MORNING 05/03/23   Plotnikov, Georgina Quint, MD  naloxone [facility]) nasal spray 4 mg/0.1 mL 1 actuation in one nostril once. May repeat in 2-3 min 11/05/21   Plotnikov, Georgina Quint, MD  OVER THE COUNTER MEDICATION Take 2 capsules by mouth in the morning. [facility]    [provider]  oxyCODONE-acetaminophen (PERCOCET/ROXICET) 5-325 MG tablet Take 1 tablet by mouth every 8 (eight) hours as needed for severe pain (pain score 7-10). 05/19/23   Plotnikov, Georgina Quint, MD  oxyCODONE-acetaminophen (PERCOCET/ROXICET) 5-325 MG tablet Take 1 tablet by mouth every 8 (eight) hours as needed for severe pain (pain score 7-10). 05/19/23   Plotnikov, Georgina Quint, MD  pantoprazole (PROTONIX) 40 MG tablet Take 1 tablet (40 mg total) by mouth 2 (two) times daily. 05/19/23   Plotnikov, Georgina Quint, MD  PARoxetine (PAXIL) 40 MG tablet TAKE 1 TABLET BY MOUTH 2 TIMES A DAY 03/23/23   Plotnikov, Georgina Quint, MD  potassium chloride (KLOR-CON) 10 MEQ tablet TAKE 1 TABLET BY MOUTH DAILY 11/04/22   Plotnikov, Georgina Quint, MD  Probiotic CAPS Take 1 capsule by mouth every morning.    [provider]  rosuvastatin (CRESTOR) 5 MG tablet Take 1 tablet (5 mg total) by mouth daily. 09/24/22   Plotnikov, Georgina Quint, MD   SUMAtriptan (IMITREX) 100 MG tablet TAKE 1 TABLET BY MOUTH ONCE. MAY REPEAT IN 2 HOURS IF HEADACHE PERSISTS OR RECURS 01/21/23   Plotnikov, Georgina Quint, MD  traZODone (DESYREL) 150 MG tablet TAKE 1 TABLET BY MOUTH EVERY NIGHT AT BEDTIME AS NEEDED FOR SLEEP 03/02/23   Plotnikov, Georgina Quint, MD  vitamin B-12 (CYANOCOBALAMIN) 1000 MCG tablet Take 1,000 mcg by mouth every morning.    [provider]    Physical Exam    Vital Signs:  Bianca Haley does not have vital signs available for review today.  Given telephonic nature of communication, physical exam is limited. AAOx3. NAD. Normal affect.  Speech and respirations are unlabored.  Accessory Clinical Findings    None  Assessment & Plan    1.  Preoperative Cardiovascular Risk Assessment:ESI   Date of Surgery:  Clearance TBD                                  Surgeon:  Dr. Britt Bolognese Group or Practice Name:  [facility] Phone number:  [phone] Fax number:  [phone]      Primary Cardiologist: None  Chart reviewed as part of pre-operative protocol coverage. Given past medical history and time since last visit, based on ACC/AHA guidelines, Bianca Haley would be at acceptable risk for the planned procedure without further cardiovascular testing.   CHA2DS2-VASc Score = 5   This indicates a 7.2% annual risk of stroke. The patient's score is based upon: CHF History: 1 HTN History: 1 Diabetes History: 0 Stroke History: 0 Vascular Disease History: 1 Age Score: 1 Gender Score: 1  CrCl 66 Platelet count 249   Per office protocol, patient can hold Eliquis for 3 days prior to procedure.   Patient will not need bridging with Lovenox (enoxaparin) around procedure.  Patient was advised that if she develops new symptoms prior to surgery to contact our office to arrange a follow-up appointment.  He verbalized understanding.  I will route this recommendation to the requesting party via Epic  fax function and remove from pre-op  pool.      Time:   Today, I have spent 13 minutes with the patient with telehealth technology discussing medical history, symptoms, and management plan.     Ronney Asters, NP  06/04/2023, 7:17 AM

## 2023-06-09 NOTE — Telephone Encounter (Signed)
 Our office received duplicate request.   Pt was cleared by Lawana Pray, FNP 06/04/23. See his notes that provide clearance and recommendations for holding blood thinner Eliquis  x 3 days prior.    I will re-fax the clearance notes again today.

## 2023-06-10 ENCOUNTER — Other Ambulatory Visit: Payer: Self-pay | Admitting: Internal Medicine

## 2023-06-17 ENCOUNTER — Encounter (HOSPITAL_BASED_OUTPATIENT_CLINIC_OR_DEPARTMENT_OTHER): Payer: Self-pay | Admitting: Pulmonary Disease

## 2023-06-17 ENCOUNTER — Ambulatory Visit (HOSPITAL_BASED_OUTPATIENT_CLINIC_OR_DEPARTMENT_OTHER): Payer: Medicare Other | Admitting: Pulmonary Disease

## 2023-06-17 VITALS — BP 122/80 | HR 78 | Ht 65.5 in | Wt 143.8 lb

## 2023-06-17 DIAGNOSIS — J9611 Chronic respiratory failure with hypoxia: Secondary | ICD-10-CM | POA: Diagnosis not present

## 2023-06-17 DIAGNOSIS — J479 Bronchiectasis, uncomplicated: Secondary | ICD-10-CM | POA: Insufficient documentation

## 2023-06-17 NOTE — Progress Notes (Signed)
   Subjective:    Patient ID: Bianca Haley, female    DOB: 1951/03/12, 73 y.o.   MRN: [id number]  HPI  73  yo never smoker with bronchiectasis and recurrent pneumonia attributed to aspiration ? due to narcotics & esophageal dysmotility  -first seen for chronic cough x on  02/21/13    She underwent sleep study by neurology and had significant O2 desaturation, placed on 1 L of oxygen  She is currently on oxycodone 5 mg 3 times daily, trazodone 150 mg at bedtime and Valium 2.5 mg twice daily.   42-month follow-up visit.  She is concerned about diagnosis of emphysema mentioned on CT scan.  I reviewed images and report with patient, concern for mycobacterial infection raised.  She reports sputum production in the mornings, occasional dyspnea requiring albuterol MDI.  No wheezing or frequent chest colds. Her incidence of pneumonia as decreased since she started sleeping with her head of bed elevated, she is convinced that this was due to reflux    Significant tests/ events reviewed   N PSG 01/2019 TST 328 minutes , weight 157 pounds, AHI 1.5/hour. Oxygen saturation less than 89% for 84% of the time, corrected by 1 L oxygen   CT chest 05/2023 stable scarring and bronchiectasis, reactive lymph nodes CT chest October 05, 2021 negative PE bronchial wall thickening and endobronchial debris with multifocal consolidative opacities.  Concerning for possible aspiration, multiple mediastinal lymph nodes, trace pleural effusion on the right   CT angio 07/2018 Patchy areas of very subtle ground-glass attenuation are noted throughout the lungs bilaterally   02/2013 CT chest showed RML/RLL pneumonia and what appeared to be some progression of bronchiectasis (first noted 2011)      Esophagram was negative. IgG,IgaA and IgM levels were low, but not immunosuppressed.     09/2013  admitted for recurrent PNA /aspiration pneumonitis . CXR showed R>L consolidation c/w multifocal PNA.   MBS >> dysphagia but  raised concerns of possible primary esophageal dysmotility as barium pill stopped mid esophagus requiring additional thin barium to facilitate transit to stomach , PPI to BID prior to discharge.        04/2014 EGD showed mild esophagitis.    2015 BL severe pneumonia when visiting son in [place]     Review of Systems neg for any significant sore throat, dysphagia, itching, sneezing, nasal congestion or excess/ purulent secretions, fever, chills, sweats, unintended wt loss, pleuritic or exertional cp, hempoptysis, orthopnea pnd or change in chronic leg swelling. Also denies presyncope, palpitations, heartburn, abdominal pain, nausea, vomiting, diarrhea or change in bowel or urinary habits, dysuria,hematuria, rash, arthralgias, visual complaints, headache, numbness weakness or ataxia.     Objective:   Physical Exam  Gen. Pleasant, well-nourished, in no distress ENT - no thrush, no pallor/icterus,no post nasal drip Neck: No JVD, no thyromegaly, no carotid bruits Lungs: no use of accessory muscles, no dullness to percussion, RT base rales no rhonchi  Cardiovascular: Rhythm regular, heart sounds  normal, no murmurs or gallops, no peripheral edema Musculoskeletal: No deformities, no cyanosis or clubbing        Assessment & Plan:

## 2023-06-17 NOTE — Assessment & Plan Note (Addendum)
We reviewed implications of this diagnosis.  It is reassuring that this is stable over the past couple of years.  I favor that this is due to recurrent pneumonia aspiration related -likely due to narcotics and GERD.  I do not think Mycobacterium is a concern but will obtain sputum for AFB. We emphasized airway clearance measures today including flutter valve, Bronchodilators, mucolytic's as needed We will obtain PFTs and if there is significant airway obstruction then use triple therapy

## 2023-06-17 NOTE — Patient Instructions (Addendum)
  You have bronchiectasis due to recurrent pneumonia  X check sputum afb  X PFTs  X flutter valve

## 2023-06-17 NOTE — Assessment & Plan Note (Signed)
Continue nocturnal oxygen

## 2023-06-21 DIAGNOSIS — J189 Pneumonia, unspecified organism: Secondary | ICD-10-CM | POA: Diagnosis not present

## 2023-06-24 ENCOUNTER — Encounter (HOSPITAL_BASED_OUTPATIENT_CLINIC_OR_DEPARTMENT_OTHER): Payer: Medicare Other

## 2023-06-28 DIAGNOSIS — M5412 Radiculopathy, cervical region: Secondary | ICD-10-CM | POA: Diagnosis not present

## 2023-07-01 LAB — SPECIMEN STATUS REPORT

## 2023-07-01 LAB — MTB-RIF NAA W AFB CULT, NON-SPUTUM

## 2023-07-12 ENCOUNTER — Other Ambulatory Visit: Payer: Self-pay | Admitting: Internal Medicine

## 2023-07-13 ENCOUNTER — Other Ambulatory Visit: Payer: Self-pay | Admitting: Internal Medicine

## 2023-07-13 MED ORDER — DIAZEPAM 5 MG PO TABS: ORAL_TABLET | ORAL | 5 refills | Status: DC

## 2023-07-13 NOTE — Telephone Encounter (Signed)
 Copied from CRM [id number]. Topic: Clinical - Medication Refill >> Jul 13, 2023  9:20 AM Drema Balzarine wrote: Most Recent Primary Care Visit:  Provider: Tresa Garter  Department: [facility]  Visit Type: OFFICE VISIT  Date: 05/19/2023  Medication: diazepam (VALIUM) 5 MG tablet   Has the patient contacted their pharmacy? Yes (Agent: If no, request that the patient contact the pharmacy for the refill. If patient does not wish to contact the pharmacy document the reason why and proceed with request.) (Agent: If yes, when and what did the pharmacy advise?)  Is this the correct pharmacy for this prescription? Yes If no, delete pharmacy and type the correct one.  This is the patient's preferred pharmacy:   [facility] [id number] [place], [place] - [address] [place] [address] Phone: [phone] Fax: [phone]   Has the prescription been filled recently? Yes  Is the patient out of the medication? Patient has 1-2 tablets left and leaving for a trip on Thursday   Has the patient been seen for an appointment in the last year OR does the patient have an upcoming appointment? Yes  Can we respond through MyChart? No  Agent: Please be advised that Rx refills may take up to 3 business days. We ask that you follow-up with your pharmacy.

## 2023-07-19 DIAGNOSIS — J189 Pneumonia, unspecified organism: Secondary | ICD-10-CM | POA: Diagnosis not present

## 2023-07-21 ENCOUNTER — Encounter: Payer: Self-pay | Admitting: Internal Medicine

## 2023-07-21 ENCOUNTER — Ambulatory Visit (INDEPENDENT_AMBULATORY_CARE_PROVIDER_SITE_OTHER): Payer: Medicare Other | Admitting: Internal Medicine

## 2023-07-21 VITALS — BP 120/78 | HR 43 | Temp 98.6°F | Ht 65.5 in | Wt 144.0 lb

## 2023-07-21 DIAGNOSIS — F419 Anxiety disorder, unspecified: Secondary | ICD-10-CM

## 2023-07-21 DIAGNOSIS — I10 Essential (primary) hypertension: Secondary | ICD-10-CM | POA: Diagnosis not present

## 2023-07-21 DIAGNOSIS — G8929 Other chronic pain: Secondary | ICD-10-CM

## 2023-07-21 DIAGNOSIS — M5442 Lumbago with sciatica, left side: Secondary | ICD-10-CM | POA: Diagnosis not present

## 2023-07-21 DIAGNOSIS — N183 Chronic kidney disease, stage 3 unspecified: Secondary | ICD-10-CM

## 2023-07-21 DIAGNOSIS — M5441 Lumbago with sciatica, right side: Secondary | ICD-10-CM | POA: Diagnosis not present

## 2023-07-21 DIAGNOSIS — J479 Bronchiectasis, uncomplicated: Secondary | ICD-10-CM

## 2023-07-21 MED ORDER — OXYCODONE-ACETAMINOPHEN 5-325 MG PO TABS
1.0000 | ORAL_TABLET | Freq: Three times a day (TID) | ORAL | 0 refills | Status: DC | PRN
Start: 2023-07-21 — End: 2023-10-17

## 2023-07-21 MED ORDER — OXYCODONE-ACETAMINOPHEN 5-325 MG PO TABS: 1.0000 | ORAL_TABLET | Freq: Three times a day (TID) | ORAL | 0 refills | Status: DC | PRN

## 2023-07-21 NOTE — Assessment & Plan Note (Signed)
Chronic Percocet prn  Potential benefits of a long term opioids use as well as potential risks (i.e. addiction risk, apnea etc) and complications (i.e. Somnolence, constipation and others) were explained to the patient and were aknowledged. A compound cream Rx

## 2023-07-21 NOTE — Assessment & Plan Note (Signed)
Stable. Cont w/Atenolol, Diltiazem,  Maxzide 

## 2023-07-21 NOTE — Assessment & Plan Note (Signed)
 Continue Paxil 40 mg BID for many years

## 2023-07-21 NOTE — Assessment & Plan Note (Signed)
 Use Albuterol MDI before walking

## 2023-07-21 NOTE — Assessment & Plan Note (Signed)
 Cont w/good hydration Monitor GFR

## 2023-07-21 NOTE — Progress Notes (Signed)
 Subjective:  Patient ID: Bianca Haley, female    DOB: 1950-07-27  Age: 73 y.o. MRN: [id number]  CC: Medical Management of Chronic Issues (2 mnth f/u)   HPI Bianca Haley presents for LBP, anxiety, bronchiectases f/u  Outpatient Medications Prior to Visit  Medication Sig Dispense Refill   acetaminophen (TYLENOL) 650 MG CR tablet Take 1,300 mg by mouth every 8 (eight) hours as needed for pain.     albuterol (VENTOLIN HFA) 108 (90 Base) MCG/ACT inhaler INHALE 2 PUFFS INTO THE LUNGS EVERY 4 HOURS AS NEEDED FOR WHEEZING OR SHORTNESS OF BREATH 18 g 5   apixaban (ELIQUIS) 5 MG TABS tablet Take 1 tablet (5 mg total) by mouth 2 (two) times daily. 180 tablet 3   Ascorbic Acid (VITAMIN C PO) Take 1 tablet by mouth every morning.     Calcium Carb-Cholecalciferol (CALCIUM PLUS VITAMIN D3) 600-500 MG-UNIT CAPS Take 2 tablets by mouth every morning.     Cholecalciferol (VITAMIN D) 2000 units tablet Take 2,000 Units by mouth at bedtime.      cycloSPORINE (RESTASIS) 0.05 % ophthalmic emulsion Place 1 drop into both eyes every morning.     diazepam (VALIUM) 5 MG tablet TAKE 1/2 TABLET BY MOUTH TWICE DAILY. OKAY TO TAKE 1/2 TABLET IN MID-DAY IF NEEDED 30 tablet 5   diltiazem (CARDIZEM CD) 240 MG 24 hr capsule TAKE 1 CAPSULE BY MOUTH EVERY MORNING 90 capsule 1   fluticasone (FLONASE) 50 MCG/ACT nasal spray SPRAY 2 SPRAYS IN EACH NOSTRIL ONCE DAILY AS NEEDED FOR ALLERGIES OR RHINITIS 16 mL 3   hydrocortisone 2.5 % cream Apply twice daily for up to one week as needed for flares 30 g 2   ibandronate (BONIVA) 150 MG tablet Take 150 mg by mouth every 30 (thirty) days.     irbesartan (AVAPRO) 150 MG tablet Take 1 tablet (150 mg total) by mouth daily. 14 tablet 0   metoprolol tartrate (LOPRESSOR) 50 MG tablet TAKE 1 TABLET(50 MG) BY MOUTH TWICE DAILY 60 tablet 11   Multiple Vitamin (MULTIVITAMIN WITH MINERALS) TABS tablet Take 1 tablet by mouth 2 (two) times daily.     MYRBETRIQ 50 MG TB24 tablet TAKE 1 TABLET  BY MOUTH EVERY MORNING 30 tablet 5   naloxone (NARCAN) nasal spray 4 mg/0.1 mL 1 actuation in one nostril once. May repeat in 2-3 min 1 each 2   OVER THE COUNTER MEDICATION Take 2 capsules by mouth in the morning. [place]     pantoprazole (PROTONIX) 40 MG tablet Take 1 tablet (40 mg total) by mouth 2 (two) times daily. 60180 tablet 3   PARoxetine (PAXIL) 40 MG tablet TAKE 1 TABLET BY MOUTH 2 TIMES A DAY 60 tablet 5   potassium chloride (KLOR-CON) 10 MEQ tablet TAKE 1 TABLET BY MOUTH DAILY 90 tablet 3   Probiotic CAPS Take 1 capsule by mouth every morning.     rosuvastatin (CRESTOR) 5 MG tablet Take 1 tablet (5 mg total) by mouth daily. 90 tablet 1   SUMAtriptan (IMITREX) 100 MG tablet TAKE 1 TABLET BY MOUTH ONCE. MAY REPEAT IN 2 HOURS IF HEADACHE PERSISTS OR RECURS 9 tablet 3   traZODone (DESYREL) 150 MG tablet TAKE 1 TABLET BY MOUTH EVERY NIGHT AT BEDTIME AS NEEDED FOR SLEEP 30 tablet 5   valACYclovir (VALTREX) 1000 MG tablet Take 1/2 tablet(s) bid by oral route X 3- 5days prn     vitamin B-12 (CYANOCOBALAMIN) 1000 MCG tablet Take 1,000 mcg by mouth  every morning.     oxyCODONE-acetaminophen (PERCOCET/ROXICET) 5-325 MG tablet Take 1 tablet by mouth every 8 (eight) hours as needed for severe pain (pain score 7-10). 90 tablet 0   oxyCODONE-acetaminophen (PERCOCET/ROXICET) 5-325 MG tablet Take 1 tablet by mouth every 8 (eight) hours as needed for severe pain (pain score 7-10). (Patient not taking: Reported on 07/21/2023) 90 tablet 0   No facility-administered medications prior to visit.    ROS: Review of Systems  Constitutional:  Negative for activity change, appetite change, chills, fatigue and unexpected weight change.  HENT:  Negative for congestion, mouth sores and sinus pressure.   Eyes:  Negative for visual disturbance.  Respiratory:  Negative for cough and chest tightness.   Gastrointestinal:  Negative for abdominal pain and nausea.  Genitourinary:  Negative for difficulty urinating,  frequency and vaginal pain.  Musculoskeletal:  Positive for back pain. Negative for gait problem.  Skin:  Negative for pallor and rash.  Neurological:  Negative for dizziness, tremors, weakness, numbness and headaches.  Psychiatric/Behavioral:  Negative for confusion, sleep disturbance and suicidal ideas.     Objective:  BP 120/78   Pulse (!) 43   Temp 98.6 F (37 C) (Oral)   Ht 5' 5.5" (1.664 m)   Wt 144 lb (65.3 kg)   SpO2 94%   BMI 23.60 kg/m   BP Readings from Last 3 Encounters:  07/21/23 120/78  06/17/23 122/80  05/19/23 110/70    Wt Readings from Last 3 Encounters:  07/21/23 144 lb (65.3 kg)  06/17/23 143 lb 12.8 oz (65.2 kg)  05/19/23 141 lb (64 kg)    Physical Exam Constitutional:      General: She is not in acute distress.    Appearance: Normal appearance. She is well-developed.  HENT:     Head: Normocephalic.     Right Ear: External ear normal.     Left Ear: External ear normal.     Nose: Nose normal.  Eyes:     General:        Right eye: No discharge.        Left eye: No discharge.     Conjunctiva/sclera: Conjunctivae normal.     Pupils: Pupils are equal, round, and reactive to light.  Neck:     Thyroid: No thyromegaly.     Vascular: No JVD.     Trachea: No tracheal deviation.  Cardiovascular:     Rate and Rhythm: Normal rate and regular rhythm.     Heart sounds: Normal heart sounds.  Pulmonary:     Effort: No respiratory distress.     Breath sounds: No stridor. No wheezing.  Abdominal:     General: Bowel sounds are normal. There is no distension.     Palpations: Abdomen is soft. There is no mass.     Tenderness: There is no abdominal tenderness. There is no guarding or rebound.  Musculoskeletal:        General: Tenderness present.     Cervical back: Normal range of motion and neck supple. No rigidity.     Right lower leg: No edema.     Left lower leg: No edema.  Lymphadenopathy:     Cervical: No cervical adenopathy.  Skin:    Findings: No  erythema or rash.  Neurological:     Mental Status: She is oriented to person, place, and time.     Cranial Nerves: No cranial nerve deficit.     Motor: No abnormal muscle tone.     Coordination:  Coordination normal.     Deep Tendon Reflexes: Reflexes normal.  Psychiatric:        Behavior: Behavior normal.        Thought Content: Thought content normal.        Judgment: Judgment normal.   DOE  Lab Results  Component Value Date   WBC 5.0 02/15/2023   HGB 13.2 02/15/2023   HCT 40.9 02/15/2023   PLT 249.0 02/15/2023   GLUCOSE 88 05/27/2023   CHOL 150 02/15/2023   TRIG 67.0 02/15/2023   HDL 63.10 02/15/2023   LDLCALC 73 02/15/2023   ALT 9 03/11/2022   AST 20 03/11/2022   NA 142 05/27/2023   K 4.0 05/27/2023   CL 100 05/27/2023   CREATININE 0.76 05/27/2023   BUN 13 05/27/2023   CO2 27 05/27/2023   TSH 1.64 02/15/2023   INR 1.0 09/21/2018   HGBA1C 6.1 06/23/2018    CT Chest Wo Contrast Result Date: 06/10/2023 CLINICAL DATA:  Followup abnormal chest CT with micro nodularity. EXAM: CT CHEST WITHOUT CONTRAST TECHNIQUE: Multidetector CT imaging of the chest was performed following the standard protocol without IV contrast. RADIATION DOSE REDUCTION: This exam was performed according to the departmental dose-optimization program which includes automated exposure control, adjustment of the mA and/or kV according to patient size and/or use of iterative reconstruction technique. COMPARISON:  Chest CT 09/07/2022 FINDINGS: Cardiovascular: The heart is normal in size. No pericardial effusion. Stable mild tortuosity and calcification of the thoracic aorta. Mediastinum/Nodes: Stable borderline enlarged mediastinal lymph nodes. 14 mm precarinal node is unchanged. No new or progressive findings. The esophagus is unremarkable. Lungs/Pleura: Stable underlying emphysematous changes and areas of pulmonary scarring. Stable areas of irregular bronchial wall thickening, mild bronchiectasis and tree in bud  type nodularity. Findings consistent with chronic inflammation or atypical infection such as MAC. No discrete worrisome pulmonary lesions or pulmonary nodules. No focal airspace consolidation, pleural effusion or pleural lesions. Upper Abdomen: No significant upper abdominal findings. Musculoskeletal: No significant bony findings. Stable scoliosis and exaggerated thoracic kyphosis. Fused midthoracic vertebral bodies again noted. IMPRESSION: 1. Stable underlying emphysematous changes and areas of pulmonary scarring. 2. Stable areas of irregular bronchial wall thickening, mild bronchiectasis and tree in bud type nodularity. Findings consistent with chronic inflammation or atypical infection such as MAC. 3. No worrisome pulmonary lesions or nodules. 4. Stable borderline enlarged mediastinal lymph nodes. This is likely reactive given the underlying lung changes. No new or progressive findings. Electronically Signed   By: Rudie Meyer M.D.   On: 06/10/2023 21:56    Assessment & Plan:   Problem List Items Addressed This Visit     Essential hypertension   Stable. Cont w/Atenolol, Diltiazem,  Maxzide      CRI (chronic renal insufficiency), stage 3 (moderate) (HCC) - Primary   Cont w/good hydration Monitor GFR      Anxiety disorder   Continue Paxil 40 mg BID for many years         Chronic low back pain   Chronic Percocet prn  Potential benefits of a long term opioids use as well as potential risks (i.e. addiction risk, apnea etc) and complications (i.e. Somnolence, constipation and others) were explained to the patient and were aknowledged. A compound cream Rx      Relevant Medications   oxyCODONE-acetaminophen (PERCOCET/ROXICET) 5-325 MG tablet   oxyCODONE-acetaminophen (PERCOCET/ROXICET) 5-325 MG tablet   Bronchiectasis without complication (HCC)   Use Albuterol MDI before walking         Meds  ordered this encounter  Medications   oxyCODONE-acetaminophen (PERCOCET/ROXICET) 5-325  MG tablet    Sig: Take 1 tablet by mouth every 8 (eight) hours as needed for severe pain (pain score 7-10).    Dispense:  90 tablet    Refill:  0    Please fill on or after 08/05/23   oxyCODONE-acetaminophen (PERCOCET/ROXICET) 5-325 MG tablet    Sig: Take 1 tablet by mouth every 8 (eight) hours as needed for severe pain (pain score 7-10).    Dispense:  90 tablet    Refill:  0    Please fill on or after 09/04/23      Follow-up: Return in about 3 months (around 10/21/2023) for a follow-up visit.  Sonda Primes, MD

## 2023-07-22 ENCOUNTER — Ambulatory Visit (HOSPITAL_BASED_OUTPATIENT_CLINIC_OR_DEPARTMENT_OTHER): Payer: Medicare Other | Admitting: Pulmonary Disease

## 2023-07-22 DIAGNOSIS — J479 Bronchiectasis, uncomplicated: Secondary | ICD-10-CM | POA: Diagnosis not present

## 2023-07-22 LAB — PULMONARY FUNCTION TEST
DL/VA % pred: 99 %
DL/VA: 4.14 ml/min/mmHg/L
DLCO cor % pred: 76 %
DLCO cor: 14.17 ml/min/mmHg
DLCO unc % pred: 76 %
DLCO unc: 14.17 ml/min/mmHg
FEF 25-75 Post: 0.83 L/s
FEF 25-75 Pre: 0.51 L/s
FEF2575-%Change-Post: 62 %
FEF2575-%Pred-Post: 50 %
FEF2575-%Pred-Pre: 30 %
FEV1-%Change-Post: 14 %
FEV1-%Pred-Post: 55 %
FEV1-%Pred-Pre: 48 %
FEV1-Post: 1.13 L
FEV1-Pre: 0.99 L
FEV1FVC-%Change-Post: -3 %
FEV1FVC-%Pred-Pre: 88 %
FEV6-%Change-Post: 18 %
FEV6-%Pred-Post: 68 %
FEV6-%Pred-Pre: 57 %
FEV6-Post: 1.78 L
FEV6-Pre: 1.5 L
FEV6FVC-%Pred-Post: 105 %
FEV6FVC-%Pred-Pre: 105 %
FVC-%Change-Post: 18 %
FVC-%Pred-Post: 65 %
FVC-%Pred-Pre: 54 %
FVC-Post: 1.78 L
FVC-Pre: 1.5 L
Post FEV1/FVC ratio: 64 %
Post FEV6/FVC ratio: 100 %
Pre FEV1/FVC ratio: 66 %
Pre FEV6/FVC Ratio: 100 %
RV % pred: 141 %
RV: 3.13 L
TLC % pred: 101 %
TLC: 4.98 L

## 2023-07-22 NOTE — Progress Notes (Signed)
 Full PFT Performed Today

## 2023-07-22 NOTE — Patient Instructions (Signed)
 Full PFT Performed Today

## 2023-07-27 NOTE — Progress Notes (Signed)
 Tried to contact pt but mailbox is full at this time.

## 2023-08-02 ENCOUNTER — Encounter (HOSPITAL_BASED_OUTPATIENT_CLINIC_OR_DEPARTMENT_OTHER): Payer: Self-pay

## 2023-08-02 NOTE — Progress Notes (Signed)
 Mychart message sent.

## 2023-08-03 MED ORDER — BUDESONIDE-FORMOTEROL FUMARATE 160-4.5 MCG/ACT IN AERO
2.0000 | INHALATION_SPRAY | Freq: Two times a day (BID) | RESPIRATORY_TRACT | 6 refills | Status: DC
Start: 2023-08-03 — End: 2024-03-22

## 2023-08-03 NOTE — Telephone Encounter (Signed)
 I have sent this rx to pharmacy, can you advise on lung loss?

## 2023-08-09 NOTE — Progress Notes (Unsigned)
   Acute Office Visit  Subjective:     Patient ID: Bianca Haley, female    DOB: Dec 26, 1950, 73 y.o.   MRN: [id number]  No chief complaint on file.   HPI Patient is in today for ***  ROS Per HPI      Objective:    There were no vitals taken for this visit.   Physical Exam Vitals and nursing note reviewed.  Constitutional:      General: She is not in acute distress.    Comments: Appears fatigued  HENT:     Head: Normocephalic and atraumatic.     Right Ear: External ear normal.     Left Ear: External ear normal.     Nose: No congestion.     Mouth/Throat:     Mouth: Mucous membranes are moist.     Pharynx: Oropharynx is clear. No oropharyngeal exudate or posterior oropharyngeal erythema.  Eyes:     Extraocular Movements: Extraocular movements intact.  Cardiovascular:     Rate and Rhythm: Normal rate and regular rhythm.     Heart sounds: Normal heart sounds.  Pulmonary:     Effort: Pulmonary effort is normal. No respiratory distress.     Breath sounds: No wheezing, rhonchi or rales.  Musculoskeletal:     Cervical back: Normal range of motion and neck supple.  Lymphadenopathy:     Cervical: No cervical adenopathy.  Skin:    General: Skin is warm and dry.  Neurological:     General: No focal deficit present.     Mental Status: She is alert and oriented to person, place, and time.   No results found for any visits on 08/10/23.      Assessment & Plan:   Acute cough  Vomiting, unspecified vomiting type, unspecified whether nausea present     No orders of the defined types were placed in this encounter.   No follow-ups on file.  Sherald Barge, FNP

## 2023-08-10 ENCOUNTER — Encounter: Payer: Self-pay | Admitting: Family Medicine

## 2023-08-10 ENCOUNTER — Ambulatory Visit (INDEPENDENT_AMBULATORY_CARE_PROVIDER_SITE_OTHER): Admitting: Family Medicine

## 2023-08-10 VITALS — BP 134/70 | HR 69 | Temp 97.7°F | Ht 63.0 in | Wt 143.2 lb

## 2023-08-10 DIAGNOSIS — R1110 Vomiting, unspecified: Secondary | ICD-10-CM

## 2023-08-10 DIAGNOSIS — R051 Acute cough: Secondary | ICD-10-CM

## 2023-08-10 DIAGNOSIS — J019 Acute sinusitis, unspecified: Secondary | ICD-10-CM

## 2023-08-10 DIAGNOSIS — B9689 Other specified bacterial agents as the cause of diseases classified elsewhere: Secondary | ICD-10-CM | POA: Diagnosis not present

## 2023-08-10 MED ORDER — DOXYCYCLINE HYCLATE 100 MG PO CAPS: 100.0000 mg | ORAL_CAPSULE | Freq: Two times a day (BID) | ORAL | 0 refills | Status: AC

## 2023-08-10 NOTE — Patient Instructions (Signed)
 I have sent in a prescription for doxycycline for you to take twice a day for 7 days.  Take this medication with food as it can upset your stomach if you do not.  Continue supportive care at home.  Follow-up with me for new or worsening symptoms.

## 2023-08-16 ENCOUNTER — Other Ambulatory Visit: Payer: Self-pay | Admitting: Family Medicine

## 2023-08-16 DIAGNOSIS — B9689 Other specified bacterial agents as the cause of diseases classified elsewhere: Secondary | ICD-10-CM

## 2023-08-16 NOTE — Telephone Encounter (Signed)
 Copied from CRM [id number]. Topic: Clinical - Medication Refill >> Aug 16, 2023 12:07 PM Orien Bird wrote: Most Recent Primary Care Visit:  Provider: Wellington Half  Department: [facility]  Visit Type: OFFICE VISIT  Date: 08/10/2023  Medication: doxycycline (VIBRAMYCIN) 100 MG capsule  Has the patient contacted their pharmacy? No (Agent: If no, request that the patient contact the pharmacy for the refill. If patient does not wish to contact the pharmacy document the reason why and proceed with request.) (Agent: If yes, when and what did the pharmacy advise?)  Is this the correct pharmacy for this prescription? Yes If no, delete pharmacy and type the correct one.  This is the patient's preferred pharmacy:  [facility] [id number] [place], [place] - [address] [place] [address] Phone: [phone] Fax: [phone]   Has the prescription been filled recently? Yes  Is the patient out of the medication? Yes  Has the patient been seen for an appointment in the last year OR does the patient have an upcoming appointment? Yes  Can we respond through MyChart? Yes  Agent: Please be advised that Rx refills may take up to 3 business days. We ask that you follow-up with your pharmacy.

## 2023-08-17 ENCOUNTER — Other Ambulatory Visit: Payer: Self-pay | Admitting: Family Medicine

## 2023-08-17 DIAGNOSIS — B9689 Other specified bacterial agents as the cause of diseases classified elsewhere: Secondary | ICD-10-CM

## 2023-08-18 ENCOUNTER — Other Ambulatory Visit: Payer: Self-pay | Admitting: Internal Medicine

## 2023-08-19 ENCOUNTER — Other Ambulatory Visit: Payer: Self-pay | Admitting: Internal Medicine

## 2023-08-19 DIAGNOSIS — J189 Pneumonia, unspecified organism: Secondary | ICD-10-CM | POA: Diagnosis not present

## 2023-08-23 ENCOUNTER — Ambulatory Visit (INDEPENDENT_AMBULATORY_CARE_PROVIDER_SITE_OTHER)

## 2023-08-23 ENCOUNTER — Ambulatory Visit (INDEPENDENT_AMBULATORY_CARE_PROVIDER_SITE_OTHER): Admitting: Internal Medicine

## 2023-08-23 ENCOUNTER — Telehealth: Payer: Self-pay | Admitting: Internal Medicine

## 2023-08-23 ENCOUNTER — Encounter: Payer: Self-pay | Admitting: Internal Medicine

## 2023-08-23 VITALS — BP 122/80 | HR 63 | Temp 98.6°F | Ht 63.0 in | Wt 140.0 lb

## 2023-08-23 DIAGNOSIS — R918 Other nonspecific abnormal finding of lung field: Secondary | ICD-10-CM | POA: Diagnosis not present

## 2023-08-23 DIAGNOSIS — R051 Acute cough: Secondary | ICD-10-CM

## 2023-08-23 DIAGNOSIS — R110 Nausea: Secondary | ICD-10-CM | POA: Insufficient documentation

## 2023-08-23 DIAGNOSIS — R059 Cough, unspecified: Secondary | ICD-10-CM | POA: Diagnosis not present

## 2023-08-23 DIAGNOSIS — R0602 Shortness of breath: Secondary | ICD-10-CM | POA: Diagnosis not present

## 2023-08-23 DIAGNOSIS — I10 Essential (primary) hypertension: Secondary | ICD-10-CM | POA: Diagnosis not present

## 2023-08-23 MED ORDER — ONDANSETRON 4 MG PO TBDP: 4.0000 mg | ORAL_TABLET | Freq: Three times a day (TID) | ORAL | 1 refills | Status: DC | PRN

## 2023-08-23 MED ORDER — HYDROCODONE BIT-HOMATROP MBR 5-1.5 MG/5ML PO SOLN: 5.0000 mL | Freq: Four times a day (QID) | ORAL | 0 refills | Status: AC | PRN

## 2023-08-23 MED ORDER — LEVOFLOXACIN 500 MG PO TABS: 500.0000 mg | ORAL_TABLET | Freq: Every day | ORAL | 0 refills | Status: DC

## 2023-08-23 NOTE — Assessment & Plan Note (Signed)
 Likely related to cough illness, for zofran  odt prn

## 2023-08-23 NOTE — Patient Instructions (Signed)
 Please take all new medication as prescribed - the antibiotic, cough medicine, and zofran  for nausea  Please continue all other medications as before, and refills have been done if requested.  Please have the pharmacy call with any other refills you may need.  Please keep your appointments with your specialists as you may have planned  Please go to the XRAY Department in the first floor for the x-ray testing  You will be contacted by phone if any changes need to be made immediately.  Otherwise, you will receive a letter about your results with an explanation, but please check with MyChart first.

## 2023-08-23 NOTE — Assessment & Plan Note (Signed)
 Persistent x 2 wks, can't r/o bronchitis vs pna, for cxr, also levaquin  500 every day, cough med prn

## 2023-08-23 NOTE — Progress Notes (Signed)
 Patient ID: Bianca Haley, female   DOB: December 26, 1950, 73 y.o.   MRN: [id number]        Chief Complaint: follow up persistent cough x 2 wks       HPI:  Bianca Haley is a 73 y.o. female here with c/o 2 wks onset acute cough mild productive after initial ST x 3 days that seemed to resolve   Did have 8 days of doxycyline 100 bid but not improved. Has had mild sob, can't sleep, conts to feel poorly, nausea, fatigue, low appetite.  Pt denies chest pain, wheezing, orthopnea, PND, increased LE swelling, palpitations, dizziness or syncope.   Pt denies fever, wt loss, night sweats, loss of appetite, or other constitutional symptoms  Has hx of recurrent pna.       Wt Readings from Last 3 Encounters:  08/23/23 140 lb (63.5 kg)  08/10/23 143 lb 3.2 oz (65 kg)  07/22/23 141 lb 9.6 oz (64.2 kg)   BP Readings from Last 3 Encounters:  08/23/23 122/80  08/10/23 134/70  07/21/23 120/78         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   Hemorrhoids    History of colon polyps    History of endometriosis    and fibroids   History of hiatal hernia    History of UTI    Hypertension    Memory loss    Migraine    OSA (obstructive sleep apnea)    on nocturnal O2   Recurrent pneumonia 07/2018   Seasonal allergies    Vitamin D  deficiency    Wears glasses    Past Surgical History:  Procedure Laterality Date   ABDOMINAL HYSTERECTOMY     BACK SURGERY     BIOPSY  10/20/2018   Procedure: BIOPSY;  Surgeon: Janel Medford, MD;  Location: [facility];  Service: Endoscopy;;   BLADDER SUSPENSION     COLONOSCOPY  02/16/2008   normal   DENTAL SURGERY     ESOPHAGOGASTRODUODENOSCOPY (EGD)  WITH PROPOFOL  N/A 10/20/2018   Procedure: ESOPHAGOGASTRODUODENOSCOPY (EGD) WITH PROPOFOL ;  Surgeon: Janel Medford, MD;  Location: [facility];  Service: Endoscopy;  Laterality: N/A;   EUS N/A 10/20/2018   Procedure: UPPER ENDOSCOPIC ULTRASOUND (EUS) RADIAL;  Surgeon: Janel Medford, MD;  Location: [facility];  Service: Endoscopy;  Laterality: N/A;   FOOT SURGERY Left    x3   laparoscopy     for evaluation of endometriosis   LEFT HEART CATH AND CORONARY ANGIOGRAPHY N/A 09/04/2021   Procedure: LEFT HEART CATH AND CORONARY ANGIOGRAPHY;  Surgeon: Lucendia Rusk, MD;  Location: [facility] INVASIVE CV LAB;  Service: Cardiovascular;  Laterality: N/A;   TOTAL KNEE ARTHROPLASTY Left 07/15/2015   Procedure: TOTAL LEFT KNEE ARTHROPLASTY;  Surgeon: Claiborne Crew, MD;  Location: [facility];  Service: Orthopedics;  Laterality: Left;   UPPER GI ENDOSCOPY      reports that she has never smoked. She has never used smokeless tobacco. She reports that she does not drink alcohol  and does not use drugs.  family history includes Emphysema in her mother; Heart disease (age of onset: 67) in her mother. Allergies  Allergen Reactions   Penicillins Shortness Of Breath, Rash and Other (See Comments)    Did it involve swelling of the face/tongue/throat, SOB, or low BP? No Did it involve sudden or severe rash/hives, skin peeling, or any reaction on the inside of your mouth or nose? Yes Did you need to seek medical attention at a hospital or doctor's office? No When did it last happen?      10 + years If all above answers are "NO", may proceed with cephalosporin use.     Morphine  And Codeine  Swelling    Sedation/Swelling described like edema/bloating/doesn't help the pain Morphine  only; tolerates other opioids.    Morphine  Other (See Comments)   Cefepime Rash    Reaction while taking both cefepime and vancomycin  at [facility] (after 3 days)   Vancomycin  Rash    Reaction while taking both cefepime and vancomycin  at [facility]  (after 3 days)   Current Outpatient Medications on File Prior to Visit  Medication Sig Dispense Refill   acetaminophen  (TYLENOL ) 650 MG CR tablet Take 1,300 mg by mouth every 8 (eight) hours as needed for pain.     albuterol  (VENTOLIN  HFA) 108 (90 Base) MCG/ACT inhaler INHALE 2 PUFFS INTO THE LUNGS EVERY 4 HOURS AS NEEDED FOR WHEEZING OR SHORTNESS OF BREATH 18 g 5   apixaban  (ELIQUIS ) 5 MG TABS tablet Take 1 tablet (5 mg total) by mouth 2 (two) times daily. 180 tablet 3   Ascorbic Acid (VITAMIN C PO) Take 1 tablet by mouth every morning.     budesonide -formoterol  (SYMBICORT ) 160-4.5 MCG/ACT inhaler Inhale 2 puffs into the lungs 2 (two) times daily. 1 each 6   Calcium  Carb-Cholecalciferol  (CALCIUM  PLUS VITAMIN D3) 600-500 MG-UNIT CAPS Take 2 tablets by mouth every morning.     Cholecalciferol  (VITAMIN D ) 2000 units tablet Take 2,000 Units by mouth at bedtime.      cycloSPORINE  (RESTASIS ) 0.05 % ophthalmic emulsion Place 1 drop into both eyes every morning.     diazepam  (VALIUM ) 5 MG tablet TAKE 1/2 TABLET BY MOUTH TWICE DAILY. OKAY TO TAKE 1/2 TABLET IN MID-DAY IF NEEDED 30 tablet 5   diltiazem  (CARDIZEM  CD) 240 MG 24 hr capsule TAKE 1 CAPSULE BY MOUTH EVERY MORNING 90 capsule 1   fluticasone  (FLONASE ) 50 MCG/ACT nasal spray SPRAY 2 SPRAYS IN EACH NOSTRIL ONCE DAILY AS NEEDED FOR ALLERGIES OR RHINITIS 16 mL 3   hydrocortisone  2.5 % cream Apply twice daily for up to one week as needed for flares 30 g 2   ibandronate (BONIVA) 150 MG tablet Take 150 mg by mouth every 30 (thirty) days.     irbesartan  (AVAPRO ) 150 MG tablet Take 1 tablet (150 mg total) by mouth daily. 14 tablet 0   metoprolol  tartrate (LOPRESSOR ) 50 MG tablet TAKE 1 TABLET BY MOUTH TWICE A DAY 180 tablet 1   Multiple Vitamin (MULTIVITAMIN WITH MINERALS) TABS tablet Take 1 tablet by mouth 2 (two) times daily.     MYRBETRIQ  50 MG TB24 tablet TAKE 1 TABLET BY MOUTH EVERY MORNING 30 tablet 5   naloxone  (NARCAN ) nasal spray 4 mg/0.1 mL 1  actuation in one nostril once. May repeat in 2-3 min 1 each 2   OVER THE COUNTER MEDICATION Take 2 capsules by mouth in the morning. Lions Mane     oxyCODONE -acetaminophen  (PERCOCET/ROXICET) 5-325 MG tablet Take 1 tablet by mouth every 8 (eight) hours  as needed for severe pain (pain score 7-10). 90 tablet 0   oxyCODONE -acetaminophen  (PERCOCET/ROXICET) 5-325 MG tablet Take 1 tablet by mouth every 8 (eight) hours as needed for severe pain (pain score 7-10). 90 tablet 0   pantoprazole  (PROTONIX ) 40 MG tablet Take 1 tablet (40 mg total) by mouth 2 (two) times daily. 60180 tablet 3   PARoxetine  (PAXIL ) 40 MG tablet TAKE 1 TABLET BY MOUTH 2 TIMES A DAY 60 tablet 5   potassium chloride  (KLOR-CON ) 10 MEQ tablet TAKE 1 TABLET BY MOUTH DAILY 90 tablet 3   Probiotic CAPS Take 1 capsule by mouth every morning.     rosuvastatin  (CRESTOR ) 5 MG tablet Take 1 tablet (5 mg total) by mouth daily. 90 tablet 1   SUMAtriptan  (IMITREX ) 100 MG tablet TAKE 1 TABLET BY MOUTH ONCE. MAY REPEAT IN 2 HOURS IF HEADACHE PERSISTS OR RECURS 9 tablet 3   traZODone  (DESYREL ) 150 MG tablet TAKE 1 TABLET BY MOUTH EVERY NIGHT AT BEDTIME AS NEEDED FOR SLEEP 90 tablet 0   valACYclovir (VALTREX) 1000 MG tablet Take 1/2 tablet(s) bid by oral route X 3- 5days prn     vitamin B-12 (CYANOCOBALAMIN ) 1000 MCG tablet Take 1,000 mcg by mouth every morning.     No current facility-administered medications on file prior to visit.        ROS:  All others reviewed and negative.  Objective        PE:  BP 122/80 (BP Location: Right Arm, Patient Position: Sitting, Cuff Size: Normal)   Pulse 63   Temp 98.6 F (37 C) (Oral)   Ht 5\' 3"  (1.6 m)   Wt 140 lb (63.5 kg)   SpO2 97%   BMI 24.80 kg/m                 Constitutional: Pt appears                HENT: Head: NCAT.                Right Ear: External ear normal.                 Left Ear: External ear normal.                Eyes: . Pupils are equal, round, and reactive to light.  Conjunctivae and EOM are normal               Nose: without d/c or deformity               Neck: Neck supple. Gross normal ROM               Cardiovascular: Normal rate and regular rhythm.                 Pulmonary/Chest: Effort normal and breath sounds decreased without rales or wheezing.                Abd:  Soft, NT, ND, + BS, no organomegaly               Neurological: Pt is alert. At baseline orientation, motor grossly intact               Skin: Skin is warm. No rashes, no other new lesions, LE edema - none               Psychiatric: Pt behavior is normal without agitation   Micro: none  Cardiac tracings I have personally interpreted  today:  none  Pertinent Radiological findings (summarize): none   Lab Results  Component Value Date   WBC 5.0 02/15/2023   HGB 13.2 02/15/2023   HCT 40.9 02/15/2023   PLT 249.0 02/15/2023   GLUCOSE 88 05/27/2023   CHOL 150 02/15/2023   TRIG 67.0 02/15/2023   HDL 63.10 02/15/2023   LDLCALC 73 02/15/2023   ALT 9 03/11/2022   AST 20 03/11/2022   NA 142 05/27/2023   K 4.0 05/27/2023   CL 100 05/27/2023   CREATININE 0.76 05/27/2023   BUN 13 05/27/2023   CO2 27 05/27/2023   TSH 1.64 02/15/2023   INR 1.0 09/21/2018   HGBA1C 6.1 06/23/2018   Assessment/Plan:  Bianca Haley is a 72 y.o. White or Caucasian [1] female with  has a past medical history of Anemia, Anxiety, Arthritis, Chronic low back pain, Chronic renal failure, Diverticulosis, Dupuytren contracture, GERD (gastroesophageal reflux disease), H/O tinnitus, Hearing loss, Hemorrhoids, History of colon polyps, History of endometriosis, History of hiatal hernia, History of UTI, Hypertension, Memory loss, Migraine, OSA (obstructive sleep apnea), Recurrent pneumonia (07/2018), Seasonal allergies, Vitamin D  deficiency, and Wears glasses.  Cough Persistent x 2 wks, can't r/o bronchitis vs pna, for cxr, also levaquin  500 every day, cough med prn  Nausea Likely related to cough illness, for  zofran  odt prn  Essential hypertension BP Readings from Last 3 Encounters:  08/23/23 122/80  08/10/23 134/70  07/21/23 120/78   Stable, pt to continue medical treatment card cd 240 every day, avapro  150 every day, lopressor  50 bid  Followup: Return if symptoms worsen or fail to improve.  Rosalia Colonel, MD 08/23/2023 10:38 AM [facility]

## 2023-08-23 NOTE — Telephone Encounter (Signed)
 Copied from CRM [id number]. Topic: Clinical - Medication Question >> Aug 23, 2023 10:39 AM Luane Rumps D wrote: Reason for CRM: Jolena Nay with [facility] calling about Ms. Punches's HYDROcodone  bit-homatropine (HYCODAN) 5-1.5 MG/5ML syrup and was wondering if Dr. Georgia Kipper spoke with the patient regarding spacing with her oxyCODONE -acetaminophen  (PERCOCET/ROXICET) 5-325 MG tablet.

## 2023-08-23 NOTE — Assessment & Plan Note (Signed)
 BP Readings from Last 3 Encounters:  08/23/23 122/80  08/10/23 134/70  07/21/23 120/78   Stable, pt to continue medical treatment card cd 240 every day, avapro  150 every day, lopressor  50 bid

## 2023-08-26 NOTE — Telephone Encounter (Signed)
**Note De-identified  [place] Obfuscation** Please advise 

## 2023-08-30 NOTE — Telephone Encounter (Signed)
 Dr. Autry Legions prescribed the cough syrup.  He probably gave the patient proper instructions. Thanks

## 2023-08-31 ENCOUNTER — Encounter: Payer: Self-pay | Admitting: Internal Medicine

## 2023-08-31 DIAGNOSIS — J449 Chronic obstructive pulmonary disease, unspecified: Secondary | ICD-10-CM | POA: Insufficient documentation

## 2023-09-17 ENCOUNTER — Other Ambulatory Visit: Payer: Self-pay | Admitting: Internal Medicine

## 2023-09-18 DIAGNOSIS — J189 Pneumonia, unspecified organism: Secondary | ICD-10-CM | POA: Diagnosis not present

## 2023-09-20 ENCOUNTER — Other Ambulatory Visit: Payer: Self-pay | Admitting: Internal Medicine

## 2023-09-28 DIAGNOSIS — M542 Cervicalgia: Secondary | ICD-10-CM | POA: Diagnosis not present

## 2023-09-28 DIAGNOSIS — M5412 Radiculopathy, cervical region: Secondary | ICD-10-CM | POA: Diagnosis not present

## 2023-10-15 ENCOUNTER — Other Ambulatory Visit: Payer: Self-pay | Admitting: Internal Medicine

## 2023-10-15 NOTE — Telephone Encounter (Unsigned)
 Copied from CRM [id number]. Topic: Clinical - Medication Refill >> Oct 15, 2023 12:23 PM Baldo Levan wrote: Medication:  oxyCODONE -acetaminophen  oxyCODONE -acetaminophen  (PERCOCET/ROXICET) 5-325 MG tablet   Has the patient contacted their pharmacy? Yes (Agent: If no, request that the patient contact the pharmacy for the refill. If patient does not wish to contact the pharmacy document the reason why and proceed with request.) (Agent: If yes, when and what did the pharmacy advise?)  This is the patient's preferred pharmacy:  [facility] [id number] [place], [place] - [address] [place] [address] Phone: [phone] Fax: [phone]  Is this the correct pharmacy for this prescription? Yes If no, delete pharmacy and type the correct one.   Has the prescription been filled recently? No  Is the patient out of the medication? Yes  Has the patient been seen for an appointment in the last year OR does the patient have an upcoming appointment? Yes  Can we respond through MyChart? Yes  Agent: Please be advised that Rx refills may take up to 3 business days. We ask that you follow-up with your pharmacy.

## 2023-10-17 MED ORDER — OXYCODONE-ACETAMINOPHEN 5-325 MG PO TABS: 1.0000 | ORAL_TABLET | Freq: Three times a day (TID) | ORAL | 0 refills | Status: DC | PRN

## 2023-10-19 DIAGNOSIS — J189 Pneumonia, unspecified organism: Secondary | ICD-10-CM | POA: Diagnosis not present

## 2023-10-20 ENCOUNTER — Encounter: Payer: Self-pay | Admitting: Internal Medicine

## 2023-10-20 ENCOUNTER — Ambulatory Visit (INDEPENDENT_AMBULATORY_CARE_PROVIDER_SITE_OTHER): Admitting: Internal Medicine

## 2023-10-20 VITALS — BP 126/80 | HR 58 | Temp 99.4°F | Ht 63.0 in | Wt 145.0 lb

## 2023-10-20 DIAGNOSIS — I10 Essential (primary) hypertension: Secondary | ICD-10-CM

## 2023-10-20 DIAGNOSIS — J189 Pneumonia, unspecified organism: Secondary | ICD-10-CM

## 2023-10-20 DIAGNOSIS — L570 Actinic keratosis: Secondary | ICD-10-CM

## 2023-10-20 DIAGNOSIS — H9313 Tinnitus, bilateral: Secondary | ICD-10-CM | POA: Diagnosis not present

## 2023-10-20 DIAGNOSIS — N183 Chronic kidney disease, stage 3 unspecified: Secondary | ICD-10-CM

## 2023-10-20 MED ORDER — OXYCODONE-ACETAMINOPHEN 5-325 MG PO TABS: 1.0000 | ORAL_TABLET | Freq: Three times a day (TID) | ORAL | 0 refills | Status: DC | PRN

## 2023-10-20 NOTE — Patient Instructions (Signed)
 Levaquin  worked well for you

## 2023-10-20 NOTE — Addendum Note (Signed)
 Addended by: Carissa Musick V on: 10/20/2023 04:08 PM   Modules accepted: Level of Service

## 2023-10-20 NOTE — Assessment & Plan Note (Signed)
Stable. Cont w/Atenolol, Diltiazem,  Maxzide 

## 2023-10-20 NOTE — Progress Notes (Addendum)
 Subjective:  Patient ID: Bianca Haley, female    DOB: 10/22/1950  Age: 73 y.o. MRN: [id number]  CC: Medical Management of Chronic Issues (6 month F/u feeling ok still get shortness of breathe ..want to discuss exercise to increase her lung capacity... Pt States has ringing in both ear worse in the left. Pt has a patch from the sun she wants looked at )   HPI Bianca Haley presents for chronic pain, HTN, dyslipidemia C/o skin lesion  Outpatient Medications Prior to Visit  Medication Sig Dispense Refill   acetaminophen  (TYLENOL ) 650 MG CR tablet Take 1,300 mg by mouth every 8 (eight) hours as needed for pain.     albuterol  (VENTOLIN  HFA) 108 (90 Base) MCG/ACT inhaler INHALE 2 PUFFS INTO THE LUNGS EVERY 4 HOURS AS NEEDED FOR WHEEZING OR SHORTNESS OF BREATH 18 g 5   apixaban  (ELIQUIS ) 5 MG TABS tablet Take 1 tablet (5 mg total) by mouth 2 (two) times daily. 180 tablet 3   Ascorbic Acid (VITAMIN C PO) Take 1 tablet by mouth every morning.     budesonide -formoterol  (SYMBICORT ) 160-4.5 MCG/ACT inhaler Inhale 2 puffs into the lungs 2 (two) times daily. 1 each 6   Calcium  Carb-Cholecalciferol  (CALCIUM  PLUS VITAMIN D3) 600-500 MG-UNIT CAPS Take 2 tablets by mouth every morning.     Cholecalciferol  (VITAMIN D ) 2000 units tablet Take 2,000 Units by mouth at bedtime.      cycloSPORINE  (RESTASIS ) 0.05 % ophthalmic emulsion Place 1 drop into both eyes every morning.     diazepam  (VALIUM ) 5 MG tablet TAKE 1/2 TABLET BY MOUTH TWICE DAILY. OKAY TO TAKE 1/2 TABLET IN MID-DAY IF NEEDED 30 tablet 5   diltiazem  (CARDIZEM  CD) 240 MG 24 hr capsule TAKE 1 CAPSULE BY MOUTH EVERY MORNING 90 capsule 1   fluticasone  (FLONASE ) 50 MCG/ACT nasal spray SPRAY 2 SPRAYS IN EACH NOSTRIL ONCE DAILY AS NEEDED FOR ALLERGIES OR RHINITIS 16 mL 3   hydrocortisone  2.5 % cream Apply twice daily for up to one week as needed for flares 30 g 2   ibandronate (BONIVA) 150 MG tablet Take 150 mg by mouth every 30 (thirty) days.      irbesartan  (AVAPRO ) 150 MG tablet Take 1 tablet (150 mg total) by mouth daily. 14 tablet 0   levofloxacin  (LEVAQUIN ) 500 MG tablet Take 1 tablet (500 mg total) by mouth daily. 10 tablet 0   metoprolol  tartrate (LOPRESSOR ) 50 MG tablet TAKE 1 TABLET BY MOUTH TWICE A DAY 180 tablet 1   Multiple Vitamin (MULTIVITAMIN WITH MINERALS) TABS tablet Take 1 tablet by mouth 2 (two) times daily.     MYRBETRIQ  50 MG TB24 tablet TAKE 1 TABLET BY MOUTH EVERY MORNING 30 tablet 5   naloxone  (NARCAN ) nasal spray 4 mg/0.1 mL 1 actuation in one nostril once. May repeat in 2-3 min 1 each 2   ondansetron  (ZOFRAN -ODT) 4 MG disintegrating tablet Take 1 tablet (4 mg total) by mouth every 8 (eight) hours as needed. 30 tablet 1   OVER THE COUNTER MEDICATION Take 2 capsules by mouth in the morning. Lions Mane     pantoprazole  (PROTONIX ) 40 MG tablet Take 1 tablet (40 mg total) by mouth 2 (two) times daily. 60180 tablet 3   PARoxetine  (PAXIL ) 40 MG tablet TAKE 1 TABLET BY MOUTH 2 TIMES A DAY 60 tablet 5   potassium chloride  (KLOR-CON ) 10 MEQ tablet TAKE 1 TABLET BY MOUTH DAILY 90 tablet 3   Probiotic CAPS Take 1 capsule by  mouth every morning.     rosuvastatin  (CRESTOR ) 5 MG tablet TAKE 1 TABLET BY MOUTH DAILY 90 tablet 1   SUMAtriptan  (IMITREX ) 100 MG tablet TAKE 1 TABLET BY MOUTH ONCE. MAY REPEAT IN 2 HOURS IF HEADACHE PERSISTS OR RECURS 9 tablet 3   traZODone  (DESYREL ) 150 MG tablet TAKE 1 TABLET BY MOUTH EVERY NIGHT AT BEDTIME AS NEEDED FOR SLEEP 90 tablet 0   valACYclovir (VALTREX) 1000 MG tablet Take 1/2 tablet(s) bid by oral route X 3- 5days prn     vitamin B-12 (CYANOCOBALAMIN ) 1000 MCG tablet Take 1,000 mcg by mouth every morning.     oxyCODONE -acetaminophen  (PERCOCET/ROXICET) 5-325 MG tablet Take 1 tablet by mouth every 8 (eight) hours as needed for severe pain (pain score 7-10). 90 tablet 0   oxyCODONE -acetaminophen  (PERCOCET/ROXICET) 5-325 MG tablet Take 1 tablet by mouth every 8 (eight) hours as needed for severe  pain (pain score 7-10). 90 tablet 0   No facility-administered medications prior to visit.    ROS: Review of Systems  Constitutional:  Negative for activity change, appetite change, chills, fatigue and unexpected weight change.  HENT:  Negative for congestion, mouth sores and sinus pressure.   Eyes:  Negative for visual disturbance.  Respiratory:  Negative for cough and chest tightness.   Gastrointestinal:  Negative for abdominal pain and nausea.  Genitourinary:  Negative for difficulty urinating, frequency and vaginal pain.  Musculoskeletal:  Positive for back pain. Negative for gait problem.  Skin:  Negative for pallor and rash.  Neurological:  Negative for dizziness, tremors, weakness, numbness and headaches.  Psychiatric/Behavioral:  Negative for confusion, sleep disturbance and suicidal ideas.     Objective:  BP 126/80   Pulse (!) 58   Temp 99.4 F (37.4 C) (Oral)   Ht 5' 3 (1.6 m)   Wt 145 lb (65.8 kg)   SpO2 92%   BMI 25.69 kg/m   BP Readings from Last 3 Encounters:  10/20/23 126/80  08/23/23 122/80  08/10/23 134/70    Wt Readings from Last 3 Encounters:  10/20/23 145 lb (65.8 kg)  08/23/23 140 lb (63.5 kg)  08/10/23 143 lb 3.2 oz (65 kg)    Physical Exam Constitutional:      General: She is not in acute distress.    Appearance: She is well-developed.  HENT:     Head: Normocephalic.     Right Ear: External ear normal.     Left Ear: External ear normal.     Nose: Nose normal.   Eyes:     General:        Right eye: No discharge.        Left eye: No discharge.     Conjunctiva/sclera: Conjunctivae normal.     Pupils: Pupils are equal, round, and reactive to light.   Neck:     Thyroid : No thyromegaly.     Vascular: No JVD.     Trachea: No tracheal deviation.   Cardiovascular:     Rate and Rhythm: Normal rate and regular rhythm.     Heart sounds: Normal heart sounds.  Pulmonary:     Effort: No respiratory distress.     Breath sounds: No stridor.  No wheezing.  Abdominal:     General: Bowel sounds are normal. There is no distension.     Palpations: Abdomen is soft. There is no mass.     Tenderness: There is no abdominal tenderness. There is no guarding or rebound.   Musculoskeletal:  General: No tenderness.     Cervical back: Normal range of motion and neck supple. No rigidity.     Right lower leg: No edema.     Left lower leg: No edema.  Lymphadenopathy:     Cervical: No cervical adenopathy.   Skin:    Findings: No erythema or rash.   Neurological:     Mental Status: She is oriented to person, place, and time.     Cranial Nerves: No cranial nerve deficit.     Motor: No abnormal muscle tone.     Coordination: Coordination normal.     Gait: Gait abnormal.     Deep Tendon Reflexes: Reflexes normal.   Psychiatric:        Behavior: Behavior normal.        Thought Content: Thought content normal.        Judgment: Judgment normal.   LS w/pain AK on L arm   Procedure Note :     Procedure : Cryosurgery   Indication:    Actinic keratosis(es)   Risks including unsuccessful procedure , bleeding, infection, bruising, scar, a need for a repeat  procedure and others were explained to the patient in detail as well as the benefits. Informed consent was obtained verbally.   1  lesion(s)  on  L arm was/were treated with liquid nitrogen on a Q-tip in a usual fasion . Band-Aid was applied and antibiotic ointment was given for a later use.   Tolerated well. Complications none.   Postprocedure instructions :     Keep the wounds clean. You can wash them with liquid soap and water . Pat dry with gauze or a Kleenex tissue  Before applying antibiotic ointment and a Band-Aid.   You need to report immediately  if  any signs of infection develop.   Lab Results  Component Value Date   WBC 5.0 02/15/2023   HGB 13.2 02/15/2023   HCT 40.9 02/15/2023   PLT 249.0 02/15/2023   GLUCOSE 88 05/27/2023   CHOL 150 02/15/2023   TRIG 67.0  02/15/2023   HDL 63.10 02/15/2023   LDLCALC 73 02/15/2023   ALT 9 03/11/2022   AST 20 03/11/2022   NA 142 05/27/2023   K 4.0 05/27/2023   CL 100 05/27/2023   CREATININE 0.76 05/27/2023   BUN 13 05/27/2023   CO2 27 05/27/2023   TSH 1.64 02/15/2023   INR 1.0 09/21/2018   HGBA1C 6.1 06/23/2018    CT Chest Wo Contrast Result Date: 06/10/2023 CLINICAL DATA:  Followup abnormal chest CT with micro nodularity. EXAM: CT CHEST WITHOUT CONTRAST TECHNIQUE: Multidetector CT imaging of the chest was performed following the standard protocol without IV contrast. RADIATION DOSE REDUCTION: This exam was performed according to the departmental dose-optimization program which includes automated exposure control, adjustment of the mA and/or kV according to patient size and/or use of iterative reconstruction technique. COMPARISON:  Chest CT 09/07/2022 FINDINGS: Cardiovascular: The heart is normal in size. No pericardial effusion. Stable mild tortuosity and calcification of the thoracic aorta. Mediastinum/Nodes: Stable borderline enlarged mediastinal lymph nodes. 14 mm precarinal node is unchanged. No new or progressive findings. The esophagus is unremarkable. Lungs/Pleura: Stable underlying emphysematous changes and areas of pulmonary scarring. Stable areas of irregular bronchial wall thickening, mild bronchiectasis and tree in bud type nodularity. Findings consistent with chronic inflammation or atypical infection such as MAC. No discrete worrisome pulmonary lesions or pulmonary nodules. No focal airspace consolidation, pleural effusion or pleural lesions. Upper Abdomen: No significant upper abdominal  findings. Musculoskeletal: No significant bony findings. Stable scoliosis and exaggerated thoracic kyphosis. Fused midthoracic vertebral bodies again noted. IMPRESSION: 1. Stable underlying emphysematous changes and areas of pulmonary scarring. 2. Stable areas of irregular bronchial wall thickening, mild bronchiectasis and  tree in bud type nodularity. Findings consistent with chronic inflammation or atypical infection such as MAC. 3. No worrisome pulmonary lesions or nodules. 4. Stable borderline enlarged mediastinal lymph nodes. This is likely reactive given the underlying lung changes. No new or progressive findings. Electronically Signed   By: Marrian Siva M.D.   On: 06/10/2023 21:56    Assessment & Plan:   Problem List Items Addressed This Visit     TINNITUS, LEFT   Essential hypertension   Stable. Cont w/Atenolol , Diltiazem ,  Maxzide      CRI (chronic renal insufficiency), stage 3 (moderate) (HCC)   Cont w/good hydration Monitor GFR      Recurrent pneumonia - Primary   Levaquin  worked well for her Doxy is ok - she needs >10 days      Actinic keratosis   L arm See procedure         Meds ordered this encounter  Medications   oxyCODONE -acetaminophen  (PERCOCET/ROXICET) 5-325 MG tablet    Sig: Take 1 tablet by mouth every 8 (eight) hours as needed for severe pain (pain score 7-10).    Dispense:  90 tablet    Refill:  0    Please fill on or after 11/04/23   oxyCODONE -acetaminophen  (PERCOCET/ROXICET) 5-325 MG tablet    Sig: Take 1 tablet by mouth every 8 (eight) hours as needed for severe pain (pain score 7-10).    Dispense:  90 tablet    Refill:  0    Please fill on or after 12/03/23      Follow-up: Return in about 2 months (around 12/20/2023) for a follow-up visit.  Anitra Barn, MD

## 2023-10-20 NOTE — Assessment & Plan Note (Addendum)
 Levaquin  worked well for her Doxy is ok - she needs >10 days

## 2023-10-20 NOTE — Assessment & Plan Note (Signed)
 L arm See procedure

## 2023-10-20 NOTE — Assessment & Plan Note (Signed)
 Cont w/good hydration Monitor GFR

## 2023-10-21 DIAGNOSIS — H02201 Unspecified lagophthalmos right upper eyelid: Secondary | ICD-10-CM | POA: Diagnosis not present

## 2023-10-21 DIAGNOSIS — H04123 Dry eye syndrome of bilateral lacrimal glands: Secondary | ICD-10-CM | POA: Diagnosis not present

## 2023-10-21 DIAGNOSIS — H02204 Unspecified lagophthalmos left upper eyelid: Secondary | ICD-10-CM | POA: Diagnosis not present

## 2023-10-26 ENCOUNTER — Other Ambulatory Visit: Payer: Self-pay | Admitting: Internal Medicine

## 2023-10-27 DIAGNOSIS — Z1231 Encounter for screening mammogram for malignant neoplasm of breast: Secondary | ICD-10-CM | POA: Diagnosis not present

## 2023-10-27 DIAGNOSIS — B009 Herpesviral infection, unspecified: Secondary | ICD-10-CM | POA: Diagnosis not present

## 2023-10-28 ENCOUNTER — Telehealth: Payer: Self-pay

## 2023-10-28 DIAGNOSIS — Z0000 Encounter for general adult medical examination without abnormal findings: Secondary | ICD-10-CM

## 2023-10-28 DIAGNOSIS — I480 Paroxysmal atrial fibrillation: Secondary | ICD-10-CM

## 2023-10-28 DIAGNOSIS — M549 Dorsalgia, unspecified: Secondary | ICD-10-CM

## 2023-10-28 NOTE — Telephone Encounter (Signed)
 Copied from CRM [id number]. Topic: Clinical - Request for Lab/Test Order >> Oct 28, 2023  3:47 PM Berneda FALCON wrote: Reason for CRM: Pt would like to know if her labs are fasting or not. I did not see any current labs in there for her that had any indication of fasting or not. She states she would like to speak to the MA who just had shoulder surgery.  Please call her back at [phone]

## 2023-11-03 NOTE — Telephone Encounter (Signed)
 Fasting labs, however, drink water  16-20 ounces prior to lab work.  Thanks

## 2023-11-04 ENCOUNTER — Other Ambulatory Visit: Payer: Self-pay

## 2023-11-04 DIAGNOSIS — M5412 Radiculopathy, cervical region: Secondary | ICD-10-CM | POA: Diagnosis not present

## 2023-11-04 NOTE — Telephone Encounter (Signed)
Spoke with patient today and info given. 

## 2023-11-11 DIAGNOSIS — M542 Cervicalgia: Secondary | ICD-10-CM | POA: Diagnosis not present

## 2023-11-11 DIAGNOSIS — Z6825 Body mass index (BMI) 25.0-25.9, adult: Secondary | ICD-10-CM | POA: Diagnosis not present

## 2023-11-17 ENCOUNTER — Encounter: Payer: Self-pay | Admitting: Internal Medicine

## 2023-11-17 ENCOUNTER — Ambulatory Visit (INDEPENDENT_AMBULATORY_CARE_PROVIDER_SITE_OTHER): Admitting: Internal Medicine

## 2023-11-17 VITALS — BP 160/68 | HR 59 | Temp 98.6°F | Ht 63.0 in | Wt 146.0 lb

## 2023-11-17 DIAGNOSIS — R7303 Prediabetes: Secondary | ICD-10-CM | POA: Diagnosis not present

## 2023-11-17 DIAGNOSIS — I10 Essential (primary) hypertension: Secondary | ICD-10-CM

## 2023-11-17 DIAGNOSIS — J449 Chronic obstructive pulmonary disease, unspecified: Secondary | ICD-10-CM | POA: Diagnosis not present

## 2023-11-17 DIAGNOSIS — I480 Paroxysmal atrial fibrillation: Secondary | ICD-10-CM | POA: Diagnosis not present

## 2023-11-17 MED ORDER — IRBESARTAN 300 MG PO TABS: 300.0000 mg | ORAL_TABLET | Freq: Every day | ORAL | 3 refills | Status: DC

## 2023-11-17 NOTE — Patient Instructions (Addendum)
 Please take all new medication as prescribed - the avapro  300 mg per day  Please continue all other medications as before, and refills have been done if requested.  Please have the pharmacy call with any other refills you may need.  Please continue your efforts at being more active, low cholesterol diet, and weight control.  Please keep your appointments with your specialists as you may have planned

## 2023-11-17 NOTE — Assessment & Plan Note (Signed)
Stable overall, cont inhaler prn 

## 2023-11-17 NOTE — Assessment & Plan Note (Signed)
 BP Readings from Last 3 Encounters:  11/17/23 (!) 160/68  10/20/23 126/80  08/23/23 122/80   uncontrolled, pt to continue medical treatment card cd 240 every day, lopressor  50 bid, but also increased avapro  to 300 mg qd

## 2023-11-17 NOTE — Progress Notes (Signed)
 Patient ID: Bianca Haley, female   DOB: 04-01-1951, 73 y.o.   MRN: [id number]        Chief Complaint: follow up HTN, PAF, copd, preDM       HPI:  Bianca Haley is a 73 y.o. female here overall doing ok, but has had mild persistent elevated BP at home for several wks, just not improving.  Pt denies chest pain, increased sob or doe, wheezing, orthopnea, PND, increased LE swelling, palpitations, dizziness or syncope.   Pt denies polydipsia, polyuria, or new focal neuro s/s.          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   Hemorrhoids    History of colon polyps    History of endometriosis    and fibroids   History of hiatal hernia    History of UTI    Hypertension    Memory loss    Migraine    OSA (obstructive sleep apnea)    on nocturnal O2   Recurrent pneumonia 07/2018   Seasonal allergies    Vitamin D  deficiency    Wears glasses    Past Surgical History:  Procedure Laterality Date   ABDOMINAL HYSTERECTOMY     BACK SURGERY     BIOPSY  10/20/2018   Procedure: BIOPSY;  Surgeon: Teressa Toribio SQUIBB, MD;  Location: [facility];  Service: Endoscopy;;   BLADDER SUSPENSION     COLONOSCOPY  02/16/2008   normal   DENTAL SURGERY     ESOPHAGOGASTRODUODENOSCOPY (EGD) WITH PROPOFOL  N/A 10/20/2018   Procedure: ESOPHAGOGASTRODUODENOSCOPY (EGD) WITH PROPOFOL ;  Surgeon: Teressa Toribio SQUIBB, MD;  Location: [facility];  Service: Endoscopy;  Laterality: N/A;    EUS N/A 10/20/2018   Procedure: UPPER ENDOSCOPIC ULTRASOUND (EUS) RADIAL;  Surgeon: Teressa Toribio SQUIBB, MD;  Location: [facility];  Service: Endoscopy;  Laterality: N/A;   FOOT SURGERY Left    x3   laparoscopy     for evaluation of endometriosis   LEFT HEART CATH AND CORONARY ANGIOGRAPHY N/A 09/04/2021   Procedure: LEFT HEART CATH AND CORONARY ANGIOGRAPHY;  Surgeon: Dann Candyce RAMAN, MD;  Location: [facility] IN[facility]ASIVE CV LAB;  Service: Cardiovascular;  Laterality: N/A;   TOTAL KNEE ARTHROPLASTY Left 07/15/2015   Procedure: TOTAL LEFT KNEE ARTHROPLASTY;  Surgeon: Donnice Car, MD;  Location: [facility];  Service: Orthopedics;  Laterality: Left;   UPPER GI ENDOSCOPY      reports that she has never smoked. She has never used smokeless tobacco. She reports that she does not drink alcohol  and does not use drugs. family history includes Emphysema in her mother; Heart disease (age of onset: 63) in her mother. Allergies  Allergen Reactions   Penicillins Shortness Of Breath, Rash and Other (See Comments)    Did it  involve swelling of the face/tongue/throat, SOB, or low BP? No Did it involve sudden or severe rash/hives, skin peeling, or any reaction on the inside of your mouth or nose? Yes Did you need to seek medical attention at a hospital or doctor's office? No When did it last happen?      10 + years If all above answers are "NO", may proceed with cephalosporin use.     Morphine  And Codeine  Swelling    Sedation/Swelling described like edema/bloating/doesn't help the pain Morphine  only; tolerates other opioids.    Morphine  Other (See Comments)   Cefepime Rash    Reaction while taking both cefepime and vancomycin  at [facility] (after 3 days)   Vancomycin  Rash    Reaction while taking both cefepime and vancomycin  at [facility] (after 3 days)   Current Outpatient Medications on File Prior to Visit  Medication Sig Dispense Refill   acetaminophen  (TYLENOL ) 650 MG CR tablet Take 1,300 mg by mouth every 8 (eight)  hours as needed for pain.     albuterol  (VENTOLIN  HFA) 108 (90 Base) MCG/ACT inhaler INHALE 2 PUFFS INTO THE LUNGS EVERY 4 HOURS AS NEEDED FOR WHEEZING OR SHORTNESS OF BREATH 18 g 5   apixaban  (ELIQUIS ) 5 MG TABS tablet Take 1 tablet (5 mg total) by mouth 2 (two) times daily. 180 tablet 3   Ascorbic Acid (VITAMIN C PO) Take 1 tablet by mouth every morning.     budesonide -formoterol  (SYMBICORT ) 160-4.5 MCG/ACT inhaler Inhale 2 puffs into the lungs 2 (two) times daily. 1 each 6   Calcium  Carb-Cholecalciferol  (CALCIUM  PLUS VITAMIN D3) 600-500 MG-UNIT CAPS Take 2 tablets by mouth every morning.     Cholecalciferol  (VITAMIN D ) 2000 units tablet Take 2,000 Units by mouth at bedtime.      cycloSPORINE  (RESTASIS ) 0.05 % ophthalmic emulsion Place 1 drop into both eyes every morning.     diazepam  (VALIUM ) 5 MG tablet TAKE 1/2 TABLET BY MOUTH TWICE DAILY. OKAY TO TAKE 1/2 TABLET IN MID-DAY IF NEEDED 30 tablet 5   diltiazem  (CARDIZEM  CD) 240 MG 24 hr capsule TAKE 1 CAPSULE BY MOUTH EVERY MORNING 90 capsule 1   fluticasone  (FLONASE ) 50 MCG/ACT nasal spray SPRAY 2 SPRAYS IN EACH NOSTRIL ONCE DAILY AS NEEDED FOR ALLERGIES OR RHINITIS 16 mL 3   hydrocortisone  2.5 % cream Apply twice daily for up to one week as needed for flares 30 g 2   ibandronate (BONIVA) 150 MG tablet Take 150 mg by mouth every 30 (thirty) days.     levofloxacin  (LEVAQUIN ) 500 MG tablet Take 1 tablet (500 mg total) by mouth daily. 10 tablet 0   metoprolol  tartrate (LOPRESSOR ) 50 MG tablet TAKE 1 TABLET BY MOUTH TWICE A DAY 180 tablet 1   Multiple Vitamin (MULTIVITAMIN WITH MINERALS) TABS tablet Take 1 tablet by mouth 2 (two) times daily.     MYRBETRIQ  50 MG TB24 tablet TAKE 1 TABLET BY MOUTH EVERY MORNING 30 tablet 5   naloxone  (NARCAN ) nasal spray 4 mg/0.1 mL 1 actuation in one nostril once. May repeat in 2-3 min 1 each 2   ondansetron  (ZOFRAN -ODT) 4 MG disintegrating tablet Take 1 tablet (4 mg total) by mouth every 8 (eight) hours as needed.  30 tablet 1   OVER THE COUNTER MEDICATION Take 2 capsules by mouth in the morning. [place]     oxyCODONE -acetaminophen  (PERCOCET/ROXICET) 5-325 MG tablet Take 1 tablet by mouth every 8 (eight) hours as needed for severe pain (pain score 7-10). 90 tablet 0  oxyCODONE -acetaminophen  (PERCOCET/ROXICET) 5-325 MG tablet Take 1 tablet by mouth every 8 (eight) hours as needed for severe pain (pain score 7-10). 90 tablet 0   pantoprazole  (PROTONIX ) 40 MG tablet Take 1 tablet (40 mg total) by mouth 2 (two) times daily. 60180 tablet 3   PARoxetine  (PAXIL ) 40 MG tablet TAKE 1 TABLET BY MOUTH 2 TIMES A DAY 60 tablet 5   potassium chloride  (KLOR-CON ) 10 MEQ tablet TAKE 1 TABLET BY MOUTH DAILY 90 tablet 3   Probiotic CAPS Take 1 capsule by mouth every morning.     rosuvastatin  (CRESTOR ) 5 MG tablet TAKE 1 TABLET BY MOUTH DAILY 90 tablet 1   SUMAtriptan  (IMITREX ) 100 MG tablet TAKE 1 TABLET BY MOUTH ONCE. MAY REPEAT IN 2 HOURS IF HEADACHE PERSISTS OR RECURS 9 tablet 3   traZODone  (DESYREL ) 150 MG tablet TAKE 1 TABLET BY MOUTH EVERY NIGHT AT BEDTIME AS NEEDED FOR SLEEP 90 tablet 0   valACYclovir (VALTREX) 1000 MG tablet Take 1/2 tablet(s) bid by oral route X 3- 5days prn     vitamin B-12 (CYANOCOBALAMIN ) 1000 MCG tablet Take 1,000 mcg by mouth every morning.     No current facility-administered medications on file prior to visit.        ROS:  All others reviewed and negative.  Objective        PE:  BP (!) 160/68 (BP Location: Left Arm, Patient Position: Sitting, Cuff Size: Normal)   Pulse (!) 59   Temp 98.6 F (37 C) (Oral)   Ht 5' 3 (1.6 m)   Wt 146 lb (66.2 kg)   SpO2 92%   BMI 25.86 kg/m                 Constitutional: Pt appears in NAD               HENT: Head: NCAT.                Right Ear: External ear normal.                 Left Ear: External ear normal.                Eyes: . Pupils are equal, round, and reactive to light. Conjunctivae and EOM are normal               Nose: without  d/c or deformity               Neck: Neck supple. Gross normal ROM               Cardiovascular: Normal rate and regular rhythm.                 Pulmonary/Chest: Effort normal and breath sounds without rales or wheezing.                Abd:  Soft, NT, ND, + BS, no organomegaly               Neurological: Pt is alert. At baseline orientation, motor grossly intact               Skin: Skin is warm. No rashes, no other new lesions, LE edema - none               Psychiatric: Pt behavior is normal without agitation   Micro: none  Cardiac tracings I have personally interpreted today:  none  Pertinent Radiological findings (summarize): none   Lab Results  Component Value Date   WBC 5.0 02/15/2023   HGB 13.2 02/15/2023   HCT 40.9 02/15/2023   PLT 249.0 02/15/2023   GLUCOSE 88 05/27/2023   CHOL 150 02/15/2023   TRIG 67.0 02/15/2023   HDL 63.10 02/15/2023   LDLCALC 73 02/15/2023   ALT 9 03/11/2022   AST 20 03/11/2022   NA 142 05/27/2023   K 4.0 05/27/2023   CL 100 05/27/2023   CREATININE 0.76 05/27/2023   BUN 13 05/27/2023   CO2 27 05/27/2023   TSH 1.64 02/15/2023   INR 1.0 09/21/2018   HGBA1C 6.1 06/23/2018   Assessment/Plan:  Bianca Haley is a 73 y.o. White or Caucasian [1] female with  has a past medical history of Anemia, Anxiety, Arthritis, Chronic low back pain, Chronic renal failure, Diverticulosis, Dupuytren contracture, GERD (gastroesophageal reflux disease), H/O tinnitus, Hearing loss, Hemorrhoids, History of colon polyps, History of endometriosis, History of hiatal hernia, History of UTI, Hypertension, Memory loss, Migraine, OSA (obstructive sleep apnea), Recurrent pneumonia (07/2018), Seasonal allergies, Vitamin D  deficiency, and Wears glasses.  PAF (paroxysmal atrial fibrillation) (HCC) Stable volume, pt to continue current ned tx  COPD (chronic obstructive pulmonary disease) (HCC) Stable overall, cont inhaler prn  Prediabetes Lab Results  Component Value Date    HGBA1C 6.1 06/23/2018   Stable, pt to continue current medical treatment  - diet,wt control   Essential hypertension BP Readings from Last 3 Encounters:  11/17/23 (!) 160/68  10/20/23 126/80  08/23/23 122/80   uncontrolled, pt to continue medical treatment card cd 240 every day, lopressor  50 bid, but also increased avapro  to 300 mg qd  Followup: Return if symptoms worsen or fail to improve.  Lynwood Rush, MD 11/17/2023 8:57 PM [facility]

## 2023-11-17 NOTE — Assessment & Plan Note (Signed)
 Stable volume, pt to continue current ned tx

## 2023-11-17 NOTE — Assessment & Plan Note (Signed)
 Lab Results  Component Value Date   HGBA1C 6.1 06/23/2018   Stable, pt to continue current medical treatment  - diet,wt control

## 2023-11-18 DIAGNOSIS — J189 Pneumonia, unspecified organism: Secondary | ICD-10-CM | POA: Diagnosis not present

## 2023-11-29 ENCOUNTER — Other Ambulatory Visit: Payer: Self-pay | Admitting: Internal Medicine

## 2023-11-29 NOTE — Telephone Encounter (Unsigned)
 Copied from CRM [id number]. Topic: Clinical - Medication Refill >> Nov 29, 2023 10:26 AM Wess RAMAN wrote: Medication: traZODone  (DESYREL ) 150 MG tablet   Has the patient contacted their pharmacy? Yes (Agent: If no, request that the patient contact the pharmacy for the refill. If patient does not wish to contact the pharmacy document the reason why and proceed with request.) (Agent: If yes, when and what did the pharmacy advise?)  This is the patient's preferred pharmacy:  [facility] [id number] [place], [place] - [address] [place] [address] Phone: [phone] Fax: [phone]  Is this the correct pharmacy for this prescription? Yes If no, delete pharmacy and type the correct one.   Has the prescription been filled recently? Yes  Is the patient out of the medication? Yes  Has the patient been seen for an appointment in the last year OR does the patient have an upcoming appointment? Yes  Can we respond through MyChart? Yes  Agent: Please be advised that Rx refills may take up to 3 business days. We ask that you follow-up with your pharmacy.

## 2023-11-30 DIAGNOSIS — M4803 Spinal stenosis, cervicothoracic region: Secondary | ICD-10-CM | POA: Diagnosis not present

## 2023-11-30 DIAGNOSIS — M50222 Other cervical disc displacement at C5-C6 level: Secondary | ICD-10-CM | POA: Diagnosis not present

## 2023-11-30 DIAGNOSIS — M542 Cervicalgia: Secondary | ICD-10-CM | POA: Diagnosis not present

## 2023-11-30 DIAGNOSIS — M47812 Spondylosis without myelopathy or radiculopathy, cervical region: Secondary | ICD-10-CM | POA: Diagnosis not present

## 2023-11-30 DIAGNOSIS — M4802 Spinal stenosis, cervical region: Secondary | ICD-10-CM | POA: Diagnosis not present

## 2023-12-01 MED ORDER — TRAZODONE HCL 150 MG PO TABS: 150.0000 mg | ORAL_TABLET | Freq: Every evening | ORAL | 0 refills | Status: DC | PRN

## 2023-12-06 ENCOUNTER — Other Ambulatory Visit: Payer: Self-pay | Admitting: Internal Medicine

## 2023-12-06 NOTE — Telephone Encounter (Unsigned)
 Copied from CRM [id number]. Topic: Clinical - Medication Refill >> Dec 06, 2023  2:24 PM Antonio H wrote: Medication: SUMAtriptan  (IMITREX ) 100 MG tablet  Has the patient contacted their pharmacy? Yes (Agent: If no, request that the patient contact the pharmacy for the refill. If patient does not wish to contact the pharmacy document the reason why and proceed with request.) (Agent: If yes, when and what did the pharmacy advise?) patient has no refills  This is the patient's preferred pharmacy:  [facility] [id number] [place], [place] - [address] [place] [address] Phone: [phone] Fax: [phone]  Is this the correct pharmacy for this prescription? Yes If no, delete pharmacy and type the correct one.   Has the prescription been filled recently? No  Is the patient out of the medication? Yes  Has the patient been seen for an appointment in the last year OR does the patient have an upcoming appointment? Yes  Can we respond through MyChart? Yes  Agent: Please be advised that Rx refills may take up to 3 business days. We ask that you follow-up with your pharmacy.

## 2023-12-07 MED ORDER — SUMATRIPTAN SUCCINATE 100 MG PO TABS: ORAL_TABLET | ORAL | 3 refills | Status: DC

## 2023-12-08 ENCOUNTER — Telehealth: Payer: Self-pay | Admitting: Internal Medicine

## 2023-12-08 DIAGNOSIS — K080 Exfoliation of teeth due to systemic causes: Secondary | ICD-10-CM | POA: Diagnosis not present

## 2023-12-08 NOTE — Telephone Encounter (Signed)
 Copied from CRM #[id number]. Topic: Clinical - Medication Question >> Dec 08, 2023  1:17 PM Precious C wrote: Reason for CRM: Spenser from [employer] called in on behalf of a patient. He inquired if the patient's medication can be filled early, as the patient will be out of town on Friday. He is requesting to fill it Thursday morning instead. He asked for a response via telephone and noted that their phone lines are typically closed for lunch between 2:00 PM and 2:30 PM. Callback number is [phone].

## 2023-12-19 DIAGNOSIS — J189 Pneumonia, unspecified organism: Secondary | ICD-10-CM | POA: Diagnosis not present

## 2023-12-20 ENCOUNTER — Ambulatory Visit: Payer: Self-pay | Admitting: Internal Medicine

## 2023-12-20 ENCOUNTER — Telehealth: Payer: Self-pay

## 2023-12-20 ENCOUNTER — Other Ambulatory Visit (INDEPENDENT_AMBULATORY_CARE_PROVIDER_SITE_OTHER)

## 2023-12-20 ENCOUNTER — Other Ambulatory Visit (HOSPITAL_COMMUNITY): Payer: Self-pay

## 2023-12-20 DIAGNOSIS — I480 Paroxysmal atrial fibrillation: Secondary | ICD-10-CM | POA: Diagnosis not present

## 2023-12-20 DIAGNOSIS — Z0000 Encounter for general adult medical examination without abnormal findings: Secondary | ICD-10-CM | POA: Diagnosis not present

## 2023-12-20 LAB — CBC WITH DIFFERENTIAL/PLATELET
Basophils Absolute: 0 K/uL (ref 0.0–0.1)
Basophils Relative: 0.5 % (ref 0.0–3.0)
Eosinophils Absolute: 0.1 K/uL (ref 0.0–0.7)
Eosinophils Relative: 2.1 % (ref 0.0–5.0)
HCT: 42.1 % (ref 36.0–46.0)
Hemoglobin: 14 g/dL (ref 12.0–15.0)
Lymphocytes Relative: 20.1 % (ref 12.0–46.0)
Lymphs Abs: 1.2 K/uL (ref 0.7–4.0)
MCHC: 33.4 g/dL (ref 30.0–36.0)
MCV: 89.2 fl (ref 78.0–100.0)
Monocytes Absolute: 0.3 K/uL (ref 0.1–1.0)
Monocytes Relative: 4.6 % (ref 3.0–12.0)
Neutro Abs: 4.4 K/uL (ref 1.4–7.7)
Neutrophils Relative %: 72.7 % (ref 43.0–77.0)
Platelets: 225 K/uL (ref 150.0–400.0)
RBC: 4.72 Mil/uL (ref 3.87–5.11)
RDW: 13.7 % (ref 11.5–15.5)
WBC: 6 K/uL (ref 4.0–10.5)

## 2023-12-20 LAB — LIPID PANEL
Cholesterol: 181 mg/dL (ref 0–200)
HDL: 85.2 mg/dL (ref >39.00)
LDL Cholesterol: 83 mg/dL (ref 0–99)
NonHDL: 95.71
Total CHOL/HDL Ratio: 2
Triglycerides: 65 mg/dL (ref 0.0–149.0)
VLDL: 13 mg/dL (ref 0.0–40.0)

## 2023-12-20 LAB — URINALYSIS, ROUTINE W REFLEX MICROSCOPIC
Bilirubin Urine: NEGATIVE
Hgb urine dipstick: NEGATIVE
Ketones, ur: NEGATIVE
Leukocytes,Ua: NEGATIVE
Nitrite: NEGATIVE
Specific Gravity, Urine: 1.02 (ref 1.000–1.030)
Total Protein, Urine: 30 — AB
Urine Glucose: NEGATIVE
Urobilinogen, UA: 0.2 (ref 0.0–1.0)
pH: 6 (ref 5.0–8.0)

## 2023-12-20 LAB — COMPREHENSIVE METABOLIC PANEL WITH GFR
ALT: 14 U/L (ref 0–35)
AST: 22 U/L (ref 0–37)
Albumin: 4.4 g/dL (ref 3.5–5.2)
Alkaline Phosphatase: 54 U/L (ref 39–117)
BUN: 17 mg/dL (ref 6–23)
CO2: 33 meq/L — ABNORMAL HIGH (ref 19–32)
Calcium: 9.4 mg/dL (ref 8.4–10.5)
Chloride: 101 meq/L (ref 96–112)
Creatinine, Ser: 0.87 mg/dL (ref 0.40–1.20)
GFR: 65.21 mL/min (ref >60.00)
Glucose, Bld: 107 mg/dL — ABNORMAL HIGH (ref 70–99)
Potassium: 4.2 meq/L (ref 3.5–5.1)
Sodium: 142 meq/L (ref 135–145)
Total Bilirubin: 0.5 mg/dL (ref 0.2–1.2)
Total Protein: 7 g/dL (ref 6.0–8.3)

## 2023-12-20 LAB — TSH: TSH: 0.87 u[IU]/mL (ref 0.35–5.50)

## 2023-12-20 NOTE — Telephone Encounter (Signed)
 Pharmacy Patient Advocate Encounter   Received notification from Onbase that prior authorization for PARoxetine  HCl 40MG  tablets  is required/requested.   Insurance verification completed.   The patient is insured through [facility] .   Per test claim: PA required; PA submitted to above mentioned insurance via Latent Key/confirmation #/EOC [id number] Status is pending

## 2023-12-21 ENCOUNTER — Encounter: Payer: Self-pay | Admitting: Internal Medicine

## 2023-12-21 ENCOUNTER — Ambulatory Visit: Admitting: Internal Medicine

## 2023-12-21 VITALS — BP 126/74 | HR 69 | Temp 97.6°F | Ht 63.0 in | Wt 146.0 lb

## 2023-12-21 DIAGNOSIS — M5416 Radiculopathy, lumbar region: Secondary | ICD-10-CM

## 2023-12-21 DIAGNOSIS — E871 Hypo-osmolality and hyponatremia: Secondary | ICD-10-CM

## 2023-12-21 DIAGNOSIS — F419 Anxiety disorder, unspecified: Secondary | ICD-10-CM

## 2023-12-21 DIAGNOSIS — J479 Bronchiectasis, uncomplicated: Secondary | ICD-10-CM

## 2023-12-21 DIAGNOSIS — I10 Essential (primary) hypertension: Secondary | ICD-10-CM | POA: Diagnosis not present

## 2023-12-21 MED ORDER — IPRATROPIUM-ALBUTEROL 0.5-2.5 (3) MG/3ML IN SOLN: 3.0000 mL | RESPIRATORY_TRACT | 3 refills | Status: AC | PRN

## 2023-12-21 MED ORDER — YUPELRI 175 MCG/3ML IN SOLN: 175.0000 ug | Freq: Every day | RESPIRATORY_TRACT | 11 refills | Status: DC

## 2023-12-21 MED ORDER — OXYCODONE-ACETAMINOPHEN 5-325 MG PO TABS: 1.0000 | ORAL_TABLET | Freq: Three times a day (TID) | ORAL | 0 refills | Status: DC | PRN

## 2023-12-21 NOTE — Assessment & Plan Note (Signed)
 DayQuil, NyQuil prn

## 2023-12-21 NOTE — Assessment & Plan Note (Signed)
 On chronic opioids Percocet prn  Potential benefits of a long term opioids use as well as potential risks (i.e. addiction risk, apnea etc) and complications (i.e. Somnolence, constipation and others) were explained to the patient and were aknowledged.

## 2023-12-21 NOTE — Assessment & Plan Note (Signed)
Monitor CMET

## 2023-12-21 NOTE — Assessment & Plan Note (Signed)
 Continue Paxil  40 mg BID

## 2023-12-21 NOTE — Patient Instructions (Signed)
 Portable Nebulizer for Adults and Kids with Smart LED Display, 3 Modes & Auto-Cleaning, Handheld Nebulizer for Home and Travel Use 4.7 out of 5 stars 85 700+ bought in past month Save 60% Price, product page$39.99 Typical: $99.99Typical: $99.99 Lowest price in 30 days FREE delivery Sun, Aug 24 Or fastest delivery Tomorrow, Aug 20 Add to cart

## 2023-12-21 NOTE — Progress Notes (Signed)
 Subjective:  Patient ID: Bianca Haley, female    DOB: [birth year]  Age: 73 y.o. MRN: [id number]  CC: Medical Management of Chronic Issues (2 month f/u)   HPI Bianca Haley presents for LBP, OAB, . Doing line dancing, chair yoga  Outpatient Medications Prior to Visit  Medication Sig Dispense Refill   acetaminophen  (TYLENOL ) 650 MG CR tablet Take 1,300 mg by mouth every 8 (eight) hours as needed for pain.     albuterol  (VENTOLIN  HFA) 108 (90 Base) MCG/ACT inhaler INHALE 2 PUFFS INTO THE LUNGS EVERY 4 HOURS AS NEEDED FOR WHEEZING OR SHORTNESS OF BREATH 18 g 5   apixaban  (ELIQUIS ) 5 MG TABS tablet Take 1 tablet (5 mg total) by mouth 2 (two) times daily. 180 tablet 3   Ascorbic Acid (VITAMIN C PO) Take 1 tablet by mouth every morning.     budesonide -formoterol  (SYMBICORT ) 160-4.5 MCG/ACT inhaler Inhale 2 puffs into the lungs 2 (two) times daily. 1 each 6   Calcium  Carb-Cholecalciferol  (CALCIUM  PLUS VITAMIN D3) 600-500 MG-UNIT CAPS Take 2 tablets by mouth every morning.     Cholecalciferol  (VITAMIN D ) 2000 units tablet Take 2,000 Units by mouth at bedtime.      cycloSPORINE  (RESTASIS ) 0.05 % ophthalmic emulsion Place 1 drop into both eyes every morning.     diazepam  (VALIUM ) 5 MG tablet TAKE 1/2 TABLET BY MOUTH TWICE DAILY. OKAY TO TAKE 1/2 TABLET IN MID-DAY IF NEEDED 30 tablet 5   diltiazem  (CARDIZEM  CD) 240 MG 24 hr capsule TAKE 1 CAPSULE BY MOUTH EVERY MORNING 90 capsule 1   fluticasone  (FLONASE ) 50 MCG/ACT nasal spray SPRAY 2 SPRAYS IN EACH NOSTRIL ONCE DAILY AS NEEDED FOR ALLERGIES OR RHINITIS 16 mL 3   hydrocortisone  2.5 % cream Apply twice daily for up to one week as needed for flares 30 g 2   ibandronate (BONIVA) 150 MG tablet Take 150 mg by mouth every 30 (thirty) days.     irbesartan  (AVAPRO ) 300 MG tablet Take 1 tablet (300 mg total) by mouth daily. 90 tablet 3   levofloxacin  (LEVAQUIN ) 500 MG tablet Take 1 tablet (500 mg total) by mouth daily. 10 tablet 0   metoprolol  tartrate  (LOPRESSOR ) 50 MG tablet TAKE 1 TABLET BY MOUTH TWICE A DAY 180 tablet 1   Multiple Vitamin (MULTIVITAMIN WITH MINERALS) TABS tablet Take 1 tablet by mouth 2 (two) times daily.     MYRBETRIQ  50 MG TB24 tablet TAKE 1 TABLET BY MOUTH EVERY MORNING 30 tablet 5   naloxone  (NARCAN ) nasal spray 4 mg/0.1 mL 1 actuation in one nostril once. May repeat in 2-3 min 1 each 2   ondansetron  (ZOFRAN -ODT) 4 MG disintegrating tablet Take 1 tablet (4 mg total) by mouth every 8 (eight) hours as needed. 30 tablet 1   OVER THE COUNTER MEDICATION Take 2 capsules by mouth in the morning. [place]     pantoprazole  (PROTONIX ) 40 MG tablet Take 1 tablet (40 mg total) by mouth 2 (two) times daily. 60180 tablet 3   PARoxetine  (PAXIL ) 40 MG tablet TAKE 1 TABLET BY MOUTH 2 TIMES A DAY 60 tablet 5   potassium chloride  (KLOR-CON ) 10 MEQ tablet TAKE 1 TABLET BY MOUTH DAILY 90 tablet 3   Probiotic CAPS Take 1 capsule by mouth every morning.     rosuvastatin  (CRESTOR ) 5 MG tablet TAKE 1 TABLET BY MOUTH DAILY 90 tablet 1   SUMAtriptan  (IMITREX ) 100 MG tablet TAKE 1 TABLET BY MOUTH ONCE. MAY REPEAT IN 2  HOURS IF HEADACHE PERSISTS OR RECURS 9 tablet 3   traZODone  (DESYREL ) 150 MG tablet Take 1 tablet (150 mg total) by mouth at bedtime as needed. for sleep 90 tablet 0   valACYclovir (VALTREX) 1000 MG tablet Take 1/2 tablet(s) bid by oral route X 3- 5days prn     vitamin B-12 (CYANOCOBALAMIN ) 1000 MCG tablet Take 1,000 mcg by mouth every morning.     oxyCODONE -acetaminophen  (PERCOCET/ROXICET) 5-325 MG tablet Take 1 tablet by mouth every 8 (eight) hours as needed for severe pain (pain score 7-10). 90 tablet 0   oxyCODONE -acetaminophen  (PERCOCET/ROXICET) 5-325 MG tablet Take 1 tablet by mouth every 8 (eight) hours as needed for severe pain (pain score 7-10). 90 tablet 0   No facility-administered medications prior to visit.    ROS: Review of Systems  Constitutional:  Negative for activity change, appetite change, chills, fatigue and  unexpected weight change.  HENT:  Negative for congestion, mouth sores and sinus pressure.   Eyes:  Negative for visual disturbance.  Respiratory:  Negative for cough and chest tightness.   Gastrointestinal:  Negative for abdominal pain and nausea.  Endocrine: Negative for cold intolerance.  Genitourinary:  Negative for difficulty urinating, frequency and vaginal pain.  Musculoskeletal:  Positive for back pain. Negative for gait problem.  Skin:  Negative for pallor and rash.  Neurological:  Negative for dizziness, tremors, weakness, numbness and headaches.  Psychiatric/Behavioral:  Negative for confusion and sleep disturbance.     Objective:  BP 126/74   Pulse 69   Temp 97.6 F (36.4 C) (Temporal)   Ht 5' 3 (1.6 m)   Wt 146 lb (66.2 kg)   SpO2 98%   BMI 25.86 kg/m   BP Readings from Last 3 Encounters:  12/21/23 126/74  11/17/23 (!) 160/68  10/20/23 126/80    Wt Readings from Last 3 Encounters:  12/21/23 146 lb (66.2 kg)  11/17/23 146 lb (66.2 kg)  10/20/23 145 lb (65.8 kg)    Physical Exam Constitutional:      General: She is not in acute distress.    Appearance: She is well-developed. She is obese.  HENT:     Head: Normocephalic.     Right Ear: External ear normal.     Left Ear: External ear normal.     Nose: Nose normal.  Eyes:     General:        Right eye: No discharge.        Left eye: No discharge.     Conjunctiva/sclera: Conjunctivae normal.     Pupils: Pupils are equal, round, and reactive to light.  Neck:     Thyroid : No thyromegaly.     Vascular: No JVD.     Trachea: No tracheal deviation.  Cardiovascular:     Rate and Rhythm: Normal rate and regular rhythm.     Heart sounds: Normal heart sounds.  Pulmonary:     Effort: No respiratory distress.     Breath sounds: No stridor. No wheezing.  Abdominal:     General: Bowel sounds are normal. There is no distension.     Palpations: Abdomen is soft. There is no mass.     Tenderness: There is no  abdominal tenderness. There is no guarding or rebound.  Musculoskeletal:        General: Tenderness present.     Cervical back: Normal range of motion and neck supple. No rigidity.     Right lower leg: No edema.     Left lower  leg: No edema.  Lymphadenopathy:     Cervical: No cervical adenopathy.  Skin:    Findings: No erythema or rash.  Neurological:     Mental Status: She is oriented to person, place, and time.     Cranial Nerves: No cranial nerve deficit.     Motor: No abnormal muscle tone.     Coordination: Coordination normal.     Gait: Gait abnormal.     Deep Tendon Reflexes: Reflexes normal.  Psychiatric:        Behavior: Behavior normal.        Thought Content: Thought content normal.        Judgment: Judgment normal.   Antalgic gait LS w/pain  Lab Results  Component Value Date   WBC 6.0 12/20/2023   HGB 14.0 12/20/2023   HCT 42.1 12/20/2023   PLT 225.0 12/20/2023   GLUCOSE 107 (H) 12/20/2023   CHOL 181 12/20/2023   TRIG 65.0 12/20/2023   HDL 85.20 12/20/2023   LDLCALC 83 12/20/2023   ALT 14 12/20/2023   AST 22 12/20/2023   NA 142 12/20/2023   K 4.2 12/20/2023   CL 101 12/20/2023   CREATININE 0.87 12/20/2023   BUN 17 12/20/2023   CO2 33 (H) 12/20/2023   TSH 0.87 12/20/2023   INR 1.0 09/21/2018   HGBA1C 6.1 06/23/2018    CT Chest Wo Contrast Result Date: 06/10/2023 CLINICAL DATA:  Followup abnormal chest CT with micro nodularity. EXAM: CT CHEST WITHOUT CONTRAST TECHNIQUE: Multidetector CT imaging of the chest was performed following the standard protocol without IV contrast. RADIATION DOSE REDUCTION: This exam was performed according to the departmental dose-optimization program which includes automated exposure control, adjustment of the mA and/or kV according to patient size and/or use of iterative reconstruction technique. COMPARISON:  Chest CT 09/07/2022 FINDINGS: Cardiovascular: The heart is normal in size. No pericardial effusion. Stable mild tortuosity  and calcification of the thoracic aorta. Mediastinum/Nodes: Stable borderline enlarged mediastinal lymph nodes. 14 mm precarinal node is unchanged. No new or progressive findings. The esophagus is unremarkable. Lungs/Pleura: Stable underlying emphysematous changes and areas of pulmonary scarring. Stable areas of irregular bronchial wall thickening, mild bronchiectasis and tree in bud type nodularity. Findings consistent with chronic inflammation or atypical infection such as MAC. No discrete worrisome pulmonary lesions or pulmonary nodules. No focal airspace consolidation, pleural effusion or pleural lesions. Upper Abdomen: No significant upper abdominal findings. Musculoskeletal: No significant bony findings. Stable scoliosis and exaggerated thoracic kyphosis. Fused midthoracic vertebral bodies again noted. IMPRESSION: 1. Stable underlying emphysematous changes and areas of pulmonary scarring. 2. Stable areas of irregular bronchial wall thickening, mild bronchiectasis and tree in bud type nodularity. Findings consistent with chronic inflammation or atypical infection such as MAC. 3. No worrisome pulmonary lesions or nodules. 4. Stable borderline enlarged mediastinal lymph nodes. This is likely reactive given the underlying lung changes. No new or progressive findings. Electronically Signed   By: MYRTIS Stammer M.D.   On: 06/10/2023 21:56    Assessment & Plan:   Problem List Items Addressed This Visit     Anxiety disorder - Primary   Continue Paxil  40 mg BID          Bronchiectasis without complication (HCC)   DayQuil, NyQuil prn      Essential hypertension   Stable. Cont w/Atenolol , Diltiazem ,  Maxzide      Hyponatremia   Monitor CMET      Lumbar radiculopathy   On chronic opioids Percocet prn  Potential benefits of  a long term opioids use as well as potential risks (i.e. addiction risk, apnea etc) and complications (i.e. Somnolence, constipation and others) were explained to the patient  and were aknowledged.          Meds ordered this encounter  Medications   oxyCODONE -acetaminophen  (PERCOCET/ROXICET) 5-325 MG tablet    Sig: Take 1 tablet by mouth every 8 (eight) hours as needed for severe pain (pain score 7-10).    Dispense:  90 tablet    Refill:  0    Please fill on or after 01/04/24 Code: M54.16   oxyCODONE -acetaminophen  (PERCOCET/ROXICET) 5-325 MG tablet    Sig: Take 1 tablet by mouth every 8 (eight) hours as needed for severe pain (pain score 7-10).    Dispense:  90 tablet    Refill:  0    Please fill on or after 02/03/24 Code: M54.16   oxyCODONE -acetaminophen  (PERCOCET/ROXICET) 5-325 MG tablet    Sig: Take 1 tablet by mouth every 8 (eight) hours as needed for severe pain (pain score 7-10).    Dispense:  90 tablet    Refill:  0    Please fill on or after 03/04/2024      Follow-up: Return in about 3 months (around 03/22/2024) for a follow-up visit.  Marolyn Noel, MD

## 2023-12-21 NOTE — Assessment & Plan Note (Signed)
Stable. Cont w/Atenolol, Diltiazem,  Maxzide 

## 2023-12-22 ENCOUNTER — Other Ambulatory Visit (HOSPITAL_COMMUNITY): Payer: Self-pay

## 2023-12-22 DIAGNOSIS — K080 Exfoliation of teeth due to systemic causes: Secondary | ICD-10-CM | POA: Diagnosis not present

## 2023-12-22 NOTE — Telephone Encounter (Signed)
 Pharmacy Patient Advocate Encounter  Received notification from [facility] that Prior Authorization for PARoxetine  HCl 40MG  tablets  has been APPROVED from 12/20/23 to 12/19/24. Unable to obtain price due to refill too soon rejection, last fill date 12/21/23 next available fill date09/11/25   PA #/Case ID/Reference #: [id number]

## 2023-12-30 ENCOUNTER — Other Ambulatory Visit: Payer: Self-pay | Admitting: Internal Medicine

## 2024-01-10 DIAGNOSIS — L720 Epidermal cyst: Secondary | ICD-10-CM | POA: Diagnosis not present

## 2024-01-10 DIAGNOSIS — Z85828 Personal history of other malignant neoplasm of skin: Secondary | ICD-10-CM | POA: Diagnosis not present

## 2024-01-10 DIAGNOSIS — L570 Actinic keratosis: Secondary | ICD-10-CM | POA: Diagnosis not present

## 2024-01-10 DIAGNOSIS — D225 Melanocytic nevi of trunk: Secondary | ICD-10-CM | POA: Diagnosis not present

## 2024-01-10 DIAGNOSIS — L853 Xerosis cutis: Secondary | ICD-10-CM | POA: Diagnosis not present

## 2024-01-11 DIAGNOSIS — M542 Cervicalgia: Secondary | ICD-10-CM | POA: Diagnosis not present

## 2024-01-19 ENCOUNTER — Other Ambulatory Visit: Payer: Self-pay | Admitting: Internal Medicine

## 2024-01-20 NOTE — Telephone Encounter (Signed)
 Medication: Diazepam  Directions:TAKE 1/2 TABLET BY MOUTH TWICE DAILY. OKAY TO TAKE 1/2 TABLET IN MID-DAY IF NEEDED  Last given: 07/13/2023 Number refills: 5 Last o/v: 12/21/2023 Follow up:  Labs: 12/20/2023

## 2024-02-18 DIAGNOSIS — J189 Pneumonia, unspecified organism: Secondary | ICD-10-CM | POA: Diagnosis not present

## 2024-02-22 ENCOUNTER — Telehealth: Payer: Self-pay | Admitting: Internal Medicine

## 2024-02-22 NOTE — Telephone Encounter (Unsigned)
 Copied from CRM #[id number]. Topic: Clinical - Medication Refill >> Feb 22, 2024 10:22 AM Alfonso HERO wrote: Medication: metoprolol  tartrate (LOPRESSOR ) 50 MG tablet  Has the patient contacted their pharmacy? Yes (Agent: If no, request that the patient contact the pharmacy for the refill. If patient does not wish to contact the pharmacy document the reason why and proceed with request.) (Agent: If yes, when and what did the pharmacy advise?)  This is the patient's preferred pharmacy:  [id number] [place], [place] - [address] [address] Phone: [phone] Fax: [phone]  Is this the correct pharmacy for this prescription? Yes If no, delete pharmacy and type the correct one.   Has the prescription been filled recently? Yes  Is the patient out of the medication? Yes  Has the patient been seen for an appointment in the last year OR does the patient have an upcoming appointment? Yes  Can we respond through MyChart? Yes  Agent: Please be advised that Rx refills may take up to 3 business days. We ask that you follow-up with your pharmacy.

## 2024-02-24 ENCOUNTER — Other Ambulatory Visit: Payer: Self-pay | Admitting: Internal Medicine

## 2024-02-28 ENCOUNTER — Other Ambulatory Visit: Payer: Self-pay | Admitting: Internal Medicine

## 2024-02-28 NOTE — Telephone Encounter (Signed)
 Copied from CRM [id number]. Topic: Clinical - Medication Refill >> Feb 28, 2024 12:47 PM Aisha D wrote: Medication: oxyCODONE -acetaminophen , traZODone  (DESYREL ) 150 MG tablet ,   Has the patient contacted their pharmacy? No (Agent: If no, request that the patient contact the pharmacy for the refill. If patient does not wish to contact the pharmacy document the reason why and proceed with request.) (Agent: If yes, when and what did the pharmacy advise?)  This is the patient's preferred pharmacy:  [facility] [id number] [place], [place] - [address] [place] [address] Phone: [phone] Fax: [phone]  Is this the correct pharmacy for this prescription? Yes If no, delete pharmacy and type the correct one.   Has the prescription been filled recently? No  Is the patient out of the medication? No  Has the patient been seen for an appointment in the last year OR does the patient have an upcoming appointment? Yes  Can we respond through MyChart? Yes  Agent: Please be advised that Rx refills may take up to 3 business days. We ask that you follow-up with your pharmacy.

## 2024-03-06 ENCOUNTER — Telehealth: Payer: Self-pay

## 2024-03-06 ENCOUNTER — Other Ambulatory Visit (HOSPITAL_BASED_OUTPATIENT_CLINIC_OR_DEPARTMENT_OTHER): Payer: Self-pay | Admitting: Pulmonary Disease

## 2024-03-06 ENCOUNTER — Other Ambulatory Visit: Payer: Self-pay | Admitting: Internal Medicine

## 2024-03-06 MED ORDER — TRAZODONE HCL 150 MG PO TABS: 150.0000 mg | ORAL_TABLET | Freq: Every evening | ORAL | 2 refills | Status: DC | PRN

## 2024-03-06 NOTE — Telephone Encounter (Signed)
 Copied from CRM [id number]. Topic: Clinical - Prescription Issue >> Mar 06, 2024  9:52 AM Jasmin G wrote: Reason for CRM: Pt called to check on the status of traZODone  HCl 150 mg refill, I could not find info to relay, pt requested for prescription refill to be placed ASAP or to be given a call at [phone] to receive a status as she states that she is almost out of her med.

## 2024-03-06 NOTE — Telephone Encounter (Signed)
 Done. Thx.

## 2024-03-07 MED ORDER — OXYCODONE-ACETAMINOPHEN 5-325 MG PO TABS: 1.0000 | ORAL_TABLET | Freq: Three times a day (TID) | ORAL | 0 refills | Status: DC | PRN

## 2024-03-15 ENCOUNTER — Other Ambulatory Visit: Payer: Self-pay | Admitting: Internal Medicine

## 2024-03-18 ENCOUNTER — Other Ambulatory Visit: Payer: Self-pay | Admitting: Internal Medicine

## 2024-03-22 ENCOUNTER — Ambulatory Visit (INDEPENDENT_AMBULATORY_CARE_PROVIDER_SITE_OTHER): Admitting: Internal Medicine

## 2024-03-22 ENCOUNTER — Encounter: Payer: Self-pay | Admitting: Internal Medicine

## 2024-03-22 VITALS — BP 132/84 | HR 60 | Temp 97.0°F | Ht 63.0 in | Wt 150.8 lb

## 2024-03-22 DIAGNOSIS — I10 Essential (primary) hypertension: Secondary | ICD-10-CM

## 2024-03-22 DIAGNOSIS — F419 Anxiety disorder, unspecified: Secondary | ICD-10-CM

## 2024-03-22 DIAGNOSIS — N2889 Other specified disorders of kidney and ureter: Secondary | ICD-10-CM

## 2024-03-22 DIAGNOSIS — G43909 Migraine, unspecified, not intractable, without status migrainosus: Secondary | ICD-10-CM

## 2024-03-22 DIAGNOSIS — J189 Pneumonia, unspecified organism: Secondary | ICD-10-CM | POA: Diagnosis not present

## 2024-03-22 DIAGNOSIS — G8929 Other chronic pain: Secondary | ICD-10-CM

## 2024-03-22 DIAGNOSIS — E785 Hyperlipidemia, unspecified: Secondary | ICD-10-CM

## 2024-03-22 DIAGNOSIS — M5442 Lumbago with sciatica, left side: Secondary | ICD-10-CM | POA: Diagnosis not present

## 2024-03-22 DIAGNOSIS — K219 Gastro-esophageal reflux disease without esophagitis: Secondary | ICD-10-CM

## 2024-03-22 DIAGNOSIS — M5441 Lumbago with sciatica, right side: Secondary | ICD-10-CM

## 2024-03-22 MED ORDER — IBANDRONATE SODIUM 150 MG PO TABS: 150.0000 mg | ORAL_TABLET | ORAL | 3 refills | Status: AC

## 2024-03-22 MED ORDER — MIRABEGRON ER 50 MG PO TB24: 50.0000 mg | ORAL_TABLET | Freq: Every morning | ORAL | 3 refills | Status: AC

## 2024-03-22 MED ORDER — SUMATRIPTAN SUCCINATE 100 MG PO TABS: ORAL_TABLET | ORAL | 3 refills | Status: AC

## 2024-03-22 MED ORDER — BUDESONIDE-FORMOTEROL FUMARATE 160-4.5 MCG/ACT IN AERO: 2.0000 | INHALATION_SPRAY | Freq: Two times a day (BID) | RESPIRATORY_TRACT | 3 refills | Status: AC

## 2024-03-22 MED ORDER — VALACYCLOVIR HCL 500 MG PO TABS: 500.0000 mg | ORAL_TABLET | Freq: Three times a day (TID) | ORAL | 3 refills | Status: AC

## 2024-03-22 MED ORDER — TRAZODONE HCL 150 MG PO TABS: 150.0000 mg | ORAL_TABLET | Freq: Every evening | ORAL | 3 refills | Status: AC | PRN

## 2024-03-22 MED ORDER — IRBESARTAN 300 MG PO TABS: 300.0000 mg | ORAL_TABLET | Freq: Every day | ORAL | 3 refills | Status: AC

## 2024-03-22 MED ORDER — HYDROCORTISONE 2.5 % EX CREA: TOPICAL_CREAM | CUTANEOUS | 2 refills | Status: AC

## 2024-03-22 MED ORDER — METOPROLOL TARTRATE 50 MG PO TABS: 50.0000 mg | ORAL_TABLET | Freq: Two times a day (BID) | ORAL | 3 refills | Status: AC

## 2024-03-22 MED ORDER — APIXABAN 5 MG PO TABS: 5.0000 mg | ORAL_TABLET | Freq: Two times a day (BID) | ORAL | 3 refills | Status: AC

## 2024-03-22 MED ORDER — PANTOPRAZOLE SODIUM 40 MG PO TBEC: 40.0000 mg | DELAYED_RELEASE_TABLET | Freq: Two times a day (BID) | ORAL | 3 refills | Status: AC

## 2024-03-22 MED ORDER — YUPELRI 175 MCG/3ML IN SOLN: 175.0000 ug | Freq: Every day | RESPIRATORY_TRACT | 3 refills | Status: AC

## 2024-03-22 MED ORDER — FLUTICASONE PROPIONATE 50 MCG/ACT NA SUSP: 2.0000 | Freq: Every day | NASAL | 3 refills | Status: AC

## 2024-03-22 MED ORDER — NALOXONE HCL 4 MG/0.1ML NA LIQD: NASAL | 2 refills | Status: AC

## 2024-03-22 MED ORDER — OXYCODONE-ACETAMINOPHEN 5-325 MG PO TABS: 1.0000 | ORAL_TABLET | Freq: Three times a day (TID) | ORAL | 0 refills | Status: AC | PRN

## 2024-03-22 MED ORDER — POTASSIUM CHLORIDE ER 10 MEQ PO TBCR
10.0000 meq | EXTENDED_RELEASE_TABLET | Freq: Every day | ORAL | 3 refills | Status: AC
Start: 2024-03-22 — End: ?

## 2024-03-22 MED ORDER — DILTIAZEM HCL ER COATED BEADS 240 MG PO CP24: 240.0000 mg | ORAL_CAPSULE | Freq: Every morning | ORAL | 3 refills | Status: AC

## 2024-03-22 MED ORDER — ROSUVASTATIN CALCIUM 5 MG PO TABS: 5.0000 mg | ORAL_TABLET | Freq: Every day | ORAL | 1 refills | Status: AC

## 2024-03-22 MED ORDER — ALBUTEROL SULFATE HFA 108 (90 BASE) MCG/ACT IN AERS: INHALATION_SPRAY | RESPIRATORY_TRACT | 5 refills | Status: AC

## 2024-03-22 MED ORDER — PAROXETINE HCL 40 MG PO TABS: 40.0000 mg | ORAL_TABLET | Freq: Two times a day (BID) | ORAL | 1 refills | Status: AC

## 2024-03-22 MED ORDER — DIAZEPAM 5 MG PO TABS: ORAL_TABLET | ORAL | 3 refills | Status: AC

## 2024-03-22 MED ORDER — ONDANSETRON 4 MG PO TBDP: 4.0000 mg | ORAL_TABLET | Freq: Three times a day (TID) | ORAL | 1 refills | Status: AC | PRN

## 2024-03-22 NOTE — Assessment & Plan Note (Signed)
On  Imitrex prn

## 2024-03-22 NOTE — Patient Instructions (Signed)
HOKA sneakers

## 2024-03-22 NOTE — Assessment & Plan Note (Signed)
 On Crestor 

## 2024-03-22 NOTE — Progress Notes (Signed)
 Subjective:  Patient ID: Bianca Haley, female    DOB: July 21, 1950  Age: 73 y.o. MRN: [id number]  CC: Medical Management of Chronic Issues (3 Month follow up. Medication reconciliation due to new med cost starting next year)   HPI NYRIE SIGAL presents for chronic pain, depression, anxiety, B12 def  Outpatient Medications Prior to Visit  Medication Sig Dispense Refill   acetaminophen  (TYLENOL ) 650 MG CR tablet Take 1,300 mg by mouth every 8 (eight) hours as needed for pain.     Ascorbic Acid (VITAMIN C PO) Take 1 tablet by mouth every morning.     Calcium  Carb-Cholecalciferol  (CALCIUM  PLUS VITAMIN D3) 600-500 MG-UNIT CAPS Take 2 tablets by mouth every morning.     Cholecalciferol  (VITAMIN D ) 2000 units tablet Take 2,000 Units by mouth at bedtime.      cycloSPORINE  (RESTASIS ) 0.05 % ophthalmic emulsion Place 1 drop into both eyes every morning.     ipratropium-albuterol  (DUONEB) 0.5-2.5 (3) MG/3ML SOLN Take 3 mLs by nebulization every 4 (four) hours as needed. 360 mL 3   levofloxacin  (LEVAQUIN ) 500 MG tablet Take 1 tablet (500 mg total) by mouth daily. 10 tablet 0   Multiple Vitamin (MULTIVITAMIN WITH MINERALS) TABS tablet Take 1 tablet by mouth 2 (two) times daily.     OVER THE COUNTER MEDICATION Take 2 capsules by mouth in the morning. [place]     Probiotic CAPS Take 1 capsule by mouth every morning.     vitamin B-12 (CYANOCOBALAMIN ) 1000 MCG tablet Take 1,000 mcg by mouth every morning.     albuterol  (VENTOLIN  HFA) 108 (90 Base) MCG/ACT inhaler INHALE 2 PUFFS INTO THE LUNGS EVERY 4 HOURS AS NEEDED FOR WHEEZING OR SHORTNESS OF BREATH 18 g 5   apixaban  (ELIQUIS ) 5 MG TABS tablet Take 1 tablet (5 mg total) by mouth 2 (two) times daily. 180 tablet 3   budesonide -formoterol  (SYMBICORT ) 160-4.5 MCG/ACT inhaler Inhale 2 puffs into the lungs 2 (two) times daily. 1 each 6   diazepam  (VALIUM ) 5 MG tablet TAKE 1/2 TABLET BY MOUTH 2 TIMES A DAY OK TO TAKE 1/2 TABLET IN MID-DAY IF NEEDED 30  tablet 3   diltiazem  (CARDIZEM  CD) 240 MG 24 hr capsule TAKE 1 CAPSULE BY MOUTH EVERY MORNING 90 capsule 1   fluticasone  (FLONASE ) 50 MCG/ACT nasal spray SPRAY 2 SPRAYS IN EACH NOSTRIL ONCE DAILY AS NEEDED FOR ALLERGIES OR RHINITIS 16 mL 3   hydrocortisone  2.5 % cream Apply twice daily for up to one week as needed for flares 30 g 2   ibandronate  (BONIVA ) 150 MG tablet Take 150 mg by mouth every 30 (thirty) days.     irbesartan  (AVAPRO ) 300 MG tablet Take 1 tablet (300 mg total) by mouth daily. 90 tablet 3   metoprolol  tartrate (LOPRESSOR ) 50 MG tablet TAKE 1 TABLET BY MOUTH TWICE A DAY 180 tablet 1   MYRBETRIQ  50 MG TB24 tablet TAKE 1 TABLET BY MOUTH EVERY MORNING 30 tablet 5   naloxone  (NARCAN ) nasal spray 4 mg/0.1 mL 1 actuation in one nostril once. May repeat in 2-3 min 1 each 2   ondansetron  (ZOFRAN -ODT) 4 MG disintegrating tablet Take 1 tablet (4 mg total) by mouth every 8 (eight) hours as needed. 30 tablet 1   oxyCODONE -acetaminophen  (PERCOCET/ROXICET) 5-325 MG tablet Take 1 tablet by mouth every 8 (eight) hours as needed for severe pain (pain score 7-10). 90 tablet 0   oxyCODONE -acetaminophen  (PERCOCET/ROXICET) 5-325 MG tablet Take 1 tablet by mouth every 8 (  eight) hours as needed for severe pain (pain score 7-10). 90 tablet 0   oxyCODONE -acetaminophen  (PERCOCET/ROXICET) 5-325 MG tablet Take 1 tablet by mouth every 8 (eight) hours as needed for severe pain (pain score 7-10). 90 tablet 0   pantoprazole  (PROTONIX ) 40 MG tablet Take 1 tablet (40 mg total) by mouth 2 (two) times daily. 60180 tablet 3   PARoxetine  (PAXIL ) 40 MG tablet TAKE 1 TABLET BY MOUTH 2 TIMES A DAY 60 tablet 5   potassium chloride  (KLOR-CON ) 10 MEQ tablet TAKE 1 TABLET BY MOUTH DAILY 90 tablet 3   revefenacin  (YUPELRI ) 175 MCG/3ML nebulizer solution Take 3 mLs (175 mcg total) by nebulization daily. 30 mL 11   rosuvastatin  (CRESTOR ) 5 MG tablet TAKE 1 TABLET BY MOUTH DAILY 90 tablet 1   SUMAtriptan  (IMITREX ) 100 MG tablet  TAKE 1 TABLET BY MOUTH ONCE. MAY REPEAT IN 2 HOURS IF HEADACHE PERSISTS OR RECURS 9 tablet 3   traZODone  (DESYREL ) 150 MG tablet Take 1 tablet (150 mg total) by mouth at bedtime as needed. for sleep 90 tablet 2   valACYclovir (VALTREX) 1000 MG tablet Take 1/2 tablet(s) bid by oral route X 3- 5days prn     No facility-administered medications prior to visit.    ROS: Review of Systems  Constitutional:  Positive for fatigue. Negative for activity change, appetite change, chills and unexpected weight change.  HENT:  Negative for congestion, mouth sores and sinus pressure.   Eyes:  Negative for visual disturbance.  Respiratory:  Negative for cough and chest tightness.   Gastrointestinal:  Negative for abdominal pain and nausea.  Genitourinary:  Negative for difficulty urinating, frequency and vaginal pain.  Musculoskeletal:  Positive for back pain. Negative for gait problem.  Skin:  Negative for pallor and rash.  Neurological:  Negative for dizziness, tremors, weakness, numbness and headaches.  Psychiatric/Behavioral:  Negative for confusion and sleep disturbance.     Objective:  BP 132/84   Pulse 60   Temp (!) 97 F (36.1 C)   Ht 5' 3 (1.6 m)   Wt 150 lb 12.8 oz (68.4 kg)   SpO2 97%   BMI 26.71 kg/m   BP Readings from Last 3 Encounters:  03/22/24 132/84  12/21/23 126/74  11/17/23 (!) 160/68    Wt Readings from Last 3 Encounters:  03/22/24 150 lb 12.8 oz (68.4 kg)  12/21/23 146 lb (66.2 kg)  11/17/23 146 lb (66.2 kg)    Physical Exam Constitutional:      General: She is not in acute distress.    Appearance: She is well-developed.  HENT:     Head: Normocephalic.     Right Ear: External ear normal.     Left Ear: External ear normal.     Nose: Nose normal.  Eyes:     General:        Right eye: No discharge.        Left eye: No discharge.     Conjunctiva/sclera: Conjunctivae normal.     Pupils: Pupils are equal, round, and reactive to light.  Neck:     Thyroid : No  thyromegaly.     Vascular: No JVD.     Trachea: No tracheal deviation.  Cardiovascular:     Rate and Rhythm: Normal rate and regular rhythm.     Heart sounds: Normal heart sounds.  Pulmonary:     Effort: No respiratory distress.     Breath sounds: No stridor. No wheezing.  Abdominal:     General: Bowel  sounds are normal. There is no distension.     Palpations: Abdomen is soft. There is no mass.     Tenderness: There is no abdominal tenderness. There is no guarding or rebound.  Musculoskeletal:        General: Tenderness present.     Cervical back: Normal range of motion and neck supple. No rigidity.     Right lower leg: No edema.     Left lower leg: No edema.  Lymphadenopathy:     Cervical: No cervical adenopathy.  Skin:    Findings: No erythema or rash.  Neurological:     Cranial Nerves: No cranial nerve deficit.     Motor: No abnormal muscle tone.     Coordination: Coordination normal.     Deep Tendon Reflexes: Reflexes normal.  Psychiatric:        Behavior: Behavior normal.        Thought Content: Thought content normal.        Judgment: Judgment normal.    LS w/pain  Lab Results  Component Value Date   WBC 6.0 12/20/2023   HGB 14.0 12/20/2023   HCT 42.1 12/20/2023   PLT 225.0 12/20/2023   GLUCOSE 107 (H) 12/20/2023   CHOL 181 12/20/2023   TRIG 65.0 12/20/2023   HDL 85.20 12/20/2023   LDLCALC 83 12/20/2023   ALT 14 12/20/2023   AST 22 12/20/2023   NA 142 12/20/2023   K 4.2 12/20/2023   CL 101 12/20/2023   CREATININE 0.87 12/20/2023   BUN 17 12/20/2023   CO2 33 (H) 12/20/2023   TSH 0.87 12/20/2023   INR 1.0 09/21/2018   HGBA1C 6.1 06/23/2018    CT Chest Wo Contrast Result Date: 06/10/2023 CLINICAL DATA:  Followup abnormal chest CT with micro nodularity. EXAM: CT CHEST WITHOUT CONTRAST TECHNIQUE: Multidetector CT imaging of the chest was performed following the standard protocol without IV contrast. RADIATION DOSE REDUCTION: This exam was performed  according to the departmental dose-optimization program which includes automated exposure control, adjustment of the mA and/or kV according to patient size and/or use of iterative reconstruction technique. COMPARISON:  Chest CT 09/07/2022 FINDINGS: Cardiovascular: The heart is normal in size. No pericardial effusion. Stable mild tortuosity and calcification of the thoracic aorta. Mediastinum/Nodes: Stable borderline enlarged mediastinal lymph nodes. 14 mm precarinal node is unchanged. No new or progressive findings. The esophagus is unremarkable. Lungs/Pleura: Stable underlying emphysematous changes and areas of pulmonary scarring. Stable areas of irregular bronchial wall thickening, mild bronchiectasis and tree in bud type nodularity. Findings consistent with chronic inflammation or atypical infection such as MAC. No discrete worrisome pulmonary lesions or pulmonary nodules. No focal airspace consolidation, pleural effusion or pleural lesions. Upper Abdomen: No significant upper abdominal findings. Musculoskeletal: No significant bony findings. Stable scoliosis and exaggerated thoracic kyphosis. Fused midthoracic vertebral bodies again noted. IMPRESSION: 1. Stable underlying emphysematous changes and areas of pulmonary scarring. 2. Stable areas of irregular bronchial wall thickening, mild bronchiectasis and tree in bud type nodularity. Findings consistent with chronic inflammation or atypical infection such as MAC. 3. No worrisome pulmonary lesions or nodules. 4. Stable borderline enlarged mediastinal lymph nodes. This is likely reactive given the underlying lung changes. No new or progressive findings. Electronically Signed   By: MYRTIS Stammer M.D.   On: 06/10/2023 21:56    Assessment & Plan:   Problem List Items Addressed This Visit     Anxiety disorder - Primary   Continue Paxil  40 mg BID  Relevant Medications   diazepam  (VALIUM ) 5 MG tablet   PARoxetine  (PAXIL ) 40 MG tablet   traZODone   (DESYREL ) 150 MG tablet   Chronic low back pain   Chronic Percocet prn  Potential benefits of a long term opioids use as well as potential risks (i.e. addiction risk, apnea etc) and complications (i.e. Somnolence, constipation and others) were explained to the patient and were aknowledged. A compound cream Rx      Relevant Medications   oxyCODONE -acetaminophen  (PERCOCET/ROXICET) 5-325 MG tablet   oxyCODONE -acetaminophen  (PERCOCET/ROXICET) 5-325 MG tablet   oxyCODONE -acetaminophen  (PERCOCET/ROXICET) 5-325 MG tablet   PARoxetine  (PAXIL ) 40 MG tablet   traZODone  (DESYREL ) 150 MG tablet   CRI (chronic renal insufficiency), stage 3 (moderate)   Cont w/good hydration Monitor GFR      Dyslipidemia   On Crestor       Relevant Medications   rosuvastatin  (CRESTOR ) 5 MG tablet   Essential hypertension   Stable. Cont w/Atenolol , Diltiazem ,  Maxzide      Relevant Medications   apixaban  (ELIQUIS ) 5 MG TABS tablet   diltiazem  (CARDIZEM  CD) 240 MG 24 hr capsule   irbesartan  (AVAPRO ) 300 MG tablet   metoprolol  tartrate (LOPRESSOR ) 50 MG tablet   rosuvastatin  (CRESTOR ) 5 MG tablet   GERD without esophagitis   Stable. Cont w/bed head elevation and continue Protonix  bid      Relevant Medications   ondansetron  (ZOFRAN -ODT) 4 MG disintegrating tablet   pantoprazole  (PROTONIX ) 40 MG tablet   Migraine headache   On Imitrex  prn      Relevant Medications   oxyCODONE -acetaminophen  (PERCOCET/ROXICET) 5-325 MG tablet   oxyCODONE -acetaminophen  (PERCOCET/ROXICET) 5-325 MG tablet   oxyCODONE -acetaminophen  (PERCOCET/ROXICET) 5-325 MG tablet   apixaban  (ELIQUIS ) 5 MG TABS tablet   diltiazem  (CARDIZEM  CD) 240 MG 24 hr capsule   irbesartan  (AVAPRO ) 300 MG tablet   metoprolol  tartrate (LOPRESSOR ) 50 MG tablet   PARoxetine  (PAXIL ) 40 MG tablet   rosuvastatin  (CRESTOR ) 5 MG tablet   SUMAtriptan  (IMITREX ) 100 MG tablet   traZODone  (DESYREL ) 150 MG tablet   Other Visit Diagnoses       Community  acquired pneumonia, unspecified laterality       Relevant Medications   albuterol  (VENTOLIN  HFA) 108 (90 Base) MCG/ACT inhaler   budesonide -formoterol  (SYMBICORT ) 160-4.5 MCG/ACT inhaler   fluticasone  (FLONASE ) 50 MCG/ACT nasal spray   revefenacin  (YUPELRI ) 175 MCG/3ML nebulizer solution   valACYclovir (VALTREX) 500 MG tablet         Meds ordered this encounter  Medications   oxyCODONE -acetaminophen  (PERCOCET/ROXICET) 5-325 MG tablet    Sig: Take 1 tablet by mouth every 8 (eight) hours as needed for severe pain (pain score 7-10).    Dispense:  90 tablet    Refill:  0    Please fill on or after 03/29/24 Code: Code: M54.50, G89.29   oxyCODONE -acetaminophen  (PERCOCET/ROXICET) 5-325 MG tablet    Sig: Take 1 tablet by mouth every 8 (eight) hours as needed for severe pain (pain score 7-10).    Dispense:  90 tablet    Refill:  0    Please fill on or after 04/28/24 Code: Code: M54.50, G89.29   oxyCODONE -acetaminophen  (PERCOCET/ROXICET) 5-325 MG tablet    Sig: Take 1 tablet by mouth every 8 (eight) hours as needed for severe pain (pain score 7-10).    Dispense:  90 tablet    Refill:  0    Please fill on or after 05/28/24 Code: Code: M54.50, G89.29   diazepam  (VALIUM ) 5 MG tablet  Sig: TAKE 1/2 TABLET BY MOUTH 2 TIMES A DAY OK TO TAKE 1/2 TABLET IN MID-DAY IF NEEDED    Dispense:  30 tablet    Refill:  3   albuterol  (VENTOLIN  HFA) 108 (90 Base) MCG/ACT inhaler    Sig: INHALE 2 PUFFS INTO THE LUNGS EVERY 4 HOURS AS NEEDED FOR WHEEZING OR SHORTNESS OF BREATH    Dispense:  18 g    Refill:  5   apixaban  (ELIQUIS ) 5 MG TABS tablet    Sig: Take 1 tablet (5 mg total) by mouth 2 (two) times daily.    Dispense:  180 tablet    Refill:  3   budesonide -formoterol  (SYMBICORT ) 160-4.5 MCG/ACT inhaler    Sig: Inhale 2 puffs into the lungs 2 (two) times daily.    Dispense:  3 each    Refill:  3   diltiazem  (CARDIZEM  CD) 240 MG 24 hr capsule    Sig: Take 1 capsule (240 mg total) by mouth every  morning.    Dispense:  90 capsule    Refill:  3   fluticasone  (FLONASE ) 50 MCG/ACT nasal spray    Sig: Place 2 sprays into both nostrils daily.    Dispense:  48 mL    Refill:  3   hydrocortisone  2.5 % cream    Sig: Apply twice daily for up to one week as needed for flares    Dispense:  90 g    Refill:  2   ibandronate  (BONIVA ) 150 MG tablet    Sig: Take 1 tablet (150 mg total) by mouth every 30 (thirty) days.    Dispense:  3 tablet    Refill:  3   irbesartan  (AVAPRO ) 300 MG tablet    Sig: Take 1 tablet (300 mg total) by mouth daily.    Dispense:  90 tablet    Refill:  3   metoprolol  tartrate (LOPRESSOR ) 50 MG tablet    Sig: Take 1 tablet (50 mg total) by mouth 2 (two) times daily.    Dispense:  180 tablet    Refill:  3   mirabegron  ER (MYRBETRIQ ) 50 MG TB24 tablet    Sig: Take 1 tablet (50 mg total) by mouth every morning.    Dispense:  90 tablet    Refill:  3   naloxone  (NARCAN ) nasal spray 4 mg/0.1 mL    Sig: 1 actuation in one nostril once. May repeat in 2-3 min    Dispense:  1 each    Refill:  2   ondansetron  (ZOFRAN -ODT) 4 MG disintegrating tablet    Sig: Take 1 tablet (4 mg total) by mouth every 8 (eight) hours as needed.    Dispense:  30 tablet    Refill:  1   pantoprazole  (PROTONIX ) 40 MG tablet    Sig: Take 1 tablet (40 mg total) by mouth 2 (two) times daily.    Dispense:  180 tablet    Refill:  3   PARoxetine  (PAXIL ) 40 MG tablet    Sig: Take 1 tablet (40 mg total) by mouth 2 (two) times daily.    Dispense:  180 tablet    Refill:  1   potassium chloride  (KLOR-CON ) 10 MEQ tablet    Sig: Take 1 tablet (10 mEq total) by mouth daily.    Dispense:  90 tablet    Refill:  3   revefenacin  (YUPELRI ) 175 MCG/3ML nebulizer solution    Sig: Take 3 mLs (175 mcg total) by nebulization daily.  Dispense:  90 mL    Refill:  3   rosuvastatin  (CRESTOR ) 5 MG tablet    Sig: Take 1 tablet (5 mg total) by mouth daily.    Dispense:  90 tablet    Refill:  1   SUMAtriptan   (IMITREX ) 100 MG tablet    Sig: TAKE 1 TABLET BY MOUTH ONCE. MAY REPEAT IN 2 HOURS IF HEADACHE PERSISTS OR RECURS    Dispense:  30 tablet    Refill:  3   traZODone  (DESYREL ) 150 MG tablet    Sig: Take 1 tablet (150 mg total) by mouth at bedtime as needed. for sleep    Dispense:  90 tablet    Refill:  3   valACYclovir  (VALTREX ) 500 MG tablet    Sig: Take 1 tablet (500 mg total) by mouth 3 (three) times daily.    Dispense:  21 tablet    Refill:  3      Follow-up: Return in about 3 months (around 06/22/2024) for a follow-up visit.  Marolyn Noel, MD

## 2024-03-22 NOTE — Assessment & Plan Note (Signed)
 Cont w/good hydration Monitor GFR

## 2024-03-22 NOTE — Assessment & Plan Note (Signed)
Stable. Cont w/bed head elevation and continue Protonix bid

## 2024-03-22 NOTE — Assessment & Plan Note (Signed)
 Continue Paxil  40 mg BID

## 2024-03-22 NOTE — Assessment & Plan Note (Signed)
Chronic Percocet prn  Potential benefits of a long term opioids use as well as potential risks (i.e. addiction risk, apnea etc) and complications (i.e. Somnolence, constipation and others) were explained to the patient and were aknowledged. A compound cream Rx

## 2024-03-22 NOTE — Assessment & Plan Note (Signed)
Stable. Cont w/Atenolol, Diltiazem,  Maxzide 

## 2024-03-27 ENCOUNTER — Encounter: Payer: Self-pay | Admitting: Pharmacist

## 2024-05-01 ENCOUNTER — Ambulatory Visit: Admitting: Family Medicine

## 2024-05-05 ENCOUNTER — Ambulatory Visit: Payer: Self-pay | Admitting: Family Medicine

## 2024-05-05 ENCOUNTER — Ambulatory Visit: Admitting: Family Medicine

## 2024-05-05 ENCOUNTER — Encounter: Payer: Self-pay | Admitting: Family Medicine

## 2024-05-05 VITALS — BP 150/86 | HR 67 | Temp 98.1°F | Ht 63.0 in | Wt 148.2 lb

## 2024-05-05 DIAGNOSIS — J189 Pneumonia, unspecified organism: Secondary | ICD-10-CM

## 2024-05-05 LAB — COMPREHENSIVE METABOLIC PANEL WITH GFR
ALT: 14 U/L (ref 3–35)
AST: 21 U/L (ref 5–37)
Albumin: 4.4 g/dL (ref 3.5–5.2)
Alkaline Phosphatase: 64 U/L (ref 39–117)
BUN: 14 mg/dL (ref 6–23)
CO2: 35 meq/L — ABNORMAL HIGH (ref 19–32)
Calcium: 9.8 mg/dL (ref 8.4–10.5)
Chloride: 99 meq/L (ref 96–112)
Creatinine, Ser: 0.66 mg/dL (ref 0.40–1.20)
GFR: 85.63 mL/min
Glucose, Bld: 87 mg/dL (ref 70–99)
Potassium: 3.4 meq/L — ABNORMAL LOW (ref 3.5–5.1)
Sodium: 142 meq/L (ref 135–145)
Total Bilirubin: 0.4 mg/dL (ref 0.2–1.2)
Total Protein: 7.7 g/dL (ref 6.0–8.3)

## 2024-05-05 LAB — CBC WITH DIFFERENTIAL/PLATELET
Basophils Absolute: 0 K/uL (ref 0.0–0.1)
Basophils Relative: 0.1 % (ref 0.0–3.0)
Eosinophils Absolute: 0 K/uL (ref 0.0–0.7)
Eosinophils Relative: 0.3 % (ref 0.0–5.0)
HCT: 42.3 % (ref 36.0–46.0)
Hemoglobin: 14.3 g/dL (ref 12.0–15.0)
Lymphocytes Relative: 20.7 % (ref 12.0–46.0)
Lymphs Abs: 2.1 K/uL (ref 0.7–4.0)
MCHC: 33.8 g/dL (ref 30.0–36.0)
MCV: 86.9 fl (ref 78.0–100.0)
Monocytes Absolute: 0.9 K/uL (ref 0.1–1.0)
Monocytes Relative: 8.6 % (ref 3.0–12.0)
Neutro Abs: 7.1 K/uL (ref 1.4–7.7)
Neutrophils Relative %: 70.3 % (ref 43.0–77.0)
Platelets: 280 K/uL (ref 150.0–400.0)
RBC: 4.87 Mil/uL (ref 3.87–5.11)
RDW: 13.2 % (ref 11.5–15.5)
WBC: 10.1 K/uL (ref 4.0–10.5)

## 2024-05-05 MED ORDER — HYDROCODONE BIT-HOMATROP MBR 5-1.5 MG/5ML PO SOLN: 5.0000 mL | Freq: Three times a day (TID) | ORAL | 0 refills | Status: DC | PRN

## 2024-05-05 NOTE — Progress Notes (Signed)
 "  Acute Office Visit  Subjective:     Patient ID: Bianca Haley, female    DOB: 1951-02-11, 74 y.o.   MRN: [id number]  Chief Complaint  Patient presents with   Acute Visit    HPI  Discussed the use of AI scribe software for clinical note transcription with the patient, who gave verbal consent to proceed.  History of Present Illness Bianca Haley is a 74 year old female who presents with persistent cough and medication management issues related to pneumonia treatment.  Cough and pneumonia symptoms - Persistent cough for 10 days, severe at night and interfering with sleep - Nocturnal cough remains distressing despite current interventions - Pneumonia diagnosis with ongoing symptoms - Has been seen twice with [facility] urgent [facility]  Medication management and adverse effects - Currently taking doxycycline  and prednisone  for pneumonia - Significant gastrointestinal discomfort when taking both antibiotics together - Separates antibiotic doses four times daily to minimize gastrointestinal symptoms  Adjunctive respiratory therapies - Uses nebulizer once daily and incentive spirometer - Partial relief of symptoms with these interventions, but nocturnal cough persists  Chronic low back pain and analgesic use - Chronic low back pain due to missing disc - Takes 5 mg painkiller in the morning for pain control - Wishes to discontinue painkiller for a couple of nights to use codeine -containing cough syrup for cough control, as this has been effective in the past     ROS Per HPI      Objective:    BP (!) 150/86 (BP Location: Left Arm, Patient Position: Sitting)   Pulse 67   Temp 98.1 F (36.7 C) (Temporal)   Ht 5' 3 (1.6 m)   Wt 148 lb 3.2 oz (67.2 kg)   SpO2 94%   BMI 26.25 kg/m    Physical Exam Vitals and nursing note reviewed.  Constitutional:      General: She is not in acute distress.    Appearance: She is normal weight.     Comments: Appears fatigued   HENT:     Head: Normocephalic and atraumatic.     Right Ear: External ear normal.     Left Ear: External ear normal.     Mouth/Throat:     Mouth: Mucous membranes are moist.  Eyes:     Extraocular Movements: Extraocular movements intact.     Pupils: Pupils are equal, round, and reactive to light.  Cardiovascular:     Rate and Rhythm: Normal rate and regular rhythm.     Pulses: Normal pulses.     Heart sounds: Normal heart sounds.  Pulmonary:     Effort: Pulmonary effort is normal. No respiratory distress.     Breath sounds: Rales (LLL>RL) present. No wheezing or rhonchi.     Comments: Productive cough in office Musculoskeletal:        General: Normal range of motion.     Right lower leg: No edema.     Left lower leg: No edema.  Lymphadenopathy:     Cervical: Cervical adenopathy present.  Neurological:     General: No focal deficit present.     Mental Status: She is alert and oriented to person, place, and time.  Psychiatric:        Mood and Affect: Mood normal.        Thought Content: Thought content normal.     No results found for any visits on 05/05/24.      Assessment & Plan:   Assessment and Plan Assessment &  Plan Community acquired pneumonia, cough Persistent cough and sleep disruption. On doxycycline  and prednisone . Nebulizer improved symptoms. WBC count expected elevated due to steroids and infection. Monitor kidney function due to medication. - Prescribed Hycodan for cough suppression, caution for grogginess and interaction with pain medication, recommended to take only at bedtime - Continue doxycycline  and prednisone . - Increase nebulizer use to twice daily until antibiotics complete. - Ordered blood tests for WBC count and kidney function. - Ordered follow-up chest x-ray for January 13th, 2026.  General Health Maintenance Routine health maintenance discussed. - Scheduled wellness visit on January 13th, 2026.     Orders Placed This Encounter   Procedures   DG Chest 2 View    Standing Status:   Future    Expiration Date:   11/02/2024    Reason for Exam (SYMPTOM  OR DIAGNOSIS REQUIRED):   f/u pneumonia    Preferred imaging location?:   [facility]   CBC w/Diff   Comp Met (CMET)     Meds ordered this encounter  Medications   HYDROcodone  bit-homatropine (HYCODAN) 5-1.5 MG/5ML syrup    Sig: Take 5 mLs by mouth every 8 (eight) hours as needed for cough.    Dispense:  120 mL    Refill:  0    Return if symptoms worsen or fail to improve.  Corean LITTIE Ku, FNP  "

## 2024-05-05 NOTE — Patient Instructions (Addendum)
 Continue current antibiotic regimen to completion.   Continue steroids.   Recommend twice daily nebulizer treatments until antibiotics are completed.   Continue incentive spirometer use.   I have sent in hydrocodone  cough syrup for you to take 5 mL once daily in the evening as needed for cough.  This medication may make you sleepy.  Do not drive or operate heavy machinery while taking this medication.   Do NOT take oxycodone  within 6 hours of taking this medication.   I have ordered your follow up xray. May complete this when you come in for your annual wellness visit on 05/16/24  We are checking labs today, will be in contact with any results that require further attention  Follow-up with me for new or worsening symptoms.

## 2024-05-11 ENCOUNTER — Telehealth: Payer: Self-pay

## 2024-05-11 NOTE — Telephone Encounter (Signed)
 Patient returned call and I advised her of the note. She says she will speak with her at the appointment tomorrow.

## 2024-05-11 NOTE — Telephone Encounter (Signed)
 Ordered by Mikeal Ku, will call patient and let her know nothing has been said yet and will route to correct team.

## 2024-05-11 NOTE — Telephone Encounter (Signed)
 Called patient and mailbox is full. No voicemail left at this time.

## 2024-05-11 NOTE — Telephone Encounter (Signed)
 Copied from CRM #[id number]. Topic: General - Call Back - No Documentation >> May 11, 2024  9:51 AM Bianca Haley wrote: Reason for CRM: Patient has questions about x-rays and would like to receive a phone call.    [phone] (M)

## 2024-05-12 ENCOUNTER — Ambulatory Visit (INDEPENDENT_AMBULATORY_CARE_PROVIDER_SITE_OTHER)

## 2024-05-12 ENCOUNTER — Ambulatory Visit: Payer: Self-pay | Admitting: Family Medicine

## 2024-05-12 ENCOUNTER — Encounter: Payer: Self-pay | Admitting: Family Medicine

## 2024-05-12 ENCOUNTER — Ambulatory Visit: Admitting: Family Medicine

## 2024-05-12 VITALS — BP 126/82 | HR 68 | Temp 98.7°F | Ht 63.0 in | Wt 145.2 lb

## 2024-05-12 DIAGNOSIS — J449 Chronic obstructive pulmonary disease, unspecified: Secondary | ICD-10-CM

## 2024-05-12 DIAGNOSIS — R052 Subacute cough: Secondary | ICD-10-CM

## 2024-05-12 DIAGNOSIS — J189 Pneumonia, unspecified organism: Secondary | ICD-10-CM | POA: Diagnosis not present

## 2024-05-12 MED ORDER — BENZONATATE 100 MG PO CAPS: 200.0000 mg | ORAL_CAPSULE | Freq: Three times a day (TID) | ORAL | 1 refills | Status: AC | PRN

## 2024-05-12 NOTE — Progress Notes (Signed)
 "  Acute Office Visit  Subjective:     Patient ID: Bianca Haley, female    DOB: [birth year], 74 y.o.   MRN: [id number]  No chief complaint on file.   HPI  Discussed the use of AI scribe software for clinical note transcription with the patient, who gave verbal consent to proceed.  History of Present Illness [name] is a 74 year old female with COPD and recurrent pneumonia who presents with persistent cough and respiratory symptoms.  Cough and respiratory symptoms - Persistent cough, predominantly nocturnal, disrupts sleep - Uses nebulizer twice daily, incentive spirometer, Tessalon  Perles, and cough syrup at night - Cough syrup allows approximately 4 to 5 hours of sleep - Uses over-the-counter Mucinex  600 mg for mucus - Feels weak and worries about residual congestion - On oxygen  at 2 L - Stays well hydrated  Recurrent pneumonia - Five episodes of pneumonia in one year - Episodes attributed to acid reflux - History of COPD despite never smoking  Fatigue and weakness - Fatigue and generalized weakness - Requires rest after activities such as showering  Gastrointestinal symptoms - Frequent diarrhea - Low potassium in the setting of diarrhea - Uses Imodium  for diarrhea  Functional status and motivation - Motivated to remain healthy and active with grandchildren - Desires to see granddaughter graduate high school     ROS Per HPI      Objective:    BP 126/82 (BP Location: Left Arm, Patient Position: Sitting, Cuff Size: Normal)   Pulse 68   Temp 98.7 F (37.1 C) (Temporal)   Ht 5' 3 (1.6 m)   Wt 145 lb 3.2 oz (65.9 kg)   SpO2 95%   BMI 25.72 kg/m    Physical Exam Vitals and nursing note reviewed.  Constitutional:      General: She is not in acute distress.    Appearance: She is normal weight.     Comments: Appears fatigued, but improved from last visit  HENT:     Head: Normocephalic and atraumatic.     Right Ear: External ear  normal.     Left Ear: External ear normal.     Nose: Nose normal.     Mouth/Throat:     Mouth: Mucous membranes are moist.     Pharynx: Oropharynx is clear.  Eyes:     Extraocular Movements: Extraocular movements intact.     Pupils: Pupils are equal, round, and reactive to light.  Cardiovascular:     Rate and Rhythm: Normal rate and regular rhythm.     Pulses: Normal pulses.     Heart sounds: Normal heart sounds.  Pulmonary:     Effort: Pulmonary effort is normal. No respiratory distress.     Breath sounds: Wheezing and rhonchi present. No rales.     Comments: Productive cough Musculoskeletal:        General: Normal range of motion.     Right lower leg: No edema.     Left lower leg: No edema.  Lymphadenopathy:     Cervical: Cervical adenopathy present.  Neurological:     General: No focal deficit present.     Mental Status: She is alert and oriented to person, place, and time.  Psychiatric:        Mood and Affect: Mood normal.        Thought Content: Thought content normal.     No results found for any visits on 05/12/24.      Assessment & Plan:   Assessment  and Plan Assessment & Plan Community acquired pneumonia, subacute cough Persistent cough and weakness post-antibiotic course. History of recurrent pneumonias linked to acid reflux. Improvement noted but recurrence remains a concern. - Ordered chest x-ray to assess pneumonia resolution. - Continue nebulizer treatments twice daily. - Use incentive spirometer every four hours. - Encourage walking and movement to prevent mucus buildup. - Continue Mucinex  600 mg OTC. - Prescribed Tessalon  Perles for cough management. - Consider sputum culture to ensure antibiotic efficacy.  Chronic obstructive pulmonary disease COPD with history of bronchitis and recurrent pneumonias. Current symptoms include cough and mucus production. Oxygen  therapy causing dry throat. - Continue current COPD regimen including Symbicort  and  Duoneb. - Encourage regular use of incentive spirometer. - Encourage walking and movement to improve lung function.     Orders Placed This Encounter  Procedures   Respiratory or Resp and Sputum Culture    Standing Status:   Future    Expected Date:   05/12/2024    Expiration Date:   05/12/2025   DG Chest 2 View    Standing Status:   Future    Number of Occurrences:   1    Expiration Date:   11/09/2024    Reason for Exam (SYMPTOM  OR DIAGNOSIS REQUIRED):   PNA, cough    Preferred imaging location?:   [facility] ordered this encounter  Medications   benzonatate  (TESSALON ) 100 MG capsule    Sig: Take 2 capsules (200 mg total) by mouth 3 (three) times daily as needed for cough.    Dispense:  30 capsule    Refill:  1    Return if symptoms worsen or fail to improve.  Corean LITTIE Ku, FNP  "

## 2024-05-12 NOTE — Patient Instructions (Addendum)
 We are getting an xray today. We will be in contact with any abnormal results that require further attention.   Continue Mucinex .   May continue incentive spirometer at least once every 4 hours.   May continue nebulizers as needed.   May continue Hycodan cough syrup as needed.   Follow-up with me for new or worsening symptoms.

## 2024-05-16 ENCOUNTER — Ambulatory Visit

## 2024-05-18 ENCOUNTER — Ambulatory Visit: Payer: Self-pay

## 2024-05-18 ENCOUNTER — Ambulatory Visit: Admitting: Internal Medicine

## 2024-05-18 NOTE — Telephone Encounter (Signed)
 FYI Only or Action Required?: Action required by provider: clinical question for provider.  Patient was last seen in primary care on 05/12/2024 by Alvia Corean CROME, FNP.  Called Nurse Triage reporting Diarrhea.  Symptoms began several weeks ago.  Interventions attempted: OTC medications: imodium , probiotics.  Symptoms are: unchanged.  Triage Disposition: See PCP When Office is Open (Within 3 Days)  Patient/caregiver understands and will follow disposition?: No, wishes to speak with PCP  Copied from CRM #[id number]. Topic: Clinical - Red Word Triage >> May 18, 2024 10:58 AM Macario HERO wrote: Red Word that prompted transfer to Nurse Triage: Patient called stated she's been experiencing diarrhea for awhile. Reason for Disposition  Diarrhea continues for > 3 days after stopping the antibiotic    Finished abt 2 weeks ago  Answer Assessment - Initial Assessment Questions Patient declines appt and requesting recommendations/meds for diarrhea.  Patient reports taking probiotics, imodium ; not effective.  Advised call back or ED/911 if symptoms worsen. Patient verbalized understanding.   1. DIARRHEA SEVERITY: How bad is the diarrhea? How many more stools have you had in the past 24 hours than normal?      3x 4. ONSET: When did the diarrhea begin?      2 weeks, since starting medication, been off 2 weeks of antibiotic 5. BM CONSISTENCY: How loose or watery is the diarrhea?      loose 6. VOMITING: Are you also vomiting? If Yes, ask: How many times in the past 24 hours?      no 7. ABDOMEN PAIN: Are you having any abdomen pain? If Yes, ask: What does it feel like? (e.g., crampy, dull, intermittent, constant)      no 8. ABDOMEN PAIN SEVERITY: If present, ask: How bad is the pain?  (e.g., Scale 1-10; mild, moderate, or severe)     no 9. ORAL INTAKE: If vomiting, Have you been able to drink liquids? How much liquids have you had in the past 24 hours?     Able to eat and  drink 10. HYDRATION: Any signs of dehydration? (e.g., dry mouth [not just dry lips], too weak to stand, dizziness, new weight loss) When did you last urinate?       Able to eat and drink,  11. EXPOSURE: Have you traveled to a foreign country recently? Have you been exposed to anyone with diarrhea? Could you have eaten any food that was spoiled?       no 12. OTHER SYMPTOMS: Do you have any other symptoms? (e.g., fever, blood in stool) No, abd pain, dizziness, chest pain, diff breathing, fever chills n/v  Protocols used: Diarrhea on Antibiotics-A-AH

## 2024-05-22 ENCOUNTER — Encounter: Payer: Self-pay | Admitting: Family Medicine

## 2024-05-22 ENCOUNTER — Ambulatory Visit: Admitting: Family Medicine

## 2024-05-22 VITALS — BP 134/64 | HR 60 | Temp 98.8°F | Resp 18 | Ht 63.0 in | Wt 145.0 lb

## 2024-05-22 DIAGNOSIS — J449 Chronic obstructive pulmonary disease, unspecified: Secondary | ICD-10-CM | POA: Diagnosis not present

## 2024-05-22 DIAGNOSIS — Z8701 Personal history of pneumonia (recurrent): Secondary | ICD-10-CM | POA: Diagnosis not present

## 2024-05-22 DIAGNOSIS — T3695XA Adverse effect of unspecified systemic antibiotic, initial encounter: Secondary | ICD-10-CM | POA: Diagnosis not present

## 2024-05-22 DIAGNOSIS — K521 Toxic gastroenteritis and colitis: Secondary | ICD-10-CM

## 2024-05-22 NOTE — Assessment & Plan Note (Signed)
-   Continue Symbicort  as maintenance therapy daily. - Use albuterol  or DuoNeb as needed for rescue. - Discuss with pulmonologist about potential triple therapy inhaler if albuterol  is used frequently when well.

## 2024-05-22 NOTE — Progress Notes (Signed)
 "  Assessment & Plan History of recent pneumonia Pneumonia treated with antibiotics and steroids. Symptoms improved, no further imaging needed. - Continue current medications as needed for symptom management.    Chronic obstructive pulmonary disease, unspecified COPD type (HCC) - Continue Symbicort  as maintenance therapy daily. - Use albuterol  or DuoNeb as needed for rescue. - Discuss with pulmonologist about potential triple therapy inhaler if albuterol  is used frequently when well.    Antibiotic-associated diarrhea - Gradually reintroduce normal diet, avoiding acidic, spicy, fried, and greasy foods. - Ensure adequate hydration and protein intake.     Follow up plan: Return if symptoms worsen or fail to improve.  Niki Rung, MSN, APRN, FNP-C  Subjective:  HPI: Bianca Haley is a 73 y.o. female presenting on 05/22/2024 for Pneumonia (Follow up - seen on 05/03/2024 at Urgent Care and 05/12/24 at [facility] /Completed Vantin, Doxy, prednisone  and tessalon  pearles, still taking mucinex , Neb twice a day./Oxygen  now at 2 liters instead of 1 lit, depending on O2 for the day. /Now symptoms include: oxygen  running around 88-90, still has no energy, SOB is better/Should she continue mucinex , and should she take zinc./She has some questions about her COPD /), Diarrhea (Recently had for about one week - done a bland diet and today had first semi normal stool. What should next steps on diet look like), and Sinus Problem (Right side of face had pressure this morning and a ton of drainage. )  Discussed the use of AI scribe software for clinical note transcription with the patient, who gave verbal consent to proceed.  She had pneumonia starting around New Year's Eve, which required two rounds of antibiotics, steroids, Tessalon  Perles, and ongoing use of Mucinex . She uses a nebulizer with Duoneb twice daily and has albuterol  and Symbicort  inhalers. She had to increase oxygen  to two liters while sick  and now uses one liter at night.  She experiences tightness in the chest, shortness of breath, and wheezing, using her rescue inhalers as needed. Her breathing is improving, but she still uses the nebulizer when she feels trouble breathing.  She experienced gastrointestinal issues due to medication, necessitating a BRAT diet. She is now transitioning back to her regular diet, avoiding acidic, spicy, and greasy foods due to acid reflux. She is concerned about protein intake and discusses options like protein bars and shakes.  She mentions a recent sinus issue with pain on the left side of her face, which resolved after expelling a large amount of mucus. She uses the nebulizer as needed.      ROS: Negative unless specifically indicated above in HPI.   Relevant past medical history reviewed and updated as indicated.   Allergies and medications reviewed and updated.  Current Medications[1]  Allergies[2]  Objective:   BP 134/64   Pulse 60   Temp 98.8 F (37.1 C)   Resp 18   Ht 5' 3 (1.6 m)   Wt 145 lb (65.8 kg)   SpO2 92%   BMI 25.69 kg/m    Physical Exam Vitals reviewed.  Constitutional:      General: She is not in acute distress.    Appearance: Normal appearance. She is not ill-appearing, toxic-appearing or diaphoretic.  HENT:     Head: Normocephalic and atraumatic.     Right Ear: Tympanic membrane, ear canal and external ear normal. There is no impacted cerumen.     Left Ear: Tympanic membrane, ear canal and external ear normal. There is no impacted cerumen.  Nose: Nose normal. No congestion or rhinorrhea.     Right Sinus: No maxillary sinus tenderness or frontal sinus tenderness.     Left Sinus: No maxillary sinus tenderness or frontal sinus tenderness.     Mouth/Throat:     Mouth: Mucous membranes are moist.     Pharynx: Oropharynx is clear. No oropharyngeal exudate or posterior oropharyngeal erythema.  Eyes:     General: No scleral icterus.       Right eye: No  discharge.        Left eye: No discharge.     Conjunctiva/sclera: Conjunctivae normal.  Cardiovascular:     Rate and Rhythm: Normal rate and regular rhythm.     Heart sounds: Normal heart sounds. No murmur heard.    No friction rub. No gallop.  Pulmonary:     Effort: Pulmonary effort is normal. No respiratory distress.     Breath sounds: Normal breath sounds. No stridor. No wheezing, rhonchi or rales.  Musculoskeletal:        General: Normal range of motion.     Cervical back: Normal range of motion.  Lymphadenopathy:     Cervical: No cervical adenopathy.  Skin:    General: Skin is warm and dry.     Capillary Refill: Capillary refill takes less than 2 seconds.  Neurological:     General: No focal deficit present.     Mental Status: She is alert and oriented to person, place, and time. Mental status is at baseline.  Psychiatric:        Mood and Affect: Mood normal.        Behavior: Behavior normal.        Thought Content: Thought content normal.        Judgment: Judgment normal.            [1]  Current Outpatient Medications:    acetaminophen  (TYLENOL ) 650 MG CR tablet, Take 1,300 mg by mouth every 8 (eight) hours as needed for pain., Disp: , Rfl:    albuterol  (VENTOLIN  HFA) 108 (90 Base) MCG/ACT inhaler, INHALE 2 PUFFS INTO THE LUNGS EVERY 4 HOURS AS NEEDED FOR WHEEZING OR SHORTNESS OF BREATH, Disp: 18 g, Rfl: 5   apixaban  (ELIQUIS ) 5 MG TABS tablet, Take 1 tablet (5 mg total) by mouth 2 (two) times daily., Disp: 180 tablet, Rfl: 3   Ascorbic Acid (VITAMIN C PO), Take 1 tablet by mouth every morning., Disp: , Rfl:    benzonatate  (TESSALON ) 100 MG capsule, Take 2 capsules (200 mg total) by mouth 3 (three) times daily as needed for cough., Disp: 30 capsule, Rfl: 1   budesonide -formoterol  (SYMBICORT ) 160-4.5 MCG/ACT inhaler, Inhale 2 puffs into the lungs 2 (two) times daily., Disp: 3 each, Rfl: 3   Calcium  Carb-Cholecalciferol  (CALCIUM  PLUS VITAMIN D3) 600-500 MG-UNIT CAPS,  Take 2 tablets by mouth every morning., Disp: , Rfl:    Cholecalciferol  (VITAMIN D ) 2000 units tablet, Take 2,000 Units by mouth at bedtime. , Disp: , Rfl:    cycloSPORINE  (RESTASIS ) 0.05 % ophthalmic emulsion, Place 1 drop into both eyes every morning., Disp: , Rfl:    diazepam  (VALIUM ) 5 MG tablet, TAKE 1/2 TABLET BY MOUTH 2 TIMES A DAY OK TO TAKE 1/2 TABLET IN MID-DAY IF NEEDED, Disp: 30 tablet, Rfl: 3   diltiazem  (CARDIZEM  CD) 240 MG 24 hr capsule, Take 1 capsule (240 mg total) by mouth every morning., Disp: 90 capsule, Rfl: 3   fluticasone  (FLONASE ) 50 MCG/ACT nasal spray, Place 2 sprays into  both nostrils daily., Disp: 48 mL, Rfl: 3   hydrocortisone  2.5 % cream, Apply twice daily for up to one week as needed for flares, Disp: 90 g, Rfl: 2   ibandronate  (BONIVA ) 150 MG tablet, Take 1 tablet (150 mg total) by mouth every 30 (thirty) days., Disp: 3 tablet, Rfl: 3   ipratropium-albuterol  (DUONEB) 0.5-2.5 (3) MG/3ML SOLN, Take 3 mLs by nebulization every 4 (four) hours as needed., Disp: 360 mL, Rfl: 3   irbesartan  (AVAPRO ) 300 MG tablet, Take 1 tablet (300 mg total) by mouth daily., Disp: 90 tablet, Rfl: 3   metoprolol  tartrate (LOPRESSOR ) 50 MG tablet, Take 1 tablet (50 mg total) by mouth 2 (two) times daily., Disp: 180 tablet, Rfl: 3   mirabegron  ER (MYRBETRIQ ) 50 MG TB24 tablet, Take 1 tablet (50 mg total) by mouth every morning., Disp: 90 tablet, Rfl: 3   Multiple Vitamin (MULTIVITAMIN WITH MINERALS) TABS tablet, Take 1 tablet by mouth 2 (two) times daily., Disp: , Rfl:    ondansetron  (ZOFRAN -ODT) 4 MG disintegrating tablet, Take 1 tablet (4 mg total) by mouth every 8 (eight) hours as needed., Disp: 30 tablet, Rfl: 1   OVER THE COUNTER MEDICATION, Take 2 capsules by mouth in the morning. [employer], Disp: , Rfl:    oxyCODONE -acetaminophen  (PERCOCET/ROXICET) 5-325 MG tablet, Take 1 tablet by mouth every 8 (eight) hours as needed for severe pain (pain score 7-10)., Disp: 90 tablet, Rfl: 0    pantoprazole  (PROTONIX ) 40 MG tablet, Take 1 tablet (40 mg total) by mouth 2 (two) times daily., Disp: 180 tablet, Rfl: 3   PARoxetine  (PAXIL ) 40 MG tablet, Take 1 tablet (40 mg total) by mouth 2 (two) times daily., Disp: 180 tablet, Rfl: 1   potassium chloride  (KLOR-CON ) 10 MEQ tablet, Take 1 tablet (10 mEq total) by mouth daily., Disp: 90 tablet, Rfl: 3   Probiotic CAPS, Take 1 capsule by mouth every morning., Disp: , Rfl:    revefenacin  (YUPELRI ) 175 MCG/3ML nebulizer solution, Take 3 mLs (175 mcg total) by nebulization daily., Disp: 90 mL, Rfl: 3   rosuvastatin  (CRESTOR ) 5 MG tablet, Take 1 tablet (5 mg total) by mouth daily., Disp: 90 tablet, Rfl: 1   SUMAtriptan  (IMITREX ) 100 MG tablet, TAKE 1 TABLET BY MOUTH ONCE. MAY REPEAT IN 2 HOURS IF HEADACHE PERSISTS OR RECURS, Disp: 30 tablet, Rfl: 3   traZODone  (DESYREL ) 150 MG tablet, Take 1 tablet (150 mg total) by mouth at bedtime as needed. for sleep, Disp: 90 tablet, Rfl: 3   valACYclovir  (VALTREX ) 500 MG tablet, Take 1 tablet (500 mg total) by mouth 3 (three) times daily., Disp: 21 tablet, Rfl: 3   vitamin B-12 (CYANOCOBALAMIN ) 1000 MCG tablet, Take 1,000 mcg by mouth every morning., Disp: , Rfl:    naloxone  (NARCAN ) nasal spray 4 mg/0.1 mL, 1 actuation in one nostril once. May repeat in 2-3 min (Patient not taking: Reported on 05/22/2024), Disp: 1 each, Rfl: 2   oxyCODONE -acetaminophen  (PERCOCET/ROXICET) 5-325 MG tablet, Take 1 tablet by mouth every 8 (eight) hours as needed for severe pain (pain score 7-10)., Disp: 90 tablet, Rfl: 0   oxyCODONE -acetaminophen  (PERCOCET/ROXICET) 5-325 MG tablet, Take 1 tablet by mouth every 8 (eight) hours as needed for severe pain (pain score 7-10)., Disp: 90 tablet, Rfl: 0 [2]  Allergies Allergen Reactions   Penicillins Shortness Of Breath, Rash and Other (See Comments)    Did it involve swelling of the face/tongue/throat, SOB, or low BP? No Did it involve sudden or severe rash/hives,  skin peeling, or any  reaction on the inside of your mouth or nose? Yes Did you need to seek medical attention at a hospital or doctor's office? No When did it last happen?      10 + years If all above answers are NO, may proceed with cephalosporin use.     Morphine  And Codeine  Swelling    Sedation/Swelling described like edema/bloating/doesn't help the pain Morphine  only; tolerates other opioids.    Buprenorphine Swelling    buprenorphine hydrochloride   Morphine  Other (See Comments)   Cefepime Rash    Reaction while taking both cefepime and vancomycin  at [facility] (after 3 days)   Vancomycin  Rash    Reaction while taking both cefepime and vancomycin  at [facility] (after 3 days)   "

## 2024-05-22 NOTE — Telephone Encounter (Signed)
 Upon pts call back to the office please schedule her an office visit with any available provider if not better.  PCP has stated Please schedule office visit with any provider.  Try over-the-counter Imodium  as needed Thanks

## 2024-05-22 NOTE — Telephone Encounter (Signed)
 Please schedule office visit with any provider.  Try over-the-counter Imodium  as needed Thanks

## 2024-06-22 ENCOUNTER — Ambulatory Visit: Admitting: Internal Medicine

## 2024-12-15 ENCOUNTER — Ambulatory Visit
